# Patient Record
Sex: Female | Born: 1935 | Race: White | Hispanic: No | Smoking: Former smoker
Health system: Southern US, Community
[De-identification: ages and names within clinical notes are randomized; demographics above are authoritative.]

## PROBLEM LIST (undated history)

## (undated) DIAGNOSIS — E785 Hyperlipidemia, unspecified: Secondary | ICD-10-CM

## (undated) DIAGNOSIS — H409 Unspecified glaucoma: Secondary | ICD-10-CM

## (undated) DIAGNOSIS — H3530 Unspecified macular degeneration: Secondary | ICD-10-CM

## (undated) DIAGNOSIS — M1990 Unspecified osteoarthritis, unspecified site: Secondary | ICD-10-CM

## (undated) DIAGNOSIS — I5032 Chronic diastolic (congestive) heart failure: Secondary | ICD-10-CM

## (undated) DIAGNOSIS — M545 Low back pain, unspecified: Secondary | ICD-10-CM

## (undated) DIAGNOSIS — I2510 Atherosclerotic heart disease of native coronary artery without angina pectoris: Secondary | ICD-10-CM

## (undated) DIAGNOSIS — I10 Essential (primary) hypertension: Secondary | ICD-10-CM

## (undated) DIAGNOSIS — R0602 Shortness of breath: Secondary | ICD-10-CM

## (undated) DIAGNOSIS — K5792 Diverticulitis of intestine, part unspecified, without perforation or abscess without bleeding: Secondary | ICD-10-CM

## (undated) DIAGNOSIS — Z9221 Personal history of antineoplastic chemotherapy: Secondary | ICD-10-CM

## (undated) DIAGNOSIS — J449 Chronic obstructive pulmonary disease, unspecified: Secondary | ICD-10-CM

## (undated) DIAGNOSIS — K5790 Diverticulosis of intestine, part unspecified, without perforation or abscess without bleeding: Secondary | ICD-10-CM

## (undated) DIAGNOSIS — H269 Unspecified cataract: Secondary | ICD-10-CM

## (undated) DIAGNOSIS — E039 Hypothyroidism, unspecified: Secondary | ICD-10-CM

## (undated) DIAGNOSIS — I509 Heart failure, unspecified: Secondary | ICD-10-CM

## (undated) DIAGNOSIS — Z923 Personal history of irradiation: Secondary | ICD-10-CM

## (undated) DIAGNOSIS — F419 Anxiety disorder, unspecified: Secondary | ICD-10-CM

## (undated) DIAGNOSIS — R079 Chest pain, unspecified: Secondary | ICD-10-CM

## (undated) DIAGNOSIS — K219 Gastro-esophageal reflux disease without esophagitis: Secondary | ICD-10-CM

## (undated) DIAGNOSIS — G8929 Other chronic pain: Secondary | ICD-10-CM

## (undated) DIAGNOSIS — J189 Pneumonia, unspecified organism: Secondary | ICD-10-CM

## (undated) DIAGNOSIS — C50919 Malignant neoplasm of unspecified site of unspecified female breast: Secondary | ICD-10-CM

## (undated) DIAGNOSIS — Z8719 Personal history of other diseases of the digestive system: Secondary | ICD-10-CM

## (undated) DIAGNOSIS — Z9889 Other specified postprocedural states: Secondary | ICD-10-CM

## (undated) HISTORY — DX: Personal history of other diseases of the digestive system: Z87.19

## (undated) HISTORY — DX: Diverticulosis of intestine, part unspecified, without perforation or abscess without bleeding: K57.90

## (undated) HISTORY — DX: Personal history of irradiation: Z92.3

## (undated) HISTORY — PX: COLOSTOMY: SHX63

## (undated) HISTORY — PX: BILATERAL OOPHORECTOMY: SHX1221

## (undated) HISTORY — PX: CATARACT EXTRACTION W/ INTRAOCULAR LENS  IMPLANT, BILATERAL: SHX1307

## (undated) HISTORY — PX: TONSILLECTOMY: SUR1361

## (undated) HISTORY — PX: SHOULDER OPEN ROTATOR CUFF REPAIR: SHX2407

## (undated) HISTORY — DX: Unspecified osteoarthritis, unspecified site: M19.90

## (undated) HISTORY — DX: Diverticulitis of intestine, part unspecified, without perforation or abscess without bleeding: K57.92

## (undated) HISTORY — DX: Other specified postprocedural states: Z98.890

## (undated) HISTORY — DX: Essential (primary) hypertension: I10

## (undated) HISTORY — DX: Heart failure, unspecified: I50.9

## (undated) HISTORY — DX: Shortness of breath: R06.02

## (undated) HISTORY — DX: Unspecified glaucoma: H40.9

## (undated) HISTORY — PX: UPPER GASTROINTESTINAL ENDOSCOPY: SHX188

## (undated) HISTORY — DX: Unspecified cataract: H26.9

## (undated) HISTORY — DX: Hyperlipidemia, unspecified: E78.5

## (undated) HISTORY — PX: THYROIDECTOMY, PARTIAL: SHX18

## (undated) HISTORY — DX: Chronic diastolic (congestive) heart failure: I50.32

## (undated) HISTORY — PX: NERVE SURGERY: SHX1016

## (undated) HISTORY — PX: CATARACT EXTRACTION: SUR2

## (undated) HISTORY — DX: Atherosclerotic heart disease of native coronary artery without angina pectoris: I25.10

---

## 2003-10-01 ENCOUNTER — Observation Stay (HOSPITAL_COMMUNITY): Admission: RE | Admit: 2003-10-01 | Discharge: 2003-10-02 | Payer: Self-pay | Admitting: Orthopedic Surgery

## 2004-01-21 ENCOUNTER — Other Ambulatory Visit: Admission: RE | Admit: 2004-01-21 | Discharge: 2004-01-21 | Payer: Self-pay | Admitting: Obstetrics and Gynecology

## 2004-02-03 ENCOUNTER — Encounter: Admission: RE | Admit: 2004-02-03 | Discharge: 2004-02-03 | Payer: Self-pay | Admitting: Obstetrics and Gynecology

## 2004-03-15 ENCOUNTER — Observation Stay (HOSPITAL_COMMUNITY): Admission: RE | Admit: 2004-03-15 | Discharge: 2004-03-16 | Payer: Self-pay | Admitting: Obstetrics and Gynecology

## 2004-03-15 ENCOUNTER — Encounter (INDEPENDENT_AMBULATORY_CARE_PROVIDER_SITE_OTHER): Payer: Self-pay | Admitting: *Deleted

## 2005-01-20 ENCOUNTER — Encounter: Admission: RE | Admit: 2005-01-20 | Discharge: 2005-01-20 | Payer: Self-pay | Admitting: Gastroenterology

## 2005-01-26 ENCOUNTER — Encounter: Admission: RE | Admit: 2005-01-26 | Discharge: 2005-01-26 | Payer: Self-pay | Admitting: Gastroenterology

## 2005-04-10 ENCOUNTER — Other Ambulatory Visit: Admission: RE | Admit: 2005-04-10 | Discharge: 2005-04-10 | Payer: Self-pay | Admitting: Family Medicine

## 2005-08-30 ENCOUNTER — Ambulatory Visit (HOSPITAL_COMMUNITY): Admission: RE | Admit: 2005-08-30 | Discharge: 2005-08-30 | Payer: Self-pay | Admitting: Family Medicine

## 2005-11-11 ENCOUNTER — Emergency Department (HOSPITAL_COMMUNITY): Admission: EM | Admit: 2005-11-11 | Discharge: 2005-11-11 | Payer: Self-pay | Admitting: Emergency Medicine

## 2007-01-16 ENCOUNTER — Emergency Department (HOSPITAL_COMMUNITY): Admission: EM | Admit: 2007-01-16 | Discharge: 2007-01-16 | Payer: Self-pay | Admitting: Emergency Medicine

## 2007-02-28 ENCOUNTER — Ambulatory Visit: Payer: Self-pay | Admitting: Vascular Surgery

## 2007-02-28 ENCOUNTER — Ambulatory Visit (HOSPITAL_COMMUNITY): Admission: RE | Admit: 2007-02-28 | Discharge: 2007-02-28 | Payer: Self-pay | Admitting: Orthopedic Surgery

## 2008-01-23 ENCOUNTER — Encounter: Admission: RE | Admit: 2008-01-23 | Discharge: 2008-01-23 | Payer: Self-pay | Admitting: Gastroenterology

## 2008-09-10 ENCOUNTER — Encounter: Admission: RE | Admit: 2008-09-10 | Discharge: 2008-09-10 | Payer: Self-pay | Admitting: Neurology

## 2009-09-29 ENCOUNTER — Encounter: Admission: RE | Admit: 2009-09-29 | Discharge: 2009-09-29 | Payer: Self-pay | Admitting: Family Medicine

## 2010-01-07 ENCOUNTER — Encounter: Admission: RE | Admit: 2010-01-07 | Discharge: 2010-01-07 | Payer: Self-pay | Admitting: Family Medicine

## 2010-02-24 ENCOUNTER — Encounter: Admission: RE | Admit: 2010-02-24 | Discharge: 2010-02-24 | Payer: Self-pay | Admitting: Family Medicine

## 2010-04-20 ENCOUNTER — Encounter
Admission: RE | Admit: 2010-04-20 | Discharge: 2010-07-19 | Payer: Self-pay | Admitting: Physical Medicine & Rehabilitation

## 2010-04-21 ENCOUNTER — Ambulatory Visit: Payer: Self-pay | Admitting: Physical Medicine & Rehabilitation

## 2010-04-29 ENCOUNTER — Encounter
Admission: RE | Admit: 2010-04-29 | Discharge: 2010-05-03 | Payer: Self-pay | Source: Home / Self Care | Attending: Anesthesiology | Admitting: Anesthesiology

## 2010-05-03 ENCOUNTER — Ambulatory Visit: Payer: Self-pay | Admitting: Anesthesiology

## 2010-09-10 ENCOUNTER — Encounter: Payer: Self-pay | Admitting: Family Medicine

## 2010-11-29 ENCOUNTER — Other Ambulatory Visit: Payer: Self-pay | Admitting: Family Medicine

## 2010-11-29 DIAGNOSIS — M545 Low back pain, unspecified: Secondary | ICD-10-CM

## 2010-12-07 ENCOUNTER — Ambulatory Visit
Admission: RE | Admit: 2010-12-07 | Discharge: 2010-12-07 | Disposition: A | Discharge status: Home / Self Care | Payer: Medicare Other | Source: Ambulatory Visit | Attending: Family Medicine | Admitting: Family Medicine

## 2010-12-07 DIAGNOSIS — M545 Low back pain, unspecified: Secondary | ICD-10-CM

## 2011-01-06 NOTE — Op Note (Signed)
NAME:  Marissa Caldwell, Marissa Caldwell                        ACCOUNT NO.:  [id number]   MEDICAL RECORD NO.:  [id number]                   PATIENT TYPE:  INP   LOCATION:  9399                                 FACILITY:  [facility]   PHYSICIAN:  Huel Cote, M.D.              DATE OF BIRTH:  1932/07/19   DATE OF PROCEDURE:  DATE OF DISCHARGE:                                 OPERATIVE REPORT   PREOPERATIVE DIAGNOSES:  Complex right adnexal mass, which is enlarging.   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   PROCEDURE:  Diagnostic laparoscopy and mini-laparotomy with bilateral  salpingo-oophorectomy, and resection of mass.   SURGEON:  Huel Cote, M.D. with Dr. Zenaida Niece, M.D.   ANESTHESIA:  General.   FINDINGS:  The left ovary and tube were normal.  The right ovary was 4.0 to  5.0 cm, with a cystic structure which is adjacent to more solid ovarian  tissue.  This entire structure was removed intact and sent for frozen  pathology as stated.   ESTIMATED BLOOD LOSS:  Was 100 mL.   URINE OUTPUT:  Was 150 mL of clear urine.   IV FLUIDS:  Was 1500 mL in the operating room.   TO PATHOLOGY:  Frozen right adnexa sent, and benign.  Left adnexa appeared  normal and was sent to permanent pathology.   DESCRIPTION OF PROCEDURE:  The patient was taken to the operating room where  general anesthesia was obtained without difficulty.  She was then prepped  and draped in the normal sterile fashion in the dorsal lithotomy position.  A speculum was placed in the vagina and the cervix was grasped with a Hulka  tenaculum to provide for uterine manipulation.  The speculum was then  removed and attention turned to the patient's abdomen, where a small  infraumbilical incision was made with the scalpel and the Veress needle  introduced into the peritoneal cavity.  Intraperitoneal  placement was  confirmed by both aspiration and injection of normal saline.  A  pneumoperitoneum was then obtained by applying CO2 gas, with a normal  opening pressure of 3 to 6.  When the pneumoperitoneum was obtained, the  Veress needle was removed, and the 10/11 trocar placed within the umbilicus  site.  The camera was then introduced with the pelvis not able to be fully  visualized, given the very deep nature of the patient's cul-de-sac.  Therefore an additional 5 mm trocar site was placed just suprapubically in  the midline.  This was placed under direct visualization, and at the 10/11  port 0.25% Marcaine used to numb the skin locally prior to introduction.  With a blunt probe then placed through the 5 mm port, the left ovary and  tube were found to be normal.  The uterus had several small fibroids, but  itself was  very normal in size.  The right adnexa had a normal tube.  There  was a normal rim of more solid-appearing ovarian tissue which was then  adjacent to a large cystic structure measuring 4.0 to 5.0 cm, and hanging  down into the cul-de-sac itself.  At this point, given that the tissue was  obviously ovarian in nature and the complex nature of the cyst  characteristics on ultrasound, necessitating an intact removal, the decision  was made to proceed with a mini-laparotomy.  The 5 mm trocar site was  removed, and a small Pfannenstiel incision was then made through that port  site.  This was then carried through to the underlying layer of fascia by  sharp dissection and Bovie cautery.  The fascia was then nicked in the  midline and the incision was extended laterally with Mayo scissors.  The  inferior aspect was grasped and dissected off the underlying rectus muscles,  and in a similar fashion in the superior aspect was grasped and dissected  off the rectus muscles.  The rectus muscles were separated in the midline  and the peritoneal cavity entered easily, given the degree of   pneumoperitoneum still present.  This incision was then extended superiorly  and inferiorly, with careful attention to avoid the bowel and bladder.  A  small Balfour retractor was then placed through the incision, and the uterus  was grasped at its right cornu with a long curved Kelly clamp.  The right  adnexa was then elevated, and the infundibular pelvic ligament was isolated  by opening the round ligament with Bovie cautery.  The infundibular ligament  was then clamped with a Heaney clamp, and the utero-ovarian ligament also  clamped with a Heaney clamp.  These were transected with Mayo scissors.  The  adnexa was thus freed and passed off to pathology.  The free pedicles were  then secured with two sutures of #0 Vicryl and suture ligature, with  excellent hemostasis noted.  Attention was then turned to the patient's left, where in a similar fashion  the infundibular pelvic ligament was isolated and crossclamped with a Heaney  clamp.  This was then transected and secured with #0 Vicryl suture ligature.  The utero-ovarian ligament was also cross-clamped with a Heaney clamp in two  bites, and transected and suture ligated with suture ligatures with this.  The adnexa then handed off for final pathology.  All pedicles were inspected  and the pelvis was irrigated, with no active bleeding noted.  All  instruments and sponges were removed from the patient's abdomen.  The sponge  count was felt to be correct.  The subfascial planes were inspected and  found to be hemostatic with small areas of oozing, controlled with Bovie  cautery.  The fascia was then closed with #0 Vicryl in a running fashion.  The skin was closed with staples.  The umbilical port site was closed with  one interrupted suture of #0 Vicryl in a deep suture, and the skin was  closed with #3-0 Vicryl subcuticular stitch.  The sponge, lap and needle  counts were correct x2. The patient was taken to the recovery room, awake, and in  stable condition.                                               Huel Cote, M.D.  KR/MEDQ  D:  03/15/2004  T:  03/15/2004  Job:  [id number]

## 2011-01-06 NOTE — Op Note (Signed)
NAME:  Marissa Caldwell, Marissa Caldwell                        ACCOUNT NO.:  [id number]   MEDICAL RECORD NO.:  [id number]                   PATIENT TYPE:  AMB   LOCATION:  DAY                                  FACILITY:  [facility]   PHYSICIAN:  Ronald Caldwell. Darrelyn Hillock, M.D.             DATE OF BIRTH:  September 09, 1931   DATE OF PROCEDURE:  10/01/2003  DATE OF DISCHARGE:                                 OPERATIVE REPORT   SURGEON:  Georges Lynch. Darrelyn Hillock, M.D.   ASSISTANT:  Ebbie Ridge. Paitsel, P.Caldwell.   PREOPERATIVE DIAGNOSES:  Complete tear of the rotator cuff tendon on the  right with severe impingement syndrome on the right.   POSTOPERATIVE DIAGNOSES:  Complete tear of the rotator cuff tendon on the  right with severe impingement syndrome on the right.   OPERATION:  1. Partial acromionectomy and acromioplasty on the right shoulder.  2. Repair of the rotator cuff tendon utilizing the tissue mend graft for     repair of the tendon. Also utilized tissue mend graft for repair of the     proximal deltoid tendon.   The patient was taken back under general anesthesia as far as the procedure.  She first had an interscalene neck block. After the interscalene block she  was given Caldwell general anesthetic and routine orthopedic prep and drape of the  right shoulder was carried out. She was given 1 g of IV Ancef. An incision  was made over the acromion, carried down into the proximal deltoid. The  deltoid tendon was stripped from the acromion in the usual fashion. I then  protected the cuff and went down and did Caldwell partial acromionectomy and  acromioplasty in the usual fashion. I then excised the subdeltoid bursa. I  went down and noted she had Caldwell large central type tear of the rotator cuff. I  then utilized the bur to bur the undersurface of the cuff, that is not the  cuff itself but the bone. The lateral humeral head was burred.  Following  this, I then sutured my tissue mend graft in place to the proximal portion  of the rotator cuff  and laterally and medially. I then utilized two anchors  which were multitack anchors into the bone and sutured the distal part into  the bone under tension.  I then irrigated the wound out and then utilized  the tissue mend graft to repair the defect over the acromion in the deltoid  tendon. I then closed the remaining part of the deltoid muscle in the usual  fashion, subcu was closed with #0 Vicryl, skin with metal staples and  sterile Neosporin dressings applied and she was placed in the shoulder  immobilizer. The patient was taken to the operating room in satisfactory  condition.  Ronald Caldwell. Darrelyn Hillock, M.D.    RAG/MEDQ  D:  10/01/2003  T:  10/01/2003  Job:  [id number]

## 2011-01-06 NOTE — H&P (Signed)
NAME:  Marissa Caldwell, Marissa Caldwell                        ACCOUNT NO.:  [id number]   MEDICAL RECORD NO.:  [id number]                   PATIENT TYPE:  AMB   LOCATION:  SDC                                  FACILITY:  [facility]   PHYSICIAN:  Huel Cote, M.D.              DATE OF BIRTH:  May 28, 1932   DATE OF ADMISSION:  03/15/2004  DATE OF DISCHARGE:                                HISTORY & PHYSICAL   The patient is Caldwell 77 year old, G1, P1 who is scheduled to undergo Caldwell  diagnostic laparoscopy and possible laparotomy for Caldwell complex adnexal mass  which has grown in size over Caldwell period of 4-6 months from her last ultrasound  evaluation.  The patient recently moved from out of town and her physicians  in that location had been following Caldwell complex mass on the right adnexa with  serial ultrasounds which has remained stable over several years and on  followup ultrasound performed in June the mass compared to old records had  increased significantly in size to approximately 6 cm and did have some  septations and debris within it.  For this reason, the patient has been  counseled to proceed with surgical removal.   PAST MEDICAL HISTORY:  Hypothyroidism and hypercholesterolemia.   PAST SURGICAL HISTORY:  1. In 2003, she had Caldwell lipoma removed from her right buttock.  2. In 2005, she had rotator cuff surgery.   PAST GYNECOLOGICAL HISTORY:  There are no abnormal Pap smears.   PAST OBSTETRICAL HISTORY:  She had one vaginal delivery of Caldwell term infant.   MEDICATIONS:  Synthroid, Miacalcin and Pravachol.   PHYSICAL EXAMINATION:  GENERAL:  The patient is tall at 6 feet even, weight  is 159 pounds.  VITAL SIGNS:  Blood pressure 120/80.  CARDIAC:  Regular rate and rhythm.  LUNGS:  Clear.  ABDOMEN:  Soft and nontender.  PELVIC:  She has normal external genitalia with atrophic change noted. The  cervix has no lesions. Uterus is normal in size and the adnexa do not have  clear palpable masses.   The patient was  counseled as to the risks and benefits of proceeding with  surgery including bleeding and infection and possible damage to bowel and  bladder.  Given the complex nature and the size of the mass, the patient was  also counseled that we would perform Caldwell diagnostic laparoscopy and should the  mass appear ovarian in nature likely proceed with Caldwell mini laparotomy to  remove the mass intact given its complex nature on ultrasound.  If the mass  should be hydrosalpinx in origin then we would proceed laparoscopically.  The patient understands that given the  increase in size and complex nature of the mass, it is important to rule out  malignant disease and thus we will be sending Caldwell frozen specimen  intraoperatively. She does not equivocally state that she wishes [place] to  proceed with staging or further surgery  but states that she will leave that  decision up to the physician, myself.                                               Huel Cote, M.D.    KR/MEDQ  D:  03/10/2004  T:  03/10/2004  Job:  [id number]

## 2011-01-06 NOTE — Op Note (Signed)
NAME:  Marissa Caldwell, Marissa Caldwell                        ACCOUNT NO.:  [id number]   MEDICAL RECORD NO.:  [id number]                   PATIENT TYPE:  AMB   LOCATION:  DAY                                  [facility]ACILITY[facility]   PHYSICIAN:  Ronald Caldwell. Gioffre, M.D.             DATE OF BIRTH:  1932/07/08   DATE OF PROCEDURE:  DATE OF DISCHARGE:                                 OPERATIVE REPORT   Audio too short to transcribe (less than 5 seconds)                                               Ronald Caldwell. Darrelyn Hillock, M.D.    RAG/MEDQ  D:  10/01/2003  T:  10/01/2003  Job:  [id number]

## 2011-01-06 NOTE — Discharge Summary (Signed)
NAME:  Marissa Caldwell, Marissa Caldwell                        ACCOUNT NO.:  [id number]   MEDICAL RECORD NO.:  [id number]                   PATIENT TYPE:  OBV   LOCATION:  9307                                 FACILITY:  [facility]   PHYSICIAN:  Huel Cote, M.D.              DATE OF BIRTH:  10/20/1931   DATE OF ADMISSION:  03/15/2004  DATE OF DISCHARGE:  03/16/2004                                 DISCHARGE SUMMARY   DISCHARGE DIAGNOSES:  1. Complex right adnexal mass status post surgery.  2. Status post diagnostic laparoscopy and then mini laparotomy with     bilateral salpingo-oophorectomy.  3. Final pathology reveals a benign serous cystadenofibroma approximately 8     cm in size on the right ovary; left ovary was normal.   DISCHARGE MEDICATIONS:  1. Motrin 600 mg p.o. q.6h. p.r.n.  2. Percocet one to two tablets p.o. q.4h. p.r.n.  3. Synthroid.  4. Miacalcin.  5. Pravachol.   DISCHARGE FOLLOW-UP:  The patient is to follow up in the office on Friday,  March 18, 2004 for staple removal.   HOSPITAL COURSE:  Briefly, the patient was a 77 year old female who had a  complex right adnexal mass which was noted to be growing in size and  underwent a diagnostic laparoscopy which confirmed that this was ovarian in  nature.  She then was converted to a mini laparotomy to remove this mass  which was approximately 8 cm intact and this was sent for pathology which  returned benign.  The patient also had a concurrent left salpingo-  oophorectomy performed through the mini laparotomy incision.  She was then  admitted for routine postoperative care and did very well.  On postoperative  day #1 she was able to tolerate p.o. pain medications and p.o. intake and  was voiding normally and therefore by the evening was felt stable for  discharge home and was discharged home to follow up in the office in 2-3  days for staple removal.                                               Huel Cote, M.D.    KR/MEDQ   D:  04/07/2004  T:  04/08/2004  Job:  [id number]

## 2011-05-04 ENCOUNTER — Other Ambulatory Visit: Payer: Self-pay | Admitting: Family Medicine

## 2011-05-04 DIAGNOSIS — R160 Hepatomegaly, not elsewhere classified: Secondary | ICD-10-CM

## 2011-05-06 ENCOUNTER — Ambulatory Visit
Admission: RE | Admit: 2011-05-06 | Discharge: 2011-05-06 | Disposition: A | Discharge status: Home / Self Care | Payer: Medicare Other | Source: Ambulatory Visit | Attending: Family Medicine | Admitting: Family Medicine

## 2011-05-06 DIAGNOSIS — R160 Hepatomegaly, not elsewhere classified: Secondary | ICD-10-CM

## 2011-05-06 MED ORDER — GADOBENATE DIMEGLUMINE 529 MG/ML IV SOLN
15.0000 mL | Freq: Once | INTRAVENOUS | Status: AC | PRN
  Administered 2011-05-06: 15 mL via INTRAVENOUS

## 2014-09-29 ENCOUNTER — Other Ambulatory Visit: Payer: Self-pay | Admitting: Family Medicine

## 2014-09-29 DIAGNOSIS — M5136 Other intervertebral disc degeneration, lumbar region: Secondary | ICD-10-CM

## 2014-10-12 ENCOUNTER — Ambulatory Visit
Admission: RE | Admit: 2014-10-12 | Discharge: 2014-10-12 | Disposition: A | Discharge status: Home / Self Care | Payer: Medicare Other | Source: Ambulatory Visit | Attending: Family Medicine | Admitting: Family Medicine

## 2014-10-12 DIAGNOSIS — M5136 Other intervertebral disc degeneration, lumbar region: Secondary | ICD-10-CM

## 2015-05-07 ENCOUNTER — Other Ambulatory Visit: Payer: Self-pay | Admitting: Gastroenterology

## 2015-05-07 ENCOUNTER — Ambulatory Visit
Admission: RE | Admit: 2015-05-07 | Discharge: 2015-05-07 | Disposition: A | Discharge status: Home / Self Care | Payer: Medicare Other | Source: Ambulatory Visit | Attending: Gastroenterology | Admitting: Gastroenterology

## 2015-05-07 DIAGNOSIS — R1084 Generalized abdominal pain: Secondary | ICD-10-CM

## 2015-11-02 DIAGNOSIS — L989 Disorder of the skin and subcutaneous tissue, unspecified: Secondary | ICD-10-CM | POA: Diagnosis not present

## 2015-11-04 DIAGNOSIS — L570 Actinic keratosis: Secondary | ICD-10-CM | POA: Diagnosis not present

## 2015-12-03 DIAGNOSIS — M810 Age-related osteoporosis without current pathological fracture: Secondary | ICD-10-CM | POA: Diagnosis not present

## 2015-12-16 DIAGNOSIS — H61002 Unspecified perichondritis of left external ear: Secondary | ICD-10-CM | POA: Diagnosis not present

## 2015-12-16 DIAGNOSIS — D485 Neoplasm of uncertain behavior of skin: Secondary | ICD-10-CM | POA: Diagnosis not present

## 2015-12-24 DIAGNOSIS — H5310 Unspecified subjective visual disturbances: Secondary | ICD-10-CM | POA: Diagnosis not present

## 2015-12-24 DIAGNOSIS — H401331 Pigmentary glaucoma, bilateral, mild stage: Secondary | ICD-10-CM | POA: Diagnosis not present

## 2015-12-24 DIAGNOSIS — H18053 Posterior corneal pigmentations, bilateral: Secondary | ICD-10-CM | POA: Diagnosis not present

## 2015-12-24 DIAGNOSIS — H04123 Dry eye syndrome of bilateral lacrimal glands: Secondary | ICD-10-CM | POA: Diagnosis not present

## 2016-01-19 DIAGNOSIS — H61002 Unspecified perichondritis of left external ear: Secondary | ICD-10-CM | POA: Diagnosis not present

## 2016-02-07 DIAGNOSIS — H18053 Posterior corneal pigmentations, bilateral: Secondary | ICD-10-CM | POA: Diagnosis not present

## 2016-02-07 DIAGNOSIS — H401311 Pigmentary glaucoma, right eye, mild stage: Secondary | ICD-10-CM | POA: Diagnosis not present

## 2016-02-07 DIAGNOSIS — H04123 Dry eye syndrome of bilateral lacrimal glands: Secondary | ICD-10-CM | POA: Diagnosis not present

## 2016-02-07 DIAGNOSIS — H401321 Pigmentary glaucoma, left eye, mild stage: Secondary | ICD-10-CM | POA: Diagnosis not present

## 2016-04-26 DIAGNOSIS — H61002 Unspecified perichondritis of left external ear: Secondary | ICD-10-CM | POA: Diagnosis not present

## 2016-04-26 DIAGNOSIS — D485 Neoplasm of uncertain behavior of skin: Secondary | ICD-10-CM | POA: Diagnosis not present

## 2016-05-18 DIAGNOSIS — K219 Gastro-esophageal reflux disease without esophagitis: Secondary | ICD-10-CM | POA: Diagnosis not present

## 2016-05-18 DIAGNOSIS — E039 Hypothyroidism, unspecified: Secondary | ICD-10-CM | POA: Diagnosis not present

## 2016-05-18 DIAGNOSIS — Z23 Encounter for immunization: Secondary | ICD-10-CM | POA: Diagnosis not present

## 2016-05-18 DIAGNOSIS — F419 Anxiety disorder, unspecified: Secondary | ICD-10-CM | POA: Diagnosis not present

## 2016-05-18 DIAGNOSIS — J449 Chronic obstructive pulmonary disease, unspecified: Secondary | ICD-10-CM | POA: Diagnosis not present

## 2016-05-18 DIAGNOSIS — E559 Vitamin D deficiency, unspecified: Secondary | ICD-10-CM | POA: Diagnosis not present

## 2016-05-18 DIAGNOSIS — E785 Hyperlipidemia, unspecified: Secondary | ICD-10-CM | POA: Diagnosis not present

## 2016-05-18 DIAGNOSIS — M810 Age-related osteoporosis without current pathological fracture: Secondary | ICD-10-CM | POA: Diagnosis not present

## 2016-05-18 DIAGNOSIS — G4700 Insomnia, unspecified: Secondary | ICD-10-CM | POA: Diagnosis not present

## 2016-05-18 DIAGNOSIS — H9193 Unspecified hearing loss, bilateral: Secondary | ICD-10-CM | POA: Diagnosis not present

## 2016-07-10 DIAGNOSIS — H18053 Posterior corneal pigmentations, bilateral: Secondary | ICD-10-CM | POA: Diagnosis not present

## 2016-07-10 DIAGNOSIS — H401331 Pigmentary glaucoma, bilateral, mild stage: Secondary | ICD-10-CM | POA: Diagnosis not present

## 2016-07-10 DIAGNOSIS — H04123 Dry eye syndrome of bilateral lacrimal glands: Secondary | ICD-10-CM | POA: Diagnosis not present

## 2017-01-08 DIAGNOSIS — H401331 Pigmentary glaucoma, bilateral, mild stage: Secondary | ICD-10-CM | POA: Diagnosis not present

## 2017-01-08 DIAGNOSIS — J449 Chronic obstructive pulmonary disease, unspecified: Secondary | ICD-10-CM | POA: Diagnosis not present

## 2017-01-08 DIAGNOSIS — E441 Mild protein-calorie malnutrition: Secondary | ICD-10-CM | POA: Diagnosis not present

## 2017-01-08 DIAGNOSIS — E785 Hyperlipidemia, unspecified: Secondary | ICD-10-CM | POA: Diagnosis not present

## 2017-01-08 DIAGNOSIS — H04123 Dry eye syndrome of bilateral lacrimal glands: Secondary | ICD-10-CM | POA: Diagnosis not present

## 2017-01-08 DIAGNOSIS — F419 Anxiety disorder, unspecified: Secondary | ICD-10-CM | POA: Diagnosis not present

## 2017-02-11 DIAGNOSIS — J441 Chronic obstructive pulmonary disease with (acute) exacerbation: Secondary | ICD-10-CM | POA: Diagnosis not present

## 2017-02-11 DIAGNOSIS — R0602 Shortness of breath: Secondary | ICD-10-CM | POA: Diagnosis not present

## 2017-02-12 DIAGNOSIS — R938 Abnormal findings on diagnostic imaging of other specified body structures: Secondary | ICD-10-CM | POA: Diagnosis not present

## 2017-02-12 DIAGNOSIS — J449 Chronic obstructive pulmonary disease, unspecified: Secondary | ICD-10-CM | POA: Diagnosis not present

## 2017-02-12 DIAGNOSIS — R05 Cough: Secondary | ICD-10-CM | POA: Diagnosis not present

## 2017-02-12 DIAGNOSIS — I5020 Unspecified systolic (congestive) heart failure: Secondary | ICD-10-CM | POA: Diagnosis not present

## 2017-02-13 ENCOUNTER — Telehealth: Payer: Self-pay

## 2017-02-13 NOTE — Telephone Encounter (Signed)
SENT NOTES TO SCHEDULING 

## 2017-02-20 ENCOUNTER — Telehealth: Payer: Self-pay | Admitting: Cardiovascular Disease

## 2017-02-20 NOTE — Telephone Encounter (Addendum)
Received records from [place] for appointment on 03/07/17 with Dr Oval Linsey.  Records put with Dr Blenda Mounts schedule for  03/07/17. lp

## 2017-03-07 ENCOUNTER — Ambulatory Visit (INDEPENDENT_AMBULATORY_CARE_PROVIDER_SITE_OTHER): Payer: Medicare Other | Admitting: Cardiovascular Disease

## 2017-03-07 ENCOUNTER — Encounter: Payer: Self-pay | Admitting: Cardiovascular Disease

## 2017-03-07 VITALS — BP 132/84 | HR 56 | Ht 70.0 in | Wt 156.0 lb

## 2017-03-07 DIAGNOSIS — E038 Other specified hypothyroidism: Secondary | ICD-10-CM

## 2017-03-07 DIAGNOSIS — E7800 Pure hypercholesterolemia, unspecified: Secondary | ICD-10-CM

## 2017-03-07 DIAGNOSIS — R079 Chest pain, unspecified: Secondary | ICD-10-CM | POA: Diagnosis not present

## 2017-03-07 DIAGNOSIS — R0602 Shortness of breath: Secondary | ICD-10-CM | POA: Diagnosis not present

## 2017-03-07 DIAGNOSIS — I509 Heart failure, unspecified: Secondary | ICD-10-CM

## 2017-03-07 NOTE — Patient Instructions (Addendum)
Medication Instructions:  Your physician recommends that you continue on your current medications as directed. Please refer to the Current Medication list given to you today.  Labwork: NONE  Testing/Procedures: Your physician has requested that you have an echocardiogram. Echocardiography is a painless test that uses sound waves to create images of your heart. It provides your doctor with information about the size and shape of your heart and how well your heart's chambers and valves are working. This procedure takes approximately one hour. There are no restrictions for this procedure. [facility] has requested that you have a lexiscan myoview. For further information please visit [email or web address]. Please follow instruction sheet, as given.  Follow-Up: Your physician recommends that you schedule a follow-up appointment in: 2 MONTH OV  If you need a refill on your cardiac medications before your next appointment, please call your pharmacy.

## 2017-03-07 NOTE — Progress Notes (Signed)
Cardiology Office Note   Date:  03/11/2017   ID:  Marissa Caldwell, DOB 21-Oct-1931, MRN [id number]  PCP:  [name], MD  Cardiologist:   Skeet Latch, MD   Chief Complaint  Patient presents with  . New Patient (Initial Visit)      History of Present Illness: Marissa Caldwell is a 83 y.o. female hyperlipidemia, hypothyroidism, COPD, and deafness who is being seen today for the evaluation of heart failure at the request of [name], MD.  Marissa Caldwell saw Dr. Carol Ada on 02/12/17 and was noted to have lower extremity edema. She endorses orthopnea and shortness of breath.  BNP was reportedly elevated. Chest x-ray revealed hyperinflation with mild superimposed edema.  She was asked to start taking furosemide every day, which has helped.  She also reports episodes of non-productive cough and tightness in her chest.  These episodes last 3-4 hours and often occur when laying in bed.   She also endorses generalized fatigue.  She doesn't get much formal exercise but does walk her dog daily for a short distance.  She gets short of breath with exertion, especially when it is hot outside.  Marissa Caldwell' father had a heart attack at age 15 and her mother did at age 64.  She smokes cigarettes rarely, but hasn't smoked any in the last two months.     Past Medical History:  Diagnosis Date  . Heart failure, type unknown ([place]) 03/11/2017  . Hyperlipidemia 03/11/2017  . Shortness of breath 03/11/2017    No past surgical history on file.   Current Outpatient Prescriptions  Medication Sig Dispense Refill  . albuterol (PROVENTIL HFA;VENTOLIN HFA) 108 (90 Base) MCG/ACT inhaler Inhale 2 puffs into the lungs as directed.    Marland Kitchen alendronate (FOSAMAX) 70 MG tablet Take 70 mg by mouth once a week. Take with a full glass of water on an empty stomach.    Marland Kitchen atorvastatin (LIPITOR) 40 MG tablet Take 40 mg by mouth daily.    . benzonatate (TESSALON) 100 MG capsule Take 100 mg by mouth as directed.    .  budesonide-formoterol (SYMBICORT) 160-4.5 MCG/ACT inhaler Inhale 2 puffs into the lungs 2 (two) times daily.    . Calcium Carb-Cholecalciferol (CALCIUM + D3) 600-200 MG-UNIT TABS Take 1 tablet by mouth daily.    . Cholecalciferol 4000 units CAPS Take 1 capsule by mouth daily.    . Coenzyme Q10 (COQ10 PO) Take 1 capsule by mouth daily.    Marland Kitchen doxycycline (VIBRAMYCIN) 100 MG capsule Take 100 mg by mouth 2 (two) times daily.    Marland Kitchen levothyroxine (SYNTHROID, LEVOTHROID) 100 MCG tablet Take 100 mcg by mouth daily before breakfast.    . LORazepam (ATIVAN) 0.5 MG tablet Take by mouth. 1/2 to 1 tablet by mouth three times per day as needed for anxiety    . Multiple Vitamin (MULTIVITAMIN) tablet Take 1 tablet by mouth daily.    . multivitamin-lutein (OCUVITE-LUTEIN) CAPS capsule Take 1 capsule by mouth daily.    . Omega-3 Fatty Acids (FISH OIL) 1000 MG CAPS Take 1 capsule by mouth daily.    Marland Kitchen omeprazole (PRILOSEC) 20 MG capsule Take 20 mg by mouth daily.    . sertraline (ZOLOFT) 50 MG tablet Take 50 mg by mouth daily.     No current facility-administered medications for this visit.     Allergies:   Patient has no allergy information on record.    Social History:  The patient  reports that she  has quit smoking. She has never used smokeless tobacco.   Family History:  The patient's family history includes CAD in her mother; Coronary artery disease in her father; Leukemia in her sister.    ROS:  Please see the history of present illness.   Otherwise, review of systems are positive for sore legs.   All other systems are reviewed and negative.    PHYSICAL EXAM: VS:  BP 132/84   Pulse (!) 56   Ht 5\' 10"  (1.778 m)   Wt 70.8 kg (156 lb)   BMI 22.38 kg/m  , BMI Body mass index is 22.38 kg/m. GENERAL:  Well appearing HEENT:  Pupils equal round and reactive, fundi not visualized, oral mucosa unremarkable NECK:  No jugular venous distention, waveform within normal limits, carotid upstroke brisk and  symmetric, no bruits, no thyromegaly LYMPHATICS:  No cervical adenopathy LUNGS:  Clear to auscultation bilaterally HEART:  RRR.  PMI not displaced or sustained,S1 and S2 within normal limits, no S3, no S4, no clicks, no rubs, no murmurs ABD:  Flat, positive bowel sounds normal in frequency in pitch, no bruits, no rebound, no guarding, no midline pulsatile mass, no hepatomegaly, no splenomegaly EXT:  2 plus pulses throughout, no edema, no cyanosis no clubbing SKIN:  No rashes no nodules NEURO:  Cranial nerves II through XII grossly intact, motor grossly intact throughout PSYCH:  Cognitively intact, oriented to person place and time    EKG:  EKG is ordered today. The ekg ordered today demonstrates sinus bradycardia.  Rate 56 bpm.   06/10/12: Sinus rhythm. Rate 67 bpm.   Recent Labs: No results found for requested labs within last 8760 hours.   02/12/17: Sodium 135, potassium 3.5, UA and 20, creatinine 0.63 AST 25, ALT 21 Total cholesterol 196, triglycerides 79, HDL 78, LDL 102 TSH 3.38  Lipid Panel No results found for: CHOL, TRIG, HDL, CHOLHDL, VLDL, LDLCALC, LDLDIRECT    Wt Readings from Last 3 Encounters:  03/07/17 70.8 kg (156 lb)      ASSESSMENT AND PLAN:  # Chronic heart failure, type unknown:  Marissa Caldwell is euvolemic today.  Furosemide helped and she has not needed it daily.  Check echo.  # Atypical chest pain: Lexiscan Myoview   # Hyperlipidemia: Continue atorvastatin.  LDL 102 01/2017.  Current medicines are reviewed at length with the patient today.  The patient does not have concerns regarding medicines.  The following changes have been made:  no change  Labs/ tests ordered today include:   Orders Placed This Encounter  Procedures  . Myocardial Perfusion Imaging  . EKG 12-Lead  . ECHOCARDIOGRAM COMPLETE     Disposition:   FU with Shanetta Nicolls C. Oval Linsey, MD, [facility] in 2 months.    This note was written with the assistance of speech recognition software.   Please excuse any transcriptional errors.  Signed, Ahriana Gunkel C. Oval Linsey, MD, [facility]  03/11/2017 9:31 AM    [place]

## 2017-03-11 ENCOUNTER — Encounter: Payer: Self-pay | Admitting: Cardiovascular Disease

## 2017-03-11 DIAGNOSIS — E039 Hypothyroidism, unspecified: Secondary | ICD-10-CM | POA: Insufficient documentation

## 2017-03-11 DIAGNOSIS — R0602 Shortness of breath: Secondary | ICD-10-CM

## 2017-03-11 DIAGNOSIS — E785 Hyperlipidemia, unspecified: Secondary | ICD-10-CM

## 2017-03-11 DIAGNOSIS — I509 Heart failure, unspecified: Secondary | ICD-10-CM

## 2017-03-11 DIAGNOSIS — R0600 Dyspnea, unspecified: Secondary | ICD-10-CM | POA: Insufficient documentation

## 2017-03-11 HISTORY — DX: Shortness of breath: R06.02

## 2017-03-11 HISTORY — DX: Hyperlipidemia, unspecified: E78.5

## 2017-03-11 HISTORY — DX: Heart failure, unspecified: I50.9

## 2017-03-14 ENCOUNTER — Telehealth (HOSPITAL_COMMUNITY): Payer: Self-pay

## 2017-03-14 NOTE — Telephone Encounter (Signed)
Encounter complete. 

## 2017-03-15 ENCOUNTER — Other Ambulatory Visit: Payer: Self-pay

## 2017-03-15 ENCOUNTER — Telehealth (HOSPITAL_COMMUNITY): Payer: Self-pay

## 2017-03-15 ENCOUNTER — Ambulatory Visit (HOSPITAL_COMMUNITY): Payer: Medicare Other | Attending: Cardiology

## 2017-03-15 DIAGNOSIS — I340 Nonrheumatic mitral (valve) insufficiency: Secondary | ICD-10-CM | POA: Insufficient documentation

## 2017-03-15 DIAGNOSIS — I517 Cardiomegaly: Secondary | ICD-10-CM | POA: Insufficient documentation

## 2017-03-15 DIAGNOSIS — I509 Heart failure, unspecified: Secondary | ICD-10-CM | POA: Diagnosis present

## 2017-03-15 DIAGNOSIS — R0602 Shortness of breath: Secondary | ICD-10-CM

## 2017-03-15 DIAGNOSIS — R079 Chest pain, unspecified: Secondary | ICD-10-CM | POA: Diagnosis not present

## 2017-03-15 NOTE — Telephone Encounter (Signed)
Encounter complete. 

## 2017-03-16 ENCOUNTER — Ambulatory Visit (HOSPITAL_COMMUNITY)
Admission: RE | Admit: 2017-03-16 | Discharge: 2017-03-16 | Disposition: A | Discharge status: Home / Self Care | Payer: Medicare Other | Source: Ambulatory Visit | Attending: Cardiovascular Disease | Admitting: Cardiovascular Disease

## 2017-03-16 DIAGNOSIS — R0602 Shortness of breath: Secondary | ICD-10-CM | POA: Insufficient documentation

## 2017-03-16 DIAGNOSIS — R079 Chest pain, unspecified: Secondary | ICD-10-CM | POA: Diagnosis not present

## 2017-03-16 DIAGNOSIS — I509 Heart failure, unspecified: Secondary | ICD-10-CM | POA: Diagnosis not present

## 2017-03-16 LAB — MYOCARDIAL PERFUSION IMAGING
LV dias vol: 92 mL (ref 46–106)
LV sys vol: 25 mL
Peak HR: 97 {beats}/min
Rest HR: 62 {beats}/min
SDS: 4
SRS: 2
SSS: 6
TID: 0.92

## 2017-03-16 MED ORDER — REGADENOSON 0.4 MG/5ML IV SOLN
0.4000 mg | Freq: Once | INTRAVENOUS | Status: AC
  Administered 2017-03-16: 0.4 mg via INTRAVENOUS

## 2017-03-16 MED ORDER — TECHNETIUM TC 99M TETROFOSMIN IV KIT
10.8000 | PACK | Freq: Once | INTRAVENOUS | Status: AC | PRN
  Administered 2017-03-16: 10.8 via INTRAVENOUS
  Filled 2017-03-16: qty 11

## 2017-03-16 MED ORDER — TECHNETIUM TC 99M TETROFOSMIN IV KIT
30.9000 | PACK | Freq: Once | INTRAVENOUS | Status: AC | PRN
  Administered 2017-03-16: 30.9 via INTRAVENOUS
  Filled 2017-03-16: qty 31

## 2017-03-19 ENCOUNTER — Telehealth: Payer: Self-pay | Admitting: *Deleted

## 2017-03-19 ENCOUNTER — Other Ambulatory Visit: Payer: Self-pay | Admitting: Cardiovascular Disease

## 2017-03-19 DIAGNOSIS — Z01812 Encounter for preprocedural laboratory examination: Secondary | ICD-10-CM

## 2017-03-19 DIAGNOSIS — R0789 Other chest pain: Secondary | ICD-10-CM

## 2017-03-19 NOTE — Telephone Encounter (Signed)
Dr Oval Linsey spoke with patient regarding need for cath, patient agreeable She is scheduled 03/26/17 at 7:30 with Dr Tamala Julian, will have labs this week

## 2017-03-19 NOTE — Telephone Encounter (Signed)
-----   Message from Skeet Latch, MD sent at 03/19/2017  1:00 PM EDT ----- Stress test is abnormal.  Recommend left heart catheterization.  Attempted to call patient and left a voicemail.

## 2017-03-20 ENCOUNTER — Encounter: Payer: Self-pay | Admitting: *Deleted

## 2017-03-20 NOTE — Telephone Encounter (Signed)
Patient also needs to start Aspirin 81 mg daily  Did reschedule cath for Monday 8/6 arrive at 8:00 per patient request  Left message to call back

## 2017-03-20 NOTE — Telephone Encounter (Signed)
F/u message ° °Pt returning Rn call. Please call back to discuss  °

## 2017-03-21 DIAGNOSIS — R0789 Other chest pain: Secondary | ICD-10-CM | POA: Diagnosis not present

## 2017-03-21 DIAGNOSIS — Z01812 Encounter for preprocedural laboratory examination: Secondary | ICD-10-CM | POA: Diagnosis not present

## 2017-03-21 LAB — CBC WITH DIFFERENTIAL/PLATELET
Basophils Absolute: 0 10*3/uL (ref 0.0–0.2)
Basos: 1 %
EOS (ABSOLUTE): 0.1 10*3/uL (ref 0.0–0.4)
Eos: 3 %
Hematocrit: 38.1 % (ref 34.0–46.6)
Hemoglobin: 13.1 g/dL (ref 11.1–15.9)
Immature Grans (Abs): 0 10*3/uL (ref 0.0–0.1)
Immature Granulocytes: 0 %
Lymphocytes Absolute: 1.5 10*3/uL (ref 0.7–3.1)
Lymphs: 27 %
MCH: 31.3 pg (ref 26.6–33.0)
MCHC: 34.4 g/dL (ref 31.5–35.7)
MCV: 91 fL (ref 79–97)
Monocytes Absolute: 0.5 10*3/uL (ref 0.1–0.9)
Monocytes: 9 %
Neutrophils Absolute: 3.5 10*3/uL (ref 1.4–7.0)
Neutrophils: 60 %
Platelets: 282 10*3/uL (ref 150–379)
RBC: 4.18 x10E6/uL (ref 3.77–5.28)
RDW: 14.5 % (ref 12.3–15.4)
WBC: 5.6 10*3/uL (ref 3.4–10.8)

## 2017-03-21 LAB — BASIC METABOLIC PANEL
BUN/Creatinine Ratio: 25 (ref 12–28)
BUN: 18 mg/dL (ref 8–27)
CO2: 22 mmol/L (ref 20–29)
Calcium: 10.1 mg/dL (ref 8.7–10.3)
Chloride: 97 mmol/L (ref 96–106)
Creatinine, Ser: 0.72 mg/dL (ref 0.57–1.00)
GFR calc Af Amer: 90 mL/min/{1.73_m2} (ref >59)
GFR calc non Af Amer: 78 mL/min/{1.73_m2} (ref >59)
Glucose: 133 mg/dL — ABNORMAL HIGH (ref 65–99)
Potassium: 4.5 mmol/L (ref 3.5–5.2)
Sodium: 138 mmol/L (ref 134–144)

## 2017-03-21 LAB — PROTIME-INR
INR: 1.1 (ref 0.8–1.2)
Prothrombin Time: 11 s (ref 9.1–12.0)

## 2017-03-21 LAB — APTT: aPTT: 28 s (ref 24–33)

## 2017-03-21 NOTE — Telephone Encounter (Signed)
Spoke with patient and reviewed instructions.  Letter at front desk for pick up when comes for labs today

## 2017-03-23 ENCOUNTER — Telehealth: Payer: Self-pay

## 2017-03-23 NOTE — Telephone Encounter (Signed)
Patient contacted pre-catheterization at [facility] scheduled for:  03/26/2017 @ 1030 Verified arrival time and place:  [facility] @ 0800 Confirmed AM meds to be taken pre-cath with sip of water: Take ASA prior to arrival Pt states she is not on a water pill Confirmed patient has responsible person to drive home post procedure and observe patient for 24 hours:  yes

## 2017-03-25 DIAGNOSIS — R9439 Abnormal result of other cardiovascular function study: Secondary | ICD-10-CM | POA: Diagnosis present

## 2017-03-26 ENCOUNTER — Encounter (HOSPITAL_COMMUNITY)
Admission: RE | Disposition: A | Discharge status: Home / Self Care | Payer: Self-pay | Source: Ambulatory Visit | Attending: Interventional Cardiology

## 2017-03-26 ENCOUNTER — Ambulatory Visit (HOSPITAL_COMMUNITY)
Admission: RE | Admit: 2017-03-26 | Discharge: 2017-03-27 | Disposition: A | Discharge status: Home / Self Care | Payer: Medicare Other | Source: Ambulatory Visit | Attending: Interventional Cardiology | Admitting: Interventional Cardiology

## 2017-03-26 ENCOUNTER — Encounter (HOSPITAL_COMMUNITY): Payer: Self-pay | Admitting: General Practice

## 2017-03-26 DIAGNOSIS — I25118 Atherosclerotic heart disease of native coronary artery with other forms of angina pectoris: Secondary | ICD-10-CM | POA: Insufficient documentation

## 2017-03-26 DIAGNOSIS — Z95820 Peripheral vascular angioplasty status with implants and grafts: Secondary | ICD-10-CM

## 2017-03-26 DIAGNOSIS — R9439 Abnormal result of other cardiovascular function study: Secondary | ICD-10-CM | POA: Diagnosis present

## 2017-03-26 DIAGNOSIS — I2510 Atherosclerotic heart disease of native coronary artery without angina pectoris: Secondary | ICD-10-CM | POA: Diagnosis present

## 2017-03-26 DIAGNOSIS — R0600 Dyspnea, unspecified: Secondary | ICD-10-CM | POA: Diagnosis present

## 2017-03-26 DIAGNOSIS — E039 Hypothyroidism, unspecified: Secondary | ICD-10-CM | POA: Diagnosis not present

## 2017-03-26 DIAGNOSIS — H9190 Unspecified hearing loss, unspecified ear: Secondary | ICD-10-CM | POA: Diagnosis not present

## 2017-03-26 DIAGNOSIS — Z79899 Other long term (current) drug therapy: Secondary | ICD-10-CM | POA: Insufficient documentation

## 2017-03-26 DIAGNOSIS — J449 Chronic obstructive pulmonary disease, unspecified: Secondary | ICD-10-CM | POA: Diagnosis not present

## 2017-03-26 DIAGNOSIS — E785 Hyperlipidemia, unspecified: Secondary | ICD-10-CM | POA: Diagnosis present

## 2017-03-26 DIAGNOSIS — Z7951 Long term (current) use of inhaled steroids: Secondary | ICD-10-CM | POA: Diagnosis not present

## 2017-03-26 DIAGNOSIS — I509 Heart failure, unspecified: Secondary | ICD-10-CM

## 2017-03-26 DIAGNOSIS — Z87891 Personal history of nicotine dependence: Secondary | ICD-10-CM | POA: Diagnosis not present

## 2017-03-26 DIAGNOSIS — Z955 Presence of coronary angioplasty implant and graft: Secondary | ICD-10-CM

## 2017-03-26 DIAGNOSIS — Z9861 Coronary angioplasty status: Secondary | ICD-10-CM | POA: Diagnosis present

## 2017-03-26 DIAGNOSIS — R079 Chest pain, unspecified: Secondary | ICD-10-CM | POA: Diagnosis present

## 2017-03-26 DIAGNOSIS — I5032 Chronic diastolic (congestive) heart failure: Secondary | ICD-10-CM | POA: Diagnosis not present

## 2017-03-26 DIAGNOSIS — R0602 Shortness of breath: Secondary | ICD-10-CM | POA: Diagnosis present

## 2017-03-26 HISTORY — DX: Low back pain: M54.5

## 2017-03-26 HISTORY — PX: LEFT HEART CATH AND CORONARY ANGIOGRAPHY: CATH118249

## 2017-03-26 HISTORY — DX: Pneumonia, unspecified organism: J18.9

## 2017-03-26 HISTORY — DX: Low back pain, unspecified: M54.50

## 2017-03-26 HISTORY — DX: Chronic obstructive pulmonary disease, unspecified: J44.9

## 2017-03-26 HISTORY — DX: Anxiety disorder, unspecified: F41.9

## 2017-03-26 HISTORY — DX: Other chronic pain: G89.29

## 2017-03-26 HISTORY — DX: Chest pain, unspecified: R07.9

## 2017-03-26 HISTORY — DX: Hypothyroidism, unspecified: E03.9

## 2017-03-26 HISTORY — PX: CORONARY ANGIOPLASTY WITH STENT PLACEMENT: SHX49

## 2017-03-26 HISTORY — DX: Gastro-esophageal reflux disease without esophagitis: K21.9

## 2017-03-26 HISTORY — DX: Atherosclerotic heart disease of native coronary artery without angina pectoris: I25.10

## 2017-03-26 LAB — TROPONIN I
Troponin I: <0.03 ng/mL (ref <0.03)
Troponin I: <0.03 ng/mL (ref <0.03)

## 2017-03-26 LAB — CBC
HCT: 36.3 % (ref 36.0–46.0)
Hemoglobin: 12 g/dL (ref 12.0–15.0)
MCH: 30.8 pg (ref 26.0–34.0)
MCHC: 33.1 g/dL (ref 30.0–36.0)
MCV: 93.1 fL (ref 78.0–100.0)
Platelets: 270 10*3/uL (ref 150–400)
RBC: 3.9 MIL/uL (ref 3.87–5.11)
RDW: 15.7 % — ABNORMAL HIGH (ref 11.5–15.5)
WBC: 5.6 10*3/uL (ref 4.0–10.5)

## 2017-03-26 LAB — CREATININE, SERUM
Creatinine, Ser: 0.61 mg/dL (ref 0.44–1.00)
GFR calc Af Amer: >60 mL/min (ref >60)
GFR calc non Af Amer: >60 mL/min (ref >60)

## 2017-03-26 LAB — POCT ACTIVATED CLOTTING TIME
Activated Clotting Time: 252 seconds
Activated Clotting Time: 307 seconds

## 2017-03-26 SURGERY — LEFT HEART CATH AND CORONARY ANGIOGRAPHY
Anesthesia: LOCAL

## 2017-03-26 MED ORDER — HEPARIN SODIUM (PORCINE) 5000 UNIT/ML IJ SOLN
5000.0000 [IU] | Freq: Three times a day (TID) | INTRAMUSCULAR | Status: DC
Start: 2017-03-26 — End: 2017-03-27
  Administered 2017-03-27: 5000 [IU] via SUBCUTANEOUS
  Filled 2017-03-26: qty 1

## 2017-03-26 MED ORDER — SODIUM CHLORIDE 0.9 % WEIGHT BASED INFUSION: 3.0000 mL/kg/h | INTRAVENOUS | Status: DC

## 2017-03-26 MED ORDER — LIDOCAINE HCL (PF) 1 % IJ SOLN
INTRAMUSCULAR | Status: DC | PRN
  Administered 2017-03-26: 2 mL

## 2017-03-26 MED ORDER — CLOPIDOGREL BISULFATE 300 MG PO TABS
ORAL_TABLET | ORAL | Status: AC
  Filled 2017-03-26: qty 2

## 2017-03-26 MED ORDER — HEPARIN (PORCINE) IN NACL 2-0.9 UNIT/ML-% IJ SOLN
INTRAMUSCULAR | Status: AC
  Filled 2017-03-26: qty 1000

## 2017-03-26 MED ORDER — MIDAZOLAM HCL 2 MG/2ML IJ SOLN
INTRAMUSCULAR | Status: DC | PRN
  Administered 2017-03-26: 1 mg via INTRAVENOUS

## 2017-03-26 MED ORDER — ENSURE ENLIVE PO LIQD
237.0000 mL | Freq: Two times a day (BID) | ORAL | Status: DC
  Administered 2017-03-27: 237 mL via ORAL
  Filled 2017-03-26 (×4): qty 237

## 2017-03-26 MED ORDER — CLOPIDOGREL BISULFATE 300 MG PO TABS
ORAL_TABLET | ORAL | Status: DC | PRN
  Administered 2017-03-26: 600 mg via ORAL

## 2017-03-26 MED ORDER — OXYCODONE-ACETAMINOPHEN 5-325 MG PO TABS
1.0000 | ORAL_TABLET | ORAL | Status: DC | PRN
  Administered 2017-03-26: 18:00:00 1 via ORAL
  Filled 2017-03-26: qty 1

## 2017-03-26 MED ORDER — HYDRALAZINE HCL 20 MG/ML IJ SOLN
10.0000 mg | Freq: Four times a day (QID) | INTRAMUSCULAR | Status: DC | PRN
  Administered 2017-03-26: 18:00:00 10 mg via INTRAVENOUS
  Filled 2017-03-26: qty 1

## 2017-03-26 MED ORDER — SODIUM CHLORIDE 0.9 % WEIGHT BASED INFUSION: 1.0000 mL/kg/h | INTRAVENOUS | Status: DC

## 2017-03-26 MED ORDER — ASPIRIN EC 81 MG PO TBEC: 81.0000 mg | DELAYED_RELEASE_TABLET | Freq: Every day | ORAL | Status: DC

## 2017-03-26 MED ORDER — FENTANYL CITRATE (PF) 100 MCG/2ML IJ SOLN
INTRAMUSCULAR | Status: AC
  Filled 2017-03-26: qty 2

## 2017-03-26 MED ORDER — IOPAMIDOL (ISOVUE-370) INJECTION 76%
INTRAVENOUS | Status: AC
  Filled 2017-03-26: qty 50

## 2017-03-26 MED ORDER — OMEGA-3-ACID ETHYL ESTERS 1 G PO CAPS
1.0000 g | ORAL_CAPSULE | Freq: Two times a day (BID) | ORAL | Status: DC
  Filled 2017-03-26: qty 1

## 2017-03-26 MED ORDER — LATANOPROST 0.005 % OP SOLN
1.0000 [drp] | Freq: Every day | OPHTHALMIC | Status: DC
  Filled 2017-03-26: qty 2.5

## 2017-03-26 MED ORDER — SODIUM CHLORIDE 0.9% FLUSH: 3.0000 mL | Freq: Two times a day (BID) | INTRAVENOUS | Status: DC

## 2017-03-26 MED ORDER — VERAPAMIL HCL 2.5 MG/ML IV SOLN
INTRAVENOUS | Status: AC
  Filled 2017-03-26: qty 2

## 2017-03-26 MED ORDER — ATORVASTATIN CALCIUM 40 MG PO TABS
40.0000 mg | ORAL_TABLET | Freq: Every day | ORAL | Status: DC
  Administered 2017-03-26: 17:00:00 40 mg via ORAL
  Filled 2017-03-26: qty 1

## 2017-03-26 MED ORDER — LUTEIN 6 MG PO CAPS: ORAL_CAPSULE | Freq: Every day | ORAL | Status: DC

## 2017-03-26 MED ORDER — DORZOLAMIDE HCL 2 % OP SOLN
1.0000 [drp] | Freq: Two times a day (BID) | OPHTHALMIC | Status: DC
  Filled 2017-03-26: qty 10

## 2017-03-26 MED ORDER — MIDAZOLAM HCL 2 MG/2ML IJ SOLN
INTRAMUSCULAR | Status: AC
  Filled 2017-03-26: qty 2

## 2017-03-26 MED ORDER — ASPIRIN 81 MG PO CHEW: 81.0000 mg | CHEWABLE_TABLET | Freq: Every day | ORAL | Status: DC

## 2017-03-26 MED ORDER — FENTANYL CITRATE (PF) 100 MCG/2ML IJ SOLN
INTRAMUSCULAR | Status: DC | PRN
  Administered 2017-03-26: 50 ug via INTRAVENOUS

## 2017-03-26 MED ORDER — CALCIUM CARBONATE-VITAMIN D 500-200 MG-UNIT PO TABS
1.0000 | ORAL_TABLET | Freq: Every day | ORAL | Status: DC
  Filled 2017-03-26 (×3): qty 1

## 2017-03-26 MED ORDER — HEPARIN SODIUM (PORCINE) 1000 UNIT/ML IJ SOLN
INTRAMUSCULAR | Status: AC
  Filled 2017-03-26: qty 1

## 2017-03-26 MED ORDER — SODIUM CHLORIDE 0.9% FLUSH: 3.0000 mL | INTRAVENOUS | Status: DC | PRN

## 2017-03-26 MED ORDER — IOPAMIDOL (ISOVUE-370) INJECTION 76%
INTRAVENOUS | Status: AC
  Filled 2017-03-26: qty 100

## 2017-03-26 MED ORDER — ONDANSETRON HCL 4 MG/2ML IJ SOLN: 4.0000 mg | Freq: Four times a day (QID) | INTRAMUSCULAR | Status: DC | PRN

## 2017-03-26 MED ORDER — SODIUM CHLORIDE 0.9 % IV SOLN: 250.0000 mL | INTRAVENOUS | Status: DC | PRN

## 2017-03-26 MED ORDER — ASPIRIN 81 MG PO CHEW: 81.0000 mg | CHEWABLE_TABLET | ORAL | Status: DC

## 2017-03-26 MED ORDER — SERTRALINE HCL 50 MG PO TABS: 50.0000 mg | ORAL_TABLET | Freq: Every day | ORAL | Status: DC | PRN

## 2017-03-26 MED ORDER — VERAPAMIL HCL 2.5 MG/ML IV SOLN
INTRAVENOUS | Status: DC | PRN
  Administered 2017-03-26: 14:00:00 via INTRA_ARTERIAL

## 2017-03-26 MED ORDER — LIDOCAINE HCL (PF) 1 % IJ SOLN
INTRAMUSCULAR | Status: AC
  Filled 2017-03-26: qty 30

## 2017-03-26 MED ORDER — OMEGA-3-ACID ETHYL ESTERS 1 G PO CAPS: 1.0000 g | ORAL_CAPSULE | Freq: Two times a day (BID) | ORAL | Status: DC

## 2017-03-26 MED ORDER — MOMETASONE FURO-FORMOTEROL FUM 200-5 MCG/ACT IN AERO
2.0000 | INHALATION_SPRAY | Freq: Two times a day (BID) | RESPIRATORY_TRACT | Status: DC
  Filled 2017-03-26 (×2): qty 8.8

## 2017-03-26 MED ORDER — NITROGLYCERIN 1 MG/10 ML FOR IR/CATH LAB
INTRA_ARTERIAL | Status: DC | PRN
  Administered 2017-03-26: 200 ug via INTRACORONARY

## 2017-03-26 MED ORDER — VITAMIN D 1000 UNITS PO TABS
4000.0000 [IU] | ORAL_TABLET | Freq: Every day | ORAL | Status: DC
  Administered 2017-03-26: 4000 [IU] via ORAL
  Filled 2017-03-26 (×3): qty 4

## 2017-03-26 MED ORDER — ANGIOPLASTY BOOK
Freq: Once | Status: AC
Start: 2017-03-26 — End: 2017-03-26
  Administered 2017-03-26: 21:00:00
  Filled 2017-03-26: qty 1

## 2017-03-26 MED ORDER — HEPARIN (PORCINE) IN NACL 2-0.9 UNIT/ML-% IJ SOLN
INTRAMUSCULAR | Status: DC | PRN
  Administered 2017-03-26: 14:00:00

## 2017-03-26 MED ORDER — ACTIVE PARTNERSHIP FOR HEALTH OF YOUR HEART BOOK
Freq: Once | Status: AC
  Administered 2017-03-26: 21:00:00
  Filled 2017-03-26: qty 1

## 2017-03-26 MED ORDER — HEPARIN SODIUM (PORCINE) 1000 UNIT/ML IJ SOLN
INTRAMUSCULAR | Status: DC | PRN
  Administered 2017-03-26: 1500 [IU] via INTRAVENOUS
  Administered 2017-03-26: 2500 [IU] via INTRAVENOUS
  Administered 2017-03-26: 3500 [IU] via INTRAVENOUS
  Administered 2017-03-26: 5000 [IU] via INTRAVENOUS

## 2017-03-26 MED ORDER — COQ10 100 MG PO CAPS: 100.0000 mg | ORAL_CAPSULE | Freq: Every day | ORAL | Status: DC

## 2017-03-26 MED ORDER — IOPAMIDOL (ISOVUE-370) INJECTION 76%
INTRAVENOUS | Status: DC | PRN
  Administered 2017-03-26: 225 mL via INTRAVENOUS

## 2017-03-26 MED ORDER — LEVOTHYROXINE SODIUM 100 MCG PO TABS: 100.0000 ug | ORAL_TABLET | Freq: Every day | ORAL | Status: DC

## 2017-03-26 MED ORDER — LORAZEPAM 0.5 MG PO TABS: 0.5000 mg | ORAL_TABLET | Freq: Every day | ORAL | Status: DC

## 2017-03-26 MED ORDER — SODIUM CHLORIDE 0.9 % IV SOLN
INTRAVENOUS | Status: AC
  Administered 2017-03-26: 16:00:00 via INTRAVENOUS

## 2017-03-26 MED ORDER — ACETAMINOPHEN 325 MG PO TABS
650.0000 mg | ORAL_TABLET | ORAL | Status: DC | PRN
  Administered 2017-03-26: 650 mg via ORAL
  Filled 2017-03-26: qty 2

## 2017-03-26 MED ORDER — PANTOPRAZOLE SODIUM 40 MG PO TBEC
40.0000 mg | DELAYED_RELEASE_TABLET | Freq: Every day | ORAL | Status: DC
  Administered 2017-03-26: 40 mg via ORAL
  Filled 2017-03-26: qty 1

## 2017-03-26 MED ORDER — ADULT MULTIVITAMIN W/MINERALS CH
1.0000 | ORAL_TABLET | Freq: Every day | ORAL | Status: DC
  Administered 2017-03-26: 17:00:00 1 via ORAL
  Filled 2017-03-26: qty 1

## 2017-03-26 MED ORDER — CLOPIDOGREL BISULFATE 75 MG PO TABS
75.0000 mg | ORAL_TABLET | Freq: Every day | ORAL | Status: DC
  Administered 2017-03-27: 08:00:00 75 mg via ORAL
  Filled 2017-03-26: qty 1

## 2017-03-26 MED ORDER — NITROGLYCERIN 1 MG/10 ML FOR IR/CATH LAB
INTRA_ARTERIAL | Status: AC
  Filled 2017-03-26: qty 10

## 2017-03-26 SURGICAL SUPPLY — 16 items
BALLN SAPPHIRE 2.5X12 (BALLOONS) ×2
BALLOON SAPPHIRE 2.5X12 (BALLOONS) ×1 IMPLANT
CATH 5FR JL3.5 JR4 ANG PIG MP (CATHETERS) ×2 IMPLANT
CATH VISTA GUIDE 6FR XB3 (CATHETERS) ×2 IMPLANT
DEVICE RAD COMP TR BAND LRG (VASCULAR PRODUCTS) ×2 IMPLANT
GLIDESHEATH SLEND A-KIT 6F 22G (SHEATH) ×2 IMPLANT
GUIDEWIRE INQWIRE 1.5J.035X260 (WIRE) ×1 IMPLANT
INQWIRE 1.5J .035X260CM (WIRE) ×2
KIT ENCORE 26 ADVANTAGE (KITS) ×2 IMPLANT
KIT HEART LEFT (KITS) ×2 IMPLANT
PACK CARDIAC CATHETERIZATION (CUSTOM PROCEDURE TRAY) ×2 IMPLANT
STENT PROMUS PREM MR 3.0X16 (Permanent Stent) ×2 IMPLANT
TRANSDUCER W/STOPCOCK (MISCELLANEOUS) ×2 IMPLANT
TUBING CIL FLEX 10 FLL-RA (TUBING) ×2 IMPLANT
WIRE ASAHI PROWATER 180CM (WIRE) ×2 IMPLANT
WIRE HI TORQ VERSACORE-J 145CM (WIRE) ×2 IMPLANT

## 2017-03-26 NOTE — Interval H&P Note (Signed)
Cath Lab Visit (complete for each Cath Lab visit)  Clinical Evaluation Leading to the Procedure:   ACS: No.  Non-ACS:    Anginal Classification: CCS Caldwell  Anti-ischemic medical therapy: Minimal Therapy (1 class of medications)  Non-Invasive Test Results: High-risk stress test findings: cardiac mortality >3%/year  Prior CABG: No previous CABG      History and Physical Interval Note:  03/26/2017 1:38 PM  Marissa Caldwell  has presented today for surgery, with the diagnosis of abnormal myoview  The various methods of treatment have been discussed with the patient and family. After consideration of risks, benefits and other options for treatment, the patient has consented to  Procedure(s): Left Heart Cath and Coronary Angiography (N/A) as a surgical intervention .  The patient's history has been reviewed, patient examined, no change in status, stable for surgery.  I have reviewed the patient's chart and labs.  Questions were answered to the patient's satisfaction.     Marissa Caldwell

## 2017-03-26 NOTE — Care Management Note (Signed)
Case Management Note  Patient Details  Name: Marissa Caldwell MRN: [id number] Date of Birth: 03/22/1932  Subjective/Objective:  From home, s/p DES intervention, will be on plavix.                   Action/Plan: NCM will follow for dc needs.  Expected Discharge Date:  03/27/17               Expected Discharge Plan:  Home/Self Care  In-House Referral:     Discharge planning Services  CM Consult  Post Acute Care Choice:    Choice offered to:     DME Arranged:    DME Agency:     HH Arranged:    HH Agency:     Status of Service:  Completed, signed off  If discussed at [employer] of Stay Meetings, dates discussed:    Additional Comments:  Zenon Mayo, RN 03/26/2017, 3:48 PM

## 2017-03-26 NOTE — Research (Signed)
OPTIMIZE Informed Consent   Subject Name: Marissa Caldwell  Subject met inclusion and exclusion criteria.  The informed consent form, study requirements and expectations were reviewed with the subject and questions and concerns were addressed prior to the signing of the consent form.  The subject verbalized understanding of the trail requirements.  The subject agreed to participate in the OPTIMIZE trial and signed the informed consent.  The informed consent was obtained prior to performance of any protocol-specific procedures for the subject.  A copy of the signed informed consent was given to the subject and a copy was placed in the subject's medical record.  Hedrick,Fabion Gatson W 03/26/2017, [id number]

## 2017-03-26 NOTE — H&P (View-Only) (Signed)
Cardiology Office Note   Date:  03/11/2017   ID:  Marissa Caldwell, DOB Sep 18, 1931, MRN [id number]  PCP:  [name], MD  Cardiologist:   Skeet Latch, MD   Chief Complaint  Patient presents with  . New Patient (Initial Visit)      History of Present Illness: Marissa Caldwell is a 83 y.o. female hyperlipidemia, hypothyroidism, COPD, and deafness who is being seen today for the evaluation of heart failure at the request of [name], MD.  Marissa Caldwell saw Dr. Carol Ada on 02/12/17 and was noted to have lower extremity edema. She endorses orthopnea and shortness of breath.  BNP was reportedly elevated. Chest x-ray revealed hyperinflation with mild superimposed edema.  She was asked to start taking furosemide every day, which has helped.  She also reports episodes of non-productive cough and tightness in her chest.  These episodes last 3-4 hours and often occur when laying in bed.   She also endorses generalized fatigue.  She doesn't get much formal exercise but does walk her dog daily for a short distance.  She gets short of breath with exertion, especially when it is hot outside.  Marissa Caldwell had a heart attack at age 68 and her mother did at age 45.  She smokes cigarettes rarely, but hasn't smoked any in the last two months.     Past Medical History:  Diagnosis Date  . Heart failure, type unknown ([place]) 03/11/2017  . Hyperlipidemia 03/11/2017  . Shortness of breath 03/11/2017    No past surgical history on file.   Current Outpatient Prescriptions  Medication Sig Dispense Refill  . albuterol (PROVENTIL HFA;VENTOLIN HFA) 108 (90 Base) MCG/ACT inhaler Inhale 2 puffs into the lungs as directed.    Marland Kitchen alendronate (FOSAMAX) 70 MG tablet Take 70 mg by mouth once a week. Take with a full glass of water on an empty stomach.    Marland Kitchen atorvastatin (LIPITOR) 40 MG tablet Take 40 mg by mouth daily.    . benzonatate (TESSALON) 100 MG capsule Take 100 mg by mouth as directed.    .  budesonide-formoterol (SYMBICORT) 160-4.5 MCG/ACT inhaler Inhale 2 puffs into the lungs 2 (two) times daily.    . Calcium Carb-Cholecalciferol (CALCIUM + D3) 600-200 MG-UNIT TABS Take 1 tablet by mouth daily.    . Cholecalciferol 4000 units CAPS Take 1 capsule by mouth daily.    . Coenzyme Q10 (COQ10 PO) Take 1 capsule by mouth daily.    Marland Kitchen doxycycline (VIBRAMYCIN) 100 MG capsule Take 100 mg by mouth 2 (two) times daily.    Marland Kitchen levothyroxine (SYNTHROID, LEVOTHROID) 100 MCG tablet Take 100 mcg by mouth daily before breakfast.    . LORazepam (ATIVAN) 0.5 MG tablet Take by mouth. 1/2 to 1 tablet by mouth three times per day as needed for anxiety    . Multiple Vitamin (MULTIVITAMIN) tablet Take 1 tablet by mouth daily.    . multivitamin-lutein (OCUVITE-LUTEIN) CAPS capsule Take 1 capsule by mouth daily.    . Omega-3 Fatty Acids (FISH OIL) 1000 MG CAPS Take 1 capsule by mouth daily.    Marland Kitchen omeprazole (PRILOSEC) 20 MG capsule Take 20 mg by mouth daily.    . sertraline (ZOLOFT) 50 MG tablet Take 50 mg by mouth daily.     No current facility-administered medications for this visit.     Allergies:   Patient has no allergy information on record.    Social History:  The patient  reports that she  has quit smoking. She has never used smokeless tobacco.   Family History:  The patient's family history includes CAD in her mother; Coronary artery disease in her Caldwell; Leukemia in her sister.    ROS:  Please see the history of present illness.   Otherwise, review of systems are positive for sore legs.   All other systems are reviewed and negative.    PHYSICAL EXAM: VS:  BP 132/84   Pulse (!) 56   Ht 5\' 10"  (1.778 m)   Wt 70.8 kg (156 lb)   BMI 22.38 kg/m  , BMI Body mass index is 22.38 kg/m. GENERAL:  Well appearing HEENT:  Pupils equal round and reactive, fundi not visualized, oral mucosa unremarkable NECK:  No jugular venous distention, waveform within normal limits, carotid upstroke brisk and  symmetric, no bruits, no thyromegaly LYMPHATICS:  No cervical adenopathy LUNGS:  Clear to auscultation bilaterally HEART:  RRR.  PMI not displaced or sustained,S1 and S2 within normal limits, no S3, no S4, no clicks, no rubs, no murmurs ABD:  Flat, positive bowel sounds normal in frequency in pitch, no bruits, no rebound, no guarding, no midline pulsatile mass, no hepatomegaly, no splenomegaly EXT:  2 plus pulses throughout, no edema, no cyanosis no clubbing SKIN:  No rashes no nodules NEURO:  Cranial nerves II through XII grossly intact, motor grossly intact throughout PSYCH:  Cognitively intact, oriented to person place and time    EKG:  EKG is ordered today. The ekg ordered today demonstrates sinus bradycardia.  Rate 56 bpm.   06/10/12: Sinus rhythm. Rate 67 bpm.   Recent Labs: No results found for requested labs within last 8760 hours.   02/12/17: Sodium 135, potassium 3.5, UA and 20, creatinine 0.63 AST 25, ALT 21 Total cholesterol 196, triglycerides 79, HDL 78, LDL 102 TSH 3.38  Lipid Panel No results found for: CHOL, TRIG, HDL, CHOLHDL, VLDL, LDLCALC, LDLDIRECT    Wt Readings from Last 3 Encounters:  03/07/17 70.8 kg (156 lb)      ASSESSMENT AND PLAN:  # Chronic heart failure, type unknown:  Marissa Caldwell is euvolemic today.  Furosemide helped and she has not needed it daily.  Check echo.  # Atypical chest pain: Lexiscan Myoview   # Hyperlipidemia: Continue atorvastatin.  LDL 102 01/2017.  Current medicines are reviewed at length with the patient today.  The patient does not have concerns regarding medicines.  The following changes have been made:  no change  Labs/ tests ordered today include:   Orders Placed This Encounter  Procedures  . Myocardial Perfusion Imaging  . EKG 12-Lead  . ECHOCARDIOGRAM COMPLETE     Disposition:   FU with Toshiko Kemler C. Oval Linsey, MD, [facility] in 2 months.    This note was written with the assistance of speech recognition software.   Please excuse any transcriptional errors.  Signed, Pernell Dikes C. Oval Linsey, MD, [facility]  03/11/2017 9:31 AM    [place]

## 2017-03-27 ENCOUNTER — Encounter (HOSPITAL_COMMUNITY): Payer: Self-pay | Admitting: Interventional Cardiology

## 2017-03-27 DIAGNOSIS — E039 Hypothyroidism, unspecified: Secondary | ICD-10-CM | POA: Diagnosis not present

## 2017-03-27 DIAGNOSIS — H9190 Unspecified hearing loss, unspecified ear: Secondary | ICD-10-CM | POA: Diagnosis not present

## 2017-03-27 DIAGNOSIS — J449 Chronic obstructive pulmonary disease, unspecified: Secondary | ICD-10-CM | POA: Diagnosis not present

## 2017-03-27 DIAGNOSIS — Z7951 Long term (current) use of inhaled steroids: Secondary | ICD-10-CM | POA: Diagnosis not present

## 2017-03-27 DIAGNOSIS — I5032 Chronic diastolic (congestive) heart failure: Secondary | ICD-10-CM | POA: Diagnosis not present

## 2017-03-27 DIAGNOSIS — E7800 Pure hypercholesterolemia, unspecified: Secondary | ICD-10-CM

## 2017-03-27 DIAGNOSIS — R9439 Abnormal result of other cardiovascular function study: Secondary | ICD-10-CM | POA: Diagnosis not present

## 2017-03-27 DIAGNOSIS — Z87891 Personal history of nicotine dependence: Secondary | ICD-10-CM | POA: Diagnosis not present

## 2017-03-27 DIAGNOSIS — I25118 Atherosclerotic heart disease of native coronary artery with other forms of angina pectoris: Secondary | ICD-10-CM | POA: Diagnosis not present

## 2017-03-27 DIAGNOSIS — R079 Chest pain, unspecified: Secondary | ICD-10-CM | POA: Diagnosis present

## 2017-03-27 DIAGNOSIS — Z95820 Peripheral vascular angioplasty status with implants and grafts: Secondary | ICD-10-CM

## 2017-03-27 DIAGNOSIS — E785 Hyperlipidemia, unspecified: Secondary | ICD-10-CM | POA: Diagnosis not present

## 2017-03-27 DIAGNOSIS — Z79899 Other long term (current) drug therapy: Secondary | ICD-10-CM | POA: Diagnosis not present

## 2017-03-27 DIAGNOSIS — I2510 Atherosclerotic heart disease of native coronary artery without angina pectoris: Secondary | ICD-10-CM

## 2017-03-27 HISTORY — DX: Chest pain, unspecified: R07.9

## 2017-03-27 LAB — BASIC METABOLIC PANEL
Anion gap: 7 (ref 5–15)
BUN: 10 mg/dL (ref 6–20)
CO2: 23 mmol/L (ref 22–32)
Calcium: 8.7 mg/dL — ABNORMAL LOW (ref 8.9–10.3)
Chloride: 107 mmol/L (ref 101–111)
Creatinine, Ser: 0.65 mg/dL (ref 0.44–1.00)
GFR calc Af Amer: >60 mL/min (ref >60)
GFR calc non Af Amer: >60 mL/min (ref >60)
Glucose, Bld: 94 mg/dL (ref 65–99)
Potassium: 3.5 mmol/L (ref 3.5–5.1)
Sodium: 137 mmol/L (ref 135–145)

## 2017-03-27 LAB — TROPONIN I: Troponin I: <0.03 ng/mL (ref <0.03)

## 2017-03-27 MED ORDER — ATORVASTATIN CALCIUM 80 MG PO TABS: 80.0000 mg | ORAL_TABLET | Freq: Every day | ORAL | Status: DC

## 2017-03-27 MED ORDER — PANTOPRAZOLE SODIUM 40 MG PO TBEC: 40.0000 mg | DELAYED_RELEASE_TABLET | Freq: Every day | ORAL | 6 refills | Status: DC

## 2017-03-27 MED ORDER — ASPIRIN 81 MG PO CHEW: 81.0000 mg | CHEWABLE_TABLET | Freq: Every day | ORAL | Status: DC

## 2017-03-27 MED ORDER — NITROGLYCERIN 0.4 MG SL SUBL: 0.4000 mg | SUBLINGUAL_TABLET | SUBLINGUAL | 4 refills | Status: DC | PRN

## 2017-03-27 MED ORDER — CLOPIDOGREL BISULFATE 75 MG PO TABS: 75.0000 mg | ORAL_TABLET | Freq: Every day | ORAL | 11 refills | Status: DC

## 2017-03-27 MED ORDER — NITROGLYCERIN 0.4 MG SL SUBL: 0.4000 mg | SUBLINGUAL_TABLET | SUBLINGUAL | Status: DC | PRN

## 2017-03-27 MED ORDER — ATORVASTATIN CALCIUM 80 MG PO TABS: 80.0000 mg | ORAL_TABLET | Freq: Every day | ORAL | 6 refills | Status: DC

## 2017-03-27 NOTE — Progress Notes (Signed)
Progress Note  Patient Name: Marissa Caldwell Date of Encounter: 03/27/2017  Primary Cardiologist: Dr. Oval Linsey  Subjective   Feeling well. Anxious about needing to take many medications and wondering whether it will interact with her current medications. Denies chest pain or shortness of breath. Sinus rhythm. Occasional PVCs  Inpatient Medications    Scheduled Meds: . aspirin  81 mg Oral Daily  . atorvastatin  40 mg Oral Daily  . calcium-vitamin D  1 tablet Oral Daily  . cholecalciferol  4,000 Units Oral Daily  . clopidogrel  75 mg Oral Q breakfast  . dorzolamide  1 drop Both Eyes BID  . feeding supplement (ENSURE ENLIVE)  237 mL Oral BID BM  . heparin  5,000 Units Subcutaneous Q8H  . latanoprost  1 drop Both Eyes QHS  . levothyroxine  100 mcg Oral QAC breakfast  . LORazepam  0.5 mg Oral QHS  . mometasone-formoterol  2 puff Inhalation BID  . multivitamin with minerals  1 tablet Oral Daily  . omega-3 acid ethyl esters  1 g Oral BID  . pantoprazole  40 mg Oral Daily  . sodium chloride flush  3 mL Intravenous Q12H   Continuous Infusions: . sodium chloride     PRN Meds: sodium chloride, acetaminophen, hydrALAZINE, ondansetron (ZOFRAN) IV, oxyCODONE-acetaminophen, sertraline, sodium chloride flush   Vital Signs    Vitals:   03/26/17 2000 03/27/17 0202 03/27/17 0726 03/27/17 0845  BP:  (!) 157/70 (!) 153/76 139/64  Pulse: 65 69 (!) 58   Resp: 19 18 17  (!) 23  Temp:  (!) 97.5 F (36.4 C) 97.8 F (36.6 C)   TempSrc:  Oral Oral   SpO2: 98% 97% 95%   Weight:  72 kg (158 lb 11.7 oz)    Height:        Intake/Output Summary (Last 24 hours) at 03/27/17 0915 Last data filed at 03/27/17 0206  Gross per 24 hour  Intake              740 ml  Output             1100 ml  Net             -360 ml   Filed Weights   03/26/17 0744 03/27/17 0202  Weight: 71.7 kg (158 lb) 72 kg (158 lb 11.7 oz)    Telemetry     Sinus rhythm. PVCs.- Personally Reviewed  ECG    Sinus  rhythm. Rate 67 bpm. - Personally Reviewed  Physical Exam   VS:  BP 139/64   Pulse (!) 58   Temp 97.8 F (36.6 C) (Oral)   Resp (!) 23   Ht 5\' 10"  (1.778 m)   Wt 72 kg (158 lb 11.7 oz)   SpO2 95%   BMI 22.78 kg/m  , BMI Body mass index is 22.78 kg/m. GENERAL:  Well appearing. No acute distress HEENT: Pupils equal round and reactive, fundi not visualized, oral mucosa unremarkable NECK:  No jugular venous distention, waveform within normal limits, carotid upstroke brisk and symmetric, no bruits LUNGS:  Clear to auscultation bilaterally HEART:  RRR.  PMI not displaced or sustained,S1 and S2 within normal limits, no S3, no S4, no clicks, no rubs, no murmurs ABD:  Flat, positive bowel sounds normal in frequency in pitch, no bruits, no rebound, no guarding, no midline pulsatile mass, no hepatomegaly, no splenomegaly EXT:  2 plus pulses throughout, no edema, no cyanosis no clubbing.  Right radial site clean dry  and intact. SKIN:  No rashes no nodules NEURO:  Cranial nerves II through XII grossly intact, motor grossly intact throughout [facility]:  Cognitively intact, oriented to person place and time   Labs    Chemistry Recent Labs Lab 03/21/17 1038 03/26/17 1536 03/27/17 0453  NA 138  --  137  K 4.5  --  3.5  CL 97  --  107  CO2 22  --  23  GLUCOSE 133*  --  94  BUN 18  --  10  CREATININE 0.72 0.61 0.65  CALCIUM 10.1  --  8.7*  GFRNONAA 78 >60 >60  GFRAA 90 >60 >60  ANIONGAP  --   --  7     Hematology Recent Labs Lab 03/21/17 1038 03/26/17 1536  WBC 5.6 5.6  RBC 4.18 3.90  HGB 13.1 12.0  HCT 38.1 36.3  MCV 91 93.1  MCH 31.3 30.8  MCHC 34.4 33.1  RDW 14.5 15.7*  PLT 282 270    Cardiac Enzymes Recent Labs Lab 03/26/17 1410 03/26/17 1915 03/27/17 0453  TROPONINI <0.03 <0.03 <0.03   No results for input(s): TROPIPOC in the last 168 hours.   BNPNo results for input(s): BNP, PROBNP in the last 168 hours.   DDimer No results for input(s): DDIMER in the last  168 hours.   Radiology    No results found.  Cardiac Studies   Left heart catheterization 03/26/17:  Dominant circumflex with distal 95% stenosis beyond which an inferolateral wall obtuse marginal and the PDA arise.  Otherwise widely patent coronary arteries with marked tortuosity and angulation. Right coronary is nondominant.  Successful DES implantation reducing the 95% stenosis to 0% with TIMI grade 3 flow using a 3.0 x 15 mm study stent.  Chronic diastolic heart failure with LVEDP of 20 mmHg and normal overall systolic function with EF 60%.  RECOMMENDATIONS:   Plan discharge in a.m. on aspirin and Plavix.  Plavix should be continued for at least 6 months.  Risk factor modification.  Patient Profile     83 y.o. female with hyperlipidemia, hypothyroidism, COPD, and deafness here with abnormal stress test and found to have a 95% left circumflex lesion. She underwent successful PCI with a drug-eluting stent on 03/26/17.   Assessment & Plan    # CAD s/p PCI: Marissa Caldwell is doing well. She ambulated this morning without chest pain or shortness of breath. She will be on dual antiplatelet therapy for at least 6 months.  # Hyperlipidemia:  LDL was 102 on 01/2017. Her LDL should be less than 70.  We will increase atorvastatin to 80mg  daily.   Repeat lipids and CMP in 6 weeks.  Signed, Marissa Latch, MD  03/27/2017, 9:15 AM

## 2017-03-27 NOTE — Discharge Instructions (Signed)
Call [place] at [phone] if any bleeding, swelling or drainage at cath site.  May shower, no tub baths for 48 hours for groin sticks. No lifting over 5 pounds for 3 days.  No Driving for 3 days.  If you drive.  Heart Healthy low salt diet.  Do not stop Plavix or Asprin, stopping could cause a heart attack, these meds keep the stent in your coronary artery open.   Take 1 NTG, under your tongue, while sitting.  If no relief of pain may repeat NTG, one tab every 5 minutes up to 3 tablets total over 15 minutes.  If no relief CALL 911.  If you have dizziness/lightheadness  while taking NTG, stop taking and call 911.

## 2017-03-27 NOTE — Progress Notes (Signed)
CARDIAC REHAB PHASE I   PRE:  Rate/Rhythm: 71 SR  BP:  Supine: 139/64  Sitting:   Standing:    SaO2: 100%RA  MODE:  Ambulation: 250 ft   POST:  Rate/Rhythm: 88 SR  BP:  Supine:   Sitting: 149/72  Standing:    SaO2: 99%RA 0830-0915 Pt walked 250 ft on RA with hand held asst. No CP. To bed with alarm after walk. Discussed with pt how important it is to take plavix to keep stent open. Discussed NTG use, walking as tolerated, and watching sodium. Gave heart healthy and low sodium diets. Discussed CRP 2 and will refer to [place].    Graylon Good, RN BSN  03/27/2017 9:10 AM

## 2017-03-27 NOTE — Discharge Summary (Signed)
Discharge Summary    Patient ID: Marissa Caldwell,  MRN: [id number], DOB/AGE: Sep 11, 1931 83 y.o.  Admit date: 03/26/2017 Discharge date: 03/27/2017  Primary Care Provider: Harlan Caldwell Primary Cardiologist: Marissa Caldwell   Discharge Diagnoses    Principal Problem:   Abnormal nuclear stress test Active Problems:   Chest pain   S/P angioplasty with stent 03/26/17 DES to LCX, EF 60%   CAD (coronary artery disease), native coronary artery   Hyperlipidemia   Heart failure, type unknown ([facility])   Shortness of breath   Allergies No Known Allergies  Diagnostic Studies/Procedures    03/26/17  Procedures   Left Heart Cath and Coronary Angiography  Conclusion    Dominant circumflex with distal 95% stenosis beyond which an inferolateral wall obtuse marginal and the PDA arise.  Otherwise widely patent coronary arteries with marked tortuosity and angulation. Right coronary is nondominant.  Successful DES implantation reducing the 95% stenosis to 0% with TIMI grade 3 flow using a 3.0 x 15 mm study stent.  Chronic diastolic heart failure with LVEDP of 20 mmHg and normal overall systolic function with EF 60%.  RECOMMENDATIONS:   Plan discharge in a.m. on aspirin and Plavix.  Plavix should be continued for at least 6 months.  Risk factor modification.      _____________   History of Present Illness     83 y.o. female hyperlipidemia, hypothyroidism, COPD, and deafness who is being seen today for the evaluation of heart failure at the request of Marissa Caldwell.  [name] saw Marissa Caldwell on 02/12/17 and was noted to have lower extremity edema. She endorses orthopnea and shortness of breath.  BNP was reportedly elevated. Chest x-ray revealed hyperinflation with mild superimposed edema.  She was asked to start taking furosemide every day, which has helped.  She also reports episodes of non-productive cough and tightness in her chest.  These episodes last 3-4 hours and  often occur when laying in bed.   She also endorses generalized fatigue.  With these issues Marissa Caldwell did Echo with normal EF but G1DD.  Nuc study for her chest tightness with ischemia  Medium size, moderate intensity reversible (SDS 4) anterior perfusion defect.  Marissa Caldwell discussed cardiac cath for further eval and pt presented 03/26/17 for cardiac cath.     Hospital Course     Consultants: none   Pt underwent cardiac cath as above with stent to LCX.  She tolerated procedure well.  Today she has been seen and evaluated by Marissa Caldwell and found stable for discharge.  Her statin was increased for LDL 102 with goal of <70.   Pt ambulated with cardiac rehab and plans to attend phase II rehab.   Discharged with NTG.  NO BB at discharge on 52 YOF with HR to upper 50s on tele.  And COPD.  EF is normal.  Will decide if BP med is need as outpt.  Was controlled BP prior to procedure. _____________  Discharge Vitals Blood pressure (!) 141/66, pulse 73, temperature 97.8 F (36.6 C), temperature source Oral, resp. rate 19, height 5\' 10"  (1.778 m), weight 158 lb 11.7 oz (72 kg), SpO2 98 %.  Filed Weights   03/26/17 0744 03/27/17 0202  Weight: 158 lb (71.7 kg) 158 lb 11.7 oz (72 kg)    Labs & Radiologic Studies    CBC  Recent Labs  03/26/17 1536  WBC 5.6  HGB 12.0  HCT 36.3  MCV 93.1  PLT 270  Basic Metabolic Panel  Recent Labs  03/26/17 1536 03/27/17 0453  NA  --  137  K  --  3.5  CL  --  107  CO2  --  23  GLUCOSE  --  94  BUN  --  10  CREATININE 0.61 0.65  CALCIUM  --  8.7*   Liver Function Tests No results for input(s): AST, ALT, ALKPHOS, BILITOT, PROT, ALBUMIN in the last 72 hours. No results for input(s): LIPASE, AMYLASE in the last 72 hours. Cardiac Enzymes  Recent Labs  03/26/17 1410 03/26/17 1915 03/27/17 0453  TROPONINI <0.03 <0.03 <0.03   BNP Invalid input(s): POCBNP D-Dimer No results for input(s): DDIMER in the last 72 hours. Hemoglobin A1C No  results for input(s): HGBA1C in the last 72 hours. Fasting Lipid Panel No results for input(s): CHOL, HDL, LDLCALC, TRIG, CHOLHDL, LDLDIRECT in the last 72 hours. Thyroid Function Tests No results for input(s): TSH, T4TOTAL, T3FREE, THYROIDAB in the last 72 hours.  Invalid input(s): FREET3 _____________  No results found. Disposition   Pt is being discharged home today in good condition.  Follow-up Plans & Appointments   Call [facility] at [phone] if any bleeding, swelling or drainage at cath site.  May shower, no tub baths for 48 hours for groin sticks. No lifting over 5 pounds for 3 days.  No Driving for 3 days.  If you drive.  Heart Healthy low salt diet.  Do not stop Plavix or Asprin, stopping could cause a heart attack, these meds keep the stent in your coronary artery open.   Take 1 NTG, under your tongue, while sitting.  If no relief of pain may repeat NTG, one tab every 5 minutes up to 3 tablets total over 15 minutes.  If no relief CALL 911.  If you have dizziness/lightheadness  while taking NTG, stop taking and call 911.         Follow-up Information    Marissa Heritage, PA-C Follow up on 04/13/2017.   Specialties:  Physician Assistant, Cardiology Why:  at 10:30 AM with Dr. Blenda Mounts PA Contact information: [place] [address] [phone]        Marissa Latch, [place] Follow up on 05/08/2017.   Specialty:  Cardiology Why:  at 10:20 AM  Contact information: [address] [place] [address] [phone]          Discharge Instructions    Amb Referral to Cardiac Rehabilitation    Complete by:  As directed    Diagnosis:  Coronary Stents      Discharge Medications   Current Discharge Medication List    START taking these medications   Details  aspirin 81 MG chewable tablet Chew 1 tablet (81 mg total) by mouth daily.    clopidogrel (PLAVIX) 75 MG tablet Take 1 tablet (75 mg total)  by mouth daily with breakfast. Qty: 30 tablet, Refills: 11    nitroGLYCERIN (NITROSTAT) 0.4 MG SL tablet Place 1 tablet (0.4 mg total) under the tongue every 5 (five) minutes as needed for chest pain. Qty: 25 tablet, Refills: 4    pantoprazole (PROTONIX) 40 MG tablet Take 1 tablet (40 mg total) by mouth daily. Qty: 30 tablet, Refills: 6      CONTINUE these medications which have CHANGED   Details  atorvastatin (LIPITOR) 80 MG tablet Take 1 tablet (80 mg total) by mouth daily. Qty: 30 tablet, Refills: 6      CONTINUE these  medications which have NOT CHANGED   Details  albuterol (PROVENTIL HFA;VENTOLIN HFA) 108 (90 Base) MCG/ACT inhaler Inhale 2 puffs into the lungs every 6 (six) hours as needed (for shortness of breath/wheezing.).     alendronate (FOSAMAX) 70 MG tablet Take 70 mg by mouth once a week. Take with a full glass of water on an empty stomach.    budesonide-formoterol (SYMBICORT) 160-4.5 MCG/ACT inhaler Inhale 2 puffs into the lungs 2 (two) times daily.    Calcium Carb-Cholecalciferol (CALCIUM + D3) 600-200 MG-UNIT TABS Take 1 tablet by mouth daily.    Cholecalciferol (VITAMIN D3) 2000 units capsule Take 4,000 Units by mouth daily.    Coenzyme Q10 (COQ10) 100 MG CAPS Take 100 mg by mouth daily.    dorzolamide (TRUSOPT) 2 % ophthalmic solution Place 1 drop into both eyes 2 (two) times daily.    latanoprost (XALATAN) 0.005 % ophthalmic solution Place 1 drop into both eyes at bedtime.    levothyroxine (SYNTHROID, LEVOTHROID) 100 MCG tablet Take 100 mcg by mouth daily before breakfast.    LORazepam (ATIVAN) 0.5 MG tablet Take 0.5 mg by mouth at bedtime.     LUTEIN PO Take 1 tablet by mouth daily.    Multiple Vitamin (MULTIVITAMIN) tablet Take 1 tablet by mouth daily.    Omega-3 Fatty Acids (FISH OIL PO) Take 5 mLs by mouth daily.    sertraline (ZOLOFT) 50 MG tablet Take 50 mg by mouth daily as needed (for anxiety.).       STOP taking these medications     aspirin  EC 81 MG tablet      omeprazole (PRILOSEC) 20 MG capsule            Outstanding Labs/Studies   Lipids and CMP in 6 weeks on higher dose of statin.  Duration of Discharge Encounter   Greater than 30 minutes including physician time.  Signed, Cecilie Kicks NP 03/27/2017, 11:53 AM

## 2017-03-27 NOTE — Research (Signed)
OPTIMIZE Stent Research study: Generic STENT card and copy of ICF for the OPTIMIZE research study given to patient. Patient is BLINDED to STENT.Questions encouraged and answered. Review of follow up schedule given to patient.

## 2017-04-04 ENCOUNTER — Telehealth (HOSPITAL_COMMUNITY): Payer: Self-pay

## 2017-04-04 NOTE — Telephone Encounter (Signed)
Patient insurance is active and benefits verified. Patient insurance is [employer] - no co-payment, no deductible, out of pocket $5500/$662.65 has been met, no co-insurance and no pre-authorization. Passport/reference [phone].

## 2017-04-12 NOTE — Progress Notes (Signed)
Cardiology Office Note    Date:  04/13/2017   ID:  Marissa Caldwell, DOB 1932-04-10, MRN [id number]  PCP:  [name], MD  Cardiologist: Dr. Oval Linsey  Chief Complaint  Patient presents with  . Follow-up    s/p recent catheterization    History of Present Illness:    Marissa Caldwell is a 83 y.o. female with past medical history of HLD, GERD, COPD and hard of hearing who presents to the office today for follow-up of her recent cardiac catheterization.   She was evaluated by Dr. Oval Linsey in 02/2017 and reported having worsening fatigue and dyspnea on exertion. A stress test was obtained for initial evaluation and showed an anterior perfusion defect suggestive of ischemia, therefore a cardiac catheterization was recommended for definitive evaluation. This was performed on 03/26/2017 and showed 95% stenosis along the LCx. PCI was performed with placement of a DES. She was started on ASA and Plavix along with her Atorvastatin dosing being increased to 80mg  daily. Was not placed on BB therapy as she was noted to have a HR in the 50's while on telemetry.   In talking with the patient today, she denies any repeat episodes of chest discomfort and reports significant improvement in her fatigue following stent placement. She still has dyspnea on exertion which she equates to her COPD and denies any acute worsening of the symptoms. She denies any recent orthopnea, PND, or lower extremity edema. She reports good compliance with her medications including Aspirin and Plavix.  Of note, she does make mention that her dog scratched her left leg approximately one week ago, leaving a large wound. She has this covered in 3 large bandages and has been applying Neosporin daily. Reports continued drainage and erythema around the site. She did not contact her PCP about this.   Past Medical History:  Diagnosis Date  . Anxiety   . CAD (coronary artery disease)    a. 03/2017: 95% LCx stenosis --> PCI/DES placed   . Chest pain 03/27/2017  . Chronic lower back pain   . COPD (chronic obstructive pulmonary disease) ([place])   . Coronary artery disease   . GERD (gastroesophageal reflux disease)   . Heart failure, type unknown ([place]) 03/11/2017  . Hyperlipidemia 03/11/2017  . Hypothyroidism   . Pneumonia X 1   "years and years ago" (03/26/2017)  . Shortness of breath 03/11/2017    Past Surgical History:  Procedure Laterality Date  . BILATERAL OOPHORECTOMY Bilateral   . CATARACT EXTRACTION W/ INTRAOCULAR LENS  IMPLANT, BILATERAL Bilateral   . CORONARY ANGIOPLASTY WITH STENT PLACEMENT  03/26/2017  . LEFT HEART CATH AND CORONARY ANGIOGRAPHY N/A 03/26/2017   Procedure: Left Heart Cath and Coronary Angiography;  Surgeon: Belva Crome, MD;  Location: [facility];  Service: Cardiovascular;  Laterality: N/A;  . NERVE SURGERY     "lump on my sciatic nerve was removed years ago"  . SHOULDER OPEN ROTATOR CUFF REPAIR Right   . THYROIDECTOMY, PARTIAL      Current Medications: Outpatient Medications Prior to Visit  Medication Sig Dispense Refill  . albuterol (PROVENTIL HFA;VENTOLIN HFA) 108 (90 Base) MCG/ACT inhaler Inhale 2 puffs into the lungs every 6 (six) hours as needed (for shortness of breath/wheezing.).     Marland Kitchen alendronate (FOSAMAX) 70 MG tablet Take 70 mg by mouth once a week. Take with a full glass of water on an empty stomach.    Marland Kitchen aspirin 81 MG chewable tablet Chew 1 tablet (81 mg  total) by mouth daily.    Marland Kitchen atorvastatin (LIPITOR) 80 MG tablet Take 1 tablet (80 mg total) by mouth daily. 30 tablet 6  . budesonide-formoterol (SYMBICORT) 160-4.5 MCG/ACT inhaler Inhale 2 puffs into the lungs 2 (two) times daily.    . Calcium Carb-Cholecalciferol (CALCIUM + D3) 600-200 MG-UNIT TABS Take 1 tablet by mouth daily.    . Cholecalciferol (VITAMIN D3) 2000 units capsule Take 4,000 Units by mouth daily.    . clopidogrel (PLAVIX) 75 MG tablet Take 1 tablet (75 mg total) by mouth daily with breakfast. 30 tablet 11    . Coenzyme Q10 (COQ10) 100 MG CAPS Take 100 mg by mouth daily.    . dorzolamide (TRUSOPT) 2 % ophthalmic solution Place 1 drop into both eyes 2 (two) times daily.    Marland Kitchen latanoprost (XALATAN) 0.005 % ophthalmic solution Place 1 drop into both eyes at bedtime.    Marland Kitchen levothyroxine (SYNTHROID, LEVOTHROID) 100 MCG tablet Take 100 mcg by mouth daily before breakfast.    . LORazepam (ATIVAN) 0.5 MG tablet Take 0.5 mg by mouth at bedtime.     . LUTEIN PO Take 1 tablet by mouth daily.    . Multiple Vitamin (MULTIVITAMIN) tablet Take 1 tablet by mouth daily.    . nitroGLYCERIN (NITROSTAT) 0.4 MG SL tablet Place 1 tablet (0.4 mg total) under the tongue every 5 (five) minutes as needed for chest pain. 25 tablet 4  . Omega-3 Fatty Acids (FISH OIL PO) Take 5 mLs by mouth daily.    . pantoprazole (PROTONIX) 40 MG tablet Take 1 tablet (40 mg total) by mouth daily. 30 tablet 6  . sertraline (ZOLOFT) 50 MG tablet Take 50 mg by mouth daily as needed (for anxiety.).      No facility-administered medications prior to visit.      Allergies:   Patient has no known allergies.   Social History   Social History  . Marital status: Widowed    Spouse name: N/A  . Number of children: N/A  . Years of education: N/A   Social History Main Topics  . Smoking status: Former Smoker    Packs/day: 0.50    Years: 60.00    Types: Cigarettes    Quit date: 2016  . Smokeless tobacco: Never Used  . Alcohol use No  . Drug use: No  . Sexual activity: No   Other Topics Concern  . None   Social History Narrative  . None     Family History:  The patient's family history includes CAD in her mother; Coronary artery disease in her father; Leukemia in her sister.   Review of Systems:   Please see the history of present illness.     General:  No chills, fever, night sweats or weight changes. Positive for left leg wound.  Cardiovascular:  No chest pain, edema, orthopnea, palpitations, paroxysmal nocturnal dyspnea. Positive  for dyspnea on exertion.  Dermatological: No rash, lesions/masses Respiratory: No cough, dyspnea Urologic: No hematuria, dysuria Abdominal:   No nausea, vomiting, diarrhea, bright red blood per rectum, melena, or hematemesis Neurologic:  No visual changes, wkns, changes in mental status. All other systems reviewed and are otherwise negative except as noted above.   Physical Exam:    VS:  BP 108/62   Pulse 65   Ht 6' (1.829 m)   Wt 159 lb 3.2 oz (72.2 kg)   BMI 21.59 kg/m    General: Well developed, well nourished, elderly Caucasian female appearing in no acute distress.  Head: Normocephalic, atraumatic, sclera non-icteric, no xanthomas, nares are without discharge.  Neck: No carotid bruits. JVD not elevated.  Lungs: Respirations regular and unlabored, without wheezes or rales.  Heart: Regular rate and rhythm. No S3 or S4.  No murmur, no rubs, or gallops appreciated. Abdomen: Soft, non-tender, non-distended with normoactive bowel sounds. No hepatomegaly. No rebound/guarding. No obvious abdominal masses. Msk:  Strength and tone appear normal for age. No joint deformities or effusions. Extremities: No clubbing or cyanosis. No lower extremity edema.  Distal pedal pulses are 2+ bilaterally. Radial cath site without ecchymosis or evidence of a hematoma.  Neuro: Alert and oriented X 3. Moves all extremities spontaneously. No focal deficits noted. Psych:  Responds to questions appropriately with a normal affect. Skin:  Incision along tibial region of left leg, approximately 6 inches in length with active clear drainage. Erythema along border of the wound and warm to touch.   Wt Readings from Last 3 Encounters:  04/13/17 159 lb 3.2 oz (72.2 kg)  03/27/17 158 lb 11.7 oz (72 kg)  03/16/17 156 lb (70.8 kg)     Studies/Labs Reviewed:   EKG:  EKG is ordered today.  The ekg ordered today demonstrates NSR, HR 65, with no acute ST or T-wave changes.   Recent Labs: 03/26/2017: Hemoglobin 12.0;  Platelets 270 03/27/2017: BUN 10; Creatinine, Ser 0.65; Potassium 3.5; Sodium 137   Lipid Panel No results found for: CHOL, TRIG, HDL, CHOLHDL, VLDL, LDLCALC, LDLDIRECT  Additional studies/ records that were reviewed today include:   Cardiac Catheterization: 03/26/2017  Dominant circumflex with distal 95% stenosis beyond which an inferolateral wall obtuse marginal and the PDA arise.  Otherwise widely patent coronary arteries with marked tortuosity and angulation. Right coronary is nondominant.  Successful DES implantation reducing the 95% stenosis to 0% with TIMI grade 3 flow using a 3.0 x 15 mm study stent.  Chronic diastolic heart failure with LVEDP of 20 mmHg and normal overall systolic function with EF 60%.  RECOMMENDATIONS:   Plan discharge in a.m. on aspirin and Plavix.  Plavix should be continued for at least 6 months.  Risk factor modification.   Assessment:    1. Coronary artery disease involving native coronary artery of native heart without angina pectoris   2. Hyperlipidemia LDL goal <70   3. Medication management   4. Chronic obstructive pulmonary disease, unspecified COPD type ([place])   5. Leg wound, left, initial encounter      Plan:   In order of problems listed above:  1. CAD - recent cardiac catheterization on 03/26/2017 showed 95% stenosis along the LCx. PCI was performed with placement of a DES.  - she denies any recurrent chest pain or dyspnea on exertion. Has been able to perform ADL's independently without any exertional symptoms.  - continue ASA, Plavix, and Lipitor. Not on BB therapy secondary to history of baseline bradycardia.   2. HLD - Lipid Panel in 04/2016 showed an LDL of 102. Has been started on Lipitor 80mg  daily with goal LDL < 70 with known CAD. - she is not fasting today. Will return for fasting labs at the time of her appointment with Dr. Oval Linsey next month.   3. COPD - remains on Symbicort and Albuterol.   4. Left Leg Wound -  she reports her dog scratched her left leg approximately one week ago, leaving a large wound. Reports continued drainage and erythema around the site.  - on examination, the incision is approximately 6 inches long and is expressing  a clear drainage. The area around the wound is warm to touch and the borders are erythematous which is concerning for cellulitis.  - will start Keflex 500mg  QID for 7 days. We have called to arrange for close follow-up with her PCP on Monday morning.    Medication Adjustments/Labs and Tests Ordered: Current medicines are reviewed at length with the patient today.  Concerns regarding medicines are outlined above.  Medication changes, Labs and Tests ordered today are listed in the Patient Instructions below. Patient Instructions  Medication Instructions: START Keflex 500 mg four times daily for 7 days.  If you need a refill on your cardiac medications before your next appointment, please call your pharmacy.   Labwork: Your physician recommends that you return for lab work in: 2-4 weeks for fasting labs  Follow-Up: Please keep your appointemnt with Dr. Oval Linsey on 9/18 at 10:20.  Special Instructions:  You have a follow up appointment scheduled with Dr. Dema Severin at 10:15 on Monday 8/27.  Thank you for choosing Heartcare at [facility]!!      Signed, Erma Heritage, PA-C  04/13/2017 2:06 PM    [address], [place], [place]  [address] Phone: [phone]; Fax: [phone]  [address], [place], [place] [address] Phone: [phone]

## 2017-04-13 ENCOUNTER — Encounter: Payer: Self-pay | Admitting: Student

## 2017-04-13 ENCOUNTER — Ambulatory Visit (INDEPENDENT_AMBULATORY_CARE_PROVIDER_SITE_OTHER): Payer: Medicare Other | Admitting: Student

## 2017-04-13 VITALS — BP 108/62 | HR 65 | Ht 72.0 in | Wt 159.2 lb

## 2017-04-13 DIAGNOSIS — Z79899 Other long term (current) drug therapy: Secondary | ICD-10-CM

## 2017-04-13 DIAGNOSIS — J449 Chronic obstructive pulmonary disease, unspecified: Secondary | ICD-10-CM | POA: Diagnosis not present

## 2017-04-13 DIAGNOSIS — I2510 Atherosclerotic heart disease of native coronary artery without angina pectoris: Secondary | ICD-10-CM

## 2017-04-13 DIAGNOSIS — S81802A Unspecified open wound, left lower leg, initial encounter: Secondary | ICD-10-CM | POA: Diagnosis not present

## 2017-04-13 DIAGNOSIS — E785 Hyperlipidemia, unspecified: Secondary | ICD-10-CM

## 2017-04-13 MED ORDER — CEPHALEXIN 500 MG PO CAPS: 500.0000 mg | ORAL_CAPSULE | Freq: Four times a day (QID) | ORAL | 0 refills | Status: AC

## 2017-04-13 NOTE — Patient Instructions (Addendum)
Medication Instructions: START Keflex 500 mg four times daily for 7 days.  If you need a refill on your cardiac medications before your next appointment, please call your pharmacy.   Labwork: Your physician recommends that you return for lab work in: 2-4 weeks for fasting labs   Follow-Up: Please keep your appointemnt with Dr. Oval Linsey on 9/18 at 10:20.   Special Instructions:  You have a follow up appointment scheduled with Dr. Dema Severin at 10:15 on Monday 8/27.  Thank you for choosing Heartcare at [facility]!!

## 2017-04-16 DIAGNOSIS — L03116 Cellulitis of left lower limb: Secondary | ICD-10-CM | POA: Diagnosis not present

## 2017-04-16 DIAGNOSIS — S81812D Laceration without foreign body, left lower leg, subsequent encounter: Secondary | ICD-10-CM | POA: Diagnosis not present

## 2017-04-16 DIAGNOSIS — I2510 Atherosclerotic heart disease of native coronary artery without angina pectoris: Secondary | ICD-10-CM | POA: Diagnosis not present

## 2017-04-20 DIAGNOSIS — S81812D Laceration without foreign body, left lower leg, subsequent encounter: Secondary | ICD-10-CM | POA: Diagnosis not present

## 2017-04-20 DIAGNOSIS — L03116 Cellulitis of left lower limb: Secondary | ICD-10-CM | POA: Diagnosis not present

## 2017-04-20 DIAGNOSIS — R609 Edema, unspecified: Secondary | ICD-10-CM | POA: Diagnosis not present

## 2017-04-25 ENCOUNTER — Telehealth (HOSPITAL_COMMUNITY): Payer: Self-pay

## 2017-04-25 NOTE — Telephone Encounter (Signed)
I called patient to discuss scheduling for cardiac rehab. Patient declined due to walking everyday. Referral closed.

## 2017-05-08 ENCOUNTER — Ambulatory Visit: Payer: Medicare Other | Admitting: Cardiovascular Disease

## 2017-05-15 DIAGNOSIS — Z23 Encounter for immunization: Secondary | ICD-10-CM | POA: Diagnosis not present

## 2017-05-15 DIAGNOSIS — E039 Hypothyroidism, unspecified: Secondary | ICD-10-CM | POA: Diagnosis not present

## 2017-05-15 DIAGNOSIS — L03116 Cellulitis of left lower limb: Secondary | ICD-10-CM | POA: Diagnosis not present

## 2017-05-15 DIAGNOSIS — F419 Anxiety disorder, unspecified: Secondary | ICD-10-CM | POA: Diagnosis not present

## 2017-05-23 DIAGNOSIS — H01009 Unspecified blepharitis unspecified eye, unspecified eyelid: Secondary | ICD-10-CM | POA: Diagnosis not present

## 2017-05-23 DIAGNOSIS — H04123 Dry eye syndrome of bilateral lacrimal glands: Secondary | ICD-10-CM | POA: Diagnosis not present

## 2017-06-06 DIAGNOSIS — E7800 Pure hypercholesterolemia, unspecified: Secondary | ICD-10-CM | POA: Diagnosis not present

## 2017-06-06 DIAGNOSIS — Z79899 Other long term (current) drug therapy: Secondary | ICD-10-CM | POA: Diagnosis not present

## 2017-06-06 LAB — HEPATIC FUNCTION PANEL
ALT: 65 IU/L — ABNORMAL HIGH (ref 0–32)
AST: 58 IU/L — ABNORMAL HIGH (ref 0–40)
Albumin: 4 g/dL (ref 3.5–4.7)
Alkaline Phosphatase: 99 IU/L (ref 39–117)
Bilirubin Total: 0.4 mg/dL (ref 0.0–1.2)
Bilirubin, Direct: 0.12 mg/dL (ref 0.00–0.40)
Total Protein: 6.9 g/dL (ref 6.0–8.5)

## 2017-06-06 LAB — LIPID PANEL
Chol/HDL Ratio: 2.5 ratio (ref 0.0–4.4)
Cholesterol, Total: 147 mg/dL (ref 100–199)
HDL: 59 mg/dL (ref >39)
LDL Calculated: 71 mg/dL (ref 0–99)
Triglycerides: 85 mg/dL (ref 0–149)
VLDL Cholesterol Cal: 17 mg/dL (ref 5–40)

## 2017-06-08 ENCOUNTER — Telehealth: Payer: Self-pay | Admitting: *Deleted

## 2017-06-08 DIAGNOSIS — R945 Abnormal results of liver function studies: Principal | ICD-10-CM

## 2017-06-08 DIAGNOSIS — R7989 Other specified abnormal findings of blood chemistry: Secondary | ICD-10-CM

## 2017-06-08 NOTE — Telephone Encounter (Addendum)
Left detailed message for patient to have repeat lab work in 8 weeks. Lab orders mailed to the pt   ---- Message from Erma Heritage, PA-C sent at 06/08/2017  8:33 AM EDT -----  Thank you. Will just need repeat LFT's in 8 weeks.   ----- Message ----- From: Earvin Hansen Sent: 06/07/2017   6:36 PM To: Erma Heritage, PA-C  Advised patient of lab results. Confirmed that patient does not drink alcohol. Will forward to [place] to make sure no changes

## 2017-06-21 DIAGNOSIS — E039 Hypothyroidism, unspecified: Secondary | ICD-10-CM | POA: Diagnosis not present

## 2017-06-25 ENCOUNTER — Ambulatory Visit: Payer: Medicare Other | Admitting: Cardiovascular Disease

## 2017-06-25 ENCOUNTER — Encounter: Payer: Self-pay | Admitting: Cardiovascular Disease

## 2017-06-25 ENCOUNTER — Encounter (INDEPENDENT_AMBULATORY_CARE_PROVIDER_SITE_OTHER): Payer: Self-pay

## 2017-06-25 VITALS — BP 120/70 | HR 62 | Ht 72.0 in | Wt 160.0 lb

## 2017-06-25 DIAGNOSIS — E7800 Pure hypercholesterolemia, unspecified: Secondary | ICD-10-CM

## 2017-06-25 DIAGNOSIS — I2510 Atherosclerotic heart disease of native coronary artery without angina pectoris: Secondary | ICD-10-CM

## 2017-06-25 DIAGNOSIS — I5032 Chronic diastolic (congestive) heart failure: Secondary | ICD-10-CM | POA: Diagnosis not present

## 2017-06-25 DIAGNOSIS — Z955 Presence of coronary angioplasty implant and graft: Secondary | ICD-10-CM

## 2017-06-25 DIAGNOSIS — R7989 Other specified abnormal findings of blood chemistry: Secondary | ICD-10-CM

## 2017-06-25 DIAGNOSIS — R945 Abnormal results of liver function studies: Secondary | ICD-10-CM | POA: Diagnosis not present

## 2017-06-25 HISTORY — DX: Chronic diastolic (congestive) heart failure: I50.32

## 2017-06-25 NOTE — Progress Notes (Signed)
Cardiology Office Note   Date:  06/25/2017   ID:  [name], DOB Feb 09, 1932, MRN [id number]  PCP:  [name], MD  Cardiologist:   Skeet Latch, MD   No chief complaint on file.    History of Present Illness: Marissa Caldwell is a 83 y.o. female with CAD status post left circumflex PCI, chronic diastolic heart failure, hyperlipidemia, hypothyroidism, COPD, prior tobacco abuse, and deafness here for follow-up.  She was first seen 02/2017 for heart failure.  Her PCP noted edema, orthopnea, and PND.  BNP was elevated.  She  had an echo 03/15/17 that revealed LVEF 60-65% with grade 1 diastolic dysfunction and mild mitral regurgitation.  At that appointment she also reported atypical chest pain.  She was referred for a Lexiscan Myoview 03/16/17 that revealed an anterior perfusion defect.  LVEF was reportedly 73%.  She underwent left heart catheterization 03/26/17 and was found to have 95% stenosis of the distal left circumflex artery.  She underwent PCI with a drug-eluting stent and there were no complications.  She was started on atorvastatin  Marissa Caldwell followed up with Bernerd Pho, [place] on 8/24 and was doing well from a cardiac standpoint. However she had a leg wound from a dog scratch that was concerning for cellulitis.  She was started on Keflex.  She had LFTs 06/06/17 that were mildly elevated. She reports that she has been feeling well.  She has no chest pain but gets tired with exertion.  She doesn't get any formal exercise but tries to walk in her parking lot and at the grocery store.  She is otherwise without complaint today.    Past Medical History:  Diagnosis Date  . Anxiety   . CAD (coronary artery disease)    a. 03/2017: 95% LCx stenosis --> PCI/DES placed  . Chest pain 03/27/2017  . Chronic lower back pain   . COPD (chronic obstructive pulmonary disease) ([place])   . Coronary artery disease   . GERD (gastroesophageal reflux disease)   . Heart failure, type unknown  ([place]) 03/11/2017  . Hyperlipidemia 03/11/2017  . Hypothyroidism   . Pneumonia X 1   "years and years ago" (03/26/2017)  . Shortness of breath 03/11/2017    Past Surgical History:  Procedure Laterality Date  . BILATERAL OOPHORECTOMY Bilateral   . CATARACT EXTRACTION W/ INTRAOCULAR LENS  IMPLANT, BILATERAL Bilateral   . CORONARY ANGIOPLASTY WITH STENT PLACEMENT  03/26/2017  . NERVE SURGERY     "lump on my sciatic nerve was removed years ago"  . SHOULDER OPEN ROTATOR CUFF REPAIR Right   . THYROIDECTOMY, PARTIAL       Current Outpatient Medications  Medication Sig Dispense Refill  . albuterol (PROVENTIL HFA;VENTOLIN HFA) 108 (90 Base) MCG/ACT inhaler Inhale 2 puffs into the lungs every 6 (six) hours as needed (for shortness of breath/wheezing.).     Marland Kitchen alendronate (FOSAMAX) 70 MG tablet Take 70 mg by mouth once a week. Take with a full glass of water on an empty stomach.    Marland Kitchen aspirin 81 MG chewable tablet Chew 1 tablet (81 mg total) by mouth daily.    Marland Kitchen atorvastatin (LIPITOR) 80 MG tablet Take 1 tablet (80 mg total) by mouth daily. 30 tablet 6  . budesonide-formoterol (SYMBICORT) 160-4.5 MCG/ACT inhaler Inhale 2 puffs into the lungs 2 (two) times daily.    . Calcium Carb-Cholecalciferol (CALCIUM + D3) 600-200 MG-UNIT TABS Take 1 tablet by mouth daily.    . Cholecalciferol (VITAMIN D3)  2000 units capsule Take 4,000 Units by mouth daily.    . clopidogrel (PLAVIX) 75 MG tablet Take 1 tablet (75 mg total) by mouth daily with breakfast. 30 tablet 11  . Coenzyme Q10 (COQ10) 100 MG CAPS Take 100 mg by mouth daily.    . dorzolamide (TRUSOPT) 2 % ophthalmic solution Place 1 drop into both eyes 2 (two) times daily.    Marland Kitchen latanoprost (XALATAN) 0.005 % ophthalmic solution Place 1 drop into both eyes at bedtime.    Marland Kitchen levothyroxine (SYNTHROID, LEVOTHROID) 112 MCG tablet Take 112 mcg daily before breakfast by mouth.    Marland Kitchen LORazepam (ATIVAN) 0.5 MG tablet Take 0.5 mg by mouth at bedtime.     . LUTEIN PO Take  1 tablet by mouth daily.    . Multiple Vitamin (MULTIVITAMIN) tablet Take 1 tablet by mouth daily.    . nitroGLYCERIN (NITROSTAT) 0.4 MG SL tablet Place 1 tablet (0.4 mg total) under the tongue every 5 (five) minutes as needed for chest pain. 25 tablet 4  . Omega-3 Fatty Acids (FISH OIL PO) Take 5 mLs by mouth daily.    . pantoprazole (PROTONIX) 40 MG tablet Take 1 tablet (40 mg total) by mouth daily. 30 tablet 6  . sertraline (ZOLOFT) 50 MG tablet Take 50 mg by mouth daily as needed (for anxiety.).      No current facility-administered medications for this visit.     Allergies:   Patient has no known allergies.    Social History:  The patient  reports that she quit smoking about 2 years ago. Her smoking use included cigarettes. She has a 30.00 pack-year smoking history. she has never used smokeless tobacco. She reports that she does not drink alcohol or use drugs.   Family History:  The patient's family history includes CAD in her mother; Coronary artery disease in her father; Leukemia in her sister.    ROS:  Please see the history of present illness.   Otherwise, review of systems are positive for sore legs.   All other systems are reviewed and negative.    PHYSICAL EXAM: VS:  BP 120/70   Pulse 62   Ht 6' (1.829 m)   Wt 72.6 kg (160 lb)   BMI 21.70 kg/m  , BMI Body mass index is 21.7 kg/m. GENERAL:  Well appearing.  No acute distress. HEENT:  Pupils equal round and reactive, fundi not visualized, oral mucosa unremarkable NECK:  No jugular venous distention, waveform within normal limits, carotid upstroke brisk and symmetric, no bruits, no thyromegaly LUNGS:  Clear to auscultation bilaterally HEART:  RRR.  PMI not displaced or sustained,S1 and S2 within normal limits, no S3, no S4, no clicks, no rubs, no murmurs ABD:  Flat, positive bowel sounds normal in frequency in pitch, no bruits, no rebound, no guarding, no midline pulsatile mass, no hepatomegaly, no splenomegaly EXT:  2  plus pulses throughout, no edema, no cyanosis no clubbing SKIN:  No rashes no nodules NEURO:  Cranial nerves II through XII grossly intact, motor grossly intact throughout PSYCH:  Cognitively intact, oriented to person place and time   EKG:  EKG is ordered today. The ekg ordered today demonstrates sinus bradycardia.  Rate 56 bpm.   06/10/12: Sinus rhythm. Rate 67 bpm.  Lexiscan Myoview 03/16/17:  The left ventricular ejection fraction is hyperdynamic (>65%).  Nuclear stress EF: 73%.  No T wave inversion was noted during stress.  There was no ST segment deviation noted during stress.  Defect 1:  There is a medium defect of moderate severity.  Findings consistent with ischemia.  This is an intermediate risk study.   Medium size, moderate intensity reversible (SDS 4) anterior perfusion defect suggestive of ischemia. LVEF 73% with normal wall motion. This is an intermediate risk study.  Echo 03/15/17: Study Conclusions  - Left ventricle: The cavity size was normal. There was severe   focal basal and mild concentric hypertrophy. Systolic function   was normal. The estimated ejection fraction was in the range of   60% to 65%. Wall motion was normal; there were no regional wall   motion abnormalities. There was an increased relative   contribution of atrial contraction to ventricular filling.   Doppler parameters are consistent with abnormal left ventricular   relaxation (grade 1 diastolic dysfunction). Doppler parameters   are consistent with high ventricular filling pressure. - Aortic valve: There was trivial regurgitation. - Mitral valve: There was mild regurgitation. Valve area by   pressure half-time: 2.44 cm^2. - Pulmonic valve: There was mild regurgitation.  Left heart catheterization 03/26/17:  Dominant circumflex with distal 95% stenosis beyond which an inferolateral wall obtuse marginal and the PDA arise.  Otherwise widely patent coronary arteries with marked  tortuosity and angulation. Right coronary is nondominant.  Successful DES implantation reducing the 95% stenosis to 0% with TIMI grade 3 flow using a 3.0 x 15 mm study stent.  Chronic diastolic heart failure with LVEDP of 20 mmHg and normal overall systolic function with EF 60%.   Recent Labs: 03/26/2017: Hemoglobin 12.0; Platelets 270 03/27/2017: BUN 10; Creatinine, Ser 0.65; Potassium 3.5; Sodium 137 06/06/2017: ALT 65   02/12/17: Sodium 135, potassium 3.5, UA and 20, creatinine 0.63 AST 25, ALT 21 Total cholesterol 196, triglycerides 79, HDL 78, LDL 102 TSH 3.38  Lipid Panel    Component Value Date/Time   CHOL 147 06/06/2017 0859   TRIG 85 06/06/2017 0859   HDL 59 06/06/2017 0859   CHOLHDL 2.5 06/06/2017 0859   LDLCALC 71 06/06/2017 0859      Wt Readings from Last 3 Encounters:  06/25/17 72.6 kg (160 lb)  04/13/17 72.2 kg (159 lb 3.2 oz)  03/27/17 72 kg (158 lb 11.7 oz)      ASSESSMENT AND PLAN:  # CAD s/p LCx PCI:  Doing well without angina.  Continue aspirin and clopidogrel.Stop clopidogrel 03/2018.  She is not on a beta blocker 2/2 bradycardia.    # Chronic diastolic heart failure, type unknown:  Marissa Caldwell appears euvolemic.  She had elevated LVEDP on cath and grade 1 diastolic function on echo.  Consider adding furosemide.  # Hyperlipidemia: LFTs were mildly elevated.  Continue atorvastatin and repeat LFT in 2 months.  LDL 71 05/2017.  Current medicines are reviewed at length with the patient today.  The patient does not have concerns regarding medicines.  The following changes have been made:  no change  Labs/ tests ordered today include:   No orders of the defined types were placed in this encounter.    Disposition:   FU with Claudy Abdallah C. Oval Linsey, MD, [facility] in 03/2017.    This note was written with the assistance of speech recognition software.  Please excuse any transcriptional errors.  Signed, Azazel Franze C. Oval Linsey, MD, [facility]  06/25/2017 11:39 AM    [place]

## 2017-06-25 NOTE — Patient Instructions (Signed)
Medication Instructions:  Your physician recommends that you continue on your current medications as directed. Please refer to the Current Medication list given to you today.  Labwork: LIVER FUNCTIONS IN 2 MONTHS  Testing/Procedures: NONE  Follow-Up: Your physician wants you to follow-up in: AUGUST 2019 You will receive a reminder letter in the mail two months in advance. If you don't receive a letter, please call our office to schedule the follow-up appointment.  If you need a refill on your cardiac medications before your next appointment, please call your pharmacy.

## 2017-08-09 DIAGNOSIS — H401331 Pigmentary glaucoma, bilateral, mild stage: Secondary | ICD-10-CM | POA: Diagnosis not present

## 2017-08-09 DIAGNOSIS — Z961 Presence of intraocular lens: Secondary | ICD-10-CM | POA: Diagnosis not present

## 2017-08-09 DIAGNOSIS — H353132 Nonexudative age-related macular degeneration, bilateral, intermediate dry stage: Secondary | ICD-10-CM | POA: Diagnosis not present

## 2017-08-09 DIAGNOSIS — H01009 Unspecified blepharitis unspecified eye, unspecified eyelid: Secondary | ICD-10-CM | POA: Diagnosis not present

## 2017-08-28 ENCOUNTER — Other Ambulatory Visit: Payer: Self-pay | Admitting: *Deleted

## 2017-08-28 ENCOUNTER — Encounter: Payer: Self-pay | Admitting: *Deleted

## 2017-08-28 DIAGNOSIS — Z006 Encounter for examination for normal comparison and control in clinical research program: Secondary | ICD-10-CM

## 2017-08-28 DIAGNOSIS — R945 Abnormal results of liver function studies: Secondary | ICD-10-CM | POA: Diagnosis not present

## 2017-08-28 LAB — HEPATIC FUNCTION PANEL
ALT: 40 IU/L — ABNORMAL HIGH (ref 0–32)
AST: 41 IU/L — ABNORMAL HIGH (ref 0–40)
Albumin: 4.1 g/dL (ref 3.5–4.7)
Alkaline Phosphatase: 99 IU/L (ref 39–117)
Bilirubin Total: 0.4 mg/dL (ref 0.0–1.2)
Bilirubin, Direct: 0.14 mg/dL (ref 0.00–0.40)
Total Protein: 7.2 g/dL (ref 6.0–8.5)

## 2017-08-28 NOTE — Progress Notes (Signed)
[place] study month 6 telephone follow up completed. Patient denies any changes in her medication. She denies any angina however, states her arm tingles at times. She also states she feels the best she has ever felt. Next research required visit is due between 07/jul/19-5/sep/2019. She will need an EKG at that point.  I thanked her for her participation.

## 2017-09-05 ENCOUNTER — Telehealth: Payer: Self-pay | Admitting: *Deleted

## 2017-09-05 DIAGNOSIS — Z5181 Encounter for therapeutic drug level monitoring: Secondary | ICD-10-CM

## 2017-09-05 DIAGNOSIS — E785 Hyperlipidemia, unspecified: Secondary | ICD-10-CM

## 2017-09-05 MED ORDER — ROSUVASTATIN CALCIUM 20 MG PO TABS: 20.0000 mg | ORAL_TABLET | Freq: Every day | ORAL | 5 refills | Status: DC

## 2017-09-05 NOTE — Telephone Encounter (Addendum)
Advised patient of lab results, rx sent to [facility] and mailed lab order forms

## 2017-09-05 NOTE — Telephone Encounter (Signed)
-----   Message from Skeet Latch, MD sent at 09/03/2017  9:48 AM EST ----- Liver enzymes are mildly elevated but better than 2 months ago. Lets switch atorvastatin to rosuvastatin 20mg  and repeat lipids and LFTs in 6 weeks.

## 2017-09-06 DIAGNOSIS — H01009 Unspecified blepharitis unspecified eye, unspecified eyelid: Secondary | ICD-10-CM | POA: Diagnosis not present

## 2017-09-06 DIAGNOSIS — H401331 Pigmentary glaucoma, bilateral, mild stage: Secondary | ICD-10-CM | POA: Diagnosis not present

## 2017-09-28 ENCOUNTER — Encounter (HOSPITAL_COMMUNITY): Payer: Self-pay | Admitting: Family Medicine

## 2017-09-28 ENCOUNTER — Ambulatory Visit (HOSPITAL_COMMUNITY)
Admission: EM | Admit: 2017-09-28 | Discharge: 2017-09-28 | Disposition: A | Discharge status: Home / Self Care | Payer: Medicare Other | Attending: Internal Medicine | Admitting: Internal Medicine

## 2017-09-28 DIAGNOSIS — I2510 Atherosclerotic heart disease of native coronary artery without angina pectoris: Secondary | ICD-10-CM | POA: Insufficient documentation

## 2017-09-28 DIAGNOSIS — F419 Anxiety disorder, unspecified: Secondary | ICD-10-CM | POA: Diagnosis not present

## 2017-09-28 DIAGNOSIS — Z7982 Long term (current) use of aspirin: Secondary | ICD-10-CM | POA: Insufficient documentation

## 2017-09-28 DIAGNOSIS — N390 Urinary tract infection, site not specified: Secondary | ICD-10-CM | POA: Insufficient documentation

## 2017-09-28 DIAGNOSIS — Z79899 Other long term (current) drug therapy: Secondary | ICD-10-CM | POA: Diagnosis not present

## 2017-09-28 DIAGNOSIS — Z87891 Personal history of nicotine dependence: Secondary | ICD-10-CM | POA: Insufficient documentation

## 2017-09-28 DIAGNOSIS — R339 Retention of urine, unspecified: Secondary | ICD-10-CM

## 2017-09-28 DIAGNOSIS — E039 Hypothyroidism, unspecified: Secondary | ICD-10-CM | POA: Insufficient documentation

## 2017-09-28 DIAGNOSIS — I5032 Chronic diastolic (congestive) heart failure: Secondary | ICD-10-CM | POA: Insufficient documentation

## 2017-09-28 DIAGNOSIS — J449 Chronic obstructive pulmonary disease, unspecified: Secondary | ICD-10-CM | POA: Insufficient documentation

## 2017-09-28 DIAGNOSIS — E785 Hyperlipidemia, unspecified: Secondary | ICD-10-CM | POA: Insufficient documentation

## 2017-09-28 DIAGNOSIS — Z79890 Hormone replacement therapy (postmenopausal): Secondary | ICD-10-CM | POA: Diagnosis not present

## 2017-09-28 DIAGNOSIS — K219 Gastro-esophageal reflux disease without esophagitis: Secondary | ICD-10-CM | POA: Insufficient documentation

## 2017-09-28 LAB — POCT URINALYSIS DIP (DEVICE)
Bilirubin Urine: NEGATIVE
Glucose, UA: NEGATIVE mg/dL
Ketones, ur: NEGATIVE mg/dL
Nitrite: NEGATIVE
Protein, ur: NEGATIVE mg/dL
Specific Gravity, Urine: 1.01 (ref 1.005–1.030)
Urobilinogen, UA: 0.2 mg/dL (ref 0.0–1.0)
pH: 5.5 (ref 5.0–8.0)

## 2017-09-28 MED ORDER — CEPHALEXIN 500 MG PO CAPS: 500.0000 mg | ORAL_CAPSULE | Freq: Three times a day (TID) | ORAL | 0 refills | Status: AC

## 2017-09-28 NOTE — ED Triage Notes (Signed)
Pt here for dysuria and urinary retention since this am.

## 2017-09-28 NOTE — ED Provider Notes (Signed)
[place]    CSN: [id number] Arrival date & time: 09/28/17  1151     History   Chief Complaint Chief Complaint  Patient presents with  . Urinary Retention    HPI Marissa Caldwell is a 84 y.o. female.   84 year old female, with history of hyperlipidemia, CAD, COPD, GERD, anxiety, CHF, presenting today due to dysuria.  Symptoms started this morning.  States that she has had dysuria, urinary frequency and urgency.  She denies any fever, chills, nausea, vomiting, abdominal pain or flank pain.   The history is provided by the patient.  Dysuria  Pain quality:  Burning Pain severity:  Mild Onset quality:  Gradual Duration:  1 day Timing:  Constant Progression:  Unchanged Chronicity:  New Recent urinary tract infections: no   Relieved by:  Nothing Worsened by:  Nothing Ineffective treatments:  None tried Urinary symptoms: hesitancy   Urinary symptoms: no discolored urine, no foul-smelling urine, no frequent urination and no hematuria   Associated symptoms: no abdominal pain, no fever, no flank pain, no genital lesions, no nausea and no vomiting   Risk factors: no hx of pyelonephritis, no hx of urolithiasis, no kidney transplant, not pregnant, no recurrent urinary tract infections and no renal cysts     Past Medical History:  Diagnosis Date  . Anxiety   . CAD (coronary artery disease)    a. 03/2017: 95% LCx stenosis --> PCI/DES placed  . Chest pain 03/27/2017  . Chronic diastolic heart failure ([place]) 06/25/2017  . Chronic lower back pain   . COPD (chronic obstructive pulmonary disease) ([place])   . Coronary artery disease   . GERD (gastroesophageal reflux disease)   . Heart failure, type unknown ([place]) 03/11/2017  . Hyperlipidemia 03/11/2017  . Hypothyroidism   . Pneumonia X 1   "years and years ago" (03/26/2017)  . Shortness of breath 03/11/2017    Patient Active Problem List   Diagnosis Date Noted  . Chronic diastolic heart failure ([place]) 06/25/2017  . COPD  (chronic obstructive pulmonary disease) ([place]) 04/13/2017  . Chest pain 03/27/2017  . S/P angioplasty with stent 03/26/17 DES to LCX, EF 60% 03/27/2017  . CAD (coronary artery disease), native coronary artery 03/26/2017  . Abnormal nuclear stress test 03/25/2017  . Hyperlipidemia 03/11/2017  . Hypothyroidism 03/11/2017  . Shortness of breath 03/11/2017    Past Surgical History:  Procedure Laterality Date  . BILATERAL OOPHORECTOMY Bilateral   . CATARACT EXTRACTION W/ INTRAOCULAR LENS  IMPLANT, BILATERAL Bilateral   . CORONARY ANGIOPLASTY WITH STENT PLACEMENT  03/26/2017  . LEFT HEART CATH AND CORONARY ANGIOGRAPHY N/A 03/26/2017   Procedure: Left Heart Cath and Coronary Angiography;  Surgeon: Belva Crome, MD;  Location: [facility];  Service: Cardiovascular;  Laterality: N/A;  . NERVE SURGERY     "lump on my sciatic nerve was removed years ago"  . SHOULDER OPEN ROTATOR CUFF REPAIR Right   . THYROIDECTOMY, PARTIAL      OB History    No data available       Home Medications    Prior to Admission medications   Medication Sig Start Date End Date Taking? Authorizing Provider  albuterol (PROVENTIL HFA;VENTOLIN HFA) 108 (90 Base) MCG/ACT inhaler Inhale 2 puffs into the lungs every 6 (six) hours as needed (for shortness of breath/wheezing.).     [provider]  alendronate (FOSAMAX) 70 MG tablet Take 70 mg by mouth once a week. Take with a full glass of water on an  empty stomach.    [provider]  aspirin 81 MG chewable tablet Chew 1 tablet (81 mg total) by mouth daily. 03/28/17   Isaiah Serge, NP  budesonide-formoterol [facility]) 160-4.5 MCG/ACT inhaler Inhale 2 puffs into the lungs 2 (two) times daily.    [provider]  Calcium Carb-Cholecalciferol (CALCIUM + D3) 600-200 MG-UNIT TABS Take 1 tablet by mouth daily.    [provider]  cephALEXin (KEFLEX) 500 MG capsule Take 1 capsule (500 mg total) by mouth 3 (three) times daily for 7 days.  09/28/17 10/05/17  Ophelia Sipe, Belenda Cruise, PA-C  Cholecalciferol (VITAMIN D3) 2000 units capsule Take 4,000 Units by mouth daily.    [provider]  clopidogrel (PLAVIX) 75 MG tablet Take 1 tablet (75 mg total) by mouth daily with breakfast. 03/28/17   Isaiah Serge, NP  Coenzyme Q10 (COQ10) 100 MG CAPS Take 100 mg by mouth daily.    [provider]  dorzolamide (TRUSOPT) 2 % ophthalmic solution Place 1 drop into both eyes 2 (two) times daily.    [provider]  latanoprost (XALATAN) 0.005 % ophthalmic solution Place 1 drop into both eyes at bedtime.    [provider]  levothyroxine (SYNTHROID, LEVOTHROID) 112 MCG tablet Take 112 mcg daily before breakfast by mouth.    [provider]  LORazepam (ATIVAN) 0.5 MG tablet Take 0.5 mg by mouth at bedtime.     [provider]  LUTEIN PO Take 1 tablet by mouth daily.    [provider]  Multiple Vitamin (MULTIVITAMIN) tablet Take 1 tablet by mouth daily.    [provider]  nitroGLYCERIN (NITROSTAT) 0.4 MG SL tablet Place 1 tablet (0.4 mg total) under the tongue every 5 (five) minutes as needed for chest pain. 03/27/17   Isaiah Serge, NP  Omega-3 Fatty Acids (FISH OIL PO) Take 5 mLs by mouth daily.    [provider]  pantoprazole (PROTONIX) 40 MG tablet Take 1 tablet (40 mg total) by mouth daily. 03/28/17   Isaiah Serge, NP  rosuvastatin (CRESTOR) 20 MG tablet Take 1 tablet (20 mg total) by mouth daily. 09/05/17 12/04/17  Skeet Latch, MD  sertraline (ZOLOFT) 50 MG tablet Take 50 mg by mouth daily as needed (for anxiety.).     [provider]    Family History Family History  Problem Relation Age of Onset  . CAD Mother   . Coronary artery disease Father   . Leukemia Sister     Social History Social History   Tobacco Use  . Smoking status: Former Smoker    Packs/day: 0.50    Years: 60.00    Pack years: 30.00    Types: Cigarettes    Last attempt to quit:  2016    Years since quitting: 3.1  . Smokeless tobacco: Never Used  Substance Use Topics  . Alcohol use: No  . Drug use: No     Allergies   Atorvastatin   Review of Systems Review of Systems  Constitutional: Negative for chills and fever.  HENT: Negative for ear pain and sore throat.   Eyes: Negative for pain and visual disturbance.  Respiratory: Negative for cough and shortness of breath.   Cardiovascular: Negative for chest pain and palpitations.  Gastrointestinal: Negative for abdominal pain, nausea and vomiting.  Genitourinary: Positive for dysuria, frequency and urgency. Negative for flank pain and hematuria.  Musculoskeletal: Negative for arthralgias and back pain.  Skin: Negative for color change and rash.  Neurological: Negative for seizures and syncope.  All other systems reviewed and are negative.    Physical Exam Triage Vital Signs ED Triage Vitals [09/28/17 1300]  Enc Vitals Group     BP (!) 149/83     Pulse Rate 68     Resp 18     Temp 98.1 F (36.7 C)     Temp src      SpO2 97 %     Weight      Height      Head Circumference      Peak Flow      Pain Score      Pain Loc      Pain Edu?      Excl. in [place]?    No data found.  Updated Vital Signs BP (!) 149/83   Pulse 68   Temp 98.1 F (36.7 C)   Resp 18   SpO2 97%   Visual Acuity Right Eye Distance:   Left Eye Distance:   Bilateral Distance:    Right Eye Near:   Left Eye Near:    Bilateral Near:     Physical Exam  Constitutional: She appears well-developed and well-nourished. No distress.  HENT:  Head: Normocephalic and atraumatic.  Eyes: Conjunctivae are normal.  Neck: Neck supple.  Cardiovascular: Normal rate and regular rhythm.  No murmur heard. Pulmonary/Chest: Effort normal and breath sounds normal. No respiratory distress.  Abdominal: Soft. There is no tenderness.  Musculoskeletal: She exhibits no edema.  Neurological: She is alert.  Skin: Skin is warm and dry.    Psychiatric: She has a normal mood and affect.  Nursing note and vitals reviewed.    UC Treatments / Results  Labs (all labs ordered are listed, but only abnormal results are displayed) Labs Reviewed  POCT URINALYSIS DIP (DEVICE) - Abnormal; Notable for the following components:      Result Value   Hgb urine dipstick MODERATE (*)    Leukocytes, UA SMALL (*)    All other components within normal limits  URINE CULTURE    EKG  EKG Interpretation None       Radiology No results found.  Procedures Procedures (including critical care time)  Medications Ordered in UC Medications - No data to display   Initial Impression / Assessment and Plan / UC Course  I have reviewed the triage vital signs and the nursing notes.  Pertinent labs & imaging results that were available during my care of the patient were reviewed by me and considered in my medical decision making (see chart for details).     Dysuria, urgency and frequency.  No abdominal pain or flank pain.  No nausea, vomiting.  Do not suspect pyelonephritis.  Will treat with Keflex for urinary tract infection.  Final Clinical Impressions(s) / UC Diagnoses   Final diagnoses:  Acute UTI    ED Discharge Orders        Ordered    cephALEXin (KEFLEX) 500 MG capsule  3 times daily     09/28/17 1319       Controlled Substance Prescriptions [place] Controlled Substance Registry consulted? Not Applicable   Phebe Colla, [place] 09/28/17 1328

## 2017-09-30 LAB — URINE CULTURE: Culture: 50000 — AB

## 2017-10-09 ENCOUNTER — Other Ambulatory Visit: Payer: Self-pay | Admitting: Cardiology

## 2017-10-09 NOTE — Telephone Encounter (Signed)
Rx(s) sent to pharmacy electronically.  

## 2017-10-16 DIAGNOSIS — E785 Hyperlipidemia, unspecified: Secondary | ICD-10-CM | POA: Diagnosis not present

## 2017-10-16 DIAGNOSIS — Z5181 Encounter for therapeutic drug level monitoring: Secondary | ICD-10-CM | POA: Diagnosis not present

## 2017-10-16 LAB — COMPREHENSIVE METABOLIC PANEL
ALT: 147 IU/L — ABNORMAL HIGH (ref 0–32)
AST: 135 IU/L — ABNORMAL HIGH (ref 0–40)
Albumin/Globulin Ratio: 1.4 (ref 1.2–2.2)
Albumin: 4.2 g/dL (ref 3.5–4.7)
Alkaline Phosphatase: 87 IU/L (ref 39–117)
BUN/Creatinine Ratio: 19 (ref 12–28)
BUN: 14 mg/dL (ref 8–27)
Bilirubin Total: 0.2 mg/dL (ref 0.0–1.2)
CO2: 25 mmol/L (ref 20–29)
Calcium: 9.9 mg/dL (ref 8.7–10.3)
Chloride: 98 mmol/L (ref 96–106)
Creatinine, Ser: 0.73 mg/dL (ref 0.57–1.00)
GFR calc Af Amer: 87 mL/min/{1.73_m2} (ref >59)
GFR calc non Af Amer: 76 mL/min/{1.73_m2} (ref >59)
Globulin, Total: 2.9 g/dL (ref 1.5–4.5)
Glucose: 108 mg/dL — ABNORMAL HIGH (ref 65–99)
Potassium: 4.2 mmol/L (ref 3.5–5.2)
Sodium: 137 mmol/L (ref 134–144)
Total Protein: 7.1 g/dL (ref 6.0–8.5)

## 2017-10-16 LAB — LIPID PANEL
Chol/HDL Ratio: 1.9 ratio (ref 0.0–4.4)
Cholesterol, Total: 152 mg/dL (ref 100–199)
HDL: 79 mg/dL (ref >39)
LDL Calculated: 62 mg/dL (ref 0–99)
Triglycerides: 55 mg/dL (ref 0–149)
VLDL Cholesterol Cal: 11 mg/dL (ref 5–40)

## 2017-10-17 ENCOUNTER — Telehealth: Payer: Self-pay | Admitting: *Deleted

## 2017-10-17 DIAGNOSIS — R7989 Other specified abnormal findings of blood chemistry: Secondary | ICD-10-CM

## 2017-10-17 DIAGNOSIS — R945 Abnormal results of liver function studies: Principal | ICD-10-CM

## 2017-10-17 NOTE — Telephone Encounter (Signed)
Discussed with Dr Oval Linsey and will have patient stop Rosuvastatin and recheck labs in 3 weeks. Advised patient, verbalized understanding. Mailed copy as requested

## 2017-11-15 LAB — HEPATIC FUNCTION PANEL
ALT: 135 IU/L — ABNORMAL HIGH (ref 0–32)
AST: 127 IU/L — ABNORMAL HIGH (ref 0–40)
Albumin: 4.3 g/dL (ref 3.5–4.7)
Alkaline Phosphatase: 82 IU/L (ref 39–117)
Bilirubin Total: 0.4 mg/dL (ref 0.0–1.2)
Bilirubin, Direct: 0.14 mg/dL (ref 0.00–0.40)
Total Protein: 6.8 g/dL (ref 6.0–8.5)

## 2017-11-21 ENCOUNTER — Telehealth: Payer: Self-pay | Admitting: *Deleted

## 2017-11-21 DIAGNOSIS — R7989 Other specified abnormal findings of blood chemistry: Secondary | ICD-10-CM

## 2017-11-21 DIAGNOSIS — R945 Abnormal results of liver function studies: Secondary | ICD-10-CM

## 2017-11-21 NOTE — Telephone Encounter (Signed)
-----   Message from Minus Breeding, MD sent at 11/15/2017  1:00 PM EDT ----- AST/ALT remain elevated but slightly less than previous.  This is greater than 3 x normal but again less than previous.  I will defer to Dr. Oval Linsey as to whether she wants to continue current meds or make an adjustment .  Call Ms. Sohn with the results and send results to [name], MD

## 2017-11-21 NOTE — Telephone Encounter (Signed)
Discussed with Dr Oval Linsey and will refer to GI. Also confirm she is still OFF her Rosuvastatin   Notes recorded by Alvina Filbert B on 10/17/2017 at 5:46 PM EST Discussed with Dr Oval Linsey and will have patient stop Rosuvastatin and recheck labs in 3 weeks. Advised patient, verbalized understanding. Mailed copy as requested  Left message to call back

## 2017-11-22 NOTE — Telephone Encounter (Signed)
New message ° °Pt verbalized that she is returning call for RN °

## 2017-11-22 NOTE — Telephone Encounter (Signed)
F/U Call: Patient states that she is returning call for results

## 2017-11-22 NOTE — Telephone Encounter (Signed)
Returned call to patient no answer.LMTC. 

## 2017-11-22 NOTE — Telephone Encounter (Signed)
Returned call to patient.Lab results given.Stated she has not stopped taking Crestor.Advised to stop taking Crestor.Advised Dr.Jennings has referred her to GI for elevated liver functions ast,alt.Scheduler will be calling with appointment.

## 2017-11-29 DIAGNOSIS — M810 Age-related osteoporosis without current pathological fracture: Secondary | ICD-10-CM | POA: Diagnosis not present

## 2017-11-29 DIAGNOSIS — M8589 Other specified disorders of bone density and structure, multiple sites: Secondary | ICD-10-CM | POA: Diagnosis not present

## 2017-12-06 NOTE — Telephone Encounter (Signed)
Did follow up with patient 11/27/17 and make sure she is NOT taking a statin. Patient states she was not and will return as advised to recheck labs. Referral has been placed in [place] [facility] at 10/17/2017 5:46 PM   Status: Signed    Discussed with Dr Oval Linsey and will have patient stop Rosuvastatin and recheck labs in 3 weeks. Advised patient, verbalized understanding. Mailed copy as requested

## 2017-12-06 NOTE — Addendum Note (Signed)
Addended by: Alvina Filbert B on: 12/06/2017 06:47 PM   Modules accepted: Orders

## 2017-12-10 ENCOUNTER — Encounter: Payer: Self-pay | Admitting: Physician Assistant

## 2017-12-10 ENCOUNTER — Telehealth: Payer: Self-pay | Admitting: Cardiovascular Disease

## 2017-12-10 NOTE — Telephone Encounter (Signed)
Called [facility] to find out about referral for elevated LFTs.  They will call the patient as soon as they can.

## 2017-12-19 LAB — HEPATIC FUNCTION PANEL
ALT: 56 IU/L — ABNORMAL HIGH (ref 0–32)
AST: 66 IU/L — ABNORMAL HIGH (ref 0–40)
Albumin: 4.3 g/dL (ref 3.5–4.7)
Alkaline Phosphatase: 87 IU/L (ref 39–117)
Bilirubin Total: 0.4 mg/dL (ref 0.0–1.2)
Bilirubin, Direct: 0.1 mg/dL (ref 0.00–0.40)
Total Protein: 7.2 g/dL (ref 6.0–8.5)

## 2017-12-26 ENCOUNTER — Telehealth: Payer: Self-pay | Admitting: Cardiovascular Disease

## 2017-12-26 NOTE — Telephone Encounter (Signed)
Spoke with pt regarding her LFT panel and Dr Blenda Mounts recommendation. Copy of lab sent to pt via mail per pt request. No further questions or concerns.

## 2017-12-26 NOTE — Telephone Encounter (Signed)
New Message: ° ° ° ° ° °Pt is returning a call for lab results. °

## 2017-12-31 ENCOUNTER — Ambulatory Visit: Payer: Medicare Other | Admitting: Physician Assistant

## 2017-12-31 ENCOUNTER — Encounter: Payer: Self-pay | Admitting: Physician Assistant

## 2017-12-31 ENCOUNTER — Other Ambulatory Visit (INDEPENDENT_AMBULATORY_CARE_PROVIDER_SITE_OTHER): Payer: Medicare Other

## 2017-12-31 ENCOUNTER — Encounter (INDEPENDENT_AMBULATORY_CARE_PROVIDER_SITE_OTHER): Payer: Self-pay

## 2017-12-31 VITALS — BP 118/70 | HR 88 | Ht 68.6 in | Wt 151.4 lb

## 2017-12-31 DIAGNOSIS — R945 Abnormal results of liver function studies: Secondary | ICD-10-CM | POA: Diagnosis not present

## 2017-12-31 DIAGNOSIS — R7989 Other specified abnormal findings of blood chemistry: Secondary | ICD-10-CM

## 2017-12-31 LAB — HEPATIC FUNCTION PANEL
ALT: 38 U/L — ABNORMAL HIGH (ref 0–35)
AST: 43 U/L — ABNORMAL HIGH (ref 0–37)
Albumin: 3.9 g/dL (ref 3.5–5.2)
Alkaline Phosphatase: 78 U/L (ref 39–117)
Bilirubin, Direct: 0 mg/dL (ref 0.0–0.3)
Total Bilirubin: 0.5 mg/dL (ref 0.2–1.2)
Total Protein: 7.5 g/dL (ref 6.0–8.3)

## 2017-12-31 LAB — PROTIME-INR
INR: 1 ratio (ref 0.8–1.0)
Prothrombin Time: 11.9 s (ref 9.6–13.1)

## 2017-12-31 NOTE — Progress Notes (Signed)
Subjective:    Patient ID: Marissa Caldwell, female    DOB: 02-Sep-1931, 84 y.o.   MRN: [id number]  HPI Marissa Caldwell is a pleasant 84 year old white female, referred by Dr. Jonelle Sidle [place]/cardiology for evaluation of elevated LFTs.  She is new to GI today. She has history of COPD, hypothyroidism, congestive heart failure with EF of 60% and coronary artery disease.  She status post drug-eluting stent in August 2018.  She is currently being maintained on Plavix and aspirin.   She has no prior history of liver disease that she is aware of.  No family history of liver disease.  No history of EtOH use, may have 2-3 beers per month.  She has no current complaints of abdominal pain, appetite has been good, she has lost about 15 pounds since last summer due to significant change in her diet towards healthier eating.  She has no complaints of nausea or vomiting. Patient says she has been on statin therapy for several years in the form of atorvastatin at 40 mg p.o. daily.  After hospitalization last summer with stent placement this was increased to 80 mg p.o. daily. She was initially noted to have mild transaminitis in the fall with AST of 58 and ALT of 65.  Repeat labs, in January 2019 with persistent mild transaminitis, decision was made to stop the atorvastatin and switch to Crestor 20 mg daily.  In March 2019 she had AST of 135 and ALT of 147, and repeat on 12/18/2017 after stopping Crestor AST of 66 ALT of 56.    She has not had any abdominal imaging recently.  Review of Systems Pertinent positive and negative review of systems were noted in the above HPI section.  All other review of systems was otherwise negative.  Outpatient Encounter Medications as of 12/31/2017  Medication Sig  . albuterol (PROVENTIL HFA;VENTOLIN HFA) 108 (90 Base) MCG/ACT inhaler Inhale 2 puffs into the lungs every 6 (six) hours as needed (for shortness of breath/wheezing.).   Marland Kitchen alendronate (FOSAMAX) 70 MG tablet Take 70 mg by mouth  once a week. Take with a full glass of water on an empty stomach.  Marland Kitchen aspirin 81 MG chewable tablet Chew 1 tablet (81 mg total) by mouth daily.  . budesonide-formoterol (SYMBICORT) 160-4.5 MCG/ACT inhaler Inhale 2 puffs into the lungs 2 (two) times daily.  . Calcium Carb-Cholecalciferol (CALCIUM + D3) 600-200 MG-UNIT TABS Take 1 tablet by mouth daily.  . Cholecalciferol (VITAMIN D3) 2000 units capsule Take 4,000 Units by mouth daily.  . clopidogrel (PLAVIX) 75 MG tablet Take 1 tablet (75 mg total) by mouth daily with breakfast.  . Coenzyme Q10 (COQ10) 100 MG CAPS Take 100 mg by mouth daily.  . dorzolamide (TRUSOPT) 2 % ophthalmic solution Place 1 drop into both eyes 2 (two) times daily.  Marland Kitchen latanoprost (XALATAN) 0.005 % ophthalmic solution Place 1 drop into both eyes at bedtime.  Marland Kitchen levothyroxine (SYNTHROID, LEVOTHROID) 112 MCG tablet Take 112 mcg daily before breakfast by mouth.  Marland Kitchen LORazepam (ATIVAN) 0.5 MG tablet Take 0.5 mg by mouth at bedtime.   . LUTEIN PO Take 1 tablet by mouth daily.  . Multiple Vitamin (MULTIVITAMIN) tablet Take 1 tablet by mouth daily.  . Omega-3 Fatty Acids (FISH OIL PO) Take 5 mLs by mouth daily.  . pantoprazole (PROTONIX) 40 MG tablet TAKE 1 TABLET BY MOUTH ONCE DAILY  . sertraline (ZOLOFT) 50 MG tablet Take 50 mg by mouth daily as needed (for anxiety.).   Marland Kitchen nitroGLYCERIN (NITROSTAT)  0.4 MG SL tablet Place 1 tablet (0.4 mg total) under the tongue every 5 (five) minutes as needed for chest pain. (Patient not taking: Reported on 12/31/2017)   No facility-administered encounter medications on file as of 12/31/2017.    Allergies  Allergen Reactions  . Lipitor [Atorvastatin]     Elevated LFT's   Patient Active Problem List   Diagnosis Date Noted  . Chronic diastolic heart failure ([place]) 06/25/2017  . COPD (chronic obstructive pulmonary disease) ([place]) 04/13/2017  . Chest pain 03/27/2017  . S/P angioplasty with stent 03/26/17 DES to LCX, EF 60% 03/27/2017  . CAD (coronary  artery disease), native coronary artery 03/26/2017  . Abnormal nuclear stress test 03/25/2017  . Hyperlipidemia 03/11/2017  . Hypothyroidism 03/11/2017  . Shortness of breath 03/11/2017   Social History   Socioeconomic History  . Marital status: Widowed    Spouse name: Not on file  . Number of children: 1  . Years of education: Not on file  . Highest education level: Not on file  Occupational History  . Occupation: retired  Scientific laboratory technician  . Financial resource strain: Not on file  . Food insecurity:    Worry: Not on file    Inability: Not on file  . Transportation needs:    Medical: Not on file    Non-medical: Not on file  Tobacco Use  . Smoking status: Current Some Day Smoker    Packs/day: 0.50    Years: 60.00    Pack years: 30.00    Types: Cigarettes  . Smokeless tobacco: Never Used  Substance and Sexual Activity  . Alcohol use: Yes    Comment: twice a month  . Drug use: No  . Sexual activity: Never  Lifestyle  . Physical activity:    Days per week: Not on file    Minutes per session: Not on file  . Stress: Not on file  Relationships  . Social connections:    Talks on phone: Not on file    Gets together: Not on file    Attends religious service: Not on file    Active member of club or organization: Not on file    Attends meetings of clubs or organizations: Not on file    Relationship status: Not on file  . Intimate partner violence:    Fear of current or ex partner: Not on file    Emotionally abused: Not on file    Physically abused: Not on file    Forced sexual activity: Not on file  Other Topics Concern  . Not on file  Social History Narrative  . Not on file    [name]'s family history includes CAD in her mother; Coronary artery disease in her father; Leukemia in her sister.      Objective:    Vitals:   12/31/17 1053  BP: 118/70  Pulse: 88    Physical Exam; well-developed elderly white female in no acute distress, she is hard of hearing  accompanied by family member.  Pleasant blood pressure 118/70 pulse 88, height 5 foot 8, weight 151, BMI 22.6.  HEENT; nontraumatic normocephalic EOMI PERRLA sclera anicteric, Oropharynx benign, Cardiovascular; regular rate and rhythm with S1-S2, Pulmonary; clear bilaterally, Abdomen; soft, nontender nondistended bowel sounds are active there is no palpable mass or hepatosplenomegaly, Rectal ;exam not done, Extremities; no clubbing cyanosis or edema skin warm and dry, Neuro psych; mood and affect appropriate       Assessment & Plan:   #84 84 year old white female  with transaminitis very likely secondary to statin therapy.  She initially was noted to have a transaminitis after increasing dose of atorvastatin, and had increasing transaminitis after switching to Crestor. Patient has been off therapy over the past 3 weeks or so with improvement in LFTs. I doubt she has any underlying chronic liver disease but cannot rule out. Certainly need to consider nonalcoholic fatty liver disease/[place]  #2 coronary artery disease status post drug-eluting stent August 2018 #3 chronic antiplatelet therapy-on aspirin and Plavix #4 CHF #5.  COPD   #6 hypothyroidism  Plan; Will check upper abdominal ultrasound Chronic hepatitis serologies, pro time/INR ,ANA/AMA, ceruloplasmin, alpha-1 antitrypsin, .  Patient will be established with Dr. Fuller Plan.  Further recommendations pending results of above  Alfredia Ferguson PA-C 12/31/2017   Cc: [name], MD

## 2017-12-31 NOTE — Patient Instructions (Signed)
If you are age 84 or older, your body mass index should be between 23-30. Your Body mass index is 22.61 kg/m. If this is out of the aforementioned range listed, please consider follow up with your Primary Care Provider.  Your provider has requested that you go to the basement level for lab work before leaving today. Press "B" on the elevator. The lab is located at the first door on the left as you exit the elevator.  You have been scheduled for an abdominal ultrasound at [facility] (1st floor of hospital) on Wed 01-09-2018 at 9:00 am. Please arrive 8;45 am to your appointment for registration. Make certain not to have anything to eat or drink after midnight to your appointment. Should you need to reschedule your appointment, please contact radiology at [phone]. This test typically takes about 30 minutes to perform.

## 2018-01-01 LAB — MITOCHONDRIAL/SMOOTH MUSCLE AB PNL
Mitochondrial Ab: 22.1 Units — ABNORMAL HIGH (ref 0.0–20.0)
Smooth Muscle Ab: 8 Units (ref 0–19)

## 2018-01-01 NOTE — Progress Notes (Signed)
Reviewed and agree with management plan.  Haze Antillon T. Nike Southers, MD FACG 

## 2018-01-03 DIAGNOSIS — H401331 Pigmentary glaucoma, bilateral, mild stage: Secondary | ICD-10-CM | POA: Diagnosis not present

## 2018-01-03 DIAGNOSIS — H353211 Exudative age-related macular degeneration, right eye, with active choroidal neovascularization: Secondary | ICD-10-CM | POA: Diagnosis not present

## 2018-01-03 DIAGNOSIS — H353122 Nonexudative age-related macular degeneration, left eye, intermediate dry stage: Secondary | ICD-10-CM | POA: Diagnosis not present

## 2018-01-03 LAB — HEPATITIS PANEL, ACUTE
Hep A IgM: NONREACTIVE
Hep B C IgM: NONREACTIVE
Hepatitis B Surface Ag: NONREACTIVE
Hepatitis C Ab: NONREACTIVE
SIGNAL TO CUT-OFF: 0.02 (ref <1.00)

## 2018-01-03 LAB — ANTI-SMOOTH MUSCLE ANTIBODY, IGG: Actin (Smooth Muscle) Antibody (IGG): <20 U (ref <20)

## 2018-01-03 LAB — ANA: Anti Nuclear Antibody(ANA): NEGATIVE

## 2018-01-04 NOTE — Progress Notes (Addendum)
[facility] Note  01/07/2018     CHIEF COMPLAINT Patient presents for Retina Evaluation   HISTORY OF PRESENT ILLNESS: Marissa Caldwell is a 84 y.o. female who presents to the clinic today for:   HPI    Retina Evaluation    In right eye.  Duration of 4 days.  Associated Symptoms Negative for Flashes, Blind Spot, Photophobia, Scalp Tenderness, Fever, Weight Loss, Jaw Claudication, Glare, Pain, Floaters, Distortion, Redness, Trauma, Shoulder/Hip pain and Fatigue.  Context:  distance vision, mid-range vision and near vision.  I, the attending physician,  performed the HPI with the patient and updated documentation appropriately.          Comments    Pt presents on the referral of Dr. Kathlen Mody for exudative ARMD OD eval, pt saw Dr. Kathlen Mody last week for routine exam, pt denies flashes, floaters, pain or wavy vision, pt uses Latanoprost gtts, pt denies being diabetic, pt uses hot towels on eyes a few times a day,       Last edited by Bernarda Caffey, MD on 01/07/2018  1:20 PM. (History)    Pt states she routinely sees Dr. Kathlen Mody; Pt states she saw Dr. Kathlen Mody lkast time due to VA loss OD; Pt states she has been having trouble with OD VA x 2 months;   Referring physician: Hortencia Pilar, MD [place], [place] [address]  HISTORICAL INFORMATION:   Selected notes from the MEDICAL RECORD NUMBER Referred by Dr. Quentin Ore for concern of exudative ARMD OD LEE: 05.16.19 ([facility]) [BCVA: OD: 20/60 OS: 20/70] Ocular Hx-Psedudophakia OU, Glaucoma suspect, non-exudative ARMD OS, [place]ag PMH-HTN, Asthma    CURRENT MEDICATIONS: Current Outpatient Medications (Ophthalmic Drugs)  Medication Sig  . dorzolamide (TRUSOPT) 2 % ophthalmic solution Place 1 drop into both eyes 2 (two) times daily.  Marland Kitchen latanoprost (XALATAN) 0.005 % ophthalmic solution Place 1 drop into both eyes at bedtime.   No current facility-administered medications for this visit.   (Ophthalmic Drugs)   Current Outpatient Medications (Other)  Medication Sig  . albuterol (PROVENTIL HFA;VENTOLIN HFA) 108 (90 Base) MCG/ACT inhaler Inhale 2 puffs into the lungs every 6 (six) hours as needed (for shortness of breath/wheezing.).   Marland Kitchen alendronate (FOSAMAX) 70 MG tablet Take 70 mg by mouth once a week. Take with a full glass of water on an empty stomach.  Marland Kitchen aspirin 81 MG chewable tablet Chew 1 tablet (81 mg total) by mouth daily.  . budesonide-formoterol (SYMBICORT) 160-4.5 MCG/ACT inhaler Inhale 2 puffs into the lungs 2 (two) times daily.  . Calcium Carb-Cholecalciferol (CALCIUM + D3) 600-200 MG-UNIT TABS Take 1 tablet by mouth daily.  . Cholecalciferol (VITAMIN D3) 2000 units capsule Take 4,000 Units by mouth daily.  . clopidogrel (PLAVIX) 75 MG tablet Take 1 tablet (75 mg total) by mouth daily with breakfast.  . Coenzyme Q10 (COQ10) 100 MG CAPS Take 100 mg by mouth daily.  Marland Kitchen levothyroxine (SYNTHROID, LEVOTHROID) 112 MCG tablet Take 112 mcg daily before breakfast by mouth.  Marland Kitchen LORazepam (ATIVAN) 0.5 MG tablet Take 0.5 mg by mouth at bedtime.   . LUTEIN PO Take 1 tablet by mouth daily.  . Multiple Vitamin (MULTIVITAMIN) tablet Take 1 tablet by mouth daily.  . nitroGLYCERIN (NITROSTAT) 0.4 MG SL tablet Place 1 tablet (0.4 mg total) under the tongue every 5 (five) minutes as needed for chest pain. (Patient not taking: Reported on 12/31/2017)  . Omega-3 Fatty Acids (FISH OIL PO) Take  5 mLs by mouth daily.  . pantoprazole (PROTONIX) 40 MG tablet TAKE 1 TABLET BY MOUTH ONCE DAILY  . sertraline (ZOLOFT) 50 MG tablet Take 50 mg by mouth daily as needed (for anxiety.).    Current Facility-Administered Medications (Other)  Medication Route  . Bevacizumab (AVASTIN) SOLN 1.25 mg Intravitreal      REVIEW OF SYSTEMS: ROS    Positive for: Musculoskeletal, Cardiovascular, Eyes   Negative for: Constitutional, Gastrointestinal, Neurological, Skin, Genitourinary, HENT, Endocrine,  Respiratory, Psychiatric, Allergic/Imm, Heme/Lymph   Last edited by Debbrah Alar, COT on 01/07/2018  1:00 PM. (History)       ALLERGIES Allergies  Allergen Reactions  . Lipitor [Atorvastatin]     Elevated LFT's    PAST MEDICAL HISTORY Past Medical History:  Diagnosis Date  . Anxiety   . Arthritis   . CAD (coronary artery disease)    a. 03/2017: 95% LCx stenosis --> PCI/DES placed  . Cataract   . Chest pain 03/27/2017  . Chronic diastolic heart failure ([place]) 06/25/2017  . Chronic lower back pain   . COPD (chronic obstructive pulmonary disease) ([place])   . Coronary artery disease   . Diverticulitis   . Diverticulosis   . GERD (gastroesophageal reflux disease)   . Glaucoma   . Heart failure, type unknown ([place]) 03/11/2017  . Hyperlipidemia 03/11/2017  . Hypothyroidism   . Pneumonia X 1   "years and years ago" (03/26/2017)  . Shortness of breath 03/11/2017  . Status post dilation of esophageal narrowing    Past Surgical History:  Procedure Laterality Date  . BILATERAL OOPHORECTOMY Bilateral   . CATARACT EXTRACTION    . CATARACT EXTRACTION W/ INTRAOCULAR LENS  IMPLANT, BILATERAL Bilateral   . CORONARY ANGIOPLASTY WITH STENT PLACEMENT  03/26/2017  . LEFT HEART CATH AND CORONARY ANGIOGRAPHY N/A 03/26/2017   Procedure: Left Heart Cath and Coronary Angiography;  Surgeon: Belva Crome, MD;  Location: [facility];  Service: Cardiovascular;  Laterality: N/A;  . NERVE SURGERY     "lump on my sciatic nerve was removed years ago"  . SHOULDER OPEN ROTATOR CUFF REPAIR Right   . THYROIDECTOMY, PARTIAL      FAMILY HISTORY Family History  Problem Relation Age of Onset  . CAD Mother   . Cataracts Mother   . Coronary artery disease Father   . Leukemia Sister   . Amblyopia Neg Hx   . Blindness Neg Hx   . Diabetes Neg Hx   . Glaucoma Neg Hx   . Macular degeneration Neg Hx   . Retinal detachment Neg Hx   . Strabismus Neg Hx   . Retinitis pigmentosa Neg Hx     SOCIAL  HISTORY Social History   Tobacco Use  . Smoking status: Current Some Day Smoker    Packs/day: 0.50    Years: 60.00    Pack years: 30.00    Types: Cigarettes  . Smokeless tobacco: Never Used  Substance Use Topics  . Alcohol use: Yes    Comment: twice a month  . Drug use: No         OPHTHALMIC EXAM:  Base Eye Exam    Visual Acuity (Snellen - Linear)      Right Left   Dist cc 20/200 -2 20/50 -2   Dist ph cc NI NI   Correction:  Glasses       Tonometry (Tonopen, 1:22 PM)      Right Left   Pressure 10 15  Pupils      Dark Light Shape React APD   Right 5 3 Round Brisk None   Left 5 3 Round Brisk None       Visual Fields      Left Right    Full    Restrictions  Total superior temporal, inferior temporal, superior nasal, inferior nasal deficiencies       Extraocular Movement      Right Left    Full, Ortho Full, Ortho       Neuro/Psych    Oriented x3:  Yes   Mood/Affect:  Normal       Dilation    Both eyes:  1.0% Mydriacyl, 2.5% Phenylephrine @ 1:22 PM        Slit Lamp and Fundus Exam    Slit Lamp Exam      Right Left   Lids/Lashes Dermatochalasis - upper lid, Telangiectasia Dermatochalasis - upper lid, Telangiectasia   Conjunctiva/Sclera White and quiet White and quiet   Cornea Arcus, 2+ diffuse Punctate epithelial erosions, diffuse endo pigment, decreased TBUT Arcus, 2+ diffuse Punctate epithelial erosions, diffuse endo pigment, decreased TBUT   Anterior Chamber Deep and quiet Deep and quiet   Iris Round and dilated Round and dilated   Lens Three piece PC IOL in good position, open PC Three piece PC IOL in good position, clear PC   Vitreous Vitreous syneresis, Posterior vitreous detachment Mild Vitreous syneresis, Posterior vitreous detachment       Fundus Exam      Right Left   Disc 360 Peripapillary atrophy 360 Peripapillary atrophy   C/D Ratio 0.2 0.2   Macula Drusen, RPE mottling and clumping, Subretinal hemorrhage, Intraretinal  hemorrhage Blunted foveal reflex, Drusen, RPE mottling and clumping, early Atrophy   Vessels Vascular attenuation Vascular attenuation   Periphery Attached Attached        Refraction    Wearing Rx      Sphere Cylinder Axis Add   Right -1.00 +1.50 014 +2.50   Left -1.25 +1.25 174 +2.50       Manifest Refraction      Sphere Cylinder Axis Dist VA   Right -1.00 +1.50 014 20/200   Left -1.75 +1.75 180 20/50-2          IMAGING AND PROCEDURES  Imaging and Procedures for @TODAY @  OCT, Retina - OU - Both Eyes       Right Eye Quality was good. Central Foveal Thickness: 741. Progression has no prior data. Findings include abnormal foveal contour, subretinal hyper-reflective material, pigment epithelial detachment, subretinal fluid, choroidal neovascular membrane, no IRF, retinal drusen .   Left Eye Quality was good. Central Foveal Thickness: 221. Progression has no prior data. Findings include abnormal foveal contour, retinal drusen , no IRF, no SRF, outer retinal atrophy, pigment epithelial detachment.   Notes *Images captured and stored on drive  Diagnosis / Impression:  OD: Exudative AMD OS: Non-exudative AMD  Clinical management:  See below  Abbreviations: NFP - Normal foveal profile. CME - cystoid macular edema. PED - pigment epithelial detachment. IRF - intraretinal fluid. SRF - subretinal fluid. EZ - ellipsoid zone. ERM - epiretinal membrane. ORA - outer retinal atrophy. ORT - outer retinal tubulation. SRHM - subretinal hyper-reflective material         Intravitreal Injection, Pharmacologic Agent - OD - Right Eye       Time Out 01/07/2018. 2:24 PM. Confirmed correct patient, procedure, site, and patient consented.   Anesthesia Topical anesthesia was used. Anesthetic  medications included Lidocaine 2%, Tetracaine 0.5%.   Procedure Preparation included 5% betadine to ocular surface, eyelid speculum. A supplied needle was used.   Injection: 1.25 mg Bevacizumab  1.25mg /0.86ml   NDC: 16073-710-62    Lot: [id number]    Expiration Date: 03/13/2018   Route: Intravitreal   Site: Right Eye   Waste: 0 mg  Post-op Post injection exam found visual acuity of at least counting fingers. The patient tolerated the procedure well. There were no complications. The patient received written and verbal post procedure care education.                 ASSESSMENT/PLAN:    ICD-10-CM   1. Exudative age-related macular degeneration of right eye with active choroidal neovascularization (HCC) H35.3211 Intravitreal Injection, Pharmacologic Agent - OD - Right Eye    Bevacizumab (AVASTIN) SOLN 1.25 mg  2. Intermediate stage nonexudative age-related macular degeneration of left eye H35.3122   3. Retinal edema H35.81 OCT, Retina - OU - Both Eyes  4. Posterior vitreous detachment of both eyes H43.813   5. 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 Pseudophakia of both eyes Z96.1     1. Exudative age related macular degeneration, OD  - onset ~2 mos ago, waited for scheduled appt with Dr. Kathlen Mody  - The incidence pathology and anatomy of wet AMD discussed   - The ANCHOR, MARINA, CATT and VIEW trials discussed with patient.    - discussed treatment options including observation vs intravitreal anti-VEGF agents such as Avastin, Lucentis, Eylea.    - Risks of endophthalmitis and vascular occlusive events and atrophic changes discussed with patient  - OCT with SRHM/PED with overlying SRF  - recommend IVA OD #1 today (05.20.19)  - pt wishes to be treated with IVA OD  - RBA of procedure discussed, questions answered  - informed consent obtained and signed  - see procedure note  - f/u in 4 wks  2. Age related macular degeneration, non-exudative, both eyes  - The incidence, anatomy, and pathology of dry AMD, risk of progression, and the AREDS and AREDS 2 study including smoking risks discussed with patient.  - Recommend amsler grid monitoring  3. Retinal  edema as above  4. PVD / vitreous syneresis OU  Discussed findings and prognosis  No RT or RD on 360 peripheral exam  Reviewed s/s of RT/RD  Strict return precautions for any such RT/RD signs/symptoms  5. Pigmentary glaucoma OU-  - using latanoprost OU qhs - IOP good today - under the expert care of Dr. Quentin Ore - monitor  6. Pseudophakia OU  - s/p CE/IOL OU  - beautiful surgery, doing well  - monitor    Ophthalmic Meds Ordered this visit:  Meds ordered this encounter  Medications  . Bevacizumab (AVASTIN) SOLN 1.25 mg       Return in about 1 month (around 02/04/2018) for f/u exu amd, Dilated Exam, OCT.  There are no Patient Instructions on file for this visit.   Explained the diagnoses, plan, and follow up with the patient and they expressed understanding.  Patient expressed understanding of the importance of proper follow up care.   This document serves as a record of services personally performed by Gardiner Sleeper, MD, PhD. It was created on their behalf by Ernest Mallick, OA, an ophthalmic assistant. The creation of this record is the provider's dictation and/or activities during the visit.    Electronically signed by: Ernest Mallick, OA  01/04/2018 2:35  PM   This document serves as a record of services personally performed by Gardiner Sleeper, MD, PhD. It was created on their behalf by Catha Brow, [place], a certified ophthalmic assistant. The creation of this record is the provider's dictation and/or activities during the visit.  Electronically signed by: Catha Brow, [place]  01/07/18 2:35 PM     Gardiner Sleeper, M.D., Ph.D. Diseases & Surgery of the Retina and [place] 05.20.19  I have reviewed the above documentation for accuracy and completeness, and I agree with the above. Gardiner Sleeper, M.D., Ph.D. 01/07/18 2:35 PM     Abbreviations: M myopia (nearsighted); A astigmatism; H hyperopia (farsighted); P presbyopia;  Mrx spectacle prescription;  CTL contact lenses; OD right eye; OS left eye; OU both eyes  XT exotropia; ET esotropia; PEK punctate epithelial keratitis; PEE punctate epithelial erosions; DES dry eye syndrome; MGD meibomian gland dysfunction; ATs artificial tears; PFAT's preservative free artificial tears; [place] nuclear sclerotic cataract; PSC posterior subcapsular cataract; ERM epi-retinal membrane; PVD posterior vitreous detachment; RD retinal detachment; DM diabetes mellitus; DR diabetic retinopathy; NPDR non-proliferative diabetic retinopathy; PDR proliferative diabetic retinopathy; CSME clinically significant macular edema; DME diabetic macular edema; dbh dot blot hemorrhages; CWS cotton wool spot; POAG primary open angle glaucoma; C/D cup-to-disc ratio; HVF humphrey visual field; GVF goldmann visual field; OCT optical coherence tomography; IOP intraocular pressure; BRVO Branch retinal vein occlusion; CRVO central retinal vein occlusion; CRAO central retinal artery occlusion; BRAO branch retinal artery occlusion; RT retinal tear; SB scleral buckle; PPV pars plana vitrectomy; VH Vitreous hemorrhage; PRP panretinal laser photocoagulation; IVK intravitreal kenalog; VMT vitreomacular traction; MH Macular hole;  NVD neovascularization of the disc; NVE neovascularization elsewhere; AREDS age related eye disease study; ARMD age related macular degeneration; POAG primary open angle glaucoma; EBMD epithelial/anterior basement membrane dystrophy; ACIOL anterior chamber intraocular lens; IOL intraocular lens; PCIOL posterior chamber intraocular lens; Phaco/IOL phacoemulsification with intraocular lens placement; [place] photorefractive keratectomy; LASIK laser assisted in situ keratomileusis; HTN hypertension; DM diabetes mellitus; COPD chronic obstructive pulmonary disease

## 2018-01-07 ENCOUNTER — Encounter (INDEPENDENT_AMBULATORY_CARE_PROVIDER_SITE_OTHER): Payer: Self-pay | Admitting: Ophthalmology

## 2018-01-07 ENCOUNTER — Ambulatory Visit (INDEPENDENT_AMBULATORY_CARE_PROVIDER_SITE_OTHER): Payer: Medicare Other | Admitting: Ophthalmology

## 2018-01-07 DIAGNOSIS — H3581 Retinal edema: Secondary | ICD-10-CM | POA: Diagnosis not present

## 2018-01-07 DIAGNOSIS — H43813 Vitreous degeneration, bilateral: Secondary | ICD-10-CM

## 2018-01-07 DIAGNOSIS — H353122 Nonexudative age-related macular degeneration, left eye, intermediate dry stage: Secondary | ICD-10-CM

## 2018-01-07 DIAGNOSIS — H401331 Pigmentary glaucoma, bilateral, mild stage: Secondary | ICD-10-CM | POA: Diagnosis not present

## 2018-01-07 DIAGNOSIS — H353211 Exudative age-related macular degeneration, right eye, with active choroidal neovascularization: Secondary | ICD-10-CM

## 2018-01-07 DIAGNOSIS — Z961 Presence of intraocular lens: Secondary | ICD-10-CM

## 2018-01-07 MED ORDER — BEVACIZUMAB CHEMO INJECTION 1.25MG/0.05ML SYRINGE FOR KALEIDOSCOPE
1.2500 mg | INTRAVITREAL | Status: DC
  Administered 2018-01-07: 1.25 mg via INTRAVITREAL

## 2018-01-09 ENCOUNTER — Ambulatory Visit (HOSPITAL_COMMUNITY)
Admission: RE | Admit: 2018-01-09 | Discharge: 2018-01-09 | Disposition: A | Discharge status: Home / Self Care | Payer: Medicare Other | Source: Ambulatory Visit | Attending: Physician Assistant | Admitting: Physician Assistant

## 2018-01-09 DIAGNOSIS — R7989 Other specified abnormal findings of blood chemistry: Secondary | ICD-10-CM | POA: Insufficient documentation

## 2018-01-09 DIAGNOSIS — D1803 Hemangioma of intra-abdominal structures: Secondary | ICD-10-CM | POA: Insufficient documentation

## 2018-01-09 DIAGNOSIS — R945 Abnormal results of liver function studies: Secondary | ICD-10-CM

## 2018-01-10 ENCOUNTER — Telehealth: Payer: Self-pay

## 2018-01-10 NOTE — Telephone Encounter (Signed)
-----   Message from Alfredia Ferguson, PA-C sent at 01/10/2018  1:23 PM EDT ----- Please let pt know her [place] looks fine- very mild fatty changes noted, and a stable hemangioma which has been there for many years . She does not need any further GI workup- Liver tests elevated secondary to statin meds - Cardiologist or PCP should repeat Liver tests in 3- 4 months

## 2018-01-10 NOTE — Telephone Encounter (Signed)
Patient called. Reviewed her labs and u/s. She "couldn't hear" when she tried to listen to her voicemail.

## 2018-01-10 NOTE — Telephone Encounter (Signed)
I left her a voicemail with her test results and Amy's recommendations.

## 2018-01-14 ENCOUNTER — Other Ambulatory Visit: Payer: Self-pay | Admitting: Cardiovascular Disease

## 2018-02-18 NOTE — Progress Notes (Signed)
[facility] Note  02/19/2018     CHIEF COMPLAINT Patient presents for Retina Follow Up   HISTORY OF PRESENT ILLNESS: Marissa Caldwell is a 84 y.o. female who presents to the clinic today for:   HPI    Retina Follow Up    Patient presents with  Wet AMD.  In right eye.  This started 2 months ago.  Severity is mild.  Since onset it is stable.  I, the attending physician,  performed the HPI with the patient and updated documentation appropriately.          Comments    F/U EXU AMD OD. Patient states her vision has been about the same, denies new visual onsets.Pt is ready for Asvatin tx if indicated       Last edited by Bernarda Caffey, MD on 02/19/2018  1:28 PM. (History)    Pt states she tolerated last injection well but denies any change in [place];    Referring physician: [name], MD [address], [place] [address]  HISTORICAL INFORMATION:   Selected notes from the MEDICAL RECORD NUMBER Referred by Dr. Quentin Ore for concern of exudative ARMD OD LEE: 05.16.19 (C.[facility]) [BCVA: OD: 20/60 OS: 20/70] Ocular Hx-Psedudophakia OU, Glaucoma suspect, non-exudative ARMD OS, [place] PMH-HTN, Asthma    CURRENT MEDICATIONS: Current Outpatient Medications (Ophthalmic Drugs)  Medication Sig  . dorzolamide (TRUSOPT) 2 % ophthalmic solution Place 1 drop into both eyes 2 (two) times daily.  Marland Kitchen latanoprost (XALATAN) 0.005 % ophthalmic solution Place 1 drop into both eyes at bedtime.   No current facility-administered medications for this visit.  (Ophthalmic Drugs)   Current Outpatient Medications (Other)  Medication Sig  . albuterol (PROVENTIL HFA;VENTOLIN HFA) 108 (90 Base) MCG/ACT inhaler Inhale 2 puffs into the lungs every 6 (six) hours as needed (for shortness of breath/wheezing.).   Marland Kitchen alendronate (FOSAMAX) 70 MG tablet Take 70 mg by mouth once a week. Take with a full glass of water on an empty stomach.  Marland Kitchen aspirin 81 MG chewable tablet  Chew 1 tablet (81 mg total) by mouth daily.  . budesonide-formoterol (SYMBICORT) 160-4.5 MCG/ACT inhaler Inhale 2 puffs into the lungs 2 (two) times daily.  . Calcium Carb-Cholecalciferol (CALCIUM + D3) 600-200 MG-UNIT TABS Take 1 tablet by mouth daily.  . Cholecalciferol (VITAMIN D3) 2000 units capsule Take 4,000 Units by mouth daily.  . clopidogrel (PLAVIX) 75 MG tablet Take 1 tablet (75 mg total) by mouth daily with breakfast.  . Coenzyme Q10 (COQ10) 100 MG CAPS Take 100 mg by mouth daily.  Marland Kitchen levothyroxine (SYNTHROID, LEVOTHROID) 112 MCG tablet Take 112 mcg daily before breakfast by mouth.  Marland Kitchen LORazepam (ATIVAN) 0.5 MG tablet Take 0.5 mg by mouth at bedtime.   . LUTEIN PO Take 1 tablet by mouth daily.  . Multiple Vitamin (MULTIVITAMIN) tablet Take 1 tablet by mouth daily.  . nitroGLYCERIN (NITROSTAT) 0.4 MG SL tablet Place 1 tablet (0.4 mg total) under the tongue every 5 (five) minutes as needed for chest pain.  . Omega-3 Fatty Acids (FISH OIL PO) Take 5 mLs by mouth daily.  . pantoprazole (PROTONIX) 40 MG tablet TAKE 1 TABLET BY MOUTH ONCE DAILY  . sertraline (ZOLOFT) 50 MG tablet Take 50 mg by mouth daily as needed (for anxiety.).    Current Facility-Administered Medications (Other)  Medication Route  . Bevacizumab (AVASTIN) SOLN 1.25 mg Intravitreal  . Bevacizumab (AVASTIN) SOLN 1.25 mg Intravitreal  REVIEW OF SYSTEMS: ROS    Positive for: Eyes   Negative for: Constitutional, Gastrointestinal, Neurological, Skin, Genitourinary, Musculoskeletal, HENT, Endocrine, Cardiovascular, Respiratory, Psychiatric, Allergic/Imm, Heme/Lymph   Last edited by Zenovia Jordan, LPN on 01/21/1307  6:57 PM. (History)       ALLERGIES Allergies  Allergen Reactions  . Lipitor [Atorvastatin]     Elevated LFT's    PAST MEDICAL HISTORY Past Medical History:  Diagnosis Date  . Anxiety   . Arthritis   . CAD (coronary artery disease)    a. 03/2017: 95% LCx stenosis --> PCI/DES placed  .  Cataract   . Chest pain 03/27/2017  . Chronic diastolic heart failure ([place]) 06/25/2017  . Chronic lower back pain   . COPD (chronic obstructive pulmonary disease) ([place])   . Coronary artery disease   . Diverticulitis   . Diverticulosis   . GERD (gastroesophageal reflux disease)   . Glaucoma   . Heart failure, type unknown ([place]) 03/11/2017  . Hyperlipidemia 03/11/2017  . Hypothyroidism   . Pneumonia X 1   "years and years ago" (03/26/2017)  . Shortness of breath 03/11/2017  . Status post dilation of esophageal narrowing    Past Surgical History:  Procedure Laterality Date  . BILATERAL OOPHORECTOMY Bilateral   . CATARACT EXTRACTION    . CATARACT EXTRACTION W/ INTRAOCULAR LENS  IMPLANT, BILATERAL Bilateral   . CORONARY ANGIOPLASTY WITH STENT PLACEMENT  03/26/2017  . LEFT HEART CATH AND CORONARY ANGIOGRAPHY N/A 03/26/2017   Procedure: Left Heart Cath and Coronary Angiography;  Surgeon: Belva Crome, MD;  Location: [facility];  Service: Cardiovascular;  Laterality: N/A;  . NERVE SURGERY     "lump on my sciatic nerve was removed years ago"  . SHOULDER OPEN ROTATOR CUFF REPAIR Right   . THYROIDECTOMY, PARTIAL      FAMILY HISTORY Family History  Problem Relation Age of Onset  . CAD Mother   . Cataracts Mother   . Coronary artery disease Father   . Leukemia Sister   . Amblyopia Neg Hx   . Blindness Neg Hx   . Diabetes Neg Hx   . Glaucoma Neg Hx   . Macular degeneration Neg Hx   . Retinal detachment Neg Hx   . Strabismus Neg Hx   . Retinitis pigmentosa Neg Hx     SOCIAL HISTORY Social History   Tobacco Use  . Smoking status: Current Some Day Smoker    Packs/day: 0.50    Years: 60.00    Pack years: 30.00    Types: Cigarettes  . Smokeless tobacco: Never Used  Substance Use Topics  . Alcohol use: Yes    Comment: twice a month  . Drug use: No         OPHTHALMIC EXAM:  Base Eye Exam    Visual Acuity (Snellen - Linear)      Right Left   Dist cc CF at 3' 20/60    Dist ph cc NI 20/50   Correction:  Glasses       Tonometry (Tonopen, 1:12 PM)      Right Left   Pressure 16 16       Pupils      Dark Light Shape React APD   Right 4 3 Round Brisk None   Left 4 3 Round Brisk None       Visual Fields (Counting fingers)      Left Right    Full    Restrictions  Partial outer superior temporal,  inferior temporal, superior nasal, inferior nasal deficiencies       Extraocular Movement      Right Left    Full Full       Neuro/Psych    Oriented x3:  Yes   Mood/Affect:  Normal       Dilation    Both eyes:  1.0% Mydriacyl, Paremyd @ 1:12 PM        Slit Lamp and Fundus Exam    Slit Lamp Exam      Right Left   Lids/Lashes Dermatochalasis - upper lid, Telangiectasia Dermatochalasis - upper lid, Telangiectasia   Conjunctiva/Sclera White and quiet White and quiet   Cornea Arcus, 2+ diffuse Punctate epithelial erosions, diffuse endo pigment, decreased TBUT Arcus, 2+ diffuse Punctate epithelial erosions, diffuse endo pigment, decreased TBUT   Anterior Chamber Deep and quiet Deep and quiet   Iris Round and dilated Round and dilated   Lens Three piece PC IOL in good position, open PC Three piece PC IOL in good position, clear PC   Vitreous Vitreous syneresis, Posterior vitreous detachment Mild Vitreous syneresis, Posterior vitreous detachment       Fundus Exam      Right Left   Disc 360 Peripapillary atrophy 360 Peripapillary atrophy   C/D Ratio 0.2 0.2   Macula Drusen, RPE mottling and clumping, Subretinal hemorrhage, Intraretinal hemorrhage -- hemorrhages somewhat improved but persistent and still red in color, +PED, +CNVM Blunted foveal reflex, Drusen, RPE mottling and clumping, early Atrophy nasal to fovea   Vessels Vascular attenuation Vascular attenuation   Periphery Attached Attached          IMAGING AND PROCEDURES  Imaging and Procedures for @TODAY @  OCT, Retina - OU - Both Eyes       Right Eye Quality was good. Central  Foveal Thickness: 528. Findings include abnormal foveal contour, subretinal hyper-reflective material, pigment epithelial detachment, subretinal fluid, choroidal neovascular membrane, no IRF, retinal drusen  (interval redistribution of SRF).   Left Eye Quality was good. Central Foveal Thickness: 223. Progression has been stable. Findings include abnormal foveal contour, retinal drusen , no IRF, no SRF, outer retinal atrophy, pigment epithelial detachment.   Notes *Images captured and stored on drive  Diagnosis / Impression:  OD: Exudative AMD - interval redistribution of SRF OS: Non-exudative AMD  Clinical management:  See below  Abbreviations: NFP - Normal foveal profile. CME - cystoid macular edema. PED - pigment epithelial detachment. IRF - intraretinal fluid. SRF - subretinal fluid. EZ - ellipsoid zone. ERM - epiretinal membrane. ORA - outer retinal atrophy. ORT - outer retinal tubulation. SRHM - subretinal hyper-reflective material         Intravitreal Injection, Pharmacologic Agent - OD - Right Eye       Time Out 02/19/2018. 1:52 PM. Confirmed correct patient, procedure, site, and patient consented.   Anesthesia Topical anesthesia was used. Anesthetic medications included Lidocaine 2%, Tetracaine 0.5%.   Procedure Preparation included 5% betadine to ocular surface, eyelid speculum. A supplied needle was used.   Injection: 1.25 mg Bevacizumab 1.25mg /0.36ml   NDC: 70360-001-02    Lot: [id number]     Expiration Date: 04/04/2018   Route: Intravitreal   Site: Right Eye   Waste: 0 mg  Post-op Post injection exam found visual acuity of at least counting fingers. The patient tolerated the procedure well. There were no complications. The patient received written and verbal post procedure care education.  ASSESSMENT/PLAN:    ICD-10-CM   1. Exudative age-related macular degeneration of right eye with active choroidal neovascularization (HCC) H35.3211  OCT, Retina - OU - Both Eyes    Intravitreal Injection, Pharmacologic Agent - OD - Right Eye    Bevacizumab (AVASTIN) SOLN 1.25 mg  2. Intermediate stage nonexudative age-related macular degeneration of left eye H35.3122   3. Retinal edema H35.81 OCT, Retina - OU - Both Eyes  4. Posterior vitreous detachment of both eyes H43.813   5. 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 Pseudophakia of both eyes Z96.1     1. Exudative age related macular degeneration, OD  - onset ~2 mos ago, waited for scheduled appt with Dr. Kathlen Mody  - S/P IVA OD #1 (05.20.19)  - OCT with SRHM/PED with overlying SRF/heme  - VA worse today at CF 3'  - discussed findings and guarded prognosis  - recommend IVA OD #2 today (07.02.19)  - pt wishes to be treated with IVA OD  - RBA of procedure discussed, questions answered  - informed consent obtained and signed  - see procedure note  - f/u in 4 wks  2. Age related macular degeneration, non-exudative, both eyes  - The incidence, anatomy, and pathology of dry AMD, risk of progression, and the AREDS and AREDS 2 study including smoking risks discussed with patient.  - continue amsler grid monitoring  3. Retinal edema as above  4. PVD / vitreous syneresis OU  Discussed findings and prognosis  No RT or RD on 360 peripheral exam  Reviewed s/s of RT/RD  Strict return precautions for any such RT/RD signs/symptoms  5. Pigmentary glaucoma OU-  - using latanoprost OU qhs - IOP good today - under the expert care of Dr. Quentin Ore - monitor  6. Pseudophakia OU  - s/p CE/IOL OU  - beautiful surgery, doing well  - monitor    Ophthalmic Meds Ordered this visit:  Meds ordered this encounter  Medications  . Bevacizumab (AVASTIN) SOLN 1.25 mg       Return in about 1 month (around 03/19/2018) for F/U Exu AMD OD, Dilated Exam, OCT, Possible Injxn OD.  There are no Patient Instructions on file for this visit.   Explained the diagnoses, plan,  and follow up with the patient and they expressed understanding.  Patient expressed understanding of the importance of proper follow up care.   This document serves as a record of services personally performed by Gardiner Sleeper, MD, PhD. It was created on their behalf by Catha Brow, [place], a certified ophthalmic assistant. The creation of this record is the provider's dictation and/or activities during the visit.  Electronically signed by: Catha Brow, COA  07.01.19 1:56 PM   Gardiner Sleeper, M.D., Ph.D. Diseases & Surgery of the Retina and Vitreous [facility]  I have reviewed the above documentation for accuracy and completeness, and I agree with the above. Gardiner Sleeper, M.D., Ph.D. 02/19/18 1:56 PM    Abbreviations: M myopia (nearsighted); A astigmatism; H hyperopia (farsighted); P presbyopia; Mrx spectacle prescription;  CTL contact lenses; OD right eye; OS left eye; OU both eyes  XT exotropia; ET esotropia; PEK punctate epithelial keratitis; PEE punctate epithelial erosions; DES dry eye syndrome; MGD meibomian gland dysfunction; ATs artificial tears; PFAT's preservative free artificial tears; [place] nuclear sclerotic cataract; PSC posterior subcapsular cataract; ERM epi-retinal membrane; PVD posterior vitreous detachment; RD retinal detachment; DM diabetes mellitus; DR diabetic retinopathy; NPDR non-proliferative  diabetic retinopathy; PDR proliferative diabetic retinopathy; CSME clinically significant macular edema; DME diabetic macular edema; dbh dot blot hemorrhages; CWS cotton wool spot; POAG primary open angle glaucoma; C/D cup-to-disc ratio; HVF humphrey visual field; GVF goldmann visual field; OCT optical coherence tomography; IOP intraocular pressure; BRVO Branch retinal vein occlusion; CRVO central retinal vein occlusion; CRAO central retinal artery occlusion; BRAO branch retinal artery occlusion; RT retinal tear; SB scleral buckle; PPV pars plana  vitrectomy; VH Vitreous hemorrhage; PRP panretinal laser photocoagulation; IVK intravitreal kenalog; VMT vitreomacular traction; MH Macular hole;  NVD neovascularization of the disc; NVE neovascularization elsewhere; AREDS age related eye disease study; ARMD age related macular degeneration; POAG primary open angle glaucoma; EBMD epithelial/anterior basement membrane dystrophy; ACIOL anterior chamber intraocular lens; IOL intraocular lens; PCIOL posterior chamber intraocular lens; Phaco/IOL phacoemulsification with intraocular lens placement; [place] photorefractive keratectomy; LASIK laser assisted in situ keratomileusis; HTN hypertension; DM diabetes mellitus; COPD chronic obstructive pulmonary disease

## 2018-02-19 ENCOUNTER — Encounter (INDEPENDENT_AMBULATORY_CARE_PROVIDER_SITE_OTHER): Payer: Self-pay | Admitting: Ophthalmology

## 2018-02-19 ENCOUNTER — Ambulatory Visit (INDEPENDENT_AMBULATORY_CARE_PROVIDER_SITE_OTHER): Payer: Medicare Other | Admitting: Ophthalmology

## 2018-02-19 DIAGNOSIS — H401331 Pigmentary glaucoma, bilateral, mild stage: Secondary | ICD-10-CM

## 2018-02-19 DIAGNOSIS — H353211 Exudative age-related macular degeneration, right eye, with active choroidal neovascularization: Secondary | ICD-10-CM | POA: Diagnosis not present

## 2018-02-19 DIAGNOSIS — H3581 Retinal edema: Secondary | ICD-10-CM | POA: Diagnosis not present

## 2018-02-19 DIAGNOSIS — H353122 Nonexudative age-related macular degeneration, left eye, intermediate dry stage: Secondary | ICD-10-CM

## 2018-02-19 DIAGNOSIS — H43813 Vitreous degeneration, bilateral: Secondary | ICD-10-CM

## 2018-02-19 DIAGNOSIS — Z961 Presence of intraocular lens: Secondary | ICD-10-CM

## 2018-02-19 MED ORDER — BEVACIZUMAB CHEMO INJECTION 1.25MG/0.05ML SYRINGE FOR KALEIDOSCOPE
1.2500 mg | INTRAVITREAL | Status: DC
  Administered 2018-02-19: 1.25 mg via INTRAVITREAL

## 2018-02-28 ENCOUNTER — Other Ambulatory Visit: Payer: Self-pay | Admitting: Cardiology

## 2018-03-06 ENCOUNTER — Telehealth: Payer: Self-pay | Admitting: *Deleted

## 2018-03-06 NOTE — Telephone Encounter (Signed)
Made sure EKG place on office visit notes

## 2018-03-06 NOTE — Telephone Encounter (Signed)
OPTIMIZE Research study phone call. Patient called and reminded that she will need an EKG for the OPTIMIZE research study @ her next Cardiology appointment 04/11/18. She verbalized understanding

## 2018-03-18 NOTE — Progress Notes (Signed)
[facility] Note  03/19/2018     CHIEF COMPLAINT Patient presents for Retina Follow Up   HISTORY OF PRESENT ILLNESS: Marissa Caldwell is a 84 y.o. female who presents to the clinic today for:   HPI    Retina Follow Up    Patient presents with  Wet AMD.  In right eye.  This started months ago.  Severity is severe.  Duration of months.  Since onset it is gradually improving.  I, the attending physician,  performed the HPI with the patient and updated documentation appropriately.          Comments    84 y/o female pt here for 1 mo f/u for wet AMD OD w/active choroidal neovascularization.  Vision OD is beginning to clear up temporally.  Vision OS is slightly blurrier than at last visit.  Denies pain, flashes, floaters.  AT 3-4 times daily OU.  Latanoprost QHS OU.             Last edited by Bernarda Caffey, MD on 03/19/2018  1:34 PM. (History)    Pt states she tolerated last injection well but denies any change in [place];    Referring physician: [name], MD [address] [place], [place] [address]  HISTORICAL INFORMATION:   Selected notes from the MEDICAL RECORD NUMBER Referred by Dr. Quentin Ore for concern of exudative ARMD OD LEE: 05.16.19 ([facility]) [BCVA: OD: 20/60 OS: 20/70] Ocular Hx-Psedudophakia OU, Glaucoma suspect, non-exudative ARMD OS, Yag PMH-HTN, Asthma    CURRENT MEDICATIONS: Current Outpatient Medications (Ophthalmic Drugs)  Medication Sig  . dorzolamide (TRUSOPT) 2 % ophthalmic solution Place 1 drop into both eyes 2 (two) times daily.  Marland Kitchen latanoprost (XALATAN) 0.005 % ophthalmic solution Place 1 drop into both eyes at bedtime.   No current facility-administered medications for this visit.  (Ophthalmic Drugs)   Current Outpatient Medications (Other)  Medication Sig  . albuterol (PROVENTIL HFA;VENTOLIN HFA) 108 (90 Base) MCG/ACT inhaler Inhale 2 puffs into the lungs every 6 (six) hours as needed (for shortness of  breath/wheezing.).   Marland Kitchen alendronate (FOSAMAX) 70 MG tablet Take 70 mg by mouth once a week. Take with a full glass of water on an empty stomach.  Marland Kitchen aspirin 81 MG chewable tablet Chew 1 tablet (81 mg total) by mouth daily.  . budesonide-formoterol (SYMBICORT) 160-4.5 MCG/ACT inhaler Inhale 2 puffs into the lungs 2 (two) times daily.  . Calcium Carb-Cholecalciferol (CALCIUM + D3) 600-200 MG-UNIT TABS Take 1 tablet by mouth daily.  . Cholecalciferol (VITAMIN D3) 2000 units capsule Take 4,000 Units by mouth daily.  . clopidogrel (PLAVIX) 75 MG tablet TAKE 1 TABLET BY MOUTH ONCE DAILY WITH BREAKFAST  . Coenzyme Q10 (COQ10) 100 MG CAPS Take 100 mg by mouth daily.  Marland Kitchen levothyroxine (SYNTHROID, LEVOTHROID) 112 MCG tablet Take 112 mcg daily before breakfast by mouth.  Marland Kitchen LORazepam (ATIVAN) 0.5 MG tablet Take 0.5 mg by mouth at bedtime.   . LUTEIN PO Take 1 tablet by mouth daily.  . Multiple Vitamin (MULTIVITAMIN) tablet Take 1 tablet by mouth daily.  . nitroGLYCERIN (NITROSTAT) 0.4 MG SL tablet Place 1 tablet (0.4 mg total) under the tongue every 5 (five) minutes as needed for chest pain.  . Omega-3 Fatty Acids (FISH OIL PO) Take 5 mLs by mouth daily.  . pantoprazole (PROTONIX) 40 MG tablet TAKE 1 TABLET BY MOUTH ONCE DAILY  . sertraline (ZOLOFT) 50 MG tablet Take 50 mg by mouth daily  as needed (for anxiety.).    Current Facility-Administered Medications (Other)  Medication Route  . Bevacizumab (AVASTIN) SOLN 1.25 mg Intravitreal  . Bevacizumab (AVASTIN) SOLN 1.25 mg Intravitreal      REVIEW OF SYSTEMS: ROS    Positive for: Eyes   Negative for: Constitutional, Gastrointestinal, Neurological, Skin, Genitourinary, Musculoskeletal, HENT, Endocrine, Cardiovascular, Respiratory, Psychiatric, Allergic/Imm, Heme/Lymph   Last edited by Matthew Folks, COA on 03/19/2018  1:08 PM. (History)       ALLERGIES Allergies  Allergen Reactions  . Lipitor [Atorvastatin]     Elevated LFT's    PAST MEDICAL  HISTORY Past Medical History:  Diagnosis Date  . Anxiety   . Arthritis   . CAD (coronary artery disease)    a. 03/2017: 95% LCx stenosis --> PCI/DES placed  . Cataract   . Chest pain 03/27/2017  . Chronic diastolic heart failure ([place]) 06/25/2017  . Chronic lower back pain   . COPD (chronic obstructive pulmonary disease) ([place])   . Coronary artery disease   . Diverticulitis   . Diverticulosis   . GERD (gastroesophageal reflux disease)   . Glaucoma   . Heart failure, type unknown ([place]) 03/11/2017  . Hyperlipidemia 03/11/2017  . Hypothyroidism   . Pneumonia X 1   "years and years ago" (03/26/2017)  . Shortness of breath 03/11/2017  . Status post dilation of esophageal narrowing    Past Surgical History:  Procedure Laterality Date  . BILATERAL OOPHORECTOMY Bilateral   . CATARACT EXTRACTION    . CATARACT EXTRACTION W/ INTRAOCULAR LENS  IMPLANT, BILATERAL Bilateral   . CORONARY ANGIOPLASTY WITH STENT PLACEMENT  03/26/2017  . LEFT HEART CATH AND CORONARY ANGIOGRAPHY N/A 03/26/2017   Procedure: Left Heart Cath and Coronary Angiography;  Surgeon: Belva Crome, MD;  Location: [facility];  Service: Cardiovascular;  Laterality: N/A;  . NERVE SURGERY     "lump on my sciatic nerve was removed years ago"  . SHOULDER OPEN ROTATOR CUFF REPAIR Right   . THYROIDECTOMY, PARTIAL      FAMILY HISTORY Family History  Problem Relation Age of Onset  . CAD Mother   . Cataracts Mother   . Coronary artery disease Father   . Leukemia Sister   . Amblyopia Neg Hx   . Blindness Neg Hx   . Diabetes Neg Hx   . Glaucoma Neg Hx   . Macular degeneration Neg Hx   . Retinal detachment Neg Hx   . Strabismus Neg Hx   . Retinitis pigmentosa Neg Hx     SOCIAL HISTORY Social History   Tobacco Use  . Smoking status: Current Some Day Smoker    Packs/day: 0.50    Years: 60.00    Pack years: 30.00    Types: Cigarettes  . Smokeless tobacco: Never Used  Substance Use Topics  . Alcohol use: Yes     Comment: twice a month  . Drug use: No         OPHTHALMIC EXAM:  Base Eye Exam    Visual Acuity (Snellen - Linear)      Right Left   Dist cc CF 20/50   Dist ph cc NI NI   Correction:  Glasses       Tonometry (Tonopen, 1:13 PM)      Right Left   Pressure 13 15       Pupils      Dark Light Shape React APD   Right 4 3 Round Brisk None   Left 4  3 Round Brisk None       Visual Fields (Counting fingers)      Left Right    Full    Restrictions  Partial outer superior temporal, inferior temporal, superior nasal, inferior nasal deficiencies       Extraocular Movement      Right Left    Full, Ortho Full, Ortho       Neuro/Psych    Oriented x3:  Yes   Mood/Affect:  Normal       Dilation    Both eyes:  1.0% Mydriacyl, 2.5% Phenylephrine @ 1:14 PM        Slit Lamp and Fundus Exam    Slit Lamp Exam      Right Left   Lids/Lashes Dermatochalasis - upper lid, Telangiectasia Dermatochalasis - upper lid, Telangiectasia   Conjunctiva/Sclera White and quiet White and quiet   Cornea Arcus, 2+ diffuse Punctate epithelial erosions, diffuse endo pigment, decreased TBUT Arcus, 2+ diffuse Punctate epithelial erosions, diffuse endo pigment, decreased TBUT   Anterior Chamber Deep and quiet Deep and quiet   Iris Round and dilated Round and dilated   Lens Three piece PC IOL in good position, open PC Three piece PC IOL in good position, clear PC   Vitreous Vitreous syneresis, Posterior vitreous detachment Mild Vitreous syneresis, Posterior vitreous detachment       Fundus Exam      Right Left   Disc 360 Peripapillary atrophy 360 Peripapillary atrophy   C/D Ratio 0.2 0.2   Macula Drusen, RPE mottling and clumping, Subretinal hemorrhage, Intraretinal hemorrhage -- hemorrhages somewhat improved but persistent and still red in color, sub-retinal fibrosis forming with in area of hemorrhage, +PED, +CNVM Blunted foveal reflex, Drusen, RPE mottling and clumping, early Atrophy nasal to fovea    Vessels Vascular attenuation Vascular attenuation   Periphery Attached Attached          IMAGING AND PROCEDURES  Imaging and Procedures for @TODAY @  OCT, Retina - OU - Both Eyes       Right Eye Quality was good. Central Foveal Thickness: 480. Progression has improved. Findings include abnormal foveal contour, subretinal hyper-reflective material, pigment epithelial detachment, subretinal fluid, choroidal neovascular membrane, no IRF, retinal drusen , epiretinal membrane (Mild interval improvement in SRF).   Left Eye Quality was borderline. Central Foveal Thickness: 503. Progression has been stable. Findings include abnormal foveal contour, retinal drusen , no IRF, no SRF, outer retinal atrophy, pigment epithelial detachment.   Notes *Images captured and stored on drive  Diagnosis / Impression:  OD: Exudative AMD - mild interval improvement in SRF OS: Non-exudative AMD  Clinical management:  See below  Abbreviations: NFP - Normal foveal profile. CME - cystoid macular edema. PED - pigment epithelial detachment. IRF - intraretinal fluid. SRF - subretinal fluid. EZ - ellipsoid zone. ERM - epiretinal membrane. ORA - outer retinal atrophy. ORT - outer retinal tubulation. SRHM - subretinal hyper-reflective material         Intravitreal Injection, Pharmacologic Agent - OD - Right Eye       Time Out 03/19/2018. Confirmed correct patient, procedure, site, and patient consented.   Anesthesia Topical anesthesia was used. Anesthetic medications included Lidocaine 2%, Tetracaine 0.5%.   Procedure Preparation included 5% betadine to ocular surface. A 30 gauge needle was used.   Injection: 1.25 mg Bevacizumab 1.25mg /0.38ml   NDC: 00867-619-50    Lot: [id number]     Expiration Date: 05/23/2018   Route: Intravitreal   Site: Right Eye   Waste:  0 mg  Post-op Post injection exam found visual acuity of at least counting fingers. The patient tolerated the procedure well. There  were no complications. The patient received written and verbal post procedure care education.                 ASSESSMENT/PLAN:    ICD-10-CM   1. Exudative age-related macular degeneration of right eye with active choroidal neovascularization (HCC) H35.3211 Intravitreal Injection, Pharmacologic Agent - OD - Right Eye  2. Intermediate stage nonexudative age-related macular degeneration of left eye H35.3122   3. Retinal edema H35.81 OCT, Retina - OU - Both Eyes  4. Posterior vitreous detachment of both eyes H43.813   5. 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 Pseudophakia of both eyes Z96.1     1. 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 Kathlen Mody  - S/P IVA OD #1 (05.20.19), #2 (07.02.19)  - OCT with SRHM/PED with overlying SRF/heme  - [facility] stable today at [facility]  - discussed findings and guarded prognosis  - recommend IVA OD #3 today (7.30.19)  - pt wishes to be treated with IVA OD  - RBA of procedure discussed, questions answered  - informed consent obtained and signed  - see procedure note  - discussed possible switch in therapy  - [place] paperwork and benefits investigation started on 07.30.19  - f/u in 4 weeks  2. Age related macular degeneration, non-exudative, both eyes  - The incidence, anatomy, and pathology of dry AMD, risk of progression, and the AREDS and AREDS 2 study including smoking risks discussed with patient.  - continue amsler grid monitoring  3. Retinal edema as above  4. PVD / vitreous syneresis OU  Discussed findings and prognosis  No RT or RD on 360 peripheral exam  Reviewed s/s of RT/RD  Strict return precaution for any such RT/RD signs/symptoms  5. Pigmentary glaucoma OU-  - using latanoprost OU qhs - IOP good today - under the expert care of Dr. Quentin Ore - monitor  6. Pseudophakia OU  - s/p CE/IOL OU  - beautiful surgery, doing well  -  monitor    Ophthalmic Meds Ordered this visit:  No orders of the defined types were placed in this encounter.      Return in about 1 month (around 04/16/2018) for F/U Exu AMD OD, DFE, OCT.  There are no Patient Instructions on file for this visit.   Explained the diagnoses, plan, and follow up with the patient and they expressed understanding.  Patient expressed understanding of the importance of proper follow up care.   This document serves as a record of services personally performed by Gardiner Sleeper, MD, PhD. It was created on their behalf by Ernest Mallick, OA, an ophthalmic assistant. The creation of this record is the provider's dictation and/or activities during the visit.    Electronically signed by: Ernest Mallick, OA  07.29.2019 1:47 PM    Gardiner Sleeper, M.D., Ph.D. Diseases & Surgery of the Retina and Vitreous [facility]  I have reviewed the above documentation for accuracy and completeness, and I agree with the above. Gardiner Sleeper, M.D., Ph.D. 03/19/18 2:08 PM   Abbreviations: M myopia (nearsighted); A astigmatism; H hyperopia (farsighted); P presbyopia; Mrx spectacle prescription;  CTL contact lenses; OD right eye; OS left eye; OU both eyes  XT exotropia; ET esotropia; PEK punctate epithelial  keratitis; PEE punctate epithelial erosions; DES dry eye syndrome; MGD meibomian gland dysfunction; ATs artificial tears; PFAT's preservative free artificial tears; Corn nuclear sclerotic cataract; PSC posterior subcapsular cataract; ERM epi-retinal membrane; PVD posterior vitreous detachment; RD retinal detachment; DM diabetes mellitus; DR diabetic retinopathy; NPDR non-proliferative diabetic retinopathy; PDR proliferative diabetic retinopathy; CSME clinically significant macular edema; DME diabetic macular edema; dbh dot blot hemorrhages; CWS cotton wool spot; POAG primary open angle glaucoma; C/D cup-to-disc ratio; HVF humphrey visual field; GVF goldmann  visual field; OCT optical coherence tomography; IOP intraocular pressure; BRVO Branch retinal vein occlusion; CRVO central retinal vein occlusion; CRAO central retinal artery occlusion; BRAO branch retinal artery occlusion; RT retinal tear; SB scleral buckle; PPV pars plana vitrectomy; VH Vitreous hemorrhage; PRP panretinal laser photocoagulation; IVK intravitreal kenalog; VMT vitreomacular traction; MH Macular hole;  NVD neovascularization of the disc; NVE neovascularization elsewhere; AREDS age related eye disease study; ARMD age related macular degeneration; POAG primary open angle glaucoma; EBMD epithelial/anterior basement membrane dystrophy; ACIOL anterior chamber intraocular lens; IOL intraocular lens; PCIOL posterior chamber intraocular lens; Phaco/IOL phacoemulsification with intraocular lens placement; Diamond photorefractive keratectomy; LASIK laser assisted in situ keratomileusis; HTN hypertension; DM diabetes mellitus; COPD chronic obstructive pulmonary disease

## 2018-03-19 ENCOUNTER — Ambulatory Visit (INDEPENDENT_AMBULATORY_CARE_PROVIDER_SITE_OTHER): Payer: Medicare Other | Admitting: Ophthalmology

## 2018-03-19 ENCOUNTER — Encounter (INDEPENDENT_AMBULATORY_CARE_PROVIDER_SITE_OTHER): Payer: Self-pay | Admitting: Ophthalmology

## 2018-03-19 DIAGNOSIS — H3581 Retinal edema: Secondary | ICD-10-CM

## 2018-03-19 DIAGNOSIS — H353211 Exudative age-related macular degeneration, right eye, with active choroidal neovascularization: Secondary | ICD-10-CM

## 2018-03-19 DIAGNOSIS — H353122 Nonexudative age-related macular degeneration, left eye, intermediate dry stage: Secondary | ICD-10-CM

## 2018-03-19 DIAGNOSIS — H401331 Pigmentary glaucoma, bilateral, mild stage: Secondary | ICD-10-CM

## 2018-03-19 DIAGNOSIS — H43813 Vitreous degeneration, bilateral: Secondary | ICD-10-CM

## 2018-03-19 DIAGNOSIS — Z961 Presence of intraocular lens: Secondary | ICD-10-CM

## 2018-03-19 MED ORDER — BEVACIZUMAB CHEMO INJECTION 1.25MG/0.05ML SYRINGE FOR KALEIDOSCOPE
1.2500 mg | INTRAVITREAL | Status: DC
  Administered 2018-03-19: 1.25 mg via INTRAVITREAL

## 2018-04-11 ENCOUNTER — Encounter: Payer: Self-pay | Admitting: Cardiovascular Disease

## 2018-04-11 ENCOUNTER — Ambulatory Visit: Payer: Medicare Other | Admitting: Cardiovascular Disease

## 2018-04-11 VITALS — BP 118/70 | HR 76 | Ht 68.5 in | Wt 144.8 lb

## 2018-04-11 DIAGNOSIS — E7800 Pure hypercholesterolemia, unspecified: Secondary | ICD-10-CM

## 2018-04-11 DIAGNOSIS — Z955 Presence of coronary angioplasty implant and graft: Secondary | ICD-10-CM

## 2018-04-11 DIAGNOSIS — Z5181 Encounter for therapeutic drug level monitoring: Secondary | ICD-10-CM

## 2018-04-11 DIAGNOSIS — E785 Hyperlipidemia, unspecified: Secondary | ICD-10-CM

## 2018-04-11 DIAGNOSIS — I2510 Atherosclerotic heart disease of native coronary artery without angina pectoris: Secondary | ICD-10-CM | POA: Diagnosis not present

## 2018-04-11 MED ORDER — CARVEDILOL 3.125 MG PO TABS: 3.1250 mg | ORAL_TABLET | Freq: Two times a day (BID) | ORAL | 3 refills | Status: DC

## 2018-04-11 NOTE — Progress Notes (Signed)
Cardiology Office Note   Date:  06/25/2017   ID:  Marissa Caldwell, DOB 03-04-1932, MRN [id number]  PCP:  [name], MD  Cardiologist:   Skeet Latch, MD   No chief complaint on file.    History of Present Illness: Marissa Caldwell is a 84 y.o. female with CAD status post left circumflex PCI, chronic diastolic heart failure, hyperlipidemia, hypothyroidism, COPD, prior tobacco abuse, and deafness here for follow-up.  She was first seen 02/2017 for heart failure.  Her PCP noted edema, orthopnea, and PND.  BNP was elevated.  She  had an echo 03/15/17 that revealed LVEF 60-65% with grade 1 diastolic dysfunction and mild mitral regurgitation.  At that appointment she also reported atypical chest pain.  She was referred for a Lexiscan Myoview 03/16/17 that revealed an anterior perfusion defect.  LVEF was reportedly 73%.  She underwent left heart catheterization 03/26/17 and was found to have 95% stenosis of the distal left circumflex artery.  She underwent PCI with a drug-eluting stent and there were no complications.  She was started on atorvastatin  Marissa Caldwell has been doing well.  She stays active by walking regularly.  She has no exertional chest pain or shortness of breath.  She also denies lower extremity edema, orthopnea or PND.  She has no complaints at this time. Since her last appointment she developed elevated LFTs on atorvastatin.  This has since resolved.  She does not drink alcohol and rarely uses Tylenol.     Past Medical History:  Diagnosis Date  . Anxiety   . CAD (coronary artery disease)    a. 03/2017: 95% LCx stenosis --> PCI/DES placed  . Chest pain 03/27/2017  . Chronic lower back pain   . COPD (chronic obstructive pulmonary disease) ([place])   . Coronary artery disease   . GERD (gastroesophageal reflux disease)   . Heart failure, type unknown ([place]) 03/11/2017  . Hyperlipidemia 03/11/2017  . Hypothyroidism   . Pneumonia X 1   "years and years ago" (03/26/2017)  .  Shortness of breath 03/11/2017    Past Surgical History:  Procedure Laterality Date  . BILATERAL OOPHORECTOMY Bilateral   . CATARACT EXTRACTION W/ INTRAOCULAR LENS  IMPLANT, BILATERAL Bilateral   . CORONARY ANGIOPLASTY WITH STENT PLACEMENT  03/26/2017  . NERVE SURGERY     "lump on my sciatic nerve was removed years ago"  . SHOULDER OPEN ROTATOR CUFF REPAIR Right   . THYROIDECTOMY, PARTIAL       Current Outpatient Medications  Medication Sig Dispense Refill  . albuterol (PROVENTIL HFA;VENTOLIN HFA) 108 (90 Base) MCG/ACT inhaler Inhale 2 puffs into the lungs every 6 (six) hours as needed (for shortness of breath/wheezing.).     Marland Kitchen alendronate (FOSAMAX) 70 MG tablet Take 70 mg by mouth once a week. Take with a full glass of water on an empty stomach.    Marland Kitchen aspirin 81 MG chewable tablet Chew 1 tablet (81 mg total) by mouth daily.    Marland Kitchen atorvastatin (LIPITOR) 80 MG tablet Take 1 tablet (80 mg total) by mouth daily. 30 tablet 6  . budesonide-formoterol (SYMBICORT) 160-4.5 MCG/ACT inhaler Inhale 2 puffs into the lungs 2 (two) times daily.    . Calcium Carb-Cholecalciferol (CALCIUM + D3) 600-200 MG-UNIT TABS Take 1 tablet by mouth daily.    . Cholecalciferol (VITAMIN D3) 2000 units capsule Take 4,000 Units by mouth daily.    . clopidogrel (PLAVIX) 75 MG tablet Take 1 tablet (75 mg total) by mouth  daily with breakfast. 30 tablet 11  . Coenzyme Q10 (COQ10) 100 MG CAPS Take 100 mg by mouth daily.    . dorzolamide (TRUSOPT) 2 % ophthalmic solution Place 1 drop into both eyes 2 (two) times daily.    Marland Kitchen latanoprost (XALATAN) 0.005 % ophthalmic solution Place 1 drop into both eyes at bedtime.    Marland Kitchen levothyroxine (SYNTHROID, LEVOTHROID) 112 MCG tablet Take 112 mcg daily before breakfast by mouth.    Marland Kitchen LORazepam (ATIVAN) 0.5 MG tablet Take 0.5 mg by mouth at bedtime.     . LUTEIN PO Take 1 tablet by mouth daily.    . Multiple Vitamin (MULTIVITAMIN) tablet Take 1 tablet by mouth daily.    . nitroGLYCERIN  (NITROSTAT) 0.4 MG SL tablet Place 1 tablet (0.4 mg total) under the tongue every 5 (five) minutes as needed for chest pain. 25 tablet 4  . Omega-3 Fatty Acids (FISH OIL PO) Take 5 mLs by mouth daily.    . pantoprazole (PROTONIX) 40 MG tablet Take 1 tablet (40 mg total) by mouth daily. 30 tablet 6  . sertraline (ZOLOFT) 50 MG tablet Take 50 mg by mouth daily as needed (for anxiety.).      No current facility-administered medications for this visit.     Allergies:   Patient has no known allergies.    Social History:  The patient  reports that she quit smoking about 2 years ago. Her smoking use included cigarettes. She has a 30.00 pack-year smoking history. she has never used smokeless tobacco. She reports that she does not drink alcohol or use drugs.   Family History:  The patient's family history includes CAD in her mother; Coronary artery disease in her father; Leukemia in her sister.    ROS:  Please see the history of present illness.   Otherwise, review of systems are positive for sore legs.   All other systems are reviewed and negative.    PHYSICAL EXAM: VS:  BP 118/70   Pulse 76   Ht 5' 8.5" (1.74 m)   Wt 144 lb 12.8 oz (65.7 kg)   BMI 21.70 kg/m  , BMI Body mass index is 21.7 kg/m. GENERAL:  Well appearing HEENT: Pupils equal round and reactive, fundi not visualized, oral mucosa unremarkable NECK:  No jugular venous distention, waveform within normal limits, carotid upstroke brisk and symmetric, no bruits LUNGS:  Clear to auscultation bilaterally HEART:  RRR.  PMI not displaced or sustained,S1 and S2 within normal limits, no S3, no S4, no clicks, no rubs, no murmurs ABD:  Flat, positive bowel sounds normal in frequency in pitch, no bruits, no rebound, no guarding, no midline pulsatile mass, no hepatomegaly, no splenomegaly EXT:  2 plus pulses throughout, no edema, no cyanosis no clubbing SKIN:  No rashes no nodules NEURO:  Cranial nerves II through XII grossly intact, motor  grossly intact throughout PSYCH:  Cognitively intact, oriented to person place and time   EKG:  EKG is ordered today. The ekg ordered today demonstrates sinus bradycardia.  Rate 56 bpm.   06/10/12: Sinus rhythm. Rate 67 bpm. 04/11/18: Sinus rhythm.  Rate 76 bpm.   Lexiscan Myoview 03/16/17:  The left ventricular ejection fraction is hyperdynamic (>65%).  Nuclear stress EF: 73%.  No T wave inversion was noted during stress.  There was no ST segment deviation noted during stress.  Defect 1: There is a medium defect of moderate severity.  Findings consistent with ischemia.  This is an intermediate risk study.  Medium size, moderate intensity reversible (SDS 4) anterior perfusion defect suggestive of ischemia. LVEF 73% with normal wall motion. This is an intermediate risk study.  Echo 03/15/17: Study Conclusions  - Left ventricle: The cavity size was normal. There was severe   focal basal and mild concentric hypertrophy. Systolic function   was normal. The estimated ejection fraction was in the range of   60% to 65%. Wall motion was normal; there were no regional wall   motion abnormalities. There was an increased relative   contribution of atrial contraction to ventricular filling.   Doppler parameters are consistent with abnormal left ventricular   relaxation (grade 1 diastolic dysfunction). Doppler parameters   are consistent with high ventricular filling pressure. - Aortic valve: There was trivial regurgitation. - Mitral valve: There was mild regurgitation. Valve area by   pressure half-time: 2.44 cm^2. - Pulmonic valve: There was mild regurgitation.  Left heart catheterization 03/26/17:  Dominant circumflex with distal 95% stenosis beyond which an inferolateral wall obtuse marginal and the PDA arise.  Otherwise widely patent coronary arteries with marked tortuosity and angulation. Right coronary is nondominant.  Successful DES implantation reducing the 95% stenosis to  0% with TIMI grade 3 flow using a 3.0 x 15 mm study stent.  Chronic diastolic heart failure with LVEDP of 20 mmHg and normal overall systolic function with EF 60%.   Recent Labs: 03/26/2017: Hemoglobin 12.0; Platelets 270 03/27/2017: BUN 10; Creatinine, Ser 0.65; Potassium 3.5; Sodium 137 06/06/2017: ALT 65   02/12/17: Sodium 135, potassium 3.5, UA and 20, creatinine 0.63 AST 25, ALT 21 Total cholesterol 196, triglycerides 79, HDL 78, LDL 102 TSH 3.38  Lipid Panel    Component Value Date/Time   CHOL 147 06/06/2017 0859   TRIG 85 06/06/2017 0859   HDL 59 06/06/2017 0859   CHOLHDL 2.5 06/06/2017 0859   LDLCALC 71 06/06/2017 0859   10/16/2017: Total cholesterol 152, triglycerides 55, HDL 79, LDL 62 Potassium 4.2, creatinine 0.73 12/31/2017: ALT 38    Wt Readings from Last 3 Encounters:  06/25/17 72.6 kg (160 lb)  04/13/17 72.2 kg (159 lb 3.2 oz)  03/27/17 72 kg (158 lb 11.7 oz)      ASSESSMENT AND PLAN:  # CAD s/p LCx PCI:  Doing well without angina.  Continue aspirin and clopidogrel.  She had some bradycardia in the past but we will try carvedilol 3.125mg  bid.  LDL 62 09/2017.  Continue pravastatin.   # Chronic diastolic heart failure, type unknown:  Marissa Caldwell appears euvolemic.  She has no heart failure symptoms or volume overload on exam.  She had elevated LVEDP on cath and grade 1 diastolic function on echo.    # Hyperlipidemia: LFTs were elevated on atorvastatin.  This has resolved.  We will try low dose pravastatin 20mg  daily.  Repeat lipids/LFTs in 6 weeks.   Current medicines are reviewed at length with the patient today.  The patient does not have concerns regarding medicines.  The following changes have been made:  Stop clopidogrel and protonix.  Start pravastatin and carvedilol.  Labs/ tests ordered today include:   No orders of the defined types were placed in this encounter.    Disposition:   FU with Prentis Langdon C. Oval Linsey, MD, [facility] in 2 months.      Signed, Alisha Bacus C. Oval Linsey, MD, [facility]  06/25/2017 11:39 AM    [place]

## 2018-04-11 NOTE — Patient Instructions (Signed)
Medication Instructions:  STOP CLOPIDOGREL (PLAVIX)  STOP PANTOPRAZOLE (PROTONIX)  START CARVEDILOL 3.125 MG TWICE A DAY   Labwork: FASTING LP/CMET SOON   Testing/Procedures: NONE  Follow-Up: Your physician recommends that you schedule a follow-up appointment in: 2 MONTHS   If you need a refill on your cardiac medications before your next appointment, please call your pharmacy.

## 2018-04-12 ENCOUNTER — Telehealth: Payer: Self-pay | Admitting: *Deleted

## 2018-04-12 NOTE — Telephone Encounter (Signed)
[employer] Research study call: Patient was seen yesterday by Dr. Oval Linsey. Patient denies any chest pain or other adverse events. Patient was instructed to stop Plavix and Protonix continue ASA. No other changes. EKG today was sinus brady. I have ask patient if she would mind dropping by the research office for 12 month visit. She states she has transportation issues and thought that Dr. Blenda Mounts visit was for research study. I thanked her for her participation and will call her in 1 year for the next research required visit.

## 2018-04-15 NOTE — Progress Notes (Signed)
[facility] Note  04/16/2018     CHIEF COMPLAINT Patient presents for Retina Follow Up   HISTORY OF PRESENT ILLNESS: Marissa Caldwell is a 84 y.o. female who presents to the clinic today for:   HPI    Retina Follow Up    Patient presents with  Wet AMD.  In right eye.  This started 3 months ago.  Severity is mild.  Since onset it is stable.  I, the attending physician,  performed the HPI with the patient and updated documentation appropriately.          Comments    F/U [facility] OD. Patient states she was having floaters OD until today, blurriness OS contiunes. Patient is using Latanoprost QID OU and appling warm compresses  OU PRN. Pt is ready for tx today if indicted.       Last edited by Bernarda Caffey, MD on 04/16/2018  1:22 PM. (History)    Pt states she tolerated last injection well but denies any change in [place];    Referring physician: [name], MD [address] [place], [place] [address]  HISTORICAL INFORMATION:   Selected notes from the MEDICAL RECORD NUMBER Referred by Dr. Quentin Ore for concern of exudative ARMD OD LEE: 05.16.19 (C.We[place]) [BCVA: OD: 20/60 OS: 20/70] Ocular Hx-Psedudophakia OU, Glaucoma suspect, non-exudative ARMD OS, [place] PMH-HTN, Asthma    CURRENT MEDICATIONS: Current Outpatient Medications (Ophthalmic Drugs)  Medication Sig  . dorzolamide (TRUSOPT) 2 % ophthalmic solution Place 1 drop into both eyes 2 (two) times daily.  Marland Kitchen latanoprost (XALATAN) 0.005 % ophthalmic solution Place 1 drop into both eyes at bedtime.   Current Facility-Administered Medications (Ophthalmic Drugs)  Medication Route  . aflibercept (EYLEA) SOLN 2 mg Intravitreal   Current Outpatient Medications (Other)  Medication Sig  . albuterol (PROVENTIL HFA;VENTOLIN HFA) 108 (90 Base) MCG/ACT inhaler Inhale 2 puffs into the lungs every 6 (six) hours as needed (for shortness of breath/wheezing.).   Marland Kitchen alendronate (FOSAMAX) 70 MG  tablet Take 70 mg by mouth once a week. Take with a full glass of water on an empty stomach.  Marland Kitchen aspirin 81 MG chewable tablet Chew 1 tablet (81 mg total) by mouth daily.  . budesonide-formoterol (SYMBICORT) 160-4.5 MCG/ACT inhaler Inhale 2 puffs into the lungs 2 (two) times daily.  . Calcium Carb-Cholecalciferol (CALCIUM + D3) 600-200 MG-UNIT TABS Take 1 tablet by mouth daily.  . carvedilol (COREG) 3.125 MG tablet Take 1 tablet (3.125 mg total) by mouth 2 (two) times daily.  . Cholecalciferol (VITAMIN D3) 2000 units capsule Take 4,000 Units by mouth daily.  . Coenzyme Q10 (COQ10) 100 MG CAPS Take 100 mg by mouth daily.  Marland Kitchen levothyroxine (SYNTHROID, LEVOTHROID) 112 MCG tablet Take 112 mcg daily before breakfast by mouth.  Marland Kitchen LORazepam (ATIVAN) 0.5 MG tablet Take 0.5 mg by mouth at bedtime.   . LUTEIN PO Take 1 tablet by mouth daily.  . Multiple Vitamin (MULTIVITAMIN) tablet Take 1 tablet by mouth daily.  . nitroGLYCERIN (NITROSTAT) 0.4 MG SL tablet Place 1 tablet (0.4 mg total) under the tongue every 5 (five) minutes as needed for chest pain.  . Omega-3 Fatty Acids (FISH OIL PO) Take 5 mLs by mouth daily.  . sertraline (ZOLOFT) 50 MG tablet Take 50 mg by mouth daily as needed (for anxiety.).    Current Facility-Administered Medications (Other)  Medication Route  . Bevacizumab (AVASTIN) SOLN 1.25 mg Intravitreal  . Bevacizumab (AVASTIN) SOLN 1.25  mg Intravitreal  . Bevacizumab (AVASTIN) SOLN 1.25 mg Intravitreal      REVIEW OF SYSTEMS: ROS    Positive for: Eyes   Negative for: Constitutional, Gastrointestinal, Neurological, Skin, Genitourinary, Musculoskeletal, HENT, Endocrine, Cardiovascular, Respiratory, Psychiatric, Heme/Lymph   Last edited by Zenovia Jordan, LPN on 08/21/7508  2:58 PM. (History)       ALLERGIES Allergies  Allergen Reactions  . Lipitor [Atorvastatin]     Elevated LFT's    PAST MEDICAL HISTORY Past Medical History:  Diagnosis Date  . Anxiety   . Arthritis    . CAD (coronary artery disease)    a. 03/2017: 95% LCx stenosis --> PCI/DES placed  . Cataract   . Chest pain 03/27/2017  . Chronic diastolic heart failure ([place]) 06/25/2017  . Chronic lower back pain   . COPD (chronic obstructive pulmonary disease) ([place])   . Coronary artery disease   . Diverticulitis   . Diverticulosis   . GERD (gastroesophageal reflux disease)   . Glaucoma   . Heart failure, type unknown ([place]) 03/11/2017  . Hyperlipidemia 03/11/2017  . Hypothyroidism   . Pneumonia X 1   "years and years ago" (03/26/2017)  . Shortness of breath 03/11/2017  . Status post dilation of esophageal narrowing    Past Surgical History:  Procedure Laterality Date  . BILATERAL OOPHORECTOMY Bilateral   . CATARACT EXTRACTION    . CATARACT EXTRACTION W/ INTRAOCULAR LENS  IMPLANT, BILATERAL Bilateral   . CORONARY ANGIOPLASTY WITH STENT PLACEMENT  03/26/2017  . LEFT HEART CATH AND CORONARY ANGIOGRAPHY N/A 03/26/2017   Procedure: Left Heart Cath and Coronary Angiography;  Surgeon: Belva Crome, MD;  Location: [facility];  Service: Cardiovascular;  Laterality: N/A;  . NERVE SURGERY     "lump on my sciatic nerve was removed years ago"  . SHOULDER OPEN ROTATOR CUFF REPAIR Right   . THYROIDECTOMY, PARTIAL      FAMILY HISTORY Family History  Problem Relation Age of Onset  . CAD Mother   . Cataracts Mother   . Coronary artery disease Father   . Leukemia Sister   . Amblyopia Neg Hx   . Blindness Neg Hx   . Diabetes Neg Hx   . Glaucoma Neg Hx   . Macular degeneration Neg Hx   . Retinal detachment Neg Hx   . Strabismus Neg Hx   . Retinitis pigmentosa Neg Hx     SOCIAL HISTORY Social History   Tobacco Use  . Smoking status: Current Some Day Smoker    Packs/day: 0.50    Years: 60.00    Pack years: 30.00    Types: Cigarettes  . Smokeless tobacco: Never Used  Substance Use Topics  . Alcohol use: Yes    Comment: twice a month  . Drug use: No         OPHTHALMIC EXAM:  Base  Eye Exam    Visual Acuity (Snellen - Linear)      Right Left   Dist cc CF at 3' 20/50 +1   Dist ph cc  20/50 +2   Correction:  Glasses       Tonometry (Tonopen, 1:07 PM)      Right Left   Pressure 15 18       Pupils      Dark Light Shape React APD   Right 4 3 Round Brisk None   Left 4 3 Round Brisk None       Visual Fields      Left  Right   Restrictions  Partial outer superior temporal, inferior temporal, superior nasal, inferior nasal deficiencies       Extraocular Movement      Right Left    Full, Ortho Full, Ortho       Neuro/Psych    Oriented x3:  Yes   Mood/Affect:  Normal       Dilation    Both eyes:  1.0% Mydriacyl, 2.5% Phenylephrine @ 1:07 PM        Slit Lamp and Fundus Exam    Slit Lamp Exam      Right Left   Lids/Lashes Dermatochalasis - upper lid, Telangiectasia Dermatochalasis - upper lid, Telangiectasia   Conjunctiva/Sclera White and quiet White and quiet   Cornea Arcus, 2+ diffuse Punctate epithelial erosions, diffuse endo pigment, decreased TBUT Arcus, 2+ diffuse Punctate epithelial erosions, diffuse endo pigment, decreased TBUT   Anterior Chamber Deep and quiet Deep and quiet   Iris Round and dilated Round and dilated   Lens Three piece PC IOL in good position, open PC Three piece PC IOL in good position, clear PC   Vitreous Vitreous syneresis, Posterior vitreous detachment Mild Vitreous syneresis, Posterior vitreous detachment       Fundus Exam      Right Left   Disc 360 Peripapillary atrophy 360 Peripapillary atrophy   C/D Ratio 0.2 0.2   Macula Drusen, RPE mottling and clumping, Subretinal hemorrhage - turning white/fibrotic, +PED, +CNVM Blunted foveal reflex, Drusen, RPE mottling and clumping, early Atrophy   Vessels Vascular attenuation Vascular attenuation   Periphery Attached Attached          IMAGING AND PROCEDURES  Imaging and Procedures for @TODAY @  OCT, Retina - OU - Both Eyes       Right Eye Quality was good. Central  Foveal Thickness: 349. Progression has improved. Findings include abnormal foveal contour, subretinal hyper-reflective material, pigment epithelial detachment, subretinal fluid, choroidal neovascular membrane, retinal drusen , epiretinal membrane, intraretinal fluid (Interval improvement in IRF/SRF).   Left Eye Quality was good. Central Foveal Thickness: 224. Progression has been stable. Findings include abnormal foveal contour, retinal drusen , no IRF, no SRF, outer retinal atrophy, pigment epithelial detachment.   Notes *Images captured and stored on drive  Diagnosis / Impression:  OD: Exudative AMD - interval improvement in IRF/SRF, but large residual SRHM/PED component OS: Non-exudative AMD  Clinical management:  See below  Abbreviations: NFP - Normal foveal profile. CME - cystoid macular edema. PED - pigment epithelial detachment. IRF - intraretinal fluid. SRF - subretinal fluid. EZ - ellipsoid zone. ERM - epiretinal membrane. ORA - outer retinal atrophy. ORT - outer retinal tubulation. SRHM - subretinal hyper-reflective material         Intravitreal Injection, Pharmacologic Agent - OD - Right Eye       Time Out 04/16/2018. 1:50 PM. Confirmed correct patient, procedure, site, and patient consented.   Anesthesia Topical anesthesia was used. Anesthetic medications included Lidocaine 2%, Tetracaine 0.5%.   Procedure Preparation included 5% betadine to ocular surface, eyelid speculum. A 30 gauge needle was used.   Injection:  2 mg aflibercept 2 MG/0.05ML   NDC: 61755-005-02, Lot: [id number], Expiration date: 01/18/2019   Route: Intravitreal, Site: Right Eye, Waste: 0.05 mL  Post-op Post injection exam found visual acuity of at least counting fingers. The patient tolerated the procedure well. There were no complications. The patient received written and verbal post procedure care education.  ASSESSMENT/PLAN:    ICD-10-CM   1. Exudative age-related  macular degeneration of right eye with active choroidal neovascularization (HCC) H35.3211 OCT, Retina - OU - Both Eyes    Intravitreal Injection, Pharmacologic Agent - OD - Right Eye    aflibercept (EYLEA) SOLN 2 mg  2. Intermediate stage nonexudative age-related macular degeneration of left eye H35.3122   3. Retinal edema H35.81 OCT, Retina - OU - Both Eyes  4. Posterior vitreous detachment of both eyes H43.813   5. 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 Pseudophakia of both eyes Z96.1     1. 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 Kathlen Mody  - S/P IVA OD #1 (05.20.19), #2 (07.02.19), #3 (07.30.19)  - OCT with SRHM/PED with overlying SRF/heme -- mild interval improvement in IRF/SRF  - VA stable at [facility]  - discussed findings and guarded prognosis  - discussed switch in therapy to [facility] -- approved through [employer]  - recommend IVE OD #1 today (08.27.19)  - pt wishes to be treated with IVE OD  - RBA of procedure discussed, questions answered  - informed consent obtained and signed  - see procedure note  - Eylea paperwork and benefits investigation started on 07.30.19  - Approved through [employer] for Eylea  - f/u in 4 weeks  2. Age related macular degeneration, non-exudative, both eyes  - The incidence, anatomy, and pathology of dry AMD, risk of progression, and the AREDS and AREDS 2 study including smoking risks discussed with patient.  - continue amsler grid monitoring  3. Retinal edema as above  4. PVD / vitreous syneresis OU  Discussed findings and prognosis  No RT or RD on 360 peripheral exam  Reviewed s/s of RT/RD  Strict return precaution for any such RT/RD signs/symptoms   5. Pigmentary glaucoma OU-  - using latanoprost OU qhs - IOP good today - under the expert care of Dr. Quentin Ore - monitor  6. Pseudophakia OU  - s/p CE/IOL OU  - beautiful surgery, doing well  -  monitor   Ophthalmic Meds Ordered this visit:  Meds ordered this encounter  Medications  . aflibercept (EYLEA) SOLN 2 mg       Return in about 4 weeks (around 05/14/2018) for F/U Exu AMD OD, DFE, OCT.  There are no Patient Instructions on file for this visit.   Explained the diagnoses, plan, and follow up with the patient and they expressed understanding.  Patient expressed understanding of the importance of proper follow up care.   This document serves as a record of services personally performed by Gardiner Sleeper, MD, PhD. It was created on their behalf by Ernest Mallick, OA, an ophthalmic assistant. The creation of this record is the provider's dictation and/or activities during the visit.    Electronically signed by: Ernest Mallick, OA  08.26.2019 2:25 PM    Gardiner Sleeper, M.D., Ph.D. Diseases & Surgery of the Retina and Vitreous [facility]   I have reviewed the above documentation for accuracy and completeness, and I agree with the above. Gardiner Sleeper, M.D., Ph.D. 04/16/18 2:28 PM    Abbreviations: M myopia (nearsighted); A astigmatism; H hyperopia (farsighted); P presbyopia; Mrx spectacle prescription;  CTL contact lenses; OD right eye; OS left eye; OU both eyes  XT exotropia; ET esotropia; PEK punctate epithelial keratitis; PEE punctate epithelial erosions; DES dry  eye syndrome; MGD meibomian gland dysfunction; ATs artificial tears; PFAT's preservative free artificial tears; [place] nuclear sclerotic cataract; PSC posterior subcapsular cataract; ERM epi-retinal membrane; PVD posterior vitreous detachment; RD retinal detachment; DM diabetes mellitus; DR diabetic retinopathy; NPDR non-proliferative diabetic retinopathy; PDR proliferative diabetic retinopathy; CSME clinically significant macular edema; DME diabetic macular edema; dbh dot blot hemorrhages; CWS cotton wool spot; POAG primary open angle glaucoma; C/D cup-to-disc ratio; HVF humphrey visual field;  GVF goldmann visual field; OCT optical coherence tomography; IOP intraocular pressure; BRVO Branch retinal vein occlusion; CRVO central retinal vein occlusion; CRAO central retinal artery occlusion; BRAO branch retinal artery occlusion; RT retinal tear; SB scleral buckle; PPV pars plana vitrectomy; VH Vitreous hemorrhage; PRP panretinal laser photocoagulation; IVK intravitreal kenalog; VMT vitreomacular traction; MH Macular hole;  NVD neovascularization of the disc; NVE neovascularization elsewhere; AREDS age related eye disease study; ARMD age related macular degeneration; POAG primary open angle glaucoma; EBMD epithelial/anterior basement membrane dystrophy; ACIOL anterior chamber intraocular lens; IOL intraocular lens; PCIOL posterior chamber intraocular lens; Phaco/IOL phacoemulsification with intraocular lens placement; [place] photorefractive keratectomy; LASIK laser assisted in situ keratomileusis; HTN hypertension; DM diabetes mellitus; COPD chronic obstructive pulmonary disease

## 2018-04-16 ENCOUNTER — Encounter (INDEPENDENT_AMBULATORY_CARE_PROVIDER_SITE_OTHER): Payer: Self-pay | Admitting: Ophthalmology

## 2018-04-16 ENCOUNTER — Ambulatory Visit (INDEPENDENT_AMBULATORY_CARE_PROVIDER_SITE_OTHER): Payer: Medicare Other | Admitting: Ophthalmology

## 2018-04-16 DIAGNOSIS — H401331 Pigmentary glaucoma, bilateral, mild stage: Secondary | ICD-10-CM

## 2018-04-16 DIAGNOSIS — H353211 Exudative age-related macular degeneration, right eye, with active choroidal neovascularization: Secondary | ICD-10-CM | POA: Diagnosis not present

## 2018-04-16 DIAGNOSIS — Z5181 Encounter for therapeutic drug level monitoring: Secondary | ICD-10-CM | POA: Diagnosis not present

## 2018-04-16 DIAGNOSIS — H43813 Vitreous degeneration, bilateral: Secondary | ICD-10-CM

## 2018-04-16 DIAGNOSIS — Z961 Presence of intraocular lens: Secondary | ICD-10-CM

## 2018-04-16 DIAGNOSIS — H353122 Nonexudative age-related macular degeneration, left eye, intermediate dry stage: Secondary | ICD-10-CM

## 2018-04-16 DIAGNOSIS — H3581 Retinal edema: Secondary | ICD-10-CM

## 2018-04-16 DIAGNOSIS — E7800 Pure hypercholesterolemia, unspecified: Secondary | ICD-10-CM | POA: Diagnosis not present

## 2018-04-16 LAB — COMPREHENSIVE METABOLIC PANEL
ALT: 19 IU/L (ref 0–32)
AST: 29 IU/L (ref 0–40)
Albumin/Globulin Ratio: 1.4 (ref 1.2–2.2)
Albumin: 4 g/dL (ref 3.5–4.7)
Alkaline Phosphatase: 80 IU/L (ref 39–117)
BUN/Creatinine Ratio: 24 (ref 12–28)
BUN: 18 mg/dL (ref 8–27)
Bilirubin Total: 0.3 mg/dL (ref 0.0–1.2)
CO2: 23 mmol/L (ref 20–29)
Calcium: 9.5 mg/dL (ref 8.7–10.3)
Chloride: 97 mmol/L (ref 96–106)
Creatinine, Ser: 0.76 mg/dL (ref 0.57–1.00)
GFR calc Af Amer: 83 mL/min/{1.73_m2} (ref >59)
GFR calc non Af Amer: 72 mL/min/{1.73_m2} (ref >59)
Globulin, Total: 2.8 g/dL (ref 1.5–4.5)
Glucose: 93 mg/dL (ref 65–99)
Potassium: 4.5 mmol/L (ref 3.5–5.2)
Sodium: 136 mmol/L (ref 134–144)
Total Protein: 6.8 g/dL (ref 6.0–8.5)

## 2018-04-16 LAB — LIPID PANEL
Chol/HDL Ratio: 3.6 ratio (ref 0.0–4.4)
Cholesterol, Total: 264 mg/dL — ABNORMAL HIGH (ref 100–199)
HDL: 74 mg/dL (ref >39)
LDL Calculated: 174 mg/dL — ABNORMAL HIGH (ref 0–99)
Triglycerides: 81 mg/dL (ref 0–149)
VLDL Cholesterol Cal: 16 mg/dL (ref 5–40)

## 2018-04-16 MED ORDER — AFLIBERCEPT 2MG/0.05ML IZ SOLN FOR KALEIDOSCOPE
2.0000 mg | INTRAVITREAL | Status: DC
  Administered 2018-04-16: 2 mg via INTRAVITREAL

## 2018-04-18 ENCOUNTER — Telehealth: Payer: Self-pay | Admitting: *Deleted

## 2018-04-18 MED ORDER — PRAVASTATIN SODIUM 20 MG PO TABS: 20.0000 mg | ORAL_TABLET | Freq: Every day | ORAL | 5 refills | Status: DC

## 2018-04-18 NOTE — Telephone Encounter (Signed)
-----   Message from Skeet Latch, MD sent at 04/18/2018  5:36 AM EDT ----- Cholesterol levels are poorly controlled as expected since you had to stop her statin.  Normal kidney function, liver function, and electrolytes.  Recommend trying low-dose of a different statin that is less likely to affect her liver.  We will try pravastatin 20 mg.  Repeat LFTs in 1 month.

## 2018-04-18 NOTE — Telephone Encounter (Signed)
Left message to call back  

## 2018-04-18 NOTE — Telephone Encounter (Signed)
Advised patient of lab results and agreeable with starting new medication. She will have labs at follow up in 1 month.

## 2018-04-23 ENCOUNTER — Encounter: Payer: Self-pay | Admitting: Cardiovascular Disease

## 2018-05-13 NOTE — Progress Notes (Signed)
[facility] Note  05/14/2018     CHIEF COMPLAINT Patient presents for Retina Follow Up   HISTORY OF PRESENT ILLNESS: Marissa Caldwell is a 84 y.o. female who presents to the clinic today for:   HPI    Retina Follow Up    Patient presents with  Wet AMD.  In right eye.  This started 2 months ago.  Severity is mild.  Since onset it is stable.  I, the attending physician,  performed the HPI with the patient and updated documentation appropriately.          Comments    F/U EXU AMD OD. Patient states her eye has been watering a lot and sometimes achy OD. Pt states "it may be allergy's". Pt is use dry eye gtt's PRN. Pt ready for tx today if indicted.       Last edited by Bernarda Caffey, MD on 05/14/2018  1:41 PM. (History)    Pt states she has not noted any change in OU VA since being seen last;   Referring physician: [name], MD [place], [place] [address]  HISTORICAL INFORMATION:   Selected notes from the MEDICAL RECORD NUMBER Referred by Dr. Quentin Ore for concern of exudative ARMD OD LEE: 05.16.19 (C.Weaver) [BCVA: OD: 20/60 OS: 20/70] Ocular Hx-Psedudophakia OU, Glaucoma suspect, non-exudative ARMD OS, [place]ag PMH-HTN, Asthma    CURRENT MEDICATIONS: Current Outpatient Medications (Ophthalmic Drugs)  Medication Sig  . dorzolamide (TRUSOPT) 2 % ophthalmic solution Place 1 drop into both eyes 2 (two) times daily.  Marland Kitchen latanoprost (XALATAN) 0.005 % ophthalmic solution Place 1 drop into both eyes at bedtime.   Current Facility-Administered Medications (Ophthalmic Drugs)  Medication Route  . aflibercept (EYLEA) SOLN 2 mg Intravitreal  . aflibercept (EYLEA) SOLN 2 mg Intravitreal   Current Outpatient Medications (Other)  Medication Sig  . albuterol (PROVENTIL HFA;VENTOLIN HFA) 108 (90 Base) MCG/ACT inhaler Inhale 2 puffs into the lungs every 6 (six) hours as needed (for shortness of breath/wheezing.).   Marland Kitchen alendronate  (FOSAMAX) 70 MG tablet Take 70 mg by mouth once a week. Take with a full glass of water on an empty stomach.  Marland Kitchen aspirin 81 MG chewable tablet Chew 1 tablet (81 mg total) by mouth daily.  . budesonide-formoterol (SYMBICORT) 160-4.5 MCG/ACT inhaler Inhale 2 puffs into the lungs 2 (two) times daily.  . Calcium Carb-Cholecalciferol (CALCIUM + D3) 600-200 MG-UNIT TABS Take 1 tablet by mouth daily.  . carvedilol (COREG) 3.125 MG tablet Take 1 tablet (3.125 mg total) by mouth 2 (two) times daily.  . Cholecalciferol (VITAMIN D3) 2000 units capsule Take 4,000 Units by mouth daily.  . Coenzyme Q10 (COQ10) 100 MG CAPS Take 100 mg by mouth daily.  Marland Kitchen levothyroxine (SYNTHROID, LEVOTHROID) 112 MCG tablet Take 112 mcg daily before breakfast by mouth.  Marland Kitchen LORazepam (ATIVAN) 0.5 MG tablet Take 0.5 mg by mouth at bedtime.   . LUTEIN PO Take 1 tablet by mouth daily.  . Multiple Vitamin (MULTIVITAMIN) tablet Take 1 tablet by mouth daily.  . nitroGLYCERIN (NITROSTAT) 0.4 MG SL tablet Place 1 tablet (0.4 mg total) under the tongue every 5 (five) minutes as needed for chest pain.  . Omega-3 Fatty Acids (FISH OIL PO) Take 5 mLs by mouth daily.  . pravastatin (PRAVACHOL) 20 MG tablet Take 1 tablet (20 mg total) by mouth daily.  . sertraline (ZOLOFT) 50 MG tablet Take 50 mg by mouth daily as needed (for  anxiety.).    Current Facility-Administered Medications (Other)  Medication Route  . Bevacizumab (AVASTIN) SOLN 1.25 mg Intravitreal  . Bevacizumab (AVASTIN) SOLN 1.25 mg Intravitreal  . Bevacizumab (AVASTIN) SOLN 1.25 mg Intravitreal      REVIEW OF SYSTEMS: ROS    Positive for: Eyes   Negative for: Constitutional, Gastrointestinal, Neurological, Skin, Genitourinary, Musculoskeletal, HENT, Endocrine, Cardiovascular, Respiratory, Psychiatric, Allergic/Imm, Heme/Lymph   Last edited by Zenovia Jordan, LPN on 1/54/0086  7:61 PM. (History)       ALLERGIES Allergies  Allergen Reactions  . Lipitor [Atorvastatin]      Elevated LFT's    PAST MEDICAL HISTORY Past Medical History:  Diagnosis Date  . Anxiety   . Arthritis   . CAD (coronary artery disease)    a. 03/2017: 95% LCx stenosis --> PCI/DES placed  . Cataract   . Chest pain 03/27/2017  . Chronic diastolic heart failure ([place]) 06/25/2017  . Chronic lower back pain   . COPD (chronic obstructive pulmonary disease) ([place])   . Coronary artery disease   . Diverticulitis   . Diverticulosis   . GERD (gastroesophageal reflux disease)   . Glaucoma   . Heart failure, type unknown ([place]) 03/11/2017  . Hyperlipidemia 03/11/2017  . Hypothyroidism   . Pneumonia X 1   "years and years ago" (03/26/2017)  . Shortness of breath 03/11/2017  . Status post dilation of esophageal narrowing    Past Surgical History:  Procedure Laterality Date  . BILATERAL OOPHORECTOMY Bilateral   . CATARACT EXTRACTION    . CATARACT EXTRACTION W/ INTRAOCULAR LENS  IMPLANT, BILATERAL Bilateral   . CORONARY ANGIOPLASTY WITH STENT PLACEMENT  03/26/2017  . LEFT HEART CATH AND CORONARY ANGIOGRAPHY N/A 03/26/2017   Procedure: Left Heart Cath and Coronary Angiography;  Surgeon: Belva Crome, MD;  Location: [facility];  Service: Cardiovascular;  Laterality: N/A;  . NERVE SURGERY     "lump on my sciatic nerve was removed years ago"  . SHOULDER OPEN ROTATOR CUFF REPAIR Right   . THYROIDECTOMY, PARTIAL      FAMILY HISTORY Family History  Problem Relation Age of Onset  . CAD Mother   . Cataracts Mother   . Coronary artery disease Father   . Leukemia Sister   . Amblyopia Neg Hx   . Blindness Neg Hx   . Diabetes Neg Hx   . Glaucoma Neg Hx   . Macular degeneration Neg Hx   . Retinal detachment Neg Hx   . Strabismus Neg Hx   . Retinitis pigmentosa Neg Hx     SOCIAL HISTORY Social History   Tobacco Use  . Smoking status: Current Some Day Smoker    Packs/day: 0.50    Years: 60.00    Pack years: 30.00    Types: Cigarettes  . Smokeless tobacco: Never Used  Substance  Use Topics  . Alcohol use: Yes    Comment: twice a month  . Drug use: No         OPHTHALMIC EXAM:  Base Eye Exam    Visual Acuity (Snellen - Linear)      Right Left   Dist cc CF at 3' 20/60 -2   Dist ph cc  20/50   Correction:  Glasses       Tonometry (Tonopen, 1:22 PM)      Right Left   Pressure 18 16       Pupils      Dark Light Shape React APD   Right 4 3  Round Brisk None   Left 4 3 Round Brisk None       Visual Fields (Counting fingers)      Left Right    Full    Restrictions  Partial outer superior temporal, inferior temporal, superior nasal, inferior nasal deficiencies       Extraocular Movement      Right Left    Full Full       Neuro/Psych    Oriented x3:  Yes   Mood/Affect:  Normal       Dilation    Both eyes:  1.0% Mydriacyl, 2.5% Phenylephrine @ 1:22 PM        Slit Lamp and Fundus Exam    Slit Lamp Exam      Right Left   Lids/Lashes Dermatochalasis - upper lid, Telangiectasia Dermatochalasis - upper lid, Telangiectasia   Conjunctiva/Sclera White and quiet White and quiet   Cornea Arcus, 2+ diffuse Punctate epithelial erosions, diffuse endo pigment, decreased TBUT, Krukenberg's spindle Arcus, 2+ diffuse Punctate epithelial erosions, diffuse endo pigment, decreased TBUT, Krukenberg's spindle   Anterior Chamber Deep and quiet Deep and quiet   Iris Round and dilated Round and dilated   Lens Three piece PC IOL in good position, open PC Three piece PC IOL in good position, clear PC   Vitreous Vitreous syneresis, Posterior vitreous detachment Mild Vitreous syneresis, Posterior vitreous detachment       Fundus Exam      Right Left   Disc 360 Peripapillary atrophy 360 Peripapillary atrophy   C/D Ratio 0.2 0.1   Macula Drusen, RPE mottling and clumping, RPE atrophy nasal macula, Subretinal hemorrhage - turning white/fibrotic -- center still with some red heme, +PED, +CNVM Blunted foveal reflex, Drusen, RPE mottling and clumping, RPE Atrophy    Vessels Vascular attenuation Vascular attenuation   Periphery Attached Attached          IMAGING AND PROCEDURES  Imaging and Procedures for @TODAY @  OCT, Retina - OU - Both Eyes       Right Eye Quality was good. Central Foveal Thickness: 476. Progression has been stable. Findings include abnormal foveal contour, subretinal hyper-reflective material, pigment epithelial detachment, subretinal fluid, choroidal neovascular membrane, retinal drusen , epiretinal membrane, intraretinal fluid (Interval improvement in IRF/SRF).   Left Eye Quality was good. Central Foveal Thickness: 219. Progression has been stable. Findings include abnormal foveal contour, retinal drusen , no IRF, no SRF, outer retinal atrophy, pigment epithelial detachment.   Notes *Images captured and stored on drive  Diagnosis / Impression:  OD: Exudative AMD - interval improvement in IRF/SRF, but large residual SRHM/PED component OS: Non-exudative AMD, ? Progression of atrophy  Clinical management:  See below  Abbreviations: NFP - Normal foveal profile. CME - cystoid macular edema. PED - pigment epithelial detachment. IRF - intraretinal fluid. SRF - subretinal fluid. EZ - ellipsoid zone. ERM - epiretinal membrane. ORA - outer retinal atrophy. ORT - outer retinal tubulation. SRHM - subretinal hyper-reflective material         Intravitreal Injection, Pharmacologic Agent - OD - Right Eye       Time Out 05/14/2018. 1:53 PM. Confirmed correct patient, procedure, site, and patient consented.   Anesthesia Topical anesthesia was used. Anesthetic medications included Lidocaine 2%, Tetracaine 0.5%.   Procedure Preparation included 5% betadine to ocular surface, eyelid speculum. A 30 gauge needle was used.   Injection:  2 mg aflibercept 2 MG/0.05ML   NDC: 74259-563-87, Lot: [id number], Expiration date: 03/20/2019   Route: Intravitreal, Site:  Right Eye, Waste: 0.05 mL  Post-op Post injection exam found visual  acuity of at least counting fingers. The patient tolerated the procedure well. There were no complications. The patient received written and verbal post procedure care education.                 ASSESSMENT/PLAN:    ICD-10-CM   1. Exudative age-related macular degeneration of right eye with active choroidal neovascularization (HCC) H35.3211 OCT, Retina - OU - Both Eyes    Intravitreal Injection, Pharmacologic Agent - OD - Right Eye    aflibercept (EYLEA) SOLN 2 mg  2. Intermediate stage nonexudative age-related macular degeneration of left eye H35.3122   3. Retinal edema H35.81 OCT, Retina - OU - Both Eyes  4. Posterior vitreous detachment of both eyes H43.813   5. 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 Pseudophakia of both eyes Z96.1     1. 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 Kathlen Mody  - S/P IVA OD #1 (05.20.19), #2 (07.02.19), #3 (07.30.19)  - S/P IVE OD #1 (08.27.19)  - OCT with SRHM/PED with overlying SRF/heme -- interval improvement/resolution in IRF/SRF  - VA remains CF  - discussed findings and guarded prognosis  - switch in therapy to [facility] -- approved through [employer]  - recommend IVE OD #2 today (9.24.19)  - pt wishes to be treated with IVE OD  - RBA of procedure discussed, questions answered  - informed consent obtained and signed  - see procedure note  - Eylea paperwork and benefits investigation started on 07.30.19  - Approved through [employer] for Eylea  - f/u in 6 weeks  2. Age related macular degeneration, non-exudative, left eye  - possible progression of atrophy on OCT today  - The incidence, anatomy, and pathology of dry AMD, risk of progression, and the AREDS and AREDS 2 study including smoking risks discussed with patient.  - continue amsler grid monitoring  3. Retinal edema as above  4. PVD / vitreous syneresis OU  Discussed findings and  prognosis  No RT or RD on 360 peripheral exam  Reviewed s/s of RT/RD  Strict return precaution for any such RT/RD signs/symptoms   5. Pigmentary glaucoma OU-  - using latanoprost OU qhs - IOP good today - under the expert care of Dr. Quentin Ore - monitor  6. Pseudophakia OU  - s/p CE/IOL OU  - beautiful surgery, doing well  - monitor   Ophthalmic Meds Ordered this visit:  Meds ordered this encounter  Medications  . aflibercept (EYLEA) SOLN 2 mg       Return in about 6 weeks (around 06/25/2018) for F/U Exu AMD OD, DFE, OCT.  There are no Patient Instructions on file for this visit.   Explained the diagnoses, plan, and follow up with the patient and they expressed understanding.  Patient expressed understanding of the importance of proper follow up care.   This document serves as a record of services personally performed by Gardiner Sleeper, MD, PhD. It was created on their behalf by Ernest Mallick, OA, an ophthalmic assistant. The creation of this record is the provider's dictation and/or activities during the visit.    Electronically signed by: Ernest Mallick, OA  09.23.19 2:03 PM     Gardiner Sleeper, M.D., Ph.D. Diseases & Surgery of the Retina and [place]   I  have reviewed the above documentation for accuracy and completeness, and I agree with the above. Gardiner Sleeper, M.D., Ph.D. 05/14/18 2:03 PM     Abbreviations: M myopia (nearsighted); A astigmatism; H hyperopia (farsighted); P presbyopia; Mrx spectacle prescription;  CTL contact lenses; OD right eye; OS left eye; OU both eyes  XT exotropia; ET esotropia; PEK punctate epithelial keratitis; PEE punctate epithelial erosions; DES dry eye syndrome; MGD meibomian gland dysfunction; ATs artificial tears; PFAT's preservative free artificial tears; [place] nuclear sclerotic cataract; PSC posterior subcapsular cataract; ERM epi-retinal membrane; PVD posterior vitreous detachment; RD retinal  detachment; DM diabetes mellitus; DR diabetic retinopathy; NPDR non-proliferative diabetic retinopathy; PDR proliferative diabetic retinopathy; CSME clinically significant macular edema; DME diabetic macular edema; dbh dot blot hemorrhages; CWS cotton wool spot; POAG primary open angle glaucoma; C/D cup-to-disc ratio; HVF humphrey visual field; GVF goldmann visual field; OCT optical coherence tomography; IOP intraocular pressure; BRVO Branch retinal vein occlusion; CRVO central retinal vein occlusion; CRAO central retinal artery occlusion; BRAO branch retinal artery occlusion; RT retinal tear; SB scleral buckle; PPV pars plana vitrectomy; VH Vitreous hemorrhage; PRP panretinal laser photocoagulation; IVK intravitreal kenalog; VMT vitreomacular traction; MH Macular hole;  NVD neovascularization of the disc; NVE neovascularization elsewhere; AREDS age related eye disease study; ARMD age related macular degeneration; POAG primary open angle glaucoma; EBMD epithelial/anterior basement membrane dystrophy; ACIOL anterior chamber intraocular lens; IOL intraocular lens; PCIOL posterior chamber intraocular lens; Phaco/IOL phacoemulsification with intraocular lens placement; [place] photorefractive keratectomy; LASIK laser assisted in situ keratomileusis; HTN hypertension; DM diabetes mellitus; COPD chronic obstructive pulmonary disease

## 2018-05-14 ENCOUNTER — Ambulatory Visit (INDEPENDENT_AMBULATORY_CARE_PROVIDER_SITE_OTHER): Payer: Medicare Other | Admitting: Ophthalmology

## 2018-05-14 ENCOUNTER — Encounter (INDEPENDENT_AMBULATORY_CARE_PROVIDER_SITE_OTHER): Payer: Self-pay | Admitting: Ophthalmology

## 2018-05-14 DIAGNOSIS — H3581 Retinal edema: Secondary | ICD-10-CM

## 2018-05-14 DIAGNOSIS — H353122 Nonexudative age-related macular degeneration, left eye, intermediate dry stage: Secondary | ICD-10-CM

## 2018-05-14 DIAGNOSIS — H353211 Exudative age-related macular degeneration, right eye, with active choroidal neovascularization: Secondary | ICD-10-CM | POA: Diagnosis not present

## 2018-05-14 DIAGNOSIS — H401331 Pigmentary glaucoma, bilateral, mild stage: Secondary | ICD-10-CM

## 2018-05-14 DIAGNOSIS — H43813 Vitreous degeneration, bilateral: Secondary | ICD-10-CM

## 2018-05-14 DIAGNOSIS — Z961 Presence of intraocular lens: Secondary | ICD-10-CM

## 2018-05-14 MED ORDER — AFLIBERCEPT 2MG/0.05ML IZ SOLN FOR KALEIDOSCOPE
2.0000 mg | INTRAVITREAL | Status: DC
  Administered 2018-05-14: 2 mg via INTRAVITREAL

## 2018-05-17 DIAGNOSIS — E559 Vitamin D deficiency, unspecified: Secondary | ICD-10-CM | POA: Diagnosis not present

## 2018-05-17 DIAGNOSIS — F419 Anxiety disorder, unspecified: Secondary | ICD-10-CM | POA: Diagnosis not present

## 2018-05-17 DIAGNOSIS — R5382 Chronic fatigue, unspecified: Secondary | ICD-10-CM | POA: Diagnosis not present

## 2018-05-17 DIAGNOSIS — Z23 Encounter for immunization: Secondary | ICD-10-CM | POA: Diagnosis not present

## 2018-05-28 ENCOUNTER — Encounter (HOSPITAL_COMMUNITY): Payer: Self-pay

## 2018-05-28 ENCOUNTER — Emergency Department (HOSPITAL_COMMUNITY): Payer: Medicare Other

## 2018-05-28 ENCOUNTER — Emergency Department (HOSPITAL_COMMUNITY)
Admission: EM | Admit: 2018-05-28 | Discharge: 2018-05-28 | Disposition: A | Discharge status: Home / Self Care | Payer: Medicare Other | Attending: Emergency Medicine | Admitting: Emergency Medicine

## 2018-05-28 ENCOUNTER — Other Ambulatory Visit: Payer: Self-pay

## 2018-05-28 DIAGNOSIS — S6991XA Unspecified injury of right wrist, hand and finger(s), initial encounter: Secondary | ICD-10-CM | POA: Diagnosis not present

## 2018-05-28 DIAGNOSIS — Z7902 Long term (current) use of antithrombotics/antiplatelets: Secondary | ICD-10-CM | POA: Diagnosis not present

## 2018-05-28 DIAGNOSIS — I110 Hypertensive heart disease with heart failure: Secondary | ICD-10-CM | POA: Diagnosis not present

## 2018-05-28 DIAGNOSIS — Y929 Unspecified place or not applicable: Secondary | ICD-10-CM | POA: Insufficient documentation

## 2018-05-28 DIAGNOSIS — E039 Hypothyroidism, unspecified: Secondary | ICD-10-CM | POA: Diagnosis not present

## 2018-05-28 DIAGNOSIS — Y999 Unspecified external cause status: Secondary | ICD-10-CM | POA: Insufficient documentation

## 2018-05-28 DIAGNOSIS — S62111A Displaced fracture of triquetrum [cuneiform] bone, right wrist, initial encounter for closed fracture: Secondary | ICD-10-CM | POA: Diagnosis not present

## 2018-05-28 DIAGNOSIS — I5032 Chronic diastolic (congestive) heart failure: Secondary | ICD-10-CM | POA: Diagnosis not present

## 2018-05-28 DIAGNOSIS — Y939 Activity, unspecified: Secondary | ICD-10-CM | POA: Diagnosis not present

## 2018-05-28 DIAGNOSIS — I2510 Atherosclerotic heart disease of native coronary artery without angina pectoris: Secondary | ICD-10-CM | POA: Diagnosis not present

## 2018-05-28 DIAGNOSIS — S52122A Displaced fracture of head of left radius, initial encounter for closed fracture: Secondary | ICD-10-CM | POA: Diagnosis not present

## 2018-05-28 DIAGNOSIS — J449 Chronic obstructive pulmonary disease, unspecified: Secondary | ICD-10-CM | POA: Insufficient documentation

## 2018-05-28 DIAGNOSIS — S52502A Unspecified fracture of the lower end of left radius, initial encounter for closed fracture: Secondary | ICD-10-CM | POA: Diagnosis not present

## 2018-05-28 DIAGNOSIS — F17210 Nicotine dependence, cigarettes, uncomplicated: Secondary | ICD-10-CM | POA: Diagnosis not present

## 2018-05-28 DIAGNOSIS — Z7982 Long term (current) use of aspirin: Secondary | ICD-10-CM | POA: Diagnosis not present

## 2018-05-28 DIAGNOSIS — M25531 Pain in right wrist: Secondary | ICD-10-CM | POA: Diagnosis not present

## 2018-05-28 DIAGNOSIS — W1789XA Other fall from one level to another, initial encounter: Secondary | ICD-10-CM | POA: Insufficient documentation

## 2018-05-28 MED ORDER — ACETAMINOPHEN 325 MG PO TABS
650.0000 mg | ORAL_TABLET | Freq: Once | ORAL | Status: AC
  Administered 2018-05-28: 650 mg via ORAL
  Filled 2018-05-28: qty 2

## 2018-05-28 MED ORDER — FENTANYL CITRATE (PF) 100 MCG/2ML IJ SOLN
100.0000 ug | Freq: Once | INTRAMUSCULAR | Status: AC
  Administered 2018-05-28: 100 ug via INTRAMUSCULAR
  Filled 2018-05-28: qty 2

## 2018-05-28 MED ORDER — OXYCODONE-ACETAMINOPHEN 5-325 MG PO TABS: 1.0000 | ORAL_TABLET | Freq: Four times a day (QID) | ORAL | 0 refills | Status: DC | PRN

## 2018-05-28 NOTE — ED Provider Notes (Signed)
Medical screening examination/treatment/procedure(s) were conducted as a shared visit with non-physician practitioner(s) and myself.  I personally evaluated the patient during the encounter.  None 84 year old female who suffered a mechanical fall just prior to arrival onto both of her hands.  Has evidence of a left distal radius fracture as well as a right wrist fracture as well 2.  Will place in splint and refer to hand surgeon on-call   Lacretia Leigh, MD 05/28/18 2224

## 2018-05-28 NOTE — ED Provider Notes (Signed)
[facility] DEPT Provider Note   CSN: [id number] Arrival date & time: 05/28/18  2037     History   Chief Complaint Chief Complaint  Patient presents with  . Wrist Injury    HPI Marissa Caldwell is a 84 y.o. female possible history of anxiety, arthritis, COPD, GERD who presents for evaluation of bilateral wrist pain.  Patient states she was sitting outside on a chair and states that she went to go push herself up to stand up when her hand slipped off the handrails.  She states that she fell and hit both of her wrist with palms facing downward.  Patient reports that since then she has had bilateral pain, left greater than right.  She states she has difficulty moving the left secondary to pain.  Patient has not taken any medications prior to ED arrival.  Patient states she did not have any preceding chest pain, dizziness.  She did not hit her head.  Patient denies any numbness/weakness.  The history is provided by the patient.    Past Medical History:  Diagnosis Date  . Anxiety   . Arthritis   . CAD (coronary artery disease)    a. 03/2017: 95% LCx stenosis --> PCI/DES placed  . Cataract   . Chest pain 03/27/2017  . Chronic diastolic heart failure ([place]) 06/25/2017  . Chronic lower back pain   . COPD (chronic obstructive pulmonary disease) ([place])   . Coronary artery disease   . Diverticulitis   . Diverticulosis   . GERD (gastroesophageal reflux disease)   . Glaucoma   . Heart failure, type unknown ([place]) 03/11/2017  . Hyperlipidemia 03/11/2017  . Hypothyroidism   . Pneumonia X 1   "years and years ago" (03/26/2017)  . Shortness of breath 03/11/2017  . Status post dilation of esophageal narrowing     Patient Active Problem List   Diagnosis Date Noted  . Chronic diastolic heart failure ([place]) 06/25/2017  . COPD (chronic obstructive pulmonary disease) ([place]) 04/13/2017  . Chest pain 03/27/2017  . S/P angioplasty with stent 03/26/17 DES to LCX, EF 60%  03/27/2017  . CAD (coronary artery disease), native coronary artery 03/26/2017  . Abnormal nuclear stress test 03/25/2017  . Hyperlipidemia 03/11/2017  . Hypothyroidism 03/11/2017  . Shortness of breath 03/11/2017    Past Surgical History:  Procedure Laterality Date  . BILATERAL OOPHORECTOMY Bilateral   . CATARACT EXTRACTION    . CATARACT EXTRACTION W/ INTRAOCULAR LENS  IMPLANT, BILATERAL Bilateral   . CORONARY ANGIOPLASTY WITH STENT PLACEMENT  03/26/2017  . LEFT HEART CATH AND CORONARY ANGIOGRAPHY N/A 03/26/2017   Procedure: Left Heart Cath and Coronary Angiography;  Surgeon: Belva Crome, MD;  Location: [facility];  Service: Cardiovascular;  Laterality: N/A;  . NERVE SURGERY     "lump on my sciatic nerve was removed years ago"  . SHOULDER OPEN ROTATOR CUFF REPAIR Right   . THYROIDECTOMY, PARTIAL       OB History   None      Home Medications    Prior to Admission medications   Medication Sig Start Date End Date Taking? Authorizing Provider  albuterol (PROVENTIL HFA;VENTOLIN HFA) 108 (90 Base) MCG/ACT inhaler Inhale 2 puffs into the lungs every 6 (six) hours as needed (for shortness of breath/wheezing.).     [provider]  alendronate (FOSAMAX) 70 MG tablet Take 70 mg by mouth once a week. Take with a full glass of water on an empty stomach.  [provider]  aspirin 81 MG chewable tablet Chew 1 tablet (81 mg total) by mouth daily. 03/28/17   Isaiah Serge, NP  budesonide-formoterol [facility]) 160-4.5 MCG/ACT inhaler Inhale 2 puffs into the lungs 2 (two) times daily.    [provider]  Calcium Carb-Cholecalciferol (CALCIUM + D3) 600-200 MG-UNIT TABS Take 1 tablet by mouth daily.    [provider]  carvedilol (COREG) 3.125 MG tablet Take 1 tablet (3.125 mg total) by mouth 2 (two) times daily. 04/11/18 07/10/18  Skeet Latch, MD  Cholecalciferol (VITAMIN D3) 2000 units capsule Take 4,000 Units by mouth daily.    [provider]  Coenzyme Q10 (COQ10) 100 MG CAPS Take 100 mg by mouth daily.    [provider]  dorzolamide (TRUSOPT) 2 % ophthalmic solution Place 1 drop into both eyes 2 (two) times daily.    [provider]  latanoprost (XALATAN) 0.005 % ophthalmic solution Place 1 drop into both eyes at bedtime.    [provider]  levothyroxine (SYNTHROID, LEVOTHROID) 112 MCG tablet Take 112 mcg daily before breakfast by mouth.    [provider]  LORazepam (ATIVAN) 0.5 MG tablet Take 0.5 mg by mouth at bedtime.     [provider]  LUTEIN PO Take 1 tablet by mouth daily.    [provider]  Multiple Vitamin (MULTIVITAMIN) tablet Take 1 tablet by mouth daily.    [provider]  nitroGLYCERIN (NITROSTAT) 0.4 MG SL tablet Place 1 tablet (0.4 mg total) under the tongue every 5 (five) minutes as needed for chest pain. 03/27/17   Isaiah Serge, NP  Omega-3 Fatty Acids (FISH OIL PO) Take 5 mLs by mouth daily.    [provider]  oxyCODONE-acetaminophen (PERCOCET/ROXICET) 5-325 MG tablet Take 1-2 tablets by mouth every 6 (six) hours as needed for severe pain. 05/28/18   Volanda Napoleon, PA-C  pravastatin (PRAVACHOL) 20 MG tablet Take 1 tablet (20 mg total) by mouth daily. 04/18/18   Skeet Latch, MD  sertraline (ZOLOFT) 50 MG tablet Take 50 mg by mouth daily as needed (for anxiety.).     [provider]    Family History Family History  Problem Relation Age of Onset  . CAD Mother   . Cataracts Mother   . Coronary artery disease Father   . Leukemia Sister   . Amblyopia Neg Hx   . Blindness Neg Hx   . Diabetes Neg Hx   . Glaucoma Neg Hx   . Macular degeneration Neg Hx   . Retinal detachment Neg Hx   . Strabismus Neg Hx   . Retinitis pigmentosa Neg Hx     Social History Social History   Tobacco Use  . Smoking status: Current Some Day Smoker    Packs/day: 0.50    Years: 60.00    Pack years: 30.00    Types:  Cigarettes  . Smokeless tobacco: Never Used  Substance Use Topics  . Alcohol use: Yes    Comment: twice a month  . Drug use: No     Allergies   Lipitor [atorvastatin]   Review of Systems Review of Systems  Cardiovascular: Negative for chest pain.  Musculoskeletal:       Wrist pain  Neurological: Negative for dizziness, weakness and numbness.  All other systems reviewed and are negative.    Physical Exam Updated Vital Signs BP (!) 171/93 (BP Location: Right Arm)   Pulse 80   Temp 98.8 F (37.1 C) (Oral)  Resp 16   SpO2 96%   Physical Exam  Constitutional: She is oriented to person, place, and time. She appears well-developed and well-nourished.  HENT:  Head: Normocephalic and atraumatic.  Eyes: Conjunctivae and EOM are normal. Right eye exhibits no discharge. Left eye exhibits no discharge. No scleral icterus.  Cardiovascular:  Pulses:      Radial pulses are 2+ on the right side, and 2+ on the left side.  Pulmonary/Chest: Effort normal.  Musculoskeletal:  Tenderness palpation noted to dorsal aspect of left wrist with soft tissue swelling and deformity.  Patient can move all 5 fingers of left hand.  Limited range of motion of wrist secondary to pain.  No tenderness palpation noted of proximal forearm, elbow, humerus.  Tenderness palpation noted to the ulnar aspect of the right wrist.  Flexion/extension intact but with some subjective reports of pain.  Patient can move all 5 digits of right hand without any difficulty.  Neurological: She is alert and oriented to person, place, and time.  Sensation intact along major nerve distributions of BUE  Skin: Skin is warm and dry. Capillary refill takes less than 2 seconds.  Good distal cap refill. LUE is not dusky in appearance or cool to touch.  Psychiatric: She has a normal mood and affect. Her speech is normal and behavior is normal.  Nursing note and vitals reviewed.    ED Treatments / Results  Labs (all labs ordered  are listed, but only abnormal results are displayed) Labs Reviewed - No data to display  EKG None  Radiology Dg Wrist Complete Left  Result Date: 05/28/2018 CLINICAL DATA:  Left wrist pain after fall today. EXAM: LEFT WRIST - COMPLETE 3+ VIEW COMPARISON:  None. FINDINGS: Moderately displaced fracture is seen involving the distal left radius. No other fracture is noted. Intercarpal degenerative joint disease is noted. Degenerative change involving first carpometacarpal joint is noted. IMPRESSION: Moderately displaced distal left radial fracture. Electronically Signed   By: Marijo Conception, M.D.   On: 05/28/2018 21:29   Dg Wrist Complete Right  Result Date: 05/28/2018 CLINICAL DATA:  Right wrist pain after fall today. EXAM: RIGHT WRIST - COMPLETE 3+ VIEW COMPARISON:  None. FINDINGS: Mildly displaced triquetrum fracture is noted on lateral projection. Degenerative changes seen involving the intercarpal space between scaphoid bone, trapezium and trapezoid. Moderate degenerative changes seen involving the first carpometacarpal joint. Soft tissues are unremarkable. IMPRESSION: Degenerative joint disease as described above. Probable mildly displaced triquetrum fracture. Electronically Signed   By: Marijo Conception, M.D.   On: 05/28/2018 21:33    Procedures Procedures (including critical care time)  Medications Ordered in ED Medications  acetaminophen (TYLENOL) tablet 650 mg (650 mg Oral Given 05/28/18 2131)  fentaNYL (SUBLIMAZE) injection 100 mcg (100 mcg Intramuscular Given 05/28/18 2216)     Initial Impression / Assessment and Plan / ED Course  I have reviewed the triage vital signs and the nursing notes.  Pertinent labs & imaging results that were available during my care of the patient were reviewed by me and considered in my medical decision making (see chart for details).     84 y.o. F presents for evaluation of bilateral wrist pain, left greater than right after mechanical fall.  She  reports she was pushing herself up from bed and reports that her hand slipped causing pain. Patient is afebrile, non-toxic appearing, sitting comfortably on examination table. Vital signs reviewed and stable. Patient is neurovascularly intact.  On exam, patient with tenderness palpation noted  to the the dorsal aspect of the left wrist with overlying soft tissue swelling deformity.  Tenderness palpation of the ulnar aspect of right wrist.  Consider fracture versus dislocation versus sprain.  X-rays ordered at triage.  Right wrist x-ray shows probable mildly displaced triquetrum fracture.  Otherwise degenerative changes noted.  Left wrist shows a moderately displaced distal left radial fracture.   Discussed patient with Dr. Zenia Resides who independently evaluated patient.  We will plan to put in splint and follow-up with referred hand doctor.  Reevaluation after splint placement.  Patient with good distal sensation and cap refill.  Instructed patient on splint care.  We will plan to give her follow-up with outpatient hand surgeon in.  Instructed her to follow-up with her.  Vital signs stable.  She is still slightly hypertensive.  This is likely secondary to pain.  Patient also has not had any of her evening medications.  Patient will be accompanied home by her granddaughter.  Patient stable for discharge at this time. Patient had ample opportunity for questions and discussion. All patient's questions were answered with full understanding. Strict return precautions discussed. Patient expresses understanding and agreement to plan.   Final Clinical Impressions(s) / ED Diagnoses   Final diagnoses:  Closed fracture of distal end of left radius, unspecified fracture morphology, initial encounter  Closed displaced fracture of triquetrum of right wrist, initial encounter    ED Discharge Orders         Ordered    oxyCODONE-acetaminophen (PERCOCET/ROXICET) 5-325 MG tablet  Every 6 hours PRN     05/28/18 2310             Volanda Napoleon, PA-C 05/29/18 0032    Lacretia Leigh, MD 05/29/18 1348

## 2018-05-28 NOTE — ED Triage Notes (Signed)
Pt was pushing herself up from her chair and her hand slipped off the handrails, she complains of bilateral wrist pain but the left one is worse with some deformity

## 2018-05-28 NOTE — Discharge Instructions (Signed)
You can take 1000 mg of Tylenol.  Do not exceed 4000 mg of Tylenol a day.  Take pain medication for severe breakthrough pain.  This may pain medication can make you drowsy so please do not drive while taking it.  As we discussed, make sure you are elevating her arm to help with swelling.  Follow-up with referred hand doctor for further evaluation.  Return the emergency department for any worsening pain, numbness/weakness of the fingers, discoloration of the fingers or any other worsening or concerning symptoms.

## 2018-05-30 ENCOUNTER — Ambulatory Visit (INDEPENDENT_AMBULATORY_CARE_PROVIDER_SITE_OTHER): Payer: Medicare Other | Admitting: Family Medicine

## 2018-05-30 ENCOUNTER — Encounter (INDEPENDENT_AMBULATORY_CARE_PROVIDER_SITE_OTHER): Payer: Self-pay | Admitting: Family Medicine

## 2018-05-30 DIAGNOSIS — S62111A Displaced fracture of triquetrum [cuneiform] bone, right wrist, initial encounter for closed fracture: Secondary | ICD-10-CM | POA: Diagnosis not present

## 2018-05-30 DIAGNOSIS — S52552A Other extraarticular fracture of lower end of left radius, initial encounter for closed fracture: Secondary | ICD-10-CM | POA: Diagnosis not present

## 2018-05-30 NOTE — Progress Notes (Signed)
Office Visit Note   Patient: Marissa Caldwell           Date of Birth: 13-Oct-1931           MRN: [id number] Visit Date: 05/30/2018 Requested by: [name], MD [facility] [place], [place] [id number] PCP: [name], MD  Subjective: Chief Complaint  Patient presents with  . bilateral wrist pain, post fall 03/28/18    HPI: She is here with bilateral wrist pain.  84 year old who lost her balance 2 days ago and fell forward catching herself with both hands.  Immediate pain, especially on the left.  She went to the hospital where x-rays revealed a right wrist triquetrum fracture and a left distal radius fracture.  She was placed in a removable splint on the right and sugar tong splint on the left.  Right wrist is feeling much better.  She is right-hand dominant.  No previous wrist injuries but she has had several fractures in the past.              ROS: She has COPD, hypothyroidism, coronary artery disease.  She has osteoporosis as well.  Objective: Vital Signs: There were no vitals taken for this visit.  Physical Exam:  Right wrist: Bruising on the dorsum of her wrist and hand.  Slight tenderness dorsally near the triquetrum.  No tenderness at the distal radius or ulna. Left wrist: Mild to moderate swelling and bruising.  Neurovascularly intact.  Very tender at the distal radius.  Imaging: X-rays from the hospital reviewed on computer, no new x-rays obtained today.  Slight dorsal tilt of the distal radius on the left, but acceptable alignment.  Dr. Erlinda Hong reviewed the films as well.  Assessment & Plan: 1.  Right wrist triquetral avulsion fracture -Clinically improving.  Removable splint, recheck in 2 to 3 weeks.  If still having a lot of pain we might take another x-ray.  Otherwise we will go by clinical exam.  2.  Extra-articular left distal radius fracture with acceptable dorsal angulation -Short arm cast, return in 10 to 14 days for two-view left wrist x-rays.   Anticipate 6 to 12 weeks healing time.   Follow-Up Instructions: No follow-ups on file.       Procedures: None today.   PMFS History: Patient Active Problem List   Diagnosis Date Noted  . Chronic diastolic heart failure ([place]) 06/25/2017  . COPD (chronic obstructive pulmonary disease) ([place]) 04/13/2017  . Chest pain 03/27/2017  . S/P angioplasty with stent 03/26/17 DES to LCX, EF 60% 03/27/2017  . CAD (coronary artery disease), native coronary artery 03/26/2017  . Abnormal nuclear stress test 03/25/2017  . Hyperlipidemia 03/11/2017  . Hypothyroidism 03/11/2017  . Shortness of breath 03/11/2017   Past Medical History:  Diagnosis Date  . Anxiety   . Arthritis   . CAD (coronary artery disease)    a. 03/2017: 95% LCx stenosis --> PCI/DES placed  . Cataract   . Chest pain 03/27/2017  . Chronic diastolic heart failure ([place]) 06/25/2017  . Chronic lower back pain   . COPD (chronic obstructive pulmonary disease) ([place])   . Coronary artery disease   . Diverticulitis   . Diverticulosis   . GERD (gastroesophageal reflux disease)   . Glaucoma   . Heart failure, type unknown ([place]) 03/11/2017  . Hyperlipidemia 03/11/2017  . Hypothyroidism   . Pneumonia X 1   "years and years ago" (03/26/2017)  . Shortness of breath 03/11/2017  . Status post dilation  of esophageal narrowing     Family History  Problem Relation Age of Onset  . CAD Mother   . Cataracts Mother   . Coronary artery disease Father   . Leukemia Sister   . Amblyopia Neg Hx   . Blindness Neg Hx   . Diabetes Neg Hx   . Glaucoma Neg Hx   . Macular degeneration Neg Hx   . Retinal detachment Neg Hx   . Strabismus Neg Hx   . Retinitis pigmentosa Neg Hx     Past Surgical History:  Procedure Laterality Date  . BILATERAL OOPHORECTOMY Bilateral   . CATARACT EXTRACTION    . CATARACT EXTRACTION W/ INTRAOCULAR LENS  IMPLANT, BILATERAL Bilateral   . CORONARY ANGIOPLASTY WITH STENT PLACEMENT  03/26/2017  . LEFT HEART CATH AND  CORONARY ANGIOGRAPHY N/A 03/26/2017   Procedure: Left Heart Cath and Coronary Angiography;  Surgeon: Belva Crome, MD;  Location: [facility];  Service: Cardiovascular;  Laterality: N/A;  . NERVE SURGERY     "lump on my sciatic nerve was removed years ago"  . SHOULDER OPEN ROTATOR CUFF REPAIR Right   . THYROIDECTOMY, PARTIAL     Social History   Occupational History  . Occupation: retired  Tobacco Use  . Smoking status: Current Some Day Smoker    Packs/day: 0.50    Years: 60.00    Pack years: 30.00    Types: Cigarettes  . Smokeless tobacco: Never Used  Substance and Sexual Activity  . Alcohol use: Yes    Comment: twice a month  . Drug use: No  . Sexual activity: Never

## 2018-06-01 DIAGNOSIS — I509 Heart failure, unspecified: Secondary | ICD-10-CM | POA: Diagnosis not present

## 2018-06-01 DIAGNOSIS — M810 Age-related osteoporosis without current pathological fracture: Secondary | ICD-10-CM | POA: Diagnosis not present

## 2018-06-01 DIAGNOSIS — I110 Hypertensive heart disease with heart failure: Secondary | ICD-10-CM | POA: Diagnosis not present

## 2018-06-01 DIAGNOSIS — S52502D Unspecified fracture of the lower end of left radius, subsequent encounter for closed fracture with routine healing: Secondary | ICD-10-CM | POA: Diagnosis not present

## 2018-06-01 DIAGNOSIS — H9193 Unspecified hearing loss, bilateral: Secondary | ICD-10-CM | POA: Diagnosis not present

## 2018-06-01 DIAGNOSIS — H409 Unspecified glaucoma: Secondary | ICD-10-CM | POA: Diagnosis not present

## 2018-06-01 DIAGNOSIS — I2510 Atherosclerotic heart disease of native coronary artery without angina pectoris: Secondary | ICD-10-CM | POA: Diagnosis not present

## 2018-06-01 DIAGNOSIS — J449 Chronic obstructive pulmonary disease, unspecified: Secondary | ICD-10-CM | POA: Diagnosis not present

## 2018-06-01 DIAGNOSIS — Z9181 History of falling: Secondary | ICD-10-CM | POA: Diagnosis not present

## 2018-06-01 DIAGNOSIS — H3530 Unspecified macular degeneration: Secondary | ICD-10-CM | POA: Diagnosis not present

## 2018-06-01 DIAGNOSIS — N3281 Overactive bladder: Secondary | ICD-10-CM | POA: Diagnosis not present

## 2018-06-01 DIAGNOSIS — F419 Anxiety disorder, unspecified: Secondary | ICD-10-CM | POA: Diagnosis not present

## 2018-06-01 DIAGNOSIS — E440 Moderate protein-calorie malnutrition: Secondary | ICD-10-CM | POA: Diagnosis not present

## 2018-06-03 DIAGNOSIS — M810 Age-related osteoporosis without current pathological fracture: Secondary | ICD-10-CM | POA: Diagnosis not present

## 2018-06-03 DIAGNOSIS — S52502A Unspecified fracture of the lower end of left radius, initial encounter for closed fracture: Secondary | ICD-10-CM | POA: Diagnosis not present

## 2018-06-06 ENCOUNTER — Other Ambulatory Visit: Payer: Self-pay | Admitting: Cardiovascular Disease

## 2018-06-06 MED ORDER — CARVEDILOL 3.125 MG PO TABS: 3.1250 mg | ORAL_TABLET | Freq: Two times a day (BID) | ORAL | 1 refills | Status: DC

## 2018-06-06 MED ORDER — PRAVASTATIN SODIUM 20 MG PO TABS: 20.0000 mg | ORAL_TABLET | Freq: Every day | ORAL | 1 refills | Status: DC

## 2018-06-06 NOTE — Telephone Encounter (Signed)
°*  STAT* If patient is at the pharmacy, call can be transferred to refill team.   1. Which medications need to be refilled? (please list name of each medication and dose if known)  Need new prescriptions-changing pharmacy-Carvedilol and Pravastatin  2. Which pharmacy/location (including street and city if local pharmacy) is medication to be sent to? [facility]  3. Do they need a 30 day or 90 day supply? 90 and refills

## 2018-06-07 ENCOUNTER — Telehealth (INDEPENDENT_AMBULATORY_CARE_PROVIDER_SITE_OTHER): Payer: Self-pay | Admitting: Family Medicine

## 2018-06-07 NOTE — Telephone Encounter (Signed)
Ok, thanks.

## 2018-06-07 NOTE — Telephone Encounter (Signed)
[name]  [phone]    Left Hand [facility]    [name] called from [facility] wanted to inform Dr.Hilts about Marissa Caldwell. As of last night patient pain was 10 out of 10 and today patients pain level is between 0 and 6 with pain med. Patient is being treated for level 3 interaction between Lorazepam(Not taking) and oxycodone.

## 2018-06-11 ENCOUNTER — Ambulatory Visit: Payer: Medicare Other | Admitting: Cardiovascular Disease

## 2018-06-12 ENCOUNTER — Encounter (INDEPENDENT_AMBULATORY_CARE_PROVIDER_SITE_OTHER): Payer: Self-pay | Admitting: Family Medicine

## 2018-06-12 ENCOUNTER — Ambulatory Visit (INDEPENDENT_AMBULATORY_CARE_PROVIDER_SITE_OTHER): Payer: Medicare Other

## 2018-06-12 ENCOUNTER — Ambulatory Visit (INDEPENDENT_AMBULATORY_CARE_PROVIDER_SITE_OTHER): Payer: Medicare Other | Admitting: Family Medicine

## 2018-06-12 DIAGNOSIS — S52552D Other extraarticular fracture of lower end of left radius, subsequent encounter for closed fracture with routine healing: Secondary | ICD-10-CM | POA: Diagnosis not present

## 2018-06-12 DIAGNOSIS — S62111D Displaced fracture of triquetrum [cuneiform] bone, right wrist, subsequent encounter for fracture with routine healing: Secondary | ICD-10-CM

## 2018-06-12 NOTE — Progress Notes (Signed)
Office Visit Note   Patient: Marissa Caldwell           Date of Birth: [birth year]           MRN: [id number] Visit Date: 06/12/2018 Requested by: [name], MD [address] [place], [place] [address] PCP: [name], MD  Subjective: Chief Complaint  Patient presents with  . Right Wrist - Follow-up    In removable wrist splint  . Left Wrist - Pain    In [facility]    HPI: She is about 2 weeks status post fall resulting in left wrist distal radius extra-articular fracture with dorsal angulation, and right wrist triquetral avulsion fracture.  Pain is steadily improving in her cast for the left wrist and removable splint for the right.              ROS: Noncontributory  Objective: Vital Signs: There were no vitals taken for this visit.  Physical Exam:  Left wrist: Cast is intact, no skin breakdown.  No tenderness to palpation of her fingers. Right wrist: There is palpable crepitus near the TFC and some tenderness on the dorsal ulnar side of the wrist.  Good range of motion with no swelling.  Imaging: X-rays left wrist: These were taken through the cast and show stable fracture alignment with unchanged dorsal angulation.  May have some early callus formation present.  Assessment & Plan: 1.  Stable 2 weeks status post fall resulting in left wrist distal radius extra-articular fracture with acceptable dorsal angulation, and right wrist triquetral avulsion fracture. -Return in 2 weeks for left wrist cast removal and 2 view x-rays.  If right wrist is still tender/painful, we will x-ray it as well.   Follow-Up Instructions: Return in about 2 weeks (around 06/26/2018).       Procedures: None today.   PMFS History: Patient Active Problem List   Diagnosis Date Noted  . Chronic diastolic heart failure ([place]) 06/25/2017  . COPD (chronic obstructive pulmonary disease) ([place]) 04/13/2017  . Chest pain 03/27/2017  . S/P angioplasty with stent 03/26/17 DES to LCX, EF 60%  03/27/2017  . CAD (coronary artery disease), native coronary artery 03/26/2017  . Abnormal nuclear stress test 03/25/2017  . Hyperlipidemia 03/11/2017  . Hypothyroidism 03/11/2017  . Shortness of breath 03/11/2017   Past Medical History:  Diagnosis Date  . Anxiety   . Arthritis   . CAD (coronary artery disease)    a. 03/2017: 95% LCx stenosis --> PCI/DES placed  . Cataract   . Chest pain 03/27/2017  . Chronic diastolic heart failure ([place]) 06/25/2017  . Chronic lower back pain   . COPD (chronic obstructive pulmonary disease) ([place])   . Coronary artery disease   . Diverticulitis   . Diverticulosis   . GERD (gastroesophageal reflux disease)   . Glaucoma   . Heart failure, type unknown ([place]) 03/11/2017  . Hyperlipidemia 03/11/2017  . Hypothyroidism   . Pneumonia X 1   "years and years ago" (03/26/2017)  . Shortness of breath 03/11/2017  . Status post dilation of esophageal narrowing     Family History  Problem Relation Age of Onset  . CAD Mother   . Cataracts Mother   . Coronary artery disease Father   . Leukemia Sister   . Amblyopia Neg Hx   . Blindness Neg Hx   . Diabetes Neg Hx   . Glaucoma Neg Hx   . Macular degeneration Neg Hx   . Retinal detachment Neg Hx   .  Strabismus Neg Hx   . Retinitis pigmentosa Neg Hx     Past Surgical History:  Procedure Laterality Date  . BILATERAL OOPHORECTOMY Bilateral   . CATARACT EXTRACTION    . CATARACT EXTRACTION W/ INTRAOCULAR LENS  IMPLANT, BILATERAL Bilateral   . CORONARY ANGIOPLASTY WITH STENT PLACEMENT  03/26/2017  . LEFT HEART CATH AND CORONARY ANGIOGRAPHY N/A 03/26/2017   Procedure: Left Heart Cath and Coronary Angiography;  Surgeon: Belva Crome, MD;  Location: [facility];  Service: Cardiovascular;  Laterality: N/A;  . NERVE SURGERY     "lump on my sciatic nerve was removed years ago"  . SHOULDER OPEN ROTATOR CUFF REPAIR Right   . THYROIDECTOMY, PARTIAL     Social History   Occupational History  . Occupation:  retired  Tobacco Use  . Smoking status: Current Some Day Smoker    Packs/day: 0.50    Years: 60.00    Pack years: 30.00    Types: Cigarettes  . Smokeless tobacco: Never Used  Substance and Sexual Activity  . Alcohol use: Yes    Comment: twice a month  . Drug use: No  . Sexual activity: Never

## 2018-06-24 DIAGNOSIS — F419 Anxiety disorder, unspecified: Secondary | ICD-10-CM | POA: Diagnosis not present

## 2018-06-24 DIAGNOSIS — E441 Mild protein-calorie malnutrition: Secondary | ICD-10-CM | POA: Diagnosis not present

## 2018-06-24 DIAGNOSIS — M810 Age-related osteoporosis without current pathological fracture: Secondary | ICD-10-CM | POA: Diagnosis not present

## 2018-06-24 DIAGNOSIS — E039 Hypothyroidism, unspecified: Secondary | ICD-10-CM | POA: Diagnosis not present

## 2018-06-24 NOTE — Progress Notes (Signed)
[facility] Note  06/25/2018     CHIEF COMPLAINT Patient presents for Retina Follow Up   HISTORY OF PRESENT ILLNESS: Marissa Caldwell is a 84 y.o. female who presents to the clinic today for:   HPI    Retina Follow Up    Patient presents with  Wet AMD.  In both eyes.  Duration of 6 weeks.  Since onset it is stable.  I, the attending physician,  performed the HPI with the patient and updated documentation appropriately.          Comments    Ret eval follow up 6 weeks, Exu Armd ou. Patient states vision in the right eye is some better in the peripheral       Last edited by Bernarda Caffey, MD on 06/25/2018  2:01 PM. (History)    pt states she can see things farther away better than she can close up, but she has noticed her vision has gotten a little better, she states she still has a blotch in the middle,  Referring physician: [name], MD [place], [place] [address]  HISTORICAL INFORMATION:   Selected notes from the MEDICAL RECORD NUMBER Referred by Dr. Quentin Ore for concern of exudative ARMD OD LEE: 05.16.19 (C.We[facility]) [BCVA: OD: 20/60 OS: 20/70] Ocular Hx-Psedudophakia OU, Glaucoma suspect, non-exudative ARMD OS, Yag PMH-HTN, Asthma    CURRENT MEDICATIONS: Current Outpatient Medications (Ophthalmic Drugs)  Medication Sig  . dorzolamide (TRUSOPT) 2 % ophthalmic solution Place 1 drop into both eyes 2 (two) times daily.  Marland Kitchen latanoprost (XALATAN) 0.005 % ophthalmic solution Place 1 drop into both eyes at bedtime.   Current Facility-Administered Medications (Ophthalmic Drugs)  Medication Route  . aflibercept (EYLEA) SOLN 2 mg Intravitreal  . aflibercept (EYLEA) SOLN 2 mg Intravitreal  . aflibercept (EYLEA) SOLN 2 mg Intravitreal   Current Outpatient Medications (Other)  Medication Sig  . albuterol (PROVENTIL HFA;VENTOLIN HFA) 108 (90 Base) MCG/ACT inhaler Inhale 2 puffs into the lungs every 6 (six) hours as  needed (for shortness of breath/wheezing.).   Marland Kitchen alendronate (FOSAMAX) 70 MG tablet Take 70 mg by mouth once a week. Take with a full glass of water on an empty stomach.  Marland Kitchen aspirin 81 MG chewable tablet Chew 1 tablet (81 mg total) by mouth daily.  . budesonide-formoterol (SYMBICORT) 160-4.5 MCG/ACT inhaler Inhale 2 puffs into the lungs 2 (two) times daily.  . Calcium Carb-Cholecalciferol (CALCIUM + D3) 600-200 MG-UNIT TABS Take 1 tablet by mouth daily.  . carvedilol (COREG) 3.125 MG tablet Take 1 tablet (3.125 mg total) by mouth 2 (two) times daily.  . Cholecalciferol (VITAMIN D3) 2000 units capsule Take 4,000 Units by mouth daily.  . Coenzyme Q10 (COQ10) 100 MG CAPS Take 100 mg by mouth daily.  Marland Kitchen levothyroxine (SYNTHROID, LEVOTHROID) 100 MCG tablet TAKE 1 TABLET BY MOUTH ONCE DAILY ON AN EMPTY STOMACH IN THE MORNING FOR 30 DAYS  . levothyroxine (SYNTHROID, LEVOTHROID) 112 MCG tablet Take 112 mcg daily before breakfast by mouth.  Marland Kitchen LORazepam (ATIVAN) 0.5 MG tablet Take 0.5 mg by mouth at bedtime.   . LUTEIN PO Take 1 tablet by mouth daily.  . mirtazapine (REMERON) 15 MG tablet TAKE 1 TABLET BY MOUTH AT BEDTIME ONCE A DAY  . Multiple Vitamin (MULTIVITAMIN) tablet Take 1 tablet by mouth daily.  . Omega-3 Fatty Acids (FISH OIL PO) Take 5 mLs by mouth daily.  Marland Kitchen oxyCODONE-acetaminophen (PERCOCET/ROXICET) 5-325 MG tablet Take 1-2  tablets by mouth every 6 (six) hours as needed for severe pain.  . pravastatin (PRAVACHOL) 20 MG tablet Take 1 tablet (20 mg total) by mouth daily.  . sertraline (ZOLOFT) 50 MG tablet Take 50 mg by mouth daily as needed (for anxiety.).   Marland Kitchen TOVIAZ 4 MG TB24 tablet TK 1 T PO  ONCE A DAY  . nitroGLYCERIN (NITROSTAT) 0.4 MG SL tablet Place 1 tablet (0.4 mg total) under the tongue every 5 (five) minutes as needed for chest pain.   Current Facility-Administered Medications (Other)  Medication Route  . Bevacizumab (AVASTIN) SOLN 1.25 mg Intravitreal  . Bevacizumab (AVASTIN) SOLN  1.25 mg Intravitreal  . Bevacizumab (AVASTIN) SOLN 1.25 mg Intravitreal      REVIEW OF SYSTEMS: ROS    Positive for: Eyes   Negative for: Constitutional, Gastrointestinal, Neurological, Skin, Genitourinary, Musculoskeletal, HENT, Endocrine, Cardiovascular, Respiratory, Psychiatric, Allergic/Imm, Heme/Lymph   Last edited by Elmore Guise on 06/25/2018  1:30 PM. (History)       ALLERGIES Allergies  Allergen Reactions  . Lipitor [Atorvastatin]     Elevated LFT's    PAST MEDICAL HISTORY Past Medical History:  Diagnosis Date  . Anxiety   . Arthritis   . CAD (coronary artery disease)    a. 03/2017: 95% LCx stenosis --> PCI/DES placed  . Cataract   . Chest pain 03/27/2017  . Chronic diastolic heart failure ([place]) 06/25/2017  . Chronic lower back pain   . COPD (chronic obstructive pulmonary disease) ([place])   . Coronary artery disease   . Diverticulitis   . Diverticulosis   . GERD (gastroesophageal reflux disease)   . Glaucoma   . Heart failure, type unknown ([place]) 03/11/2017  . Hyperlipidemia 03/11/2017  . Hypothyroidism   . Pneumonia X 1   "years and years ago" (03/26/2017)  . Shortness of breath 03/11/2017  . Status post dilation of esophageal narrowing    Past Surgical History:  Procedure Laterality Date  . BILATERAL OOPHORECTOMY Bilateral   . CATARACT EXTRACTION    . CATARACT EXTRACTION W/ INTRAOCULAR LENS  IMPLANT, BILATERAL Bilateral   . CORONARY ANGIOPLASTY WITH STENT PLACEMENT  03/26/2017  . LEFT HEART CATH AND CORONARY ANGIOGRAPHY N/A 03/26/2017   Procedure: Left Heart Cath and Coronary Angiography;  Surgeon: Belva Crome, MD;  Location: [facility];  Service: Cardiovascular;  Laterality: N/A;  . NERVE SURGERY     "lump on my sciatic nerve was removed years ago"  . SHOULDER OPEN ROTATOR CUFF REPAIR Right   . THYROIDECTOMY, PARTIAL      FAMILY HISTORY Family History  Problem Relation Age of Onset  . CAD Mother   . Cataracts Mother   . Coronary artery  disease Father   . Leukemia Sister   . Amblyopia Neg Hx   . Blindness Neg Hx   . Diabetes Neg Hx   . Glaucoma Neg Hx   . Macular degeneration Neg Hx   . Retinal detachment Neg Hx   . Strabismus Neg Hx   . Retinitis pigmentosa Neg Hx     SOCIAL HISTORY Social History   Tobacco Use  . Smoking status: Current Some Day Smoker    Packs/day: 0.50    Years: 60.00    Pack years: 30.00    Types: Cigarettes  . Smokeless tobacco: Never Used  Substance Use Topics  . Alcohol use: Yes    Comment: twice a month  . Drug use: No         OPHTHALMIC  EXAM:  Base Eye Exam    Visual Acuity (Snellen - Linear)      Right Left   Dist cc 20/CF 20/50   Dist ph cc 20/NI 20/NI       Tonometry (Tonopen, 1:20 PM)      Right Left   Pressure 10 12       Pupils      Dark Light Shape React APD   Right 3 2 Round Minimal None   Left 3 2 Round Minimal None       Visual Fields (Counting fingers)      Left Right    Full Full       Extraocular Movement      Right Left    Full Full       Neuro/Psych    Oriented x3:  Yes   Mood/Affect:  Normal       Dilation    Both eyes:  1.0% Mydriacyl, 2.5% Phenylephrine @ 1:31 PM        Slit Lamp and Fundus Exam    Slit Lamp Exam      Right Left   Lids/Lashes Dermatochalasis - upper lid, Telangiectasia Dermatochalasis - upper lid, Telangiectasia   Conjunctiva/Sclera White and quiet White and quiet   Cornea Arcus, 2+ diffuse Punctate epithelial erosions, diffuse endo pigment, decreased TBUT, Krukenberg's spindle Arcus, 2+ diffuse Punctate epithelial erosions, diffuse endo pigment, decreased TBUT, Krukenberg's spindle   Anterior Chamber Deep and quiet Deep and quiet   Iris Round and dilated Round and dilated   Lens Three piece PC IOL in good position, open PC Three piece PC IOL in good position, clear PC   Vitreous Vitreous syneresis, Posterior vitreous detachment Mild Vitreous syneresis, Posterior vitreous detachment       Fundus Exam       Right Left   Disc 360 Peripapillary atrophy 360 Peripapillary atrophy   C/D Ratio 0.2 0.1   Macula Drusen, RPE mottling and clumping, RPE atrophy nasal macula, Subretinal hemorrhage - now white/fibrotic -- center with punctate red heme, +PED, +CNVM Blunted foveal reflex, Drusen, RPE mottling and clumping, RPE Atrophy   Vessels Vascular attenuation Vascular attenuation   Periphery Attached Attached          IMAGING AND PROCEDURES  Imaging and Procedures for @TODAY @  OCT, Retina - OU - Both Eyes       Right Eye Quality was good. Central Foveal Thickness: 476. Progression has been stable. Findings include abnormal foveal contour, subretinal hyper-reflective material, pigment epithelial detachment, subretinal fluid, choroidal neovascular membrane, retinal drusen , epiretinal membrane, normal foveal contour, no IRF (Interval improvement in IRF/SRF).   Left Eye Quality was good. Central Foveal Thickness: 237. Progression has been stable. Findings include abnormal foveal contour, retinal drusen , no IRF, no SRF, outer retinal atrophy, pigment epithelial detachment.   Notes *Images captured and stored on drive  Diagnosis / Impression:  OD: Exudative AMD - interval improvement in IRF/SRF, but large residual SRHM/PED component OS: Non-exudative AMD, ? Progression of atrophy  Clinical management:  See below  Abbreviations: NFP - Normal foveal profile. CME - cystoid macular edema. PED - pigment epithelial detachment. IRF - intraretinal fluid. SRF - subretinal fluid. EZ - ellipsoid zone. ERM - epiretinal membrane. ORA - outer retinal atrophy. ORT - outer retinal tubulation. SRHM - subretinal hyper-reflective material         Intravitreal Injection, Pharmacologic Agent - OD - Right Eye       Time Out 06/25/2018. 2:33  PM. Confirmed correct patient, procedure, site, and patient consented.   Anesthesia Topical anesthesia was used. Anesthetic medications included Lidocaine 2%,  Proparacaine 0.5%.   Procedure Preparation included 5% betadine to ocular surface, eyelid speculum. A 30 gauge needle was used.   Injection:  2 mg aflibercept 2 MG/0.05ML   NDC: 61755-005-02, Lot: [id number], Expiration date: 05/21/2019   Route: Intravitreal, Site: Right Eye, Waste: 0.05 mL  Post-op Post injection exam found visual acuity of at least counting fingers. The patient tolerated the procedure well. There were no complications. The patient received written and verbal post procedure care education.                 ASSESSMENT/PLAN:    ICD-10-CM   1. 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 Intermediate stage nonexudative age-related macular degeneration of left eye H35.3122   3. Retinal edema H35.81 OCT, Retina - OU - Both Eyes  4. Posterior vitreous detachment of both eyes H43.813   5. 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 Pseudophakia of both eyes Z96.1     1. 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 Kathlen Mody  - S/P IVA OD #1 (05.20.19), #2 (07.02.19), #3 (07.30.19)  - S/P IVE OD #1 (08.27.19), #2 (09.24.19)  - OCT with SRHM/PED with overlying SRF/heme -- interval improvement/resolution in IRF/SRF  - VA remains CF, but pt reports subjective improvement in peripheral vision  - discussed findings and guarded prognosis  - switch in therapy to [facility] approved through [facility] [employer]  - recommend IVE OD #3 today (11.05.19)  - pt wishes to be treated with IVE OD  - RBA of procedure discussed, questions answered  - informed consent obtained and signed  - see procedure note  - Eylea paperwork and benefits investigation started on 07.30.19-pt states she has to fill out new application for 1950   - Approved through  [employer] for Eylea  - f/u in 6 weeks  2. Age related macular degeneration, non-exudative, left eye  - possible progression of atrophy on OCT today  - The incidence, anatomy, and pathology of dry AMD, risk of progression, and the AREDS and AREDS 2 study including smoking risks discussed with patient.  - continue amsler grid monitoring  3. Retinal edema as above  4. PVD / vitreous syneresis OU  Discussed findings and prognosis  No RT or RD on 360 peripheral exam  Reviewed s/s of RT/RD  Strict return precaution for any such RT/RD signs/symptoms   5. Pigmentary glaucoma OU-  - using latanoprost OU qhs - IOP good today - under the expert care of Dr. Quentin Ore - monitor  6. Pseudophakia OU  - s/p CE/IOL OU  - beautiful surgery, doing well  - monitor   Ophthalmic Meds Ordered this visit:  Meds ordered this encounter  Medications  . aflibercept (EYLEA) SOLN 2 mg       Return in about 6 weeks (around 08/06/2018) for F/U Exu ARMD OD, DFE, OCT.  There are no Patient Instructions on file for this visit.   Explained the diagnoses, plan, and follow up with the patient and they expressed understanding.  Patient expressed understanding of the importance of proper follow up care.   This document serves as a record of services  personally performed by Gardiner Sleeper, MD, PhD. It was created on their behalf by Ernest Mallick, OA, an ophthalmic assistant. The creation of this record is the provider's dictation and/or activities during the visit.    Electronically signed by: Ernest Mallick, OA  11.04.19 8:20 AM     Gardiner Sleeper, M.D., Ph.D. Diseases & Surgery of the Retina and Vitreous [facility]   I have reviewed the above documentation for accuracy and completeness, and I agree with the above. Gardiner Sleeper, M.D., Ph.D. 06/26/18 8:21 AM    Abbreviations: M myopia (nearsighted); A astigmatism; H hyperopia (farsighted); P presbyopia; Mrx spectacle  prescription;  CTL contact lenses; OD right eye; OS left eye; OU both eyes  XT exotropia; ET esotropia; PEK punctate epithelial keratitis; PEE punctate epithelial erosions; DES dry eye syndrome; MGD meibomian gland dysfunction; ATs artificial tears; PFAT's preservative free artificial tears; [place] nuclear sclerotic cataract; PSC posterior subcapsular cataract; ERM epi-retinal membrane; PVD posterior vitreous detachment; RD retinal detachment; DM diabetes mellitus; DR diabetic retinopathy; NPDR non-proliferative diabetic retinopathy; PDR proliferative diabetic retinopathy; CSME clinically significant macular edema; DME diabetic macular edema; dbh dot blot hemorrhages; CWS cotton wool spot; POAG primary open angle glaucoma; C/D cup-to-disc ratio; HVF humphrey visual field; GVF goldmann visual field; OCT optical coherence tomography; IOP intraocular pressure; BRVO Branch retinal vein occlusion; CRVO central retinal vein occlusion; CRAO central retinal artery occlusion; BRAO branch retinal artery occlusion; RT retinal tear; SB scleral buckle; PPV pars plana vitrectomy; VH Vitreous hemorrhage; PRP panretinal laser photocoagulation; IVK intravitreal kenalog; VMT vitreomacular traction; MH Macular hole;  NVD neovascularization of the disc; NVE neovascularization elsewhere; AREDS age related eye disease study; ARMD age related macular degeneration; POAG primary open angle glaucoma; EBMD epithelial/anterior basement membrane dystrophy; ACIOL anterior chamber intraocular lens; IOL intraocular lens; PCIOL posterior chamber intraocular lens; Phaco/IOL phacoemulsification with intraocular lens placement; [place] photorefractive keratectomy; LASIK laser assisted in situ keratomileusis; HTN hypertension; DM diabetes mellitus; COPD chronic obstructive pulmonary disease

## 2018-06-25 ENCOUNTER — Ambulatory Visit (INDEPENDENT_AMBULATORY_CARE_PROVIDER_SITE_OTHER): Payer: Medicare Other | Admitting: Ophthalmology

## 2018-06-25 ENCOUNTER — Encounter (INDEPENDENT_AMBULATORY_CARE_PROVIDER_SITE_OTHER): Payer: Self-pay | Admitting: Ophthalmology

## 2018-06-25 DIAGNOSIS — H3581 Retinal edema: Secondary | ICD-10-CM | POA: Diagnosis not present

## 2018-06-25 DIAGNOSIS — H43813 Vitreous degeneration, bilateral: Secondary | ICD-10-CM

## 2018-06-25 DIAGNOSIS — H353122 Nonexudative age-related macular degeneration, left eye, intermediate dry stage: Secondary | ICD-10-CM

## 2018-06-25 DIAGNOSIS — H401331 Pigmentary glaucoma, bilateral, mild stage: Secondary | ICD-10-CM

## 2018-06-25 DIAGNOSIS — H353211 Exudative age-related macular degeneration, right eye, with active choroidal neovascularization: Secondary | ICD-10-CM | POA: Diagnosis not present

## 2018-06-25 DIAGNOSIS — Z961 Presence of intraocular lens: Secondary | ICD-10-CM

## 2018-06-25 MED ORDER — AFLIBERCEPT 2MG/0.05ML IZ SOLN FOR KALEIDOSCOPE
2.0000 mg | INTRAVITREAL | Status: DC
  Administered 2018-06-25: 2 mg via INTRAVITREAL

## 2018-06-26 ENCOUNTER — Encounter (INDEPENDENT_AMBULATORY_CARE_PROVIDER_SITE_OTHER): Payer: Self-pay | Admitting: Family Medicine

## 2018-06-26 ENCOUNTER — Ambulatory Visit (INDEPENDENT_AMBULATORY_CARE_PROVIDER_SITE_OTHER): Payer: Medicare Other | Admitting: Family Medicine

## 2018-06-26 ENCOUNTER — Ambulatory Visit (INDEPENDENT_AMBULATORY_CARE_PROVIDER_SITE_OTHER): Payer: Medicare Other

## 2018-06-26 ENCOUNTER — Ambulatory Visit (INDEPENDENT_AMBULATORY_CARE_PROVIDER_SITE_OTHER): Payer: Self-pay

## 2018-06-26 ENCOUNTER — Encounter (INDEPENDENT_AMBULATORY_CARE_PROVIDER_SITE_OTHER): Payer: Self-pay | Admitting: Ophthalmology

## 2018-06-26 DIAGNOSIS — M25532 Pain in left wrist: Secondary | ICD-10-CM

## 2018-06-26 DIAGNOSIS — S52552D Other extraarticular fracture of lower end of left radius, subsequent encounter for closed fracture with routine healing: Secondary | ICD-10-CM

## 2018-06-26 DIAGNOSIS — S62111D Displaced fracture of triquetrum [cuneiform] bone, right wrist, subsequent encounter for fracture with routine healing: Secondary | ICD-10-CM

## 2018-06-26 NOTE — Progress Notes (Signed)
Office Visit Note   Patient: Marissa Caldwell           Date of Birth: Jun 11, 1932           MRN: [id number] Visit Date: 06/26/2018 Requested by: [name], MD [place], [place] [address] PCP: [name], MD  Subjective: Chief Complaint  Patient presents with  . followup bilateral wrists - cast removal left wrist    HPI: She is about a month status post right wrist triquetrum avulsion fracture and left wrist extra-articular distal radius fracture.  She is here for left wrist cast removal.  She is having some soreness in her right wrist from using it a lot, but overall she feels like it is getting better.  The left wrist has not been hurting it while in the cast.              ROS: Noncontributory  Objective: Vital Signs: There were no vitals taken for this visit.  Physical Exam:  Right wrist: Slightly tender to palpation on the ulnar side of her wrist near the triquetrum. Left wrist: Distal radius fracture site does not seem to be tender today.  She is more tender near the distal ulna.  No significant swelling or bruising.  Imaging: 2 view x-rays right wrist: Avulsion fracture no longer visible.  2 view x-rays left wrist: Abundant callus formation at the distal radius fracture site.  Assessment & Plan: 1.  1 month status post left wrist distal radius extra-articular fracture -Switch to a removable brace, start working on range of motion and grip strength.  Occupational therapy if she fails to progress.  Follow-up as needed.  2.  1 month status post avulsion fracture right wrist triquetrum -Range of motion and grip strength as above.  New removable wrist brace given.   Follow-Up Instructions: No follow-ups on file.      Procedures: No procedures performed  No notes on file    PMFS History: Patient Active Problem List   Diagnosis Date Noted  . Chronic diastolic heart failure ([place]) 06/25/2017  . COPD (chronic obstructive pulmonary  disease) ([place]) 04/13/2017  . Chest pain 03/27/2017  . S/P angioplasty with stent 03/26/17 DES to LCX, EF 60% 03/27/2017  . CAD (coronary artery disease), native coronary artery 03/26/2017  . Abnormal nuclear stress test 03/25/2017  . Hyperlipidemia 03/11/2017  . Hypothyroidism 03/11/2017  . Shortness of breath 03/11/2017   Past Medical History:  Diagnosis Date  . Anxiety   . Arthritis   . CAD (coronary artery disease)    a. 03/2017: 95% LCx stenosis --> PCI/DES placed  . Cataract   . Chest pain 03/27/2017  . Chronic diastolic heart failure ([place]) 06/25/2017  . Chronic lower back pain   . COPD (chronic obstructive pulmonary disease) ([place])   . Coronary artery disease   . Diverticulitis   . Diverticulosis   . GERD (gastroesophageal reflux disease)   . Glaucoma   . Heart failure, type unknown ([place]) 03/11/2017  . Hyperlipidemia 03/11/2017  . Hypothyroidism   . Pneumonia X 1   "years and years ago" (03/26/2017)  . Shortness of breath 03/11/2017  . Status post dilation of esophageal narrowing     Family History  Problem Relation Age of Onset  . CAD Mother   . Cataracts Mother   . Coronary artery disease Father   . Leukemia Sister   . Amblyopia Neg Hx   . Blindness Neg Hx   . Diabetes Neg  Hx   . Glaucoma Neg Hx   . Macular degeneration Neg Hx   . Retinal detachment Neg Hx   . Strabismus Neg Hx   . Retinitis pigmentosa Neg Hx     Past Surgical History:  Procedure Laterality Date  . BILATERAL OOPHORECTOMY Bilateral   . CATARACT EXTRACTION    . CATARACT EXTRACTION W/ INTRAOCULAR LENS  IMPLANT, BILATERAL Bilateral   . CORONARY ANGIOPLASTY WITH STENT PLACEMENT  03/26/2017  . LEFT HEART CATH AND CORONARY ANGIOGRAPHY N/A 03/26/2017   Procedure: Left Heart Cath and Coronary Angiography;  Surgeon: Belva Crome, MD;  Location: [facility];  Service: Cardiovascular;  Laterality: N/A;  . NERVE SURGERY     "lump on my sciatic nerve was removed years ago"  . SHOULDER OPEN ROTATOR CUFF  REPAIR Right   . THYROIDECTOMY, PARTIAL     Social History   Occupational History  . Occupation: retired  Tobacco Use  . Smoking status: Current Some Day Smoker    Packs/day: 0.50    Years: 60.00    Pack years: 30.00    Types: Cigarettes  . Smokeless tobacco: Never Used  Substance and Sexual Activity  . Alcohol use: Yes    Comment: twice a month  . Drug use: No  . Sexual activity: Never

## 2018-07-08 DIAGNOSIS — H401331 Pigmentary glaucoma, bilateral, mild stage: Secondary | ICD-10-CM | POA: Diagnosis not present

## 2018-07-08 DIAGNOSIS — Z961 Presence of intraocular lens: Secondary | ICD-10-CM | POA: Diagnosis not present

## 2018-07-08 DIAGNOSIS — H353211 Exudative age-related macular degeneration, right eye, with active choroidal neovascularization: Secondary | ICD-10-CM | POA: Diagnosis not present

## 2018-07-08 DIAGNOSIS — H353122 Nonexudative age-related macular degeneration, left eye, intermediate dry stage: Secondary | ICD-10-CM | POA: Diagnosis not present

## 2018-08-01 DIAGNOSIS — F419 Anxiety disorder, unspecified: Secondary | ICD-10-CM | POA: Diagnosis not present

## 2018-08-01 DIAGNOSIS — G4700 Insomnia, unspecified: Secondary | ICD-10-CM | POA: Diagnosis not present

## 2018-08-01 DIAGNOSIS — E440 Moderate protein-calorie malnutrition: Secondary | ICD-10-CM | POA: Diagnosis not present

## 2018-08-01 DIAGNOSIS — E785 Hyperlipidemia, unspecified: Secondary | ICD-10-CM | POA: Diagnosis not present

## 2018-08-06 NOTE — Progress Notes (Signed)
[facility] Note  08/07/2018     CHIEF COMPLAINT Patient presents for Retina Follow Up   HISTORY OF PRESENT ILLNESS: Marissa Caldwell is a 84 y.o. female who presents to the clinic today for:   HPI    Retina Follow Up    Patient presents with  Wet AMD.  In right eye.  This started 3 months ago.  Severity is mild.  Since onset it is stable.  I, the attending physician,  performed the HPI with the patient and updated documentation appropriately.          Comments    F/U EXU AMD OD. Patient states she has not any floaters since last ov,her vision has been "pretty good", denies new onsets.       Last edited by Bernarda Caffey, MD on 08/07/2018  1:57 PM. (History)    pt states she can see pretty well out of her peripheral vision, but cannot see letters on the eye chart  Referring physician: Hortencia Pilar, MD [place], [place] [address]  HISTORICAL INFORMATION:   Selected notes from the MEDICAL RECORD NUMBER Referred by Dr. Quentin Ore for concern of exudative ARMD OD LEE: 05.16.19 (C.Weaver) [BCVA: OD: 20/60 OS: 20/70] Ocular Hx-Psedudophakia OU, Glaucoma suspect, non-exudative ARMD OS, [place]ag PMH-HTN, Asthma    CURRENT MEDICATIONS: Current Outpatient Medications (Ophthalmic Drugs)  Medication Sig  . dorzolamide (TRUSOPT) 2 % ophthalmic solution Place 1 drop into both eyes 2 (two) times daily.  Marland Kitchen latanoprost (XALATAN) 0.005 % ophthalmic solution Place 1 drop into both eyes at bedtime.   Current Facility-Administered Medications (Ophthalmic Drugs)  Medication Route  . aflibercept (EYLEA) SOLN 2 mg Intravitreal  . aflibercept (EYLEA) SOLN 2 mg Intravitreal  . aflibercept (EYLEA) SOLN 2 mg Intravitreal  . aflibercept (EYLEA) SOLN 2 mg Intravitreal   Current Outpatient Medications (Other)  Medication Sig  . albuterol (PROVENTIL HFA;VENTOLIN HFA) 108 (90 Base) MCG/ACT inhaler Inhale 2 puffs into the lungs every 6 (six)  hours as needed (for shortness of breath/wheezing.).   Marland Kitchen alendronate (FOSAMAX) 70 MG tablet Take 70 mg by mouth once a week. Take with a full glass of water on an empty stomach.  Marland Kitchen aspirin 81 MG chewable tablet Chew 1 tablet (81 mg total) by mouth daily.  . budesonide-formoterol (SYMBICORT) 160-4.5 MCG/ACT inhaler Inhale 2 puffs into the lungs 2 (two) times daily.  . Calcium Carb-Cholecalciferol (CALCIUM + D3) 600-200 MG-UNIT TABS Take 1 tablet by mouth daily.  . carvedilol (COREG) 3.125 MG tablet Take 1 tablet (3.125 mg total) by mouth 2 (two) times daily.  . Cholecalciferol (VITAMIN D3) 2000 units capsule Take 4,000 Units by mouth daily.  . Coenzyme Q10 (COQ10) 100 MG CAPS Take 100 mg by mouth daily.  Marland Kitchen levothyroxine (SYNTHROID, LEVOTHROID) 100 MCG tablet TAKE 1 TABLET BY MOUTH ONCE DAILY ON AN EMPTY STOMACH IN THE MORNING FOR 30 DAYS  . levothyroxine (SYNTHROID, LEVOTHROID) 112 MCG tablet Take 112 mcg daily before breakfast by mouth.  Marland Kitchen LORazepam (ATIVAN) 0.5 MG tablet Take 0.5 mg by mouth at bedtime.   . LUTEIN PO Take 1 tablet by mouth daily.  . mirtazapine (REMERON) 15 MG tablet TAKE 1 TABLET BY MOUTH AT BEDTIME ONCE A DAY  . Multiple Vitamin (MULTIVITAMIN) tablet Take 1 tablet by mouth daily.  . nitroGLYCERIN (NITROSTAT) 0.4 MG SL tablet Place 1 tablet (0.4 mg total) under the tongue every 5 (five) minutes as needed for chest  pain.  . Omega-3 Fatty Acids (FISH OIL PO) Take 5 mLs by mouth daily.  Marland Kitchen oxyCODONE-acetaminophen (PERCOCET/ROXICET) 5-325 MG tablet Take 1-2 tablets by mouth every 6 (six) hours as needed for severe pain.  . pravastatin (PRAVACHOL) 20 MG tablet Take 1 tablet (20 mg total) by mouth daily.  . sertraline (ZOLOFT) 50 MG tablet Take 50 mg by mouth daily as needed (for anxiety.).   Marland Kitchen TOVIAZ 4 MG TB24 tablet TK 1 T PO  ONCE A DAY   Current Facility-Administered Medications (Other)  Medication Route  . Bevacizumab (AVASTIN) SOLN 1.25 mg Intravitreal  . Bevacizumab  (AVASTIN) SOLN 1.25 mg Intravitreal  . Bevacizumab (AVASTIN) SOLN 1.25 mg Intravitreal      REVIEW OF SYSTEMS: ROS    Positive for: Eyes   Negative for: Constitutional, Gastrointestinal, Neurological, Skin, Genitourinary, Musculoskeletal, HENT, Endocrine, Cardiovascular, Respiratory, Psychiatric, Allergic/Imm, Heme/Lymph   Last edited by Zenovia Jordan, LPN on 97/67/3419  3:79 PM. (History)       ALLERGIES Allergies  Allergen Reactions  . Lipitor [Atorvastatin]     Elevated LFT's    PAST MEDICAL HISTORY Past Medical History:  Diagnosis Date  . Anxiety   . Arthritis   . CAD (coronary artery disease)    a. 03/2017: 95% LCx stenosis --> PCI/DES placed  . Cataract   . Chest pain 03/27/2017  . Chronic diastolic heart failure ([place]) 06/25/2017  . Chronic lower back pain   . COPD (chronic obstructive pulmonary disease) ([place])   . Coronary artery disease   . Diverticulitis   . Diverticulosis   . GERD (gastroesophageal reflux disease)   . Glaucoma   . Heart failure, type unknown ([place]) 03/11/2017  . Hyperlipidemia 03/11/2017  . Hypothyroidism   . Pneumonia X 1   "years and years ago" (03/26/2017)  . Shortness of breath 03/11/2017  . Status post dilation of esophageal narrowing    Past Surgical History:  Procedure Laterality Date  . BILATERAL OOPHORECTOMY Bilateral   . CATARACT EXTRACTION    . CATARACT EXTRACTION W/ INTRAOCULAR LENS  IMPLANT, BILATERAL Bilateral   . CORONARY ANGIOPLASTY WITH STENT PLACEMENT  03/26/2017  . LEFT HEART CATH AND CORONARY ANGIOGRAPHY N/A 03/26/2017   Procedure: Left Heart Cath and Coronary Angiography;  Surgeon: Belva Crome, MD;  Location: [facility];  Service: Cardiovascular;  Laterality: N/A;  . NERVE SURGERY     "lump on my sciatic nerve was removed years ago"  . SHOULDER OPEN ROTATOR CUFF REPAIR Right   . THYROIDECTOMY, PARTIAL      FAMILY HISTORY Family History  Problem Relation Age of Onset  . CAD Mother   . Cataracts Mother    . Coronary artery disease Father   . Leukemia Sister   . Amblyopia Neg Hx   . Blindness Neg Hx   . Diabetes Neg Hx   . Glaucoma Neg Hx   . Macular degeneration Neg Hx   . Retinal detachment Neg Hx   . Strabismus Neg Hx   . Retinitis pigmentosa Neg Hx     SOCIAL HISTORY Social History   Tobacco Use  . Smoking status: Current Some Day Smoker    Packs/day: 0.50    Years: 60.00    Pack years: 30.00    Types: Cigarettes  . Smokeless tobacco: Never Used  Substance Use Topics  . Alcohol use: Yes    Comment: twice a month  . Drug use: No         OPHTHALMIC EXAM:  Base Eye Exam    Visual Acuity (Snellen - Linear)      Right Left   Dist cc CF at 3' 20/50   Dist ph cc  NI   Correction:  Glasses       Tonometry (Tonopen, 1:46 PM)      Right Left   Pressure 16 18       Pupils      Dark Light Shape React APD   Right 3 2 Round Minimal None   Left 3 2 Round Minimal None       Visual Fields (Counting fingers)      Left Right   Restrictions  Partial outer superior temporal, inferior temporal, superior nasal, inferior nasal deficiencies       Extraocular Movement      Right Left    Full, Ortho Full, Ortho       Neuro/Psych    Oriented x3:  Yes   Mood/Affect:  Normal       Dilation    Both eyes:  1.0% Mydriacyl, 2.5% Phenylephrine @ 1:43 PM        Slit Lamp and Fundus Exam    Slit Lamp Exam      Right Left   Lids/Lashes Dermatochalasis - upper lid, Telangiectasia Dermatochalasis - upper lid, Telangiectasia   Conjunctiva/Sclera White and quiet White and quiet   Cornea Arcus, 2+ diffuse Punctate epithelial erosions, diffuse endo pigment, decreased TBUT, Krukenberg's spindle Arcus, 2+ diffuse Punctate epithelial erosions, diffuse endo pigment, decreased TBUT, Krukenberg's spindle   Anterior Chamber Deep and quiet Deep and quiet   Iris Round and dilated Round and dilated   Lens Three piece PC IOL in good position, open PC Three piece PC IOL in good position,  clear PC   Vitreous Vitreous syneresis, Posterior vitreous detachment Mild Vitreous syneresis, Posterior vitreous detachment       Fundus Exam      Right Left   Disc 360 Peripapillary atrophy 360 Peripapillary atrophy, trace temporal pallow, sharp rim   C/D Ratio 0.2 0.1   Macula Blunted foveal reflex, Drusen, RPE mottling and clumping, RPE atrophy nasal macula,+PED, +CNVM, sub-retinal Disciform scar Blunted foveal reflex, Drusen, RPE mottling and clumping, RPE Atrophy   Vessels Vascular attenuation Vascular attenuation   Periphery Attached Attached          IMAGING AND PROCEDURES  Imaging and Procedures for @TODAY @  OCT, Retina - OU - Both Eyes       Right Eye Quality was good. Central Foveal Thickness: 374. Progression has been stable. Findings include subretinal hyper-reflective material, pigment epithelial detachment, choroidal neovascular membrane, retinal drusen , epiretinal membrane, normal foveal contour, no IRF, no SRF (Stably, improvement in IRF/SRF).   Left Eye Quality was good. Central Foveal Thickness: 216. Progression has been stable. Findings include abnormal foveal contour, retinal drusen , no IRF, no SRF, outer retinal atrophy, pigment epithelial detachment.   Notes *Images captured and stored on drive  Diagnosis / Impression:  OD: Exudative AMD - stably improved IRF/SRF, but large residual SRHM/PED component (disciform scar) OS: Non-exudative AMD, ? Progression of atrophy  Clinical management:  See below  Abbreviations: NFP - Normal foveal profile. CME - cystoid macular edema. PED - pigment epithelial detachment. IRF - intraretinal fluid. SRF - subretinal fluid. EZ - ellipsoid zone. ERM - epiretinal membrane. ORA - outer retinal atrophy. ORT - outer retinal tubulation. SRHM - subretinal hyper-reflective material         Intravitreal Injection, Pharmacologic Agent -  OD - Right Eye       Time Out 08/07/2018. 2:20 PM. Confirmed correct patient,  procedure, site, and patient consented.   Anesthesia Topical anesthesia was used. Anesthetic medications included Lidocaine 2%, Proparacaine 0.5%.   Procedure Preparation included 5% betadine to ocular surface, eyelid speculum. A 30 gauge needle was used.   Injection:  2 mg aflibercept Alfonse Flavors) SOLN   NDC: [id number], Lot: [id number], Expiration date: 06/20/2019   Route: Intravitreal, Site: Right Eye, Waste: 0.05 mL  Post-op Post injection exam found visual acuity of at least counting fingers. The patient tolerated the procedure well. There were no complications. The patient received written and verbal post procedure care education.                 ASSESSMENT/PLAN:    ICD-10-CM   1. 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 Intermediate stage nonexudative age-related macular degeneration of left eye H35.3122   3. Retinal edema H35.81 OCT, Retina - OU - Both Eyes  4. Posterior vitreous detachment of both eyes H43.813   5. 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 Pseudophakia of both eyes Z96.1     1. 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 Kathlen Mody  - S/P IVA OD #1 (05.20.19), #2 (07.02.19), #3 (07.30.19)  - S/P IVE OD #1 (08.27.19), #2 (09.24.19), #3 (11.05.19)  - OCT with SRHM/PED with overlying SRF/heme -- interval improvement/resolution in IRF/SRF  - VA remains CF, but pt reports subjective improvement in peripheral vision  - discussed findings and guarded prognosis  - switch in therapy to [facility] approved through [employer]  - recommend IVE OD #4 today (12.17.19) for maintenance w/ extension to 8 wks  - pt wishes to be treated with IVE OD  - RBA of procedure discussed, questions answered  - informed consent  obtained and signed  - see procedure note  - Eylea paperwork and benefits investigation started on 07.30.19-pt states she has to fill out new application for 0102   - Approved through [employer] for Eylea  - f/u in 8 weeks  2. Age related macular degeneration, non-exudative, left eye  - possible progression of atrophy on OCT today  - The incidence, anatomy, and pathology of dry AMD, risk of progression, and the AREDS and AREDS 2 study including smoking risks discussed with patient.  - continue amsler grid monitoring  3. Retinal edema as above  4. PVD / vitreous syneresis OU  Discussed findings and prognosis  No RT or RD on 360 peripheral exam  Reviewed s/s of RT/RD  Strict return precaution for any such RT/RD signs/symptoms   5. Pigmentary glaucoma OU-  - using latanoprost OU qhs - IOP good today - under the expert care of Dr. Quentin Ore - monitor  6. Pseudophakia OU  - s/p CE/IOL OU  - beautiful surgery, doing well  - monitor   Ophthalmic Meds Ordered this visit:  Meds ordered this encounter  Medications  . aflibercept (EYLEA) SOLN 2 mg       Return in about 8 weeks (around 10/02/2018) for F/U exu ARMD , DFE, OCT.  There are no Patient Instructions on file for this visit.   Explained the diagnoses, plan, and follow up with the patient and they expressed  understanding.  Patient expressed understanding of the importance of proper follow up care.   This document serves as a record of services personally performed by Gardiner Sleeper, MD, PhD. It was created on their behalf by Ernest Mallick, OA, an ophthalmic assistant. The creation of this record is the provider's dictation and/or activities during the visit.    Electronically signed by: Ernest Mallick, OA  12.17.19 3:57 PM    Gardiner Sleeper, M.D., Ph.D. Diseases & Surgery of the Retina and Vitreous [facility]  I have reviewed the above documentation for accuracy and completeness, and I agree  with the above. Gardiner Sleeper, M.D., Ph.D. 08/07/18 4:02 PM    Abbreviations: M myopia (nearsighted); A astigmatism; H hyperopia (farsighted); P presbyopia; Mrx spectacle prescription;  CTL contact lenses; OD right eye; OS left eye; OU both eyes  XT exotropia; ET esotropia; PEK punctate epithelial keratitis; PEE punctate epithelial erosions; DES dry eye syndrome; MGD meibomian gland dysfunction; ATs artificial tears; PFAT's preservative free artificial tears; [place] nuclear sclerotic cataract; PSC posterior subcapsular cataract; ERM epi-retinal membrane; PVD posterior vitreous detachment; RD retinal detachment; DM diabetes mellitus; DR diabetic retinopathy; NPDR non-proliferative diabetic retinopathy; PDR proliferative diabetic retinopathy; CSME clinically significant macular edema; DME diabetic macular edema; dbh dot blot hemorrhages; CWS cotton wool spot; POAG primary open angle glaucoma; C/D cup-to-disc ratio; HVF humphrey visual field; GVF goldmann visual field; OCT optical coherence tomography; IOP intraocular pressure; BRVO Branch retinal vein occlusion; CRVO central retinal vein occlusion; CRAO central retinal artery occlusion; BRAO branch retinal artery occlusion; RT retinal tear; SB scleral buckle; PPV pars plana vitrectomy; VH Vitreous hemorrhage; PRP panretinal laser photocoagulation; IVK intravitreal kenalog; VMT vitreomacular traction; MH Macular hole;  NVD neovascularization of the disc; NVE neovascularization elsewhere; AREDS age related eye disease study; ARMD age related macular degeneration; POAG primary open angle glaucoma; EBMD epithelial/anterior basement membrane dystrophy; ACIOL anterior chamber intraocular lens; IOL intraocular lens; PCIOL posterior chamber intraocular lens; Phaco/IOL phacoemulsification with intraocular lens placement; [place] photorefractive keratectomy; LASIK laser assisted in situ keratomileusis; HTN hypertension; DM diabetes mellitus; COPD chronic obstructive pulmonary  disease

## 2018-08-07 ENCOUNTER — Ambulatory Visit (INDEPENDENT_AMBULATORY_CARE_PROVIDER_SITE_OTHER): Payer: Medicare Other | Admitting: Ophthalmology

## 2018-08-07 ENCOUNTER — Encounter (INDEPENDENT_AMBULATORY_CARE_PROVIDER_SITE_OTHER): Payer: Self-pay | Admitting: Ophthalmology

## 2018-08-07 DIAGNOSIS — Z961 Presence of intraocular lens: Secondary | ICD-10-CM

## 2018-08-07 DIAGNOSIS — H3581 Retinal edema: Secondary | ICD-10-CM

## 2018-08-07 DIAGNOSIS — H353122 Nonexudative age-related macular degeneration, left eye, intermediate dry stage: Secondary | ICD-10-CM

## 2018-08-07 DIAGNOSIS — H353211 Exudative age-related macular degeneration, right eye, with active choroidal neovascularization: Secondary | ICD-10-CM

## 2018-08-07 DIAGNOSIS — H43813 Vitreous degeneration, bilateral: Secondary | ICD-10-CM

## 2018-08-07 DIAGNOSIS — H401331 Pigmentary glaucoma, bilateral, mild stage: Secondary | ICD-10-CM

## 2018-08-07 MED ORDER — AFLIBERCEPT 2MG/0.05ML IZ SOLN FOR KALEIDOSCOPE
2.0000 mg | INTRAVITREAL | Status: DC
  Administered 2018-08-07: 2 mg via INTRAVITREAL

## 2018-08-12 ENCOUNTER — Telehealth: Payer: Self-pay | Admitting: *Deleted

## 2018-08-12 DIAGNOSIS — E785 Hyperlipidemia, unspecified: Secondary | ICD-10-CM

## 2018-08-12 NOTE — Telephone Encounter (Signed)
Follow up ° ° °Patient is returning your call for lab results. ° ° ° °

## 2018-08-12 NOTE — Telephone Encounter (Signed)
No answer and no machine, verified number

## 2018-08-12 NOTE — Telephone Encounter (Signed)
-----   Message from Skeet Latch, MD sent at 08/09/2018  3:00 PM EST ----- Cholesterol levels are still elevated.  They did not check LFTs.  She needs LFTs and if normal then increase pravastatin to 40mg .

## 2018-08-12 NOTE — Telephone Encounter (Signed)
Left message to call back  

## 2018-08-22 NOTE — Telephone Encounter (Signed)
Call and spoke with pt. Pt aware of Dr.Teague's recommendation. Notes recorded by Skeet Latch, MD on 08/09/2018 at 3:00 PM EST Cholesterol levels are still elevated. They did not check LFTs. She needs LFTs and if normal then increase pravastatin to 40mg .   Pt will come to our office for LFT's. Adv pt of lab hours, and that no appt is needed. Lab order in Epic. Pt verbalized understanding and voiced appreciation for the call.

## 2018-08-28 ENCOUNTER — Other Ambulatory Visit: Payer: Self-pay | Admitting: *Deleted

## 2018-08-28 DIAGNOSIS — E785 Hyperlipidemia, unspecified: Secondary | ICD-10-CM | POA: Diagnosis not present

## 2018-08-28 LAB — HEPATIC FUNCTION PANEL
ALT: 21 IU/L (ref 0–32)
AST: 30 IU/L (ref 0–40)
Albumin: 4.1 g/dL (ref 3.5–4.7)
Alkaline Phosphatase: 78 IU/L (ref 39–117)
Bilirubin Total: 0.4 mg/dL (ref 0.0–1.2)
Bilirubin, Direct: 0.13 mg/dL (ref 0.00–0.40)
Total Protein: 6.9 g/dL (ref 6.0–8.5)

## 2018-08-30 ENCOUNTER — Telehealth: Payer: Self-pay | Admitting: *Deleted

## 2018-08-30 DIAGNOSIS — Z5181 Encounter for therapeutic drug level monitoring: Secondary | ICD-10-CM

## 2018-08-30 DIAGNOSIS — E785 Hyperlipidemia, unspecified: Secondary | ICD-10-CM

## 2018-08-30 DIAGNOSIS — E7800 Pure hypercholesterolemia, unspecified: Secondary | ICD-10-CM

## 2018-08-30 MED ORDER — PRAVASTATIN SODIUM 40 MG PO TABS: 40.0000 mg | ORAL_TABLET | Freq: Every day | ORAL | 1 refills | Status: DC

## 2018-08-30 NOTE — Telephone Encounter (Signed)
-----   Message from Skeet Latch, MD sent at 08/29/2018 12:25 PM EST ----- Liver enzymes have normalized.  Let us increase the pravastatin to 40 mg and repeat her lipids and CMP in 6 weeks.

## 2018-08-30 NOTE — Telephone Encounter (Signed)
Advised patient of lab results and medication changes. New Rx sent to mail order and mailed lab order forms.

## 2018-10-02 ENCOUNTER — Encounter (INDEPENDENT_AMBULATORY_CARE_PROVIDER_SITE_OTHER): Payer: Medicare Other | Admitting: Ophthalmology

## 2018-10-11 ENCOUNTER — Encounter (INDEPENDENT_AMBULATORY_CARE_PROVIDER_SITE_OTHER): Payer: Self-pay | Admitting: Ophthalmology

## 2018-10-11 ENCOUNTER — Ambulatory Visit (INDEPENDENT_AMBULATORY_CARE_PROVIDER_SITE_OTHER): Payer: Medicare Other | Admitting: Ophthalmology

## 2018-10-11 DIAGNOSIS — H353122 Nonexudative age-related macular degeneration, left eye, intermediate dry stage: Secondary | ICD-10-CM | POA: Diagnosis not present

## 2018-10-11 DIAGNOSIS — Z961 Presence of intraocular lens: Secondary | ICD-10-CM

## 2018-10-11 DIAGNOSIS — H3581 Retinal edema: Secondary | ICD-10-CM

## 2018-10-11 DIAGNOSIS — H43813 Vitreous degeneration, bilateral: Secondary | ICD-10-CM

## 2018-10-11 DIAGNOSIS — H353211 Exudative age-related macular degeneration, right eye, with active choroidal neovascularization: Secondary | ICD-10-CM | POA: Diagnosis not present

## 2018-10-11 DIAGNOSIS — H401331 Pigmentary glaucoma, bilateral, mild stage: Secondary | ICD-10-CM

## 2018-10-11 MED ORDER — AFLIBERCEPT 2MG/0.05ML IZ SOLN FOR KALEIDOSCOPE
2.0000 mg | INTRAVITREAL | Status: DC
  Administered 2018-10-11: 2 mg via INTRAVITREAL

## 2018-10-11 NOTE — Progress Notes (Signed)
[facility] Note  10/11/2018     CHIEF COMPLAINT Patient presents for Retina Follow Up   HISTORY OF PRESENT ILLNESS: Marissa Caldwell is a 85 y.o. female who presents to the clinic today for:   HPI    Retina Follow Up    Patient presents with  Wet AMD.  In right eye.  Severity is severe.  Duration of 8 weeks.  I, the attending physician,  performed the HPI with the patient and updated documentation appropriately.          Comments    F/U EXU ARMD OD. Patient states her vision is "about the same" as last ov. Pt is using gtt's as instructed  per patient is ready for [place] today if indicted.       Last edited by Bernarda Caffey, MD on 10/11/2018  2:56 PM. (History)    pt had to reschedule her last appt due to Dr. Coralyn Pear having an emergency sx, pt states she can see good at a distance except there is one spot in her central vision  Referring physician: [name], MD [place], [place] [id number]  HISTORICAL INFORMATION:   Selected notes from the MEDICAL RECORD NUMBER Referred by Dr. Quentin Ore for concern of exudative ARMD OD LEE: 05.16.19 (C.We[place]) [BCVA: OD: 20/60 OS: 20/70] Ocular Hx-Psedudophakia OU, Glaucoma suspect, non-exudative ARMD OS, [place] PMH-HTN, Asthma    CURRENT MEDICATIONS: Current Outpatient Medications (Ophthalmic Drugs)  Medication Sig  . dorzolamide (TRUSOPT) 2 % ophthalmic solution Place 1 drop into both eyes 2 (two) times daily.  Marland Kitchen latanoprost (XALATAN) 0.005 % ophthalmic solution Place 1 drop into both eyes at bedtime.   Current Facility-Administered Medications (Ophthalmic Drugs)  Medication Route  . aflibercept (EYLEA) SOLN 2 mg Intravitreal  . aflibercept (EYLEA) SOLN 2 mg Intravitreal  . aflibercept (EYLEA) SOLN 2 mg Intravitreal  . aflibercept (EYLEA) SOLN 2 mg Intravitreal  . aflibercept (EYLEA) SOLN 2 mg Intravitreal   Current Outpatient Medications (Other)  Medication Sig  . albuterol  (PROVENTIL HFA;VENTOLIN HFA) 108 (90 Base) MCG/ACT inhaler Inhale 2 puffs into the lungs every 6 (six) hours as needed (for shortness of breath/wheezing.).   Marland Kitchen alendronate (FOSAMAX) 70 MG tablet Take 70 mg by mouth once a week. Take with a full glass of water on an empty stomach.  Marland Kitchen aspirin 81 MG chewable tablet Chew 1 tablet (81 mg total) by mouth daily.  . budesonide-formoterol (SYMBICORT) 160-4.5 MCG/ACT inhaler Inhale 2 puffs into the lungs 2 (two) times daily.  . Calcium Carb-Cholecalciferol (CALCIUM + D3) 600-200 MG-UNIT TABS Take 1 tablet by mouth daily.  . Cholecalciferol (VITAMIN D3) 2000 units capsule Take 4,000 Units by mouth daily.  . Coenzyme Q10 (COQ10) 100 MG CAPS Take 100 mg by mouth daily.  Marland Kitchen levothyroxine (SYNTHROID, LEVOTHROID) 100 MCG tablet TAKE 1 TABLET BY MOUTH ONCE DAILY ON AN EMPTY STOMACH IN THE MORNING FOR 30 DAYS  . levothyroxine (SYNTHROID, LEVOTHROID) 112 MCG tablet Take 112 mcg daily before breakfast by mouth.  Marland Kitchen LORazepam (ATIVAN) 0.5 MG tablet Take 0.5 mg by mouth at bedtime.   . LUTEIN PO Take 1 tablet by mouth daily.  . mirtazapine (REMERON) 15 MG tablet TAKE 1 TABLET BY MOUTH AT BEDTIME ONCE A DAY  . Multiple Vitamin (MULTIVITAMIN) tablet Take 1 tablet by mouth daily.  . nitroGLYCERIN (NITROSTAT) 0.4 MG SL tablet Place 1 tablet (0.4 mg total) under the tongue every 5 (five) minutes  as needed for chest pain.  . Omega-3 Fatty Acids (FISH OIL PO) Take 5 mLs by mouth daily.  Marland Kitchen oxyCODONE-acetaminophen (PERCOCET/ROXICET) 5-325 MG tablet Take 1-2 tablets by mouth every 6 (six) hours as needed for severe pain.  . pravastatin (PRAVACHOL) 40 MG tablet Take 1 tablet (40 mg total) by mouth daily.  . sertraline (ZOLOFT) 50 MG tablet Take 50 mg by mouth daily as needed (for anxiety.).   Marland Kitchen TOVIAZ 4 MG TB24 tablet TK 1 T PO  ONCE A DAY  . carvedilol (COREG) 3.125 MG tablet Take 1 tablet (3.125 mg total) by mouth 2 (two) times daily.   Current Facility-Administered Medications  (Other)  Medication Route  . Bevacizumab (AVASTIN) SOLN 1.25 mg Intravitreal  . Bevacizumab (AVASTIN) SOLN 1.25 mg Intravitreal  . Bevacizumab (AVASTIN) SOLN 1.25 mg Intravitreal      REVIEW OF SYSTEMS: ROS    Positive for: Eyes   Negative for: Constitutional, Gastrointestinal, Neurological, Skin, Genitourinary, Musculoskeletal, HENT, Endocrine, Cardiovascular, Respiratory, Psychiatric, Allergic/Imm, Heme/Lymph   Last edited by Roselee Nova D on 10/11/2018  2:26 PM. (History)       ALLERGIES Allergies  Allergen Reactions  . Lipitor [Atorvastatin]     Elevated LFT's    PAST MEDICAL HISTORY Past Medical History:  Diagnosis Date  . Anxiety   . Arthritis   . CAD (coronary artery disease)    a. 03/2017: 95% LCx stenosis --> PCI/DES placed  . Cataract   . Chest pain 03/27/2017  . Chronic diastolic heart failure ([place]) 06/25/2017  . Chronic lower back pain   . COPD (chronic obstructive pulmonary disease) ([place])   . Coronary artery disease   . Diverticulitis   . Diverticulosis   . GERD (gastroesophageal reflux disease)   . Glaucoma   . Heart failure, type unknown ([place]) 03/11/2017  . Hyperlipidemia 03/11/2017  . Hypothyroidism   . Pneumonia X 1   "years and years ago" (03/26/2017)  . Shortness of breath 03/11/2017  . Status post dilation of esophageal narrowing    Past Surgical History:  Procedure Laterality Date  . BILATERAL OOPHORECTOMY Bilateral   . CATARACT EXTRACTION    . CATARACT EXTRACTION W/ INTRAOCULAR LENS  IMPLANT, BILATERAL Bilateral   . CORONARY ANGIOPLASTY WITH STENT PLACEMENT  03/26/2017  . LEFT HEART CATH AND CORONARY ANGIOGRAPHY N/A 03/26/2017   Procedure: Left Heart Cath and Coronary Angiography;  Surgeon: Belva Crome, MD;  Location: [facility];  Service: Cardiovascular;  Laterality: N/A;  . NERVE SURGERY     "lump on my sciatic nerve was removed years ago"  . SHOULDER OPEN ROTATOR CUFF REPAIR Right   . THYROIDECTOMY, PARTIAL      FAMILY  HISTORY Family History  Problem Relation Age of Onset  . CAD Mother   . Cataracts Mother   . Coronary artery disease Father   . Leukemia Sister   . Amblyopia Neg Hx   . Blindness Neg Hx   . Diabetes Neg Hx   . Glaucoma Neg Hx   . Macular degeneration Neg Hx   . Retinal detachment Neg Hx   . Strabismus Neg Hx   . Retinitis pigmentosa Neg Hx     SOCIAL HISTORY Social History   Tobacco Use  . Smoking status: Current Some Day Smoker    Packs/day: 0.50    Years: 60.00    Pack years: 30.00    Types: Cigarettes  . Smokeless tobacco: Never Used  Substance Use Topics  . Alcohol use: Yes  Comment: twice a month  . Drug use: No         OPHTHALMIC EXAM:  Base Eye Exam    Visual Acuity (Snellen - Linear)      Right Left   Dist cc CF at 3' 20/40 -2   Dist ph cc  NI   Correction:  Glasses       Tonometry (Tonopen, 2:38 PM)      Right Left   Pressure 18 14       Pupils      Dark Light Shape React APD   Right 4 3 Round Sluggish None   Left 4 3 Round Brisk None       Visual Fields (Counting fingers)      Left Right    Full    Restrictions  Partial outer superior temporal, inferior temporal, superior nasal, inferior nasal deficiencies       Extraocular Movement      Right Left    Full, Ortho Full, Ortho       Neuro/Psych    Oriented x3:  Yes   Mood/Affect:  Normal       Dilation    Both eyes:  1.0% Mydriacyl, 2.5% Phenylephrine @ 2:37 PM        Slit Lamp and Fundus Exam    Slit Lamp Exam      Right Left   Lids/Lashes Dermatochalasis - upper lid, Telangiectasia Dermatochalasis - upper lid, Telangiectasia   Conjunctiva/Sclera White and quiet White and quiet   Cornea Arcus, 2+ diffuse Punctate epithelial erosions, diffuse endo pigment, decreased TBUT, Krukenberg's spindle Arcus, 2+ diffuse Punctate epithelial erosions, diffuse endo pigment, decreased TBUT, Krukenberg's spindle   Anterior Chamber Deep and quiet Deep and quiet   Iris Round and dilated  Round and dilated   Lens Three piece PC IOL in good position, open PC Three piece PC IOL in good position, clear PC   Vitreous Vitreous syneresis, Posterior vitreous detachment Mild Vitreous syneresis, Posterior vitreous detachment       Fundus Exam      Right Left   Disc 360 Peripapillary atrophy, disc heme at 0900 360 Peripapillary atrophy, trace temporal pallow, sharp rim   C/D Ratio 0.2 0.1   Macula Blunted foveal reflex, Drusen, RPE mottling and clumping, RPE atrophy nasal macula,+PED, +CNVM, sub-retinal Disciform scar, peripapillary heme at 0900 Blunted foveal reflex, Drusen, RPE mottling, clumping, and Atrophy   Vessels Vascular attenuation Vascular attenuation   Periphery Attached Attached          IMAGING AND PROCEDURES  Imaging and Procedures for @TODAY @  OCT, Retina - OU - Both Eyes       Right Eye Quality was good. Central Foveal Thickness: 297. Progression has been stable. Findings include subretinal hyper-reflective material, pigment epithelial detachment, choroidal neovascular membrane, retinal drusen , epiretinal membrane, normal foveal contour, no IRF, no SRF, disciform scar (Stable, improvement in IRF/SRF).   Left Eye Quality was good. Central Foveal Thickness: 216. Progression has been stable. Findings include abnormal foveal contour, retinal drusen , no IRF, no SRF, outer retinal atrophy, pigment epithelial detachment.   Notes *Images captured and stored on drive  Diagnosis / Impression:  OD: Exudative AMD - stably improved IRF/SRF, but large residual SRHM/PED component (disciform scar) OS: Non-exudative AMD, ? Progression of atrophy  Clinical management:  See below  Abbreviations: NFP - Normal foveal profile. CME - cystoid macular edema. PED - pigment epithelial detachment. IRF - intraretinal fluid. SRF - subretinal fluid. EZ -  ellipsoid zone. ERM - epiretinal membrane. ORA - outer retinal atrophy. ORT - outer retinal tubulation. SRHM - subretinal  hyper-reflective material         Intravitreal Injection, Pharmacologic Agent - OD - Right Eye       Time Out 10/11/2018. 2:55 PM. Confirmed correct patient, procedure, site, and patient consented.   Anesthesia Topical anesthesia was used. Anesthetic medications included Lidocaine 2%, Proparacaine 0.5%.   Procedure Preparation included 5% betadine to ocular surface, eyelid speculum. A 30 gauge needle was used.   Injection:  2 mg aflibercept Alfonse Flavors) SOLN   NDC: M7179715, Lot: [id number], Expiration date: 04/20/2019   Route: Intravitreal, Site: Right Eye, Waste: 0.05 mL  Post-op Post injection exam found visual acuity of at least counting fingers. The patient tolerated the procedure well. There were no complications. The patient received written and verbal post procedure care education.                 ASSESSMENT/PLAN:    ICD-10-CM   1. 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 Intermediate stage nonexudative age-related macular degeneration of left eye H35.3122   3. Retinal edema H35.81 OCT, Retina - OU - Both Eyes  4. Posterior vitreous detachment of both eyes H43.813   5. 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 Pseudophakia of both eyes Z96.1     1. 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 Kathlen Mody  - S/P IVA OD #1 (05.20.19), #2 (07.02.19), #3 (07.30.19)  - S/P IVE OD #1 (08.27.19), #2 (09.24.19), #3 (11.05.19), #4 (12.17.19)  - OCT with stable SRHM/PED with overlying SRF/heme -- stable IRF/SRF  - VA remains CF, but pt reports subjective improvement in peripheral vision  - exam shows new peripapillary hemorrhage OD -- 0900  - discussed findings and guarded prognosis  - switch in therapy to [facility]  approved through [facility]  - recommend IVE OD #5 today (02.21.20)  - pt wishes to be treated with IVE OD  - RBA of procedure discussed, questions answered  - informed consent obtained and signed  - see procedure note  - Eylea paperwork and benefits investigation started on 07.30.19- Approved for 2020 [employer]  - f/u in 6 weeks -- DFE/OCT/ possible injection  2. Age related macular degeneration, non-exudative, left eye  - possible progression of atrophy on OCT today  - The incidence, anatomy, and pathology of dry AMD, risk of progression, and the AREDS and AREDS 2 study including smoking risks discussed with patient.  - continue amsler grid monitoring  3. Retinal edema as above  4. PVD / vitreous syneresis OU  Discussed findings and prognosis  No RT or RD on 360 peripheral exam  Reviewed s/s of RT/RD  Strict return precautions for any such RT/RD signs/symptoms   5. Pigmentary glaucoma OU-  - using latanoprost OU qhs - IOP good today - under the expert care of Dr. Quentin Ore - monitor  6. Pseudophakia OU  - s/p CE/IOL OU  - beautiful surgery, doing well  - monitor   Ophthalmic Meds Ordered this visit:  Meds ordered this encounter  Medications  . aflibercept (EYLEA) SOLN 2 mg       Return in about 6 weeks (around 11/22/2018) for f/u exu ARMD OD,  DFE, OCT.  There are no Patient Instructions on file for this visit.   Explained the diagnoses, plan, and follow up with the patient and they expressed understanding.  Patient expressed understanding of the importance of proper follow up care.   This document serves as a record of services personally performed by Gardiner Sleeper, MD, PhD. It was created on their behalf by Ernest Mallick, OA, an ophthalmic assistant. The creation of this record is the provider's dictation and/or activities during the visit.    Electronically signed by: Ernest Mallick, OA  02.21.2020 3:26 PM    Gardiner Sleeper, M.D., Ph.D. Diseases & Surgery of  the Retina and Vitreous [facility]  I have reviewed the above documentation for accuracy and completeness, and I agree with the above. Gardiner Sleeper, M.D., Ph.D. 10/11/18 3:26 PM     Abbreviations: M myopia (nearsighted); A astigmatism; H hyperopia (farsighted); P presbyopia; Mrx spectacle prescription;  CTL contact lenses; OD right eye; OS left eye; OU both eyes  XT exotropia; ET esotropia; PEK punctate epithelial keratitis; PEE punctate epithelial erosions; DES dry eye syndrome; MGD meibomian gland dysfunction; ATs artificial tears; PFAT's preservative free artificial tears; [place] nuclear sclerotic cataract; PSC posterior subcapsular cataract; ERM epi-retinal membrane; PVD posterior vitreous detachment; RD retinal detachment; DM diabetes mellitus; DR diabetic retinopathy; NPDR non-proliferative diabetic retinopathy; PDR proliferative diabetic retinopathy; CSME clinically significant macular edema; DME diabetic macular edema; dbh dot blot hemorrhages; CWS cotton wool spot; POAG primary open angle glaucoma; C/D cup-to-disc ratio; HVF humphrey visual field; GVF goldmann visual field; OCT optical coherence tomography; IOP intraocular pressure; BRVO Branch retinal vein occlusion; CRVO central retinal vein occlusion; CRAO central retinal artery occlusion; BRAO branch retinal artery occlusion; RT retinal tear; SB scleral buckle; PPV pars plana vitrectomy; VH Vitreous hemorrhage; PRP panretinal laser photocoagulation; IVK intravitreal kenalog; VMT vitreomacular traction; MH Macular hole;  NVD neovascularization of the disc; NVE neovascularization elsewhere; AREDS age related eye disease study; ARMD age related macular degeneration; POAG primary open angle glaucoma; EBMD epithelial/anterior basement membrane dystrophy; ACIOL anterior chamber intraocular lens; IOL intraocular lens; PCIOL posterior chamber intraocular lens; Phaco/IOL phacoemulsification with intraocular lens placement; [place]  photorefractive keratectomy; LASIK laser assisted in situ keratomileusis; HTN hypertension; DM diabetes mellitus; COPD chronic obstructive pulmonary disease

## 2018-11-10 ENCOUNTER — Other Ambulatory Visit: Payer: Self-pay | Admitting: Cardiovascular Disease

## 2018-11-12 ENCOUNTER — Other Ambulatory Visit: Payer: Self-pay | Admitting: Cardiovascular Disease

## 2018-11-25 ENCOUNTER — Encounter (INDEPENDENT_AMBULATORY_CARE_PROVIDER_SITE_OTHER): Payer: Medicare Other | Admitting: Ophthalmology

## 2018-12-23 NOTE — Progress Notes (Signed)
[facility] Note  12/24/2018     CHIEF COMPLAINT Patient presents for Retina Follow Up   HISTORY OF PRESENT ILLNESS: Marissa Caldwell is a 85 y.o. female who presents to the clinic today for:   HPI    Retina Follow Up    Patient presents with  Wet AMD.  In right eye.  Severity is moderate.  Duration of 11 weeks.  Since onset it is stable.  I, the attending physician,  performed the HPI with the patient and updated documentation appropriately.          Comments    11 week retina follow up for Exu Armd OD. Patient states vision seems no worse.       Last edited by Bernarda Caffey, MD on 12/24/2018 12:58 PM. (History)    pt states she feels like the injections are helping her peripheral vision  Referring physician: [name], MD [place], [place] [id number]  HISTORICAL INFORMATION:   Selected notes from the MEDICAL RECORD NUMBER Referred by Dr. Quentin Ore for concern of exudative ARMD OD LEE: 05.16.19 (C.[place]) [BCVA: OD: 20/60 OS: 20/70] Ocular Hx-Psedudophakia OU, Glaucoma suspect, non-exudative ARMD OS, Yag PMH-HTN, Asthma    CURRENT MEDICATIONS: Current Outpatient Medications (Ophthalmic Drugs)  Medication Sig  . dorzolamide (TRUSOPT) 2 % ophthalmic solution Place 1 drop into both eyes 2 (two) times daily.  Marland Kitchen latanoprost (XALATAN) 0.005 % ophthalmic solution Place 1 drop into both eyes at bedtime.   Current Facility-Administered Medications (Ophthalmic Drugs)  Medication Route  . aflibercept (EYLEA) SOLN 2 mg Intravitreal  . aflibercept (EYLEA) SOLN 2 mg Intravitreal  . aflibercept (EYLEA) SOLN 2 mg Intravitreal  . aflibercept (EYLEA) SOLN 2 mg Intravitreal  . aflibercept (EYLEA) SOLN 2 mg Intravitreal   Current Outpatient Medications (Other)  Medication Sig  . albuterol (PROVENTIL HFA;VENTOLIN HFA) 108 (90 Base) MCG/ACT inhaler Inhale 2 puffs into the lungs every 6 (six) hours as needed (for shortness of  breath/wheezing.).   Marland Kitchen alendronate (FOSAMAX) 70 MG tablet Take 70 mg by mouth once a week. Take with a full glass of water on an empty stomach.  Marland Kitchen aspirin 81 MG chewable tablet Chew 1 tablet (81 mg total) by mouth daily.  . budesonide-formoterol (SYMBICORT) 160-4.5 MCG/ACT inhaler Inhale 2 puffs into the lungs 2 (two) times daily.  . Calcium Carb-Cholecalciferol (CALCIUM + D3) 600-200 MG-UNIT TABS Take 1 tablet by mouth daily.  . carvedilol (COREG) 3.125 MG tablet TAKE 1 TABLET BY MOUTH TWICE DAILY  . Cholecalciferol (VITAMIN D3) 2000 units capsule Take 4,000 Units by mouth daily.  . Coenzyme Q10 (COQ10) 100 MG CAPS Take 100 mg by mouth daily.  Marland Kitchen levothyroxine (SYNTHROID, LEVOTHROID) 100 MCG tablet TAKE 1 TABLET BY MOUTH ONCE DAILY ON AN EMPTY STOMACH IN THE MORNING FOR 30 DAYS  . levothyroxine (SYNTHROID, LEVOTHROID) 112 MCG tablet Take 112 mcg daily before breakfast by mouth.  Marland Kitchen LORazepam (ATIVAN) 0.5 MG tablet Take 0.5 mg by mouth at bedtime.   . LUTEIN PO Take 1 tablet by mouth daily.  . mirtazapine (REMERON) 15 MG tablet TAKE 1 TABLET BY MOUTH AT BEDTIME ONCE A DAY  . Multiple Vitamin (MULTIVITAMIN) tablet Take 1 tablet by mouth daily.  . nitroGLYCERIN (NITROSTAT) 0.4 MG SL tablet Place 1 tablet (0.4 mg total) under the tongue every 5 (five) minutes as needed for chest pain.  . Omega-3 Fatty Acids (FISH OIL PO) Take 5 mLs by mouth  daily.  . oxyCODONE-acetaminophen (PERCOCET/ROXICET) 5-325 MG tablet Take 1-2 tablets by mouth every 6 (six) hours as needed for severe pain.  . pravastatin (PRAVACHOL) 40 MG tablet Take 1 tablet (40 mg total) by mouth daily.  . sertraline (ZOLOFT) 50 MG tablet Take 50 mg by mouth daily as needed (for anxiety.).   Marland Kitchen TOVIAZ 4 MG TB24 tablet TK 1 T PO  ONCE A DAY   Current Facility-Administered Medications (Other)  Medication Route  . Bevacizumab (AVASTIN) SOLN 1.25 mg Intravitreal  . Bevacizumab (AVASTIN) SOLN 1.25 mg Intravitreal  . Bevacizumab (AVASTIN) SOLN  1.25 mg Intravitreal      REVIEW OF SYSTEMS: ROS    Positive for: Eyes   Negative for: Constitutional, Gastrointestinal, Neurological, Skin, Genitourinary, Musculoskeletal, HENT, Endocrine, Cardiovascular, Respiratory, Psychiatric, Allergic/Imm, Heme/Lymph   Last edited by Elmore Guise on 12/24/2018 12:36 PM. (History)       ALLERGIES Allergies  Allergen Reactions  . Lipitor [Atorvastatin]     Elevated LFT's    PAST MEDICAL HISTORY Past Medical History:  Diagnosis Date  . Anxiety   . Arthritis   . CAD (coronary artery disease)    a. 03/2017: 95% LCx stenosis --> PCI/DES placed  . Cataract   . Chest pain 03/27/2017  . Chronic diastolic heart failure ([place]) 06/25/2017  . Chronic lower back pain   . COPD (chronic obstructive pulmonary disease) ([place])   . Coronary artery disease   . Diverticulitis   . Diverticulosis   . GERD (gastroesophageal reflux disease)   . Glaucoma   . Heart failure, type unknown ([place]) 03/11/2017  . Hyperlipidemia 03/11/2017  . Hypothyroidism   . Pneumonia X 1   "years and years ago" (03/26/2017)  . Shortness of breath 03/11/2017  . Status post dilation of esophageal narrowing    Past Surgical History:  Procedure Laterality Date  . BILATERAL OOPHORECTOMY Bilateral   . CATARACT EXTRACTION    . CATARACT EXTRACTION W/ INTRAOCULAR LENS  IMPLANT, BILATERAL Bilateral   . CORONARY ANGIOPLASTY WITH STENT PLACEMENT  03/26/2017  . LEFT HEART CATH AND CORONARY ANGIOGRAPHY N/A 03/26/2017   Procedure: Left Heart Cath and Coronary Angiography;  Surgeon: Belva Crome, MD;  Location: [facility];  Service: Cardiovascular;  Laterality: N/A;  . NERVE SURGERY     "lump on my sciatic nerve was removed years ago"  . SHOULDER OPEN ROTATOR CUFF REPAIR Right   . THYROIDECTOMY, PARTIAL      FAMILY HISTORY Family History  Problem Relation Age of Onset  . CAD Mother   . Cataracts Mother   . Coronary artery disease Father   . Leukemia Sister   . Amblyopia Neg Hx    . Blindness Neg Hx   . Diabetes Neg Hx   . Glaucoma Neg Hx   . Macular degeneration Neg Hx   . Retinal detachment Neg Hx   . Strabismus Neg Hx   . Retinitis pigmentosa Neg Hx     SOCIAL HISTORY Social History   Tobacco Use  . Smoking status: Current Some Day Smoker    Packs/day: 0.50    Years: 60.00    Pack years: 30.00    Types: Cigarettes  . Smokeless tobacco: Never Used  Substance Use Topics  . Alcohol use: Yes    Comment: twice a month  . Drug use: No         OPHTHALMIC EXAM:  Base Eye Exam    Visual Acuity (Snellen - Linear)  Right Left   Dist cc 20/CF 20/40+1   Dist ph cc 20?NI 20/NI   Correction:  Glasses       Tonometry (Tonopen, 12:36 PM)      Right Left   Pressure 15 10       Pupils      Dark Light Shape React APD   Right 4 3 Round Sluggish None   Left 4 3 Round Brisk None       Visual Fields      Left Right    Full        Extraocular Movement      Right Left    Full, Ortho Full, Ortho       Neuro/Psych    Oriented x3:  Yes   Mood/Affect:  Normal       Dilation    Both eyes:  1.0% Mydriacyl, 2.5% Phenylephrine @ 12:36 PM        Slit Lamp and Fundus Exam    Slit Lamp Exam      Right Left   Lids/Lashes Dermatochalasis - upper lid, Telangiectasia Dermatochalasis - upper lid, Telangiectasia   Conjunctiva/Sclera White and quiet White and quiet   Cornea Arcus, 2+ diffuse Punctate epithelial erosions, diffuse endo pigment, decreased TBUT, Krukenberg's spindle Arcus, 2-3+ diffuse Punctate epithelial erosions, mild corneal haze centrally, diffuse endo pigment, decreased TBUT, Krukenberg's spindle   Anterior Chamber Deep and quiet Deep and quiet   Iris Round and dilated Round and dilated   Lens Three piece PC IOL in good position, open PC Three piece PC IOL in good position, clear PC   Vitreous Vitreous syneresis, Posterior vitreous detachment Mild Vitreous syneresis, Posterior vitreous detachment       Fundus Exam      Right  Left   Disc sharp rim, 360 Peripapillary atrophy, disc heme at 0900 360 Peripapillary atrophy, trace temporal pallor, sharp rim   C/D Ratio 0.2 0.1   Macula Blunted foveal reflex, Drusen, RPE mottling and clumping, RPE atrophy nasal macula,+PED, +CNVM, sub-retinal Disciform scar, peripapillary heme at 0900 Blunted foveal reflex, Drusen, RPE mottling, clumping, and Atrophy   Vessels Vascular attenuation Vascular attenuation   Periphery Attached Attached        Refraction    Wearing Rx      Sphere Cylinder Axis Add   Right -1.00 +1.50 014 +2.50   Left -1.25 +1.25 174 +2.50          IMAGING AND PROCEDURES  Imaging and Procedures for @TODAY @  OCT, Retina - OU - Both Eyes       Right Eye Quality was good. Central Foveal Thickness: 377. Progression has been stable. Findings include subretinal hyper-reflective material, pigment epithelial detachment, choroidal neovascular membrane, retinal drusen , epiretinal membrane, normal foveal contour, no IRF, no SRF, disciform scar (?interval compaction of SRHM/sub-retinal scar).   Left Eye Quality was good. Central Foveal Thickness: 216. Progression has been stable. Findings include abnormal foveal contour, retinal drusen , no IRF, no SRF, outer retinal atrophy, pigment epithelial detachment.   Notes *Images captured and stored on drive  Diagnosis / Impression:  OD: Exudative AMD -?interval compaction of SRHM/sub-retinal scar -- no IRF/SRF OS: Non-exudative AMD, ? Progression of atrophy  Clinical management:  See below  Abbreviations: NFP - Normal foveal profile. CME - cystoid macular edema. PED - pigment epithelial detachment. IRF - intraretinal fluid. SRF - subretinal fluid. EZ - ellipsoid zone. ERM - epiretinal membrane. ORA - outer retinal atrophy. ORT - outer retinal  tubulation. SRHM - subretinal hyper-reflective material         Intravitreal Injection, Pharmacologic Agent - OD - Right Eye       Time Out 12/24/2018. 1:06 PM.  Confirmed correct patient, procedure, site, and patient consented.   Anesthesia Topical anesthesia was used. Anesthetic medications included Lidocaine 2%, Proparacaine 0.5%.   Procedure Preparation included 5% betadine to ocular surface, eyelid speculum. A 30 gauge needle was used.   Injection:  2 mg aflibercept Alfonse Flavors) SOLN   NDC: A3590391, Lot: [id number], Expiration date: 04/20/2019   Route: Intravitreal, Site: Right Eye, Waste: 0.05 mL  Post-op Post injection exam found visual acuity of at least counting fingers. The patient tolerated the procedure well. There were no complications. The patient received written and verbal post procedure care education.                 ASSESSMENT/PLAN:    ICD-10-CM   1. 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 Intermediate stage nonexudative age-related macular degeneration of left eye H35.3122   3. Retinal edema H35.81 OCT, Retina - OU - Both Eyes  4. Posterior vitreous detachment of both eyes H43.813   5. 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 Pseudophakia of both eyes Z96.1     1. 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 Kathlen Mody  - S/P IVA OD #1 (05.20.19), #2 (07.02.19), #3 (07.30.19)  - S/P IVE OD #1 (08.27.19), #2 (09.24.19), #3 (11.05.19), #4 (12.17.19), #5 (02.21.20)  - OCT with stable SRHM/PED with stable resolution of overlying SRF/heme  - VA remains CF, but pt reports subjective improvement in peripheral vision  - exam shows peripapillary hemorrhage OD -- 0900  - discussed findings and guarded prognosis  - switch in therapy to [facility] approved through [facility] [employer]  - recommend IVE OD #6 today (05.05.20) for maintenance with extension to 3 mos  - pt wishes to be  treated with IVE OD  - RBA of procedure discussed, questions answered  - informed consent obtained and signed  - see procedure note  - Eylea paperwork and benefits investigation started on 07.30.19- Approved for 2020 [employer]  - f/u in 3 months -- DFE/OCT/ possible injection  2. Age related macular degeneration, non-exudative, left eye  - possible progression of atrophy on OCT today  - The incidence, anatomy, and pathology of dry AMD, risk of progression, and the AREDS and AREDS 2 study including smoking risks discussed with patient.  - continue amsler grid monitoring  3. Retinal edema as above  4. PVD / vitreous syneresis OU  Discussed findings and prognosis  No RT or RD on 360 peripheral exam  Reviewed s/s of RT/RD  Strict return precautions for any such RT/RD signs/symptoms   5. Pigmentary glaucoma OU-  - using latanoprost OU qhs - IOP good today - under the expert care of Dr. Quentin Ore - monitor  6. Pseudophakia OU  - s/p CE/IOL OU  - beautiful surgery, doing well  - monitor   Ophthalmic Meds Ordered this visit:  Meds ordered this encounter  Medications  . aflibercept (EYLEA) SOLN 2 mg       Return in about 3 months (around 03/26/2019) for f/u exu ARMD OD, DFE, OCT.  There are no Patient  Instructions on file for this visit.   Explained the diagnoses, plan, and follow up with the patient and they expressed understanding.  Patient expressed understanding of the importance of proper follow up care.   This document serves as a record of services personally performed by Gardiner Sleeper, MD, PhD. It was created on their behalf by Ernest Mallick, OA, an ophthalmic assistant. The creation of this record is the provider's dictation and/or activities during the visit.    Electronically signed by: Ernest Mallick, OA  05.04.2020 1:06 PM     Gardiner Sleeper, M.D., Ph.D. Diseases & Surgery of the Retina and Vitreous [facility]  I have reviewed the  above documentation for accuracy and completeness, and I agree with the above. Gardiner Sleeper, M.D., Ph.D. 12/24/18 1:08 PM     Abbreviations: M myopia (nearsighted); A astigmatism; H hyperopia (farsighted); P presbyopia; Mrx spectacle prescription;  CTL contact lenses; OD right eye; OS left eye; OU both eyes  XT exotropia; ET esotropia; PEK punctate epithelial keratitis; PEE punctate epithelial erosions; DES dry eye syndrome; MGD meibomian gland dysfunction; ATs artificial tears; PFAT's preservative free artificial tears; [place] nuclear sclerotic cataract; PSC posterior subcapsular cataract; ERM epi-retinal membrane; PVD posterior vitreous detachment; RD retinal detachment; DM diabetes mellitus; DR diabetic retinopathy; NPDR non-proliferative diabetic retinopathy; PDR proliferative diabetic retinopathy; CSME clinically significant macular edema; DME diabetic macular edema; dbh dot blot hemorrhages; CWS cotton wool spot; POAG primary open angle glaucoma; C/D cup-to-disc ratio; HVF humphrey visual field; GVF goldmann visual field; OCT optical coherence tomography; IOP intraocular pressure; BRVO Branch retinal vein occlusion; CRVO central retinal vein occlusion; CRAO central retinal artery occlusion; BRAO branch retinal artery occlusion; RT retinal tear; SB scleral buckle; PPV pars plana vitrectomy; VH Vitreous hemorrhage; PRP panretinal laser photocoagulation; IVK intravitreal kenalog; VMT vitreomacular traction; MH Macular hole;  NVD neovascularization of the disc; NVE neovascularization elsewhere; AREDS age related eye disease study; ARMD age related macular degeneration; POAG primary open angle glaucoma; EBMD epithelial/anterior basement membrane dystrophy; ACIOL anterior chamber intraocular lens; IOL intraocular lens; PCIOL posterior chamber intraocular lens; Phaco/IOL phacoemulsification with intraocular lens placement; [place] photorefractive keratectomy; LASIK laser assisted in situ keratomileusis; HTN  hypertension; DM diabetes mellitus; COPD chronic obstructive pulmonary disease

## 2018-12-24 ENCOUNTER — Ambulatory Visit (INDEPENDENT_AMBULATORY_CARE_PROVIDER_SITE_OTHER): Payer: Medicare Other | Admitting: Ophthalmology

## 2018-12-24 ENCOUNTER — Other Ambulatory Visit: Payer: Self-pay

## 2018-12-24 ENCOUNTER — Encounter (INDEPENDENT_AMBULATORY_CARE_PROVIDER_SITE_OTHER): Payer: Self-pay | Admitting: Ophthalmology

## 2018-12-24 DIAGNOSIS — H353122 Nonexudative age-related macular degeneration, left eye, intermediate dry stage: Secondary | ICD-10-CM

## 2018-12-24 DIAGNOSIS — H353211 Exudative age-related macular degeneration, right eye, with active choroidal neovascularization: Secondary | ICD-10-CM

## 2018-12-24 DIAGNOSIS — H3581 Retinal edema: Secondary | ICD-10-CM | POA: Diagnosis not present

## 2018-12-24 DIAGNOSIS — H401331 Pigmentary glaucoma, bilateral, mild stage: Secondary | ICD-10-CM

## 2018-12-24 DIAGNOSIS — H43813 Vitreous degeneration, bilateral: Secondary | ICD-10-CM | POA: Diagnosis not present

## 2018-12-24 DIAGNOSIS — Z961 Presence of intraocular lens: Secondary | ICD-10-CM

## 2018-12-24 MED ORDER — AFLIBERCEPT 2MG/0.05ML IZ SOLN FOR KALEIDOSCOPE
2.0000 mg | INTRAVITREAL | Status: AC | PRN
  Administered 2018-12-24: 2 mg via INTRAVITREAL

## 2019-01-31 ENCOUNTER — Other Ambulatory Visit: Payer: Self-pay | Admitting: Cardiovascular Disease

## 2019-02-19 DIAGNOSIS — E7800 Pure hypercholesterolemia, unspecified: Secondary | ICD-10-CM | POA: Diagnosis not present

## 2019-02-19 DIAGNOSIS — Z5181 Encounter for therapeutic drug level monitoring: Secondary | ICD-10-CM | POA: Diagnosis not present

## 2019-02-19 LAB — COMPREHENSIVE METABOLIC PANEL
ALT: 11 IU/L (ref 0–32)
AST: 27 IU/L (ref 0–40)
Albumin/Globulin Ratio: 1.4 (ref 1.2–2.2)
Albumin: 4 g/dL (ref 3.6–4.6)
Alkaline Phosphatase: 74 IU/L (ref 39–117)
BUN/Creatinine Ratio: 27 (ref 12–28)
BUN: 19 mg/dL (ref 8–27)
Bilirubin Total: 0.3 mg/dL (ref 0.0–1.2)
CO2: 22 mmol/L (ref 20–29)
Calcium: 9.7 mg/dL (ref 8.7–10.3)
Chloride: 101 mmol/L (ref 96–106)
Creatinine, Ser: 0.7 mg/dL (ref 0.57–1.00)
GFR calc Af Amer: 91 mL/min/{1.73_m2} (ref >59)
GFR calc non Af Amer: 79 mL/min/{1.73_m2} (ref >59)
Globulin, Total: 2.8 g/dL (ref 1.5–4.5)
Glucose: 92 mg/dL (ref 65–99)
Potassium: 4.5 mmol/L (ref 3.5–5.2)
Sodium: 140 mmol/L (ref 134–144)
Total Protein: 6.8 g/dL (ref 6.0–8.5)

## 2019-02-19 LAB — LIPID PANEL
Chol/HDL Ratio: 2.8 ratio (ref 0.0–4.4)
Cholesterol, Total: 202 mg/dL — ABNORMAL HIGH (ref 100–199)
HDL: 71 mg/dL (ref >39)
LDL Calculated: 114 mg/dL — ABNORMAL HIGH (ref 0–99)
Triglycerides: 83 mg/dL (ref 0–149)
VLDL Cholesterol Cal: 17 mg/dL (ref 5–40)

## 2019-02-24 DIAGNOSIS — E785 Hyperlipidemia, unspecified: Secondary | ICD-10-CM | POA: Diagnosis not present

## 2019-02-24 DIAGNOSIS — I10 Essential (primary) hypertension: Secondary | ICD-10-CM | POA: Diagnosis not present

## 2019-02-24 DIAGNOSIS — E440 Moderate protein-calorie malnutrition: Secondary | ICD-10-CM | POA: Diagnosis not present

## 2019-02-24 DIAGNOSIS — E039 Hypothyroidism, unspecified: Secondary | ICD-10-CM | POA: Diagnosis not present

## 2019-02-26 ENCOUNTER — Telehealth: Payer: Self-pay | Admitting: Cardiology

## 2019-02-26 NOTE — Telephone Encounter (Signed)

## 2019-02-27 ENCOUNTER — Other Ambulatory Visit: Payer: Self-pay

## 2019-02-27 ENCOUNTER — Ambulatory Visit: Payer: Medicare Other | Admitting: Cardiology

## 2019-02-27 VITALS — BP 134/82 | HR 70 | Temp 98.2°F | Ht 70.0 in | Wt 143.2 lb

## 2019-02-27 DIAGNOSIS — I5032 Chronic diastolic (congestive) heart failure: Secondary | ICD-10-CM

## 2019-02-27 DIAGNOSIS — E785 Hyperlipidemia, unspecified: Secondary | ICD-10-CM

## 2019-02-27 DIAGNOSIS — I2510 Atherosclerotic heart disease of native coronary artery without angina pectoris: Secondary | ICD-10-CM | POA: Diagnosis not present

## 2019-02-27 DIAGNOSIS — R079 Chest pain, unspecified: Secondary | ICD-10-CM

## 2019-02-27 DIAGNOSIS — Z9861 Coronary angioplasty status: Secondary | ICD-10-CM

## 2019-02-27 MED ORDER — NITROGLYCERIN 0.4 MG SL SUBL: 0.4000 mg | SUBLINGUAL_TABLET | SUBLINGUAL | 3 refills | Status: DC | PRN

## 2019-02-27 NOTE — Patient Instructions (Signed)
Medication Instructions:  Your physician recommends that you continue on your current medications as directed. Please refer to the Current Medication list given to you today. If you need a refill on your cardiac medications before your next appointment, please call your pharmacy.   Lab work: None  If you have labs (blood work) drawn today and your tests are completely normal, you will receive your results only by: Marland Kitchen MyChart Message (if you have MyChart) OR . A paper copy in the mail If you have any lab test that is abnormal or we need to change your treatment, we will call you to review the results.  Testing/Procedures: None   Follow-Up: At [facility], you and your health needs are our priority.  As part of our continuing mission to provide you with exceptional heart care, we have created designated Provider Care Teams.  These Care Teams include your primary Cardiologist (physician) and Advanced Practice Providers (APPs -  Physician Assistants and Nurse Practitioners) who all work together to provide you with the care you need, when you need it. You will need a follow up appointment in 6 months.  Please call our office 2 months in advance to schedule this appointment.  You may see Skeet Latch, MD or one of the following Advanced Practice Providers on your designated Care Team:   Kerin Ransom, PA-C Roby Lofts, [place] . Sande Rives, PA-C  Any Other Special Instructions Will Be Listed Below (If Applicable). CALL THE OFFICE IF YOU HAVE CHEST PAIN

## 2019-02-27 NOTE — Assessment & Plan Note (Addendum)
S/P angioplasty with DES 03/26/17 to dLCX after an abnormal Myoview- EF 60%

## 2019-02-27 NOTE — Progress Notes (Signed)
Cardiology Office Note:    Date:  02/27/2019   ID:  Marissa Caldwell, DOB 1932-07-10, MRN [id number]  PCP:  [name], MD  Cardiologist:  Skeet Latch, MD  Electrophysiologist:  None   Referring MD: [name], MD   Chest pain two weeks ago  History of Present Illness:    Marissa Caldwell is a pleasant 85 y.o. female who lives alone in a senior apartment complex with a hx of CAD, status post distal circumflex PCI with DES August 2018 after an abnormal Myoview.  I believe she originally presented with diastolic heart failure in July 2018.  Other medical issues include COPD, hard of hearing, and dyslipidemia.  A couple weeks ago she went out with her friend to get takeout.  It was the first time she had been out of her apartment in a long time.  After they got there taken out she developed sudden chest pain.  She got sweaty with this.  She had her friend drive her home.  She did take 1 nitroglycerin.  She was quite weak when she got to her apartment.  She said when she got in the air conditioning she felt fine and has not had further chest pain.  Her activity is limited, she is self isolating at home secondary to the [place] pandemic.  She has a son that lives 45 minutes away and a granddaughter that lives nearby that help.    She also has noted pain in her lower legs.  She had been taken off Lipitor secondary to LFT elevation.  She was changed to pravastatin 20 mg and tolerated this, it was later increased to 40 mg.  As of July 1 her LDL was 114 and HDL 71.  Her LDL is down from 174 when she was off statin therapy.  Past Medical History:  Diagnosis Date  . Anxiety   . Arthritis   . CAD (coronary artery disease)    a. 03/2017: 95% LCx stenosis --> PCI/DES placed  . Cataract   . Chest pain 03/27/2017  . Chronic diastolic heart failure ([place]) 06/25/2017  . Chronic lower back pain   . COPD (chronic obstructive pulmonary disease) ([place])   . Coronary artery disease   . Diverticulitis    . Diverticulosis   . GERD (gastroesophageal reflux disease)   . Glaucoma   . Heart failure, type unknown ([place]) 03/11/2017  . Hyperlipidemia 03/11/2017  . Hypothyroidism   . Pneumonia X 1   "years and years ago" (03/26/2017)  . Shortness of breath 03/11/2017  . Status post dilation of esophageal narrowing     Past Surgical History:  Procedure Laterality Date  . BILATERAL OOPHORECTOMY Bilateral   . CATARACT EXTRACTION    . CATARACT EXTRACTION W/ INTRAOCULAR LENS  IMPLANT, BILATERAL Bilateral   . CORONARY ANGIOPLASTY WITH STENT PLACEMENT  03/26/2017  . LEFT HEART CATH AND CORONARY ANGIOGRAPHY N/A 03/26/2017   Procedure: Left Heart Cath and Coronary Angiography;  Surgeon: Belva Crome, MD;  Location: [facility];  Service: Cardiovascular;  Laterality: N/A;  . NERVE SURGERY     "lump on my sciatic nerve was removed years ago"  . SHOULDER OPEN ROTATOR CUFF REPAIR Right   . THYROIDECTOMY, PARTIAL      Current Medications: Current Meds  Medication Sig  . albuterol (PROVENTIL HFA;VENTOLIN HFA) 108 (90 Base) MCG/ACT inhaler Inhale 2 puffs into the lungs every 6 (six) hours as needed (for shortness of breath/wheezing.).   Marland Kitchen aspirin 81  MG chewable tablet Chew 1 tablet (81 mg total) by mouth daily.  . budesonide-formoterol (SYMBICORT) 160-4.5 MCG/ACT inhaler Inhale 2 puffs into the lungs 2 (two) times daily.  . Calcium Carb-Cholecalciferol (CALCIUM + D3) 600-200 MG-UNIT TABS Take 1 tablet by mouth daily.  . carvedilol (COREG) 3.125 MG tablet TAKE 1 TABLET BY MOUTH TWICE DAILY  . Cholecalciferol (VITAMIN D3) 2000 units capsule Take 4,000 Units by mouth daily.  . Coenzyme Q10 (COQ10) 100 MG CAPS Take 100 mg by mouth daily.  . dorzolamide (TRUSOPT) 2 % ophthalmic solution Place 1 drop into both eyes 2 (two) times daily.  Marland Kitchen latanoprost (XALATAN) 0.005 % ophthalmic solution Place 1 drop into both eyes at bedtime.  Marland Kitchen levothyroxine (SYNTHROID, LEVOTHROID) 100 MCG tablet TAKE 1 TABLET BY MOUTH  ONCE DAILY ON AN EMPTY STOMACH IN THE MORNING FOR 30 DAYS  . LORazepam (ATIVAN) 0.5 MG tablet Take 0.5 mg by mouth at bedtime.   . LUTEIN PO Take 1 tablet by mouth daily.  . mirtazapine (REMERON) 15 MG tablet TAKE 1 TABLET BY MOUTH AT BEDTIME ONCE A DAY  . Multiple Vitamin (MULTIVITAMIN) tablet Take 1 tablet by mouth daily.  . nitroGLYCERIN (NITROSTAT) 0.4 MG SL tablet Place 1 tablet (0.4 mg total) under the tongue every 5 (five) minutes as needed for chest pain.  . Omega-3 Fatty Acids (FISH OIL PO) Take 5 mLs by mouth daily.  . pravastatin (PRAVACHOL) 40 MG tablet TAKE 1 TABLET BY MOUTH DAILY. GENERIC EQUIVALENT FOR PRAVACHOL. NEW DOSE, DISCONTINUE PREVIOUS PRESCRIPTION  . sertraline (ZOLOFT) 50 MG tablet Take 50 mg by mouth daily as needed (for anxiety.).   Marland Kitchen TOVIAZ 4 MG TB24 tablet TK 1 T PO  ONCE A DAY  . [DISCONTINUED] nitroGLYCERIN (NITROSTAT) 0.4 MG SL tablet Place 1 tablet (0.4 mg total) under the tongue every 5 (five) minutes as needed for chest pain.   Current Facility-Administered Medications for the 02/27/19 encounter (Office Visit) with Erlene Quan, PA-C  Medication  . aflibercept (EYLEA) SOLN 2 mg  . aflibercept (EYLEA) SOLN 2 mg  . aflibercept (EYLEA) SOLN 2 mg  . aflibercept (EYLEA) SOLN 2 mg  . aflibercept (EYLEA) SOLN 2 mg  . Bevacizumab (AVASTIN) SOLN 1.25 mg  . Bevacizumab (AVASTIN) SOLN 1.25 mg  . Bevacizumab (AVASTIN) SOLN 1.25 mg     Allergies:   Lipitor [atorvastatin]   Social History   Socioeconomic History  . Marital status: Widowed    Spouse name: Not on file  . Number of children: 1  . Years of education: Not on file  . Highest education level: Not on file  Occupational History  . Occupation: retired  Scientific laboratory technician  . Financial resource strain: Not on file  . Food insecurity    Worry: Not on file    Inability: Not on file  . Transportation needs    Medical: Not on file    Non-medical: Not on file  Tobacco Use  . Smoking status: Current Some Day  Smoker    Packs/day: 0.50    Years: 60.00    Pack years: 30.00    Types: Cigarettes  . Smokeless tobacco: Never Used  Substance and Sexual Activity  . Alcohol use: Yes    Comment: twice a month  . Drug use: No  . Sexual activity: Never  Lifestyle  . Physical activity    Days per week: Not on file    Minutes per session: Not on file  . Stress: Not on file  Relationships  . Social Herbalist on phone: Not on file    Gets together: Not on file    Attends religious service: Not on file    Active member of club or organization: Not on file    Attends meetings of clubs or organizations: Not on file    Relationship status: Not on file  Other Topics Concern  . Not on file  Social History Narrative  . Not on file     Family History: The patient's family history includes CAD in her mother; Cataracts in her mother; Coronary artery disease in her father; Leukemia in her sister. There is no history of Amblyopia, Blindness, Diabetes, Glaucoma, Macular degeneration, Retinal detachment, Strabismus, or Retinitis pigmentosa.  ROS:   Please see the history of present illness.     All other systems reviewed and are negative.  EKGs/Labs/Other Studies Reviewed:    The following studies were reviewed today: Myoview July 2018 Cath/PCI Aug 2018  EKG:  EKG is ordered today.  The ekg ordered today demonstrates NSR-70  Recent Labs: 02/19/2019: ALT 11; BUN 19; Creatinine, Ser 0.70; Potassium 4.5; Sodium 140  Recent Lipid Panel    Component Value Date/Time   CHOL 202 (H) 02/19/2019 1115   TRIG 83 02/19/2019 1115   HDL 71 02/19/2019 1115   CHOLHDL 2.8 02/19/2019 1115   LDLCALC 114 (H) 02/19/2019 1115    Physical Exam:    VS:  BP 134/82   Pulse 70   Temp 98.2 F (36.8 C)   Ht 5\' 10"  (1.778 m)   Wt 143 lb 3.2 oz (65 kg)   BMI 20.55 kg/m     Wt Readings from Last 3 Encounters:  02/27/19 143 lb 3.2 oz (65 kg)  04/11/18 144 lb 12.8 oz (65.7 kg)  12/31/17 151 lb 6 oz (68.7  kg)     GEN: Elderly, thin female, well developed in no acute distress HEENT: Normal NECK: No JVD; No carotid bruits LYMPHATICS: No lymphadenopathy CARDIAC: RRR, no murmurs, rubs, gallops RESPIRATORY:  Decreased breath sounds, kyphosis ABDOMEN: Soft, non-tender, non-distended MUSCULOSKELETAL:  No edema; distal pulses 2+/4 NEUROLOGIC:  Alert and oriented x 3 PSYCHIATRIC:  Normal affect   ASSESSMENT:    CAD S/P percutaneous coronary angioplasty S/P angioplasty with DES 03/26/17 to dLCX after an abnormal Myoview- EF 60%  Chest pain The episode two weeks ago is concerning though she has not had recurrent symptoms and did not have chest pain pre PCI (SOB).  I offered a repeat Myoview but for now she wants to wait.  She feels it was because of the heat.  If she has recurrent chest pain she will need further evaluation.   Dyslipidemia Elevated LFTs on Lipitor-changed to Pravastatin, LDL 114 on Pravastatin 40 mg (down from 174 when off statin Rx)  with normal LFTs. July 2020.  She is having some bilateral lower extremity pain which could be secondary to Pravachol.  Her LDL is better but still not close to goal. I discussed considering a PCSK9 agent and she wants to pursue this.  I'll contact pharmacy to get their input- cost may be an issue.   PLAN:    Refill NTG Rx.  If she has another episode of chest pain she will need a Myoview.  I'll ask pharmacy to review possibility of PCSK9 agent.  I'm reluctant to recommend increasing her statin Rx with her complaints of leg pain.  F/U Dr Oval Linsey in 6 months.    Medication Adjustments/Labs  and Tests Ordered: Current medicines are reviewed at length with the patient today.  Concerns regarding medicines are outlined above.  Orders Placed This Encounter  Procedures  . EKG 12-Lead   Meds ordered this encounter  Medications  . nitroGLYCERIN (NITROSTAT) 0.4 MG SL tablet    Sig: Place 1 tablet (0.4 mg total) under the tongue every 5 (five) minutes as  needed for chest pain.    Dispense:  25 tablet    Refill:  3    Patient Instructions  Medication Instructions:  Your physician recommends that you continue on your current medications as directed. Please refer to the Current Medication list given to you today. If you need a refill on your cardiac medications before your next appointment, please call your pharmacy.   Lab work: None  If you have labs (blood work) drawn today and your tests are completely normal, you will receive your results only by: Marland Kitchen MyChart Message (if you have MyChart) OR . A paper copy in the mail If you have any lab test that is abnormal or we need to change your treatment, we will call you to review the results.  Testing/Procedures: None   Follow-Up: At [facility], you and your health needs are our priority.  As part of our continuing mission to provide you with exceptional heart care, we have created designated Provider Care Teams.  These Care Teams include your primary Cardiologist (physician) and Advanced Practice Providers (APPs -  Physician Assistants and Nurse Practitioners) who all work together to provide you with the care you need, when you need it. You will need a follow up appointment in 6 months.  Please call our office 2 months in advance to schedule this appointment.  You may see Skeet Latch, MD or one of the following Advanced Practice Providers on your designated Care Team:   Kerin Ransom, PA-C Roby Lofts, [place] . Sande Rives, PA-C  Any Other Special Instructions Will Be Listed Below (If Applicable). CALL THE OFFICE IF YOU HAVE CHEST PAIN     Signed, Kerin Ransom, PA-C  02/27/2019 10:59 AM    [place]

## 2019-02-27 NOTE — Assessment & Plan Note (Signed)
Elevated LFTs on Lipitor-changed to Pravastatin, LDL 114 on Pravastatin 40 mg (down from 174 when off statin Rx)  with normal LFTs. July 2020.  She is having some bilateral lower extremity pain which could be secondary to Pravachol.  Her LDL is better but still not close to goal. I discussed considering a PCSK9 agent and she wants to pursue this.  I'll contact pharmacy to get their input- cost may be an issue.

## 2019-02-27 NOTE — Assessment & Plan Note (Signed)
The episode two weeks ago is concerning though she has not had recurrent symptoms and did not have chest pain pre PCI (SOB).  I offered a repeat Myoview but for now she wants to wait.  She feels it was because of the heat.  If she has recurrent chest pain she will need further evaluation.

## 2019-03-07 ENCOUNTER — Telehealth: Payer: Self-pay | Admitting: *Deleted

## 2019-03-07 NOTE — Telephone Encounter (Signed)
Left message for patient to call and schedule LIPID consult with [facility] per 03/16/19 staff message

## 2019-03-10 DIAGNOSIS — H353122 Nonexudative age-related macular degeneration, left eye, intermediate dry stage: Secondary | ICD-10-CM | POA: Diagnosis not present

## 2019-03-10 DIAGNOSIS — H353211 Exudative age-related macular degeneration, right eye, with active choroidal neovascularization: Secondary | ICD-10-CM | POA: Diagnosis not present

## 2019-03-10 DIAGNOSIS — H401331 Pigmentary glaucoma, bilateral, mild stage: Secondary | ICD-10-CM | POA: Diagnosis not present

## 2019-03-11 NOTE — Telephone Encounter (Signed)
Patient states she will have to talk with her children to see when they can bring her to the office and she will call back to schedule Lipid consult in August with Pharm D

## 2019-03-12 ENCOUNTER — Encounter: Payer: Self-pay | Admitting: *Deleted

## 2019-03-12 DIAGNOSIS — Z006 Encounter for examination for normal comparison and control in clinical research program: Secondary | ICD-10-CM

## 2019-03-12 NOTE — Research (Signed)
[employer] Research study 2 year telephone follow up completed. Patient denies any Serious adverse events, no hospitalizations or changes in medication. She stated that her wrist fracture resolved about 4 week later, once the casts were removed (aropund 06/25/2018). Her macular degeneration has not improved. She is asymptomatic with cardiac symptoms today. She did experience Chest pain a few weeks ago due to the heat. I thanked her for her continued participation. Next research required telephone follow up is due 1 year from now.

## 2019-03-19 DIAGNOSIS — N6489 Other specified disorders of breast: Secondary | ICD-10-CM | POA: Diagnosis not present

## 2019-03-19 DIAGNOSIS — R590 Localized enlarged lymph nodes: Secondary | ICD-10-CM | POA: Diagnosis not present

## 2019-03-19 DIAGNOSIS — N6311 Unspecified lump in the right breast, upper outer quadrant: Secondary | ICD-10-CM | POA: Diagnosis not present

## 2019-03-25 NOTE — Progress Notes (Signed)
[facility] Note  03/26/2019     CHIEF COMPLAINT Patient presents for Retina Follow Up   HISTORY OF PRESENT ILLNESS: Marissa Caldwell is a 85 y.o. female who presents to the clinic today for:   HPI    Retina Follow Up    Patient presents with  Wet AMD.  In right eye.  This started weeks ago.  Severity is moderate.  Duration of weeks.  Since onset it is stable.  I, the attending physician,  performed the HPI with the patient and updated documentation appropriately.          Comments    Patient states her vision is stable In both eyes.  She denies eye pain or discomfort.  Patient complains of possible floaters OD.  She states she doesn't know if its a floater or an eyelash in the way.       Last edited by Bernarda Caffey, MD on 03/26/2019  2:02 PM. (History)    pt states she can see colors peripherally really well, but she still has a spot in her central vision that she cannot see thru, pt states Dr. Kathlen Mody put her on a pressure drop for her left eye only, she states it starts with a B  Referring physician: [name], MD [place],  [place] [id number]  HISTORICAL INFORMATION:   Selected notes from the MEDICAL RECORD NUMBER Referred by Dr. Quentin Ore for concern of exudative ARMD OD LEE: 05.16.19 (C.[place]) [BCVA: OD: 20/60 OS: 20/70] Ocular Hx-Psedudophakia OU, Glaucoma suspect, non-exudative ARMD OS, Yag PMH-HTN, Asthma    CURRENT MEDICATIONS: Current Outpatient Medications (Ophthalmic Drugs)  Medication Sig  . dorzolamide (TRUSOPT) 2 % ophthalmic solution Place 1 drop into both eyes 2 (two) times daily.  Marland Kitchen latanoprost (XALATAN) 0.005 % ophthalmic solution Place 1 drop into both eyes at bedtime.   Current Facility-Administered Medications (Ophthalmic Drugs)  Medication Route  . aflibercept (EYLEA) SOLN 2 mg Intravitreal  . aflibercept (EYLEA) SOLN 2 mg Intravitreal  . aflibercept (EYLEA) SOLN 2 mg Intravitreal  .  aflibercept (EYLEA) SOLN 2 mg Intravitreal  . aflibercept (EYLEA) SOLN 2 mg Intravitreal   Current Outpatient Medications (Other)  Medication Sig  . albuterol (PROVENTIL HFA;VENTOLIN HFA) 108 (90 Base) MCG/ACT inhaler Inhale 2 puffs into the lungs every 6 (six) hours as needed (for shortness of breath/wheezing.).   Marland Kitchen aspirin 81 MG chewable tablet Chew 1 tablet (81 mg total) by mouth daily.  . budesonide-formoterol (SYMBICORT) 160-4.5 MCG/ACT inhaler Inhale 2 puffs into the lungs 2 (two) times daily.  . Calcium Carb-Cholecalciferol (CALCIUM + D3) 600-200 MG-UNIT TABS Take 1 tablet by mouth daily.  . carvedilol (COREG) 3.125 MG tablet TAKE 1 TABLET BY MOUTH TWICE DAILY  . Cholecalciferol (VITAMIN D3) 2000 units capsule Take 4,000 Units by mouth daily.  . Coenzyme Q10 (COQ10) 100 MG CAPS Take 100 mg by mouth daily.  Marland Kitchen levothyroxine (SYNTHROID, LEVOTHROID) 100 MCG tablet TAKE 1 TABLET BY MOUTH ONCE DAILY ON AN EMPTY STOMACH IN THE MORNING FOR 30 DAYS  . LORazepam (ATIVAN) 0.5 MG tablet Take 0.5 mg by mouth at bedtime.   . LUTEIN PO Take 1 tablet by mouth daily.  . mirtazapine (REMERON) 15 MG tablet TAKE 1 TABLET BY MOUTH AT BEDTIME ONCE A DAY  . Multiple Vitamin (MULTIVITAMIN) tablet Take 1 tablet by mouth daily.  . nitroGLYCERIN (NITROSTAT) 0.4 MG SL tablet Place 1 tablet (0.4 mg total) under the tongue  every 5 (five) minutes as needed for chest pain.  . Omega-3 Fatty Acids (FISH OIL PO) Take 5 mLs by mouth daily.  Marland Kitchen oxyCODONE-acetaminophen (PERCOCET/ROXICET) 5-325 MG tablet Take 1-2 tablets by mouth every 6 (six) hours as needed for severe pain. (Patient not taking: Reported on 02/27/2019)  . pravastatin (PRAVACHOL) 40 MG tablet TAKE 1 TABLET BY MOUTH DAILY. GENERIC EQUIVALENT FOR PRAVACHOL. NEW DOSE, DISCONTINUE PREVIOUS PRESCRIPTION  . sertraline (ZOLOFT) 50 MG tablet Take 50 mg by mouth daily as needed (for anxiety.).   Marland Kitchen TOVIAZ 4 MG TB24 tablet TK 1 T PO  ONCE A DAY   Current  Facility-Administered Medications (Other)  Medication Route  . Bevacizumab (AVASTIN) SOLN 1.25 mg Intravitreal  . Bevacizumab (AVASTIN) SOLN 1.25 mg Intravitreal  . Bevacizumab (AVASTIN) SOLN 1.25 mg Intravitreal      REVIEW OF SYSTEMS: ROS    Positive for: Eyes   Negative for: Constitutional, Gastrointestinal, Neurological, Skin, Genitourinary, Musculoskeletal, HENT, Endocrine, Cardiovascular, Respiratory, Psychiatric, Allergic/Imm, Heme/Lymph   Last edited by Doneen Poisson on 03/26/2019  1:54 PM. (History)       ALLERGIES Allergies  Allergen Reactions  . Lipitor [Atorvastatin]     Elevated LFT's    PAST MEDICAL HISTORY Past Medical History:  Diagnosis Date  . Anxiety   . Arthritis   . CAD (coronary artery disease)    a. 03/2017: 95% LCx stenosis --> PCI/DES placed  . Cataract   . Chest pain 03/27/2017  . Chronic diastolic heart failure ([place]) 06/25/2017  . Chronic lower back pain   . COPD (chronic obstructive pulmonary disease) ([place])   . Coronary artery disease   . Diverticulitis   . Diverticulosis   . GERD (gastroesophageal reflux disease)   . Glaucoma   . Heart failure, type unknown ([place]) 03/11/2017  . Hyperlipidemia 03/11/2017  . Hypothyroidism   . Pneumonia X 1   "years and years ago" (03/26/2017)  . Shortness of breath 03/11/2017  . Status post dilation of esophageal narrowing    Past Surgical History:  Procedure Laterality Date  . BILATERAL OOPHORECTOMY Bilateral   . CATARACT EXTRACTION    . CATARACT EXTRACTION W/ INTRAOCULAR LENS  IMPLANT, BILATERAL Bilateral   . CORONARY ANGIOPLASTY WITH STENT PLACEMENT  03/26/2017  . LEFT HEART CATH AND CORONARY ANGIOGRAPHY N/A 03/26/2017   Procedure: Left Heart Cath and Coronary Angiography;  Surgeon: Belva Crome, MD;  Location: [facility];  Service: Cardiovascular;  Laterality: N/A;  . NERVE SURGERY     "lump on my sciatic nerve was removed years ago"  . SHOULDER OPEN ROTATOR CUFF REPAIR Right   .  THYROIDECTOMY, PARTIAL      FAMILY HISTORY Family History  Problem Relation Age of Onset  . CAD Mother   . Cataracts Mother   . Coronary artery disease Father   . Leukemia Sister   . Amblyopia Neg Hx   . Blindness Neg Hx   . Diabetes Neg Hx   . Glaucoma Neg Hx   . Macular degeneration Neg Hx   . Retinal detachment Neg Hx   . Strabismus Neg Hx   . Retinitis pigmentosa Neg Hx     SOCIAL HISTORY Social History   Tobacco Use  . Smoking status: Current Some Day Smoker    Packs/day: 0.50    Years: 60.00    Pack years: 30.00    Types: Cigarettes  . Smokeless tobacco: Never Used  Substance Use Topics  . Alcohol use: Yes  Comment: twice a month  . Drug use: No         OPHTHALMIC EXAM:  Base Eye Exam    Visual Acuity (Snellen - Linear)      Right Left   Dist cc CF @ 3' 20/40 -1   Dist ph cc NI NI   Correction: Glasses       Tonometry (Tonopen, 1:58 PM)      Right Left   Pressure 13 13       Pupils      Dark Light Shape React APD   Right 3 2 Round Minimal 0   Left 3 2 Round Minimal 0       Visual Fields      Left Right    Full Full       Extraocular Movement      Right Left    Full Full       Neuro/Psych    Oriented x3: Yes   Mood/Affect: Normal       Dilation    Both eyes: 1.0% Mydriacyl, 2.5% Phenylephrine @ 1:58 PM        Slit Lamp and Fundus Exam    Slit Lamp Exam      Right Left   Lids/Lashes Dermatochalasis - upper lid, Telangiectasia Dermatochalasis - upper lid, Telangiectasia   Conjunctiva/Sclera White and quiet White and quiet   Cornea Arcus, 2+ diffuse Punctate epithelial erosions, diffuse endo pigment, decreased TBUT, Krukenberg's spindle Arcus, 2-3+ diffuse Punctate epithelial erosions, mild corneal haze centrally, diffuse endo pigment, decreased TBUT, Krukenberg's spindle   Anterior Chamber Deep and quiet Deep and quiet   Iris Round and dilated Round and dilated   Lens Three piece PC IOL in good position, open PC Three piece  PC IOL in good position, clear PC   Vitreous Vitreous syneresis, Posterior vitreous detachment, mild vitreous condensations Mild Vitreous syneresis, Posterior vitreous detachment       Fundus Exam      Right Left   Disc sharp rim, 360 Peripapillary atrophy, disc heme at 0900 360 Peripapillary atrophy, trace temporal pallor, sharp rim   C/D Ratio 0.2 0.1   Macula Blunted foveal reflex, Drusen, RPE mottling and clumping, +PED, +CNVM, sub-retinal Disciform scar, new peripapillary sub-retinal heme at 0900 Blunted foveal reflex, Drusen, RPE mottling, clumping, and Atrophy, focal GA nasal macula   Vessels Vascular attenuation Vascular attenuation   Periphery Attached Attached        Refraction    Wearing Rx      Sphere Cylinder Axis Add   Right -1.00 +1.50 014 +2.50   Left -1.25 +1.25 174 +2.50          IMAGING AND PROCEDURES  Imaging and Procedures for @TODAY @  OCT, Retina - OU - Both Eyes       Right Eye Quality was good. Central Foveal Thickness: 359. Progression has worsened. Findings include subretinal hyper-reflective material, pigment epithelial detachment, choroidal neovascular membrane, retinal drusen , epiretinal membrane, disciform scar, subretinal fluid, intraretinal fluid, abnormal foveal contour (Interval increase in IRF/SRF nasal macula; interval increase in PED/SRHM).   Left Eye Quality was good. Central Foveal Thickness: 249. Progression has been stable. Findings include abnormal foveal contour, retinal drusen , no IRF, no SRF, outer retinal atrophy, pigment epithelial detachment.   Notes *Images captured and stored on drive  Diagnosis / Impression:  OD: Exudative AMD - Interval increase in IRF/SRF nasal peripapillary macula; interval increase in PED/SRHM OS: Non-exudative AMD, ?Progression of atrophy  Clinical  management:  See below  Abbreviations: NFP - Normal foveal profile. CME - cystoid macular edema. PED - pigment epithelial detachment. IRF -  intraretinal fluid. SRF - subretinal fluid. EZ - ellipsoid zone. ERM - epiretinal membrane. ORA - outer retinal atrophy. ORT - outer retinal tubulation. SRHM - subretinal hyper-reflective material         Intravitreal Injection, Pharmacologic Agent - OD - Right Eye       Time Out 03/26/2019. 2:40 PM. Confirmed correct patient, procedure, site, and patient consented.   Anesthesia Topical anesthesia was used. Anesthetic medications included Lidocaine 2%, Proparacaine 0.5%.   Procedure Preparation included 5% betadine to ocular surface. A 30 gauge needle was used.   Injection:  2 mg aflibercept Alfonse Flavors) SOLN   NDC: A3590391, Lot: [id number], Expiration date: 11/08/2019   Route: Intravitreal, Site: Right Eye, Waste: 0.05 mL  Post-op Post injection exam found visual acuity of at least counting fingers. The patient tolerated the procedure well. There were no complications. The patient received written and verbal post procedure care education.                 ASSESSMENT/PLAN:    ICD-10-CM   1. 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 Retinal edema  H35.81 OCT, Retina - OU - Both Eyes  4. Posterior vitreous detachment of both eyes  H43.813   5. Pigmentary glaucoma of both eyes, mild stage  H40.1331   6. Pseudophakia of both eyes  Z96.1     1. 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 Kathlen Mody  - S/P IVA OD #1 (05.20.19), #2 (07.02.19), #3 (07.30.19)  - S/P IVE OD #1 (08.27.19), #2 (09.24.19), #3 (11.05.19), #4 (12.17.19), #5 (02.21.20), #6 (05.05.20)  - OCT with Interval increase in IRF/SRF nasal macula; interval increase in PED/SRHM  - VA  remains CF, but pt reports subjective improvement in peripheral vision  - exam shows new, large, sub-retinal peripapillary hemorrhage OD -- 0900  - discussed findings and guarded prognosis  - switch in therapy to [facility] approved through [employer]  - recommend IVE OD #7 today (08.05.20) w/ f/u in 4 wks  - pt wishes to be treated with IVE OD  - RBA of procedure discussed, questions answered  - informed consent obtained and signed  - see procedure note  - Eylea paperwork and benefits investigation started on 07.30.19- Approved for 2020 [employer]  - f/u in 4 weeks -- DFE/OCT/ possible injection  2. Age related macular degeneration, non-exudative, left eye  - possible progression of atrophy on OCT today  - The incidence, anatomy, and pathology of dry AMD, risk of progression, and the AREDS and AREDS 2 study including smoking risks discussed with patient.  - continue amsler grid monitoring  3. Retinal edema as above  4. PVD / vitreous syneresis OU  Discussed findings and prognosis  No RT or RD on 360 peripheral exam  Reviewed s/s of RT/RD  Strict return precautions for any such RT/RD signs/symptoms   5. Pigmentary glaucoma OU-   - using latanoprost OU qhs  - IOP good today  - under the expert care of Dr. Quentin Ore  - monitor  6. Pseudophakia OU  -  s/p CE/IOL OU  - beautiful surgery, doing well  - monitor   Ophthalmic Meds Ordered this visit:  Meds ordered this encounter  Medications  . aflibercept (EYLEA) SOLN 2 mg       Return in about 4 weeks (around 04/23/2019) for f/u exu ARMD OD, DFE, OCT.  There are no Patient Instructions on file for this visit.   Explained the diagnoses, plan, and follow up with the patient and they expressed understanding.  Patient expressed understanding of the importance of proper follow up care.   This document serves as a record of services personally performed by Gardiner Sleeper, MD, PhD. It was created on their behalf by Ernest Mallick, OA,  an ophthalmic assistant. The creation of this record is the provider's dictation and/or activities during the visit.    Electronically signed by: Ernest Mallick, OA  08.04.2020 8:57 PM    Gardiner Sleeper, M.D., Ph.D. Diseases & Surgery of the Retina and Vitreous [facility]  I have reviewed the above documentation for accuracy and completeness, and I agree with the above. Gardiner Sleeper, M.D., Ph.D. 03/26/19 8:57 PM      Abbreviations: M myopia (nearsighted); A astigmatism; H hyperopia (farsighted); P presbyopia; Mrx spectacle prescription;  CTL contact lenses; OD right eye; OS left eye; OU both eyes  XT exotropia; ET esotropia; PEK punctate epithelial keratitis; PEE punctate epithelial erosions; DES dry eye syndrome; MGD meibomian gland dysfunction; ATs artificial tears; PFAT's preservative free artificial tears; [place] nuclear sclerotic cataract; PSC posterior subcapsular cataract; ERM epi-retinal membrane; PVD posterior vitreous detachment; RD retinal detachment; DM diabetes mellitus; DR diabetic retinopathy; NPDR non-proliferative diabetic retinopathy; PDR proliferative diabetic retinopathy; CSME clinically significant macular edema; DME diabetic macular edema; dbh dot blot hemorrhages; CWS cotton wool spot; POAG primary open angle glaucoma; C/D cup-to-disc ratio; HVF humphrey visual field; GVF goldmann visual field; OCT optical coherence tomography; IOP intraocular pressure; BRVO Branch retinal vein occlusion; CRVO central retinal vein occlusion; CRAO central retinal artery occlusion; BRAO branch retinal artery occlusion; RT retinal tear; SB scleral buckle; PPV pars plana vitrectomy; VH Vitreous hemorrhage; PRP panretinal laser photocoagulation; IVK intravitreal kenalog; VMT vitreomacular traction; MH Macular hole;  NVD neovascularization of the disc; NVE neovascularization elsewhere; AREDS age related eye disease study; ARMD age related macular degeneration; POAG primary open  angle glaucoma; EBMD epithelial/anterior basement membrane dystrophy; ACIOL anterior chamber intraocular lens; IOL intraocular lens; PCIOL posterior chamber intraocular lens; Phaco/IOL phacoemulsification with intraocular lens placement; [place] photorefractive keratectomy; LASIK laser assisted in situ keratomileusis; HTN hypertension; DM diabetes mellitus; COPD chronic obstructive pulmonary disease

## 2019-03-26 ENCOUNTER — Encounter (INDEPENDENT_AMBULATORY_CARE_PROVIDER_SITE_OTHER): Payer: Self-pay | Admitting: Ophthalmology

## 2019-03-26 ENCOUNTER — Other Ambulatory Visit: Payer: Self-pay

## 2019-03-26 ENCOUNTER — Ambulatory Visit (INDEPENDENT_AMBULATORY_CARE_PROVIDER_SITE_OTHER): Payer: Medicare Other | Admitting: Ophthalmology

## 2019-03-26 DIAGNOSIS — H353211 Exudative age-related macular degeneration, right eye, with active choroidal neovascularization: Secondary | ICD-10-CM | POA: Diagnosis not present

## 2019-03-26 DIAGNOSIS — H3581 Retinal edema: Secondary | ICD-10-CM

## 2019-03-26 DIAGNOSIS — H401331 Pigmentary glaucoma, bilateral, mild stage: Secondary | ICD-10-CM

## 2019-03-26 DIAGNOSIS — H353122 Nonexudative age-related macular degeneration, left eye, intermediate dry stage: Secondary | ICD-10-CM | POA: Diagnosis not present

## 2019-03-26 DIAGNOSIS — H43813 Vitreous degeneration, bilateral: Secondary | ICD-10-CM

## 2019-03-26 DIAGNOSIS — Z961 Presence of intraocular lens: Secondary | ICD-10-CM

## 2019-03-26 MED ORDER — AFLIBERCEPT 2MG/0.05ML IZ SOLN FOR KALEIDOSCOPE
2.0000 mg | INTRAVITREAL | Status: AC | PRN
  Administered 2019-03-26: 2 mg via INTRAVITREAL

## 2019-03-31 ENCOUNTER — Other Ambulatory Visit: Payer: Self-pay | Admitting: Radiology

## 2019-03-31 DIAGNOSIS — Z170 Estrogen receptor positive status [ER+]: Secondary | ICD-10-CM | POA: Diagnosis not present

## 2019-03-31 DIAGNOSIS — R599 Enlarged lymph nodes, unspecified: Secondary | ICD-10-CM | POA: Diagnosis not present

## 2019-03-31 DIAGNOSIS — C50411 Malignant neoplasm of upper-outer quadrant of right female breast: Secondary | ICD-10-CM | POA: Diagnosis not present

## 2019-03-31 DIAGNOSIS — N6311 Unspecified lump in the right breast, upper outer quadrant: Secondary | ICD-10-CM | POA: Diagnosis not present

## 2019-04-01 NOTE — Telephone Encounter (Signed)
Patient has not called back to schedule

## 2019-04-02 ENCOUNTER — Telehealth: Payer: Self-pay | Admitting: Hematology

## 2019-04-02 NOTE — Telephone Encounter (Signed)
Spoke with patient to confirm afternoon [facility] appointment for 8/19, letter will be mailed to patient

## 2019-04-03 ENCOUNTER — Encounter: Payer: Self-pay | Admitting: *Deleted

## 2019-04-03 ENCOUNTER — Other Ambulatory Visit: Payer: Self-pay | Admitting: *Deleted

## 2019-04-03 DIAGNOSIS — C50411 Malignant neoplasm of upper-outer quadrant of right female breast: Secondary | ICD-10-CM

## 2019-04-03 DIAGNOSIS — Z170 Estrogen receptor positive status [ER+]: Secondary | ICD-10-CM

## 2019-04-08 NOTE — Progress Notes (Signed)
[name]   [phone]) [phone] Fax:([phone]) [place] Note   Patient Care Team: [name], MD as PCP - General Skeet Latch, MD as PCP - Cardiology (Cardiology) Rockwell Germany, RN as Oncology Nurse Navigator Mauro Kaufmann, RN as Oncology Nurse Navigator Jovita Kussmaul, MD as Consulting Physician (General Surgery) [name], MD as Consulting Physician (Hematology) Eppie Gibson, MD as Attending Physician (Radiation Oncology)  Date of Service:  04/09/2019   CHIEF COMPLAINTS/PURPOSE OF CONSULTATION:  Newly diagnosed Malignant neoplasm of upper-outer quadrant of right breast    Oncology History Overview Note  Cancer Staging Malignant neoplasm of upper-outer quadrant of right breast in female, estrogen receptor positive ([place]) Staging form: Breast, AJCC 8th Edition - Clinical: Stage IIA (cT1c, cN1, cM0, G3, ER+, PR+, HER2-) - Signed by [name], MD on 04/09/2019    Malignant neoplasm of upper-outer quadrant of right breast in female, estrogen receptor positive ([place])  03/19/2019 Mammogram   Diagnostic Mammogram 03/19/19  IMPRESSION The 2 cm distortion in the  right breast 9:30 position middle depth 3cm from the nipple is indeterminate. [place] recommended.  There is also a 1.2cmx3.2cm enlarged right axillary LN highly suggestive of malignancy.     03/31/2019 Initial Biopsy   Diagnosis 03/31/19  1. Breast, right, needle core biopsy, 9:30 o'clock, mid depth, 3cmfn - INVASIVE DUCTAL CARCINOMA. - LYMPHOVASCULAR INVASION IS IDENTIFIED. - SEE COMMENT. 2. Lymph node, needle/core biopsy, right axilla - INVASIVE DUCTAL CARCINOMA. - SEE COMMENT.   03/31/2019 Receptors her2   1. PROGNOSTIC INDICATORS Results: IMMUNOHISTOCHEMICAL AND MORPHOMETRIC ANALYSIS PERFORMED MANUALLY The tumor cells are NEGATIVE for Her2 (0). Estrogen Receptor: 100%, POSITIVE, STRONG STAINING INTENSITY Progesterone Receptor: 10%, POSITIVE, STRONG STAINING INTENSITY  Proliferation Marker Ki67: 10%  2. PROGNOSTIC INDICATORS Results: IMMUNOHISTOCHEMICAL AND MORPHOMETRIC ANALYSIS PERFORMED MANUALLY Estrogen Receptor: 100%, POSITIVE, STRONG STAINING INTENSITY Progesterone Receptor: 0%, NEGATIVE Proliferation Marker Ki67: 40%   04/03/2019 Initial Diagnosis   Malignant neoplasm of upper-outer quadrant of right breast in female, estrogen receptor positive ([place])   04/09/2019 Cancer Staging   Staging form: Breast, AJCC 8th Edition - Clinical: Stage IIA (cT1c, cN1, cM0, G3, ER+, PR+, HER2-) - Signed by [name], MD on 04/09/2019  Staging form: Breast, AJCC 8th Edition - Clinical: Stage IIA (cT1c, cN1, cM0, G3, ER+, PR+, HER2-) - Signed by [name], MD on 04/09/2019      HISTORY OF PRESENTING ILLNESS:  Marissa Caldwell 85 y.o. female is a here because of newly diagnosed right breast cancer. The patient presents to the Breast clinic today with her son.   She felt the mass of her right breast more than 3 months ago. This mass does not hurt until after her biopsy. She denies nay other breast, nipple change.   Today the patient notes right posterior shoulder aches. She notes her energy level is only declining from her age, not related to her cancer. She did lose weight over the last year due to drastic diet change and she did not like what she was eating. She has gained some weight back. She is on Mirtazapine for her appetite and to help her sleep. She notes having recurrent benign right breast lumps after she started menopause until her late 48s. Her overall menopause was tolerable.   Socially she lives alone in a first floor 1 bedroom apartment. She has one son. She is able to take care of herself. She does housework and does ADL independently. She smokes but not often.  She drinks 1 beer a night.  They have a PMHx of she had CAD and had stent placed. She has HTN, but level are normal. She has arthritis, not on pain meds, mostly in left hand. When she stands for a  long time she has lower back pain. 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 Her vision has decreased. She has COPD, on Spiriva. She had rotator cuff surgery, She had right oophorectomy due to benign mass. She is on Zoloft for depression.    GYN HISTORY  Menarchal: 16-17 LMP: in her 67s Contraceptive: NA HRT: NA G1P1 at age 62    REVIEW OF SYSTEMS:    Constitutional: Denies fevers, chills or abnormal night sweats Eyes: Denies blurriness of vision, double vision or watery eyes Ears, nose, mouth, throat, and face: Denies mucositis or sore throat (+) hard of hearing  Respiratory: Denies cough, dyspnea or wheezes Cardiovascular: Denies palpitation, chest discomfort or lower extremity swelling Gastrointestinal:  Denies nausea, heartburn or change in bowel habits Skin: Denies abnormal skin rashes Lymphatics: Denies new lymphadenopathy or easy bruising Neurological:Denies numbness, tingling or new weaknesses Behavioral/Psych: Mood is stable, no new changes  All other systems were reviewed with the patient and are negative.  MEDICAL HISTORY:  Past Medical History:  Diagnosis Date  . Anxiety   . Arthritis   . CAD (coronary artery disease)    a. 03/2017: 95% LCx stenosis --> PCI/DES placed  . Cataract   . Chest pain 03/27/2017  . Chronic diastolic heart failure ([place]) 06/25/2017  . Chronic lower back pain   . COPD (chronic obstructive pulmonary disease) ([place])   . Coronary artery disease   . Diverticulitis   . Diverticulosis   . GERD (gastroesophageal reflux disease)   . Glaucoma   . Heart failure, type unknown ([place]) 03/11/2017  . Hyperlipidemia 03/11/2017  . Hypertension   . Hypothyroidism   . Pneumonia X 1   "years and years ago" (03/26/2017)  . Shortness of breath 03/11/2017  . Status post dilation of esophageal narrowing     SURGICAL HISTORY: Past Surgical History:  Procedure Laterality Date  . BILATERAL OOPHORECTOMY Bilateral   . CATARACT  EXTRACTION    . CATARACT EXTRACTION W/ INTRAOCULAR LENS  IMPLANT, BILATERAL Bilateral   . CORONARY ANGIOPLASTY WITH STENT PLACEMENT  03/26/2017  . LEFT HEART CATH AND CORONARY ANGIOGRAPHY N/A 03/26/2017   Procedure: Left Heart Cath and Coronary Angiography;  Surgeon: Belva Crome, MD;  Location: [facility];  Service: Cardiovascular;  Laterality: N/A;  . NERVE SURGERY     "lump on my sciatic nerve was removed years ago"  . SHOULDER OPEN ROTATOR CUFF REPAIR Right   . THYROIDECTOMY, PARTIAL      SOCIAL HISTORY: Social History   Socioeconomic History  . Marital status: Widowed    Spouse name: Not on file  . Number of children: 1  . Years of education: Not on file  . Highest education level: Not on file  Occupational History  . Occupation: retired  Scientific laboratory technician  . Financial resource strain: Not on file  . Food insecurity    Worry: Not on file    Inability: Not on file  . Transportation needs    Medical: Not on file    Non-medical: Not on file  Tobacco Use  . Smoking status: Current Some Day Smoker    Packs/day: 0.50    Years: 60.00    Pack years: 30.00  Types: Cigarettes  . Smokeless tobacco: Never Used  Substance and Sexual Activity  . Alcohol use: Yes    Comment: twice a month  . Drug use: No  . Sexual activity: Never  Lifestyle  . Physical activity    Days per week: Not on file    Minutes per session: Not on file  . Stress: Not on file  Relationships  . Social Herbalist on phone: Not on file    Gets together: Not on file    Attends religious service: Not on file    Active member of club or organization: Not on file    Attends meetings of clubs or organizations: Not on file    Relationship status: Not on file  . Intimate partner violence    Fear of current or ex partner: Not on file    Emotionally abused: Not on file    Physically abused: Not on file    Forced sexual activity: Not on file  Other Topics Concern  . Not on file  Social  History Narrative  . Not on file    FAMILY HISTORY: Family History  Problem Relation Age of Onset  . CAD Mother   . Cataracts Mother   . Coronary artery disease Father   . Leukemia Sister   . Amblyopia Neg Hx   . Blindness Neg Hx   . Diabetes Neg Hx   . Glaucoma Neg Hx   . Macular degeneration Neg Hx   . Retinal detachment Neg Hx   . Strabismus Neg Hx   . Retinitis pigmentosa Neg Hx     ALLERGIES:  is allergic to lipitor [atorvastatin].  MEDICATIONS:  Current Outpatient Medications  Medication Sig Dispense Refill  . albuterol (PROVENTIL HFA;VENTOLIN HFA) 108 (90 Base) MCG/ACT inhaler Inhale 2 puffs into the lungs every 6 (six) hours as needed (for shortness of breath/wheezing.).     Marland Kitchen aspirin 81 MG chewable tablet Chew 1 tablet (81 mg total) by mouth daily.    . budesonide-formoterol (SYMBICORT) 160-4.5 MCG/ACT inhaler Inhale 2 puffs into the lungs 2 (two) times daily.    . Calcium Carb-Cholecalciferol (CALCIUM + D3) 600-200 MG-UNIT TABS Take 1 tablet by mouth daily.    . carvedilol (COREG) 3.125 MG tablet TAKE 1 TABLET BY MOUTH TWICE DAILY 180 tablet 1  . Cholecalciferol (VITAMIN D3) 2000 units capsule Take 4,000 Units by mouth daily.    . Coenzyme Q10 (COQ10) 100 MG CAPS Take 100 mg by mouth daily.    . dorzolamide (TRUSOPT) 2 % ophthalmic solution Place 1 drop into both eyes 2 (two) times daily.    Marland Kitchen latanoprost (XALATAN) 0.005 % ophthalmic solution Place 1 drop into both eyes at bedtime.    Marland Kitchen levothyroxine (SYNTHROID, LEVOTHROID) 100 MCG tablet TAKE 1 TABLET BY MOUTH ONCE DAILY ON AN EMPTY STOMACH IN THE MORNING FOR 30 DAYS  0  . LORazepam (ATIVAN) 0.5 MG tablet Take 0.5 mg by mouth at bedtime.     . LUTEIN PO Take 1 tablet by mouth daily.    . mirtazapine (REMERON) 15 MG tablet TAKE 1 TABLET BY MOUTH AT BEDTIME ONCE A DAY  1  . Multiple Vitamin (MULTIVITAMIN) tablet Take 1 tablet by mouth daily.    . nitroGLYCERIN (NITROSTAT) 0.4 MG SL tablet Place 1 tablet (0.4 mg total)  under the tongue every 5 (five) minutes as needed for chest pain. 25 tablet 3  . Omega-3 Fatty Acids (FISH OIL PO) Take 5 mLs by  mouth daily.    . sertraline (ZOLOFT) 50 MG tablet Take 50 mg by mouth daily as needed (for anxiety.).     Marland Kitchen TOVIAZ 4 MG TB24 tablet TK 1 T PO  ONCE A DAY  1  . pravastatin (PRAVACHOL) 40 MG tablet TAKE 1 TABLET BY MOUTH DAILY. GENERIC EQUIVALENT FOR PRAVACHOL. NEW DOSE, DISCONTINUE PREVIOUS PRESCRIPTION (Patient not taking: Reported on 04/09/2019) 90 tablet 1  . tamoxifen (NOLVADEX) 20 MG tablet Take 1 tablet (20 mg total) by mouth daily. 30 tablet 3   Current Facility-Administered Medications  Medication Dose Route Frequency Provider Last Rate Last Dose  . aflibercept (EYLEA) SOLN 2 mg  2 mg Intravitreal  Bernarda Caffey, MD   2 mg at 04/16/18 1424  . aflibercept (EYLEA) SOLN 2 mg  2 mg Intravitreal  Bernarda Caffey, MD   2 mg at 05/14/18 1358  . aflibercept (EYLEA) SOLN 2 mg  2 mg Intravitreal  Bernarda Caffey, MD   2 mg at 06/25/18 1433  . aflibercept (EYLEA) SOLN 2 mg  2 mg Intravitreal  Bernarda Caffey, MD   2 mg at 08/07/18 1557  . aflibercept (EYLEA) SOLN 2 mg  2 mg Intravitreal  Bernarda Caffey, MD   2 mg at 10/11/18 1522  . Bevacizumab (AVASTIN) SOLN 1.25 mg  1.25 mg Intravitreal  Bernarda Caffey, MD   1.25 mg at 01/07/18 1429  . Bevacizumab (AVASTIN) SOLN 1.25 mg  1.25 mg Intravitreal  Bernarda Caffey, MD   1.25 mg at 02/19/18 1356  . Bevacizumab (AVASTIN) SOLN 1.25 mg  1.25 mg Intravitreal  Bernarda Caffey, MD   1.25 mg at 03/19/18 1347    PHYSICAL EXAMINATION: ECOG PERFORMANCE STATUS: 1 - Symptomatic but completely ambulatory  Vitals:   04/09/19 1239  BP: 134/67  Pulse: 73  Resp: 18  Temp: 99.1 F (37.3 C)  SpO2: 98%   Filed Weights   04/09/19 1239  Weight: 143 lb 12.8 oz (65.2 kg)    GENERAL:alert, no distress and comfortable SKIN: skin color, texture, turgor are normal, no rashes or significant lesions EYES: normal, Conjunctiva are pink and non-injected,  sclera clear  NECK: supple, thyroid normal size, non-tender, without nodularity LYMPH:  no palpable lymphadenopathy in the cervical, axillary  LUNGS: clear to auscultation and percussion with normal breathing effort HEART: regular rate & rhythm and no murmurs and no lower extremity edema ABDOMEN:abdomen soft, non-tender and normal bowel sounds Musculoskeletal:no cyanosis of digits and no clubbing  NEURO: alert & oriented x 3 with fluent speech, no focal motor/sensory deficits BREAST: (+) 3cm right axiliary LN, movable (+) 2-2.5cm mass in 9:30 position of right breast with skin erythema from biopsy. Left breast exam benign.  LABORATORY DATA:  I have reviewed the data as listed CBC Latest Ref Rng & Units 04/09/2019 03/26/2017 03/21/2017  WBC 4.0 - 10.5 K/uL 6.0 5.6 5.6  Hemoglobin 12.0 - 15.0 g/dL 13.6 12.0 13.1  Hematocrit 36.0 - 46.0 % 41.6 36.3 38.1  Platelets 150 - 400 K/uL 280 270 282    CMP Latest Ref Rng & Units 04/09/2019 02/19/2019 08/28/2018  Glucose 70 - 99 mg/dL 86 92 -  BUN 8 - 23 mg/dL 19 19 -  Creatinine 0.44 - 1.00 mg/dL 0.77 0.70 -  Sodium 135 - 145 mmol/L 139 140 -  Potassium 3.5 - 5.1 mmol/L 4.4 4.5 -  Chloride 98 - 111 mmol/L 104 101 -  CO2 22 - 32 mmol/L 26 22 -  Calcium 8.9 - 10.3 mg/dL 9.8 9.7 -  Total Protein 6.5 - 8.1 g/dL 7.5 6.8 6.9  Total Bilirubin 0.3 - 1.2 mg/dL 0.3 0.3 0.4  Alkaline Phos 38 - 126 U/L 80 74 78  AST 15 - 41 U/L 26 27 30   ALT 0 - 44 U/L 15 11 21      RADIOGRAPHIC STUDIES: I have personally reviewed the radiological images as listed and agreed with the findings in the report. No results found.  ASSESSMENT & PLAN:  Marissa Caldwell is a 85 y.o. Caucasian female with a history of CAD, COPD, GERD, HLD, macular degeneration, underweight.    1. Malignant neoplasm of upper-outer quadrant of right breast, invasive ductal carcinoma, Stage IIA, c(T1cN1M0), ER/PR+, HER2-, Grade III -We discussed her image findings and the biopsy results in great  details. She has invasive ductal carcinoma which has spread to her biopsied LN.  -I discussed standard treatment is surgery which is the only way to be cured. However, given her advanced age, comorbidities (especially her CAD and COPD),  Dr. Marlou Starks will check with her cardiologist for her to be cleared for surgery  -If she is cleared for surgery, I would recommend CT CAP/Bone scan to rule out distant metastasis given her large size of node metastasis.  -I discussed her ER/PR +/HER2- breast cancer is the more common type of breast cancer, most are slow growing, but some could be more aggressive.  -due to her advanced age, I would not recommend adjuvant chemotherapy. ---Giving her strongly ER and PR positivity of the tumor cells, and her osteoporosis with history of fracture, I recommend adjuvant endocrine therapy with tamoxifen. The potential side effects, which includes but not limited to, hot flash, skin and vaginal dryness, slightly increased risk of cardiovascular disease and cataract, small risk of thrombosis and endometrial cancer, were discussed with her in great details. Preventive strategies for thrombosis, such as being physically active, using compression stocks, avoid cigarette smoking, etc., were reviewed with her. I also recommend her to follow-up with her gynecologist once a year, and watch for vaginal spotting or bleeding, as a clinically sign of endometrial cancer, etc. She voiced good understanding, and agrees to proceed.  -While she waits to be cleared or if she does not proceed with surgery I recommend she start antiestrogen therapy for disease control, although her breast cancer would not be curative by antiestrogen therapy alone. She understands and agreed to proceed.  -I called in tamoxifen today, she will start in the next few days. -She will discuss the option of Radiation with Dr. Isidore Moos today.  -her labs reviewed, CBC and CMP WNL. Her physical exam shows right axillary LN and right  breast mass, likely bigger since her biopsy.  -Will monitor her cancer with right breast and axillary [place] in 5-16month.  -F/u in 2 months if she does not proceed with surgery.    2. CAD, HTN -She has had a cardiac stent placed in the past  -She is on Coreg, nitro, pravastatin and other meds for her heart -She will continue to be closely followed by her cardiologist.    3. COPD, Long Term smoking  -On inhalers -She still smokes occasionally. She has not completely quit.    4. Underweight  -After drastic change in diet she had lowered appetite and lost significant weight in the last year. She has started to gain some of that weight back.  -She is still able to be independent and take care of herself and her home. She lives alone with good support and help  from her son.  -I encouraged her to continue adequate nutrition and gaining weight.  -She is on Mirtazapine for appetite and sleep   5. Osteoporosis, Arthritis  -She receives Prolia injections q45month with Dr. WDema Severin  -She had fracture of her wrists in 2019    -Her joint pain is mainly in her left hand and lower back when she stands for a long time.    6. Macular Degeneration of both eyes, hearing loss -Managed by her Ophthalmologist   7. Anxiety/depression  -On Zoloft, mood stable  -We discussed that zoloft has very mild interaction with Tamoxifen, will monitor for now     PLAN:  -I prescribed Tamoxifen today to start this week. Will hold for surgery and restart once she is ambulating adequately after surgery.  -Lab and f/u in 2 months if she does not proceed with surgery    No orders of the defined types were placed in this encounter.   All questions were answered. The patient knows to call the clinic with any problems, questions or concerns. I spent 55 minutes counseling the patient face to face. The total time spent in the appointment was 60 minutes and more than 50% was on counseling.     YTruitt Merle MD 04/09/2019  4:26 PM  I, AJoslyn Devon am acting as scribe for YTruitt Merle MD.   I have reviewed the above documentation for accuracy and completeness, and I agree with the above.

## 2019-04-08 NOTE — Progress Notes (Signed)
Radiation Oncology         [phone]) [phone] ________________________________  Initial outpatient Consultation  Name: Marissa Caldwell MRN: [id number]  Date: 04/09/2019  DOB: [birth year]  CC:Marissa Stains, MD  Marissa Kussmaul, MD   REFERRING PHYSICIAN: Autumn Messing III, MD  DIAGNOSIS:    ICD-10-CM   1. Malignant neoplasm of upper-outer quadrant of right breast in female, estrogen receptor positive ([place])  C50.411    Z17.0    Cancer Staging Malignant neoplasm of upper-outer quadrant of right breast in female, estrogen receptor positive ([place]) Staging form: Breast, AJCC 8th Edition - Clinical: Stage IIA (cT1c, cN1, cM0, G3, ER+, PR+, HER2-) - Signed by [name], MD on 04/09/2019  CHIEF COMPLAINT: Here to discuss management of right breast cancer  HISTORY OF PRESENT ILLNESS::Marissa Caldwell is a 85 y.o. female who presented with breast abnormality on the following imaging: diagnostic mammogram on the date of 03/19/2019.  Symptoms, if any, at that time, were: lump for two months following dog jumping on her right breast.   Ultrasound of breast the same day revealed: 2 cm irregular mass at 9:30 in the right breast; 3.2 cm enlarged right axillary lymph node.   Right breast biopsy on date of 03/31/2019 showed: invasive ductal carcinoma with lymphovascular invasion.  ER status: 100% positive; PR status 10% positive, Her2 status negative; Grade 3. Right "lymph node" biopsy showed: invasive ductal carcinoma, no lymph nodal tissue; morphologically dissimilar from breast biopsy.  ER status: 100% positive; PR status 0% negative; Her2 negative,  Grade 2.  She is with her son today. She is very HOH.  She has many medical issues, see below.  PREVIOUS RADIATION THERAPY: No  PAST MEDICAL HISTORY:  has a past medical history of Anxiety, Arthritis, CAD (coronary artery disease), Cataract, Chest pain (03/27/2017), Chronic diastolic heart failure ([place]) (06/25/2017), Chronic lower back pain, COPD (chronic obstructive  pulmonary disease) ([place]), Coronary artery disease, Diverticulitis, Diverticulosis, GERD (gastroesophageal reflux disease), Glaucoma, Heart failure, type unknown ([place]) (03/11/2017), Hyperlipidemia (03/11/2017), Hypertension, Hypothyroidism, Pneumonia (X 1), Shortness of breath (03/11/2017), and Status post dilation of esophageal narrowing.    PAST SURGICAL HISTORY: Past Surgical History:  Procedure Laterality Date   BILATERAL OOPHORECTOMY Bilateral    CATARACT EXTRACTION     CATARACT EXTRACTION W/ INTRAOCULAR LENS  IMPLANT, BILATERAL Bilateral    CORONARY ANGIOPLASTY WITH STENT PLACEMENT  03/26/2017   LEFT HEART CATH AND CORONARY ANGIOGRAPHY N/A 03/26/2017   Procedure: Left Heart Cath and Coronary Angiography;  Surgeon: Belva Crome, MD;  Location: [facility];  Service: Cardiovascular;  Laterality: N/A;   NERVE SURGERY     "lump on my sciatic nerve was removed years ago"   SHOULDER OPEN ROTATOR CUFF REPAIR Right    THYROIDECTOMY, PARTIAL      FAMILY HISTORY: family history includes CAD in her mother; Cataracts in her mother; Coronary artery disease in her father; Leukemia in her sister.  SOCIAL HISTORY:  reports that she has been smoking cigarettes. She has a 30.00 pack-year smoking history. She has never used smokeless tobacco. She reports current alcohol use. She reports that she does not use drugs.  ALLERGIES: Lipitor [atorvastatin]  MEDICATIONS:  Current Outpatient Medications  Medication Sig Dispense Refill   albuterol (PROVENTIL HFA;VENTOLIN HFA) 108 (90 Base) MCG/ACT inhaler Inhale 2 puffs into the lungs every 6 (six) hours as needed (for shortness of breath/wheezing.).      aspirin 81 MG chewable tablet Chew 1 tablet (81 mg total) by mouth daily.  budesonide-formoterol (SYMBICORT) 160-4.5 MCG/ACT inhaler Inhale 2 puffs into the lungs 2 (two) times daily.     Calcium Carb-Cholecalciferol (CALCIUM + D3) 600-200 MG-UNIT TABS Take 1 tablet by mouth daily.      carvedilol (COREG) 3.125 MG tablet TAKE 1 TABLET BY MOUTH TWICE DAILY 180 tablet 1   Cholecalciferol (VITAMIN D3) 2000 units capsule Take 4,000 Units by mouth daily.     Coenzyme Q10 (COQ10) 100 MG CAPS Take 100 mg by mouth daily.     dorzolamide (TRUSOPT) 2 % ophthalmic solution Place 1 drop into both eyes 2 (two) times daily.     latanoprost (XALATAN) 0.005 % ophthalmic solution Place 1 drop into both eyes at bedtime.     levothyroxine (SYNTHROID, LEVOTHROID) 100 MCG tablet TAKE 1 TABLET BY MOUTH ONCE DAILY ON AN EMPTY STOMACH IN THE MORNING FOR 30 DAYS  0   LORazepam (ATIVAN) 0.5 MG tablet Take 0.5 mg by mouth at bedtime.      LUTEIN PO Take 1 tablet by mouth daily.     mirtazapine (REMERON) 15 MG tablet TAKE 1 TABLET BY MOUTH AT BEDTIME ONCE A DAY  1   Multiple Vitamin (MULTIVITAMIN) tablet Take 1 tablet by mouth daily.     nitroGLYCERIN (NITROSTAT) 0.4 MG SL tablet Place 1 tablet (0.4 mg total) under the tongue every 5 (five) minutes as needed for chest pain. 25 tablet 3   Omega-3 Fatty Acids (FISH OIL PO) Take 5 mLs by mouth daily.     pravastatin (PRAVACHOL) 40 MG tablet TAKE 1 TABLET BY MOUTH DAILY. GENERIC EQUIVALENT FOR PRAVACHOL. NEW DOSE, DISCONTINUE PREVIOUS PRESCRIPTION (Patient not taking: Reported on 04/09/2019) 90 tablet 1   sertraline (ZOLOFT) 50 MG tablet Take 50 mg by mouth daily as needed (for anxiety.).      tamoxifen (NOLVADEX) 20 MG tablet Take 1 tablet (20 mg total) by mouth daily. 30 tablet 3   TOVIAZ 4 MG TB24 tablet TK 1 T PO  ONCE A DAY  1   Current Facility-Administered Medications  Medication Dose Route Frequency Provider Last Rate Last Dose   aflibercept (EYLEA) SOLN 2 mg  2 mg Intravitreal  Bernarda Caffey, MD   2 mg at 04/16/18 1424   aflibercept (EYLEA) SOLN 2 mg  2 mg Intravitreal  Bernarda Caffey, MD   2 mg at 05/14/18 1358   aflibercept (EYLEA) SOLN 2 mg  2 mg Intravitreal  Bernarda Caffey, MD   2 mg at 06/25/18 1433   aflibercept (EYLEA) SOLN 2  mg  2 mg Intravitreal  Bernarda Caffey, MD   2 mg at 08/07/18 1557   aflibercept (EYLEA) SOLN 2 mg  2 mg Intravitreal  Bernarda Caffey, MD   2 mg at 10/11/18 1522   Bevacizumab (AVASTIN) SOLN 1.25 mg  1.25 mg Intravitreal  Bernarda Caffey, MD   1.25 mg at 01/07/18 1429   Bevacizumab (AVASTIN) SOLN 1.25 mg  1.25 mg Intravitreal  Bernarda Caffey, MD   1.25 mg at 02/19/18 1356   Bevacizumab (AVASTIN) SOLN 1.25 mg  1.25 mg Intravitreal  Bernarda Caffey, MD   1.25 mg at 03/19/18 1347    REVIEW OF SYSTEMS: As above    PHYSICAL EXAM:  Vitals:   04/09/19 1239  BP: 134/67  Pulse: 73  Resp: 18  Temp: 99.1 F (37.3 C)  SpO2: 98%   Filed Weights   04/09/19 1239  Weight: 143 lb 12.8 oz (65.2 kg)   General: Alert and oriented, in no acute distress HEENT:  Head is normocephalic.  Very hard of hearing. Breasts:  Right breast notable for 2.5 cm mass in LOQ . Questionable firmness in right axilla.  No other palpable masses appreciated in the breasts or axillae bilaterally   ECOG = 2 0 - Asymptomatic (Fully active, able to carry on all predisease activities without restriction)  1 - Symptomatic but completely ambulatory (Restricted in physically strenuous activity but ambulatory and able to carry out work of a light or sedentary nature. For example, light housework, office work)  2 - Symptomatic, <50% in bed during the day (Ambulatory and capable of all self care but unable to carry out any work activities. Up and about more than 50% of waking hours)  3 - Symptomatic, >50% in bed, but not bedbound (Capable of only limited self-care, confined to bed or chair 50% or more of waking hours)  4 - Bedbound (Completely disabled. Cannot carry on any self-care. Totally confined to bed or chair)  5 - Death   Eustace Pen MM, Creech RH, Tormey DC, et al. [phone]). "Toxicity and response criteria of the [facility]". [place] Oncol. 5 (6): 649-55   LABORATORY DATA:  Lab Results  Component  Value Date   WBC 6.0 04/09/2019   HGB 13.6 04/09/2019   HCT 41.6 04/09/2019   MCV 94.5 04/09/2019   PLT 280 04/09/2019   CMP     Component Value Date/Time   NA 139 04/09/2019 1224   NA 140 02/19/2019 1115   K 4.4 04/09/2019 1224   CL 104 04/09/2019 1224   CO2 26 04/09/2019 1224   GLUCOSE 86 04/09/2019 1224   BUN 19 04/09/2019 1224   BUN 19 02/19/2019 1115   CREATININE 0.77 04/09/2019 1224   CALCIUM 9.8 04/09/2019 1224   PROT 7.5 04/09/2019 1224   PROT 6.8 02/19/2019 1115   ALBUMIN 3.5 04/09/2019 1224   ALBUMIN 4.0 02/19/2019 1115   AST 26 04/09/2019 1224   ALT 15 04/09/2019 1224   ALKPHOS 80 04/09/2019 1224   BILITOT 0.3 04/09/2019 1224   GFRNONAA >60 04/09/2019 1224   GFRAA >60 04/09/2019 1224         RADIOGRAPHY: as above     IMPRESSION/PLAN: right breast cancer  Due to other medical issues, it is not clear yet if surgery will be relatively safe. She will start antiestrogen medications and Dr Marlou Starks will work up her eligibility for surgery.  Radiation can be considered adjuvantly, depending on whether she undergoes surgery and depending on how she recovers from that.  Otherwise, it's possible she will take palliative antiestrogens alone.  I will await her referral back to me for postoperative follow-up and eventual CT simulation/treatment planning as warranted.  __________________________________________   Eppie Gibson, MD  This document serves as a record of services personally performed by Eppie Gibson, MD. It was created on her behalf by Wilburn Mylar, a trained medical scribe. The creation of this record is based on the scribe's personal observations and the provider's statements to them. This document has been checked and approved by the attending provider.

## 2019-04-09 ENCOUNTER — Encounter: Payer: Self-pay | Admitting: Hematology

## 2019-04-09 ENCOUNTER — Telehealth: Payer: Self-pay | Admitting: Hematology

## 2019-04-09 ENCOUNTER — Ambulatory Visit
Admission: RE | Admit: 2019-04-09 | Discharge: 2019-04-09 | Disposition: A | Discharge status: Home / Self Care | Payer: Medicare Other | Source: Ambulatory Visit | Attending: Radiation Oncology | Admitting: Radiation Oncology

## 2019-04-09 ENCOUNTER — Other Ambulatory Visit: Payer: Self-pay

## 2019-04-09 ENCOUNTER — Inpatient Hospital Stay: Payer: Medicare Other | Attending: Hematology | Admitting: Hematology

## 2019-04-09 ENCOUNTER — Ambulatory Visit: Payer: Medicare Other | Admitting: Physical Therapy

## 2019-04-09 ENCOUNTER — Inpatient Hospital Stay: Payer: Medicare Other

## 2019-04-09 VITALS — BP 134/67 | HR 73 | Temp 99.1°F | Resp 18 | Ht 70.0 in | Wt 143.8 lb

## 2019-04-09 DIAGNOSIS — Z7982 Long term (current) use of aspirin: Secondary | ICD-10-CM | POA: Diagnosis not present

## 2019-04-09 DIAGNOSIS — E785 Hyperlipidemia, unspecified: Secondary | ICD-10-CM | POA: Insufficient documentation

## 2019-04-09 DIAGNOSIS — I10 Essential (primary) hypertension: Secondary | ICD-10-CM | POA: Diagnosis not present

## 2019-04-09 DIAGNOSIS — E039 Hypothyroidism, unspecified: Secondary | ICD-10-CM | POA: Insufficient documentation

## 2019-04-09 DIAGNOSIS — C50411 Malignant neoplasm of upper-outer quadrant of right female breast: Secondary | ICD-10-CM

## 2019-04-09 DIAGNOSIS — M810 Age-related osteoporosis without current pathological fracture: Secondary | ICD-10-CM

## 2019-04-09 DIAGNOSIS — J449 Chronic obstructive pulmonary disease, unspecified: Secondary | ICD-10-CM

## 2019-04-09 DIAGNOSIS — H3530 Unspecified macular degeneration: Secondary | ICD-10-CM

## 2019-04-09 DIAGNOSIS — Z79899 Other long term (current) drug therapy: Secondary | ICD-10-CM | POA: Diagnosis not present

## 2019-04-09 DIAGNOSIS — F419 Anxiety disorder, unspecified: Secondary | ICD-10-CM | POA: Insufficient documentation

## 2019-04-09 DIAGNOSIS — F329 Major depressive disorder, single episode, unspecified: Secondary | ICD-10-CM | POA: Diagnosis not present

## 2019-04-09 DIAGNOSIS — C773 Secondary and unspecified malignant neoplasm of axilla and upper limb lymph nodes: Secondary | ICD-10-CM | POA: Diagnosis not present

## 2019-04-09 DIAGNOSIS — Z806 Family history of leukemia: Secondary | ICD-10-CM

## 2019-04-09 DIAGNOSIS — Z9861 Coronary angioplasty status: Secondary | ICD-10-CM

## 2019-04-09 DIAGNOSIS — I2510 Atherosclerotic heart disease of native coronary artery without angina pectoris: Secondary | ICD-10-CM | POA: Diagnosis not present

## 2019-04-09 DIAGNOSIS — Z7951 Long term (current) use of inhaled steroids: Secondary | ICD-10-CM | POA: Diagnosis not present

## 2019-04-09 DIAGNOSIS — Z170 Estrogen receptor positive status [ER+]: Secondary | ICD-10-CM | POA: Diagnosis not present

## 2019-04-09 DIAGNOSIS — R636 Underweight: Secondary | ICD-10-CM

## 2019-04-09 DIAGNOSIS — I5032 Chronic diastolic (congestive) heart failure: Secondary | ICD-10-CM | POA: Diagnosis not present

## 2019-04-09 DIAGNOSIS — F17210 Nicotine dependence, cigarettes, uncomplicated: Secondary | ICD-10-CM | POA: Diagnosis not present

## 2019-04-09 DIAGNOSIS — I110 Hypertensive heart disease with heart failure: Secondary | ICD-10-CM | POA: Insufficient documentation

## 2019-04-09 DIAGNOSIS — M1990 Unspecified osteoarthritis, unspecified site: Secondary | ICD-10-CM

## 2019-04-09 DIAGNOSIS — F341 Dysthymic disorder: Secondary | ICD-10-CM

## 2019-04-09 DIAGNOSIS — R590 Localized enlarged lymph nodes: Secondary | ICD-10-CM | POA: Diagnosis not present

## 2019-04-09 LAB — CMP (CANCER CENTER ONLY)
ALT: 15 U/L (ref 0–44)
AST: 26 U/L (ref 15–41)
Albumin: 3.5 g/dL (ref 3.5–5.0)
Alkaline Phosphatase: 80 U/L (ref 38–126)
Anion gap: 9 (ref 5–15)
BUN: 19 mg/dL (ref 8–23)
CO2: 26 mmol/L (ref 22–32)
Calcium: 9.8 mg/dL (ref 8.9–10.3)
Chloride: 104 mmol/L (ref 98–111)
Creatinine: 0.77 mg/dL (ref 0.44–1.00)
GFR, Est AFR Am: >60 mL/min (ref >60)
GFR, Estimated: >60 mL/min (ref >60)
Glucose, Bld: 86 mg/dL (ref 70–99)
Potassium: 4.4 mmol/L (ref 3.5–5.1)
Sodium: 139 mmol/L (ref 135–145)
Total Bilirubin: 0.3 mg/dL (ref 0.3–1.2)
Total Protein: 7.5 g/dL (ref 6.5–8.1)

## 2019-04-09 LAB — CBC WITH DIFFERENTIAL (CANCER CENTER ONLY)
Abs Immature Granulocytes: 0 10*3/uL (ref 0.00–0.07)
Basophils Absolute: 0 10*3/uL (ref 0.0–0.1)
Basophils Relative: 1 %
Eosinophils Absolute: 0.1 10*3/uL (ref 0.0–0.5)
Eosinophils Relative: 2 %
HCT: 41.6 % (ref 36.0–46.0)
Hemoglobin: 13.6 g/dL (ref 12.0–15.0)
Immature Granulocytes: 0 %
Lymphocytes Relative: 21 %
Lymphs Abs: 1.3 10*3/uL (ref 0.7–4.0)
MCH: 30.9 pg (ref 26.0–34.0)
MCHC: 32.7 g/dL (ref 30.0–36.0)
MCV: 94.5 fL (ref 80.0–100.0)
Monocytes Absolute: 0.8 10*3/uL (ref 0.1–1.0)
Monocytes Relative: 14 %
Neutro Abs: 3.8 10*3/uL (ref 1.7–7.7)
Neutrophils Relative %: 62 %
Platelet Count: 280 10*3/uL (ref 150–400)
RBC: 4.4 MIL/uL (ref 3.87–5.11)
RDW: 14 % (ref 11.5–15.5)
WBC Count: 6 10*3/uL (ref 4.0–10.5)
nRBC: 0 % (ref 0.0–0.2)

## 2019-04-09 MED ORDER — TAMOXIFEN CITRATE 20 MG PO TABS: 20.0000 mg | ORAL_TABLET | Freq: Every day | ORAL | 3 refills | Status: DC

## 2019-04-09 NOTE — Telephone Encounter (Signed)
Scheduled appt per 8/19 los.  Left a voice message of appt date and time.

## 2019-04-10 ENCOUNTER — Encounter: Payer: Self-pay | Admitting: Radiation Oncology

## 2019-04-11 DIAGNOSIS — H401331 Pigmentary glaucoma, bilateral, mild stage: Secondary | ICD-10-CM | POA: Diagnosis not present

## 2019-04-14 ENCOUNTER — Telehealth: Payer: Self-pay

## 2019-04-14 NOTE — Telephone Encounter (Signed)
Pt notified, appt make with Luke 8-31 @245  she will arrive early and wear a mask

## 2019-04-14 NOTE — Telephone Encounter (Signed)
   Primary Parker, MD  Chart reviewed as part of pre-operative protocol coverage. Because of Marissa Caldwell's past medical history and time since last visit, he/she will require a follow-up visit in order to better assess preoperative cardiovascular risk.  She reports another episode of chest pain since her last appt.   Pre-op covering staff: - Please schedule appointment and call patient to inform them. - Please contact requesting surgeon's office via preferred method (i.e, phone, fax) to inform them of need for appointment prior to surgery.  If applicable, this message will also be routed to pharmacy pool and/or primary cardiologist for input on holding anticoagulant/antiplatelet agent as requested below so that this information is available at time of patient's appointment.   Howard, PA  04/14/2019, 2:07 PM

## 2019-04-14 NOTE — Telephone Encounter (Signed)
   [facility] Pre-operative Risk Assessment    Request for surgical clearance:  1. What type of surgery is being performed? breast lumpectomy   2. When is this surgery scheduled? pending   3. What type of clearance is required (medical clearance vs. Pharmacy clearance to hold med vs. Both)? medical  4. Are there any medications that need to be held prior to surgery and how long? n/a   5. Practice name and name of physician performing surgery? [facility]  Dr Fredrik Cove   6. What is your office phone number? [phone]    7.   What is your office fax number? [place]   8.   Anesthesia type (None, local, MAC, general)? general anesthesia   Marissa Caldwell, Marissa Caldwell 04/14/2019, 11:50 AM  _________________________________________________________________   (provider comments below)

## 2019-04-17 ENCOUNTER — Telehealth: Payer: Self-pay | Admitting: *Deleted

## 2019-04-17 NOTE — Telephone Encounter (Signed)
Spoke with patient's son to answer any questions from [facility] last w/eek.  She has an appointment with cardiologist 8/31.  I will send Dr. Ethlyn Gallery office a message to please contact son with any appointments.  Encouraged to call with any needs or concerns.

## 2019-04-21 ENCOUNTER — Telehealth: Payer: Self-pay

## 2019-04-21 ENCOUNTER — Telehealth (INDEPENDENT_AMBULATORY_CARE_PROVIDER_SITE_OTHER): Payer: Medicare Other | Admitting: Cardiology

## 2019-04-21 ENCOUNTER — Encounter: Payer: Self-pay | Admitting: Cardiology

## 2019-04-21 VITALS — BP 157/82 | HR 72 | Ht 69.0 in | Wt 143.0 lb

## 2019-04-21 DIAGNOSIS — R079 Chest pain, unspecified: Secondary | ICD-10-CM

## 2019-04-21 NOTE — Progress Notes (Signed)
Virtual Visit via Telephone Note   This visit type was conducted due to national recommendations for restrictions regarding the COVID-19 Pandemic (e.g. social distancing) in an effort to limit this patient's exposure and mitigate transmission in our community.  Due to her co-morbid illnesses, this patient is at least at moderate risk for complications without adequate follow up.  This format is felt to be most appropriate for this patient at this time.  The patient did not have access to video technology/had technical difficulties with video requiring transitioning to audio format only (telephone).  All issues noted in this document were discussed and addressed.  No physical exam could be performed with this format.  Please refer to the patient's chart for her  consent to telehealth for [facility]   Date:  04/21/2019   ID:  NONI PULLEY, DOB 02-01-1932, MRN [id number]  Patient Location: Home Provider Location: Home  PCP:  [name], MD  Cardiologist:  Skeet Latch, MD  Electrophysiologist:  None   Evaluation Performed:  Follow-Up Visit  Chief Complaint:  Pre op clearance  History of Present Illness:    CELECIA HOHLT is a 85 y.o. female who lives alone in a senior apartment complex with a hx of CAD, status post distal circumflex PCI with DES August 2018 after an abnormal Myoview.  I believe she originally presented with diastolic heart failure in July 2018.  Other medical issues include COPD, hard of hearing, and dyslipidemia.  She was seen in the office 02/27/2019.  She related an episode of SSCP while out with a friend. She went out with her friend to get takeout.  It was the first time she had been out of her apartment in a long time.  After they got there taken out she developed sudden chest pain.  She got sweaty with this.  She had her friend drive her home.  She did take 1 nitroglycerin.  She was quite weak when she got to her apartment.  She said when she got in the  air conditioning she felt fine and had not had further chest pain. When I saw her 7/9 she was doing well.  She was referred to [place] today for preoperative clearance.  Unfortunately she is been recently diagnosed with invasive breast cancer.  The patient admitted to me today that she has had another episode of chest pain.  It is kind of hard for her to describe her symptoms exactly, she was not clear about her presenting symptoms prior to her PCI either.  She thinks then she mainly had a cough.  The patient does not have symptoms concerning for COVID-19 infection (fever, chills, cough, or new shortness of breath).    Past Medical History:  Diagnosis Date  . Anxiety   . Arthritis   . CAD (coronary artery disease)    a. 03/2017: 95% LCx stenosis --> PCI/DES placed  . Cataract   . Chest pain 03/27/2017  . Chronic diastolic heart failure ([place]) 06/25/2017  . Chronic lower back pain   . COPD (chronic obstructive pulmonary disease) ([place])   . Coronary artery disease   . Diverticulitis   . Diverticulosis   . GERD (gastroesophageal reflux disease)   . Glaucoma   . Heart failure, type unknown ([place]) 03/11/2017  . Hyperlipidemia 03/11/2017  . Hypertension   . Hypothyroidism   . Pneumonia X 1   "years and years ago" (03/26/2017)  . Shortness of breath 03/11/2017  . Status post dilation of esophageal narrowing  Past Surgical History:  Procedure Laterality Date  . BILATERAL OOPHORECTOMY Bilateral   . CATARACT EXTRACTION    . CATARACT EXTRACTION W/ INTRAOCULAR LENS  IMPLANT, BILATERAL Bilateral   . CORONARY ANGIOPLASTY WITH STENT PLACEMENT  03/26/2017  . LEFT HEART CATH AND CORONARY ANGIOGRAPHY N/A 03/26/2017   Procedure: Left Heart Cath and Coronary Angiography;  Surgeon: Belva Crome, MD;  Location: [facility];  Service: Cardiovascular;  Laterality: N/A;  . NERVE SURGERY     "lump on my sciatic nerve was removed years ago"  . SHOULDER OPEN ROTATOR CUFF REPAIR Right   . THYROIDECTOMY,  PARTIAL       Current Meds  Medication Sig  . albuterol (PROVENTIL HFA;VENTOLIN HFA) 108 (90 Base) MCG/ACT inhaler Inhale 2 puffs into the lungs every 6 (six) hours as needed (for shortness of breath/wheezing.).   Marland Kitchen aspirin 81 MG chewable tablet Chew 1 tablet (81 mg total) by mouth daily.  . budesonide-formoterol (SYMBICORT) 160-4.5 MCG/ACT inhaler Inhale 2 puffs into the lungs 2 (two) times daily.  . Calcium Carb-Cholecalciferol (CALCIUM + D3) 600-200 MG-UNIT TABS Take 1 tablet by mouth daily.  . carvedilol (COREG) 3.125 MG tablet TAKE 1 TABLET BY MOUTH TWICE DAILY  . Cholecalciferol (VITAMIN D3) 2000 units capsule Take 4,000 Units by mouth daily.  . Coenzyme Q10 (COQ10) 100 MG CAPS Take 100 mg by mouth daily.  . dorzolamide (TRUSOPT) 2 % ophthalmic solution Place 1 drop into both eyes 2 (two) times daily.  Marland Kitchen latanoprost (XALATAN) 0.005 % ophthalmic solution Place 1 drop into both eyes at bedtime.  Marland Kitchen levothyroxine (SYNTHROID, LEVOTHROID) 100 MCG tablet TAKE 1 TABLET BY MOUTH ONCE DAILY ON AN EMPTY STOMACH IN THE MORNING FOR 30 DAYS  . LORazepam (ATIVAN) 0.5 MG tablet Take 0.5 mg by mouth at bedtime.   . LUTEIN PO Take 1 tablet by mouth daily.  . Multiple Vitamin (MULTIVITAMIN) tablet Take 1 tablet by mouth daily.  . nitroGLYCERIN (NITROSTAT) 0.4 MG SL tablet Place 1 tablet (0.4 mg total) under the tongue every 5 (five) minutes as needed for chest pain.  . Omega-3 Fatty Acids (FISH OIL PO) Take 5 mLs by mouth daily.  . pravastatin (PRAVACHOL) 40 MG tablet TAKE 1 TABLET BY MOUTH DAILY. GENERIC EQUIVALENT FOR PRAVACHOL. NEW DOSE, DISCONTINUE PREVIOUS PRESCRIPTION  . tamoxifen (NOLVADEX) 20 MG tablet Take 1 tablet (20 mg total) by mouth daily.  . TOVIAZ 4 MG TB24 tablet TK 1 T PO  ONCE A DAY   Current Facility-Administered Medications for the 04/21/19 encounter (Telemedicine) with Erlene Quan, PA-C  Medication  . aflibercept (EYLEA) SOLN 2 mg  . aflibercept (EYLEA) SOLN 2 mg  . aflibercept  (EYLEA) SOLN 2 mg  . aflibercept (EYLEA) SOLN 2 mg  . aflibercept (EYLEA) SOLN 2 mg  . Bevacizumab (AVASTIN) SOLN 1.25 mg  . Bevacizumab (AVASTIN) SOLN 1.25 mg  . Bevacizumab (AVASTIN) SOLN 1.25 mg     Allergies:   Lipitor [atorvastatin]   Social History   Tobacco Use  . Smoking status: Current Some Day Smoker    Packs/day: 0.50    Years: 60.00    Pack years: 30.00    Types: Cigarettes  . Smokeless tobacco: Never Used  Substance Use Topics  . Alcohol use: Yes    Comment: twice a month  . Drug use: No     Family Hx: The patient's family history includes CAD in her mother; Cataracts in her mother; Coronary artery disease in her father; Leukemia  in her sister. There is no history of Amblyopia, Blindness, Diabetes, Glaucoma, Macular degeneration, Retinal detachment, Strabismus, or Retinitis pigmentosa.  ROS:   Please see the history of present illness.    All other systems reviewed and are negative.   Prior CV studies:   The following studies were reviewed today:   Labs/Other Tests and Data Reviewed:    EKG:  No ECG reviewed.  Recent Labs: 04/09/2019: ALT 15; BUN 19; Creatinine 0.77; Hemoglobin 13.6; Platelet Count 280; Potassium 4.4; Sodium 139   Recent Lipid Panel Lab Results  Component Value Date/Time   CHOL 202 (H) 02/19/2019 11:15 AM   TRIG 83 02/19/2019 11:15 AM   HDL 71 02/19/2019 11:15 AM   CHOLHDL 2.8 02/19/2019 11:15 AM   LDLCALC 114 (H) 02/19/2019 11:15 AM    Wt Readings from Last 3 Encounters:  04/21/19 143 lb (64.9 kg)  04/09/19 143 lb 12.8 oz (65.2 kg)  02/27/19 143 lb 3.2 oz (65 kg)     Objective:    Vital Signs:  BP (!) 157/82   Pulse 72   Ht 5\' 9"  (1.753 m)   Wt 143 lb (64.9 kg)   BMI 21.12 kg/m    VITAL SIGNS:  reviewed  ASSESSMENT & PLAN:    Pre op clearance The patient was contacted for pre op clearance.  She has had another episode of chest pain.  I feel it would be best to obtain a Myoview prior to clearing her for surgery.   Her Myoview prior to her PCI in 2018 was abnormal.   CAD S/P percutaneous coronary angioplasty S/P angioplasty with DES 03/26/17 to dLCX after an abnormal Myoview- EF 60%  Chest pain Not clearly anginal but concerning given her history.  Dyslipidemia Elevated LFTs on Lipitor-changed to Pravastatin in the past secondary to elevated LFTs.  Invasive breast cancer Diagnosed 03/31/2019- surgery and chemotherapy recommended.   Plan: Lexiscan Myoview ASAP- clearance pending this.   COVID-19 Education: The signs and symptoms of COVID-19 were discussed with the patient and how to seek care for testing (follow up with PCP or arrange E-visit).  The importance of social distancing was discussed today.  Time:   Today, I have spent 20 minutes with the patient with telehealth technology discussing the above problems.     Medication Adjustments/Labs and Tests Ordered: Current medicines are reviewed at length with the patient today.  Concerns regarding medicines are outlined above.   Tests Ordered: Orders Placed This Encounter  Procedures  . MYOCARDIAL PERFUSION IMAGING    Medication Changes: No orders of the defined types were placed in this encounter.   Follow Up:  In Person Dr Oval Linsey 2 months  Signed, Kerin Ransom, PA-C  04/21/2019 4:07 PM    [facility]

## 2019-04-21 NOTE — Telephone Encounter (Signed)
Virtual Visit Pre-Appointment Phone Call  "MRS Marissa Caldwell, I am calling you today to discuss your upcoming appointment. We are currently trying to limit exposure to the virus that causes COVID-19 by seeing patients at home rather than in the office."  1. "What is the BEST phone number to call the day of the visit?" - include this in appointment notes  2. "Do you have or have access to (through a family member/friend) a smartphone with video capability that we can use for your visit?" a. If yes - list this number in appt notes as "cell" (if different from BEST phone #) and list the appointment type as a VIDEO visit in appointment notes b. If no - list the appointment type as a PHONE visit in appointment notes  3. Confirm consent - "In the setting of the current Covid19 crisis, you are scheduled for a (phone or video) visit with your provider on (date) at (time).  Just as we do with many in-office visits, in order for you to participate in this visit, we must obtain consent.  If you'd like, I can send this to your mychart (if signed up) or email for you to review.  Otherwise, I can obtain your verbal consent now.  All virtual visits are billed to your insurance company just like a normal visit would be.  By agreeing to a virtual visit, we'd like you to understand that the technology does not allow for your provider to perform an examination, and thus may limit your provider's ability to fully assess your condition. If your provider identifies any concerns that need to be evaluated in person, we will make arrangements to do so.  Finally, though the technology is pretty good, we cannot assure that it will always work on either your or our end, and in the setting of a video visit, we may have to convert it to a phone-only visit.  In either situation, we cannot ensure that we have a secure connection.  Are you willing to proceed?" STAFF: Did the patient verbally acknowledge consent to telehealth visit?  Document YES/NO here: YES  4. Advise patient to be prepared - "Two hours prior to your appointment, go ahead and check your blood pressure, pulse, oxygen saturation, and your weight (if you have the equipment to check those) and write them all down. When your visit starts, your provider will ask you for this information. If you have an Apple Watch or Kardia device, please plan to have heart rate information ready on the day of your appointment. Please have a pen and paper handy nearby the day of the visit as well."  5. Give patient instructions for MyChart download to smartphone OR Doximity/Doxy.me as below if video visit (depending on what platform provider is using)  6. Inform patient they will receive a phone call 15 minutes prior to their appointment time (may be from unknown caller ID) so they should be prepared to answer    [facility] has been deemed a candidate for a follow-up tele-health visit to limit community exposure during the Covid-19 pandemic. I spoke with the patient via phone to ensure availability of phone/video source, confirm preferred email & phone number, and discuss instructions and expectations.  I reminded Marissa Caldwell to be prepared with any vital sign and/or heart rhythm information that could potentially be obtained via home monitoring, at the time of her visit. I reminded Marissa Caldwell to expect a phone call  prior to her visit.  Harold Hedge, CMA 04/21/2019 3:05 PM   INSTRUCTIONS FOR DOWNLOADING THE MYCHART APP TO SMARTPHONE  - The patient must first make sure to have activated MyChart and know their login information - If Apple, go to [employer] and type in MyChart in the search bar and download the app. If Android, ask patient to go to [employer] and type in [place] in the search bar and download the app. The app is free but as with any other app downloads, their phone may require them to verify saved payment information  or Apple/Android password.  - The patient will need to then log into the app with their MyChart username and password, and select [facility] as their healthcare provider to link the account. When it is time for your visit, go to the MyChart app, find appointments, and click Begin Video Visit. Be sure to Select Allow for your device to access the Microphone and Camera for your visit. You will then be connected, and your provider will be with you shortly.  **If they have any issues connecting, or need assistance please contact MyChart service desk [phone])**  **If using a computer, in order to ensure the best quality for their visit they will need to use either of the following Internet Browsers: [employer], or Google Chrome**  IF USING DOXIMITY or DOXY.ME - The patient will receive a link just prior to their visit by text.     FULL LENGTH CONSENT FOR TELE-HEALTH VISIT   I hereby voluntarily request, consent and authorize [facility] and its employed or contracted physicians, physician assistants, nurse practitioners or other licensed health care professionals (the Practitioner), to provide me with telemedicine health care services (the "Services") as deemed necessary by the treating Practitioner. I acknowledge and consent to receive the Services by the Practitioner via telemedicine. I understand that the telemedicine visit will involve communicating with the Practitioner through live audiovisual communication technology and the disclosure of certain medical information by electronic transmission. I acknowledge that I have been given the opportunity to request an in-person assessment or other available alternative prior to the telemedicine visit and am voluntarily participating in the telemedicine visit.  I understand that I have the right to withhold or withdraw my consent to the use of telemedicine in the course of my care at any time, without affecting my right to future  care or treatment, and that the Practitioner or I may terminate the telemedicine visit at any time. I understand that I have the right to inspect all information obtained and/or recorded in the course of the telemedicine visit and may receive copies of available information for a reasonable fee.  I understand that some of the potential risks of receiving the Services via telemedicine include:  Marland Kitchen Delay or interruption in medical evaluation due to technological equipment failure or disruption; . Information transmitted may not be sufficient (e.g. poor resolution of images) to allow for appropriate medical decision making by the Practitioner; and/or  . In rare instances, security protocols could fail, causing a breach of personal health information.  Furthermore, I acknowledge that it is my responsibility to provide information about my medical history, conditions and care that is complete and accurate to the best of my ability. I acknowledge that Practitioner's advice, recommendations, and/or decision may be based on factors not within their control, such as incomplete or inaccurate data provided by me or distortions of diagnostic images or specimens that may result from  electronic transmissions. I understand that the practice of medicine is not an exact science and that Practitioner makes no warranties or guarantees regarding treatment outcomes. I acknowledge that I will receive a copy of this consent concurrently upon execution via email to the email address I last provided but may also request a printed copy by calling the office of [facility].    I understand that my insurance will be billed for this visit.   I have read or had this consent read to me. . I understand the contents of this consent, which adequately explains the benefits and risks of the Services being provided via telemedicine.  . I have been provided ample opportunity to ask questions regarding this consent and the Services and have  had my questions answered to my satisfaction. . I give my informed consent for the services to be provided through the use of telemedicine in my medical care  By participating in this telemedicine visit I agree to the above.

## 2019-04-21 NOTE — Telephone Encounter (Signed)
Contacted patient to discuss AVS Instructions. Gave patient Marissa Caldwell's recommendations from today's virtual office visit. Informed patient that someone from the scheduling dept will be in contact with them to schedule their follow up appt and stress test. Patient voiced understanding and AVS mailed.

## 2019-04-21 NOTE — Telephone Encounter (Signed)
   Primary Cardiologist: Skeet Latch, MD  Chart reviewed and patient contacted by phone as part of pre-operative protocol coverage. Patient was contacted 04/21/2019 in reference to pre-operative risk assessment for pending surgery as outlined below.  Marissa Caldwell was last seen on 02/27/2019 by Kerin Ransom PA.  Since that day, Marissa Caldwell has done well but has had recurrent chest pain.  Due to new or worsening symptoms, Marissa Caldwell will require a follow-up visit for further pre-operative risk assessment. A Lexiscan Myoview will be scheduled as soon as possible.    Pre-op covering staff: - Please schedule appointment and call patient to inform them. - Please contact requesting surgeon's office via preferred method (i.e, phone, fax) to inform them of need for appointment prior to surgery.  Kerin Ransom, PA-C 04/21/2019, 3:02 PM

## 2019-04-21 NOTE — Patient Instructions (Signed)
Medication Instructions:  Your physician recommends that you continue on your current medications as directed. Please refer to the Current Medication list given to you today. If you need a refill on your cardiac medications before your next appointment, please call your pharmacy.   Lab work: None  If you have labs (blood work) drawn today and your tests are completely normal, you will receive your results only by: Marland Kitchen MyChart Message (if you have MyChart) OR . A paper copy in the mail If you have any lab test that is abnormal or we need to change your treatment, we will call you to review the results.  Testing/Procedures: Your physician has requested that you have a lexiscan myoview. For further information please visit [email or web address]. Please follow instruction sheet, as given. NO MEDICATION TO HOLD  PLEASE SCHEDULE THIS TEST STAT  Follow-Up: At [facility], you and your health needs are our priority.  As part of our continuing mission to provide you with exceptional heart care, we have created designated Provider Care Teams.  These Care Teams include your primary Cardiologist (physician) and Advanced Practice Providers (APPs -  Physician Assistants and Nurse Practitioners) who all work together to provide you with the care you need, when you need it. You will need a follow up appointment in 2 months.  Please call our office 2 months in advance to schedule this appointment.  You may see Skeet Latch, MD ONLY or one of the following Advanced Practice Providers on your designated Care Team:   Kerin Ransom, PA-C Roby Lofts, [place] . Sande Rives, PA-C  Any Other Special Instructions Will Be Listed Below (If Applicable).

## 2019-04-21 NOTE — Progress Notes (Signed)
[facility] Note  04/23/2019     CHIEF COMPLAINT Patient presents for Retina Follow Up   HISTORY OF PRESENT ILLNESS: Marissa Caldwell is a 85 y.o. female who presents to the clinic today for:   HPI    Retina Follow Up    Patient presents with  Wet AMD.  In right eye.  This started 4 weeks ago.  Severity is moderate.  I, the attending physician,  performed the HPI with the patient and updated documentation appropriately.          Comments    Patient here for 4 weeks retina follow up for exu ARMD OD. Patient states vision is no different. OD hurts sometimes top part of eye. Recently dx with breast and lymph node cancer. Added tamoxifen medicine.        Last edited by Bernarda Caffey, MD on 04/23/2019  1:05 PM. (History)    pt states she has been taking tamoxifen for about a week and a half, she states she is not having any side effects   Referring physician: [name], MD [place],  [place] [address]  HISTORICAL INFORMATION:   Selected notes from the MEDICAL RECORD NUMBER Referred by Dr. Quentin Ore for concern of exudative ARMD OD LEE: 05.16.19 (C.Weaver) [BCVA: OD: 20/60 OS: 20/70] Ocular Hx-Psedudophakia OU, Glaucoma suspect, non-exudative ARMD OS, [place] PMH-HTN, Asthma    CURRENT MEDICATIONS: Current Outpatient Medications (Ophthalmic Drugs)  Medication Sig  . dorzolamide (TRUSOPT) 2 % ophthalmic solution Place 1 drop into both eyes 2 (two) times daily.  Marland Kitchen latanoprost (XALATAN) 0.005 % ophthalmic solution Place 1 drop into both eyes at bedtime.   Current Facility-Administered Medications (Ophthalmic Drugs)  Medication Route  . aflibercept (EYLEA) SOLN 2 mg Intravitreal  . aflibercept (EYLEA) SOLN 2 mg Intravitreal  . aflibercept (EYLEA) SOLN 2 mg Intravitreal  . aflibercept (EYLEA) SOLN 2 mg Intravitreal  . aflibercept (EYLEA) SOLN 2 mg Intravitreal   Current Outpatient Medications (Other)  Medication Sig   . albuterol (PROVENTIL HFA;VENTOLIN HFA) 108 (90 Base) MCG/ACT inhaler Inhale 2 puffs into the lungs every 6 (six) hours as needed (for shortness of breath/wheezing.).   Marland Kitchen aspirin 81 MG chewable tablet Chew 1 tablet (81 mg total) by mouth daily.  . budesonide-formoterol (SYMBICORT) 160-4.5 MCG/ACT inhaler Inhale 2 puffs into the lungs 2 (two) times daily.  . Calcium Carb-Cholecalciferol (CALCIUM + D3) 600-200 MG-UNIT TABS Take 1 tablet by mouth daily.  . carvedilol (COREG) 3.125 MG tablet TAKE 1 TABLET BY MOUTH TWICE DAILY  . Cholecalciferol (VITAMIN D3) 2000 units capsule Take 4,000 Units by mouth daily.  . Coenzyme Q10 (COQ10) 100 MG CAPS Take 100 mg by mouth daily.  Marland Kitchen levothyroxine (SYNTHROID, LEVOTHROID) 100 MCG tablet TAKE 1 TABLET BY MOUTH ONCE DAILY ON AN EMPTY STOMACH IN THE MORNING FOR 30 DAYS  . LORazepam (ATIVAN) 0.5 MG tablet Take 0.5 mg by mouth at bedtime.   . LUTEIN PO Take 1 tablet by mouth daily.  . Multiple Vitamin (MULTIVITAMIN) tablet Take 1 tablet by mouth daily.  . nitroGLYCERIN (NITROSTAT) 0.4 MG SL tablet Place 1 tablet (0.4 mg total) under the tongue every 5 (five) minutes as needed for chest pain.  . Omega-3 Fatty Acids (FISH OIL PO) Take 5 mLs by mouth daily.  . pravastatin (PRAVACHOL) 40 MG tablet TAKE 1 TABLET BY MOUTH DAILY. GENERIC EQUIVALENT FOR PRAVACHOL. NEW DOSE, DISCONTINUE PREVIOUS PRESCRIPTION  . tamoxifen (NOLVADEX) 20  MG tablet Take 1 tablet (20 mg total) by mouth daily.  . TOVIAZ 4 MG TB24 tablet TK 1 T PO  ONCE A DAY   Current Facility-Administered Medications (Other)  Medication Route  . Bevacizumab (AVASTIN) SOLN 1.25 mg Intravitreal  . Bevacizumab (AVASTIN) SOLN 1.25 mg Intravitreal  . Bevacizumab (AVASTIN) SOLN 1.25 mg Intravitreal      REVIEW OF SYSTEMS: ROS    Positive for: Eyes   Negative for: Constitutional, Gastrointestinal, Neurological, Skin, Genitourinary, Musculoskeletal, HENT, Endocrine, Cardiovascular, Respiratory, Psychiatric,  Allergic/Imm, Heme/Lymph   Last edited by Theodore Demark, COA on 04/23/2019  1:03 PM. (History)       ALLERGIES Allergies  Allergen Reactions  . Lipitor [Atorvastatin]     Elevated LFT's    PAST MEDICAL HISTORY Past Medical History:  Diagnosis Date  . Anxiety   . Arthritis   . CAD (coronary artery disease)    a. 03/2017: 95% LCx stenosis --> PCI/DES placed  . Cataract   . Chest pain 03/27/2017  . Chronic diastolic heart failure ([place]) 06/25/2017  . Chronic lower back pain   . COPD (chronic obstructive pulmonary disease) ([place])   . Coronary artery disease   . Diverticulitis   . Diverticulosis   . GERD (gastroesophageal reflux disease)   . Glaucoma   . Heart failure, type unknown ([place]) 03/11/2017  . Hyperlipidemia 03/11/2017  . Hypertension   . Hypothyroidism   . Pneumonia X 1   "years and years ago" (03/26/2017)  . Shortness of breath 03/11/2017  . Status post dilation of esophageal narrowing    Past Surgical History:  Procedure Laterality Date  . BILATERAL OOPHORECTOMY Bilateral   . CATARACT EXTRACTION    . CATARACT EXTRACTION W/ INTRAOCULAR LENS  IMPLANT, BILATERAL Bilateral   . CORONARY ANGIOPLASTY WITH STENT PLACEMENT  03/26/2017  . LEFT HEART CATH AND CORONARY ANGIOGRAPHY N/A 03/26/2017   Procedure: Left Heart Cath and Coronary Angiography;  Surgeon: Belva Crome, MD;  Location: [facility];  Service: Cardiovascular;  Laterality: N/A;  . NERVE SURGERY     "lump on my sciatic nerve was removed years ago"  . SHOULDER OPEN ROTATOR CUFF REPAIR Right   . THYROIDECTOMY, PARTIAL      FAMILY HISTORY Family History  Problem Relation Age of Onset  . CAD Mother   . Cataracts Mother   . Coronary artery disease Father   . Leukemia Sister   . Amblyopia Neg Hx   . Blindness Neg Hx   . Diabetes Neg Hx   . Glaucoma Neg Hx   . Macular degeneration Neg Hx   . Retinal detachment Neg Hx   . Strabismus Neg Hx   . Retinitis pigmentosa Neg Hx     SOCIAL HISTORY Social  History   Tobacco Use  . Smoking status: Current Some Day Smoker    Packs/day: 0.50    Years: 60.00    Pack years: 30.00    Types: Cigarettes  . Smokeless tobacco: Never Used  Substance Use Topics  . Alcohol use: Yes    Comment: twice a month  . Drug use: No         OPHTHALMIC EXAM:  Base Eye Exam    Visual Acuity (Snellen - Linear)      Right Left   Dist cc CF at 3' 20/40 -2   Dist ph cc NI NI   Correction: Glasses       Tonometry (Tonopen, 12:59 PM)      Right  Left   Pressure 6 11       Pupils      Dark Light Shape React APD   Right 3 2 Round Minimal None   Left 3 2 Round Minimal None       Visual Fields (Counting fingers)      Left Right    Full Full       Extraocular Movement      Right Left    Full, Ortho Full, Ortho       Neuro/Psych    Oriented x3: Yes   Mood/Affect: Normal       Dilation    Both eyes: 1.0% Mydriacyl, 2.5% Phenylephrine @ 12:59 PM        Slit Lamp and Fundus Exam    Slit Lamp Exam      Right Left   Lids/Lashes Dermatochalasis - upper lid, Telangiectasia Dermatochalasis - upper lid, Telangiectasia   Conjunctiva/Sclera White and quiet White and quiet   Cornea Arcus, 2+ diffuse Punctate epithelial erosions, diffuse endo pigment, decreased TBUT, Krukenberg's spindle Arcus, 2-3+ diffuse Punctate epithelial erosions, mild corneal haze centrally, diffuse endo pigment, decreased TBUT, Krukenberg's spindle   Anterior Chamber Deep and quiet Deep and quiet   Iris Round and dilated Round and dilated   Lens Three piece PC IOL in good position, open PC Three piece PC IOL in good position, clear PC   Vitreous Vitreous syneresis, Posterior vitreous detachment, mild vitreous condensations Mild Vitreous syneresis, Posterior vitreous detachment       Fundus Exam      Right Left   Disc sharp rim, 360 Peripapillary atrophy, temporal peripapillary heme spanning 0700-1200 360 Peripapillary atrophy, trace temporal pallor, sharp rim   C/D Ratio  0.2 0.1   Macula Blunted foveal reflex, Drusen, RPE mottling and clumping, +PED, +CNVM, sub-retinal Disciform scar, peripapillary sub-retinal heme at 0900 -- still prominent Blunted foveal reflex, Drusen, RPE mottling, clumping, and Atrophy, focal GA nasal macula   Vessels Vascular attenuation Vascular attenuation   Periphery Attached Attached        Refraction    Wearing Rx      Sphere Cylinder Axis Add   Right -1.00 +1.50 014 +2.50   Left -1.25 +1.25 174 +2.50          IMAGING AND PROCEDURES  Imaging and Procedures for @TODAY @  OCT, Retina - OU - Both Eyes       Right Eye Quality was good. Central Foveal Thickness: 359. Progression has improved. Findings include subretinal hyper-reflective material, pigment epithelial detachment, choroidal neovascular membrane, retinal drusen , epiretinal membrane, disciform scar, subretinal fluid, intraretinal fluid, abnormal foveal contour (Interval decrease in peripapillary / nasal macula IRF and SRF; persistent thick PED/SRHM/disciform scar).   Left Eye Quality was good. Central Foveal Thickness: 213. Progression has been stable. Findings include abnormal foveal contour, retinal drusen , no IRF, no SRF, outer retinal atrophy, pigment epithelial detachment.   Notes *Images captured and stored on drive  Diagnosis / Impression:  OD: Exudative AMD - Interval decrease in peripapillary / nasal macula IRF and SRF; persistent thick PED/SRHM/disciform scar OS: Non-exudative AMD w/ GA - stable from prior  Clinical management:  See below  Abbreviations: NFP - Normal foveal profile. CME - cystoid macular edema. PED - pigment epithelial detachment. IRF - intraretinal fluid. SRF - subretinal fluid. EZ - ellipsoid zone. ERM - epiretinal membrane. ORA - outer retinal atrophy. ORT - outer retinal tubulation. SRHM - subretinal hyper-reflective material  Intravitreal Injection, Pharmacologic Agent - OD - Right Eye       Time Out 04/23/2019.  12:56 PM. Confirmed correct patient, procedure, site, and patient consented.   Anesthesia Topical anesthesia was used. Anesthetic medications included Lidocaine 2%, Proparacaine 0.5%.   Procedure Preparation included 5% betadine to ocular surface, eyelid speculum. A supplied needle was used.   Injection:  2 mg aflibercept Alfonse Flavors) SOLN   NDC: [id number], Lot: [id number], Expiration date: 11/18/2019   Route: Intravitreal, Site: Right Eye, Waste: 0.05 mL  Post-op Post injection exam found visual acuity of at least counting fingers. The patient tolerated the procedure well. There were no complications. The patient received written and verbal post procedure care education.                 ASSESSMENT/PLAN:    ICD-10-CM   1. 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 Retinal edema  H35.81 OCT, Retina - OU - Both Eyes  4. Posterior vitreous detachment of both eyes  H43.813   5. Pigmentary glaucoma of both eyes, mild stage  H40.1331   6. Pseudophakia of both eyes  Z96.1     1. 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 Kathlen Mody  - S/P IVA OD #1 (05.20.19), #2 (07.02.19), #3 (07.30.19)  - S/P IVE OD #1 (08.27.19), #2 (09.24.19), #3 (11.05.19), #4 (12.17.19), #5 (02.21.20), #6 (05.05.20), # 7 (08.05.20)  - new, large peripapillary heme noted 8.5.2020  - OCT with Interval increase in IRF/SRF nasal macula; interval increase in PED/SRHM  - VA remains CF  - exam shows persistent, large, sub-retinal peripapillary hemorrhage OD -- 0900  - discussed findings and guarded prognosis  - switch in therapy to [facility] approved through [employer]  - recommend IVE OD #8  today (09.02.20) w/ f/u in 4 wks  - pt wishes to be treated with IVE OD  - RBA of procedure discussed, questions answered  - informed consent obtained and signed  - see procedure note  - Eylea paperwork and benefits investigation started on 07.30.19- Approved for 2020 [employer]  - f/u in 4 weeks -- DFE/OCT/ possible injection  2. Age related macular degeneration, non-exudative, left eye  - possible progression of atrophy on OCT today  - The incidence, anatomy, and pathology of dry AMD, risk of progression, and the AREDS and AREDS 2 study including smoking risks discussed with patient.  - continue amsler grid monitoring  3. Retinal edema as above  4. PVD / vitreous syneresis OU  Discussed findings and prognosis  No RT or RD on 360 peripheral exam  Reviewed s/s of RT/RD  Strict return precautions for any such RT/RD signs/symptoms  5. Pigmentary glaucoma OU-   - using latanoprost OU qhs  - IOP good today  - under the expert care of Dr. Quentin Ore  - monitor  6. Pseudophakia OU  - s/p CE/IOL OU  - beautiful surgery, doing well  - monitor   Ophthalmic Meds Ordered this visit:  Meds ordered this encounter  Medications  . aflibercept (EYLEA) SOLN 2 mg       Return in about 4 weeks (around 05/21/2019) for Dilated Exam, OCT, Possible Injxn.  There are no  Patient Instructions on file for this visit.   Explained the diagnoses, plan, and follow up with the patient and they expressed understanding.  Patient expressed understanding of the importance of proper follow up care.   This document serves as a record of services personally performed by Gardiner Sleeper, MD, PhD. It was created on their behalf by Roselee Nova, COMT. The creation of this record is the provider's dictation and/or activities during the visit.  Electronically signed by: Roselee Nova, COMT 04/23/19 11:43 PM   This document serves as a record of services personally performed by Gardiner Sleeper, MD, PhD. It was  created on their behalf by Ernest Mallick, OA, an ophthalmic assistant. The creation of this record is the provider's dictation and/or activities during the visit.    Electronically signed by: Ernest Mallick, OA  09.02.2020 11:43 PM   Gardiner Sleeper, M.D., Ph.D. Diseases & Surgery of the Retina and Vitreous [facility]  I have reviewed the above documentation for accuracy and completeness, and I agree with the above. Gardiner Sleeper, M.D., Ph.D. 04/23/19 11:43 PM   Abbreviations: M myopia (nearsighted); A astigmatism; H hyperopia (farsighted); P presbyopia; Mrx spectacle prescription;  CTL contact lenses; OD right eye; OS left eye; OU both eyes  XT exotropia; ET esotropia; PEK punctate epithelial keratitis; PEE punctate epithelial erosions; DES dry eye syndrome; MGD meibomian gland dysfunction; ATs artificial tears; PFAT's preservative free artificial tears; [place] nuclear sclerotic cataract; PSC posterior subcapsular cataract; ERM epi-retinal membrane; PVD posterior vitreous detachment; RD retinal detachment; DM diabetes mellitus; DR diabetic retinopathy; NPDR non-proliferative diabetic retinopathy; PDR proliferative diabetic retinopathy; CSME clinically significant macular edema; DME diabetic macular edema; dbh dot blot hemorrhages; CWS cotton wool spot; POAG primary open angle glaucoma; C/D cup-to-disc ratio; HVF humphrey visual field; GVF goldmann visual field; OCT optical coherence tomography; IOP intraocular pressure; BRVO Branch retinal vein occlusion; CRVO central retinal vein occlusion; CRAO central retinal artery occlusion; BRAO branch retinal artery occlusion; RT retinal tear; SB scleral buckle; PPV pars plana vitrectomy; VH Vitreous hemorrhage; PRP panretinal laser photocoagulation; IVK intravitreal kenalog; VMT vitreomacular traction; MH Macular hole;  NVD neovascularization of the disc; NVE neovascularization elsewhere; AREDS age related eye disease study; ARMD age related  macular degeneration; POAG primary open angle glaucoma; EBMD epithelial/anterior basement membrane dystrophy; ACIOL anterior chamber intraocular lens; IOL intraocular lens; PCIOL posterior chamber intraocular lens; Phaco/IOL phacoemulsification with intraocular lens placement; [place] photorefractive keratectomy; LASIK laser assisted in situ keratomileusis; HTN hypertension; DM diabetes mellitus; COPD chronic obstructive pulmonary disease

## 2019-04-23 ENCOUNTER — Ambulatory Visit (INDEPENDENT_AMBULATORY_CARE_PROVIDER_SITE_OTHER): Payer: Medicare Other | Admitting: Ophthalmology

## 2019-04-23 ENCOUNTER — Other Ambulatory Visit: Payer: Self-pay

## 2019-04-23 ENCOUNTER — Encounter (INDEPENDENT_AMBULATORY_CARE_PROVIDER_SITE_OTHER): Payer: Self-pay | Admitting: Ophthalmology

## 2019-04-23 DIAGNOSIS — H3581 Retinal edema: Secondary | ICD-10-CM

## 2019-04-23 DIAGNOSIS — H353211 Exudative age-related macular degeneration, right eye, with active choroidal neovascularization: Secondary | ICD-10-CM | POA: Diagnosis not present

## 2019-04-23 DIAGNOSIS — H353122 Nonexudative age-related macular degeneration, left eye, intermediate dry stage: Secondary | ICD-10-CM

## 2019-04-23 DIAGNOSIS — Z961 Presence of intraocular lens: Secondary | ICD-10-CM

## 2019-04-23 DIAGNOSIS — H43813 Vitreous degeneration, bilateral: Secondary | ICD-10-CM

## 2019-04-23 DIAGNOSIS — H401331 Pigmentary glaucoma, bilateral, mild stage: Secondary | ICD-10-CM

## 2019-04-23 MED ORDER — AFLIBERCEPT 2MG/0.05ML IZ SOLN FOR KALEIDOSCOPE
2.0000 mg | INTRAVITREAL | Status: AC | PRN
  Administered 2019-04-23: 2 mg via INTRAVITREAL

## 2019-04-29 NOTE — Progress Notes (Signed)
[facility] Psychosocial Distress Screening Clinical Social Work  Clinical Social Work was referred by distress screening protocol.  The patient scored a 8 on the Psychosocial Distress Thermometer which indicates severe distress. Clinical Social Worker met with patient in exam room to assess for distress and other psychosocial needs.  ONCBCN DISTRESS SCREENING 04/29/2019  Distress experienced in past week (1-10) 8  Emotional problem type Nervousness/Anxiety  Spiritual/Religous concerns type Facing my mortality  Information Concerns Type Lack of info about treatment;Lack of info about diagnosis  Referral to support programs Yes   Clinical Social Worker follow up needed: No.  Counseling Intern Notes: I spoke to the patient today via phone. She stated that she is doing "OK" at present, but finds it is hard to talk to anyone outside of her family about her illness. She reported that she has support locally from her family, who is up-to-date and involved with her treatment.  The patient stated that she is waiting to find out more about her diagnosis and treatment at her next appointment and will reconsider her need for support at that point.  She declined one-on-one counseling services at the present time.  I will check-in with the patient in 3 weeks to offer emotional and mental health support if needed at that time.    [place] Counseling Intern  Voicemail: [phone]

## 2019-05-06 ENCOUNTER — Telehealth (HOSPITAL_COMMUNITY): Payer: Self-pay

## 2019-05-06 NOTE — Telephone Encounter (Signed)
Spoke with the patient, instructions given. She stated that she would be here for her test. Asked to call back with any questions. S.Nikiya Starn EMTP 

## 2019-05-08 ENCOUNTER — Ambulatory Visit (HOSPITAL_COMMUNITY): Payer: Medicare Other | Attending: Internal Medicine

## 2019-05-08 ENCOUNTER — Other Ambulatory Visit: Payer: Self-pay

## 2019-05-08 VITALS — Ht 69.0 in | Wt 143.0 lb

## 2019-05-08 DIAGNOSIS — Z01818 Encounter for other preprocedural examination: Secondary | ICD-10-CM | POA: Insufficient documentation

## 2019-05-08 DIAGNOSIS — Z9861 Coronary angioplasty status: Secondary | ICD-10-CM | POA: Insufficient documentation

## 2019-05-08 DIAGNOSIS — R079 Chest pain, unspecified: Secondary | ICD-10-CM | POA: Diagnosis not present

## 2019-05-08 DIAGNOSIS — I2510 Atherosclerotic heart disease of native coronary artery without angina pectoris: Secondary | ICD-10-CM | POA: Insufficient documentation

## 2019-05-08 LAB — MYOCARDIAL PERFUSION IMAGING
LV dias vol: 61 mL (ref 46–106)
LV sys vol: 20 mL
Peak HR: 86 {beats}/min
Rest HR: 66 {beats}/min
SDS: 2
SRS: 2
SSS: 4
TID: 1.06

## 2019-05-08 MED ORDER — TECHNETIUM TC 99M TETROFOSMIN IV KIT
32.5000 | PACK | Freq: Once | INTRAVENOUS | Status: AC | PRN
  Administered 2019-05-08: 32.5 via INTRAVENOUS
  Filled 2019-05-08: qty 33

## 2019-05-08 MED ORDER — REGADENOSON 0.4 MG/5ML IV SOLN
0.4000 mg | Freq: Once | INTRAVENOUS | Status: AC
  Administered 2019-05-08: 0.4 mg via INTRAVENOUS

## 2019-05-08 MED ORDER — TECHNETIUM TC 99M TETROFOSMIN IV KIT
10.0000 | PACK | Freq: Once | INTRAVENOUS | Status: AC | PRN
  Administered 2019-05-08: 10 via INTRAVENOUS
  Filled 2019-05-08: qty 10

## 2019-05-09 ENCOUNTER — Ambulatory Visit: Payer: Self-pay | Admitting: General Surgery

## 2019-05-09 DIAGNOSIS — Z170 Estrogen receptor positive status [ER+]: Secondary | ICD-10-CM

## 2019-05-09 DIAGNOSIS — C50411 Malignant neoplasm of upper-outer quadrant of right female breast: Secondary | ICD-10-CM

## 2019-05-09 NOTE — Telephone Encounter (Signed)
   Primary Cardiologist: Skeet Latch, MD  Chart reviewed and patient seen and examined as part of pre-operative protocol coverage. Stress test was low risk. Given past medical history and time since last visit, based on ACC/AHA guidelines, Marissa Caldwell would be at acceptable risk for the planned procedure without further cardiovascular testing.   I will route this recommendation to the requesting party via Epic fax function and remove from pre-op pool.  Please call with questions.  Kerin Ransom, PA-C 05/09/2019, 11:45 AM

## 2019-05-16 ENCOUNTER — Other Ambulatory Visit: Payer: Self-pay | Admitting: Cardiovascular Disease

## 2019-05-23 ENCOUNTER — Telehealth: Payer: Self-pay | Admitting: Hematology

## 2019-05-23 NOTE — Telephone Encounter (Signed)
Scheduled appt per 10/1 sch message - pt son is aware of appt date and time

## 2019-05-26 ENCOUNTER — Telehealth: Payer: Self-pay | Admitting: Hematology

## 2019-05-26 NOTE — Telephone Encounter (Signed)
I got a message from our breast navigator Dawn, that patient may not want to have surgery.  She is currently scheduled to have breast surgery with Dr. Marlou Starks on June 19, 2019.  I called her son Josph Macho.  Patient is 85 year old, legally blind, with a hearing loss.  She has told her son that she does not want surgery or radiation.  She is tolerating tamoxifen well.  I discussed the nonsurgical options for breast cancer treatment with Josph Macho, which including antiestrogen therapy, radiation for palliation if she develops cancer progression, with local wound, bleeding, or pain issue.  We discussed that these therapies are not curative, and she may have cancer progression when she is on tamoxifen.  However, she may respond well to tamoxifen, and her cancer may be controlled for years, in the best scenario.  Given her advanced age, and significant comorbidities, I think tamoxifen alone is a reasonable option for her if she does not want surgery. Josph Macho will discuss with his mother again, and call us if she decides to cancel her surgery.  Patient has a follow-up appointment with me in 1 month, but will come in with her his mother for her next appointment. He appreciate my call today.   [name]  05/26/2019

## 2019-05-29 ENCOUNTER — Telehealth: Payer: Self-pay | Admitting: *Deleted

## 2019-05-29 NOTE — Progress Notes (Signed)
Marissa Caldwell   [phone] [phone]   Clinic Follow up Note   Patient Care Team: [name], MD as PCP - General Skeet Latch, MD as PCP - Cardiology (Cardiology) Rockwell Germany, RN as Oncology Nurse Navigator Mauro Kaufmann, RN as Oncology Nurse Navigator Jovita Kussmaul, MD as Consulting Physician (General Surgery) [name], MD as Consulting Physician (Hematology) Eppie Gibson, MD as Attending Physician (Radiation Oncology)  Date of Service:  05/30/2019  CHIEF COMPLAINT: F/u of right breast cancer  SUMMARY OF ONCOLOGIC HISTORY: Oncology History Overview Note  Cancer Staging Malignant neoplasm of upper-outer quadrant of right breast in female, estrogen receptor positive ([place]) Staging form: Breast, AJCC 8th Edition - Clinical: Stage IIA (cT1c, cN1, cM0, G3, ER+, PR+, HER2-) - Signed by [name], MD on 04/09/2019    Malignant neoplasm of upper-outer quadrant of right breast in female, estrogen receptor positive ([place])  03/19/2019 Mammogram   Diagnostic Mammogram 03/19/19  IMPRESSION The 2 cm distortion in the  right breast 9:30 position middle depth 3cm from the nipple is indeterminate. [place] recommended.  There is also a 1.2cmx3.2cm enlarged right axillary LN highly suggestive of malignancy.     03/31/2019 Initial Biopsy   Diagnosis 03/31/19  1. Breast, right, needle core biopsy, 9:30 o'clock, mid depth, 3cmfn - INVASIVE DUCTAL CARCINOMA. - LYMPHOVASCULAR INVASION IS IDENTIFIED. - SEE COMMENT. 2. Lymph node, needle/core biopsy, right axilla - INVASIVE DUCTAL CARCINOMA. - SEE COMMENT.   03/31/2019 Receptors her2   1. PROGNOSTIC INDICATORS Results: IMMUNOHISTOCHEMICAL AND MORPHOMETRIC ANALYSIS PERFORMED MANUALLY The tumor cells are NEGATIVE for Her2 (0). Estrogen Receptor: 100%, POSITIVE, STRONG STAINING INTENSITY Progesterone Receptor: 10%, POSITIVE, STRONG STAINING INTENSITY Proliferation Marker Ki67: 10%  2. PROGNOSTIC  INDICATORS Results: IMMUNOHISTOCHEMICAL AND MORPHOMETRIC ANALYSIS PERFORMED MANUALLY Estrogen Receptor: 100%, POSITIVE, STRONG STAINING INTENSITY Progesterone Receptor: 0%, NEGATIVE Proliferation Marker Ki67: 40%   04/03/2019 Initial Diagnosis   Malignant neoplasm of upper-outer quadrant of right breast in female, estrogen receptor positive ([place])   04/09/2019 Cancer Staging   Staging form: Breast, AJCC 8th Edition - Clinical: Stage IIA (cT1c, cN1, cM0, G3, ER+, PR+, HER2-) - Signed by [name], MD on 04/09/2019  Staging form: Breast, AJCC 8th Edition - Clinical: Stage IIA (cT1c, cN1, cM0, G3, ER+, PR+, HER2-) - Signed by [name], MD on 04/09/2019      CURRENT THERAPY:  Tamoxifen 32m daily starting 03/2019. Will hold starting 05/30/19 for surgery  PENDING Surgery   INTERVAL HISTORY:  Marissa Caldwell is here for a follow up. She presents to the clinic with her son. She can still hear some. She has been taking Tamoxifen and tolerating well. She notes right nipple soreness. She notes her concerns about surgery. She is concerned about pain and her arm ROM and use post surgery. She notes after she takes thyroid medication her BP will be high.    REVIEW OF SYSTEMS:   Constitutional: Denies fevers, chills or abnormal weight loss Eyes: Denies blurriness of vision Ears, nose, mouth, throat, and face: Denies mucositis or sore throat Respiratory: Denies cough, dyspnea or wheezes Cardiovascular: Denies palpitation, chest discomfort or lower extremity swelling Gastrointestinal:  Denies nausea, heartburn or change in bowel habits Skin: Denies abnormal skin rashes Lymphatics: Denies new lymphadenopathy or easy bruising Neurological:Denies numbness, tingling or new weaknesses Behavioral/Psych: Mood is stable, no new changes  All other systems were reviewed with the patient and are negative.  MEDICAL HISTORY:  Past Medical History:  Diagnosis Date  .  Anxiety   . Arthritis   . CAD (coronary  artery disease)    a. 03/2017: 95% LCx stenosis --> PCI/DES placed  . Cataract   . Chest pain 03/27/2017  . Chronic diastolic heart failure ([place]) 06/25/2017  . Chronic lower back pain   . COPD (chronic obstructive pulmonary disease) ([place])   . Coronary artery disease   . Diverticulitis   . Diverticulosis   . GERD (gastroesophageal reflux disease)   . Glaucoma   . Heart failure, type unknown ([place]) 03/11/2017  . Hyperlipidemia 03/11/2017  . Hypertension   . Hypothyroidism   . Pneumonia X 1   "years and years ago" (03/26/2017)  . Shortness of breath 03/11/2017  . Status post dilation of esophageal narrowing     SURGICAL HISTORY: Past Surgical History:  Procedure Laterality Date  . BILATERAL OOPHORECTOMY Bilateral   . CATARACT EXTRACTION    . CATARACT EXTRACTION W/ INTRAOCULAR LENS  IMPLANT, BILATERAL Bilateral   . CORONARY ANGIOPLASTY WITH STENT PLACEMENT  03/26/2017  . LEFT HEART CATH AND CORONARY ANGIOGRAPHY N/A 03/26/2017   Procedure: Left Heart Cath and Coronary Angiography;  Surgeon: Belva Crome, MD;  Location: [facility];  Service: Cardiovascular;  Laterality: N/A;  . NERVE SURGERY     "lump on my sciatic nerve was removed years ago"  . SHOULDER OPEN ROTATOR CUFF REPAIR Right   . THYROIDECTOMY, PARTIAL      I have reviewed the social history and family history with the patient and they are unchanged from previous note.  ALLERGIES:  is allergic to lipitor [atorvastatin].  MEDICATIONS:  Current Outpatient Medications  Medication Sig Dispense Refill  . albuterol (PROVENTIL HFA;VENTOLIN HFA) 108 (90 Base) MCG/ACT inhaler Inhale 2 puffs into the lungs every 6 (six) hours as needed (for shortness of breath/wheezing.).     Marland Kitchen aspirin 81 MG chewable tablet Chew 1 tablet (81 mg total) by mouth daily.    . brimonidine (ALPHAGAN) 0.2 % ophthalmic solution Place 0.2 drops into the left eye daily.    . budesonide-formoterol (SYMBICORT) 160-4.5 MCG/ACT inhaler Inhale 2 puffs into  the lungs 2 (two) times daily.    . Calcium Carb-Cholecalciferol (CALCIUM + D3) 600-200 MG-UNIT TABS Take 1 tablet by mouth daily.    . carvedilol (COREG) 3.125 MG tablet TAKE 1 TABLET BY MOUTH TWICE DAILY 180 tablet 3  . Cholecalciferol (VITAMIN D3) 2000 units capsule Take 4,000 Units by mouth daily.    . Coenzyme Q10 (COQ10) 100 MG CAPS Take 100 mg by mouth daily.    . dorzolamide (TRUSOPT) 2 % ophthalmic solution Place 1 drop into both eyes 2 (two) times daily.    Marland Kitchen latanoprost (XALATAN) 0.005 % ophthalmic solution Place 1 drop into both eyes at bedtime.    Marland Kitchen levothyroxine (SYNTHROID, LEVOTHROID) 100 MCG tablet TAKE 1 TABLET BY MOUTH ONCE DAILY ON AN EMPTY STOMACH IN THE MORNING FOR 30 DAYS  0  . LORazepam (ATIVAN) 0.5 MG tablet Take 0.5 mg by mouth at bedtime.     . LUTEIN PO Take 1 tablet by mouth daily.    . Multiple Vitamin (MULTIVITAMIN) tablet Take 1 tablet by mouth daily.    . nitroGLYCERIN (NITROSTAT) 0.4 MG SL tablet Place 1 tablet (0.4 mg total) under the tongue every 5 (five) minutes as needed for chest pain. 25 tablet 3  . Omega-3 Fatty Acids (FISH OIL PO) Take 5 mLs by mouth daily.    . pravastatin (PRAVACHOL) 40 MG tablet TAKE 1 TABLET  BY MOUTH DAILY. GENERIC EQUIVALENT FOR PRAVACHOL. NEW DOSE, DISCONTINUE PREVIOUS PRESCRIPTION 90 tablet 1  . tamoxifen (NOLVADEX) 20 MG tablet Take 1 tablet (20 mg total) by mouth daily. 30 tablet 3  . TOVIAZ 4 MG TB24 tablet TK 1 T PO  ONCE A DAY  1   Current Facility-Administered Medications  Medication Dose Route Frequency Provider Last Rate Last Dose  . aflibercept (EYLEA) SOLN 2 mg  2 mg Intravitreal  Bernarda Caffey, MD   2 mg at 04/16/18 1424  . aflibercept (EYLEA) SOLN 2 mg  2 mg Intravitreal  Bernarda Caffey, MD   2 mg at 05/14/18 1358  . aflibercept (EYLEA) SOLN 2 mg  2 mg Intravitreal  Bernarda Caffey, MD   2 mg at 06/25/18 1433  . aflibercept (EYLEA) SOLN 2 mg  2 mg Intravitreal  Bernarda Caffey, MD   2 mg at 08/07/18 1557  . aflibercept  (EYLEA) SOLN 2 mg  2 mg Intravitreal  Bernarda Caffey, MD   2 mg at 10/11/18 1522  . Bevacizumab (AVASTIN) SOLN 1.25 mg  1.25 mg Intravitreal  Bernarda Caffey, MD   1.25 mg at 01/07/18 1429  . Bevacizumab (AVASTIN) SOLN 1.25 mg  1.25 mg Intravitreal  Bernarda Caffey, MD   1.25 mg at 02/19/18 1356  . Bevacizumab (AVASTIN) SOLN 1.25 mg  1.25 mg Intravitreal  Bernarda Caffey, MD   1.25 mg at 03/19/18 1347    PHYSICAL EXAMINATION: ECOG PERFORMANCE STATUS: 1 - Symptomatic but completely ambulatory  Vitals:   05/30/19 1137  BP: 128/70  Pulse: 74  Resp: 17  Temp: 98.2 F (36.8 C)  SpO2: 96%   Filed Weights   05/30/19 1137  Weight: 142 lb 11.2 oz (64.7 kg)    GENERAL:alert, no distress and comfortable SKIN: skin color, texture, turgor are normal, no rashes or significant lesions EYES: normal, Conjunctiva are pink and non-injected, sclera clear  NECK: supple, thyroid normal size, non-tender, without nodularity LYMPH:  no palpable lymphadenopathy in the cervical, axillary  LUNGS: clear to auscultation and percussion with normal breathing effort HEART: regular rate & rhythm and no murmurs and no lower extremity edema ABDOMEN:abdomen soft, non-tender and normal bowel sounds Musculoskeletal:no cyanosis of digits and no clubbing  NEURO: alert & oriented x 3 with fluent speech, no focal motor/sensory deficits BREAST: (+) 3cm right axiliary LN, movable (+) new 1.5cm palpable right axillary LN (+) Now 3x2cm (previously 2-2.5cm) mass in 9:30 position UOQ of right breast, softer. Left breast exam benign.   LABORATORY DATA:  I have reviewed the data as listed CBC Latest Ref Rng & Units 04/09/2019 03/26/2017 03/21/2017  WBC 4.0 - 10.5 K/uL 6.0 5.6 5.6  Hemoglobin 12.0 - 15.0 g/dL 13.6 12.0 13.1  Hematocrit 36.0 - 46.0 % 41.6 36.3 38.1  Platelets 150 - 400 K/uL 280 270 282     CMP Latest Ref Rng & Units 04/09/2019 02/19/2019 08/28/2018  Glucose 70 - 99 mg/dL 86 92 -  BUN 8 - 23 mg/dL 19 19 -  Creatinine 0.44  - 1.00 mg/dL 0.77 0.70 -  Sodium 135 - 145 mmol/L 139 140 -  Potassium 3.5 - 5.1 mmol/L 4.4 4.5 -  Chloride 98 - 111 mmol/L 104 101 -  CO2 22 - 32 mmol/L 26 22 -  Calcium 8.9 - 10.3 mg/dL 9.8 9.7 -  Total Protein 6.5 - 8.1 g/dL 7.5 6.8 6.9  Total Bilirubin 0.3 - 1.2 mg/dL 0.3 0.3 0.4  Alkaline Phos 38 - 126 U/L 80 74  78  AST 15 - 41 U/L 26 27 30   ALT 0 - 44 U/L 15 11 21       RADIOGRAPHIC STUDIES: I have personally reviewed the radiological images as listed and agreed with the findings in the report. No results found.   ASSESSMENT & PLAN:  Marissa Caldwell is a 85 y.o. female with    1. Malignant neoplasm of upper-outer quadrant of right breast, invasive ductal carcinoma, Stage IIA, c(T1cN1M0), ER/PR+, HER2-, Grade III -She was diagnosed in 03/2019. She has invasive ductal carcinoma which has spread to her local LN.  -Standard treatment is surgery which is the only way to be cured. Given her advanced age, comorbidities (especially her CAD and COPD),  Dr. Marlou Starks checked with her cardiologist who cleared her for surgery. She however has been very reluctant to have surgery.  -While waiting for surgery I started her on Tamoxifen 03/2019. She has been tolerating well.  -Her physical exam today shows both her breast mass and right axillary lymph node has increased in size, and she has a new palpable lymph nodes in the right axilla. Given her grade 3 disease, her cancer could be aggressive -I discussed it takes minimal 3-6 months of Tamoxifen to start taking effect and she has only been on it for 2 months. II am concerned Tamoxifen alone is not controlling her disease and I strongly suggest surgery which is scheduled for the end of this month -the cure rate by surgery alone is good, probably above 80% -given her advanced age and medical comorbidities, I think it's reasonable to skip adjuvant radiation  -given her strong ER/PR positive disease and good tolerance to tamoxifen, I encouraged her to  continue tamoxifen after surgery.  Due to the small risk of thrombosis from tamoxifen, I recommend her to stop tamoxifen now (3 weeks before surgery), I will see her back in a months after surgery, if she recovers well, will restart her tamoxifen at that time. -I discussed all her and her son's questions and concerns about surgery. After a lengthy discussion she opted to have surgery which is scheduled for 06/19/19.  -F/u 1 month after surgery    2. CAD, HTN, Thyroid dysfunction -She has had a cardiac stent placed in the past  -She is on Coreg, nitro, pravastatin and other meds for her heart and thyroid medication -She will continue to be closely followed by her cardiologist.  -Her son notes her BP is very high after she takes thyroid medication. I encouraged her to notify her PCP.    3. COPD, Long Term smoking  -On inhalers, not on home oxygen  -She still smokes occasionally. She has not completely quit.    4. Underweight  -After drastic change in diet she had lowered appetite and lost significant weight in the last year. She has started to gain some of that weight back.  -She is still able to be independent and take care of herself and her home. She lives alone with good support and help from her son.  -I encouraged her to continue adequate nutrition and gaining weight.  -She is on Mirtazapine for appetite and sleep   5. Osteoporosis, Arthritis  -She receives Prolia injections q12month with Dr. WDema Severin  -She had fracture of her wrists in 2019    -Her joint pain is mainly in her left hand and lower back when she stands for a long time.    6. Macular Degeneration of both eyes, hearing loss -Managed by her Ophthalmologist  7. Anxiety/depression  -On Zoloft, mood stable  -We discussed that zoloft has very mild interaction with Tamoxifen, will monitor for now     PLAN:  -After lengthy discussion, patient finally agreed to proceed with surgery.   -Hold Tamoxifen for  now due to upcoming surgery  -Proceed with surgery on 10/29 -F/u 1 month after surgery, will probably restart tamoxifen on next visit  No problem-specific Assessment & Plan notes found for this encounter.   No orders of the defined types were placed in this encounter.  All questions were answered. The patient knows to call the clinic with any problems, questions or concerns. No barriers to learning was detected. I spent 20 minutes counseling the patient face to face. The total time spent in the appointment was 25 minutes and more than 50% was on counseling and review of test results     [name], MD 05/30/2019   I, Joslyn Devon, am acting as scribe for [name], MD.   I have reviewed the above documentation for accuracy and completeness, and I agree with the above.

## 2019-05-29 NOTE — Telephone Encounter (Signed)
Spoke to son Josph Macho. Pt would like to see Dr Burr Medico and discuss sx vs AI/observation. Scheduled and confirmed appt for 10/9 at 10:40.

## 2019-05-30 ENCOUNTER — Other Ambulatory Visit: Payer: Self-pay

## 2019-05-30 ENCOUNTER — Inpatient Hospital Stay: Payer: Medicare Other | Attending: Hematology | Admitting: Hematology

## 2019-05-30 ENCOUNTER — Encounter: Payer: Self-pay | Admitting: Hematology

## 2019-05-30 VITALS — BP 128/70 | HR 74 | Temp 98.2°F | Resp 17 | Ht 69.0 in | Wt 142.7 lb

## 2019-05-30 DIAGNOSIS — Z79810 Long term (current) use of selective estrogen receptor modulators (SERMs): Secondary | ICD-10-CM | POA: Diagnosis not present

## 2019-05-30 DIAGNOSIS — E039 Hypothyroidism, unspecified: Secondary | ICD-10-CM | POA: Insufficient documentation

## 2019-05-30 DIAGNOSIS — I2510 Atherosclerotic heart disease of native coronary artery without angina pectoris: Secondary | ICD-10-CM

## 2019-05-30 DIAGNOSIS — J449 Chronic obstructive pulmonary disease, unspecified: Secondary | ICD-10-CM | POA: Diagnosis not present

## 2019-05-30 DIAGNOSIS — C50411 Malignant neoplasm of upper-outer quadrant of right female breast: Secondary | ICD-10-CM | POA: Diagnosis not present

## 2019-05-30 DIAGNOSIS — Z170 Estrogen receptor positive status [ER+]: Secondary | ICD-10-CM | POA: Insufficient documentation

## 2019-05-30 DIAGNOSIS — Z9861 Coronary angioplasty status: Secondary | ICD-10-CM

## 2019-05-30 DIAGNOSIS — Z79899 Other long term (current) drug therapy: Secondary | ICD-10-CM | POA: Insufficient documentation

## 2019-06-02 ENCOUNTER — Telehealth: Payer: Self-pay | Admitting: Hematology

## 2019-06-02 NOTE — Telephone Encounter (Signed)
Scheduled appt per 10/9 los.  Sent a staff message to get a calendar mailed out.

## 2019-06-09 ENCOUNTER — Other Ambulatory Visit: Payer: Medicare Other

## 2019-06-09 ENCOUNTER — Ambulatory Visit: Payer: Medicare Other | Admitting: Hematology

## 2019-06-10 ENCOUNTER — Inpatient Hospital Stay: Payer: Medicare Other

## 2019-06-10 NOTE — Progress Notes (Signed)
Nutrition Assessment   Reason for Assessment:  Patient identified on Malnutrition Screening report for poor appetite and weight loss   ASSESSMENT:  85 year old female with right breast cancer followed by Dr. Burr Medico.  Past medical history of CAD, CHF, COPD, CAD, GERD, HLD, HTN, dilation of esophagus.  Planning lumpectomy on 10/29.    Spoke with patient via phone this am to introduce self and service at [facility].  Patient reports that she does not have much of an appetite, "nothing appeals to me."  Reports that she is still tries to eat as much as she can.  Reports that she is drinking ensure plus BID.  Typically has yogurt and ensure plus for breakfast.  She then eats around 2-3 pm a salad with meat sandwich or frozen dinner.  In the evening snacks on frozen fruit, trail mix, english muffin with butter, cake.  Does not really have an evening meal.  Patient lives alone.  Reports that grand-daughter recently brought her over some frozen meals but she does not eat too much of them due to sodium content.      Nutrition Focused Physical Exam: deferred   Medications: remeron, MVI, omega 3, synthroid, ativan   Labs: reviewed   Anthropometrics:   Height: 69 inches Weight: 142 lb 11.2 oz 10/9 UBW: 150s per patient.  Noted 151 lb 12/2017 BMI: 21  Patient reports weight loss after having stent placed.    6% weight loss over the last year and 5 months, not significant     Estimated Energy Needs  Kcals: 1600-1920 Protein: 80-96 g Fluid: > 1.6 L   NUTRITION DIAGNOSIS: Increased nutrient needs related to cancer, upcoming surgery as evidenced by estimated needs and concerning due to poor appetite/lack of desire to eat   INTERVENTION:  Discussed ways to increase calories and protein in current eating pattern.  Encouraged patient to continue ensure plus BID for added nutrition.  Contact information given and patient will call RD if weight or appetite changes.    MONITORING, EVALUATION, GOAL:  Patient will consume adequate calories and protein to maintain weight during treatment   Next Visit: Patient to call  Janille Draughon B. Zenia Resides, [place], [place] Registered Dietitian [phone] (pager)

## 2019-06-12 ENCOUNTER — Encounter (HOSPITAL_COMMUNITY): Payer: Self-pay

## 2019-06-13 ENCOUNTER — Other Ambulatory Visit (HOSPITAL_COMMUNITY): Payer: Medicare Other

## 2019-06-13 ENCOUNTER — Inpatient Hospital Stay (HOSPITAL_COMMUNITY)
Admission: RE | Admit: 2019-06-13 | Discharge: 2019-06-13 | Disposition: A | Discharge status: Home / Self Care | Payer: Medicare Other | Source: Ambulatory Visit

## 2019-06-13 NOTE — Progress Notes (Signed)
Pt was scheduled for a PAT appt at 1 PM today. She was a no-show, spoke with her son, Clementeen Graham and he stated that the "paper work said the appt was for Monday, the 26th". Pt has been rescheduled to Monday, 06/16/19 at 1:00 PM and Mr. Ridge's wife took the information and voiced understanding.

## 2019-06-13 NOTE — Pre-Procedure Instructions (Signed)
Marissa Caldwell  06/13/2019    Your procedure is scheduled on Thursday, June 19, 2019 at 11:00 AM.   Report to [facility] Entrance "A" Admitting Office at 9:00 AM.   Call this number if you have problems the morning of surgery: [phone]   Questions prior to day of surgery, please call [phone] between 8 & 4 PM.   Remember:  Do not eat food after midnight.  You may drink clear liquids until 8:00 AM.  Clear liquids allowed are:  Water, Juice (non-citric and without pulp), Carbonated beverages, Clear Tea, Black Coffee only, Plain Jell-O only, Gatorade and Plain Popsicles only    Take these medicines the morning of surgery with A SIP OF WATER: Carvedilol (Coreg), Levothyroxine (Synthroid), Toviaz, Nitroglycerin - if needed, Symbicort inhaler, Albuterol inhaler - if needed (bring this inhaler with you day of surgery), eye drops  Stop Aspirin as instructed by surgeon/physician. Stop Multivitamins, Coenzyme Q 10 and Lutein as of today prior to surgery. Do not use NSAIDS (Ibuprofen, Aleve, etc) or other Aspirin products (BC Powders, Goody's, etc) prior to surgery.  Do NOT smoke 24 hours prior to surgery.    Do not wear jewelry, make-up or nail polish.  Do not wear lotions, powders, perfumes or deodorant.  Do not shave 48 hours prior to surgery.    Do not bring valuables to the hospital.  [facility] is not responsible for any belongings or valuables.  Contacts, dentures or bridgework may not be worn into surgery.  Leave your suitcase in the car.  After surgery it may be brought to your room.  For patients admitted to the hospital, discharge time will be determined by your treatment team.  Patients discharged the day of surgery will not be allowed to drive home.   [facility] - Preparing for Surgery  Before surgery, you can play an important role.  Because skin is not sterile, your skin needs to be as free of germs as possible.  You can reduce the number of germs on  you skin by washing with CHG (chlorahexidine gluconate) soap before surgery.  CHG is an antiseptic cleaner which kills germs and bonds with the skin to continue killing germs even after washing.  Oral Hygiene is also important in reducing the risk of infection.  Remember to brush your teeth with your regular toothpaste the morning of surgery.  Please DO NOT use if you have an allergy to CHG or antibacterial soaps.  If your skin becomes reddened/irritated stop using the CHG and inform your nurse when you arrive at Short Stay.  Do not shave (including legs and underarms) for at least 48 hours prior to the first CHG shower.  You may shave your face.  Please follow these instructions carefully:   1.  Shower with CHG Soap the night before surgery and the morning of Surgery.  2.  If you choose to wash your hair, wash your hair first as usual with your normal shampoo.  3.  After you shampoo, rinse your hair and body thoroughly to remove the shampoo. 4.  Use CHG as you would any other liquid soap.  You can apply chg directly to the skin and wash gently with a      scrungie or washcloth.           5.  Apply the CHG Soap to your body ONLY FROM THE NECK DOWN.   Do not use on open wounds or open sores. Avoid contact with your  eyes, ears, mouth and genitals (private parts).  Wash genitals (private parts) with your normal soap - do this prior to using CHG soap.  6.  Wash thoroughly, paying special attention to the area where your surgery will be performed.  7.  Thoroughly rinse your body with warm water from the neck down.  8.  DO NOT shower/wash with your normal soap after using and rinsing off the CHG Soap.  9.  Pat yourself dry with a clean towel.            10.  Wear clean pajamas.            11.  Place clean sheets on your bed the night of your first shower and do not sleep with pets.  Day of Surgery  Shower as above. Do not apply any lotions/deodorants the morning of surgery.   Please wear clean  clothes to the hospital. Remember to brush your teeth with toothpaste.  Please read over the fact sheets that you were given.

## 2019-06-16 ENCOUNTER — Other Ambulatory Visit: Payer: Self-pay

## 2019-06-16 ENCOUNTER — Other Ambulatory Visit (HOSPITAL_COMMUNITY): Payer: Medicare Other

## 2019-06-16 ENCOUNTER — Other Ambulatory Visit (HOSPITAL_COMMUNITY)
Admission: RE | Admit: 2019-06-16 | Discharge: 2019-06-16 | Disposition: A | Discharge status: Home / Self Care | Payer: Medicare Other | Source: Ambulatory Visit | Attending: General Surgery | Admitting: General Surgery

## 2019-06-16 ENCOUNTER — Encounter (HOSPITAL_COMMUNITY): Payer: Self-pay

## 2019-06-16 ENCOUNTER — Encounter (HOSPITAL_COMMUNITY)
Admission: RE | Admit: 2019-06-16 | Discharge: 2019-06-16 | Disposition: A | Discharge status: Home / Self Care | Payer: Medicare Other | Source: Ambulatory Visit | Attending: General Surgery | Admitting: General Surgery

## 2019-06-16 DIAGNOSIS — C50911 Malignant neoplasm of unspecified site of right female breast: Secondary | ICD-10-CM | POA: Diagnosis not present

## 2019-06-16 DIAGNOSIS — Z20828 Contact with and (suspected) exposure to other viral communicable diseases: Secondary | ICD-10-CM | POA: Diagnosis not present

## 2019-06-16 DIAGNOSIS — Z01812 Encounter for preprocedural laboratory examination: Secondary | ICD-10-CM | POA: Insufficient documentation

## 2019-06-16 HISTORY — DX: Unspecified macular degeneration: H35.30

## 2019-06-16 LAB — CBC
HCT: 39.2 % (ref 36.0–46.0)
Hemoglobin: 13.1 g/dL (ref 12.0–15.0)
MCH: 31.7 pg (ref 26.0–34.0)
MCHC: 33.4 g/dL (ref 30.0–36.0)
MCV: 94.9 fL (ref 80.0–100.0)
Platelets: 254 10*3/uL (ref 150–400)
RBC: 4.13 MIL/uL (ref 3.87–5.11)
RDW: 13.6 % (ref 11.5–15.5)
WBC: 6.8 10*3/uL (ref 4.0–10.5)
nRBC: 0 % (ref 0.0–0.2)

## 2019-06-16 LAB — BASIC METABOLIC PANEL
Anion gap: 9 (ref 5–15)
BUN: 22 mg/dL (ref 8–23)
CO2: 25 mmol/L (ref 22–32)
Calcium: 9.5 mg/dL (ref 8.9–10.3)
Chloride: 105 mmol/L (ref 98–111)
Creatinine, Ser: 0.85 mg/dL (ref 0.44–1.00)
GFR calc Af Amer: >60 mL/min (ref >60)
GFR calc non Af Amer: >60 mL/min (ref >60)
Glucose, Bld: 111 mg/dL — ABNORMAL HIGH (ref 70–99)
Potassium: 4.5 mmol/L (ref 3.5–5.1)
Sodium: 139 mmol/L (ref 135–145)

## 2019-06-16 NOTE — Progress Notes (Addendum)
PCP - Dr. [name] Cardiologist - Dr. Skeet Latch  PPM/ICD - N/A Device Orders -  Rep Notified -   Chest x-ray - N/A EKG - 02/27/19 Stress Test - 05/08/19 ECHO - 03/15/17 Cardiac Cath - 03/26/17  Sleep Study - N/A CPAP -   Fasting Blood Sugar - N/A Checks Blood Sugar _____ times a day  Blood Thinner Instructions: N/A Aspirin Instructions: no instructions given to pt, she states she will stop it today  ERAS Protcol - yes PRE-SURGERY Ensure or G2- None ordered  COVID TEST- done today 06/16/19.  Pt's son was with pt during PAT interview due to pt's difficulty hearing.   Anesthesia review: No  Patient denies shortness of breath, fever, cough and chest pain at PAT appointment   All instructions explained to the patient, with a verbal understanding of the material. Patient agrees to go over the instructions while at home for a better understanding. Patient also instructed to self quarantine after being tested for COVID-19. The opportunity to ask questions was provided.

## 2019-06-17 LAB — NOVEL CORONAVIRUS, NAA (HOSP ORDER, SEND-OUT TO REF LAB; TAT 18-24 HRS): SARS-CoV-2, NAA: NOT DETECTED

## 2019-06-18 DIAGNOSIS — C50411 Malignant neoplasm of upper-outer quadrant of right female breast: Secondary | ICD-10-CM | POA: Diagnosis not present

## 2019-06-18 NOTE — Anesthesia Preprocedure Evaluation (Addendum)
Anesthesia Evaluation  Patient identified by MRN, date of birth, ID band Patient awake    Reviewed: Allergy & Precautions, NPO status , Patient's Chart, lab work & pertinent test results, reviewed documented beta blocker date and time   History of Anesthesia Complications Negative for: history of anesthetic complications  Airway Mallampati: II  TM Distance: >3 FB Neck ROM: Full    Dental no notable dental hx. (+) Upper Dentures, Lower Dentures   Pulmonary COPD, Current Smoker,    Pulmonary exam normal        Cardiovascular hypertension, Pt. on medications and Pt. on home beta blockers + CAD and + Cardiac Stents (2018)  Normal cardiovascular exam  Normal stress test 04/2019   Neuro/Psych Anxiety negative neurological ROS  negative psych ROS   GI/Hepatic Neg liver ROS, GERD  Controlled and Medicated,  Endo/Other  Hypothyroidism   Renal/GU negative Renal ROS  negative genitourinary   Musculoskeletal  (+) Arthritis ,   Abdominal   Peds  Hematology negative hematology ROS (+)   Anesthesia Other Findings Day of surgery medications reviewed with patient.  Reproductive/Obstetrics negative OB ROS                            Anesthesia Physical Anesthesia Plan  ASA: III  Anesthesia Plan: General   Post-op Pain Management: GA combined w/ Regional for post-op pain   Induction: Intravenous  PONV Risk Score and Plan: 2 and Treatment may vary due to age or medical condition and Ondansetron  Airway Management Planned: LMA  Additional Equipment: None  Intra-op Plan:   Post-operative Plan: Extubation in OR  Informed Consent: I have reviewed the patients History and Physical, chart, labs and discussed the procedure including the risks, benefits and alternatives for the proposed anesthesia with the patient or authorized representative who has indicated his/her understanding and acceptance.      Dental advisory given  Plan Discussed with: CRNA  Anesthesia Plan Comments:        Anesthesia Quick Evaluation

## 2019-06-19 ENCOUNTER — Other Ambulatory Visit: Payer: Self-pay

## 2019-06-19 ENCOUNTER — Ambulatory Visit (HOSPITAL_COMMUNITY): Payer: Medicare Other | Admitting: Certified Registered Nurse Anesthetist

## 2019-06-19 ENCOUNTER — Encounter (HOSPITAL_COMMUNITY)
Admission: RE | Admit: 2019-06-19 | Discharge: 2019-06-19 | Disposition: A | Discharge status: Home / Self Care | Payer: Medicare Other | Source: Ambulatory Visit | Attending: General Surgery | Admitting: General Surgery

## 2019-06-19 ENCOUNTER — Ambulatory Visit (HOSPITAL_COMMUNITY)
Admission: RE | Admit: 2019-06-19 | Discharge: 2019-06-19 | Disposition: A | Discharge status: Home / Self Care | Payer: Medicare Other | Attending: General Surgery | Admitting: General Surgery

## 2019-06-19 ENCOUNTER — Encounter (HOSPITAL_COMMUNITY)
Admission: RE | Disposition: A | Discharge status: Home / Self Care | Payer: Self-pay | Source: Home / Self Care | Attending: General Surgery

## 2019-06-19 DIAGNOSIS — J449 Chronic obstructive pulmonary disease, unspecified: Secondary | ICD-10-CM | POA: Insufficient documentation

## 2019-06-19 DIAGNOSIS — Z955 Presence of coronary angioplasty implant and graft: Secondary | ICD-10-CM | POA: Diagnosis not present

## 2019-06-19 DIAGNOSIS — C773 Secondary and unspecified malignant neoplasm of axilla and upper limb lymph nodes: Secondary | ICD-10-CM | POA: Insufficient documentation

## 2019-06-19 DIAGNOSIS — G8918 Other acute postprocedural pain: Secondary | ICD-10-CM | POA: Diagnosis not present

## 2019-06-19 DIAGNOSIS — Z7982 Long term (current) use of aspirin: Secondary | ICD-10-CM | POA: Diagnosis not present

## 2019-06-19 DIAGNOSIS — I2510 Atherosclerotic heart disease of native coronary artery without angina pectoris: Secondary | ICD-10-CM | POA: Insufficient documentation

## 2019-06-19 DIAGNOSIS — E039 Hypothyroidism, unspecified: Secondary | ICD-10-CM | POA: Diagnosis not present

## 2019-06-19 DIAGNOSIS — F17200 Nicotine dependence, unspecified, uncomplicated: Secondary | ICD-10-CM | POA: Insufficient documentation

## 2019-06-19 DIAGNOSIS — C50411 Malignant neoplasm of upper-outer quadrant of right female breast: Secondary | ICD-10-CM | POA: Diagnosis not present

## 2019-06-19 DIAGNOSIS — I10 Essential (primary) hypertension: Secondary | ICD-10-CM | POA: Insufficient documentation

## 2019-06-19 DIAGNOSIS — Z79899 Other long term (current) drug therapy: Secondary | ICD-10-CM | POA: Diagnosis not present

## 2019-06-19 DIAGNOSIS — C50911 Malignant neoplasm of unspecified site of right female breast: Secondary | ICD-10-CM | POA: Diagnosis not present

## 2019-06-19 DIAGNOSIS — Z79890 Hormone replacement therapy (postmenopausal): Secondary | ICD-10-CM | POA: Diagnosis not present

## 2019-06-19 DIAGNOSIS — K219 Gastro-esophageal reflux disease without esophagitis: Secondary | ICD-10-CM | POA: Insufficient documentation

## 2019-06-19 DIAGNOSIS — F419 Anxiety disorder, unspecified: Secondary | ICD-10-CM | POA: Diagnosis not present

## 2019-06-19 DIAGNOSIS — Z170 Estrogen receptor positive status [ER+]: Secondary | ICD-10-CM | POA: Insufficient documentation

## 2019-06-19 HISTORY — PX: BREAST LUMPECTOMY WITH RADIOACTIVE SEED AND SENTINEL LYMPH NODE BIOPSY: SHX6550

## 2019-06-19 HISTORY — PX: BREAST LUMPECTOMY: SHX2

## 2019-06-19 SURGERY — BREAST LUMPECTOMY WITH RADIOACTIVE SEED AND SENTINEL LYMPH NODE BIOPSY
Anesthesia: General | Site: Breast | Laterality: Right

## 2019-06-19 MED ORDER — ONDANSETRON HCL 4 MG/2ML IJ SOLN
INTRAMUSCULAR | Status: DC | PRN
  Administered 2019-06-19: 4 mg via INTRAVENOUS

## 2019-06-19 MED ORDER — FENTANYL CITRATE (PF) 250 MCG/5ML IJ SOLN
INTRAMUSCULAR | Status: AC
  Filled 2019-06-19: qty 5

## 2019-06-19 MED ORDER — CHLORHEXIDINE GLUCONATE CLOTH 2 % EX PADS: 6.0000 | MEDICATED_PAD | Freq: Once | CUTANEOUS | Status: DC

## 2019-06-19 MED ORDER — PHENYLEPHRINE 40 MCG/ML (10ML) SYRINGE FOR IV PUSH (FOR BLOOD PRESSURE SUPPORT)
PREFILLED_SYRINGE | INTRAVENOUS | Status: DC | PRN
  Administered 2019-06-19: 80 ug via INTRAVENOUS
  Administered 2019-06-19: 40 ug via INTRAVENOUS
  Administered 2019-06-19 (×2): 80 ug via INTRAVENOUS
  Administered 2019-06-19: 40 ug via INTRAVENOUS
  Administered 2019-06-19: 80 ug via INTRAVENOUS

## 2019-06-19 MED ORDER — ACETAMINOPHEN 500 MG PO TABS
1000.0000 mg | ORAL_TABLET | ORAL | Status: AC
  Administered 2019-06-19: 1000 mg via ORAL
  Filled 2019-06-19: qty 2

## 2019-06-19 MED ORDER — FENTANYL CITRATE (PF) 100 MCG/2ML IJ SOLN: 25.0000 ug | INTRAMUSCULAR | Status: DC | PRN

## 2019-06-19 MED ORDER — CEFAZOLIN SODIUM-DEXTROSE 2-4 GM/100ML-% IV SOLN
2.0000 g | INTRAVENOUS | Status: AC
  Administered 2019-06-19: 2 g via INTRAVENOUS
  Filled 2019-06-19: qty 100

## 2019-06-19 MED ORDER — LIDOCAINE 2% (20 MG/ML) 5 ML SYRINGE
INTRAMUSCULAR | Status: DC | PRN
  Administered 2019-06-19: 60 mg via INTRAVENOUS

## 2019-06-19 MED ORDER — PROPOFOL 10 MG/ML IV BOLUS
INTRAVENOUS | Status: AC
  Filled 2019-06-19: qty 40

## 2019-06-19 MED ORDER — PROMETHAZINE HCL 25 MG/ML IJ SOLN: 6.2500 mg | INTRAMUSCULAR | Status: DC | PRN

## 2019-06-19 MED ORDER — PROPOFOL 10 MG/ML IV BOLUS
INTRAVENOUS | Status: DC | PRN
  Administered 2019-06-19: 100 mg via INTRAVENOUS
  Administered 2019-06-19: 50 mg via INTRAVENOUS

## 2019-06-19 MED ORDER — OXYCODONE HCL 5 MG PO TABS: 5.0000 mg | ORAL_TABLET | Freq: Once | ORAL | Status: DC | PRN

## 2019-06-19 MED ORDER — BUPIVACAINE-EPINEPHRINE (PF) 0.5% -1:200000 IJ SOLN
INTRAMUSCULAR | Status: DC | PRN
  Administered 2019-06-19: 20 mL via PERINEURAL

## 2019-06-19 MED ORDER — BUPIVACAINE-EPINEPHRINE (PF) 0.5% -1:200000 IJ SOLN
INTRAMUSCULAR | Status: DC | PRN
  Administered 2019-06-19: 15 mL via PERINEURAL

## 2019-06-19 MED ORDER — TECHNETIUM TC 99M SULFUR COLLOID FILTERED
1.0000 | Freq: Once | INTRAVENOUS | Status: AC | PRN
  Administered 2019-06-19: 1 via INTRADERMAL

## 2019-06-19 MED ORDER — HYDROCODONE-ACETAMINOPHEN 5-325 MG PO TABS: 1.0000 | ORAL_TABLET | Freq: Four times a day (QID) | ORAL | 0 refills | Status: DC | PRN

## 2019-06-19 MED ORDER — OXYCODONE HCL 5 MG/5ML PO SOLN: 5.0000 mg | Freq: Once | ORAL | Status: DC | PRN

## 2019-06-19 MED ORDER — LACTATED RINGERS IV SOLN
INTRAVENOUS | Status: DC
  Administered 2019-06-19: 10:00:00 via INTRAVENOUS

## 2019-06-19 MED ORDER — ROCURONIUM BROMIDE 10 MG/ML (PF) SYRINGE
PREFILLED_SYRINGE | INTRAVENOUS | Status: AC
  Filled 2019-06-19: qty 10

## 2019-06-19 MED ORDER — SODIUM CHLORIDE (PF) 0.9 % IJ SOLN
INTRAMUSCULAR | Status: AC
  Filled 2019-06-19: qty 10

## 2019-06-19 MED ORDER — MIDAZOLAM HCL 2 MG/2ML IJ SOLN
INTRAMUSCULAR | Status: AC
  Filled 2019-06-19: qty 2

## 2019-06-19 MED ORDER — FENTANYL CITRATE (PF) 250 MCG/5ML IJ SOLN
INTRAMUSCULAR | Status: DC | PRN
  Administered 2019-06-19: 50 ug via INTRAVENOUS

## 2019-06-19 MED ORDER — GABAPENTIN 300 MG PO CAPS
300.0000 mg | ORAL_CAPSULE | ORAL | Status: AC
  Administered 2019-06-19: 300 mg via ORAL
  Filled 2019-06-19: qty 1

## 2019-06-19 MED ORDER — 0.9 % SODIUM CHLORIDE (POUR BTL) OPTIME
TOPICAL | Status: DC | PRN
  Administered 2019-06-19: 1000 mL

## 2019-06-19 MED ORDER — EPHEDRINE SULFATE-NACL 50-0.9 MG/10ML-% IV SOSY
PREFILLED_SYRINGE | INTRAVENOUS | Status: DC | PRN
  Administered 2019-06-19: 5 mg via INTRAVENOUS
  Administered 2019-06-19: 10 mg via INTRAVENOUS

## 2019-06-19 MED ORDER — BUPIVACAINE-EPINEPHRINE 0.5% -1:200000 IJ SOLN
INTRAMUSCULAR | Status: AC
  Filled 2019-06-19: qty 1

## 2019-06-19 MED ORDER — PHENYLEPHRINE 40 MCG/ML (10ML) SYRINGE FOR IV PUSH (FOR BLOOD PRESSURE SUPPORT)
PREFILLED_SYRINGE | INTRAVENOUS | Status: AC
  Filled 2019-06-19: qty 10

## 2019-06-19 MED ORDER — SUCCINYLCHOLINE CHLORIDE 200 MG/10ML IV SOSY
PREFILLED_SYRINGE | INTRAVENOUS | Status: AC
  Filled 2019-06-19: qty 10

## 2019-06-19 MED ORDER — METHYLENE BLUE 0.5 % INJ SOLN
INTRAVENOUS | Status: AC
  Filled 2019-06-19: qty 10

## 2019-06-19 MED ORDER — FENTANYL CITRATE (PF) 100 MCG/2ML IJ SOLN
75.0000 ug | Freq: Once | INTRAMUSCULAR | Status: AC
  Administered 2019-06-19: 11:00:00 75 ug via INTRAVENOUS

## 2019-06-19 MED ORDER — FENTANYL CITRATE (PF) 100 MCG/2ML IJ SOLN
INTRAMUSCULAR | Status: AC
  Administered 2019-06-19: 75 ug via INTRAVENOUS
  Filled 2019-06-19: qty 2

## 2019-06-19 MED ORDER — LIDOCAINE 2% (20 MG/ML) 5 ML SYRINGE
INTRAMUSCULAR | Status: AC
  Filled 2019-06-19: qty 5

## 2019-06-19 MED ORDER — CELECOXIB 200 MG PO CAPS
200.0000 mg | ORAL_CAPSULE | ORAL | Status: AC
  Administered 2019-06-19: 200 mg via ORAL
  Filled 2019-06-19: qty 1

## 2019-06-19 SURGICAL SUPPLY — 37 items
APPLIER CLIP 9.375 MED OPEN (MISCELLANEOUS) ×4
BINDER BREAST LRG (GAUZE/BANDAGES/DRESSINGS) ×1 IMPLANT
BINDER BREAST XLRG (GAUZE/BANDAGES/DRESSINGS) IMPLANT
CANISTER SUCT 3000ML PPV (MISCELLANEOUS) ×2 IMPLANT
CHLORAPREP W/TINT 26 (MISCELLANEOUS) ×2 IMPLANT
CLIP APPLIE 9.375 MED OPEN (MISCELLANEOUS) ×1 IMPLANT
CONT SPEC 4OZ CLIKSEAL STRL BL (MISCELLANEOUS) ×3 IMPLANT
COVER PROBE W GEL 5X96 (DRAPES) ×2 IMPLANT
COVER SURGICAL LIGHT HANDLE (MISCELLANEOUS) ×2 IMPLANT
COVER WAND RF STERILE (DRAPES) IMPLANT
DERMABOND ADVANCED (GAUZE/BANDAGES/DRESSINGS) ×1
DERMABOND ADVANCED .7 DNX12 (GAUZE/BANDAGES/DRESSINGS) ×1 IMPLANT
DEVICE DUBIN SPECIMEN MAMMOGRA (MISCELLANEOUS) ×2 IMPLANT
DRAPE CHEST BREAST 15X10 FENES (DRAPES) ×2 IMPLANT
ELECT COATED BLADE 2.86 ST (ELECTRODE) ×2 IMPLANT
ELECT REM PT RETURN 9FT ADLT (ELECTROSURGICAL) ×2
ELECTRODE REM PT RTRN 9FT ADLT (ELECTROSURGICAL) ×1 IMPLANT
GLOVE BIO SURGEON STRL SZ7.5 (GLOVE) ×4 IMPLANT
GOWN STRL REUS W/ TWL LRG LVL3 (GOWN DISPOSABLE) ×2 IMPLANT
GOWN STRL REUS W/TWL LRG LVL3 (GOWN DISPOSABLE) ×2
KIT BASIN OR (CUSTOM PROCEDURE TRAY) ×2 IMPLANT
KIT MARKER MARGIN INK (KITS) ×2 IMPLANT
LIGHT WAVEGUIDE WIDE FLAT (MISCELLANEOUS) IMPLANT
NDL 18GX1X1/2 (RX/OR ONLY) (NEEDLE) IMPLANT
NDL FILTER BLUNT 18X1 1/2 (NEEDLE) IMPLANT
NDL HYPO 25GX1X1/2 BEV (NEEDLE) ×1 IMPLANT
NEEDLE 18GX1X1/2 (RX/OR ONLY) (NEEDLE) IMPLANT
NEEDLE FILTER BLUNT 18X 1/2SAF (NEEDLE)
NEEDLE FILTER BLUNT 18X1 1/2 (NEEDLE) IMPLANT
NEEDLE HYPO 25GX1X1/2 BEV (NEEDLE) ×2 IMPLANT
NS IRRIG 1000ML POUR BTL (IV SOLUTION) ×2 IMPLANT
PACK GENERAL/GYN (CUSTOM PROCEDURE TRAY) ×2 IMPLANT
SUT MNCRL AB 4-0 PS2 18 (SUTURE) ×4 IMPLANT
SUT VIC AB 3-0 SH 18 (SUTURE) ×2 IMPLANT
SYR CONTROL 10ML LL (SYRINGE) ×2 IMPLANT
TOWEL GREEN STERILE (TOWEL DISPOSABLE) ×2 IMPLANT
TOWEL GREEN STERILE FF (TOWEL DISPOSABLE) ×2 IMPLANT

## 2019-06-19 NOTE — H&P (Signed)
Marissa Caldwell  Location: [facility] Patient #: [id number] DOB: Jan 15, 1932 Undefined / Language: Cleophus Molt / Race: White Female   History of Present Illness The patient is a 85 year old female who presents with breast cancer. We are asked to see the patient in consultation by Dr. Isidore Moos to evaluate her for a new right breast cancer. The patient is a 85 year old white female who presents with a 3cm palpable mass in the UOQ of the right breast. She is not sure how long it has been there. She denies any pain or drainage. She does not smoke and has no family history of breast cancer. She does have significant COPD and CAD and had a cardiac stent placed 2 years ago. She is follow by Dr. Oval Linsey. She was also found to have an enlarged lymph node in the axilla. this was biopsied as well and was positive but no lymphoid tissue was seen   Past Surgical History  Breast Biopsy  Right. Carotid Artery Surgery  Bilateral. Hysterectomy (not due to cancer) - Partial   Diagnostic Studies History  Colonoscopy  >10 years ago Mammogram  within last year Pap Smear  >5 years ago  Medication History Medications Reconciled  Social History  Alcohol use  Occasional alcohol use. No drug use  Tobacco use  Current every day smoker.  Family History  Arthritis  Sister. Cancer  Sister. Heart Disease  Father. Heart disease in female family member before age 51  Melanoma  Son. Thyroid problems  Sister.  Pregnancy / Birth History  Age at menarche  71 years. Age of menopause  46-50 Gravida  1 Irregular periods  Maternal age  30-20 Para  1  Other Problems  Chronic Obstructive Lung Disease  High blood pressure  Thyroid Disease     Review of Systems  General Not Present- Appetite Loss, Chills, Fatigue, Fever, Night Sweats, Weight Gain and Weight Loss. Skin Not Present- Change in Wart/Mole, Dryness, Hives, Jaundice, New Lesions, Non-Healing Wounds, Rash and  Ulcer. HEENT Not Present- Earache, Hearing Loss, Hoarseness, Nose Bleed, Oral Ulcers, Ringing in the Ears, Seasonal Allergies, Sinus Pain, Sore Throat, Visual Disturbances, Wears glasses/contact lenses and Yellow Eyes. Respiratory Not Present- Bloody sputum, Chronic Cough, Difficulty Breathing, Snoring and Wheezing. Breast Not Present- Breast Mass, Breast Pain, Nipple Discharge and Skin Changes. Cardiovascular Not Present- Chest Pain, Difficulty Breathing Lying Down, Leg Cramps, Palpitations, Rapid Heart Rate, Shortness of Breath and Swelling of Extremities. Gastrointestinal Not Present- Abdominal Pain, Bloating, Bloody Stool, Change in Bowel Habits, Chronic diarrhea, Constipation, Difficulty Swallowing, Excessive gas, Gets full quickly at meals, Hemorrhoids, Indigestion, Nausea, Rectal Pain and Vomiting. Female Genitourinary Not Present- Frequency, Nocturia, Painful Urination, Pelvic Pain and Urgency. Musculoskeletal Not Present- Back Pain, Joint Pain, Joint Stiffness, Muscle Pain, Muscle Weakness and Swelling of Extremities. Neurological Not Present- Decreased Memory, Fainting, Headaches, Numbness, Seizures, Tingling, Tremor, Trouble walking and Weakness. Psychiatric Present- Anxiety. Not Present- Bipolar, Change in Sleep Pattern, Depression, Fearful and Frequent crying. Endocrine Not Present- Cold Intolerance, Excessive Hunger, Hair Changes, Heat Intolerance, Hot flashes and New Diabetes. Hematology Not Present- Blood Thinners, Easy Bruising, Excessive bleeding, Gland problems, HIV and Persistent Infections.   Physical Exam General Mental Status-Alert. General Appearance-Consistent with stated age. Hydration-Well hydrated. Voice-Normal.  Head and Neck Head-normocephalic, atraumatic with no lesions or palpable masses. Trachea-midline. Thyroid Gland Characteristics - normal size and consistency.  Eye Eyeball - Bilateral-Extraocular movements intact. Sclera/Conjunctiva -  Bilateral-No scleral icterus.  Chest and Lung Exam Chest and lung  exam reveals -quiet, even and easy respiratory effort with no use of accessory muscles and on auscultation, normal breath sounds, no adventitious sounds and normal vocal resonance. Inspection Chest Wall - Normal. Back - normal.  Breast Note: There is a 3cm palpable lateral mass in the right breast that is mobile and not fixed to skin or chest wall. There is no palpable mass in the left breast. There is a large mobile palpable lymph node in the right axilla but no other lymphadenopathy   Cardiovascular Cardiovascular examination reveals -normal heart sounds, regular rate and rhythm with no murmurs and normal pedal pulses bilaterally.  Abdomen Inspection Inspection of the abdomen reveals - No Hernias. Skin - Scar - no surgical scars. Palpation/Percussion Palpation and Percussion of the abdomen reveal - Soft, Non Tender, No Rebound tenderness, No Rigidity (guarding) and No hepatosplenomegaly. Auscultation Auscultation of the abdomen reveals - Bowel sounds normal.  Neurologic Neurologic evaluation reveals -alert and oriented x 3 with no impairment of recent or remote memory. Mental Status-Normal.  Musculoskeletal Normal Exam - Left-Upper Extremity Strength Normal and Lower Extremity Strength Normal. Normal Exam - Right-Upper Extremity Strength Normal and Lower Extremity Strength Normal.  Lymphatic Head & Neck  General Head & Neck Lymphatics: Bilateral - Description - Normal. Axillary  General Axillary Region: Bilateral - Description - Normal. Tenderness - Non Tender. Femoral & Inguinal  Generalized Femoral & Inguinal Lymphatics: Bilateral - Description - Normal. Tenderness - Non Tender.    Assessment & Plan  MALIGNANT NEOPLASM OF UPPER-OUTER QUADRANT OF RIGHT BREAST IN FEMALE, ESTROGEN RECEPTOR POSITIVE (C50.411) Impression: The patient appears to have a 2.7cm cancer in the outer right breast and  a positive lymph node. I have talked to her in detail about the different options for treatment. She does seem quite frail and I am not sure she will tolerate surgery. We will get cardiac clearance from her cardiologist and in the meantime we will start her on antiestrogens and monitor closely. If she can tolerate surgery I would probably plan for lumpectomy and sentinel node biopsy with targeted node dissection Current Plans Follow up with [place] in the office in 3 MONTHS.  Call us sooner as needed.

## 2019-06-19 NOTE — Anesthesia Procedure Notes (Addendum)
Procedure Name: LMA Insertion Date/Time: 06/19/2019 11:44 AM Performed by: Janene Harvey, CRNA Pre-anesthesia Checklist: Patient identified, Emergency Drugs available, Suction available, Patient being monitored and Timeout performed Patient Re-evaluated:Patient Re-evaluated prior to induction Oxygen Delivery Method: Circle system utilized Preoxygenation: Pre-oxygenation with 100% oxygen Induction Type: IV induction LMA: LMA inserted LMA Size: 3.0 Number of attempts: 1 Placement Confirmation: positive ETCO2 and breath sounds checked- equal and bilateral Tube secured with: Tape Dental Injury: Teeth and Oropharynx as per pre-operative assessment

## 2019-06-19 NOTE — Anesthesia Postprocedure Evaluation (Signed)
Anesthesia Post Note  Patient: Boneta Zurfluh Yaeger  Procedure(s) Performed: RIGHT BREAST LUMPECTOMY WITH RADIOACTIVE SEED AND SENTINEL LYMPH NODE BIOPSY (Right Breast)     Patient location during evaluation: PACU Anesthesia Type: General Level of consciousness: awake and alert and oriented Pain management: pain level controlled Vital Signs Assessment: post-procedure vital signs reviewed and stable Respiratory status: spontaneous breathing, nonlabored ventilation and respiratory function stable Cardiovascular status: blood pressure returned to baseline Postop Assessment: no apparent nausea or vomiting Anesthetic complications: no    Last Vitals:  Vitals:   06/19/19 1115 06/19/19 1300  BP: (!) 178/89 (!) 159/84  Pulse:  73  Resp: 19 20  Temp:  36.4 C  SpO2:  100%    Last Pain:  Vitals:   06/19/19 1300  TempSrc:   PainSc: 0-No pain                 Brennan Bailey

## 2019-06-19 NOTE — Transfer of Care (Signed)
Immediate Anesthesia Transfer of Care Note  Patient: Shivanshi Chaput Ibanez  Procedure(s) Performed: RIGHT BREAST LUMPECTOMY WITH RADIOACTIVE SEED AND SENTINEL LYMPH NODE BIOPSY (Right Breast)  Patient Location: PACU  Anesthesia Type:General  Level of Consciousness: awake  Airway & Oxygen Therapy: Patient Spontanous Breathing and Patient connected to face mask oxygen  Post-op Assessment: Report given to RN  Post vital signs: Reviewed  Last Vitals:  Vitals Value Taken Time  BP 159/84 06/19/19 1300  Temp 36.4 C 06/19/19 1300  Pulse 70 06/19/19 1302  Resp 16 06/19/19 1302  SpO2 100 % 06/19/19 1302  Vitals shown include unvalidated device data.  Last Pain:  Vitals:   06/19/19 1115  TempSrc:   PainSc: 0-No pain         Complications: No apparent anesthesia complications

## 2019-06-19 NOTE — Discharge Instructions (Signed)

## 2019-06-19 NOTE — Anesthesia Procedure Notes (Signed)
Anesthesia Regional Block: Pectoralis block   Pre-Anesthetic Checklist: ,, timeout performed, Correct Patient, Correct Site, Correct Laterality, Correct Procedure, Correct Position, site marked, Risks and benefits discussed, pre-op evaluation,  At surgeon's request and post-op pain management  Laterality: Right  Prep: Maximum Sterile Barrier Precautions used, chloraprep       Needles:  Injection technique: Single-shot  Needle Type: Echogenic Stimulator Needle     Needle Length: 9cm  Needle Gauge: 22     Additional Needles:   Procedures:,,,, ultrasound used (permanent image in chart),,,,  Narrative:  Start time: 06/19/2019 11:00 AM End time: 06/19/2019 11:03 AM Injection made incrementally with aspirations every 5 mL.  Performed by: Personally  Anesthesiologist: Brennan Bailey, MD  Additional Notes: Risks, benefits, and alternative discussed. Patient gave consent for procedure. Patient prepped and draped in sterile fashion. Sedation administered, patient remains easily responsive to voice. Relevant anatomy identified with ultrasound guidance. Local anesthetic given in 5cc increments with no signs or symptoms of intravascular injection. No pain or paraesthesias with injection. Patient monitored throughout procedure with signs of LAST or immediate complications. Tolerated well. Ultrasound image placed in chart.  Tawny Asal, MD

## 2019-06-19 NOTE — Op Note (Signed)
06/19/2019  12:48 PM  PATIENT:  Marissa Caldwell  85 y.o. female  PRE-OPERATIVE DIAGNOSIS:  RIGHT BREAST CANCER  POST-OPERATIVE DIAGNOSIS:  RIGHT BREAST CANCER  PROCEDURE:  Procedure(s): RIGHT BREAST LUMPECTOMY WITH RADIOACTIVE SEED LOCALIZATION AND DEEP RIGHT AXILLARY SENTINEL LYMPH NODE BIOPSY (Right)  SURGEON:  Surgeon(s) and Role:    * Jovita Kussmaul, MD - Primary  PHYSICIAN ASSISTANT:   ASSISTANTS: none   ANESTHESIA:   local and general  EBL:  minimal   BLOOD ADMINISTERED:none  DRAINS: none   LOCAL MEDICATIONS USED:  MARCAINE     SPECIMEN:  Source of Specimen:  right breast tissue with additional medial margin and sentinel nodes x 2 with 2 additional nodes  DISPOSITION OF SPECIMEN:  PATHOLOGY  COUNTS:  YES  TOURNIQUET:  * No tourniquets in log *  DICTATION: .Dragon Dictation   After informed consent was obtained the patient was brought to the operating room and placed in the supine position on the operating table.  After adequate induction of general anesthesia the patient's right chest, breast, and axillary area were prepped with ChloraPrep, allowed to dry, and draped in usual sterile manner.  An appropriate timeout was performed.  Earlier in the day the patient underwent injection of 1 mCi of technetium sulfur colloid in the subareolar position on the right.  Also previously the patient underwent placement of an I-125 seed in the lower outer right breast to mark an area of invasive breast cancer.  Attention was first turned to the right breast.  The neoprobe was set to I-125 in the area of radioactivity was readily identified.  An elliptical incision was made in the skin radially overlying the radioactive seed since it was so superficial to the skin.  The incision was carried through the skin and subcutaneous tissue sharply with the electrocautery.  While checking the area of radioactivity frequently a circular portion of breast tissue was then excised sharply around  the radioactive seed.  This dissection was carried all the way to the chest wall.  Once the specimen was removed it was oriented with the appropriate paint colors.  A specimen radiograph was obtained that showed the clip and seed to be near the center of the specimen.  Based on this image I did decide to take a little extra medial margin.  This was marked appropriately and also sent to pathology for further evaluation.  The cavity was then marked with clips and irrigated with saline.  The cavity was infiltrated with more quarter percent Marcaine.  The deep layer of the wound was then closed with layers of interrupted 3-0 Vicryl stitches.  The skin was then closed with a running 4-0 Monocryl subcuticular stitch.  Attention was then turned to the right breast.  The neoprobe was set to technetium.  There was a large palpable mobile lymph node in the right axilla that was previously biopsied and was positive.  A transversely oriented incision was made overlying this area.  The incision was carried through the skin and subcutaneous tissue sharply with the electrocautery until the deep right axillary space was entered.  There were actually 2 large palpable lymph nodes adjacent to each other.  These were excised sharply with the electrocautery and the surrounding vessels and lymphatics were controlled with clips.  The largest node had an ex vivo count of approximately 20.  There were 2 other palpable but much smaller nodes in the right axilla and these were also excised sharply with the electrocautery and the  lymphatics and small vessels were controlled with clips.  Once these 4 lymph nodes were removed they were sent to pathology for further evaluation.  There were no other hot or palpable lymph nodes in the right axilla.  At this point the deep layer of the axilla was closed with interrupted 3-0 Vicryl stitches.  The skin was closed with a running 4-0 Monocryl subcuticular stitch.  Dermabond dressings were applied.  The  patient tolerated the procedure well.  At the end of the case all needle sponge and instrument counts were correct.  The patient was then awakened and taken to recovery in stable condition.  PLAN OF CARE: Discharge to home after PACU  PATIENT DISPOSITION:  PACU - hemodynamically stable.   Delay start of Pharmacological VTE agent (>24hrs) due to surgical blood loss or risk of bleeding: not applicable

## 2019-06-19 NOTE — Interval H&P Note (Signed)
History and Physical Interval Note:  06/19/2019 10:56 AM  Marissa Caldwell  has presented today for surgery, with the diagnosis of RIGHT BREAST CANCER.  The various methods of treatment have been discussed with the patient and family. After consideration of risks, benefits and other options for treatment, the patient has consented to  Procedure(s): RIGHT BREAST LUMPECTOMY WITH RADIOACTIVE SEED AND SENTINEL LYMPH NODE BIOPSY (Right) as a surgical intervention.  The patient's history has been reviewed, patient examined, no change in status, stable for surgery.  I have reviewed the patient's chart and labs.  Questions were answered to the patient's satisfaction.     Autumn Messing III

## 2019-06-20 ENCOUNTER — Encounter (HOSPITAL_COMMUNITY): Payer: Self-pay | Admitting: General Surgery

## 2019-06-30 NOTE — Progress Notes (Signed)
[facility] Note  07/01/2019     CHIEF COMPLAINT Patient presents for Retina Follow Up   HISTORY OF PRESENT ILLNESS: Marissa Caldwell is a 85 y.o. female who presents to the clinic today for:   HPI    Retina Follow Up    Patient presents with  Wet AMD.  In right eye.  This started 4 weeks ago.  Severity is moderate.  I, the attending physician,  performed the HPI with the patient and updated documentation appropriately.          Comments    Patient here for 4 weeks retina follow up for exu ARMD OD. Patient states vision about the same. No eye pain.       Last edited by Bernarda Caffey, MD on 07/01/2019  4:28 PM. (History)    pt is delayed to follow up due breast cancer sx  Referring physician: Hortencia Pilar, MD [place],  [place] [address]  HISTORICAL INFORMATION:   Selected notes from the MEDICAL RECORD NUMBER Referred by Dr. Quentin Ore for concern of exudative ARMD OD   CURRENT MEDICATIONS: Current Outpatient Medications (Ophthalmic Drugs)  Medication Sig  . brimonidine (ALPHAGAN) 0.2 % ophthalmic solution Place 1 drop into the left eye 2 (two) times daily.   Marland Kitchen latanoprost (XALATAN) 0.005 % ophthalmic solution Place 1 drop into both eyes at bedtime.   Current Facility-Administered Medications (Ophthalmic Drugs)  Medication Route  . aflibercept (EYLEA) SOLN 2 mg Intravitreal  . aflibercept (EYLEA) SOLN 2 mg Intravitreal  . aflibercept (EYLEA) SOLN 2 mg Intravitreal  . aflibercept (EYLEA) SOLN 2 mg Intravitreal  . aflibercept (EYLEA) SOLN 2 mg Intravitreal   Current Outpatient Medications (Other)  Medication Sig  . albuterol (PROVENTIL HFA;VENTOLIN HFA) 108 (90 Base) MCG/ACT inhaler Inhale 2 puffs into the lungs every 6 (six) hours as needed (for shortness of breath/wheezing.).   Marland Kitchen aspirin EC 81 MG tablet Take 81 mg by mouth daily.  . budesonide-formoterol (SYMBICORT) 160-4.5 MCG/ACT inhaler Inhale 2 puffs  into the lungs 2 (two) times daily.  . Ca Carbonate-Mag Hydroxide (ROLAIDS PO) Take 1-2 tablets by mouth daily as needed (acid reflux).  . Calcium Carb-Cholecalciferol (CALCIUM + D3) 600-200 MG-UNIT TABS Take 1 tablet by mouth daily.  . carvedilol (COREG) 3.125 MG tablet TAKE 1 TABLET BY MOUTH TWICE DAILY (Patient taking differently: Take 3.125 mg by mouth See admin instructions. Take 3.125 in the morning and may take a second 3.125 mg dose in the evening as needed for SBP over 125)  . Cholecalciferol (VITAMIN D3) 2000 units capsule Take 4,000 Units by mouth daily.  . Coenzyme Q10 (COQ10) 100 MG CAPS Take 100 mg by mouth daily.  Marland Kitchen HYDROcodone-acetaminophen (NORCO/VICODIN) 5-325 MG tablet Take 1-2 tablets by mouth every 6 (six) hours as needed for moderate pain or severe pain.  Marland Kitchen levothyroxine (SYNTHROID, LEVOTHROID) 100 MCG tablet Take 100 mcg by mouth daily before breakfast.   . LORazepam (ATIVAN) 0.5 MG tablet Take 0.5 mg by mouth daily at 2 PM.   . LUTEIN PO Take 1 tablet by mouth daily.  . mirtazapine (REMERON) 15 MG tablet Take 15 mg by mouth at bedtime.  . Multiple Vitamin (MULTIVITAMIN) tablet Take 1 tablet by mouth daily.  . nitroGLYCERIN (NITROSTAT) 0.4 MG SL tablet Place 1 tablet (0.4 mg total) under the tongue every 5 (five) minutes as needed for chest pain.  . Omega-3 Fatty Acids (FISH OIL PO) Take 1  capsule by mouth daily.  . pravastatin (PRAVACHOL) 40 MG tablet TAKE 1 TABLET BY MOUTH DAILY. GENERIC EQUIVALENT FOR PRAVACHOL. NEW DOSE, DISCONTINUE PREVIOUS PRESCRIPTION (Patient taking differently: Take 40 mg by mouth at bedtime. )  . tamoxifen (NOLVADEX) 20 MG tablet Take 1 tablet (20 mg total) by mouth daily. (Patient not taking: Reported on 06/10/2019)  . TOVIAZ 4 MG TB24 tablet Take 4 mg by mouth daily.    Current Facility-Administered Medications (Other)  Medication Route  . Bevacizumab (AVASTIN) SOLN 1.25 mg Intravitreal  . Bevacizumab (AVASTIN) SOLN 1.25 mg Intravitreal  .  Bevacizumab (AVASTIN) SOLN 1.25 mg Intravitreal      REVIEW OF SYSTEMS: ROS    Positive for: Eyes   Negative for: Constitutional, Gastrointestinal, Neurological, Skin, Genitourinary, Musculoskeletal, HENT, Endocrine, Cardiovascular, Respiratory, Psychiatric, Allergic/Imm, Heme/Lymph   Last edited by Theodore Demark, COA on 07/01/2019  3:02 PM. (History)       ALLERGIES Allergies  Allergen Reactions  . Lipitor [Atorvastatin]     Elevated LFT's    PAST MEDICAL HISTORY Past Medical History:  Diagnosis Date  . Anxiety   . Arthritis   . CAD (coronary artery disease)    a. 03/2017: 95% LCx stenosis --> PCI/DES placed  . Cataract   . Chest pain 03/27/2017  . Chronic diastolic heart failure ([place]) 06/25/2017  . Chronic lower back pain   . COPD (chronic obstructive pulmonary disease) ([place])   . Coronary artery disease   . Diverticulitis   . Diverticulosis   . GERD (gastroesophageal reflux disease)   . Glaucoma   . Heart failure, type unknown ([place]) 03/11/2017  . Hyperlipidemia 03/11/2017  . Hypertension   . Hypothyroidism   . Macular degeneration    wet and dry per pt's son  . Pneumonia X 1   "years and years ago" (03/26/2017)  . Shortness of breath 03/11/2017  . Status post dilation of esophageal narrowing    Past Surgical History:  Procedure Laterality Date  . BILATERAL OOPHORECTOMY Bilateral   . BREAST LUMPECTOMY WITH RADIOACTIVE SEED AND SENTINEL LYMPH NODE BIOPSY Right 06/19/2019   Procedure: RIGHT BREAST LUMPECTOMY WITH RADIOACTIVE SEED AND SENTINEL LYMPH NODE BIOPSY;  Surgeon: Jovita Kussmaul, MD;  Location: [facility];  Service: General;  Laterality: Right;  . CATARACT EXTRACTION    . CATARACT EXTRACTION W/ INTRAOCULAR LENS  IMPLANT, BILATERAL Bilateral   . CORONARY ANGIOPLASTY WITH STENT PLACEMENT  03/26/2017  . LEFT HEART CATH AND CORONARY ANGIOGRAPHY N/A 03/26/2017   Procedure: Left Heart Cath and Coronary Angiography;  Surgeon: Belva Crome, MD;  Location: [facility];  Service: Cardiovascular;  Laterality: N/A;  . NERVE SURGERY     "lump on my sciatic nerve was removed years ago"  . SHOULDER OPEN ROTATOR CUFF REPAIR Right   . THYROIDECTOMY, PARTIAL      FAMILY HISTORY Family History  Problem Relation Age of Onset  . CAD Mother   . Cataracts Mother   . Coronary artery disease Father   . Leukemia Sister   . Amblyopia Neg Hx   . Blindness Neg Hx   . Diabetes Neg Hx   . Glaucoma Neg Hx   . Macular degeneration Neg Hx   . Retinal detachment Neg Hx   . Strabismus Neg Hx   . Retinitis pigmentosa Neg Hx     SOCIAL HISTORY Social History   Tobacco Use  . Smoking status: Current Some Day Smoker    Packs/day: 0.50    Years:  60.00    Pack years: 30.00    Types: Cigarettes  . Smokeless tobacco: Never Used  Substance Use Topics  . Alcohol use: Yes    Comment: twice a month  . Drug use: No         OPHTHALMIC EXAM:  Base Eye Exam    Visual Acuity (Snellen - Linear)      Right Left   Dist cc CF at 3' 20/40 -2   Dist ph cc NI NI   Correction: Glasses       Tonometry (Tonopen, 3:00 PM)      Right Left   Pressure 13 14       Pupils      Dark Light Shape React APD   Right 3 2 Round Minimal None   Left 3 2 Round Minimal None       Visual Fields (Counting fingers)      Left Right    Full Full       Extraocular Movement      Right Left    Full Full       Neuro/Psych    Oriented x3: Yes   Mood/Affect: Normal       Dilation    Both eyes: 1.0% Mydriacyl, 2.5% Phenylephrine @ 2:59 PM        Slit Lamp and Fundus Exam    Slit Lamp Exam      Right Left   Lids/Lashes Dermatochalasis - upper lid, Telangiectasia Dermatochalasis - upper lid, Telangiectasia   Conjunctiva/Sclera White and quiet White and quiet   Cornea Arcus, 2+ diffuse Punctate epithelial erosions, diffuse endo pigment, decreased TBUT, Krukenberg's spindle Arcus, 2-3+ diffuse Punctate epithelial erosions, mild corneal haze centrally, diffuse endo pigment,  decreased TBUT, Krukenberg's spindle   Anterior Chamber Deep and quiet Deep and quiet   Iris Round and dilated Round and dilated   Lens Three piece PC IOL in good position, open PC Three piece PC IOL in good position, clear PC   Vitreous Vitreous syneresis, Posterior vitreous detachment, mild vitreous condensations Mild Vitreous syneresis, Posterior vitreous detachment       Fundus Exam      Right Left   Disc sharp rim, 360 Peripapillary atrophy 360 Peripapillary atrophy, trace temporal pallor, sharp rim   C/D Ratio 0.2 0.1   Macula Blunted foveal reflex, Drusen, RPE mottling and clumping, +PED, +CNVM, sub-retinal Disciform scar, peripapillary sub-retinal heme at 0900 -- still prominent Blunted foveal reflex, Drusen, RPE mottling, clumping, and Atrophy, focal GA nasal macula   Vessels Vascular attenuation Vascular attenuation   Periphery Attached Attached        Refraction    Wearing Rx      Sphere Cylinder Axis Add   Right -1.00 +1.50 014 +2.50   Left -1.25 +1.25 174 +2.50          IMAGING AND PROCEDURES  Imaging and Procedures for @TODAY @  OCT, Retina - OU - Both Eyes       Right Eye Quality was good. Central Foveal Thickness: 344. Progression has improved. Findings include subretinal hyper-reflective material, pigment epithelial detachment, choroidal neovascular membrane, retinal drusen , epiretinal membrane, disciform scar, subretinal fluid, intraretinal fluid, abnormal foveal contour (Interval decrease in peripapillary / nasal macula IRF and SRF; persistent thick PED/SRHM/disciform scar--slightly improved).   Left Eye Quality was good. Central Foveal Thickness: 206. Progression has been stable. Findings include abnormal foveal contour, retinal drusen , no IRF, no SRF, outer retinal atrophy, pigment epithelial detachment (?interval increase  in Cecilia area in en face image).   Notes *Images captured and stored on drive  Diagnosis / Impression:  OD: Exudative AMD with  significant submacular scar/SRHM- Interval decrease in peripapillary / nasal macula IRF and SRF; persistent thick PED/SRHM/disciform scar--slightly improved OS: Non-exudative AMD w/ GA - stable from prior  Clinical management:  See below  Abbreviations: NFP - Normal foveal profile. CME - cystoid macular edema. PED - pigment epithelial detachment. IRF - intraretinal fluid. SRF - subretinal fluid. EZ - ellipsoid zone. ERM - epiretinal membrane. ORA - outer retinal atrophy. ORT - outer retinal tubulation. SRHM - subretinal hyper-reflective material         Intravitreal Injection, Pharmacologic Agent - OD - Right Eye       Time Out 07/01/2019. 3:37 PM. Confirmed correct patient, procedure, site, and patient consented.   Anesthesia Topical anesthesia was used. Anesthetic medications included Lidocaine 2%, Proparacaine 0.5%.   Procedure Preparation included 5% betadine to ocular surface, eyelid speculum. A 30 gauge needle was used.   Injection:  2 mg aflibercept Alfonse Flavors) SOLN   NDC: O5083423, Lot: [id number], Expiration date: 12/16/2019   Route: Intravitreal, Site: Right Eye, Waste: 0 mL  Post-op Post injection exam found visual acuity of at least counting fingers. The patient tolerated the procedure well. There were no complications. The patient received written and verbal post procedure care education.                 ASSESSMENT/PLAN:    ICD-10-CM   1. 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 Retinal edema  H35.81 OCT, Retina - OU - Both Eyes  4. Posterior vitreous detachment of both eyes  H43.813   5. Pigmentary glaucoma of both eyes, mild stage  H40.1331   6. Pseudophakia of both eyes  Z96.1     1.  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 Kathlen Mody  - S/P IVA OD #1 (05.20.19), #2 (07.02.19), #3 (07.30.19)  - S/P IVE OD #1 (08.27.19), #2 (09.24.19), #3 (11.05.19), #4 (12.17.19), #5 (02.21.20), #6 (05.05.20), # 7 (08.05.20), #8 (9.2.20)  - new, large peripapillary heme noted 8.5.2020  - today, delayed f/u from 4 wks to 9+ wks due to R breast cancer diagnosis and surgery  - VA remains CF  - exam shows persistent, peripapillary hemorrhage OD -- 0900 -- improving  - OCT with interval decrease in peripapillary / nasal macula IRF and SRF; persistent thick PED/SRHM/disciform scar--slightly improved  - discussed findings and guarded prognosis  - switch in therapy to [facility] approved through [employer]  - recommend IVE OD #9 today (11.10.20) w/ f/u in 4-5 wks  - pt wishes to be treated with IVE OD  - RBA of procedure discussed, questions answered  - informed consent obtained and signed  - see procedure note  - Eylea paperwork and benefits investigation started on 07.30.19- Approved for 2020 [employer]  - f/u in 4-5 weeks -- DFE/OCT/ possible injection  2. Age related macular degeneration, non-exudative, left eye  - possible progression of atrophy on OCT today  - The incidence, anatomy, and pathology of dry AMD, risk of progression, and the AREDS and AREDS 2 study including smoking risks discussed  with patient.  - continue amsler grid monitoring  3. Retinal edema as above  4. PVD / vitreous syneresis OU  Discussed findings and prognosis  No RT or RD on 360 peripheral exam  Reviewed s/s of RT/RD  Strict return precautions for any such RT/RD signs/symptoms  5. Pigmentary glaucoma OU-   - using latanoprost OU qhs  - IOP good today  - under the expert care of Dr. Quentin Ore  - monitor  6. Pseudophakia OU  - s/p CE/IOL OU  - beautiful surgery, doing well  - monitor   Ophthalmic Meds Ordered this visit:  Meds  ordered this encounter  Medications  . aflibercept (EYLEA) SOLN 2 mg       Return for f/u 4-5 weeks, exu ARMD OD.  There are no Patient Instructions on file for this visit.   Explained the diagnoses, plan, and follow up with the patient and they expressed understanding.  Patient expressed understanding of the importance of proper follow up care.   This document serves as a record of services personally performed by Gardiner Sleeper, MD, PhD. It was created on their behalf by Estill Bakes, COT an ophthalmic technician. The creation of this record is the provider's dictation and/or activities during the visit.    Electronically signed by: Estill Bakes, COT 06/30/19 @ 5:21 PM   This document serves as a record of services personally performed by Gardiner Sleeper, MD, PhD. It was created on their behalf by Ernest Mallick, OA, an ophthalmic assistant. The creation of this record is the provider's dictation and/or activities during the visit.    Electronically signed by: Ernest Mallick, OA 11.10.2020 5:21 PM  Gardiner Sleeper, M.D., Ph.D. Diseases & Surgery of the Retina and Vitreous [facility]   I have reviewed the above documentation for accuracy and completeness, and I agree with the above. Gardiner Sleeper, M.D., Ph.D. 07/01/19 5:21 PM    Abbreviations: M myopia (nearsighted); A astigmatism; H hyperopia (farsighted); P presbyopia; Mrx spectacle prescription;  CTL contact lenses; OD right eye; OS left eye; OU both eyes  XT exotropia; ET esotropia; PEK punctate epithelial keratitis; PEE punctate epithelial erosions; DES dry eye syndrome; MGD meibomian gland dysfunction; ATs artificial tears; PFAT's preservative free artificial tears; [place] nuclear sclerotic cataract; PSC posterior subcapsular cataract; ERM epi-retinal membrane; PVD posterior vitreous detachment; RD retinal detachment; DM diabetes mellitus; DR diabetic retinopathy; NPDR non-proliferative diabetic  retinopathy; PDR proliferative diabetic retinopathy; CSME clinically significant macular edema; DME diabetic macular edema; dbh dot blot hemorrhages; CWS cotton wool spot; POAG primary open angle glaucoma; C/D cup-to-disc ratio; HVF humphrey visual field; GVF goldmann visual field; OCT optical coherence tomography; IOP intraocular pressure; BRVO Branch retinal vein occlusion; CRVO central retinal vein occlusion; CRAO central retinal artery occlusion; BRAO branch retinal artery occlusion; RT retinal tear; SB scleral buckle; PPV pars plana vitrectomy; VH Vitreous hemorrhage; PRP panretinal laser photocoagulation; IVK intravitreal kenalog; VMT vitreomacular traction; MH Macular hole;  NVD neovascularization of the disc; NVE neovascularization elsewhere; AREDS age related eye disease study; ARMD age related macular degeneration; POAG primary open angle glaucoma; EBMD epithelial/anterior basement membrane dystrophy; ACIOL anterior chamber intraocular lens; IOL intraocular lens; PCIOL posterior chamber intraocular lens; Phaco/IOL phacoemulsification with intraocular lens placement; [place] photorefractive keratectomy; LASIK laser assisted in situ keratomileusis; HTN hypertension; DM diabetes mellitus; COPD chronic obstructive pulmonary disease

## 2019-07-01 ENCOUNTER — Encounter (INDEPENDENT_AMBULATORY_CARE_PROVIDER_SITE_OTHER): Payer: Self-pay | Admitting: Ophthalmology

## 2019-07-01 ENCOUNTER — Ambulatory Visit (INDEPENDENT_AMBULATORY_CARE_PROVIDER_SITE_OTHER): Payer: Medicare Other | Admitting: Ophthalmology

## 2019-07-01 DIAGNOSIS — H3581 Retinal edema: Secondary | ICD-10-CM

## 2019-07-01 DIAGNOSIS — H353211 Exudative age-related macular degeneration, right eye, with active choroidal neovascularization: Secondary | ICD-10-CM

## 2019-07-01 DIAGNOSIS — H43813 Vitreous degeneration, bilateral: Secondary | ICD-10-CM | POA: Diagnosis not present

## 2019-07-01 DIAGNOSIS — H353122 Nonexudative age-related macular degeneration, left eye, intermediate dry stage: Secondary | ICD-10-CM | POA: Diagnosis not present

## 2019-07-01 DIAGNOSIS — Z961 Presence of intraocular lens: Secondary | ICD-10-CM

## 2019-07-01 DIAGNOSIS — H401331 Pigmentary glaucoma, bilateral, mild stage: Secondary | ICD-10-CM

## 2019-07-01 MED ORDER — AFLIBERCEPT 2MG/0.05ML IZ SOLN FOR KALEIDOSCOPE
2.0000 mg | INTRAVITREAL | Status: AC | PRN
  Administered 2019-07-01: 2 mg via INTRAVITREAL

## 2019-07-02 ENCOUNTER — Other Ambulatory Visit: Payer: Self-pay

## 2019-07-02 DIAGNOSIS — Z170 Estrogen receptor positive status [ER+]: Secondary | ICD-10-CM

## 2019-07-02 DIAGNOSIS — C50411 Malignant neoplasm of upper-outer quadrant of right female breast: Secondary | ICD-10-CM

## 2019-07-02 NOTE — Progress Notes (Signed)
[facility]   [phone] [phone]   Clinic Follow up Note   Patient Care Team: [name], MD as PCP - General Skeet Latch, MD as PCP - Cardiology (Cardiology) Rockwell Germany, RN as Oncology Nurse Navigator Mauro Kaufmann, RN as Oncology Nurse Navigator Jovita Kussmaul, MD as Consulting Physician (General Surgery) [name], MD as Consulting Physician (Hematology) Eppie Gibson, MD as Attending Physician (Radiation Oncology)  Date of Service:  07/03/2019  CHIEF COMPLAINT: F/u of right breast cancer  SUMMARY OF ONCOLOGIC HISTORY: Oncology History Overview Note  Cancer Staging Malignant neoplasm of upper-outer quadrant of right breast in female, estrogen receptor positive ([place]) Staging form: Breast, AJCC 8th Edition - Clinical: Stage IIA (cT1c, cN1, cM0, G3, ER+, PR+, HER2-) - Signed by [name], MD on 04/09/2019 - Pathologic stage from 06/19/2019: Stage IB (pT1c, pN2a, cM0, G2, ER+, PR+, HER2-) - Signed by [name], MD on 07/03/2019    Malignant neoplasm of upper-outer quadrant of right breast in female, estrogen receptor positive ([place])  03/19/2019 Mammogram   Diagnostic Mammogram 03/19/19  IMPRESSION The 2 cm distortion in the  right breast 9:30 position middle depth 3cm from the nipple is indeterminate. [place] recommended.  There is also a 1.2cmx3.2cm enlarged right axillary LN highly suggestive of malignancy.     03/31/2019 Initial Biopsy   Diagnosis 03/31/19  1. Breast, right, needle core biopsy, 9:30 o'clock, mid depth, 3cmfn - INVASIVE DUCTAL CARCINOMA. - LYMPHOVASCULAR INVASION IS IDENTIFIED. - SEE COMMENT. 2. Lymph node, needle/core biopsy, right axilla - INVASIVE DUCTAL CARCINOMA. - SEE COMMENT.   03/31/2019 Receptors her2   1. PROGNOSTIC INDICATORS Results: IMMUNOHISTOCHEMICAL AND MORPHOMETRIC ANALYSIS PERFORMED MANUALLY The tumor cells are NEGATIVE for Her2 (0). Estrogen Receptor: 100%, POSITIVE, STRONG STAINING  INTENSITY Progesterone Receptor: 10%, POSITIVE, STRONG STAINING INTENSITY Proliferation Marker Ki67: 10%  2. PROGNOSTIC INDICATORS Results: IMMUNOHISTOCHEMICAL AND MORPHOMETRIC ANALYSIS PERFORMED MANUALLY Estrogen Receptor: 100%, POSITIVE, STRONG STAINING INTENSITY Progesterone Receptor: 0%, NEGATIVE Proliferation Marker Ki67: 40%   04/03/2019 Initial Diagnosis   Malignant neoplasm of upper-outer quadrant of right breast in female, estrogen receptor positive ([place])   04/09/2019 Cancer Staging   Staging form: Breast, AJCC 8th Edition - Clinical: Stage IIA (cT1c, cN1, cM0, G3, ER+, PR+, HER2-) - Signed by [name], MD on 04/09/2019  Staging form: Breast, AJCC 8th Edition - Clinical: Stage IIA (cT1c, cN1, cM0, G3, ER+, PR+, HER2-) - Signed by [name], MD on 04/09/2019   03/2019 -  Anti-estrogen oral therapy   Tamoxifen 58m daily starting 03/2019. Will hold starting 05/30/19 for surgery. Restarted in 07/2019   06/19/2019 Surgery   RIGHT BREAST LUMPECTOMY WITH RADIOACTIVE SEED AND SENTINEL LYMPH NODE BIOPSY by Dr [name] 06/19/19    06/19/2019 Pathology Results   FINAL MICROSCOPIC DIAGNOSIS: 06/19/19  A. BREAST, RIGHT, LUMPECTOMY:  -  Mucinous carcinoma, Nottingham grade 2  -  Margins uninvolved by carcinoma (0.2 cm; medial margin)  -  Lymphovascular space invasion present  -  Previous biopsy site changes present  -  See oncology table and comment below   B. LYMPH NODE, RIGHT AXILLARY #1, SENTINEL, BIOPSY:  -  Metastatic carcinoma involving two lymph nodes (2/2)  -  Extracapsular extension present  -  See comment   C. BREAST, RIGHT MEDIAL MARGIN, EXCISION:  -  Residual invasive carcinoma  -  Usual ductal hyperplasia with calcifications  -  See comment   D. LYMPH NODE, RIGHT AXILLARY #2, SENTINEL, BIOPSY:  -  Metastatic carcinoma involving one lymph node (1/1)   E. LYMPH NODE, RIGHT AXILLARY #1 , BIOPSY:  -  Metastatic carcinoma involving one lymph node (1/1)   F. LYMPH  NODE, RIGHT AXILLARY #2, BIOPSY:  -  No carcinoma identified in one lymph node (0/1)    06/19/2019 Cancer Staging   Staging form: Breast, AJCC 8th Edition - Pathologic stage from 06/19/2019: Stage IB (pT1c, pN2a, cM0, G2, ER+, PR+, HER2-) - Signed by [name], MD on 07/03/2019      CURRENT THERAPY:  Tamoxifen 79m daily starting 03/2019. Will hold starting 05/30/19 for surgery, plan to restart in 07/2019.    INTERVAL HISTORY:  PKajal ScaliciHypes is here for a follow up post surgery. She presents to the clinic with her son. Her surgery went well. She has right axillary and chest wall soreness. She notes pain when lifting her right arm up.  Her son's concern is she is frail and weak since her surgery. She notes stable chronic back pain which is manageable.   REVIEW OF SYSTEMS:   Constitutional: Denies fevers, chills or abnormal weight loss Eyes: Denies blurriness of vision Ears, nose, mouth, throat, and face: Denies mucositis or sore throat Respiratory: Denies cough, dyspnea or wheezes Cardiovascular: Denies palpitation, chest discomfort or lower extremity swelling Gastrointestinal:  Denies nausea, heartburn or change in bowel habits Skin: Denies abnormal skin rashes MSK: (+) Stable chronic back pain  Lymphatics: Denies new lymphadenopathy or easy bruising Neurological:Denies numbness, tingling or new weaknesses  Behavioral/Psych: Mood is stable, no new changes  Breast: (+) Right axilla and chest wall soreness, she has limited ROM of right shoulder All other systems were reviewed with the patient and are negative.  MEDICAL HISTORY:  Past Medical History:  Diagnosis Date  . Anxiety   . Arthritis   . CAD (coronary artery disease)    a. 03/2017: 95% LCx stenosis --> PCI/DES placed  . Cataract   . Chest pain 03/27/2017  . Chronic diastolic heart failure ([place] 06/25/2017  . Chronic lower back pain   . COPD (chronic obstructive pulmonary disease) ([place]   . Coronary artery disease   .  Diverticulitis   . Diverticulosis   . GERD (gastroesophageal reflux disease)   . Glaucoma   . Heart failure, type unknown ([place] 03/11/2017  . Hyperlipidemia 03/11/2017  . Hypertension   . Hypothyroidism   . Macular degeneration    wet and dry per pt's son  . Pneumonia X 1   "years and years ago" (03/26/2017)  . Shortness of breath 03/11/2017  . Status post dilation of esophageal narrowing     SURGICAL HISTORY: Past Surgical History:  Procedure Laterality Date  . BILATERAL OOPHORECTOMY Bilateral   . BREAST LUMPECTOMY WITH RADIOACTIVE SEED AND SENTINEL LYMPH NODE BIOPSY Right 06/19/2019   Procedure: RIGHT BREAST LUMPECTOMY WITH RADIOACTIVE SEED AND SENTINEL LYMPH NODE BIOPSY;  Surgeon: TJovita Kussmaul MD;  [facility]  Service: General;  Laterality: Right;  . CATARACT EXTRACTION    . CATARACT EXTRACTION W/ INTRAOCULAR LENS  IMPLANT, BILATERAL Bilateral   . CORONARY ANGIOPLASTY WITH STENT PLACEMENT  03/26/2017  . LEFT HEART CATH AND CORONARY ANGIOGRAPHY N/A 03/26/2017   Procedure: Left Heart Cath and Coronary Angiography;  Surgeon: SBelva Crome MD;  [facility];  Service: Cardiovascular;  Laterality: N/A;  . NERVE SURGERY     "lump on my sciatic nerve was removed years ago"  . SHOULDER OPEN ROTATOR CUFF REPAIR Right   .  THYROIDECTOMY, PARTIAL      I have reviewed the social history and family history with the patient and they are unchanged from previous note.  ALLERGIES:  is allergic to lipitor [atorvastatin].  MEDICATIONS:  Current Outpatient Medications  Medication Sig Dispense Refill  . albuterol (PROVENTIL HFA;VENTOLIN HFA) 108 (90 Base) MCG/ACT inhaler Inhale 2 puffs into the lungs every 6 (six) hours as needed (for shortness of breath/wheezing.).     Marland Kitchen aspirin EC 81 MG tablet Take 81 mg by mouth daily.    . brimonidine (ALPHAGAN) 0.2 % ophthalmic solution Place 1 drop into the left eye 2 (two) times daily.     . budesonide-formoterol (SYMBICORT) 160-4.5  MCG/ACT inhaler Inhale 2 puffs into the lungs 2 (two) times daily.    . Ca Carbonate-Mag Hydroxide (ROLAIDS PO) Take 1-2 tablets by mouth daily as needed (acid reflux).    . Calcium Carb-Cholecalciferol (CALCIUM + D3) 600-200 MG-UNIT TABS Take 1 tablet by mouth daily.    . carvedilol (COREG) 3.125 MG tablet TAKE 1 TABLET BY MOUTH TWICE DAILY (Patient taking differently: Take 3.125 mg by mouth See admin instructions. Take 3.125 in the morning and may take a second 3.125 mg dose in the evening as needed for SBP over 125) 180 tablet 3  . Cholecalciferol (VITAMIN D3) 2000 units capsule Take 4,000 Units by mouth daily.    . Coenzyme Q10 (COQ10) 100 MG CAPS Take 100 mg by mouth daily.    Marland Kitchen HYDROcodone-acetaminophen (NORCO/VICODIN) 5-325 MG tablet Take 1-2 tablets by mouth every 6 (six) hours as needed for moderate pain or severe pain. 10 tablet 0  . latanoprost (XALATAN) 0.005 % ophthalmic solution Place 1 drop into both eyes at bedtime.    Marland Kitchen levothyroxine (SYNTHROID, LEVOTHROID) 100 MCG tablet Take 100 mcg by mouth daily before breakfast.   0  . LORazepam (ATIVAN) 0.5 MG tablet Take 0.5 mg by mouth daily at 2 PM.     . LUTEIN PO Take 1 tablet by mouth daily.    . mirtazapine (REMERON) 15 MG tablet Take 15 mg by mouth at bedtime.    . Multiple Vitamin (MULTIVITAMIN) tablet Take 1 tablet by mouth daily.    . nitroGLYCERIN (NITROSTAT) 0.4 MG SL tablet Place 1 tablet (0.4 mg total) under the tongue every 5 (five) minutes as needed for chest pain. 25 tablet 3  . Omega-3 Fatty Acids (FISH OIL PO) Take 1 capsule by mouth daily.    . pravastatin (PRAVACHOL) 40 MG tablet TAKE 1 TABLET BY MOUTH DAILY. GENERIC EQUIVALENT FOR PRAVACHOL. NEW DOSE, DISCONTINUE PREVIOUS PRESCRIPTION (Patient taking differently: Take 40 mg by mouth at bedtime. ) 90 tablet 1  . tamoxifen (NOLVADEX) 20 MG tablet Take 1 tablet (20 mg total) by mouth daily. 30 tablet 3  . TOVIAZ 4 MG TB24 tablet Take 4 mg by mouth daily.   1   Current  Facility-Administered Medications  Medication Dose Route Frequency Provider Last Rate Last Dose  . aflibercept (EYLEA) SOLN 2 mg  2 mg Intravitreal  Bernarda Caffey, MD   2 mg at 04/16/18 1424  . aflibercept (EYLEA) SOLN 2 mg  2 mg Intravitreal  Bernarda Caffey, MD   2 mg at 05/14/18 1358  . aflibercept (EYLEA) SOLN 2 mg  2 mg Intravitreal  Bernarda Caffey, MD   2 mg at 06/25/18 1433  . aflibercept (EYLEA) SOLN 2 mg  2 mg Intravitreal  Bernarda Caffey, MD   2 mg at 08/07/18 1557  . aflibercept (EYLEA)  SOLN 2 mg  2 mg Intravitreal  Bernarda Caffey, MD   2 mg at 10/11/18 1522  . Bevacizumab (AVASTIN) SOLN 1.25 mg  1.25 mg Intravitreal  Bernarda Caffey, MD   1.25 mg at 01/07/18 1429  . Bevacizumab (AVASTIN) SOLN 1.25 mg  1.25 mg Intravitreal  Bernarda Caffey, MD   1.25 mg at 02/19/18 1356  . Bevacizumab (AVASTIN) SOLN 1.25 mg  1.25 mg Intravitreal  Bernarda Caffey, MD   1.25 mg at 03/19/18 1347    PHYSICAL EXAMINATION: ECOG PERFORMANCE STATUS: 1 - Symptomatic but completely ambulatory  Vitals:   07/03/19 1300  BP: 118/73  Pulse: 80  Resp: 18  Temp: 98.5 F (36.9 C)  SpO2: 98%   Filed Weights   07/03/19 1300  Weight: 142 lb 6.4 oz (64.6 kg)    GENERAL:alert, no distress and comfortable SKIN: skin color, texture, turgor are normal, no rashes or significant lesions EYES: normal, Conjunctiva are pink and non-injected, sclera clear  NECK: supple, thyroid normal size, non-tender, without nodularity LYMPH:  no palpable lymphadenopathy in the cervical, axillary  LUNGS: clear to auscultation and percussion with normal breathing effort HEART: regular rate & rhythm and no murmurs and no lower extremity edema ABDOMEN:abdomen soft, non-tender and normal bowel sounds Musculoskeletal:no cyanosis of digits and no clubbing  NEURO: alert & oriented x 3 with fluent speech, no focal motor/sensory deficits BREAST: S/p right lumpectomy: Surgical incision of right breast and right axilla healing very well, tender with  hard scar tissue (+) Skin ecchymosis of upper right chest. No palpable mass, nodules or adenopathy bilaterally. Breast exam benign.   LABORATORY DATA:  I have reviewed the data as listed CBC Latest Ref Rng & Units 07/03/2019 06/16/2019 04/09/2019  WBC 4.0 - 10.5 K/uL 6.6 6.8 6.0  Hemoglobin 12.0 - 15.0 g/dL 12.9 13.1 13.6  Hematocrit 36.0 - 46.0 % 38.2 39.2 41.6  Platelets 150 - 400 K/uL 248 254 280     CMP Latest Ref Rng & Units 07/03/2019 06/16/2019 04/09/2019  Glucose 70 - 99 mg/dL 84 111(H) 86  BUN 8 - 23 mg/dL _0 Creatinine 0.44 - 1.00 mg/dL 0.74 0.85 0.77  Sodium 135 - 145 mmol/L 140 139 139  Potassium 3.5 - 5.1 mmol/L 4.1 4.5 4.4  Chloride 98 - 111 mmol/L 108 105 104  CO2 22 - 32 mmol/L _1 Calcium 8.9 - 10.3 mg/dL 9.3 9.5 9.8  Total Protein 6.5 - 8.1 g/dL 7.0 - 7.5  Total Bilirubin 0.3 - 1.2 mg/dL 0.3 - 0.3  Alkaline Phos 38 - 126 U/L 53 - 80  AST 15 - 41 U/L 22 - 26  ALT 0 - 44 U/L 12 - 15      RADIOGRAPHIC STUDIES: I have personally reviewed the radiological images as listed and agreed with the findings in the report. No results found.   ASSESSMENT & PLAN:  Cortni Tays Carrara is a 85 y.o. female with   1.Malignant neoplasm of upper-outer quadrant of right breast,invasive ductal carcinoma,Stage IIA,p(T1cN2a, ER/PR+, HER2-,Grade II -She was diagnosed in 03/2019. She has invasiveductalcarcinoma which has spreadto her local LN.  -She underwent right breast lumpectomy with SLNB by Dr Marlou Starks on 06/19/19.  -She is recovering moderately well from surgery.  I will refer her to PT to help her ROM and breast soreness. I encouraged her to be active to maintain her strength.  -We discussed her pathology which showed her tumor was completely removed, she had clear margins but 4  positive lymph nodes.  -Given her multiple (4) positive lymph nodes, I recommend staging scan with PET or CT CAP/bone scan to rule out distant metastasis. She is agreeable.  -I discussed the  indicated standard treatment would include adjuvant chemotherapy and radiation but given her age, lower performance status and comorbidities, adjuvant chemo is not recommended  -She started Tamoxifen before surgery in 03/2019. She stopped right before surgery. Plan to restart in 07/2019.  -F/u in 2 months with PET scan before visit    2. CAD, HTN, Thyroid dysfunction -She has had a cardiac stent placed in the past  -She is on Coreg, nitro, pravastatin and other meds for her heart and thyroid medication -She will continue to be closely followed by her cardiologist.  -Her son notes her BP is very high after she takes thyroid medication. I encouraged her to notify her PCP.  -Pt notes her BP is high on Tamoxifen. I recommend she check her BP often when she restarts Tamoxifen. I encouraged her to contact me if this is persistently high.   3. COPD, Long Term smoking -On inhalers, not on home oxygen  -She still smokes occasionally. She has not completely quit.   4. Underweight -After drastic change in diet she had lowered appetite and lost significant weight over a year. She has started to gain some of that weight back.  -She is still able to be independent and take care of herself and her home. She lives alone with good support and help from her son who stays 45 minutes away from her. -She is on Mirtazapine for appetite and sleep. I strongly encouraged her to take more nutritional supplements.  -She has been weak since her surgery. I will refer her to PT to help her be more active and maintain her strength.    5. Osteoporosis, Arthritis  -She receives Proliainjectionsq48month with Dr. WDema Severin  -She had fracture of her wristsin 2019 -Her joint pain is mainly in her left hand and lower back when she stands for a long time.Stable    6. Macular Degeneration of both eyes, hearingloss -Managed by her Ophthalmologist   7.Anxiety/depression  -OnZoloft, mood stable -We  discussed thatzoloft has very mild interaction with Tamoxifen, will monitor for now   PLAN: -Send PT referral  -PET scan or CT CAP W Contrast and bone scan (if PET denied by insurance) before next visit  -Restart Tamoxifen in 07/2019  -Lab and f/u in 2 months    No problem-specific Assessment & Plan notes found for this encounter.   Orders Placed This Encounter  Procedures  . NM PET Image Initial (PI) Skull Base To Thigh    Standing Status:   Future    Standing Expiration Date:   07/02/2020    Order Specific Question:   If indicated for the ordered procedure, I authorize the administration of a radiopharmaceutical per Radiology protocol    Answer:   Yes    Order Specific Question:   Preferred imaging [facility]?    Answer[facility]   [facility]   Order Specific Question:   Radiology Contrast Protocol - do NOT remove file path    Answer:   [email or web address]   All questions were answered. The patient knows to call the clinic with any problems, questions or concerns. No barriers to learning was detected. I spent 20 minutes counseling the patient face to face. The total time spent in the appointment was 25 minutes and more than 50% was on counseling and review of test  results     [name], MD 07/03/2019   I, Joslyn Devon, am acting as scribe for [name], MD.   I have reviewed the above documentation for accuracy and completeness, and I agree with the above.

## 2019-07-03 ENCOUNTER — Inpatient Hospital Stay: Payer: Medicare Other | Attending: Hematology

## 2019-07-03 ENCOUNTER — Other Ambulatory Visit: Payer: Self-pay | Admitting: Hematology

## 2019-07-03 ENCOUNTER — Inpatient Hospital Stay: Payer: Medicare Other | Admitting: Hematology

## 2019-07-03 ENCOUNTER — Other Ambulatory Visit: Payer: Self-pay

## 2019-07-03 VITALS — BP 118/73 | HR 80 | Temp 98.5°F | Resp 18 | Ht 70.0 in | Wt 142.4 lb

## 2019-07-03 DIAGNOSIS — M1990 Unspecified osteoarthritis, unspecified site: Secondary | ICD-10-CM | POA: Diagnosis not present

## 2019-07-03 DIAGNOSIS — E039 Hypothyroidism, unspecified: Secondary | ICD-10-CM

## 2019-07-03 DIAGNOSIS — H3530 Unspecified macular degeneration: Secondary | ICD-10-CM | POA: Diagnosis not present

## 2019-07-03 DIAGNOSIS — M549 Dorsalgia, unspecified: Secondary | ICD-10-CM | POA: Insufficient documentation

## 2019-07-03 DIAGNOSIS — C50411 Malignant neoplasm of upper-outer quadrant of right female breast: Secondary | ICD-10-CM

## 2019-07-03 DIAGNOSIS — I10 Essential (primary) hypertension: Secondary | ICD-10-CM | POA: Diagnosis not present

## 2019-07-03 DIAGNOSIS — H9190 Unspecified hearing loss, unspecified ear: Secondary | ICD-10-CM | POA: Diagnosis not present

## 2019-07-03 DIAGNOSIS — Z170 Estrogen receptor positive status [ER+]: Secondary | ICD-10-CM | POA: Diagnosis not present

## 2019-07-03 DIAGNOSIS — Z720 Tobacco use: Secondary | ICD-10-CM | POA: Diagnosis not present

## 2019-07-03 DIAGNOSIS — R531 Weakness: Secondary | ICD-10-CM | POA: Diagnosis not present

## 2019-07-03 DIAGNOSIS — C773 Secondary and unspecified malignant neoplasm of axilla and upper limb lymph nodes: Secondary | ICD-10-CM | POA: Diagnosis not present

## 2019-07-03 DIAGNOSIS — F329 Major depressive disorder, single episode, unspecified: Secondary | ICD-10-CM | POA: Diagnosis not present

## 2019-07-03 DIAGNOSIS — I2510 Atherosclerotic heart disease of native coronary artery without angina pectoris: Secondary | ICD-10-CM | POA: Insufficient documentation

## 2019-07-03 DIAGNOSIS — F419 Anxiety disorder, unspecified: Secondary | ICD-10-CM | POA: Diagnosis not present

## 2019-07-03 DIAGNOSIS — R636 Underweight: Secondary | ICD-10-CM | POA: Insufficient documentation

## 2019-07-03 DIAGNOSIS — J449 Chronic obstructive pulmonary disease, unspecified: Secondary | ICD-10-CM | POA: Diagnosis not present

## 2019-07-03 DIAGNOSIS — M810 Age-related osteoporosis without current pathological fracture: Secondary | ICD-10-CM | POA: Insufficient documentation

## 2019-07-03 DIAGNOSIS — E079 Disorder of thyroid, unspecified: Secondary | ICD-10-CM | POA: Diagnosis not present

## 2019-07-03 DIAGNOSIS — G8929 Other chronic pain: Secondary | ICD-10-CM | POA: Diagnosis not present

## 2019-07-03 LAB — CMP (CANCER CENTER ONLY)
ALT: 12 U/L (ref 0–44)
AST: 22 U/L (ref 15–41)
Albumin: 3.2 g/dL — ABNORMAL LOW (ref 3.5–5.0)
Alkaline Phosphatase: 53 U/L (ref 38–126)
Anion gap: 7 (ref 5–15)
BUN: 19 mg/dL (ref 8–23)
CO2: 25 mmol/L (ref 22–32)
Calcium: 9.3 mg/dL (ref 8.9–10.3)
Chloride: 108 mmol/L (ref 98–111)
Creatinine: 0.74 mg/dL (ref 0.44–1.00)
GFR, Est AFR Am: >60 mL/min (ref >60)
GFR, Estimated: >60 mL/min (ref >60)
Glucose, Bld: 84 mg/dL (ref 70–99)
Potassium: 4.1 mmol/L (ref 3.5–5.1)
Sodium: 140 mmol/L (ref 135–145)
Total Bilirubin: 0.3 mg/dL (ref 0.3–1.2)
Total Protein: 7 g/dL (ref 6.5–8.1)

## 2019-07-03 LAB — CBC WITH DIFFERENTIAL (CANCER CENTER ONLY)
Abs Immature Granulocytes: 0.02 10*3/uL (ref 0.00–0.07)
Basophils Absolute: 0 10*3/uL (ref 0.0–0.1)
Basophils Relative: 1 %
Eosinophils Absolute: 0.2 10*3/uL (ref 0.0–0.5)
Eosinophils Relative: 3 %
HCT: 38.2 % (ref 36.0–46.0)
Hemoglobin: 12.9 g/dL (ref 12.0–15.0)
Immature Granulocytes: 0 %
Lymphocytes Relative: 21 %
Lymphs Abs: 1.4 10*3/uL (ref 0.7–4.0)
MCH: 31.6 pg (ref 26.0–34.0)
MCHC: 33.8 g/dL (ref 30.0–36.0)
MCV: 93.6 fL (ref 80.0–100.0)
Monocytes Absolute: 1 10*3/uL (ref 0.1–1.0)
Monocytes Relative: 15 %
Neutro Abs: 4 10*3/uL (ref 1.7–7.7)
Neutrophils Relative %: 60 %
Platelet Count: 248 10*3/uL (ref 150–400)
RBC: 4.08 MIL/uL (ref 3.87–5.11)
RDW: 13.6 % (ref 11.5–15.5)
WBC Count: 6.6 10*3/uL (ref 4.0–10.5)
nRBC: 0 % (ref 0.0–0.2)

## 2019-07-03 LAB — TSH: TSH: 0.63 u[IU]/mL (ref 0.308–3.960)

## 2019-07-03 MED ORDER — TAMOXIFEN CITRATE 20 MG PO TABS: 20.0000 mg | ORAL_TABLET | Freq: Every day | ORAL | 3 refills | Status: DC

## 2019-07-04 ENCOUNTER — Telehealth: Payer: Self-pay | Admitting: Hematology

## 2019-07-04 NOTE — Telephone Encounter (Signed)
Scheduled appt per 11/12 los.  Sent a staff message to get a calendar mailed out.

## 2019-07-05 ENCOUNTER — Encounter: Payer: Self-pay | Admitting: Hematology

## 2019-07-07 ENCOUNTER — Telehealth: Payer: Self-pay

## 2019-07-07 NOTE — Telephone Encounter (Signed)
Patient calls requesting that Dr. Burr Medico cancel the request for PET scan, she feels she has too much going on to have this done and is "not up to it".    [phone]

## 2019-07-07 NOTE — Telephone Encounter (Signed)
Yes, OK to cancel her PET, please encourage her to keep her next appointment. Thanks    [name] MD

## 2019-07-10 ENCOUNTER — Other Ambulatory Visit: Payer: Self-pay | Admitting: Hematology

## 2019-07-10 ENCOUNTER — Telehealth: Payer: Self-pay | Admitting: *Deleted

## 2019-07-10 ENCOUNTER — Telehealth: Payer: Self-pay | Admitting: Hematology

## 2019-07-10 ENCOUNTER — Other Ambulatory Visit: Payer: Self-pay | Admitting: *Deleted

## 2019-07-10 DIAGNOSIS — Z170 Estrogen receptor positive status [ER+]: Secondary | ICD-10-CM

## 2019-07-10 DIAGNOSIS — C50411 Malignant neoplasm of upper-outer quadrant of right female breast: Secondary | ICD-10-CM

## 2019-07-10 NOTE — Telephone Encounter (Signed)
Spoke with patients son to confirm appointment, he understood all instructions and location

## 2019-07-10 NOTE — Telephone Encounter (Signed)
Received call from patient's son Josph Macho stating patient is now ready to have the PET scan done.  Orders placed. Informed him we would get this scheduled.

## 2019-07-11 NOTE — Progress Notes (Signed)
Location of Breast Cancer: Right Breast  Histology per Pathology Report:  03/31/19 Diagnosis 1. Breast, right, needle core biopsy, 9:30 o'clock, mid depth, 3cmfn - INVASIVE DUCTAL CARCINOMA. - LYMPHOVASCULAR INVASION IS IDENTIFIED. - SEE COMMENT.  Receptor Status: ER(100%), PR (10%), Her2-neu (NEG), Ki- (10%)  2. Lymph node, needle/core biopsy, right axilla - INVASIVE DUCTAL CARCINOMA.  Receptor Status: ER(100%), PR (NEG), Her2-neu (NEG), Ki-67 (40%)  06/19/19 FINAL MICROSCOPIC DIAGNOSIS: A. BREAST, RIGHT, LUMPECTOMY: - Mucinous carcinoma, Nottingham grade 2 - Margins uninvolved by carcinoma (0.2 cm; medial margin) - Lymphovascular space invasion present - Previous biopsy site changes present - See oncology table and comment below B. LYMPH NODE, RIGHT AXILLARY #1, SENTINEL, BIOPSY: - Metastatic carcinoma involving two lymph nodes (2/2) - Extracapsular extension present - See comment C. BREAST, RIGHT MEDIAL MARGIN, EXCISION: - Residual invasive carcinoma - Usual ductal hyperplasia with calcifications - See comment D. LYMPH NODE, RIGHT AXILLARY #2, SENTINEL, BIOPSY: - Metastatic carcinoma involving one lymph node (1/1) E. LYMPH NODE, RIGHT AXILLARY #1 , BIOPSY: - Metastatic carcinoma involving one lymph node (1/1) F. LYMPH NODE, RIGHT AXILLARY #2, BIOPSY: - No carcinoma identified in one lymph node (0/1)  Did patient present with symptoms or was this found on screening mammography?: It was a palpable mass in the upper outer quadrant of her right breast.   Past/Anticipated interventions by surgeon, if any: 06/19/19 PROCEDURE:  Procedure(s): RIGHT BREAST LUMPECTOMY WITH RADIOACTIVE SEED LOCALIZATION AND DEEP RIGHT AXILLARY SENTINEL LYMPH NODE BIOPSY (Right) SURGEON:  Surgeon(s) and Role:    Jovita Kussmaul, MD - Primary   Past/Anticipated interventions by medical oncology, if any:  07/03/19 Dr. Burr Medico PLAN: -Send PT referral. Patient hasn't begun PT yet. -PET scan  or CT CAP W Contrast and bone scan (if PET denied by insurance) before next visit  -Restart Tamoxifen in 07/2019  -Lab and f/u in 2 months    Lymphedema issues, if any:  denies  Pain issues, if any:  Reports persistent right axilla pain related to surgery. Denies redness or warmth at the incision site. Reports the incision site is well approximated and appears to be healing without complication  SAFETY ISSUES:  Prior radiation? No  Pacemaker/ICD? No  Possible current pregnancy? No  Is the patient on methotrexate? No  Current Complaints / other details:  85 year old female.     Mal[place]elt, Stephani Police, RN 07/11/2019,3:27 PM

## 2019-07-15 ENCOUNTER — Telehealth: Payer: Self-pay | Admitting: Radiation Oncology

## 2019-07-15 NOTE — Telephone Encounter (Signed)
Contacted patient to verify telephone visit for pre reg °

## 2019-07-16 ENCOUNTER — Encounter: Payer: Self-pay | Admitting: Radiation Oncology

## 2019-07-16 ENCOUNTER — Other Ambulatory Visit: Payer: Self-pay

## 2019-07-16 ENCOUNTER — Ambulatory Visit
Admission: RE | Admit: 2019-07-16 | Discharge: 2019-07-16 | Disposition: A | Discharge status: Home / Self Care | Payer: Medicare Other | Source: Ambulatory Visit | Attending: Radiation Oncology | Admitting: Radiation Oncology

## 2019-07-16 VITALS — Ht 72.0 in | Wt 143.0 lb

## 2019-07-16 DIAGNOSIS — Z170 Estrogen receptor positive status [ER+]: Secondary | ICD-10-CM

## 2019-07-16 DIAGNOSIS — C773 Secondary and unspecified malignant neoplasm of axilla and upper limb lymph nodes: Secondary | ICD-10-CM | POA: Diagnosis not present

## 2019-07-16 DIAGNOSIS — C50411 Malignant neoplasm of upper-outer quadrant of right female breast: Secondary | ICD-10-CM | POA: Diagnosis not present

## 2019-07-16 DIAGNOSIS — Z9889 Other specified postprocedural states: Secondary | ICD-10-CM | POA: Diagnosis not present

## 2019-07-16 HISTORY — DX: Malignant neoplasm of unspecified site of unspecified female breast: C50.919

## 2019-07-16 NOTE — Progress Notes (Signed)
Radiation Oncology         [phone] ________________________________  Name: Marissa Caldwell MRN: [id number]  Date: 07/16/2019  DOB: 1932/03/26  Follow-Up Visit Note by telephone as patient was unable to access MyChart video during pandemic precautions   Outpatient  CC: [name], MD  [name], MD  Diagnosis:      ICD-10-CM   1. Malignant neoplasm of upper-outer quadrant of right breast in female, estrogen receptor positive ([place])  C50.411    Z17.0      Cancer Staging Malignant neoplasm of upper-outer quadrant of right breast in female, estrogen receptor positive ([place]) Staging form: Breast, AJCC 8th Edition - Clinical: Stage IIA (cT1c, cN1, cM0, G3, ER+, PR+, HER2-) - Signed by [name], MD on 04/09/2019 - Pathologic stage from 06/19/2019: Stage IB (pT1c, pN2a, cM0, G2, ER+, PR+, HER2-) - Signed by [name], MD on 07/03/2019   CHIEF COMPLAINT: Here to discuss management of right breast cancer  Narrative:  The patient returns today for follow-up to discuss radiation treatment options. She was seen in the multidisciplinary breast clinic on 04/09/2019.    Due to her advanced age and other medical issues, she was unsure if she wanted to proceed with surgery. She was placed on tamoxifen under Dr. Burr Medico in the interim. She met with cardiology, underwent exercise stress test on 05/08/2019, and was cleared for surgery. When she met with Dr. Burr Medico for follow up on 05/30/2019, physical exam revealed increase in size of the breast mass and right axillary lymph node and a new palpable right axillary lymph node.  She opted to proceed with right lumpectomy and sentinel lymph node biopsy on date of 06/19/2019 with pathology report revealing: tumor size of 2.0 cm; histology of mucinous carcinoma; margin status to invasive disease of 0.2 cm; nodal status of positive (4/5); Grade 2. Prognostic panel was not repeated.  She is scheduled for PET scan on 07/22/2019.  Symptomatically, the patient  reports:   Pain issues, if any:  Reports persistent right axilla pain related to surgery. Denies redness or warmth at the incision site. Reports the incision site is well approximated and appears to be healing without complication  SAFETY ISSUES:  Prior radiation? No  Pacemaker/ICD? No           ALLERGIES:  is allergic to lipitor [atorvastatin].  Meds: Current Outpatient Medications  Medication Sig Dispense Refill  . albuterol (PROVENTIL HFA;VENTOLIN HFA) 108 (90 Base) MCG/ACT inhaler Inhale 2 puffs into the lungs every 6 (six) hours as needed (for shortness of breath/wheezing.).     Marland Kitchen aspirin EC 81 MG tablet Take 81 mg by mouth daily.    . brimonidine (ALPHAGAN) 0.2 % ophthalmic solution Place 1 drop into the left eye 2 (two) times daily.     . budesonide-formoterol (SYMBICORT) 160-4.5 MCG/ACT inhaler Inhale 2 puffs into the lungs 2 (two) times daily.    . Ca Carbonate-Mag Hydroxide (ROLAIDS PO) Take 1-2 tablets by mouth daily as needed (acid reflux).    . Calcium Carb-Cholecalciferol (CALCIUM + D3) 600-200 MG-UNIT TABS Take 1 tablet by mouth daily.    . carvedilol (COREG) 3.125 MG tablet TAKE 1 TABLET BY MOUTH TWICE DAILY (Patient taking differently: Take 3.125 mg by mouth See admin instructions. Take 3.125 in the morning and may take a second 3.125 mg dose in the evening as needed for SBP over 125) 180 tablet 3  . Cholecalciferol (VITAMIN D3) 2000 units capsule Take 4,000 Units by mouth daily.    Marland Kitchen  Coenzyme Q10 (COQ10) 100 MG CAPS Take 100 mg by mouth daily.    Marland Kitchen HYDROcodone-acetaminophen (NORCO/VICODIN) 5-325 MG tablet Take 1-2 tablets by mouth every 6 (six) hours as needed for moderate pain or severe pain. 10 tablet 0  . latanoprost (XALATAN) 0.005 % ophthalmic solution Place 1 drop into both eyes at bedtime.    Marland Kitchen levothyroxine (SYNTHROID, LEVOTHROID) 100 MCG tablet Take 100 mcg by mouth daily before breakfast.   0  . LORazepam (ATIVAN) 0.5 MG tablet Take 0.5 mg by mouth daily at 2  PM.     . LUTEIN PO Take 1 tablet by mouth daily.    . mirtazapine (REMERON) 15 MG tablet Take 15 mg by mouth at bedtime.    . Multiple Vitamin (MULTIVITAMIN) tablet Take 1 tablet by mouth daily.    . nitroGLYCERIN (NITROSTAT) 0.4 MG SL tablet Place 1 tablet (0.4 mg total) under the tongue every 5 (five) minutes as needed for chest pain. 25 tablet 3  . Omega-3 Fatty Acids (FISH OIL PO) Take 1 capsule by mouth daily.    . pravastatin (PRAVACHOL) 40 MG tablet TAKE 1 TABLET BY MOUTH DAILY. GENERIC EQUIVALENT FOR PRAVACHOL. NEW DOSE, DISCONTINUE PREVIOUS PRESCRIPTION (Patient taking differently: Take 40 mg by mouth at bedtime. ) 90 tablet 1  . TOVIAZ 4 MG TB24 tablet Take 4 mg by mouth daily.   1  . tamoxifen (NOLVADEX) 20 MG tablet Take 1 tablet (20 mg total) by mouth daily. (Patient not taking: Reported on 07/16/2019) 30 tablet 3   Current Facility-Administered Medications  Medication Dose Route Frequency Provider Last Rate Last Dose  . aflibercept (EYLEA) SOLN 2 mg  2 mg Intravitreal  Bernarda Caffey, MD   2 mg at 04/16/18 1424  . aflibercept (EYLEA) SOLN 2 mg  2 mg Intravitreal  Bernarda Caffey, MD   2 mg at 05/14/18 1358  . aflibercept (EYLEA) SOLN 2 mg  2 mg Intravitreal  Bernarda Caffey, MD   2 mg at 06/25/18 1433  . aflibercept (EYLEA) SOLN 2 mg  2 mg Intravitreal  Bernarda Caffey, MD   2 mg at 08/07/18 1557  . aflibercept (EYLEA) SOLN 2 mg  2 mg Intravitreal  Bernarda Caffey, MD   2 mg at 10/11/18 1522  . Bevacizumab (AVASTIN) SOLN 1.25 mg  1.25 mg Intravitreal  Bernarda Caffey, MD   1.25 mg at 01/07/18 1429  . Bevacizumab (AVASTIN) SOLN 1.25 mg  1.25 mg Intravitreal  Bernarda Caffey, MD   1.25 mg at 02/19/18 1356  . Bevacizumab (AVASTIN) SOLN 1.25 mg  1.25 mg Intravitreal  Bernarda Caffey, MD   1.25 mg at 03/19/18 1347    Physical Findings:  height is 6' (1.829 m) and weight is 143 lb (64.9 kg). .     General: Alert and oriented, in no acute distress    Son is present for phone call   Lab  Findings: Lab Results  Component Value Date   WBC 6.6 07/03/2019   HGB 12.9 07/03/2019   HCT 38.2 07/03/2019   MCV 93.6 07/03/2019   PLT 248 07/03/2019    Radiographic Findings: Nm Sentinel Node Inj-no Rpt (breast)  Result Date: 06/19/2019 Sulfur colloid was injected by the nuclear medicine technologist for melanoma sentinel node.    Impression/Plan: Right Breast Cancer, aggressive   PET scan is pending.  She understands that if she has widespread visceral distant metastatic disease she will not necessarily be a good candidate for adjuvant radiotherapy to the breast and regional nodes.  We discussed adjuvant radiotherapy today.  I recommend 4 weeks directed at the right breast  and lymph nodes in order to reduce the risk of locoregional recurrence by 2/3.  She is with multiple comorbidities and I think a 6-week course would be difficult for her, particularly during the pandemic, so I recommend a hypofractionated course.  She understands this is not the most standard approach but there is medical literature to show that this is well-tolerated and  effective.   The risks, benefits and side effects of this treatment were discussed in detail.  She understands that radiotherapy is associated with skin irritation and fatigue in the acute setting. Late effects can include cosmetic changes and rare injury to internal organs.  She is enthusiastic about proceeding with treatment.   We will simulate her treatment after her PET scan   This encounter was provided by telemedicine platform by telephone as patient was unable to access MyChart video during pandemic precautions The patient has given verbal consent for this type of encounter and has been advised to only accept a meeting of this type in a secure network environment. The time spent during this encounter was 15 minutes. The attendants for this meeting include Eppie Gibson  and Danyah A Moretto.  Her son was present as well for the call.  During the encounter, Eppie Gibson was located at [facility] Radiation Oncology Department.  Ciearra A Sieler was located at home.  _____________________________________   Eppie Gibson, MD   This document serves as a record of services personally performed by Eppie Gibson, MD. It was created on her behalf by Wilburn Mylar, a trained medical scribe. The creation of this record is based on the scribe's personal observations and the provider's statements to them. This document has been checked and approved by the attending provider.

## 2019-07-16 NOTE — Progress Notes (Signed)
See progress note under physician encounter. 

## 2019-07-22 ENCOUNTER — Other Ambulatory Visit: Payer: Self-pay

## 2019-07-22 ENCOUNTER — Ambulatory Visit (HOSPITAL_COMMUNITY)
Admission: RE | Admit: 2019-07-22 | Discharge: 2019-07-22 | Disposition: A | Discharge status: Home / Self Care | Payer: Medicare Other | Source: Ambulatory Visit | Attending: Hematology | Admitting: Hematology

## 2019-07-22 DIAGNOSIS — Z79899 Other long term (current) drug therapy: Secondary | ICD-10-CM | POA: Diagnosis not present

## 2019-07-22 DIAGNOSIS — I110 Hypertensive heart disease with heart failure: Secondary | ICD-10-CM | POA: Diagnosis not present

## 2019-07-22 DIAGNOSIS — Z7982 Long term (current) use of aspirin: Secondary | ICD-10-CM | POA: Diagnosis not present

## 2019-07-22 DIAGNOSIS — I5032 Chronic diastolic (congestive) heart failure: Secondary | ICD-10-CM | POA: Insufficient documentation

## 2019-07-22 DIAGNOSIS — C50411 Malignant neoplasm of upper-outer quadrant of right female breast: Secondary | ICD-10-CM | POA: Insufficient documentation

## 2019-07-22 DIAGNOSIS — Z79890 Hormone replacement therapy (postmenopausal): Secondary | ICD-10-CM | POA: Insufficient documentation

## 2019-07-22 DIAGNOSIS — E785 Hyperlipidemia, unspecified: Secondary | ICD-10-CM | POA: Diagnosis not present

## 2019-07-22 DIAGNOSIS — E039 Hypothyroidism, unspecified: Secondary | ICD-10-CM | POA: Diagnosis not present

## 2019-07-22 DIAGNOSIS — I700 Atherosclerosis of aorta: Secondary | ICD-10-CM | POA: Diagnosis not present

## 2019-07-22 DIAGNOSIS — C50919 Malignant neoplasm of unspecified site of unspecified female breast: Secondary | ICD-10-CM | POA: Diagnosis not present

## 2019-07-22 DIAGNOSIS — R911 Solitary pulmonary nodule: Secondary | ICD-10-CM | POA: Insufficient documentation

## 2019-07-22 DIAGNOSIS — Z170 Estrogen receptor positive status [ER+]: Secondary | ICD-10-CM | POA: Insufficient documentation

## 2019-07-22 DIAGNOSIS — I2510 Atherosclerotic heart disease of native coronary artery without angina pectoris: Secondary | ICD-10-CM | POA: Diagnosis not present

## 2019-07-22 LAB — GLUCOSE, CAPILLARY: Glucose-Capillary: 101 mg/dL — ABNORMAL HIGH (ref 70–99)

## 2019-07-22 MED ORDER — FLUDEOXYGLUCOSE F - 18 (FDG) INJECTION
7.1300 | Freq: Once | INTRAVENOUS | Status: AC | PRN
  Administered 2019-07-22: 10:00:00 7.13 via INTRAVENOUS

## 2019-07-30 ENCOUNTER — Other Ambulatory Visit: Payer: Self-pay

## 2019-07-30 ENCOUNTER — Ambulatory Visit
Admission: RE | Admit: 2019-07-30 | Discharge: 2019-07-30 | Disposition: A | Discharge status: Home / Self Care | Payer: Medicare Other | Source: Ambulatory Visit | Attending: Radiation Oncology | Admitting: Radiation Oncology

## 2019-07-30 DIAGNOSIS — C50411 Malignant neoplasm of upper-outer quadrant of right female breast: Secondary | ICD-10-CM | POA: Insufficient documentation

## 2019-07-30 DIAGNOSIS — Z510 Encounter for antineoplastic radiation therapy: Secondary | ICD-10-CM | POA: Diagnosis not present

## 2019-07-30 DIAGNOSIS — Z170 Estrogen receptor positive status [ER+]: Secondary | ICD-10-CM | POA: Diagnosis not present

## 2019-07-30 NOTE — Progress Notes (Signed)
  Radiation Oncology         [phone] ________________________________  Name: Marissa Caldwell MRN: [id number]  Date: 07/30/2019  DOB: 1931-12-21  SIMULATION AND TREATMENT PLANNING NOTE    Outpatient  DIAGNOSIS:     ICD-10-CM   1. Malignant neoplasm of upper-outer quadrant of right breast in female, estrogen receptor positive ([place])  C50.411    Z17.0     NARRATIVE:  The patient was brought to the [place].  Identity was confirmed.  All relevant records and images related to the planned course of therapy were reviewed.  The patient freely provided informed written consent to proceed with treatment after reviewing the details related to the planned course of therapy. The consent form was witnessed and verified by the simulation staff.    Then, the patient was set-up in a stable reproducible supine position for radiation therapy with her ipsilateral arm over her head, and her upper body secured in a custom-made Vac-lok device.  CT images were obtained.  Surface markings were placed.  The CT images were loaded into the planning software.    TREATMENT PLANNING NOTE: Treatment planning then occurred.  The radiation prescription was entered and confirmed.     A total of 5 medically necessary complex treatment devices were fabricated and supervised by me: 4 fields with MLCs for custom blocks to protect heart, and lungs;  and, a Vac-lok. MORE COMPLEX DEVICES MAY BE MADE IN DOSIMETRY FOR FIELD IN FIELD BEAMS FOR DOSE HOMOGENEITY.  I have requested : 3D Simulation which is medically necessary to give adequate dose to at risk tissues while sparing lungs and heart.  I have requested a DVH of the following structures: lungs, heart, right lumpectomy cavity.    The patient will receive 42.56 Gy in 16 fractions to the right breast and regional nodes with 4 fields.  This will be followed by a boost.  Optical Surface Tracking Plan:  Since intensity modulated radiotherapy (IMRT) and 3D  conformal radiation treatment methods are predicated on accurate and precise positioning for treatment, intrafraction motion monitoring is medically necessary to ensure accurate and safe treatment delivery. The ability to quantify intrafraction motion without excessive ionizing radiation dose can only be performed with optical surface tracking. Accordingly, surface imaging offers the opportunity to obtain 3D measurements of patient position throughout IMRT and 3D treatments without excessive radiation exposure. I am ordering optical surface tracking for this patient's upcoming course of radiotherapy.  ________________________________   Reference:  Ursula Alert, J, et al. Surface imaging-based analysis of intrafraction motion for breast radiotherapy patients.Journal of [employer], n. 6, nov. 2014. ISSN [id number].  Available at: <http://www.jacmp.org/index.php/jacmp/article/view/4957>.    -----------------------------------  Eppie Gibson, MD

## 2019-07-31 ENCOUNTER — Ambulatory Visit: Payer: Medicare Other | Admitting: Hematology

## 2019-08-01 DIAGNOSIS — Z170 Estrogen receptor positive status [ER+]: Secondary | ICD-10-CM | POA: Diagnosis not present

## 2019-08-01 DIAGNOSIS — Z510 Encounter for antineoplastic radiation therapy: Secondary | ICD-10-CM | POA: Diagnosis not present

## 2019-08-01 DIAGNOSIS — C50411 Malignant neoplasm of upper-outer quadrant of right female breast: Secondary | ICD-10-CM | POA: Diagnosis not present

## 2019-08-04 ENCOUNTER — Ambulatory Visit
Admission: RE | Admit: 2019-08-04 | Discharge: 2019-08-04 | Disposition: A | Discharge status: Home / Self Care | Payer: Medicare Other | Source: Ambulatory Visit | Attending: Radiation Oncology | Admitting: Radiation Oncology

## 2019-08-04 ENCOUNTER — Other Ambulatory Visit: Payer: Self-pay

## 2019-08-04 DIAGNOSIS — Z170 Estrogen receptor positive status [ER+]: Secondary | ICD-10-CM | POA: Diagnosis not present

## 2019-08-04 DIAGNOSIS — C50411 Malignant neoplasm of upper-outer quadrant of right female breast: Secondary | ICD-10-CM | POA: Diagnosis not present

## 2019-08-04 DIAGNOSIS — Z510 Encounter for antineoplastic radiation therapy: Secondary | ICD-10-CM | POA: Diagnosis not present

## 2019-08-04 LAB — SURGICAL PATHOLOGY

## 2019-08-05 ENCOUNTER — Other Ambulatory Visit: Payer: Self-pay

## 2019-08-05 ENCOUNTER — Ambulatory Visit
Admission: RE | Admit: 2019-08-05 | Discharge: 2019-08-05 | Disposition: A | Discharge status: Home / Self Care | Payer: Medicare Other | Source: Ambulatory Visit | Attending: Radiation Oncology | Admitting: Radiation Oncology

## 2019-08-05 DIAGNOSIS — C50411 Malignant neoplasm of upper-outer quadrant of right female breast: Secondary | ICD-10-CM | POA: Diagnosis not present

## 2019-08-05 DIAGNOSIS — Z510 Encounter for antineoplastic radiation therapy: Secondary | ICD-10-CM | POA: Diagnosis not present

## 2019-08-05 DIAGNOSIS — Z170 Estrogen receptor positive status [ER+]: Secondary | ICD-10-CM | POA: Diagnosis not present

## 2019-08-06 ENCOUNTER — Other Ambulatory Visit: Payer: Self-pay

## 2019-08-06 ENCOUNTER — Ambulatory Visit
Admission: RE | Admit: 2019-08-06 | Discharge: 2019-08-06 | Disposition: A | Discharge status: Home / Self Care | Payer: Medicare Other | Source: Ambulatory Visit | Attending: Radiation Oncology | Admitting: Radiation Oncology

## 2019-08-06 DIAGNOSIS — C50411 Malignant neoplasm of upper-outer quadrant of right female breast: Secondary | ICD-10-CM | POA: Diagnosis not present

## 2019-08-06 DIAGNOSIS — Z170 Estrogen receptor positive status [ER+]: Secondary | ICD-10-CM | POA: Diagnosis not present

## 2019-08-06 DIAGNOSIS — Z510 Encounter for antineoplastic radiation therapy: Secondary | ICD-10-CM | POA: Diagnosis not present

## 2019-08-07 ENCOUNTER — Other Ambulatory Visit: Payer: Self-pay

## 2019-08-07 ENCOUNTER — Ambulatory Visit
Admission: RE | Admit: 2019-08-07 | Discharge: 2019-08-07 | Disposition: A | Discharge status: Home / Self Care | Payer: Medicare Other | Source: Ambulatory Visit | Attending: Radiation Oncology | Admitting: Radiation Oncology

## 2019-08-07 DIAGNOSIS — Z510 Encounter for antineoplastic radiation therapy: Secondary | ICD-10-CM | POA: Diagnosis not present

## 2019-08-07 DIAGNOSIS — Z170 Estrogen receptor positive status [ER+]: Secondary | ICD-10-CM | POA: Diagnosis not present

## 2019-08-07 DIAGNOSIS — C50411 Malignant neoplasm of upper-outer quadrant of right female breast: Secondary | ICD-10-CM | POA: Diagnosis not present

## 2019-08-08 ENCOUNTER — Ambulatory Visit
Admission: RE | Admit: 2019-08-08 | Discharge: 2019-08-08 | Disposition: A | Discharge status: Home / Self Care | Payer: Medicare Other | Source: Ambulatory Visit | Attending: Radiation Oncology | Admitting: Radiation Oncology

## 2019-08-08 ENCOUNTER — Other Ambulatory Visit: Payer: Self-pay

## 2019-08-08 DIAGNOSIS — Z170 Estrogen receptor positive status [ER+]: Secondary | ICD-10-CM | POA: Diagnosis not present

## 2019-08-08 DIAGNOSIS — Z510 Encounter for antineoplastic radiation therapy: Secondary | ICD-10-CM | POA: Diagnosis not present

## 2019-08-08 DIAGNOSIS — C50411 Malignant neoplasm of upper-outer quadrant of right female breast: Secondary | ICD-10-CM | POA: Diagnosis not present

## 2019-08-11 ENCOUNTER — Ambulatory Visit
Admission: RE | Admit: 2019-08-11 | Discharge: 2019-08-11 | Disposition: A | Discharge status: Home / Self Care | Payer: Medicare Other | Source: Ambulatory Visit | Attending: Radiation Oncology | Admitting: Radiation Oncology

## 2019-08-11 DIAGNOSIS — Z170 Estrogen receptor positive status [ER+]: Secondary | ICD-10-CM

## 2019-08-11 DIAGNOSIS — Z510 Encounter for antineoplastic radiation therapy: Secondary | ICD-10-CM | POA: Diagnosis not present

## 2019-08-11 DIAGNOSIS — C50411 Malignant neoplasm of upper-outer quadrant of right female breast: Secondary | ICD-10-CM | POA: Diagnosis not present

## 2019-08-11 MED ORDER — SONAFINE EX EMUL
1.0000 "application " | Freq: Two times a day (BID) | CUTANEOUS | Status: DC
  Administered 2019-08-11: 1 via TOPICAL

## 2019-08-11 MED ORDER — ALRA NON-METALLIC DEODORANT (RAD-ONC)
1.0000 "application " | Freq: Once | TOPICAL | Status: AC
  Administered 2019-08-11: 1 via TOPICAL

## 2019-08-12 ENCOUNTER — Ambulatory Visit
Admission: RE | Admit: 2019-08-12 | Discharge: 2019-08-12 | Disposition: A | Discharge status: Home / Self Care | Payer: Medicare Other | Source: Ambulatory Visit | Attending: Radiation Oncology | Admitting: Radiation Oncology

## 2019-08-12 ENCOUNTER — Other Ambulatory Visit: Payer: Self-pay

## 2019-08-12 DIAGNOSIS — Z510 Encounter for antineoplastic radiation therapy: Secondary | ICD-10-CM | POA: Diagnosis not present

## 2019-08-12 DIAGNOSIS — Z170 Estrogen receptor positive status [ER+]: Secondary | ICD-10-CM | POA: Diagnosis not present

## 2019-08-12 DIAGNOSIS — C50411 Malignant neoplasm of upper-outer quadrant of right female breast: Secondary | ICD-10-CM | POA: Diagnosis not present

## 2019-08-13 ENCOUNTER — Ambulatory Visit
Admission: RE | Admit: 2019-08-13 | Discharge: 2019-08-13 | Disposition: A | Discharge status: Home / Self Care | Payer: Medicare Other | Source: Ambulatory Visit | Attending: Radiation Oncology | Admitting: Radiation Oncology

## 2019-08-13 DIAGNOSIS — Z510 Encounter for antineoplastic radiation therapy: Secondary | ICD-10-CM | POA: Diagnosis not present

## 2019-08-13 DIAGNOSIS — Z170 Estrogen receptor positive status [ER+]: Secondary | ICD-10-CM | POA: Diagnosis not present

## 2019-08-13 DIAGNOSIS — C50411 Malignant neoplasm of upper-outer quadrant of right female breast: Secondary | ICD-10-CM | POA: Diagnosis not present

## 2019-08-14 ENCOUNTER — Other Ambulatory Visit: Payer: Self-pay

## 2019-08-14 ENCOUNTER — Ambulatory Visit
Admission: RE | Admit: 2019-08-14 | Discharge: 2019-08-14 | Disposition: A | Discharge status: Home / Self Care | Payer: Medicare Other | Source: Ambulatory Visit | Attending: Radiation Oncology | Admitting: Radiation Oncology

## 2019-08-14 DIAGNOSIS — Z510 Encounter for antineoplastic radiation therapy: Secondary | ICD-10-CM | POA: Diagnosis not present

## 2019-08-14 DIAGNOSIS — C50411 Malignant neoplasm of upper-outer quadrant of right female breast: Secondary | ICD-10-CM | POA: Diagnosis not present

## 2019-08-14 DIAGNOSIS — Z170 Estrogen receptor positive status [ER+]: Secondary | ICD-10-CM | POA: Diagnosis not present

## 2019-08-18 ENCOUNTER — Other Ambulatory Visit: Payer: Self-pay

## 2019-08-18 ENCOUNTER — Ambulatory Visit
Admission: RE | Admit: 2019-08-18 | Discharge: 2019-08-18 | Disposition: A | Discharge status: Home / Self Care | Payer: Medicare Other | Source: Ambulatory Visit | Attending: Radiation Oncology | Admitting: Radiation Oncology

## 2019-08-18 DIAGNOSIS — Z510 Encounter for antineoplastic radiation therapy: Secondary | ICD-10-CM | POA: Diagnosis not present

## 2019-08-18 DIAGNOSIS — Z170 Estrogen receptor positive status [ER+]: Secondary | ICD-10-CM | POA: Diagnosis not present

## 2019-08-18 DIAGNOSIS — C50411 Malignant neoplasm of upper-outer quadrant of right female breast: Secondary | ICD-10-CM | POA: Diagnosis not present

## 2019-08-19 ENCOUNTER — Other Ambulatory Visit: Payer: Self-pay

## 2019-08-19 ENCOUNTER — Ambulatory Visit
Admission: RE | Admit: 2019-08-19 | Discharge: 2019-08-19 | Disposition: A | Discharge status: Home / Self Care | Payer: Medicare Other | Source: Ambulatory Visit | Attending: Radiation Oncology | Admitting: Radiation Oncology

## 2019-08-19 DIAGNOSIS — Z170 Estrogen receptor positive status [ER+]: Secondary | ICD-10-CM | POA: Diagnosis not present

## 2019-08-19 DIAGNOSIS — C50411 Malignant neoplasm of upper-outer quadrant of right female breast: Secondary | ICD-10-CM | POA: Diagnosis not present

## 2019-08-19 DIAGNOSIS — Z510 Encounter for antineoplastic radiation therapy: Secondary | ICD-10-CM | POA: Diagnosis not present

## 2019-08-20 ENCOUNTER — Ambulatory Visit
Admission: RE | Admit: 2019-08-20 | Discharge: 2019-08-20 | Disposition: A | Discharge status: Home / Self Care | Payer: Medicare Other | Source: Ambulatory Visit | Attending: Radiation Oncology | Admitting: Radiation Oncology

## 2019-08-20 ENCOUNTER — Other Ambulatory Visit: Payer: Self-pay

## 2019-08-20 DIAGNOSIS — C50411 Malignant neoplasm of upper-outer quadrant of right female breast: Secondary | ICD-10-CM | POA: Diagnosis not present

## 2019-08-20 DIAGNOSIS — Z510 Encounter for antineoplastic radiation therapy: Secondary | ICD-10-CM | POA: Diagnosis not present

## 2019-08-20 DIAGNOSIS — Z170 Estrogen receptor positive status [ER+]: Secondary | ICD-10-CM | POA: Diagnosis not present

## 2019-08-21 ENCOUNTER — Encounter: Payer: Self-pay | Admitting: *Deleted

## 2019-08-21 ENCOUNTER — Ambulatory Visit
Admission: RE | Admit: 2019-08-21 | Discharge: 2019-08-21 | Disposition: A | Discharge status: Home / Self Care | Payer: Medicare Other | Source: Ambulatory Visit | Attending: Radiation Oncology | Admitting: Radiation Oncology

## 2019-08-21 ENCOUNTER — Other Ambulatory Visit: Payer: Self-pay

## 2019-08-21 DIAGNOSIS — C50411 Malignant neoplasm of upper-outer quadrant of right female breast: Secondary | ICD-10-CM | POA: Diagnosis not present

## 2019-08-21 DIAGNOSIS — Z510 Encounter for antineoplastic radiation therapy: Secondary | ICD-10-CM | POA: Diagnosis not present

## 2019-08-21 DIAGNOSIS — Z170 Estrogen receptor positive status [ER+]: Secondary | ICD-10-CM | POA: Diagnosis not present

## 2019-08-25 ENCOUNTER — Ambulatory Visit
Admission: RE | Admit: 2019-08-25 | Discharge: 2019-08-25 | Disposition: A | Discharge status: Home / Self Care | Payer: Medicare Other | Source: Ambulatory Visit | Attending: Radiation Oncology | Admitting: Radiation Oncology

## 2019-08-25 ENCOUNTER — Inpatient Hospital Stay: Payer: Medicare Other | Attending: Hematology

## 2019-08-25 ENCOUNTER — Other Ambulatory Visit: Payer: Self-pay

## 2019-08-25 DIAGNOSIS — Z79810 Long term (current) use of selective estrogen receptor modulators (SERMs): Secondary | ICD-10-CM | POA: Insufficient documentation

## 2019-08-25 DIAGNOSIS — Z923 Personal history of irradiation: Secondary | ICD-10-CM | POA: Diagnosis not present

## 2019-08-25 DIAGNOSIS — Z170 Estrogen receptor positive status [ER+]: Secondary | ICD-10-CM | POA: Insufficient documentation

## 2019-08-25 DIAGNOSIS — Z79899 Other long term (current) drug therapy: Secondary | ICD-10-CM | POA: Insufficient documentation

## 2019-08-25 DIAGNOSIS — R636 Underweight: Secondary | ICD-10-CM | POA: Diagnosis not present

## 2019-08-25 DIAGNOSIS — Z510 Encounter for antineoplastic radiation therapy: Secondary | ICD-10-CM | POA: Diagnosis not present

## 2019-08-25 DIAGNOSIS — E039 Hypothyroidism, unspecified: Secondary | ICD-10-CM | POA: Diagnosis not present

## 2019-08-25 DIAGNOSIS — J449 Chronic obstructive pulmonary disease, unspecified: Secondary | ICD-10-CM | POA: Diagnosis not present

## 2019-08-25 DIAGNOSIS — R42 Dizziness and giddiness: Secondary | ICD-10-CM | POA: Diagnosis not present

## 2019-08-25 DIAGNOSIS — F17210 Nicotine dependence, cigarettes, uncomplicated: Secondary | ICD-10-CM | POA: Diagnosis not present

## 2019-08-25 DIAGNOSIS — C50411 Malignant neoplasm of upper-outer quadrant of right female breast: Secondary | ICD-10-CM | POA: Diagnosis not present

## 2019-08-25 DIAGNOSIS — I5032 Chronic diastolic (congestive) heart failure: Secondary | ICD-10-CM | POA: Insufficient documentation

## 2019-08-25 DIAGNOSIS — C773 Secondary and unspecified malignant neoplasm of axilla and upper limb lymph nodes: Secondary | ICD-10-CM | POA: Insufficient documentation

## 2019-08-25 DIAGNOSIS — M818 Other osteoporosis without current pathological fracture: Secondary | ICD-10-CM | POA: Diagnosis not present

## 2019-08-25 LAB — CMP (CANCER CENTER ONLY)
ALT: 13 U/L (ref 0–44)
AST: 21 U/L (ref 15–41)
Albumin: 3.4 g/dL — ABNORMAL LOW (ref 3.5–5.0)
Alkaline Phosphatase: 43 U/L (ref 38–126)
Anion gap: 9 (ref 5–15)
BUN: 23 mg/dL (ref 8–23)
CO2: 28 mmol/L (ref 22–32)
Calcium: 9.3 mg/dL (ref 8.9–10.3)
Chloride: 103 mmol/L (ref 98–111)
Creatinine: 0.81 mg/dL (ref 0.44–1.00)
GFR, Est AFR Am: >60 mL/min (ref >60)
GFR, Estimated: >60 mL/min (ref >60)
Glucose, Bld: 78 mg/dL (ref 70–99)
Potassium: 4.1 mmol/L (ref 3.5–5.1)
Sodium: 140 mmol/L (ref 135–145)
Total Bilirubin: 0.5 mg/dL (ref 0.3–1.2)
Total Protein: 6.7 g/dL (ref 6.5–8.1)

## 2019-08-25 LAB — CBC WITH DIFFERENTIAL (CANCER CENTER ONLY)
Abs Immature Granulocytes: 0.02 10*3/uL (ref 0.00–0.07)
Basophils Absolute: 0.1 10*3/uL (ref 0.0–0.1)
Basophils Relative: 1 %
Eosinophils Absolute: 0.2 10*3/uL (ref 0.0–0.5)
Eosinophils Relative: 3 %
HCT: 37.7 % (ref 36.0–46.0)
Hemoglobin: 12.5 g/dL (ref 12.0–15.0)
Immature Granulocytes: 0 %
Lymphocytes Relative: 14 %
Lymphs Abs: 0.8 10*3/uL (ref 0.7–4.0)
MCH: 32 pg (ref 26.0–34.0)
MCHC: 33.2 g/dL (ref 30.0–36.0)
MCV: 96.4 fL (ref 80.0–100.0)
Monocytes Absolute: 0.9 10*3/uL (ref 0.1–1.0)
Monocytes Relative: 14 %
Neutro Abs: 4.1 10*3/uL (ref 1.7–7.7)
Neutrophils Relative %: 68 %
Platelet Count: 217 10*3/uL (ref 150–400)
RBC: 3.91 MIL/uL (ref 3.87–5.11)
RDW: 13.4 % (ref 11.5–15.5)
WBC Count: 6.1 10*3/uL (ref 4.0–10.5)
nRBC: 0 % (ref 0.0–0.2)

## 2019-08-25 MED ORDER — SONAFINE EX EMUL
1.0000 "application " | Freq: Once | CUTANEOUS | Status: AC
  Administered 2019-08-25: 1 via TOPICAL

## 2019-08-26 ENCOUNTER — Other Ambulatory Visit: Payer: Self-pay | Admitting: Cardiovascular Disease

## 2019-08-26 ENCOUNTER — Encounter: Payer: Self-pay | Admitting: *Deleted

## 2019-08-26 ENCOUNTER — Other Ambulatory Visit: Payer: Self-pay

## 2019-08-26 ENCOUNTER — Ambulatory Visit
Admission: RE | Admit: 2019-08-26 | Discharge: 2019-08-26 | Disposition: A | Discharge status: Home / Self Care | Payer: Medicare Other | Source: Ambulatory Visit | Attending: Radiation Oncology | Admitting: Radiation Oncology

## 2019-08-26 DIAGNOSIS — Z170 Estrogen receptor positive status [ER+]: Secondary | ICD-10-CM | POA: Diagnosis not present

## 2019-08-26 DIAGNOSIS — Z510 Encounter for antineoplastic radiation therapy: Secondary | ICD-10-CM | POA: Diagnosis not present

## 2019-08-26 DIAGNOSIS — C50411 Malignant neoplasm of upper-outer quadrant of right female breast: Secondary | ICD-10-CM | POA: Diagnosis not present

## 2019-08-27 ENCOUNTER — Ambulatory Visit
Admission: RE | Admit: 2019-08-27 | Discharge: 2019-08-27 | Disposition: A | Discharge status: Home / Self Care | Payer: Medicare Other | Source: Ambulatory Visit | Attending: Radiation Oncology | Admitting: Radiation Oncology

## 2019-08-27 ENCOUNTER — Other Ambulatory Visit: Payer: Self-pay

## 2019-08-27 DIAGNOSIS — Z510 Encounter for antineoplastic radiation therapy: Secondary | ICD-10-CM | POA: Diagnosis not present

## 2019-08-27 DIAGNOSIS — C50411 Malignant neoplasm of upper-outer quadrant of right female breast: Secondary | ICD-10-CM | POA: Diagnosis not present

## 2019-08-27 DIAGNOSIS — Z170 Estrogen receptor positive status [ER+]: Secondary | ICD-10-CM | POA: Diagnosis not present

## 2019-08-28 ENCOUNTER — Other Ambulatory Visit: Payer: Self-pay

## 2019-08-28 ENCOUNTER — Ambulatory Visit
Admission: RE | Admit: 2019-08-28 | Discharge: 2019-08-28 | Disposition: A | Discharge status: Home / Self Care | Payer: Medicare Other | Source: Ambulatory Visit | Attending: Radiation Oncology | Admitting: Radiation Oncology

## 2019-08-28 DIAGNOSIS — C50411 Malignant neoplasm of upper-outer quadrant of right female breast: Secondary | ICD-10-CM | POA: Diagnosis not present

## 2019-08-28 DIAGNOSIS — Z170 Estrogen receptor positive status [ER+]: Secondary | ICD-10-CM | POA: Diagnosis not present

## 2019-08-28 DIAGNOSIS — Z510 Encounter for antineoplastic radiation therapy: Secondary | ICD-10-CM | POA: Diagnosis not present

## 2019-08-29 ENCOUNTER — Ambulatory Visit
Admission: RE | Admit: 2019-08-29 | Discharge: 2019-08-29 | Disposition: A | Discharge status: Home / Self Care | Payer: Medicare Other | Source: Ambulatory Visit | Attending: Radiation Oncology | Admitting: Radiation Oncology

## 2019-08-29 ENCOUNTER — Other Ambulatory Visit: Payer: Self-pay

## 2019-08-29 DIAGNOSIS — C50411 Malignant neoplasm of upper-outer quadrant of right female breast: Secondary | ICD-10-CM | POA: Diagnosis not present

## 2019-08-29 DIAGNOSIS — Z510 Encounter for antineoplastic radiation therapy: Secondary | ICD-10-CM | POA: Diagnosis not present

## 2019-08-29 DIAGNOSIS — Z170 Estrogen receptor positive status [ER+]: Secondary | ICD-10-CM | POA: Diagnosis not present

## 2019-09-01 ENCOUNTER — Ambulatory Visit
Admission: RE | Admit: 2019-09-01 | Discharge: 2019-09-01 | Disposition: A | Discharge status: Home / Self Care | Payer: Medicare Other | Source: Ambulatory Visit | Attending: Radiation Oncology | Admitting: Radiation Oncology

## 2019-09-01 ENCOUNTER — Inpatient Hospital Stay: Payer: Medicare Other | Admitting: Hematology

## 2019-09-01 ENCOUNTER — Other Ambulatory Visit: Payer: Self-pay

## 2019-09-01 DIAGNOSIS — C50411 Malignant neoplasm of upper-outer quadrant of right female breast: Secondary | ICD-10-CM | POA: Diagnosis not present

## 2019-09-01 DIAGNOSIS — Z510 Encounter for antineoplastic radiation therapy: Secondary | ICD-10-CM | POA: Diagnosis not present

## 2019-09-01 DIAGNOSIS — Z170 Estrogen receptor positive status [ER+]: Secondary | ICD-10-CM | POA: Diagnosis not present

## 2019-09-02 ENCOUNTER — Other Ambulatory Visit: Payer: Self-pay

## 2019-09-02 ENCOUNTER — Ambulatory Visit
Admission: RE | Admit: 2019-09-02 | Discharge: 2019-09-02 | Disposition: A | Discharge status: Home / Self Care | Payer: Medicare Other | Source: Ambulatory Visit | Attending: Radiation Oncology | Admitting: Radiation Oncology

## 2019-09-02 DIAGNOSIS — Z510 Encounter for antineoplastic radiation therapy: Secondary | ICD-10-CM | POA: Diagnosis not present

## 2019-09-02 DIAGNOSIS — C50411 Malignant neoplasm of upper-outer quadrant of right female breast: Secondary | ICD-10-CM | POA: Diagnosis not present

## 2019-09-02 DIAGNOSIS — Z170 Estrogen receptor positive status [ER+]: Secondary | ICD-10-CM | POA: Diagnosis not present

## 2019-09-03 ENCOUNTER — Other Ambulatory Visit: Payer: Self-pay

## 2019-09-03 ENCOUNTER — Encounter: Payer: Self-pay | Admitting: Radiation Oncology

## 2019-09-03 ENCOUNTER — Telehealth: Payer: Self-pay | Admitting: Hematology

## 2019-09-03 ENCOUNTER — Ambulatory Visit
Admission: RE | Admit: 2019-09-03 | Discharge: 2019-09-03 | Disposition: A | Discharge status: Home / Self Care | Payer: Medicare Other | Source: Ambulatory Visit | Attending: Radiation Oncology | Admitting: Radiation Oncology

## 2019-09-03 ENCOUNTER — Encounter: Payer: Self-pay | Admitting: *Deleted

## 2019-09-03 DIAGNOSIS — C50411 Malignant neoplasm of upper-outer quadrant of right female breast: Secondary | ICD-10-CM

## 2019-09-03 DIAGNOSIS — Z170 Estrogen receptor positive status [ER+]: Secondary | ICD-10-CM | POA: Diagnosis not present

## 2019-09-03 DIAGNOSIS — Z510 Encounter for antineoplastic radiation therapy: Secondary | ICD-10-CM | POA: Diagnosis not present

## 2019-09-03 MED ORDER — SONAFINE EX EMUL
1.0000 "application " | Freq: Once | CUTANEOUS | Status: AC
  Administered 2019-09-03: 1 via TOPICAL

## 2019-09-03 NOTE — Telephone Encounter (Signed)
Scheduled per 1/13 sch msg. Called and spoke with Josph Macho (son), confirmed 1/20 appt

## 2019-09-08 ENCOUNTER — Telehealth: Payer: Self-pay | Admitting: *Deleted

## 2019-09-08 NOTE — Telephone Encounter (Signed)
Received call from patient's son Josph Macho to have appointment changed.  New appointment confirmed for 09/11/19 for 1015 labs then Dr. Burr Medico at 1040.

## 2019-09-08 NOTE — Progress Notes (Signed)
[place]   [phone] [phone]) [phone]   Clinic Follow up Note   Patient Care Team: [name], MD as PCP - General Skeet Latch, MD as PCP - Cardiology (Cardiology) Rockwell Germany, RN as Oncology Nurse Navigator Mauro Kaufmann, RN as Oncology Nurse Navigator Jovita Kussmaul, MD as Consulting Physician (General Surgery) [name], MD as Consulting Physician (Hematology) Eppie Gibson, MD as Attending Physician (Radiation Oncology)  Date of Service:  09/11/2019  CHIEF COMPLAINT: F/u of right breast cancer  SUMMARY OF ONCOLOGIC HISTORY: Oncology History Overview Note  Cancer Staging Malignant neoplasm of upper-outer quadrant of right breast in female, estrogen receptor positive ([place]) Staging form: Breast, AJCC 8th Edition - Clinical: Stage IIA (cT1c, cN1, cM0, G3, ER+, PR+, HER2-) - Signed by [name], MD on 04/09/2019 - Pathologic stage from 06/19/2019: Stage IB (pT1c, pN2a, cM0, G2, ER+, PR+, HER2-) - Signed by [name], MD on 07/03/2019    Malignant neoplasm of upper-outer quadrant of right breast in female, estrogen receptor positive ([place])  03/19/2019 Mammogram   Diagnostic Mammogram 03/19/19  IMPRESSION The 2 cm distortion in the  right breast 9:30 position middle depth 3cm from the nipple is indeterminate. [place] recommended.  There is also a 1.2cmx3.2cm enlarged right axillary LN highly suggestive of malignancy.     03/31/2019 Initial Biopsy   Diagnosis 03/31/19  1. Breast, right, needle core biopsy, 9:30 o'clock, mid depth, 3cmfn - INVASIVE DUCTAL CARCINOMA. - LYMPHOVASCULAR INVASION IS IDENTIFIED. - SEE COMMENT. 2. Lymph node, needle/core biopsy, right axilla - INVASIVE DUCTAL CARCINOMA. - SEE COMMENT.   03/31/2019 Receptors her2   1. PROGNOSTIC INDICATORS Results: IMMUNOHISTOCHEMICAL AND MORPHOMETRIC ANALYSIS PERFORMED MANUALLY The tumor cells are NEGATIVE for Her2 (0). Estrogen Receptor: 100%, POSITIVE, STRONG STAINING  INTENSITY Progesterone Receptor: 10%, POSITIVE, STRONG STAINING INTENSITY Proliferation Marker Ki67: 10%  2. PROGNOSTIC INDICATORS Results: IMMUNOHISTOCHEMICAL AND MORPHOMETRIC ANALYSIS PERFORMED MANUALLY Estrogen Receptor: 100%, POSITIVE, STRONG STAINING INTENSITY Progesterone Receptor: 0%, NEGATIVE Proliferation Marker Ki67: 40%   04/03/2019 Initial Diagnosis   Malignant neoplasm of upper-outer quadrant of right breast in female, estrogen receptor positive ([place])   04/09/2019 Cancer Staging   Staging form: Breast, AJCC 8th Edition - Clinical: Stage IIA (cT1c, cN1, cM0, G3, ER+, PR+, HER2-) - Signed by [name], MD on 04/09/2019  Staging form: Breast, AJCC 8th Edition - Clinical: Stage IIA (cT1c, cN1, cM0, G3, ER+, PR+, HER2-) - Signed by [name], MD on 04/09/2019   03/2019 -  Anti-estrogen oral therapy   Tamoxifen 51m daily starting 03/2019. Will hold starting 05/30/19 for surgery. Restarted in 07/2019   06/19/2019 Surgery   RIGHT BREAST LUMPECTOMY WITH RADIOACTIVE SEED AND SENTINEL LYMPH NODE BIOPSY by Dr [name] 06/19/19    06/19/2019 Pathology Results   FINAL MICROSCOPIC DIAGNOSIS: 06/19/19  A. BREAST, RIGHT, LUMPECTOMY:  -  Mucinous carcinoma, Nottingham grade 2  -  Margins uninvolved by carcinoma (0.2 cm; medial margin)  -  Lymphovascular space invasion present  -  Previous biopsy site changes present  -  See oncology table and comment below   B. LYMPH NODE, RIGHT AXILLARY #1, SENTINEL, BIOPSY:  -  Metastatic carcinoma involving two lymph nodes (2/2)  -  Extracapsular extension present  -  See comment   C. BREAST, RIGHT MEDIAL MARGIN, EXCISION:  -  Residual invasive carcinoma  -  Usual ductal hyperplasia with calcifications  -  See comment   D. LYMPH NODE, RIGHT AXILLARY #2, SENTINEL, BIOPSY:  -  Metastatic carcinoma involving one lymph node (1/1)   E. LYMPH NODE, RIGHT AXILLARY #1 , BIOPSY:  -  Metastatic carcinoma involving one lymph node (1/1)   F. LYMPH  NODE, RIGHT AXILLARY #2, BIOPSY:  -  No carcinoma identified in one lymph node (0/1)    06/19/2019 Cancer Staging   Staging form: Breast, AJCC 8th Edition - Pathologic stage from 06/19/2019: Stage IB (pT1c, pN2a, cM0, G2, ER+, PR+, HER2-) - Signed by [name], MD on 07/03/2019   07/22/2019 PET scan   IMPRESSION: 1. Low level hypermetabolism identified in the surgical beds of the right breast and right axilla. This is presumably related to the surgery. 2. Tiny right subpectoral lymph nodes are asymmetric in concerning for metastatic disease on CT imaging. No hypermetabolism on the PET study although size of these lymph nodes is below reliable resolution on PET imaging. 3. 10 mm short axis precarinal lymph node shows low level hypermetabolism. Metastatic involvement not excluded. 4. 11 mm ground-glass nodule in right upper lobe shows low level FDG accumulation. Well differentiated or low-grade neoplasm is a distinct concern. Close follow-up recommended. 5. Focal hypermetabolism in the right colon, potentially related to the ileocecal valve but incompletely characterized. Adenoma or carcinoma could have this appearance. Correlation with colorectal cancer screening history recommended. 6. Focal hypermetabolism identified in the region of the left pubic bone and adjacent sigmoid colon. No underlying bony or colonic lesion evident. Metastatic involvement of the left pubic bone not excluded. 7.  Aortic Atherosclerois (ICD10-170.0)     08/05/2019 - 09/03/2019 Radiation Therapy   Adjuvant RT with Dr Isidore Moos 08/05/19-09/03/19.       CURRENT THERAPY:  Tamoxifen started before surgery in 03/2019. She stopped right before surgery and restarted in 07/2019.  INTERVAL HISTORY:  Marissa Caldwell is here for a follow up. She presents to the clinic with her son. She notes her RT went well. She notes her skin is healing. She has been using cream from [place]. Her son notes she was fatigued and  since stopping RT energy has improved. She notes last few days she had constipation. She has tried Metamucil. She notes her last colonoscopy was over 10 years ago. She notes she is not drinking enough and has been dizzy lately.  She notes she has been smoking for several years. She notes she is tolerating Tamoxifen well with no issues.     REVIEW OF SYSTEMS:   Constitutional: Denies fevers, chills or abnormal weight loss (+) Improved fatigue (+) Dizziness Eyes: Denies blurriness of vision Ears, nose, mouth, throat, and face: Denies mucositis or sore throat Respiratory: Denies cough, dyspnea or wheezes Cardiovascular: Denies palpitation, chest discomfort or lower extremity swelling Gastrointestinal:  Denies nausea, heartburn (+) Mild constipation Skin: Denies abnormal skin rashes Lymphatics: Denies new lymphadenopathy or easy bruising Neurological:Denies numbness, tingling or new weaknesses Behavioral/Psych: Mood is stable, no new changes  All other systems were reviewed with the patient and are negative.  MEDICAL HISTORY:  Past Medical History:  Diagnosis Date  . Anxiety   . Arthritis   . Breast cancer ([place])   . CAD (coronary artery disease)    a. 03/2017: 95% LCx stenosis --> PCI/DES placed  . Cataract   . Chest pain 03/27/2017  . Chronic diastolic heart failure ([place]) 06/25/2017  . Chronic lower back pain   . COPD (chronic obstructive pulmonary disease) ([place])   . Coronary artery disease   . Diverticulitis   . Diverticulosis   . GERD (gastroesophageal reflux  disease)   . Glaucoma   . Heart failure, type unknown ([place]) 03/11/2017  . Hyperlipidemia 03/11/2017  . Hypertension   . Hypothyroidism   . Macular degeneration    wet and dry per pt's son  . Pneumonia X 1   "years and years ago" (03/26/2017)  . Shortness of breath 03/11/2017  . Status post dilation of esophageal narrowing     SURGICAL HISTORY: Past Surgical History:  Procedure Laterality Date  . BILATERAL OOPHORECTOMY  Bilateral   . BREAST LUMPECTOMY WITH RADIOACTIVE SEED AND SENTINEL LYMPH NODE BIOPSY Right 06/19/2019   Procedure: RIGHT BREAST LUMPECTOMY WITH RADIOACTIVE SEED AND SENTINEL LYMPH NODE BIOPSY;  Surgeon: Jovita Kussmaul, MD;  Location: [facility];  Service: General;  Laterality: Right;  . CATARACT EXTRACTION    . CATARACT EXTRACTION W/ INTRAOCULAR LENS  IMPLANT, BILATERAL Bilateral   . CORONARY ANGIOPLASTY WITH STENT PLACEMENT  03/26/2017  . LEFT HEART CATH AND CORONARY ANGIOGRAPHY N/A 03/26/2017   Procedure: Left Heart Cath and Coronary Angiography;  Surgeon: Belva Crome, MD;  Location: [facility];  Service: Cardiovascular;  Laterality: N/A;  . NERVE SURGERY     "lump on my sciatic nerve was removed years ago"  . SHOULDER OPEN ROTATOR CUFF REPAIR Right   . THYROIDECTOMY, PARTIAL      I have reviewed the social history and family history with the patient and they are unchanged from previous note.  ALLERGIES:  is allergic to lipitor [atorvastatin].  MEDICATIONS:  Current Outpatient Medications  Medication Sig Dispense Refill  . albuterol (PROVENTIL HFA;VENTOLIN HFA) 108 (90 Base) MCG/ACT inhaler Inhale 2 puffs into the lungs every 6 (six) hours as needed (for shortness of breath/wheezing.).     Marland Kitchen aspirin EC 81 MG tablet Take 81 mg by mouth daily.    . brimonidine (ALPHAGAN) 0.2 % ophthalmic solution Place 1 drop into the left eye 2 (two) times daily.     . budesonide-formoterol (SYMBICORT) 160-4.5 MCG/ACT inhaler Inhale 2 puffs into the lungs 2 (two) times daily.    . Ca Carbonate-Mag Hydroxide (ROLAIDS PO) Take 1-2 tablets by mouth daily as needed (acid reflux).    . Calcium Carb-Cholecalciferol (CALCIUM + D3) 600-200 MG-UNIT TABS Take 1 tablet by mouth daily.    . carvedilol (COREG) 3.125 MG tablet TAKE 1 TABLET BY MOUTH TWICE DAILY (Patient taking differently: Take 3.125 mg by mouth See admin instructions. Take 3.125 in the morning and may take a second 3.125 mg dose in the evening as  needed for SBP over 125) 180 tablet 3  . Cholecalciferol (VITAMIN D3) 2000 units capsule Take 4,000 Units by mouth daily.    . Coenzyme Q10 (COQ10) 100 MG CAPS Take 100 mg by mouth daily.    Marland Kitchen HYDROcodone-acetaminophen (NORCO/VICODIN) 5-325 MG tablet Take 1-2 tablets by mouth every 6 (six) hours as needed for moderate pain or severe pain. 10 tablet 0  . latanoprost (XALATAN) 0.005 % ophthalmic solution Place 1 drop into both eyes at bedtime.    Marland Kitchen levothyroxine (SYNTHROID, LEVOTHROID) 100 MCG tablet Take 100 mcg by mouth daily before breakfast.   0  . LORazepam (ATIVAN) 0.5 MG tablet Take 0.5 mg by mouth daily at 2 PM.     . LUTEIN PO Take 1 tablet by mouth daily.    . mirtazapine (REMERON) 15 MG tablet Take 15 mg by mouth at bedtime.    . Multiple Vitamin (MULTIVITAMIN) tablet Take 1 tablet by mouth daily.    . nitroGLYCERIN (  NITROSTAT) 0.4 MG SL tablet Place 1 tablet (0.4 mg total) under the tongue every 5 (five) minutes as needed for chest pain. 25 tablet 3  . Omega-3 Fatty Acids (FISH OIL PO) Take 1 capsule by mouth daily.    . pravastatin (PRAVACHOL) 40 MG tablet TAKE 1 TABLET BY MOUTH DAILY GENERIC EQUIVALENT FOR PRAVACHOL 90 tablet 1  . tamoxifen (NOLVADEX) 20 MG tablet Take 1 tablet (20 mg total) by mouth daily. 90 tablet 3  . TOVIAZ 4 MG TB24 tablet Take 4 mg by mouth daily.   1   Current Facility-Administered Medications  Medication Dose Route Frequency Provider Last Rate Last Admin  . aflibercept (EYLEA) SOLN 2 mg  2 mg Intravitreal  Bernarda Caffey, MD   2 mg at 04/16/18 1424  . aflibercept (EYLEA) SOLN 2 mg  2 mg Intravitreal  Bernarda Caffey, MD   2 mg at 05/14/18 1358  . aflibercept (EYLEA) SOLN 2 mg  2 mg Intravitreal  Bernarda Caffey, MD   2 mg at 06/25/18 1433  . aflibercept (EYLEA) SOLN 2 mg  2 mg Intravitreal  Bernarda Caffey, MD   2 mg at 08/07/18 1557  . aflibercept (EYLEA) SOLN 2 mg  2 mg Intravitreal  Bernarda Caffey, MD   2 mg at 10/11/18 1522  . Bevacizumab (AVASTIN) SOLN 1.25  mg  1.25 mg Intravitreal  Bernarda Caffey, MD   1.25 mg at 01/07/18 1429  . Bevacizumab (AVASTIN) SOLN 1.25 mg  1.25 mg Intravitreal  Bernarda Caffey, MD   1.25 mg at 02/19/18 1356  . Bevacizumab (AVASTIN) SOLN 1.25 mg  1.25 mg Intravitreal  Bernarda Caffey, MD   1.25 mg at 03/19/18 1347    PHYSICAL EXAMINATION: ECOG PERFORMANCE STATUS: 2 - Symptomatic, <50% confined to bed  Vitals:   09/11/19 1102  BP: (!) 107/56  Pulse: 73  Resp: 20  Temp: 98 F (36.7 C)  SpO2: 98%   Filed Weights   09/11/19 1102  Weight: 147 lb (66.7 kg)    GENERAL:alert, no distress and comfortable SKIN: skin color, texture, turgor are normal, no rashes or significant lesions EYES: normal, Conjunctiva are pink and non-injected, sclera clear  NECK: supple, thyroid normal size, non-tender, without nodularity LYMPH:  no palpable lymphadenopathy in the cervical, axillary  LUNGS: clear to auscultation and percussion with normal breathing effort HEART: regular rate & rhythm and no murmurs and no lower extremity edema ABDOMEN:abdomen soft, non-tender and normal bowel sounds Musculoskeletal:no cyanosis of digits and no clubbing  NEURO: alert & oriented x 3 with fluent speech, no focal motor/sensory deficits BREAST: S/p right lumpectomy: Surgical incision healed well (+) Diffuse Skin erythema of right upper back, right breast and axilla, no skin peeling or breakdown. No palpable mass, nodules or adenopathy bilaterally. Breast exam benign.   LABORATORY DATA:  I have reviewed the data as listed CBC Latest Ref Rng & Units 09/11/2019 08/25/2019 07/03/2019  WBC 4.0 - 10.5 K/uL 5.2 6.1 6.6  Hemoglobin 12.0 - 15.0 g/dL 12.3 12.5 12.9  Hematocrit 36.0 - 46.0 % 36.6 37.7 38.2  Platelets 150 - 400 K/uL 167 217 248     CMP Latest Ref Rng & Units 09/11/2019 08/25/2019 07/03/2019  Glucose 70 - 99 mg/dL 150(H) 78 84  BUN 8 - 23 mg/dL 28(H) 23 19  Creatinine 0.44 - 1.00 mg/dL 0.84 0.81 0.74  Sodium 135 - 145 mmol/L 141 140 140    Potassium 3.5 - 5.1 mmol/L 4.1 4.1 4.1  Chloride 98 - 111 mmol/L  107 103 108  CO2 22 - 32 mmol/L 28 28 25   Calcium 8.9 - 10.3 mg/dL 9.0 9.3 9.3  Total Protein 6.5 - 8.1 g/dL 6.7 6.7 7.0  Total Bilirubin 0.3 - 1.2 mg/dL 0.3 0.5 0.3  Alkaline Phos 38 - 126 U/L 48 43 53  AST 15 - 41 U/L 20 21 22   ALT 0 - 44 U/L 11 13 12       RADIOGRAPHIC STUDIES: I have personally reviewed the radiological images as listed and agreed with the findings in the report. No results found.   ASSESSMENT & PLAN:  Marissa Caldwell is a 86 y.o. female with    1.Malignant neoplasm of upper-outer quadrant of right breast,invasive ductal carcinoma,pT1cN2aMx, ER/PR+, HER2-,Grade II -She wasdiagnosedin 03/2019. She has invasiveductalcarcinoma which has spreadto herlocalLN.  -She underwent right breast lumpectomy with targeted lymph node dissection by Dr Marlou Starks on 06/19/19. Her pathology showed her tumor was completely removed, she had clear margins but 4 positive lymph nodes.  -To reduce her risk of local cancer recurrence she underwent adjuvant RT with Dr. Isidore Moos 08/05/19-09/03/19. She overall tolerated well. -She started Tamoxifen before surgery in 03/2019. She stopped right before surgery and restarted in 07/2019. Tolerating very well, will continue for 5-10 years.  -We reviewed her PET images from 07/22/19 in person and discussed several abnormal findings. It shows low level uptake in right breast surgical site which is possibly related to surgery.  She also has mildly hypermetabolic subpectoral and pericardial lymph node, suspicious for nodal metastasis.  She has uptake in her right colon, colonoscopy is recommended for further evaluation. She is agreeable. She also has 39m ground-glass nodules in RU lung, f/u scan recommended.  -Will repeat PET in 3 months with F/u  -I encouraged her to proceed with [facility]accination. She is interested.    2. CAD, HTN, Thyroid dysfunction -She has had a cardiac stent  placed in the past  -She is on Coreg, nitro, pravastatin and other meds for her heartand thyroid medication -She will continue to be closely followed by her cardiologist. -Recent Dizziness, likely due to dehydration drop in BP. I recommend she increase water intake and salt in diet.    3. COPD, Long Term smoking, Ground-glass inflammation -On inhalers, not on home oxygen -She still smokes occasionally. She has not completely quit.  -PET from 12/1/120 shows 130mground-glass nodules in RU lung, f/u recommended in 2-3 months  4. Underweight -After drastic change in diet she had lowered appetite and lost significant weight over a year. She has started to gain some of that weight back.  -She is still able to be independent and take care of herself and her home. She lives alone with good support and help from her son who stays 45 minutes away from her. -She is on Mirtazapine for appetite and sleep. I strongly encouraged her to take more nutritional supplements.  -Her weight has been trending up.    5. Osteoporosis, Arthritis  -She receives Proliainjectionsq6m36monthith Dr. WhiDema Severinas recently been denied by insurance. If not approved I discussed option of Orla Fosamax, she can discuss this with Dr. WhiDema Severin-She had fracture of her wristsin 2019 -Her joint pain is mainly in her left hand and lower back when she stands for a long time.Stable    6. Macular Degeneration of both eyes, hearingloss -Managed by her Ophthalmologist   7.Anxiety/depression  -OnZoloft, mood stable -We discussed thatzoloft has very mild interaction with Tamoxifen, will monitor for now  PLAN: -Refer to Dr Collene Mares for Colonoscopy in the next month due to her abnormal PET scan findings  -f/u in 3 months with lab and PET scan a few days before.    No problem-specific Assessment & Plan notes found for this encounter.   Orders Placed This Encounter  Procedures  . NM PET Image Restag  (PS) Skull Base To Thigh    Standing Status:   Future    Standing Expiration Date:   09/10/2020    Order Specific Question:   If indicated for the ordered procedure, I authorize the administration of a radiopharmaceutical per Radiology protocol    Answer:   Yes    Order Specific Question:   Radiology Contrast Protocol - do NOT remove file path    Answer:   [email or web address]   All questions were answered. The patient knows to call the clinic with any problems, questions or concerns. No barriers to learning was detected. The total time spent in the appointment was 30 minutes.     [name], MD 09/11/2019   I, Joslyn Devon, am acting as scribe for [name], MD.   I have reviewed the above documentation for accuracy and completeness, and I agree with the above.

## 2019-09-10 ENCOUNTER — Ambulatory Visit: Payer: Medicare Other | Admitting: Hematology

## 2019-09-10 ENCOUNTER — Other Ambulatory Visit: Payer: Medicare Other

## 2019-09-11 ENCOUNTER — Inpatient Hospital Stay: Payer: Medicare Other | Admitting: Hematology

## 2019-09-11 ENCOUNTER — Other Ambulatory Visit: Payer: Self-pay

## 2019-09-11 ENCOUNTER — Encounter: Payer: Self-pay | Admitting: Hematology

## 2019-09-11 ENCOUNTER — Inpatient Hospital Stay: Payer: Medicare Other

## 2019-09-11 VITALS — BP 107/56 | HR 73 | Temp 98.0°F | Resp 20 | Ht 72.0 in | Wt 147.0 lb

## 2019-09-11 DIAGNOSIS — C50411 Malignant neoplasm of upper-outer quadrant of right female breast: Secondary | ICD-10-CM

## 2019-09-11 DIAGNOSIS — E039 Hypothyroidism, unspecified: Secondary | ICD-10-CM | POA: Diagnosis not present

## 2019-09-11 DIAGNOSIS — R42 Dizziness and giddiness: Secondary | ICD-10-CM | POA: Diagnosis not present

## 2019-09-11 DIAGNOSIS — F17210 Nicotine dependence, cigarettes, uncomplicated: Secondary | ICD-10-CM | POA: Diagnosis not present

## 2019-09-11 DIAGNOSIS — Z170 Estrogen receptor positive status [ER+]: Secondary | ICD-10-CM | POA: Diagnosis not present

## 2019-09-11 DIAGNOSIS — Z79899 Other long term (current) drug therapy: Secondary | ICD-10-CM | POA: Diagnosis not present

## 2019-09-11 DIAGNOSIS — R636 Underweight: Secondary | ICD-10-CM | POA: Diagnosis not present

## 2019-09-11 DIAGNOSIS — C773 Secondary and unspecified malignant neoplasm of axilla and upper limb lymph nodes: Secondary | ICD-10-CM | POA: Diagnosis not present

## 2019-09-11 DIAGNOSIS — J449 Chronic obstructive pulmonary disease, unspecified: Secondary | ICD-10-CM | POA: Diagnosis not present

## 2019-09-11 DIAGNOSIS — I5032 Chronic diastolic (congestive) heart failure: Secondary | ICD-10-CM | POA: Diagnosis not present

## 2019-09-11 DIAGNOSIS — M818 Other osteoporosis without current pathological fracture: Secondary | ICD-10-CM | POA: Diagnosis not present

## 2019-09-11 DIAGNOSIS — Z79810 Long term (current) use of selective estrogen receptor modulators (SERMs): Secondary | ICD-10-CM | POA: Diagnosis not present

## 2019-09-11 DIAGNOSIS — Z923 Personal history of irradiation: Secondary | ICD-10-CM | POA: Diagnosis not present

## 2019-09-11 LAB — CBC WITH DIFFERENTIAL (CANCER CENTER ONLY)
Abs Immature Granulocytes: 0.02 10*3/uL (ref 0.00–0.07)
Basophils Absolute: 0 10*3/uL (ref 0.0–0.1)
Basophils Relative: 1 %
Eosinophils Absolute: 0.1 10*3/uL (ref 0.0–0.5)
Eosinophils Relative: 3 %
HCT: 36.6 % (ref 36.0–46.0)
Hemoglobin: 12.3 g/dL (ref 12.0–15.0)
Immature Granulocytes: 0 %
Lymphocytes Relative: 15 %
Lymphs Abs: 0.8 10*3/uL (ref 0.7–4.0)
MCH: 32.5 pg (ref 26.0–34.0)
MCHC: 33.6 g/dL (ref 30.0–36.0)
MCV: 96.8 fL (ref 80.0–100.0)
Monocytes Absolute: 0.6 10*3/uL (ref 0.1–1.0)
Monocytes Relative: 11 %
Neutro Abs: 3.6 10*3/uL (ref 1.7–7.7)
Neutrophils Relative %: 70 %
Platelet Count: 167 10*3/uL (ref 150–400)
RBC: 3.78 MIL/uL — ABNORMAL LOW (ref 3.87–5.11)
RDW: 13.5 % (ref 11.5–15.5)
WBC Count: 5.2 10*3/uL (ref 4.0–10.5)
nRBC: 0 % (ref 0.0–0.2)

## 2019-09-11 LAB — CMP (CANCER CENTER ONLY)
ALT: 11 U/L (ref 0–44)
AST: 20 U/L (ref 15–41)
Albumin: 3.2 g/dL — ABNORMAL LOW (ref 3.5–5.0)
Alkaline Phosphatase: 48 U/L (ref 38–126)
Anion gap: 6 (ref 5–15)
BUN: 28 mg/dL — ABNORMAL HIGH (ref 8–23)
CO2: 28 mmol/L (ref 22–32)
Calcium: 9 mg/dL (ref 8.9–10.3)
Chloride: 107 mmol/L (ref 98–111)
Creatinine: 0.84 mg/dL (ref 0.44–1.00)
GFR, Est AFR Am: >60 mL/min (ref >60)
GFR, Estimated: >60 mL/min (ref >60)
Glucose, Bld: 150 mg/dL — ABNORMAL HIGH (ref 70–99)
Potassium: 4.1 mmol/L (ref 3.5–5.1)
Sodium: 141 mmol/L (ref 135–145)
Total Bilirubin: 0.3 mg/dL (ref 0.3–1.2)
Total Protein: 6.7 g/dL (ref 6.5–8.1)

## 2019-09-11 MED ORDER — TAMOXIFEN CITRATE 20 MG PO TABS: 20.0000 mg | ORAL_TABLET | Freq: Every day | ORAL | 3 refills | Status: DC

## 2019-09-11 NOTE — Progress Notes (Signed)
Submitted patient information for wait list for Covid-19 vaccine.

## 2019-09-12 ENCOUNTER — Telehealth: Payer: Self-pay

## 2019-09-12 ENCOUNTER — Telehealth: Payer: Self-pay | Admitting: Hematology

## 2019-09-12 NOTE — Telephone Encounter (Signed)
Faxed referral to Dr. Collene Mares at fax [phone] included OV note from 1/21, labs, PET scan result and demographics.

## 2019-09-12 NOTE — Telephone Encounter (Signed)
Scheduled appt per 1/21 los.  Sent a message to HIM pool to get a calendar mailed out. 

## 2019-09-16 ENCOUNTER — Telehealth: Payer: Self-pay | Admitting: *Deleted

## 2019-09-16 NOTE — Telephone Encounter (Signed)
Marissa Caldwell will have her son call to  schedule her appointment.

## 2019-09-17 ENCOUNTER — Other Ambulatory Visit: Payer: Self-pay

## 2019-09-17 ENCOUNTER — Telehealth: Payer: Self-pay

## 2019-09-17 DIAGNOSIS — C50411 Malignant neoplasm of upper-outer quadrant of right female breast: Secondary | ICD-10-CM

## 2019-09-17 DIAGNOSIS — Z170 Estrogen receptor positive status [ER+]: Secondary | ICD-10-CM

## 2019-09-17 IMAGING — CR DG WRIST COMPLETE 3+V*L*
4 series · 4 of 4 positions shown · non-contrast
Comparison: None.

CLINICAL DATA: Left wrist pain after fall today.

EXAM:
LEFT WRIST - COMPLETE 3+ VIEW

[x wrist pa left]
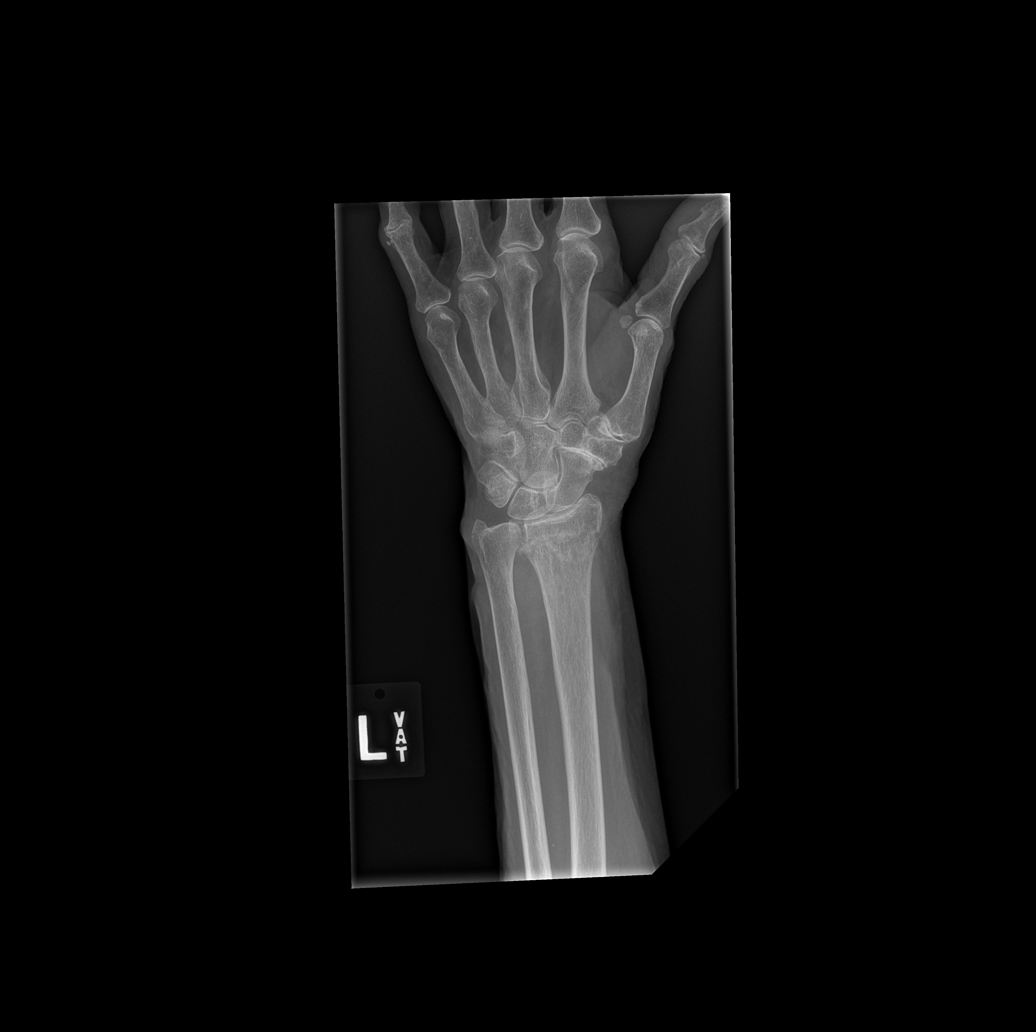

[x wrist obl left]
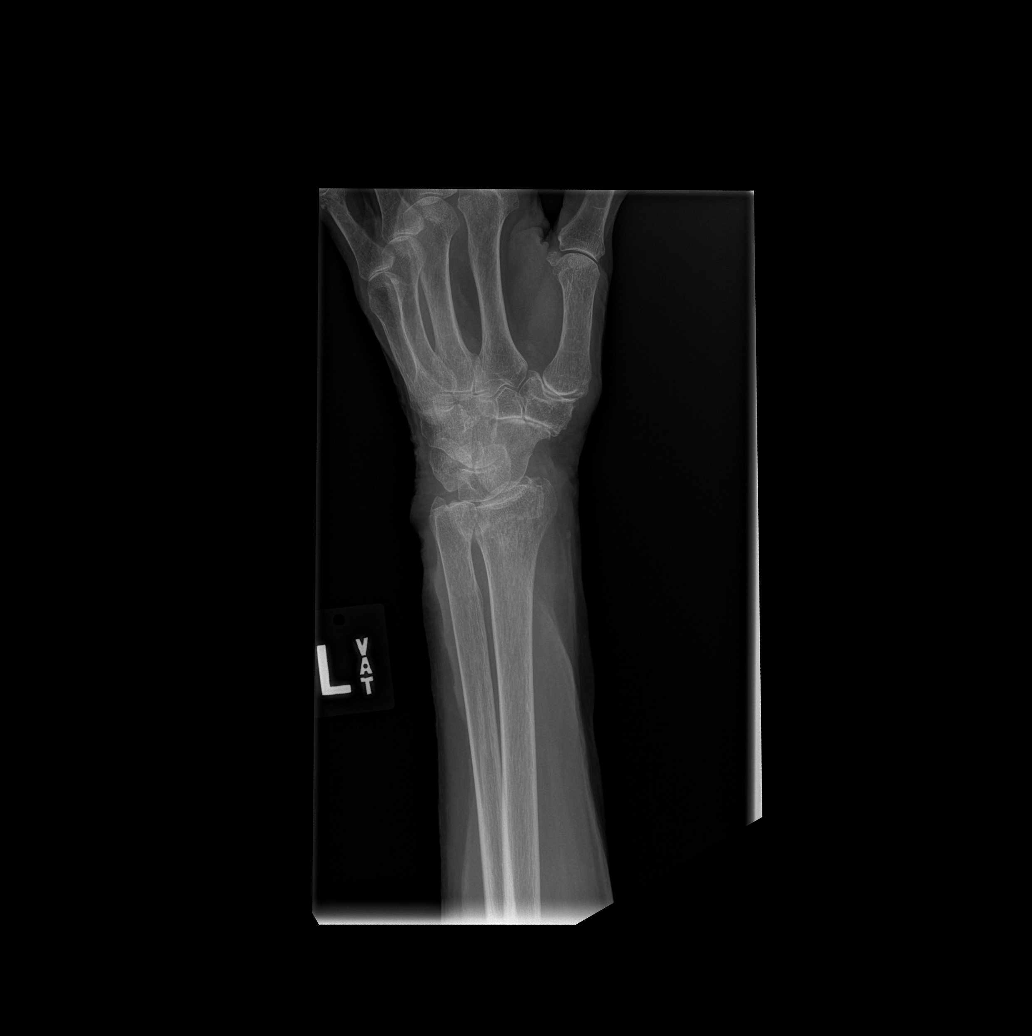

[x wrist lat left]
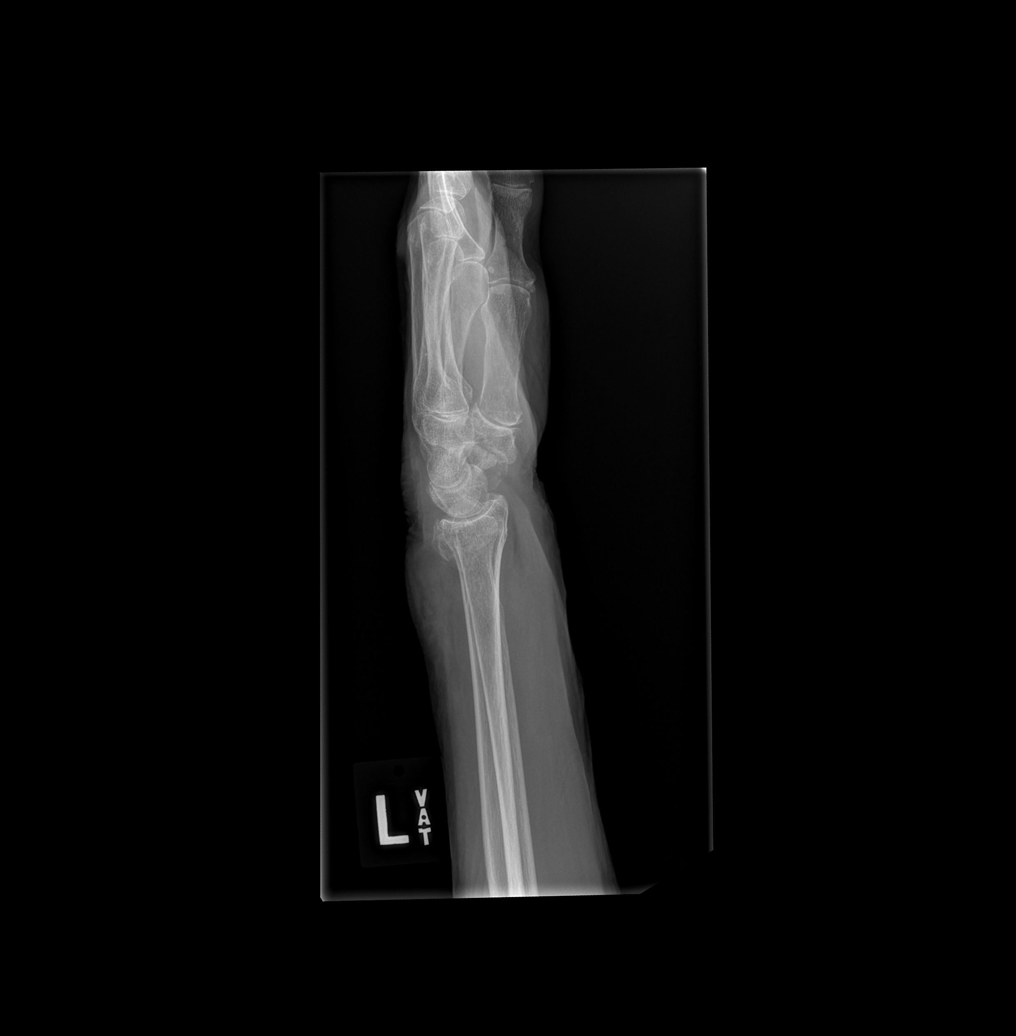

[x wrist navicular view left]
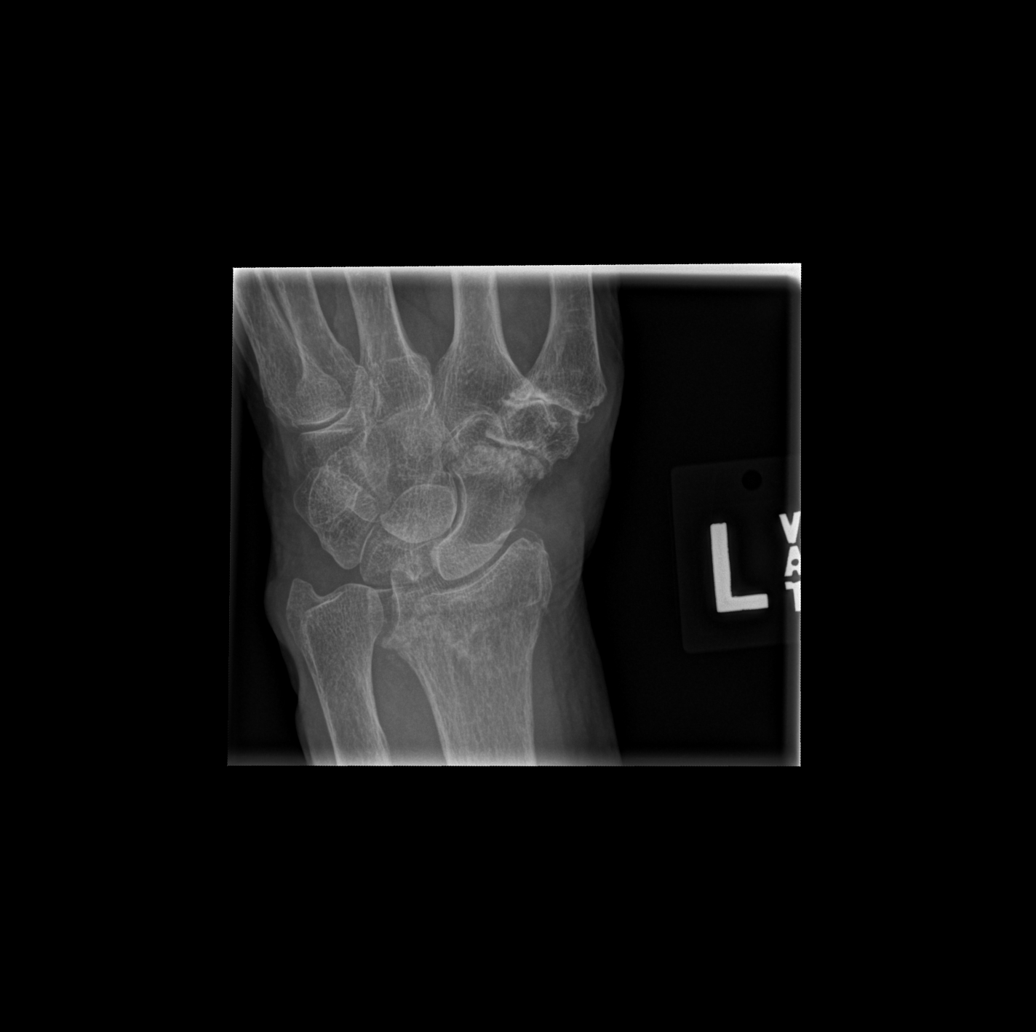

[4 of 4 positions shown; findings below may reference images not displayed]

FINDINGS: Moderately displaced fracture is seen involving the distal left
radius. No other fracture is noted. Intercarpal degenerative joint
disease is noted. Degenerative change involving first
carpometacarpal joint is noted.
IMPRESSION: Moderately displaced distal left radial fracture.

## 2019-09-17 IMAGING — CR DG WRIST COMPLETE 3+V*R*
4 series · 4 of 4 positions shown · non-contrast
Comparison: None.

CLINICAL DATA: Right wrist pain after fall today.

EXAM:
RIGHT WRIST - COMPLETE 3+ VIEW

[x wrist pa right]
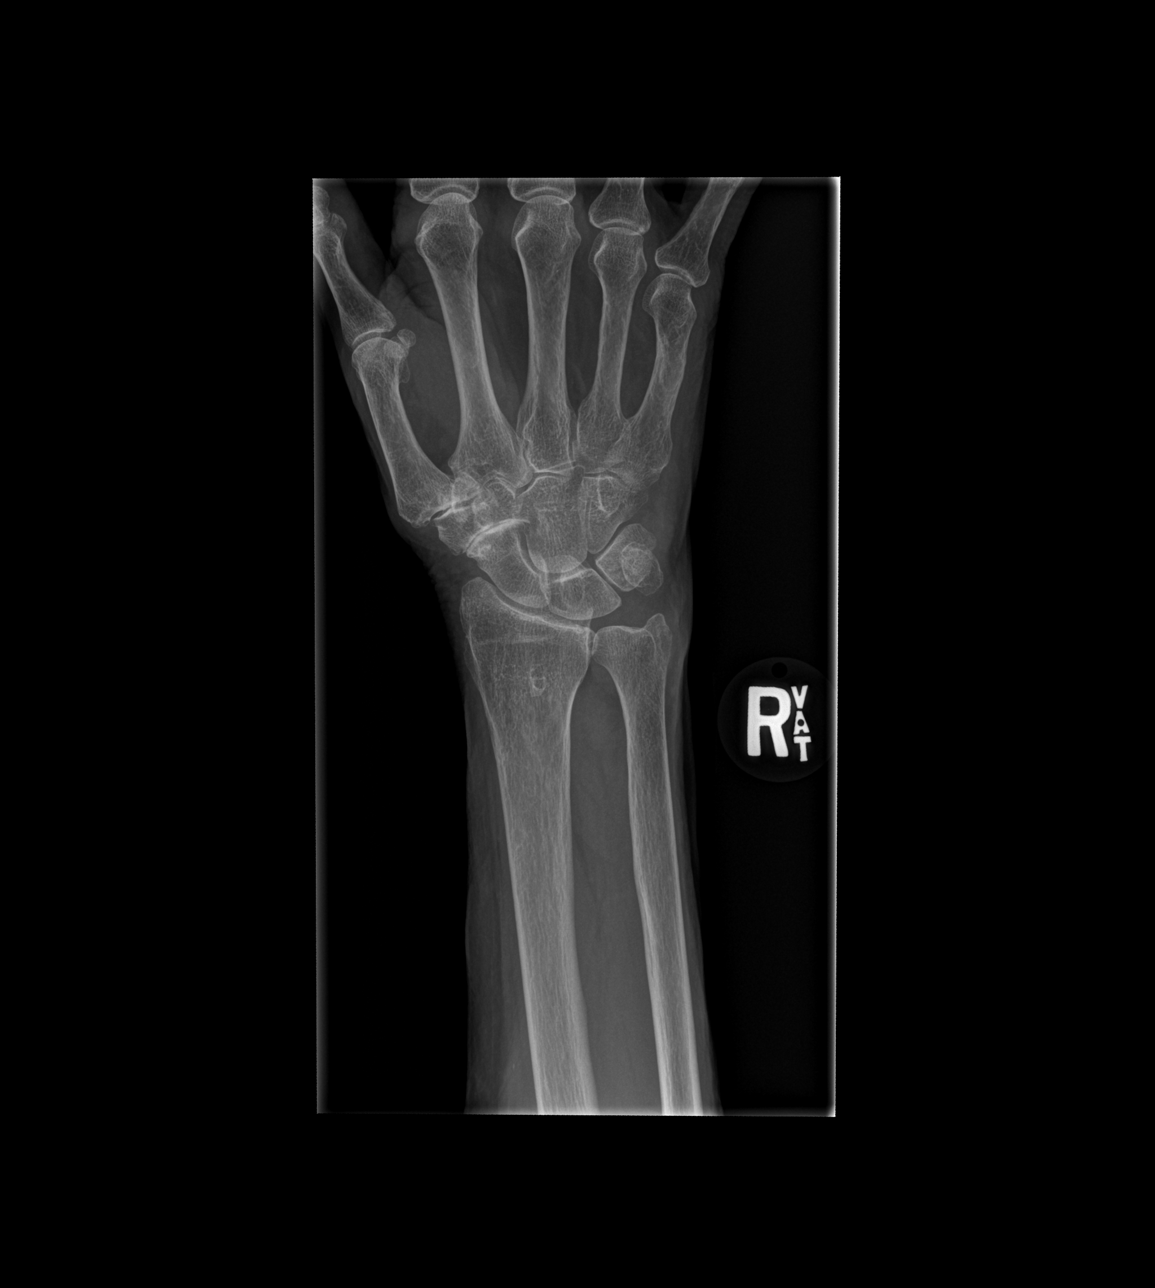

[x wrist obl right]
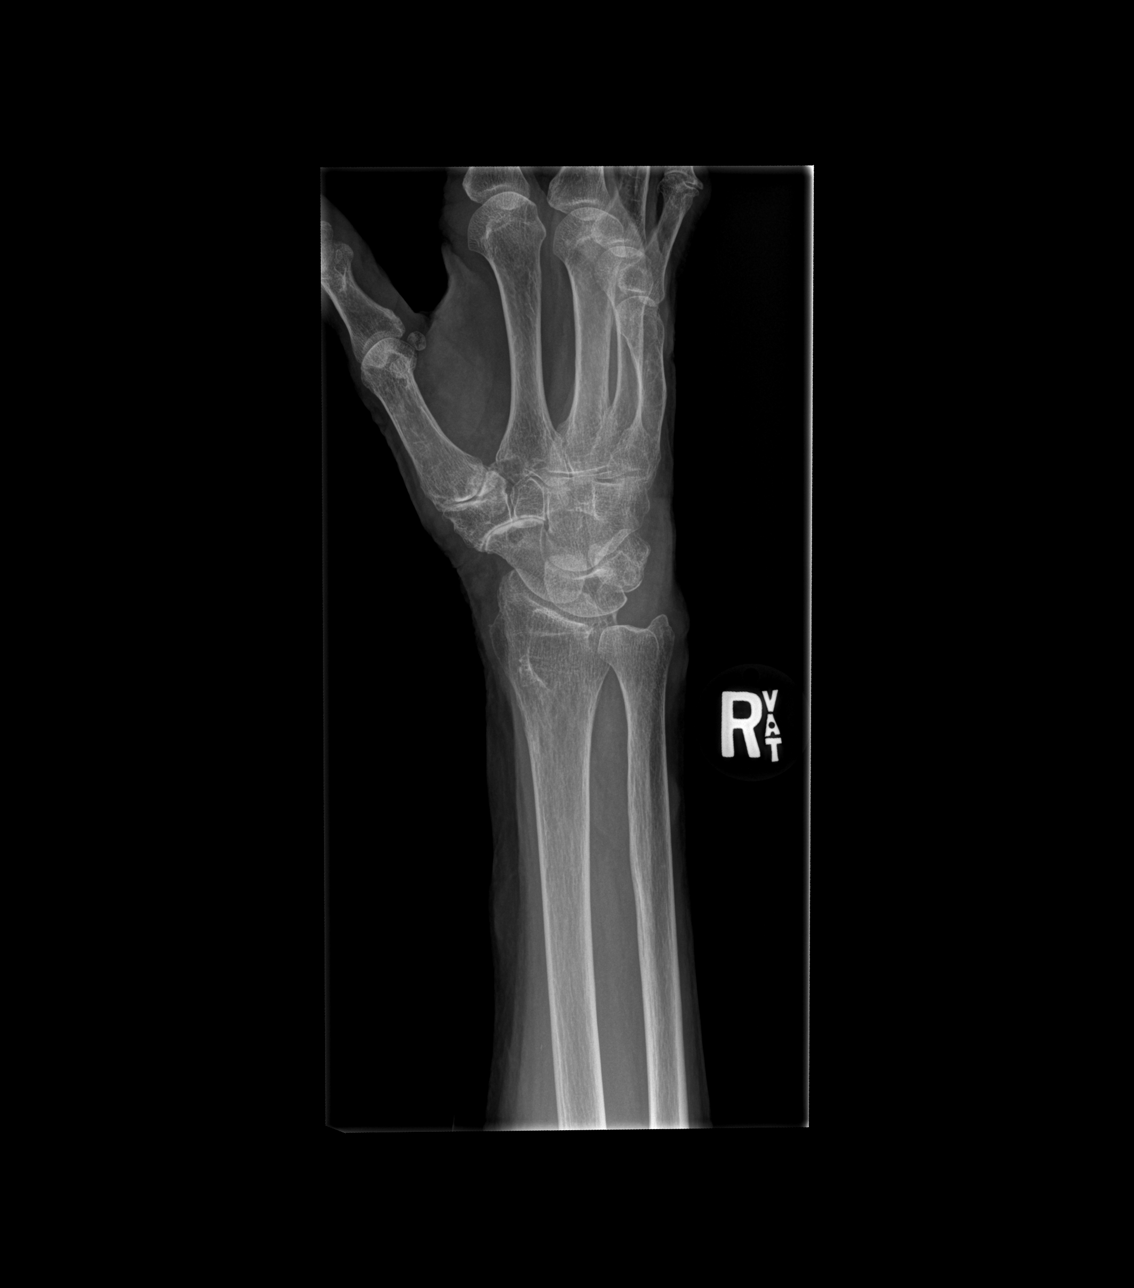

[x wrist lat right]
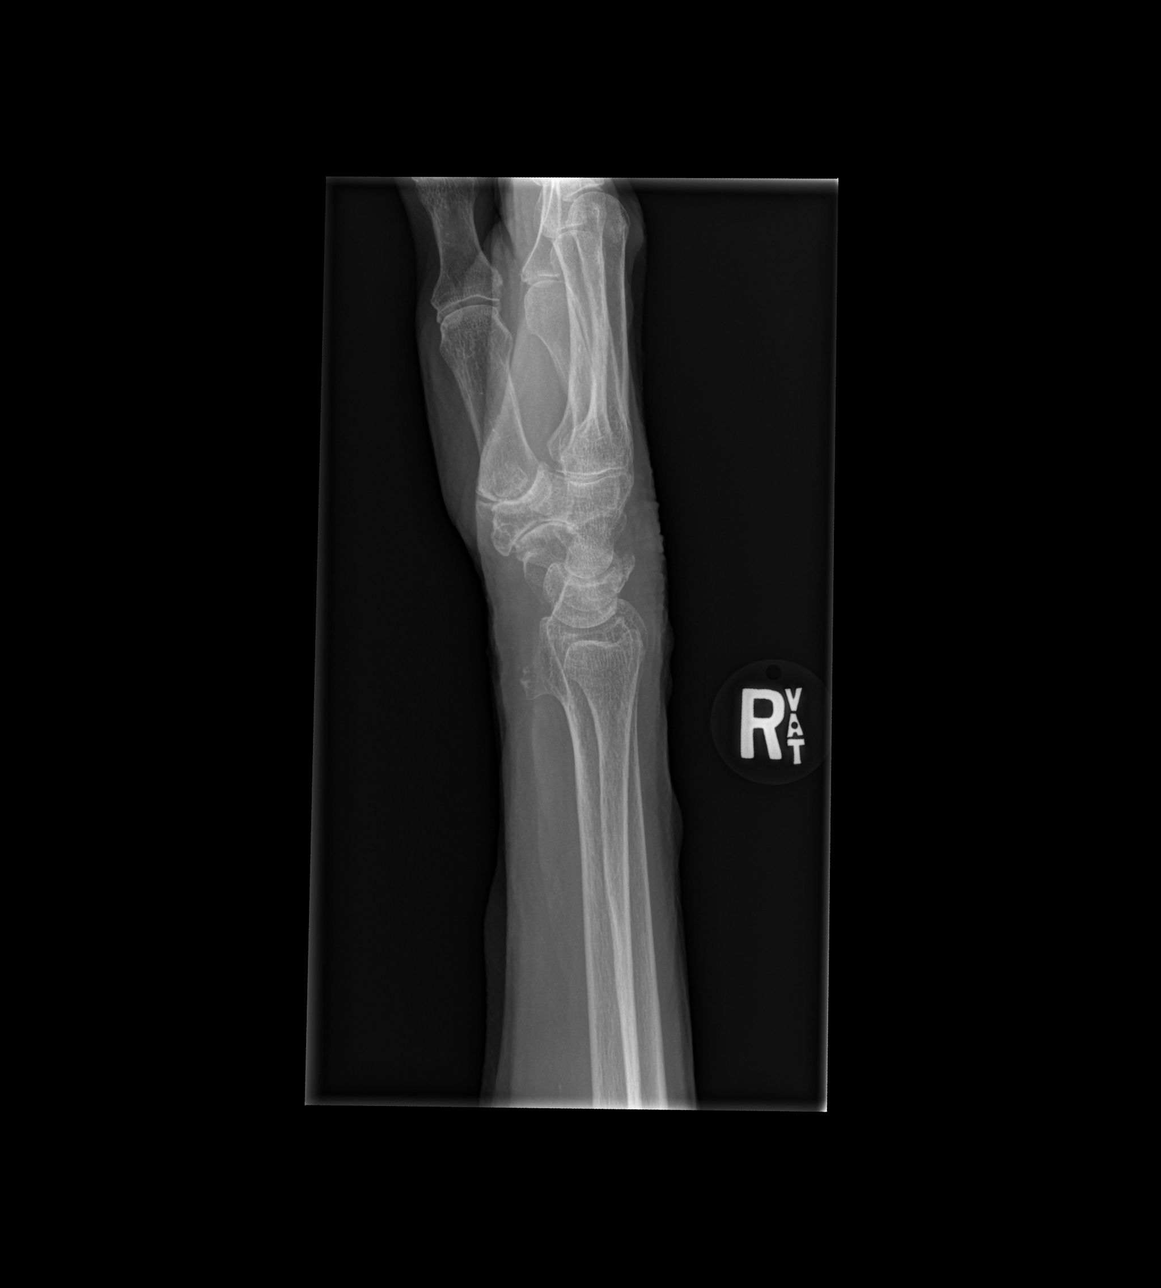

[x wrist navicular view right]
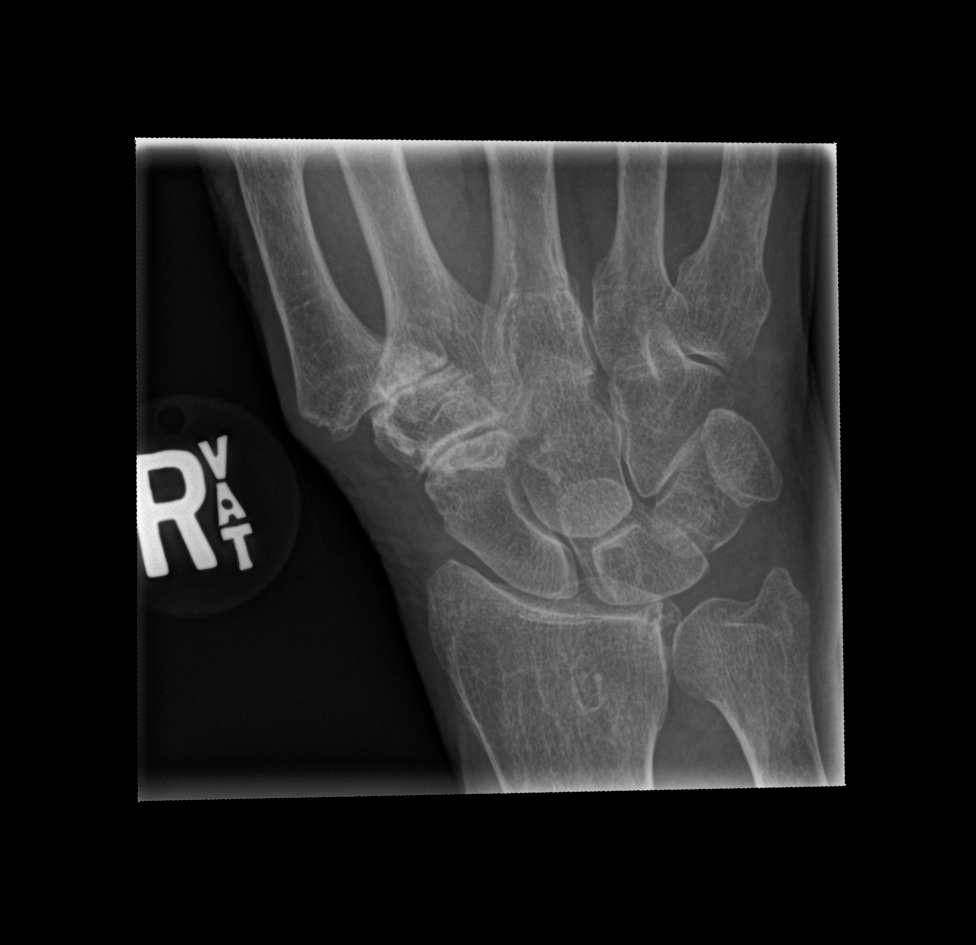

[4 of 4 positions shown; findings below may reference images not displayed]

FINDINGS: Mildly displaced triquetrum fracture is noted on lateral
projection.. Degenerative changes seen involving the intercarpal
space between scaphoid bone, trapezium and trapezoid. Moderate
degenerative changes seen involving the first carpometacarpal joint.
Soft tissues are unremarkable.
IMPRESSION: Degenerative joint disease as described above. Probable mildly
displaced triquetrum fracture..

## 2019-09-17 NOTE — Telephone Encounter (Signed)
Dr. Collene Mares declined to see this patient as she is established with [facility] and Dr. Collene Mares is not accepting new patients.  I spoke with Mr Clementeen Graham, Ms Freiberger son.  I also spoke with Jerry Caras and they will call Mr. Kevan Ny to set up appointment.  Mr Kevan Ny verbalized understanding.

## 2019-09-22 ENCOUNTER — Encounter: Payer: Self-pay | Admitting: Physician Assistant

## 2019-09-22 ENCOUNTER — Ambulatory Visit (INDEPENDENT_AMBULATORY_CARE_PROVIDER_SITE_OTHER): Payer: Medicare Other | Admitting: Physician Assistant

## 2019-09-22 VITALS — BP 110/70 | HR 84 | Temp 98.3°F | Ht 68.5 in | Wt 144.4 lb

## 2019-09-22 DIAGNOSIS — R948 Abnormal results of function studies of other organs and systems: Secondary | ICD-10-CM | POA: Diagnosis not present

## 2019-09-22 DIAGNOSIS — Z01818 Encounter for other preprocedural examination: Secondary | ICD-10-CM | POA: Diagnosis not present

## 2019-09-22 MED ORDER — NA SULFATE-K SULFATE-MG SULF 17.5-3.13-1.6 GM/177ML PO SOLN: 1.0000 | Freq: Once | ORAL | 0 refills | Status: AC

## 2019-09-22 NOTE — Progress Notes (Signed)
Subjective:    Patient ID: Marissa Caldwell, female    DOB: Sep 02, 1931, 86 y.o.   MRN: [id number]  HPI Marissa Caldwell is a pleasant 86 year old white female, new to GI today referred by Dr. Burr Medico with abnormal PET scan. Patient was diagnosed with breast cancer in the summer 2020, invasive ductal adenocarcinoma with lymphovascular invasion.  She underwent lumpectomy in October 2020.  She had PET scan on 07/22/2019 showing tiny subpectoral nodes concerning for mets, a 10 mm precarinal lymph node with low-level hypermetabolism, and an 11 mm groundglass nodule right upper lobe with low-level FDG accumulation.  There was focal hypermetabolism in the right colon potentially related to the ileocecal valve but incompletely characterized and adenoma or carcinoma could have this appearance.  Also with focal hypermetabolism in the region of the left pubic bone and adjacent sigmoid.  Patient was referred for colonoscopy, plan is for follow-up MRI at 3 months. Patient says that she did have a prior colonoscopy last 1 between 10 and 15 years ago here in [place].  She is not aware of any history of polyps but has had diverticulosis.  She has had problems with chronic mild constipation.  Currently using Metamucil which is not always helpful.  She denies any abdominal pain, no changes in her bowel habits, no melena or hematochezia.  She will sometimes get rectal pressure when constipated. Other medical problems include coronary artery disease status post stent in 2018, not on any blood thinner or antiplatelet.  COPD without oxygen use, hypothyroidism and CHF with EF of 55 to 60%.  Review of Systems Pertinent positive and negative review of systems were noted in the above HPI section.  All other review of systems was otherwise negative.  Outpatient Encounter Medications as of 09/22/2019  Medication Sig  . albuterol (PROVENTIL HFA;VENTOLIN HFA) 108 (90 Base) MCG/ACT inhaler Inhale 2 puffs into the lungs every 6 (six) hours  as needed (for shortness of breath/wheezing.).   Marland Kitchen aspirin EC 81 MG tablet Take 81 mg by mouth daily.  . brimonidine (ALPHAGAN) 0.2 % ophthalmic solution Place 1 drop into the left eye 2 (two) times daily.   . budesonide-formoterol (SYMBICORT) 160-4.5 MCG/ACT inhaler Inhale 2 puffs into the lungs 2 (two) times daily.  . Ca Carbonate-Mag Hydroxide (ROLAIDS PO) Take 1-2 tablets by mouth daily as needed (acid reflux).  . Calcium Carb-Cholecalciferol (CALCIUM + D3) 600-200 MG-UNIT TABS Take 1 tablet by mouth daily.  . carvedilol (COREG) 3.125 MG tablet TAKE 1 TABLET BY MOUTH TWICE DAILY (Patient taking differently: Take 3.125 mg by mouth See admin instructions. Take 3.125 in the morning and may take a second 3.125 mg dose in the evening as needed for SBP over 125)  . Cholecalciferol (VITAMIN D3) 2000 units capsule Take 4,000 Units by mouth daily.  . Coenzyme Q10 (COQ10) 100 MG CAPS Take 100 mg by mouth daily.  Marland Kitchen HYDROcodone-acetaminophen (NORCO/VICODIN) 5-325 MG tablet Take 1-2 tablets by mouth every 6 (six) hours as needed for moderate pain or severe pain.  Marland Kitchen latanoprost (XALATAN) 0.005 % ophthalmic solution Place 1 drop into both eyes at bedtime.  Marland Kitchen levothyroxine (SYNTHROID, LEVOTHROID) 100 MCG tablet Take 100 mcg by mouth daily before breakfast.   . LORazepam (ATIVAN) 0.5 MG tablet Take 0.5 mg by mouth daily at 2 PM.   . LUTEIN PO Take 1 tablet by mouth daily.  . mirtazapine (REMERON) 15 MG tablet Take 15 mg by mouth at bedtime.  . Multiple Vitamin (MULTIVITAMIN) tablet Take 1 tablet by  mouth daily.  . nitroGLYCERIN (NITROSTAT) 0.4 MG SL tablet Place 1 tablet (0.4 mg total) under the tongue every 5 (five) minutes as needed for chest pain.  . Omega-3 Fatty Acids (FISH OIL PO) Take 1 capsule by mouth daily.  . pravastatin (PRAVACHOL) 40 MG tablet TAKE 1 TABLET BY MOUTH DAILY GENERIC EQUIVALENT FOR PRAVACHOL  . tamoxifen (NOLVADEX) 20 MG tablet Take 1 tablet (20 mg total) by mouth daily.  . TOVIAZ 4  MG TB24 tablet Take 4 mg by mouth daily.   . Na Sulfate-K Sulfate-Mg Sulf 17.5-3.13-1.6 GM/177ML SOLN Take 1 kit by mouth once for 1 dose.   Facility-Administered Encounter Medications as of 09/22/2019  Medication  . aflibercept (EYLEA) SOLN 2 mg  . aflibercept (EYLEA) SOLN 2 mg  . aflibercept (EYLEA) SOLN 2 mg  . aflibercept (EYLEA) SOLN 2 mg  . aflibercept (EYLEA) SOLN 2 mg  . Bevacizumab (AVASTIN) SOLN 1.25 mg  . Bevacizumab (AVASTIN) SOLN 1.25 mg  . Bevacizumab (AVASTIN) SOLN 1.25 mg   Allergies  Allergen Reactions  . Lipitor [Atorvastatin]     Elevated LFT's   Patient Active Problem List   Diagnosis Date Noted  . Malignant neoplasm of upper-outer quadrant of right breast in female, estrogen receptor positive ([place]) 04/03/2019  . Chronic diastolic heart failure ([place]) 06/25/2017  . COPD (chronic obstructive pulmonary disease) ([place]) 04/13/2017  . Chest pain 03/27/2017  . CAD S/P percutaneous coronary angioplasty 03/26/2017  . Abnormal nuclear stress test 03/25/2017  . Dyslipidemia 03/11/2017  . Hypothyroidism 03/11/2017  . Shortness of breath 03/11/2017   Social History   Socioeconomic History  . Marital status: Widowed    Spouse name: Not on file  . Number of children: 1  . Years of education: Not on file  . Highest education level: Not on file  Occupational History  . Occupation: retired  Tobacco Use  . Smoking status: Current Some Day Smoker    Packs/day: 0.50    Years: 60.00    Pack years: 30.00    Types: Cigarettes  . Smokeless tobacco: Never Used  Substance and Sexual Activity  . Alcohol use: Yes    Comment: twice a month  . Drug use: No  . Sexual activity: Not Currently  Other Topics Concern  . Not on file  Social History Narrative  . Not on file   Social Determinants of Health   Financial Resource Strain:   . Difficulty of Paying Living Expenses: Not on file  Food Insecurity:   . Worried About Charity fundraiser in the Last Year: Not on file  .  Ran Out of Food in the Last Year: Not on file  Transportation Needs:   . Lack of Transportation (Medical): Not on file  . Lack of Transportation (Non-Medical): Not on file  Physical Activity:   . Days of Exercise per Week: Not on file  . Minutes of Exercise per Session: Not on file  Stress:   . Feeling of Stress : Not on file  Social Connections:   . Frequency of Communication with Friends and Family: Not on file  . Frequency of Social Gatherings with Friends and Family: Not on file  . Attends Religious Services: Not on file  . Active Member of Clubs or Organizations: Not on file  . Attends Archivist Meetings: Not on file  . Marital Status: Not on file  Intimate Partner Violence:   . Fear of Current or Ex-Partner: Not on file  . Emotionally Abused:  Not on file  . Physically Abused: Not on file  . Sexually Abused: Not on file    Marissa Caldwell's family history includes CAD in her mother; Cataracts in her mother; Coronary artery disease in her father; Leukemia in her sister.      Objective:    Vitals:   09/22/19 1003  BP: 110/70  Pulse: 84  Temp: 98.3 F (36.8 C)    Physical Exam.Well-developed well-nourished elderly white female in no acute distress.  Accompanied by her son height, Weight, 144 BMI 21.6  HEENT; nontraumatic normocephalic, EOMI, PER R LA, sclera anicteric.  She is hard of hearing Oropharynx; not examined Neck; supple, no JVD Cardiovascular; regular rate and rhythm with S1-S2, no murmur rub or gallop Pulmonary; Clear bilaterally Abdomen; soft, nontender, nondistended, no palpable mass, slight fullness in the right lower quadrant, no hepatosplenomegaly, bowel sounds are active Rectal; not done today Skin; benign exam, no jaundice rash or appreciable lesions Extremities; no clubbing cyanosis or edema skin warm and dry Neuro/Psych; alert and oriented x4, grossly nonfocal mood and affect appropriate       Assessment & Plan:   #60 86 year old white  female diagnosed with invasive ductal adenocarcinoma of the breast 2020 status post lumpectomy with recent PET scan showing multiple abnormalities including focal hypermetabolism of the right colon in the area of the ileocecal valve and also the region of the left pubic bone and adjacent sigmoid. Rule out  Colon cancer versus advanced polyp.  #2 mild chronic constipation 3.  Coronary artery disease status post prior stent, no antiplatelet therapy 4.  COPD no oxygen use 5.  Hypothyroidism  Plan; patient will be scheduled for colonoscopy with Dr. Silverio Decamp.  Procedure was discussed in detail with the patient including indications risks and benefits and she is agreeable to proceed. Start MiraLAX 17 g in 8 ounces of water daily.  Marni Franzoni Genia Harold PA-C 09/22/2019   Cc: [name], MD

## 2019-09-22 NOTE — Progress Notes (Signed)
  Patient Name: Marissa Caldwell MRN: [id number] DOB: [birth year] Referring Physician: [name] (Profile Not Attached) Date of Service: 09/03/2019 [facility], [place]                                                        End Of Treatment Note  Diagnoses: C50.411-Malignant neoplasm of upper-outer quadrant of right female breast  Cancer Staging: Cancer Staging Malignant neoplasm of upper-outer quadrant of right breast in female, estrogen receptor positive ([place]) Staging form: Breast, AJCC 8th Edition - Clinical: Stage IIA (cT1c, cN1, cM0, G3, ER+, PR+, HER2-) - Signed by [name], MD on 04/09/2019 - Pathologic stage from 06/19/2019: Stage IB (pT1c, pN2a, cM0, G2, ER+, PR+, HER2-) - Signed by [name], MD on 07/03/2019  Intent: Curative  Radiation Treatment Dates: 08/05/2019 through 09/03/2019 Site Technique Total Dose (Gy) Dose per Fx (Gy) Completed Fx Beam Energies  Breast, Right: Breast_Rt 3D 42.56/42.56 2.66 16/16 6X, 10X  Breast, Right: Breast_Rt_PAB_SCV 3D 42.56/42.56 2.66 16/16 6X, 10X  Breast, Right: Breast_Rt_Bst specialPort 8/8 2 4/4 9E, 12E   Narrative: The patient tolerated radiation therapy relatively well.   Plan: The patient will follow-up with radiation oncology in 1 mo, or as needed. -----------------------------------  Eppie Gibson, MD

## 2019-09-22 NOTE — Patient Instructions (Addendum)
If you are age 84 or older, your body mass index should be between 23-30. Your Body mass index is 21.63 kg/m. If this is out of the aforementioned range listed, please consider follow up with your Primary Care Provider.  If you are age 28 or younger, your body mass index should be between 19-25. Your Body mass index is 21.63 kg/m. If this is out of the aformentioned range listed, please consider follow up with your Primary Care Provider.   You have been scheduled for a colonoscopy. Please follow written instructions given to you at your visit today.  Please pick up your prep supplies at the pharmacy within the next 1-3 days. If you use inhalers (even only as needed), please bring them with you on the day of your procedure.  We have sent the following medications to your pharmacy for you to pick up at your convenience:  Suprep  Due to recent changes in healthcare laws, you may see the results of your imaging and laboratory studies on MyChart before your provider has had a chance to review them.  We understand that in some cases there may be results that are confusing or concerning to you. Not all laboratory results come back in the same time frame and the provider may be waiting for multiple results in order to interpret others.  Please give [place] 48 hours in order for your provider to thoroughly review all the results before contacting the office for clarification of your results.

## 2019-09-29 NOTE — Progress Notes (Signed)
Reviewed and agree with documentation and assessment and plan. K. Veena Ledell Codrington , MD   

## 2019-09-29 NOTE — Progress Notes (Signed)
[facility] Note  10/03/2019     CHIEF COMPLAINT Patient presents for Retina Follow Up   HISTORY OF PRESENT ILLNESS: Marissa Caldwell is a 86 y.o. female who presents to the clinic today for:   HPI    Retina Follow Up    Patient presents with  Wet AMD.  In right eye.  This started months ago.  Severity is severe.  Duration of 13 weeks.  Since onset it is stable.  I, the attending physician,  performed the HPI with the patient and updated documentation appropriately.          Comments    86 y/o female pt here for 13 wk f/u for exu ARMD w/active CNV OD.  Lost earlier f/u due to pt undegoing sx and radiation for breast cancer.  No change in [place] OU.  Denies pain, flashes, floaters.  Latanoprost QHS OU.       Last edited by Bernarda Caffey, MD on 10/03/2019 11:44 PM. (History)    pt is delayed to follow up due breast cancer sx.  Pt states her vision has been pretty good.  She complains that she is unable to see anything centrally OD, but can see fine peripherally.  Referring physician: [name], MD [address] [place],  [place] [address]  HISTORICAL INFORMATION:   Selected notes from the MEDICAL RECORD NUMBER Referred by Dr. Quentin Ore for concern of exudative ARMD OD   CURRENT MEDICATIONS: Current Outpatient Medications (Ophthalmic Drugs)  Medication Sig  . latanoprost (XALATAN) 0.005 % ophthalmic solution Place 1 drop into both eyes at bedtime.  . brimonidine (ALPHAGAN) 0.2 % ophthalmic solution Place 1 drop into the left eye 2 (two) times daily.    Current Facility-Administered Medications (Ophthalmic Drugs)  Medication Route  . aflibercept (EYLEA) SOLN 2 mg Intravitreal  . aflibercept (EYLEA) SOLN 2 mg Intravitreal  . aflibercept (EYLEA) SOLN 2 mg Intravitreal  . aflibercept (EYLEA) SOLN 2 mg Intravitreal  . aflibercept (EYLEA) SOLN 2 mg Intravitreal   Current Outpatient Medications (Other)  Medication Sig  . albuterol  (PROVENTIL HFA;VENTOLIN HFA) 108 (90 Base) MCG/ACT inhaler Inhale 2 puffs into the lungs every 6 (six) hours as needed (for shortness of breath/wheezing.).   Marland Kitchen aspirin EC 81 MG tablet Take 81 mg by mouth daily.  . budesonide-formoterol (SYMBICORT) 160-4.5 MCG/ACT inhaler Inhale 2 puffs into the lungs 2 (two) times daily.  . Ca Carbonate-Mag Hydroxide (ROLAIDS PO) Take 1-2 tablets by mouth daily as needed (acid reflux).  . Calcium Carb-Cholecalciferol (CALCIUM + D3) 600-200 MG-UNIT TABS Take 1 tablet by mouth daily.  . carvedilol (COREG) 3.125 MG tablet TAKE 1 TABLET BY MOUTH TWICE DAILY (Patient taking differently: Take 3.125 mg by mouth See admin instructions. Take 3.125 in the morning and may take a second 3.125 mg dose in the evening as needed for SBP over 125)  . Cholecalciferol (VITAMIN D3) 2000 units capsule Take 4,000 Units by mouth daily.  . Coenzyme Q10 (COQ10) 100 MG CAPS Take 100 mg by mouth daily.  Marland Kitchen HYDROcodone-acetaminophen (NORCO/VICODIN) 5-325 MG tablet Take 1-2 tablets by mouth every 6 (six) hours as needed for moderate pain or severe pain.  Marland Kitchen levothyroxine (SYNTHROID, LEVOTHROID) 100 MCG tablet Take 100 mcg by mouth daily before breakfast.   . LORazepam (ATIVAN) 0.5 MG tablet Take 0.5 mg by mouth daily at 2 PM.   . LUTEIN PO Take 1 tablet by mouth daily.  . mirtazapine (REMERON)  15 MG tablet Take 15 mg by mouth at bedtime.  . Multiple Vitamin (MULTIVITAMIN) tablet Take 1 tablet by mouth daily.  . nitroGLYCERIN (NITROSTAT) 0.4 MG SL tablet Place 1 tablet (0.4 mg total) under the tongue every 5 (five) minutes as needed for chest pain.  . Omega-3 Fatty Acids (FISH OIL PO) Take 1 capsule by mouth daily.  . pravastatin (PRAVACHOL) 40 MG tablet TAKE 1 TABLET BY MOUTH DAILY GENERIC EQUIVALENT FOR PRAVACHOL  . tamoxifen (NOLVADEX) 20 MG tablet Take 1 tablet (20 mg total) by mouth daily.  . TOVIAZ 4 MG TB24 tablet Take 4 mg by mouth daily.    Current Facility-Administered Medications  (Other)  Medication Route  . Bevacizumab (AVASTIN) SOLN 1.25 mg Intravitreal  . Bevacizumab (AVASTIN) SOLN 1.25 mg Intravitreal  . Bevacizumab (AVASTIN) SOLN 1.25 mg Intravitreal      REVIEW OF SYSTEMS: ROS    Positive for: Cardiovascular, Eyes, Respiratory   Negative for: Constitutional, Gastrointestinal, Neurological, Skin, Genitourinary, Musculoskeletal, HENT, Endocrine, Psychiatric, Allergic/Imm, Heme/Lymph   Last edited by Matthew Folks, COA on 10/03/2019  1:57 PM. (History)       ALLERGIES Allergies  Allergen Reactions  . Lipitor [Atorvastatin]     Elevated LFT's    PAST MEDICAL HISTORY Past Medical History:  Diagnosis Date  . Anxiety   . Arthritis   . Breast cancer ([place])   . CAD (coronary artery disease)    a. 03/2017: 95% LCx stenosis --> PCI/DES placed  . Cataract   . Chest pain 03/27/2017  . Chronic diastolic heart failure ([place]) 06/25/2017  . Chronic lower back pain   . COPD (chronic obstructive pulmonary disease) ([place])   . Coronary artery disease   . Diverticulitis   . Diverticulosis   . GERD (gastroesophageal reflux disease)   . Glaucoma   . Heart failure, type unknown ([place]) 03/11/2017  . Hyperlipidemia 03/11/2017  . Hypertension   . Hypothyroidism   . Macular degeneration    wet and dry per pt's son  . Pneumonia X 1   "years and years ago" (03/26/2017)  . Shortness of breath 03/11/2017  . Status post dilation of esophageal narrowing    Past Surgical History:  Procedure Laterality Date  . BILATERAL OOPHORECTOMY Bilateral   . BREAST LUMPECTOMY WITH RADIOACTIVE SEED AND SENTINEL LYMPH NODE BIOPSY Right 06/19/2019   Procedure: RIGHT BREAST LUMPECTOMY WITH RADIOACTIVE SEED AND SENTINEL LYMPH NODE BIOPSY;  Surgeon: Jovita Kussmaul, MD;  Location: [facility];  Service: General;  Laterality: Right;  . CATARACT EXTRACTION    . CATARACT EXTRACTION W/ INTRAOCULAR LENS  IMPLANT, BILATERAL Bilateral   . CORONARY ANGIOPLASTY WITH STENT PLACEMENT  03/26/2017  . LEFT  HEART CATH AND CORONARY ANGIOGRAPHY N/A 03/26/2017   Procedure: Left Heart Cath and Coronary Angiography;  Surgeon: Belva Crome, MD;  Location: [facility];  Service: Cardiovascular;  Laterality: N/A;  . NERVE SURGERY     "lump on my sciatic nerve was removed years ago"  . SHOULDER OPEN ROTATOR CUFF REPAIR Right   . THYROIDECTOMY, PARTIAL      FAMILY HISTORY Family History  Problem Relation Age of Onset  . CAD Mother   . Cataracts Mother   . Coronary artery disease Father   . Leukemia Sister   . Amblyopia Neg Hx   . Blindness Neg Hx   . Diabetes Neg Hx   . Glaucoma Neg Hx   . Macular degeneration Neg Hx   . Retinal detachment Neg Hx   .  Strabismus Neg Hx   . Retinitis pigmentosa Neg Hx     SOCIAL HISTORY Social History   Tobacco Use  . Smoking status: Current Some Day Smoker    Packs/day: 0.50    Years: 60.00    Pack years: 30.00    Types: Cigarettes  . Smokeless tobacco: Never Used  Substance Use Topics  . Alcohol use: Yes    Comment: twice a month  . Drug use: No         OPHTHALMIC EXAM:  Base Eye Exam    Visual Acuity (Snellen - Linear)      Right Left   Dist cc CF @ 3' 20/40 -2   Dist ph cc NI NI   Correction: Glasses       Tonometry (Tonopen, 1:59 PM)      Right Left   Pressure 11 12       Pupils      Dark Light Shape React APD   Right 3 2 Round Minimal None   Left 3 2 Round Minimal None       Visual Fields (Counting fingers)      Left Right    Full Full       Extraocular Movement      Right Left    Full, Ortho Full, Ortho       Neuro/Psych    Oriented x3: Yes   Mood/Affect: Normal       Dilation    Both eyes: 1.0% Mydriacyl, 2.5% Phenylephrine @ 1:59 PM        Slit Lamp and Fundus Exam    Slit Lamp Exam      Right Left   Lids/Lashes Dermatochalasis - upper lid, Telangiectasia Dermatochalasis - upper lid, Telangiectasia   Conjunctiva/Sclera White and quiet White and quiet   Cornea Arcus, 2+ diffuse Punctate  epithelial erosions, diffuse endo pigment, decreased TBUT, Krukenberg's spindle Arcus, 2-3+ diffuse Punctate epithelial erosions, mild corneal haze centrally, diffuse endo pigment, decreased TBUT, Krukenberg's spindle   Anterior Chamber Deep and quiet Deep and quiet   Iris Round and dilated Round and dilated   Lens Three piece PC IOL in good position, open PC Three piece PC IOL in good position, clear PC   Vitreous Vitreous syneresis, Posterior vitreous detachment, mild vitreous condensations Mild Vitreous syneresis, Posterior vitreous detachment       Fundus Exam      Right Left   Disc sharp rim, 360 Peripapillary atrophy 360 Peripapillary atrophy, trace temporal pallor, sharp rim   C/D Ratio 0.2 0.1   Macula Blunted foveal reflex, Drusen, RPE mottling and clumping, +PED, +CNVM, sub-retinal Disciform scar, punctate IRH nasal macula Blunted foveal reflex, Drusen, RPE mottling, clumping, and Atrophy, focal GA nasal macula--slightly increased   Vessels Vascular attenuation Vascular attenuation   Periphery Attached Attached          IMAGING AND PROCEDURES  Imaging and Procedures for @TODAY @  OCT, Retina - OU - Both Eyes       Right Eye Quality was good. Central Foveal Thickness: 450. Progression has been stable. Findings include subretinal hyper-reflective material, choroidal neovascular membrane, retinal drusen , epiretinal membrane, disciform scar, abnormal foveal contour, pigment epithelial detachment (Interval decrease in peripapillary / nasal macula IRF and SRF; persistent thick PED/SRHM/disciform scar).   Left Eye Quality was good. Central Foveal Thickness: 208. Progression has been stable. Findings include abnormal foveal contour, retinal drusen , no IRF, no SRF, outer retinal atrophy, pigment epithelial detachment (?interval increase in GA  area in en face image).   Notes *Images captured and stored on drive  Diagnosis / Impression:  OD: Exudative AMD with significant  submacular scar/SRHM- stable improvement in peripapillary / nasal macula IRF and SRF; persistent thick PED/SRHM/disciform scar--slightly improved OS: Non-exudative AMD w/ GA - stable from prior  Clinical management:  See below  Abbreviations: NFP - Normal foveal profile. CME - cystoid macular edema. PED - pigment epithelial detachment. IRF - intraretinal fluid. SRF - subretinal fluid. EZ - ellipsoid zone. ERM - epiretinal membrane. ORA - outer retinal atrophy. ORT - outer retinal tubulation. SRHM - subretinal hyper-reflective material         Intravitreal Injection, Pharmacologic Agent - OD - Right Eye       Time Out 10/03/2019. 1:50 PM. Confirmed correct patient, procedure, site, and patient consented.   Anesthesia Topical anesthesia was used. Anesthetic medications included Lidocaine 2%, Proparacaine 0.5%.   Procedure Preparation included 5% betadine to ocular surface, eyelid speculum. A supplied (32 g) needle was used.   Injection:  2 mg aflibercept Alfonse Flavors) SOLN   NDC: A3590391, Lot: [id number], Expiration date: 03/16/2020   Route: Intravitreal, Site: Right Eye, Waste: 0.05 mL  Post-op Post injection exam found visual acuity of at least counting fingers. The patient tolerated the procedure well. There were no complications. The patient received written and verbal post procedure care education.                 ASSESSMENT/PLAN:    ICD-10-CM   1. 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 Retinal edema  H35.81 OCT, Retina - OU - Both Eyes  4. Posterior vitreous detachment of both eyes  H43.813   5. Pigmentary glaucoma of both eyes, mild stage  H40.1331   6. Pseudophakia of both eyes  Z96.1     1. Exudative  age related macular degeneration, OD  - onset ~2 mos prior to presentation, waited for scheduled appt with Dr. Kathlen Mody  - S/P IVA OD #1 (05.20.19), #2 (07.02.19), #3 (07.30.19)  - S/P IVE OD #1 (08.27.19), #2 (09.24.19), #3 (11.05.19), #4 (12.17.19), #5 (02.21.20), #6 (05.05.20), # 7 (08.05.20), #8 (9.2.20), #9 (11.10.20)  - today, delayed f/u from 4 wks to 9+ wks due to R breast cancer diagnosis, surgery, and radiation  - VA remains CF  - OCT with stable improvment in peripapillary / nasal macula IRF and SRF; persistent thick PED/SRHM/disciform scar--slightly improved  - discussed findings and guarded prognosis and treatment options -- pt wishes to continue maintenance therapy for preservation of peripheral vision  - switch in therapy to [facility] approved through [employer]  - recommend IVE OD #10 today (02.12.21) w/ f/u in 3 months for maintenance  - pt wishes to be treated with IVE OD  - RBA of procedure discussed, questions answered  - informed consent obtained and signed  - see procedure note  - Eylea paperwork and benefits investigation started on 07.30.19- Approved for 2020 [employer]  - f/u in 3 months -- DFE/OCT/ possible injection  2. Age related macular degeneration, non-exudative, left eye  - possible progression of geographic atrophy on exam and OCT today  - The incidence, anatomy, and pathology of dry AMD, risk of progression, and the AREDS and AREDS 2 study including smoking  risks discussed with patient.  - continue amsler grid monitoring  3. Retinal edema as above  4. PVD / vitreous syneresis OU  Discussed findings and prognosis  No RT or RD on 360 peripheral exam  Reviewed s/s of RT/RD  Strict return precautions for any such RT/RD signs/symptoms  5. Pigmentary glaucoma OU-   - using latanoprost OU qhs  - IOP good today  - under the expert care of Dr. Quentin Ore  - monitor  6. Pseudophakia OU  - s/p CE/IOL OU  - beautiful surgery, doing well  -  monitor   Ophthalmic Meds Ordered this visit:  Meds ordered this encounter  Medications  . aflibercept (EYLEA) SOLN 2 mg       Return in about 3 months (around 12/31/2019) for ARMD OU, Dilated exam, OCT.  There are no Patient Instructions on file for this visit.   Explained the diagnoses, plan, and follow up with the patient and they expressed understanding.  Patient expressed understanding of the importance of proper follow up care.   This document serves as a record of services personally performed by Gardiner Sleeper, MD, PhD. It was created on their behalf by Ernest Mallick, OA, an ophthalmic assistant. The creation of this record is the provider's dictation and/or activities during the visit.    Electronically signed by: Ernest Mallick, OA 02.08.2021 11:57 PM  Gardiner Sleeper, M.D., Ph.D. Diseases & Surgery of the Retina and Vitreous [facility]   I have reviewed the above documentation for accuracy and completeness, and I agree with the above. Gardiner Sleeper, M.D., Ph.D. 10/03/19 11:57 PM   Abbreviations: M myopia (nearsighted); A astigmatism; H hyperopia (farsighted); P presbyopia; Mrx spectacle prescription;  CTL contact lenses; OD right eye; OS left eye; OU both eyes  XT exotropia; ET esotropia; PEK punctate epithelial keratitis; PEE punctate epithelial erosions; DES dry eye syndrome; MGD meibomian gland dysfunction; ATs artificial tears; PFAT's preservative free artificial tears; [place] nuclear sclerotic cataract; PSC posterior subcapsular cataract; ERM epi-retinal membrane; PVD posterior vitreous detachment; RD retinal detachment; DM diabetes mellitus; DR diabetic retinopathy; NPDR non-proliferative diabetic retinopathy; PDR proliferative diabetic retinopathy; CSME clinically significant macular edema; DME diabetic macular edema; dbh dot blot hemorrhages; CWS cotton wool spot; POAG primary open angle glaucoma; C/D cup-to-disc ratio; HVF humphrey visual field;  GVF goldmann visual field; OCT optical coherence tomography; IOP intraocular pressure; BRVO Branch retinal vein occlusion; CRVO central retinal vein occlusion; CRAO central retinal artery occlusion; BRAO branch retinal artery occlusion; RT retinal tear; SB scleral buckle; PPV pars plana vitrectomy; VH Vitreous hemorrhage; PRP panretinal laser photocoagulation; IVK intravitreal kenalog; VMT vitreomacular traction; MH Macular hole;  NVD neovascularization of the disc; NVE neovascularization elsewhere; AREDS age related eye disease study; ARMD age related macular degeneration; POAG primary open angle glaucoma; EBMD epithelial/anterior basement membrane dystrophy; ACIOL anterior chamber intraocular lens; IOL intraocular lens; PCIOL posterior chamber intraocular lens; Phaco/IOL phacoemulsification with intraocular lens placement; [place] photorefractive keratectomy; LASIK laser assisted in situ keratomileusis; HTN hypertension; DM diabetes mellitus; COPD chronic obstructive pulmonary disease

## 2019-10-03 ENCOUNTER — Encounter (INDEPENDENT_AMBULATORY_CARE_PROVIDER_SITE_OTHER): Payer: Self-pay | Admitting: Ophthalmology

## 2019-10-03 ENCOUNTER — Ambulatory Visit (INDEPENDENT_AMBULATORY_CARE_PROVIDER_SITE_OTHER): Payer: Medicare Other | Admitting: Ophthalmology

## 2019-10-03 ENCOUNTER — Other Ambulatory Visit: Payer: Self-pay

## 2019-10-03 DIAGNOSIS — H401331 Pigmentary glaucoma, bilateral, mild stage: Secondary | ICD-10-CM

## 2019-10-03 DIAGNOSIS — H3581 Retinal edema: Secondary | ICD-10-CM | POA: Diagnosis not present

## 2019-10-03 DIAGNOSIS — H43813 Vitreous degeneration, bilateral: Secondary | ICD-10-CM

## 2019-10-03 DIAGNOSIS — Z961 Presence of intraocular lens: Secondary | ICD-10-CM

## 2019-10-03 DIAGNOSIS — H353211 Exudative age-related macular degeneration, right eye, with active choroidal neovascularization: Secondary | ICD-10-CM | POA: Diagnosis not present

## 2019-10-03 DIAGNOSIS — H353122 Nonexudative age-related macular degeneration, left eye, intermediate dry stage: Secondary | ICD-10-CM | POA: Diagnosis not present

## 2019-10-03 MED ORDER — AFLIBERCEPT 2MG/0.05ML IZ SOLN FOR KALEIDOSCOPE
2.0000 mg | INTRAVITREAL | Status: AC | PRN
  Administered 2019-10-03: 2 mg via INTRAVITREAL

## 2019-10-07 DIAGNOSIS — Z170 Estrogen receptor positive status [ER+]: Secondary | ICD-10-CM | POA: Diagnosis not present

## 2019-10-07 DIAGNOSIS — C50411 Malignant neoplasm of upper-outer quadrant of right female breast: Secondary | ICD-10-CM | POA: Diagnosis not present

## 2019-10-07 NOTE — Progress Notes (Signed)
I called the patient today about her upcoming follow-up appointment in radiation oncology.   Given the state of the COVID-19 pandemic, concerning case numbers in our community, and guidance from [facility], I offered a phone assessment with the patient to determine if coming to the clinic was necessary. She accepted.  I let the patient know that I had spoken with Dr. Isidore Moos, and she wanted them to know the importance of washing their hands for at least 20 seconds at a time, especially after going out in public, and before they eat.  Limit going out in public whenever possible. Do not touch your face, unless your hands are clean, such as when bathing. Get plenty of rest, eat well, and stay hydrated.   The patient denies any symptomatic concerns.  Specifically, they report good healing of their skin in the radiation fields.  Skin is intact.    I recommended that she continue skin care by applying oil or lotion with vitamin E to the skin in the radiation fields, BID, for 2 more months.  Continue follow-up with medical oncology - follow-up is scheduled on 12/10/19 with Dr. Burr Medico.  I explained that yearly mammograms are important for patients with intact breast tissue, and physical exams are important after mastectomy for patients that cannot undergo mammography.  I encouraged her to call if she had further questions or concerns about her healing. Otherwise, she will follow-up PRN in radiation oncology. Patient is pleased with this plan, and we will cancel her upcoming follow-up to reduce the risk of COVID-19 transmission.

## 2019-10-08 ENCOUNTER — Ambulatory Visit
Admission: RE | Admit: 2019-10-08 | Discharge: 2019-10-08 | Disposition: A | Discharge status: Home / Self Care | Payer: Medicare Other | Source: Ambulatory Visit | Attending: Radiation Oncology | Admitting: Radiation Oncology

## 2019-10-13 ENCOUNTER — Other Ambulatory Visit: Payer: Self-pay | Admitting: Gastroenterology

## 2019-10-13 ENCOUNTER — Ambulatory Visit (INDEPENDENT_AMBULATORY_CARE_PROVIDER_SITE_OTHER): Payer: Medicare Other

## 2019-10-13 DIAGNOSIS — Z1159 Encounter for screening for other viral diseases: Secondary | ICD-10-CM

## 2019-10-14 LAB — SARS CORONAVIRUS 2 (TAT 6-24 HRS): SARS Coronavirus 2: NEGATIVE

## 2019-10-15 ENCOUNTER — Ambulatory Visit (AMBULATORY_SURGERY_CENTER): Payer: Medicare Other | Admitting: Gastroenterology

## 2019-10-15 ENCOUNTER — Other Ambulatory Visit: Payer: Self-pay

## 2019-10-15 ENCOUNTER — Encounter: Payer: Self-pay | Admitting: Gastroenterology

## 2019-10-15 VITALS — BP 185/97 | HR 73 | Temp 97.8°F | Resp 20 | Ht 68.5 in | Wt 144.0 lb

## 2019-10-15 DIAGNOSIS — C182 Malignant neoplasm of ascending colon: Secondary | ICD-10-CM

## 2019-10-15 DIAGNOSIS — R948 Abnormal results of function studies of other organs and systems: Secondary | ICD-10-CM

## 2019-10-15 DIAGNOSIS — K5730 Diverticulosis of large intestine without perforation or abscess without bleeding: Secondary | ICD-10-CM | POA: Diagnosis not present

## 2019-10-15 DIAGNOSIS — K648 Other hemorrhoids: Secondary | ICD-10-CM

## 2019-10-15 DIAGNOSIS — R933 Abnormal findings on diagnostic imaging of other parts of digestive tract: Secondary | ICD-10-CM | POA: Diagnosis not present

## 2019-10-15 DIAGNOSIS — K6389 Other specified diseases of intestine: Secondary | ICD-10-CM

## 2019-10-15 MED ORDER — SODIUM CHLORIDE 0.9 % IV SOLN: 500.0000 mL | Freq: Once | INTRAVENOUS | Status: DC

## 2019-10-15 NOTE — Progress Notes (Signed)
Called to room to assist during endoscopic procedure.  Patient ID and intended procedure confirmed with present staff. Received instructions for my participation in the procedure from the performing physician.  

## 2019-10-15 NOTE — Progress Notes (Signed)
To PACU, VSS. Report to [email or web address] 

## 2019-10-15 NOTE — Op Note (Signed)
[place] Patient Name: Marissa Caldwell Procedure Date: 10/15/2019 11:45 AM MRN: [id number] Endoscopist: Mauri Pole , MD Age: 86 Referring MD:  Date of Birth: August 28, 1931 Gender: Female Account #: [id number] Procedure:                Colonoscopy Indications:              Abnormal PET scan of the GI tract Medicines:                Monitored Anesthesia Care Procedure:                Pre-Anesthesia Assessment:                           - Prior to the procedure, a History and Physical                            was performed, and patient medications and                            allergies were reviewed. The patient's tolerance of                            previous anesthesia was also reviewed. The risks                            and benefits of the procedure and the sedation                            options and risks were discussed with the patient.                            All questions were answered, and informed consent                            was obtained. Prior Anticoagulants: The patient has                            taken no previous anticoagulant or antiplatelet                            agents. ASA Grade Assessment: III - A patient with                            severe systemic disease. After reviewing the risks                            and benefits, the patient was deemed in                            satisfactory condition to undergo the procedure.                           After obtaining informed consent, the colonoscope  was passed under direct vision. Throughout the                            procedure, the patient's blood pressure, pulse, and                            oxygen saturations were monitored continuously. The                            Colonoscope was introduced through the anus and                            advanced to the the cecum, identified by                            appendiceal orifice and  ileocecal valve. The                            colonoscopy was performed without difficulty. The                            patient tolerated the procedure well. The quality                            of the bowel preparation was excellent. The                            ileocecal valve, appendiceal orifice, and rectum                            were photographed. Scope In: 11:49:44 AM Scope Out: 12:11:36 PM Scope Withdrawal Time: 0 hours 12 minutes 5 seconds  Total Procedure Duration: 0 hours 21 minutes 52 seconds  Findings:                 The perianal and digital rectal examinations were                            normal.                           An infiltrative non-obstructing large mass was                            found in the proximal ascending colon. The mass was                            non-circumferential. The mass measured one cm in                            length. In addition, its diameter measured                            twenty-one mm. No bleeding was present. Biopsies  were taken with a cold forceps for histology.                            Distal fold area was tattooed with an injection of                            1 mL of Spot (carbon black).                           Multiple small and large-mouthed diverticula were                            found in the sigmoid colon, descending colon,                            transverse colon and ascending colon.                           Non-bleeding internal hemorrhoids were found during                            retroflexion. The hemorrhoids were small. Complications:            No immediate complications. Estimated Blood Loss:     Estimated blood loss was minimal. 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 Biopsied. Tattooed.                           - Severe diverticulosis in the sigmoid colon, in                             the descending colon, in the transverse colon and                            in the ascending colon.                           - Non-bleeding internal hemorrhoids. Recommendation:           - Patient has a contact number available for                            emergencies. The signs and symptoms of potential                            delayed complications were discussed with the                            patient. Return to normal activities tomorrow.                            Written discharge instructions were  provided to the                            patient.                           - Resume previous diet.                           - Continue present medications.                           - Await pathology results.                           - No repeat colonoscopy due to age. Mauri Pole, MD 10/15/2019 12:18:37 PM This report has been signed electronically.

## 2019-10-15 NOTE — Progress Notes (Signed)
Temp LC  VS DT   Pt's states no medical or surgical changes since previsit or office visit.  Since 6:15 has been drinking 2 bottles of water with last water at 9:30.  Provider notified

## 2019-10-15 NOTE — Patient Instructions (Signed)
Information on polyps, hemorrhoids and diverticulosis given to you today.  Await pathology results.  YOU HAD AN ENDOSCOPIC PROCEDURE TODAY AT [facility]:   Refer to the procedure report that was given to you for any specific questions about what was found during the examination.  If the procedure report does not answer your questions, please call your gastroenterologist to clarify.  If you requested that your care partner not be given the details of your procedure findings, then the procedure report has been included in a sealed envelope for you to review at your convenience later.  YOU SHOULD EXPECT: Some feelings of bloating in the abdomen. Passage of more gas than usual.  Walking can help get rid of the air that was put into your GI tract during the procedure and reduce the bloating. If you had a lower endoscopy (such as a colonoscopy or flexible sigmoidoscopy) you may notice spotting of blood in your stool or on the toilet paper. If you underwent a bowel prep for your procedure, you may not have a normal bowel movement for a few days.  Please Note:  You might notice some irritation and congestion in your nose or some drainage.  This is from the oxygen used during your procedure.  There is no need for concern and it should clear up in a day or so.  SYMPTOMS TO REPORT IMMEDIATELY:   Following lower endoscopy (colonoscopy or flexible sigmoidoscopy):  Excessive amounts of blood in the stool  Significant tenderness or worsening of abdominal pains  Swelling of the abdomen that is new, acute  Fever of 100F or higher   For urgent or emergent issues, a gastroenterologist can be reached at any hour by calling [phone].   DIET:  We do recommend a small meal at first, but then you may proceed to your regular diet.  Drink plenty of fluids but you should avoid alcoholic beverages for 24 hours.  ACTIVITY:  You should plan to take it easy for the rest of today and you should NOT  DRIVE or use heavy machinery until tomorrow (because of the sedation medicines used during the test).    FOLLOW UP: Our staff will call the number listed on your records 48-72 hours following your procedure to check on you and address any questions or concerns that you may have regarding the information given to you following your procedure. If we do not reach you, we will leave a message.  We will attempt to reach you two times.  During this call, we will ask if you have developed any symptoms of COVID 19. If you develop any symptoms (ie: fever, flu-like symptoms, shortness of breath, cough etc.) before then, please call [phone].  If you test positive for Covid 19 in the 2 weeks post procedure, please call and report this information to [place].    If any biopsies were taken you will be contacted by phone or by letter within the next 1-3 weeks.  Please call us at [phone] if you have not heard about the biopsies in 3 weeks.    SIGNATURES/CONFIDENTIALITY: You and/or your care partner have signed paperwork which will be entered into your electronic medical record.  These signatures attest to the fact that that the information above on your After Visit Summary has been reviewed and is understood.  Full responsibility of the confidentiality of this discharge information lies with you and/or your care-partner.

## 2019-10-17 ENCOUNTER — Telehealth: Payer: Self-pay

## 2019-10-17 NOTE — Telephone Encounter (Signed)
Left message on follow up call. 

## 2019-10-17 NOTE — Telephone Encounter (Signed)
  Follow up Call-  Call back number 10/15/2019  Post procedure Call Back phone  # [phone]  Permission to leave phone message Yes  Some recent data might be hidden     Patient questions:  Do you have a fever, pain , or abdominal swelling? No. Pain Score  0 *  Have you tolerated food without any problems? Yes.    Have you been able to return to your normal activities? Yes.    Do you have any questions about your discharge instructions: Diet   No. Medications  No. Follow up visit  No.  Do you have questions or concerns about your Care? No.  Actions: * If pain score is 4 or above: No action needed, pain <4.    1. Have you developed a fever since your procedure? No  2.   Have you had an respiratory symptoms (SOB or cough) since your procedure? No  3.   Have you tested positive for COVID 19 since your procedure No  4.   Have you had any family members/close contacts diagnosed with the COVID 19 since your procedure?  No   If yes to any of these questions please route to Joylene John, RN and Alphonsa Gin, RN.

## 2019-10-30 ENCOUNTER — Ambulatory Visit: Payer: Self-pay | Admitting: General Surgery

## 2019-10-30 DIAGNOSIS — C50411 Malignant neoplasm of upper-outer quadrant of right female breast: Secondary | ICD-10-CM | POA: Diagnosis not present

## 2019-10-30 DIAGNOSIS — C182 Malignant neoplasm of ascending colon: Secondary | ICD-10-CM | POA: Diagnosis not present

## 2019-10-30 DIAGNOSIS — Z170 Estrogen receptor positive status [ER+]: Secondary | ICD-10-CM | POA: Diagnosis not present

## 2019-11-03 ENCOUNTER — Telehealth: Payer: Self-pay

## 2019-11-03 NOTE — Telephone Encounter (Signed)
-----   Message from [name], MD sent at 10/30/2019  5:10 PM EST ----- Hi PJ,  Sure, if you feel she is candidate for hemicolectomy, please proceed as you planned.  Santiago Glad, please let pt know that I am aware of her recent colon cancer diagnosis and plan for surgery, let her stop Tamoxifen now if her surgery will be within the next month (if not, stop one month before surgery), and I will still see her 3-4 weeks after her surgery. Please hold on her PET scan for now (initially planned before her next visit), and adjust her appointments if needed.  Thanks  Krista Blue  ----- Message ----- From: Jovita Kussmaul, MD Sent: 10/30/2019   4:19 PM EST To: [name], MD  This lady is a breast cancer pt of ours and has a new small right colon cancer that is assymptomatic. My thought is that if she can tolerate surgery it would be best to get it out. If you agree I will plan for a right colectomy. Thanks. PJ

## 2019-11-03 NOTE — Telephone Encounter (Signed)
I spoke with Marissa Caldwell.  I provided her instructions to stop Tamoxifen 1 month prior to surgery.  She stated the surgery has not been scheduled yet.  I asked that she call and let us know the date.  It old her that Dr. Burr Medico will see her 3-4 weeks after surgery.  She verbalized understanding.

## 2019-11-20 DIAGNOSIS — Z961 Presence of intraocular lens: Secondary | ICD-10-CM | POA: Diagnosis not present

## 2019-11-20 DIAGNOSIS — H353211 Exudative age-related macular degeneration, right eye, with active choroidal neovascularization: Secondary | ICD-10-CM | POA: Diagnosis not present

## 2019-11-20 DIAGNOSIS — H353122 Nonexudative age-related macular degeneration, left eye, intermediate dry stage: Secondary | ICD-10-CM | POA: Diagnosis not present

## 2019-11-20 DIAGNOSIS — C189 Malignant neoplasm of colon, unspecified: Secondary | ICD-10-CM

## 2019-11-20 DIAGNOSIS — H401331 Pigmentary glaucoma, bilateral, mild stage: Secondary | ICD-10-CM | POA: Diagnosis not present

## 2019-11-20 HISTORY — DX: Malignant neoplasm of colon, unspecified: C18.9

## 2019-11-25 ENCOUNTER — Telehealth: Payer: Self-pay | Admitting: Hematology

## 2019-11-25 NOTE — Telephone Encounter (Signed)
Scheduled apt per 4/5 sch message - unable to reach pt - left message with apt date and time  Mailed letter

## 2019-12-03 DIAGNOSIS — E559 Vitamin D deficiency, unspecified: Secondary | ICD-10-CM | POA: Diagnosis not present

## 2019-12-03 DIAGNOSIS — I10 Essential (primary) hypertension: Secondary | ICD-10-CM | POA: Diagnosis not present

## 2019-12-03 DIAGNOSIS — E039 Hypothyroidism, unspecified: Secondary | ICD-10-CM | POA: Diagnosis not present

## 2019-12-03 DIAGNOSIS — J449 Chronic obstructive pulmonary disease, unspecified: Secondary | ICD-10-CM | POA: Diagnosis not present

## 2019-12-08 ENCOUNTER — Other Ambulatory Visit: Payer: Medicare Other

## 2019-12-10 ENCOUNTER — Ambulatory Visit: Payer: Medicare Other | Admitting: Hematology

## 2019-12-10 NOTE — Progress Notes (Signed)
[place], [place] [address] Phone: [phone] Fax: [phone]  [facility] PRIME [phone], [place] [facility] AT [facility] [place] [address] Phone: [phone] Fax: [phone]  [facility] SERVICE) [place], [place] [address] Phone: [phone] Fax: [phone]    Your procedure is scheduled on Monday, April 26th.  Report to [facility] Main Entrance "A" at 6:30 A.M., and check in at the Admitting office.  Call this number if you have problems the morning of surgery:  [phone]  Call [phone] if you have any questions prior to your surgery date Monday-Friday 8am-4pm   Remember:  Do not eat after midnight the night before your surgery  You may drink clear liquids until 5:30 the morning of your surgery.   Clear liquids allowed are: Water, Non-Citrus Juices (without pulp), Carbonated Beverages, Clear Tea, Black Coffee Only, and Gatorade  -PRE-SURGERY ENSURE INSTRUCTIONS Drink one Pre-Surgery  Ensure drink at dinner time the night before surgery Drink one Pre-Surgery Ensure drink at bedtime the night before surgery Drink one Pre-Surgery Ensure drink by 5:30 A.M. the morning  of surgery   Take these medicines the morning of surgery with A SIP OF WATER  brimonidine (ALPHAGAN)/eye drops budesonide-formoterol (SYMBICORT)  carvedilol (COREG)  levothyroxine (SYNTHROID, LEVOTHROID) pravastatin (PRAVACHOL) tamoxifen (NOLVADEX) TOVIAZ    If needed - albuterol (PROVENTIL HFA;VENTOLIN HFA), HYDROcodone-acetaminophen (NORCO/VICODIN)  Follow your surgeon's instructions on when to stop Aspirin.  If no instructions were given by your surgeon then you will need to call the office to get those instructions.     As of today, STOP taking any Aspirin  (unless otherwise instructed by your surgeon) and Aspirin containing products, Aleve, Naproxen, Ibuprofen, Motrin, Advil, Goody's, BC's, all herbal medications, fish oil, and all vitamins.                     Do not wear jewelry, make up, or nail polish            Do not wear lotions, powders, perfumes, or deodorant.            Do not shave 48 hours prior to surgery.              Do not bring valuables to the hospital.            [facility] is not responsible for any belongings or valuables.  Do NOT Smoke (Tobacco/Vapping) or drink Alcohol 24 hours prior to your procedure If you use a CPAP at night, you may bring all equipment for your overnight stay.   Contacts, glasses, dentures or bridgework may not be worn into surgery.      For patients admitted to the hospital, discharge time will be determined by your treatment team.   Patients discharged the day of surgery will not be allowed to drive home, and someone needs to stay with them for 24 hours.  Special instructions:   [place]- Preparing For Surgery  Before surgery, you can play an important role. Because skin is not sterile, your skin needs to be as free of germs as possible. You can reduce the number of germs on your skin by washing with CHG (chlorahexidine gluconate) Soap before surgery.  CHG is an antiseptic cleaner which kills germs and bonds with the skin  to continue killing germs even after washing.    Oral Hygiene is also important to reduce your risk of infection.  Remember - BRUSH YOUR TEETH THE MORNING OF SURGERY WITH YOUR REGULAR TOOTHPASTE  Please do not use if you have an allergy to CHG or antibacterial soaps. If your skin becomes reddened/irritated stop using the CHG.  Do not shave (including legs and underarms) for at least 48 hours prior to first CHG shower. It is OK to shave your face.  Please follow these instructions carefully.   1. Shower the NIGHT BEFORE SURGERY and the MORNING OF SURGERY with CHG Soap.    2. If you chose to wash your hair, wash your hair first as usual with your normal shampoo.  3. After you shampoo, rinse your hair and body thoroughly to remove the shampoo.  4. Use CHG as you would any other liquid soap. You can apply CHG directly to the skin and wash gently with a scrungie or a clean washcloth.   5. Apply the CHG Soap to your body ONLY FROM THE NECK DOWN.  Do not use on open wounds or open sores. Avoid contact with your eyes, ears, mouth and genitals (private parts). Wash Face and genitals (private parts)  with your normal soap.   6. Wash thoroughly, paying special attention to the area where your surgery will be performed.  7. Thoroughly rinse your body with warm water from the neck down.  8. DO NOT shower/wash with your normal soap after using and rinsing off the CHG Soap.  9. Pat yourself dry with a CLEAN TOWEL.  10. Wear CLEAN PAJAMAS to bed the night before surgery, wear comfortable clothes the morning of surgery  11. Place CLEAN SHEETS on your bed the night of your first shower and DO NOT SLEEP WITH PETS.  Day of Surgery: Shower with CHG soap as instructed above. Do not apply any deodorants/lotions.  Please wear clean clothes to the hospital/surgery center.   Remember to brush your teeth WITH YOUR REGULAR TOOTHPASTE.   Please read over the following fact sheets that you were given.

## 2019-12-11 ENCOUNTER — Encounter (HOSPITAL_COMMUNITY): Payer: Self-pay | Admitting: Physician Assistant

## 2019-12-11 ENCOUNTER — Other Ambulatory Visit: Payer: Self-pay

## 2019-12-11 ENCOUNTER — Other Ambulatory Visit (HOSPITAL_COMMUNITY)
Admission: RE | Admit: 2019-12-11 | Discharge: 2019-12-11 | Disposition: A | Discharge status: Home / Self Care | Payer: Medicare Other | Source: Ambulatory Visit | Attending: General Surgery | Admitting: General Surgery

## 2019-12-11 ENCOUNTER — Encounter (HOSPITAL_COMMUNITY): Payer: Self-pay

## 2019-12-11 ENCOUNTER — Encounter (HOSPITAL_COMMUNITY)
Admission: RE | Admit: 2019-12-11 | Discharge: 2019-12-11 | Disposition: A | Discharge status: Home / Self Care | Payer: Medicare Other | Source: Ambulatory Visit | Attending: General Surgery | Admitting: General Surgery

## 2019-12-11 DIAGNOSIS — Z20822 Contact with and (suspected) exposure to covid-19: Secondary | ICD-10-CM | POA: Insufficient documentation

## 2019-12-11 DIAGNOSIS — Z01812 Encounter for preprocedural laboratory examination: Secondary | ICD-10-CM | POA: Diagnosis not present

## 2019-12-11 LAB — BASIC METABOLIC PANEL
Anion gap: 8 (ref 5–15)
BUN: 23 mg/dL (ref 8–23)
CO2: 27 mmol/L (ref 22–32)
Calcium: 9.3 mg/dL (ref 8.9–10.3)
Chloride: 103 mmol/L (ref 98–111)
Creatinine, Ser: 0.78 mg/dL (ref 0.44–1.00)
GFR calc Af Amer: >60 mL/min (ref >60)
GFR calc non Af Amer: >60 mL/min (ref >60)
Glucose, Bld: 110 mg/dL — ABNORMAL HIGH (ref 70–99)
Potassium: 4.1 mmol/L (ref 3.5–5.1)
Sodium: 138 mmol/L (ref 135–145)

## 2019-12-11 LAB — CBC
HCT: 37.6 % (ref 36.0–46.0)
Hemoglobin: 12.2 g/dL (ref 12.0–15.0)
MCH: 31.3 pg (ref 26.0–34.0)
MCHC: 32.4 g/dL (ref 30.0–36.0)
MCV: 96.4 fL (ref 80.0–100.0)
Platelets: 236 10*3/uL (ref 150–400)
RBC: 3.9 MIL/uL (ref 3.87–5.11)
RDW: 13.3 % (ref 11.5–15.5)
WBC: 5.6 10*3/uL (ref 4.0–10.5)
nRBC: 0 % (ref 0.0–0.2)

## 2019-12-11 LAB — TYPE AND SCREEN
ABO/RH(D): O POS
Antibody Screen: NEGATIVE

## 2019-12-11 LAB — SARS CORONAVIRUS 2 (TAT 6-24 HRS): SARS Coronavirus 2: NEGATIVE

## 2019-12-11 LAB — ABO/RH: ABO/RH(D): O POS

## 2019-12-11 LAB — HEMOGLOBIN A1C
Hgb A1c MFr Bld: 5.8 % — ABNORMAL HIGH (ref 4.8–5.6)
Mean Plasma Glucose: 119.76 mg/dL

## 2019-12-11 NOTE — Progress Notes (Signed)
[place], [place] [address] Phone: [phone] Fax: [phone]  [facility] PRIME [phone], [place] [facility] AT [facility] [place] [address] Phone: [phone] Fax: [phone]  [facility] SERVICE) [place], [place] [address] Phone: [phone] Fax: [phone]    Your procedure is scheduled on Monday, April 26th.  Report to [facility] Main Entrance "A" at 6:30 A.M., and check in at the Admitting office.  Call this number if you have problems the morning of surgery:  [phone]  Call [phone] if you have any questions prior to your surgery date Monday-Friday 8am-4pm   Remember:  Do not eat after midnight the night before your surgery  You may drink clear liquids until 5:30 the morning of your surgery.   Clear liquids allowed are: Water, Non-Citrus Juices (without pulp), Carbonated Beverages, Clear Tea, Black Coffee Only, and Gatorade  -PRE-SURGERY ENSURE INSTRUCTIONS Drink one Pre-Surgery  Ensure drink at dinner time the night before surgery Drink one Pre-Surgery Ensure drink at bedtime the night before surgery Drink one Pre-Surgery Ensure drink by 5:30 A.M. the morning  of surgery   Take these medicines the morning of surgery with A SIP OF WATER  brimonidine (ALPHAGAN)/eye drops budesonide-formoterol (SYMBICORT)  levothyroxine (SYNTHROID, LEVOTHROID) pravastatin (PRAVACHOL) TOVIAZ    If needed - albuterol (PROVENTIL HFA;VENTOLIN HFA)  Follow your surgeon's instructions on when to stop Aspirin.  If no instructions were given by your surgeon then you will need to call the office to get those instructions.     As of today, STOP taking any Aspirin (unless otherwise instructed by your surgeon) and Aspirin containing products, Aleve,  Naproxen, Ibuprofen, Motrin, Advil, Goody's, BC's, all herbal medications, fish oil, and all vitamins.                     Do not wear jewelry, make up, or nail polish            Do not wear lotions, powders, perfumes, or deodorant.            Do not shave 48 hours prior to surgery.              Do not bring valuables to the hospital.            [facility] is not responsible for any belongings or valuables.  Do NOT Smoke (Tobacco/Vapping) or drink Alcohol 24 hours prior to your procedure If you use a CPAP at night, you may bring all equipment for your overnight stay.   Contacts, glasses, dentures or bridgework may not be worn into surgery.      For patients admitted to the hospital, discharge time will be determined by your treatment team.   Patients discharged the day of surgery will not be allowed to drive home, and someone needs to stay with them for 24 hours.  Special instructions:   [place]- Preparing For Surgery  Before surgery, you can play an important role. Because skin is not sterile, your skin needs to be as free of germs as possible. You can reduce the number of germs on your skin by washing with CHG (chlorahexidine gluconate) Soap before surgery.  CHG is an antiseptic cleaner which kills germs and bonds with the skin to continue killing germs even after washing.  Oral Hygiene is also important to reduce your risk of infection.  Remember - BRUSH YOUR TEETH THE MORNING OF SURGERY WITH YOUR REGULAR TOOTHPASTE  Please do not use if you have an allergy to CHG or antibacterial soaps. If your skin becomes reddened/irritated stop using the CHG.  Do not shave (including legs and underarms) for at least 48 hours prior to first CHG shower. It is OK to shave your face.  Please follow these instructions carefully.   1. Shower the NIGHT BEFORE SURGERY and the MORNING OF SURGERY with CHG Soap.   2. If you chose to wash your hair, wash your hair first as usual with your normal  shampoo.  3. After you shampoo, rinse your hair and body thoroughly to remove the shampoo.  4. Use CHG as you would any other liquid soap. You can apply CHG directly to the skin and wash gently with a scrungie or a clean washcloth.   5. Apply the CHG Soap to your body ONLY FROM THE NECK DOWN.  Do not use on open wounds or open sores. Avoid contact with your eyes, ears, mouth and genitals (private parts). Wash Face and genitals (private parts)  with your normal soap.   6. Wash thoroughly, paying special attention to the area where your surgery will be performed.  7. Thoroughly rinse your body with warm water from the neck down.  8. DO NOT shower/wash with your normal soap after using and rinsing off the CHG Soap.  9. Pat yourself dry with a CLEAN TOWEL.  10. Wear CLEAN PAJAMAS to bed the night before surgery, wear comfortable clothes the morning of surgery  11. Place CLEAN SHEETS on your bed the night of your first shower and DO NOT SLEEP WITH PETS.  Day of Surgery: Shower with CHG soap as instructed above. Do not apply any deodorants/lotions.  Please wear clean clothes to the hospital/surgery center.   Remember to brush your teeth WITH YOUR REGULAR TOOTHPASTE.   Please read over the following fact sheets that you were given.

## 2019-12-11 NOTE — Progress Notes (Signed)
PCP - Dr. [name] Oncologist: Dr. [name] Cardiologist - Dr. Skeet Latch  PPM/ICD - Denies  Chest x-ray - N/A EKG - 02/27/19 Stress Test - 05/08/19 ECHO - 03/15/17 Cardiac Cath - 03/26/17  Sleep Study - Denies  Pt denies being diabetic.  Blood Thinner Instructions: N/A Aspirin Instructions: LD: 12/10/19  ERAS Protcol - Yes, 3 PRE-SURGERY Ensure drinks  Pt was given bowel prep instructions by surgeon's office.  COVID TEST- 12/11/19   Coronavirus Screening  Have you experienced the following symptoms:  Cough yes/no: No Fever (>100.37F)  yes/no: No Runny nose yes/no: No Sore throat yes/no: No Difficulty breathing/shortness of breath  yes/no: No  Have you or a family member traveled in the last 14 days and where? yes/no: No   If the patient indicates "YES" to the above questions, their PAT will be rescheduled to limit the exposure to others and, the surgeon will be notified. THE PATIENT WILL NEED TO BE ASYMPTOMATIC FOR 14 DAYS.   If the patient is not experiencing any of these symptoms, the PAT nurse will instruct them to NOT bring anyone with them to their appointment since they may have these symptoms or traveled as well.   Please remind your patients and families that hospital visitation restrictions are in effect and the importance of the restrictions.     Anesthesia review: Yes, cardiac hx  Patient denies shortness of breath, fever, cough and chest pain at PAT appointment   All instructions explained to the patient, with a verbal understanding of the material. Patient agrees to go over the instructions while at home for a better understanding. Patient also instructed to self quarantine after being tested for COVID-19. The opportunity to ask questions was provided.

## 2019-12-12 LAB — CEA: CEA: 6.6 ng/mL — ABNORMAL HIGH (ref 0.0–4.7)

## 2019-12-12 NOTE — Progress Notes (Signed)
Anesthesia Chart Review:  Follows with cardiology for a hx ofCAD, status post distal circumflex PCI with DES August 2018 after an abnormal Myoview. She was seen 04/21/19 by Kerin Ransom, PA-C for preop clearance prior to lumpectomy. He ordered nuclear stress which was done 05/08/19 and showed no ischemia, LVEF 67% with normal wall motion, low risk. She was cleared for surgery and had it 0000000 without complication.   Preop labs reviewed, unremarkable.   EKG 02/27/19: NSR. Rate 70.  Nuclear stress 05/08/19:  Nuclear stress EF: 67%.  No T wave inversion was noted during stress.  There was no ST segment deviation noted during stress.  This is a low risk study.   No reversible ischemia. LVEF 67% with normal wall motion. This is a low risk study.  Cath and PCI 03/26/17:   Dominant circumflex with distal 95% stenosis beyond which an inferolateral wall obtuse marginal and the PDA arise.   Otherwise widely patent coronary arteries with marked tortuosity and angulation. Right coronary is nondominant.   Successful DES implantation reducing the 95% stenosis to 0% with TIMI grade 3 flow using a 3.0 x 15 mm study stent.   Chronic diastolic heart failure with LVEDP of 20 mmHg and normal overall systolic function with EF 60%.    RECOMMENDATIONS:     Plan discharge in a.m. on aspirin and Plavix.   Plavix should be continued for at least 6 months.   Risk factor modification.   [facility] Short Stay Center/Anesthesiology Phone [phone] 12/12/2019 9:20 AM

## 2019-12-12 NOTE — Anesthesia Preprocedure Evaluation (Deleted)
Anesthesia Evaluation    Airway        Dental   Pulmonary Current Smoker,           Cardiovascular hypertension,      Neuro/Psych    GI/Hepatic   Endo/Other    Renal/GU      Musculoskeletal   Abdominal   Peds  Hematology   Anesthesia Other Findings   Reproductive/Obstetrics                             Anesthesia Physical Anesthesia Plan  ASA:   Anesthesia Plan:    Post-op Pain Management:    Induction:   PONV Risk Score and Plan:   Airway Management Planned:   Additional Equipment:   Intra-op Plan:   Post-operative Plan:   Informed Consent:   Plan Discussed with:   Anesthesia Plan Comments: (Follows with cardiology for a hx ofCAD, status post distal circumflex PCI with DES August 2018 after an abnormal Myoview. She was seen 04/21/19 by Kerin Ransom, PA-C for preop clearance prior to lumpectomy. He ordered nuclear stress which was done 05/08/19 and showed no ischemia, LVEF 67% with normal wall motion, low risk. She was cleared for surgery and had it 0000000 without complication.   Preop labs reviewed, unremarkable.   EKG 02/27/19: NSR. Rate 70.  Nuclear stress 05/08/19: Nuclear stress EF: 67%. No T wave inversion was noted during stress. There was no ST segment deviation noted during stress. This is a low risk study.   No reversible ischemia. LVEF 67% with normal wall motion. This is a low risk study.  Cath and PCI 03/26/17:  Dominant circumflex with distal 95% stenosis beyond which an inferolateral wall obtuse marginal and the PDA arise.  Otherwise widely patent coronary arteries with marked tortuosity and angulation. Right coronary is nondominant.  Successful DES implantation reducing the 95% stenosis to 0% with TIMI grade 3 flow using a 3.0 x 15 mm study stent.  Chronic diastolic heart failure with LVEDP of 20 mmHg and normal overall systolic function with EF  60%.  RECOMMENDATIONS:   Plan discharge in a.m. on aspirin and Plavix.  Plavix should be continued for at least 6 months.  Risk factor modification. )        Anesthesia Quick Evaluation

## 2019-12-13 ENCOUNTER — Encounter (HOSPITAL_COMMUNITY): Payer: Self-pay | Admitting: General Surgery

## 2019-12-14 NOTE — Anesthesia Preprocedure Evaluation (Addendum)
Anesthesia Evaluation  Patient identified by MRN, date of birth, ID band Patient awake    Reviewed: Allergy & Precautions, NPO status , Patient's Chart, lab work & pertinent test results, reviewed documented beta blocker date and time   History of Anesthesia Complications Negative for: history of anesthetic complications  Airway Mallampati: II  TM Distance: >3 FB Neck ROM: Full    Dental  (+) Upper Dentures, Lower Dentures, Dental Advisory Given   Pulmonary COPD,  COPD inhaler, Current Smoker,    Pulmonary exam normal        Cardiovascular hypertension, Pt. on medications + CAD and + Cardiac Stents (2018)  Normal cardiovascular exam  Follows with cardiology for a hx ofCAD, status post distal circumflex PCI with DES August 2018 after an abnormal Myoview. She was seen 04/21/19 by Kerin Ransom, PA-C for preop clearance prior to lumpectomy. He ordered nuclear stress which was done 05/08/19 and showed no ischemia, LVEF 67% with normal wall motion, low risk. She was cleared for surgery and had it 0000000 without complication.   Preop labs reviewed, unremarkable.   EKG 02/27/19: NSR. Rate 70.  Nuclear stress 05/08/19: Nuclear stress EF: 67%. No T wave inversion was noted during stress. There was no ST segment deviation noted during stress. This is a low risk study.   No reversible ischemia. LVEF 67% with normal wall motion. This is a low risk study.  Cath and PCI 03/26/17:  Dominant circumflex with distal 95% stenosis beyond which an inferolateral wall obtuse marginal and the PDA arise.  Otherwise widely patent coronary arteries with marked tortuosity and angulation. Right coronary is nondominant.  Successful DES implantation reducing the 95% stenosis to 0% with TIMI grade 3 flow using a 3.0 x 15 mm study stent.  Chronic diastolic heart failure with LVEDP of 20 mmHg and normal overall systolic function with EF  60%.  RECOMMENDATIONS:   Plan discharge in a.m. on aspirin and Plavix.  Plavix should be continued for at least 6 months.  Risk factor modification.    Neuro/Psych Anxiety negative neurological ROS  negative psych ROS   GI/Hepatic Neg liver ROS, GERD  Medicated,  Endo/Other  Hypothyroidism   Renal/GU negative Renal ROS  negative genitourinary   Musculoskeletal  (+) Arthritis ,   Abdominal   Peds  Hematology negative hematology ROS (+)   Anesthesia Other Findings Day of surgery medications reviewed with patient.  Reproductive/Obstetrics negative OB ROS                            Anesthesia Physical  Anesthesia Plan  ASA: III  Anesthesia Plan: General   Post-op Pain Management:    Induction: Intravenous  PONV Risk Score and Plan: Treatment may vary due to age or medical condition, Ondansetron, Dexamethasone and Diphenhydramine  Airway Management Planned: Oral ETT  Additional Equipment: None  Intra-op Plan:   Post-operative Plan: Extubation in OR  Informed Consent: I have reviewed the patients History and Physical, chart, labs and discussed the procedure including the risks, benefits and alternatives for the proposed anesthesia with the patient or authorized representative who has indicated his/her understanding and acceptance.     Dental advisory given  Plan Discussed with: CRNA and Anesthesiologist  Anesthesia Plan Comments:        Anesthesia Quick Evaluation

## 2019-12-15 ENCOUNTER — Other Ambulatory Visit: Payer: Self-pay

## 2019-12-15 ENCOUNTER — Inpatient Hospital Stay (HOSPITAL_COMMUNITY): Payer: Medicare Other | Admitting: Anesthesiology

## 2019-12-15 ENCOUNTER — Encounter (HOSPITAL_COMMUNITY): Payer: Self-pay | Admitting: General Surgery

## 2019-12-15 ENCOUNTER — Encounter (HOSPITAL_COMMUNITY)
Admission: RE | Disposition: A | Discharge status: Home / Self Care | Payer: Medicare Other | Source: Home / Self Care | Attending: General Surgery

## 2019-12-15 ENCOUNTER — Inpatient Hospital Stay (HOSPITAL_COMMUNITY)
Admission: RE | Admit: 2019-12-15 | Discharge: 2019-12-22 | DRG: 330 | Disposition: A | Discharge status: Home / Self Care | Payer: Medicare Other | Attending: General Surgery | Admitting: General Surgery

## 2019-12-15 ENCOUNTER — Encounter (HOSPITAL_COMMUNITY)
Admission: RE | Disposition: A | Discharge status: Home / Self Care | Payer: Self-pay | Source: Home / Self Care | Attending: General Surgery

## 2019-12-15 DIAGNOSIS — J449 Chronic obstructive pulmonary disease, unspecified: Secondary | ICD-10-CM | POA: Diagnosis not present

## 2019-12-15 DIAGNOSIS — Z170 Estrogen receptor positive status [ER+]: Secondary | ICD-10-CM | POA: Diagnosis not present

## 2019-12-15 DIAGNOSIS — Z7982 Long term (current) use of aspirin: Secondary | ICD-10-CM | POA: Diagnosis not present

## 2019-12-15 DIAGNOSIS — E440 Moderate protein-calorie malnutrition: Secondary | ICD-10-CM | POA: Diagnosis not present

## 2019-12-15 DIAGNOSIS — Z7951 Long term (current) use of inhaled steroids: Secondary | ICD-10-CM

## 2019-12-15 DIAGNOSIS — E039 Hypothyroidism, unspecified: Secondary | ICD-10-CM | POA: Diagnosis present

## 2019-12-15 DIAGNOSIS — Z888 Allergy status to other drugs, medicaments and biological substances status: Secondary | ICD-10-CM | POA: Diagnosis not present

## 2019-12-15 DIAGNOSIS — I5032 Chronic diastolic (congestive) heart failure: Secondary | ICD-10-CM | POA: Diagnosis not present

## 2019-12-15 DIAGNOSIS — H9190 Unspecified hearing loss, unspecified ear: Secondary | ICD-10-CM | POA: Diagnosis not present

## 2019-12-15 DIAGNOSIS — Z79899 Other long term (current) drug therapy: Secondary | ICD-10-CM

## 2019-12-15 DIAGNOSIS — E785 Hyperlipidemia, unspecified: Secondary | ICD-10-CM | POA: Diagnosis not present

## 2019-12-15 DIAGNOSIS — C50411 Malignant neoplasm of upper-outer quadrant of right female breast: Secondary | ICD-10-CM

## 2019-12-15 DIAGNOSIS — I110 Hypertensive heart disease with heart failure: Secondary | ICD-10-CM | POA: Diagnosis not present

## 2019-12-15 DIAGNOSIS — C182 Malignant neoplasm of ascending colon: Principal | ICD-10-CM | POA: Diagnosis present

## 2019-12-15 HISTORY — PX: LAPAROSCOPIC RIGHT COLECTOMY: SHX5925

## 2019-12-15 LAB — GLUCOSE, CAPILLARY: Glucose-Capillary: 138 mg/dL — ABNORMAL HIGH (ref 70–99)

## 2019-12-15 SURGERY — COLECTOMY, RIGHT, LAPAROSCOPIC
Anesthesia: General | Site: Abdomen | Laterality: Right

## 2019-12-15 SURGERY — COLECTOMY, RIGHT, LAPAROSCOPIC
Anesthesia: General | Laterality: Right

## 2019-12-15 MED ORDER — ONDANSETRON HCL 4 MG PO TABS
4.0000 mg | ORAL_TABLET | Freq: Four times a day (QID) | ORAL | Status: DC | PRN
  Administered 2019-12-16: 4 mg via ORAL
  Filled 2019-12-15: qty 1

## 2019-12-15 MED ORDER — ONDANSETRON HCL 4 MG/2ML IJ SOLN
INTRAMUSCULAR | Status: AC
  Filled 2019-12-15: qty 2

## 2019-12-15 MED ORDER — KCL IN DEXTROSE-NACL 20-5-0.9 MEQ/L-%-% IV SOLN
INTRAVENOUS | Status: DC
  Filled 2019-12-15 (×3): qty 1000

## 2019-12-15 MED ORDER — METRONIDAZOLE 500 MG PO TABS: 1000.0000 mg | ORAL_TABLET | ORAL | Status: DC

## 2019-12-15 MED ORDER — BUPIVACAINE HCL (PF) 0.25 % IJ SOLN
INTRAMUSCULAR | Status: AC
  Filled 2019-12-15: qty 30

## 2019-12-15 MED ORDER — PHENYLEPHRINE HCL-NACL 10-0.9 MG/250ML-% IV SOLN
INTRAVENOUS | Status: DC | PRN
  Administered 2019-12-15: 25 ug/min via INTRAVENOUS

## 2019-12-15 MED ORDER — SODIUM CHLORIDE 0.9 % IR SOLN
Status: DC | PRN
  Administered 2019-12-15: 1000 mL

## 2019-12-15 MED ORDER — FESOTERODINE FUMARATE ER 4 MG PO TB24
4.0000 mg | ORAL_TABLET | Freq: Every day | ORAL | Status: DC
  Administered 2019-12-15 – 2019-12-22 (×8): 4 mg via ORAL
  Filled 2019-12-15 (×8): qty 1

## 2019-12-15 MED ORDER — CHLORHEXIDINE GLUCONATE CLOTH 2 % EX PADS: 6.0000 | MEDICATED_PAD | Freq: Once | CUTANEOUS | Status: DC

## 2019-12-15 MED ORDER — FENTANYL CITRATE (PF) 250 MCG/5ML IJ SOLN
INTRAMUSCULAR | Status: DC | PRN
  Administered 2019-12-15 (×5): 50 ug via INTRAVENOUS

## 2019-12-15 MED ORDER — DEXAMETHASONE SODIUM PHOSPHATE 10 MG/ML IJ SOLN
INTRAMUSCULAR | Status: DC | PRN
  Administered 2019-12-15: 5 mg via INTRAVENOUS

## 2019-12-15 MED ORDER — BRIMONIDINE TARTRATE 0.2 % OP SOLN
1.0000 [drp] | Freq: Two times a day (BID) | OPHTHALMIC | Status: DC
  Administered 2019-12-15 – 2019-12-22 (×13): 1 [drp] via OPHTHALMIC
  Filled 2019-12-15 (×5): qty 5

## 2019-12-15 MED ORDER — SODIUM CHLORIDE 0.9 % IV SOLN
2.0000 g | INTRAVENOUS | Status: AC
  Administered 2019-12-15: 2 g via INTRAVENOUS
  Filled 2019-12-15: qty 2

## 2019-12-15 MED ORDER — PRAVASTATIN SODIUM 40 MG PO TABS
40.0000 mg | ORAL_TABLET | Freq: Every day | ORAL | Status: DC
  Administered 2019-12-15 – 2019-12-22 (×8): 40 mg via ORAL
  Filled 2019-12-15 (×8): qty 1

## 2019-12-15 MED ORDER — ALBUMIN HUMAN 5 % IV SOLN
INTRAVENOUS | Status: DC | PRN
Start: 2019-12-15 — End: 2019-12-15

## 2019-12-15 MED ORDER — ENSURE SURGERY PO LIQD
237.0000 mL | Freq: Two times a day (BID) | ORAL | Status: DC
  Administered 2019-12-16 – 2019-12-18 (×5): 237 mL via ORAL
  Filled 2019-12-15 (×7): qty 237

## 2019-12-15 MED ORDER — LACTATED RINGERS IV SOLN: INTRAVENOUS | Status: DC

## 2019-12-15 MED ORDER — ALBUTEROL SULFATE (2.5 MG/3ML) 0.083% IN NEBU
2.5000 mg | INHALATION_SOLUTION | Freq: Four times a day (QID) | RESPIRATORY_TRACT | Status: DC | PRN
  Administered 2019-12-17: 2.5 mg via RESPIRATORY_TRACT
  Filled 2019-12-15: qty 3

## 2019-12-15 MED ORDER — 0.9 % SODIUM CHLORIDE (POUR BTL) OPTIME
TOPICAL | Status: DC | PRN
  Administered 2019-12-15: 1000 mL

## 2019-12-15 MED ORDER — SUGAMMADEX SODIUM 200 MG/2ML IV SOLN
INTRAVENOUS | Status: DC | PRN
  Administered 2019-12-15: 131.6 mg via INTRAVENOUS

## 2019-12-15 MED ORDER — ONDANSETRON HCL 4 MG/2ML IJ SOLN
4.0000 mg | Freq: Four times a day (QID) | INTRAMUSCULAR | Status: DC | PRN
  Administered 2019-12-18: 4 mg via INTRAVENOUS
  Filled 2019-12-15 (×2): qty 2

## 2019-12-15 MED ORDER — ALVIMOPAN 12 MG PO CAPS: 12.0000 mg | ORAL_CAPSULE | ORAL | Status: DC

## 2019-12-15 MED ORDER — METOPROLOL TARTRATE 5 MG/5ML IV SOLN
INTRAVENOUS | Status: DC | PRN
  Administered 2019-12-15 (×2): 2 mg via INTRAVENOUS

## 2019-12-15 MED ORDER — DEXAMETHASONE SODIUM PHOSPHATE 10 MG/ML IJ SOLN
INTRAMUSCULAR | Status: AC
  Filled 2019-12-15: qty 1

## 2019-12-15 MED ORDER — LACTATED RINGERS IV SOLN: INTRAVENOUS | Status: DC | PRN

## 2019-12-15 MED ORDER — ACETAMINOPHEN 500 MG PO TABS
1000.0000 mg | ORAL_TABLET | Freq: Once | ORAL | Status: AC
  Administered 2019-12-15: 1000 mg via ORAL
  Filled 2019-12-15: qty 2

## 2019-12-15 MED ORDER — LIDOCAINE 2% (20 MG/ML) 5 ML SYRINGE
INTRAMUSCULAR | Status: AC
  Filled 2019-12-15: qty 5

## 2019-12-15 MED ORDER — METOPROLOL TARTRATE 5 MG/5ML IV SOLN
INTRAVENOUS | Status: AC
  Filled 2019-12-15: qty 5

## 2019-12-15 MED ORDER — POVIDONE-IODINE 10 % EX OINT
TOPICAL_OINTMENT | CUTANEOUS | Status: AC
  Filled 2019-12-15: qty 28.35

## 2019-12-15 MED ORDER — FENTANYL CITRATE (PF) 100 MCG/2ML IJ SOLN
INTRAMUSCULAR | Status: AC
  Filled 2019-12-15: qty 2

## 2019-12-15 MED ORDER — DIPHENHYDRAMINE HCL 50 MG/ML IJ SOLN
INTRAMUSCULAR | Status: AC
  Filled 2019-12-15: qty 1

## 2019-12-15 MED ORDER — NITROGLYCERIN 0.4 MG SL SUBL: 0.4000 mg | SUBLINGUAL_TABLET | SUBLINGUAL | Status: DC | PRN

## 2019-12-15 MED ORDER — ONDANSETRON HCL 4 MG/2ML IJ SOLN
INTRAMUSCULAR | Status: DC | PRN
  Administered 2019-12-15: 4 mg via INTRAVENOUS

## 2019-12-15 MED ORDER — POLYETHYLENE GLYCOL 3350 17 GM/SCOOP PO POWD: 1.0000 | Freq: Once | ORAL | Status: DC

## 2019-12-15 MED ORDER — PROPOFOL 10 MG/ML IV BOLUS
INTRAVENOUS | Status: DC | PRN
  Administered 2019-12-15: 130 mg via INTRAVENOUS

## 2019-12-15 MED ORDER — ROCURONIUM BROMIDE 10 MG/ML (PF) SYRINGE
PREFILLED_SYRINGE | INTRAVENOUS | Status: DC | PRN
  Administered 2019-12-15: 70 mg via INTRAVENOUS

## 2019-12-15 MED ORDER — GABAPENTIN 300 MG PO CAPS
300.0000 mg | ORAL_CAPSULE | ORAL | Status: AC
  Administered 2019-12-15: 300 mg via ORAL
  Filled 2019-12-15: qty 1

## 2019-12-15 MED ORDER — KETOROLAC TROMETHAMINE 15 MG/ML IJ SOLN
15.0000 mg | INTRAMUSCULAR | Status: AC
  Administered 2019-12-15: 15 mg via INTRAVENOUS
  Filled 2019-12-15: qty 1

## 2019-12-15 MED ORDER — ROCURONIUM BROMIDE 10 MG/ML (PF) SYRINGE
PREFILLED_SYRINGE | INTRAVENOUS | Status: AC
  Filled 2019-12-15: qty 10

## 2019-12-15 MED ORDER — PROPOFOL 10 MG/ML IV BOLUS
INTRAVENOUS | Status: AC
  Filled 2019-12-15: qty 40

## 2019-12-15 MED ORDER — CARVEDILOL 3.125 MG PO TABS
3.1250 mg | ORAL_TABLET | Freq: Two times a day (BID) | ORAL | Status: DC
  Administered 2019-12-15 – 2019-12-22 (×14): 3.125 mg via ORAL
  Filled 2019-12-15 (×15): qty 1

## 2019-12-15 MED ORDER — LATANOPROST 0.005 % OP SOLN
1.0000 [drp] | Freq: Every day | OPHTHALMIC | Status: DC
  Administered 2019-12-15 – 2019-12-21 (×6): 1 [drp] via OPHTHALMIC
  Filled 2019-12-15 (×2): qty 2.5

## 2019-12-15 MED ORDER — BUPIVACAINE-EPINEPHRINE 0.25% -1:200000 IJ SOLN
INTRAMUSCULAR | Status: DC | PRN
  Administered 2019-12-15: 20 mL

## 2019-12-15 MED ORDER — PANTOPRAZOLE SODIUM 40 MG IV SOLR
40.0000 mg | INTRAVENOUS | Status: DC
  Administered 2019-12-15: 40 mg via INTRAVENOUS
  Filled 2019-12-15: qty 40

## 2019-12-15 MED ORDER — FENTANYL CITRATE (PF) 250 MCG/5ML IJ SOLN
INTRAMUSCULAR | Status: AC
  Filled 2019-12-15: qty 5

## 2019-12-15 MED ORDER — DIPHENHYDRAMINE HCL 50 MG/ML IJ SOLN
INTRAMUSCULAR | Status: DC | PRN
  Administered 2019-12-15: 6.25 mg via INTRAVENOUS

## 2019-12-15 MED ORDER — LIDOCAINE 2% (20 MG/ML) 5 ML SYRINGE
INTRAMUSCULAR | Status: DC | PRN
  Administered 2019-12-15: 100 mg via INTRAVENOUS

## 2019-12-15 MED ORDER — LEVOTHYROXINE SODIUM 100 MCG PO TABS
100.0000 ug | ORAL_TABLET | Freq: Every day | ORAL | Status: DC
  Administered 2019-12-16 – 2019-12-22 (×7): 100 ug via ORAL
  Filled 2019-12-15 (×7): qty 1

## 2019-12-15 MED ORDER — NEOMYCIN SULFATE 500 MG PO TABS: 1000.0000 mg | ORAL_TABLET | ORAL | Status: DC

## 2019-12-15 MED ORDER — CELECOXIB 200 MG PO CAPS: 200.0000 mg | ORAL_CAPSULE | Freq: Once | ORAL | Status: DC

## 2019-12-15 MED ORDER — TAMOXIFEN CITRATE 10 MG PO TABS
20.0000 mg | ORAL_TABLET | Freq: Every day | ORAL | Status: DC
  Administered 2019-12-16 – 2019-12-22 (×7): 20 mg via ORAL
  Filled 2019-12-15 (×7): qty 2

## 2019-12-15 MED ORDER — PHENYLEPHRINE 40 MCG/ML (10ML) SYRINGE FOR IV PUSH (FOR BLOOD PRESSURE SUPPORT): PREFILLED_SYRINGE | INTRAVENOUS | Status: DC | PRN

## 2019-12-15 MED ORDER — HEPARIN SODIUM (PORCINE) 5000 UNIT/ML IJ SOLN
5000.0000 [IU] | Freq: Three times a day (TID) | INTRAMUSCULAR | Status: DC
  Administered 2019-12-16 – 2019-12-22 (×19): 5000 [IU] via SUBCUTANEOUS
  Filled 2019-12-15 (×19): qty 1

## 2019-12-15 MED ORDER — HYDROCODONE-ACETAMINOPHEN 5-325 MG PO TABS
1.0000 | ORAL_TABLET | Freq: Four times a day (QID) | ORAL | Status: DC | PRN
  Administered 2019-12-15 – 2019-12-19 (×4): 2 via ORAL
  Filled 2019-12-15 (×5): qty 2

## 2019-12-15 MED ORDER — MORPHINE SULFATE (PF) 2 MG/ML IV SOLN
1.0000 mg | INTRAVENOUS | Status: DC | PRN
  Administered 2019-12-17: 1 mg via INTRAVENOUS
  Administered 2019-12-17 (×3): 2 mg via INTRAVENOUS
  Filled 2019-12-15 (×4): qty 1

## 2019-12-15 MED ORDER — PROMETHAZINE HCL 25 MG/ML IJ SOLN: 6.2500 mg | INTRAMUSCULAR | Status: DC | PRN

## 2019-12-15 MED ORDER — MOMETASONE FURO-FORMOTEROL FUM 200-5 MCG/ACT IN AERO
2.0000 | INHALATION_SPRAY | Freq: Two times a day (BID) | RESPIRATORY_TRACT | Status: DC
  Administered 2019-12-15 – 2019-12-22 (×12): 2 via RESPIRATORY_TRACT
  Filled 2019-12-15 (×2): qty 8.8

## 2019-12-15 MED ORDER — FENTANYL CITRATE (PF) 100 MCG/2ML IJ SOLN
25.0000 ug | INTRAMUSCULAR | Status: DC | PRN
  Administered 2019-12-15: 25 ug via INTRAVENOUS
  Administered 2019-12-15: 50 ug via INTRAVENOUS

## 2019-12-15 MED ORDER — ACETAMINOPHEN 500 MG PO TABS: 1000.0000 mg | ORAL_TABLET | ORAL | Status: DC

## 2019-12-15 SURGICAL SUPPLY — 67 items
APPLIER CLIP ROT 10 11.4 M/L (STAPLE)
BLADE CLIPPER SURG (BLADE) IMPLANT
CANISTER SUCT 3000ML PPV (MISCELLANEOUS) ×2 IMPLANT
CELLS DAT CNTRL 66122 CELL SVR (MISCELLANEOUS) ×1 IMPLANT
CHLORAPREP W/TINT 26 (MISCELLANEOUS) ×2 IMPLANT
CLIP APPLIE ROT 10 11.4 M/L (STAPLE) IMPLANT
COVER MAYO STAND STRL (DRAPES) ×2 IMPLANT
COVER SURGICAL LIGHT HANDLE (MISCELLANEOUS) ×2 IMPLANT
COVER WAND RF STERILE (DRAPES) ×2 IMPLANT
DRAPE HALF SHEET 40X57 (DRAPES) ×2 IMPLANT
DRAPE LAPAROSCOPIC ABDOMINAL (DRAPES) ×2 IMPLANT
DRAPE UTILITY XL STRL (DRAPES) ×2 IMPLANT
DRAPE WARM FLUID 44X44 (DRAPES) ×2 IMPLANT
DRSG OPSITE POSTOP 4X10 (GAUZE/BANDAGES/DRESSINGS) IMPLANT
DRSG OPSITE POSTOP 4X8 (GAUZE/BANDAGES/DRESSINGS) ×2 IMPLANT
DRSG TEGADERM 2-3/8X2-3/4 SM (GAUZE/BANDAGES/DRESSINGS) ×2 IMPLANT
ELECT CAUTERY BLADE 6.4 (BLADE) ×4 IMPLANT
ELECT REM PT RETURN 9FT ADLT (ELECTROSURGICAL) ×2
ELECTRODE REM PT RTRN 9FT ADLT (ELECTROSURGICAL) ×1 IMPLANT
GAUZE 4X4 16PLY RFD (DISPOSABLE) ×2 IMPLANT
GAUZE SPONGE 2X2 8PLY STRL LF (GAUZE/BANDAGES/DRESSINGS) ×1 IMPLANT
GLOVE BIO SURGEON STRL SZ7.5 (GLOVE) ×4 IMPLANT
GOWN STRL REUS W/ TWL LRG LVL3 (GOWN DISPOSABLE) ×6 IMPLANT
GOWN STRL REUS W/TWL LRG LVL3 (GOWN DISPOSABLE) ×12
KIT BASIN OR (CUSTOM PROCEDURE TRAY) ×2 IMPLANT
KIT TURNOVER KIT B (KITS) ×2 IMPLANT
LIGASURE IMPACT 36 18CM CVD LR (INSTRUMENTS) IMPLANT
NEEDLE HYPO 25GX1X1/2 BEV (NEEDLE) ×2 IMPLANT
NS IRRIG 1000ML POUR BTL (IV SOLUTION) ×4 IMPLANT
PAD ARMBOARD 7.5X6 YLW CONV (MISCELLANEOUS) ×4 IMPLANT
PENCIL SMOKE EVACUATOR (MISCELLANEOUS) ×4 IMPLANT
RELOAD PROXIMATE 75MM BLUE (ENDOMECHANICALS) ×4 IMPLANT
RTRCTR WOUND ALEXIS 18CM MED (MISCELLANEOUS) ×2
SCISSORS LAP 5X35 DISP (ENDOMECHANICALS) IMPLANT
SET IRRIG TUBING LAPAROSCOPIC (IRRIGATION / IRRIGATOR) ×2 IMPLANT
SET TUBE SMOKE EVAC HIGH FLOW (TUBING) ×2 IMPLANT
SHEARS HARMONIC ACE PLUS 36CM (ENDOMECHANICALS) ×2 IMPLANT
SLEEVE ENDOPATH XCEL 5M (ENDOMECHANICALS) ×2 IMPLANT
SPECIMEN JAR LARGE (MISCELLANEOUS) ×2 IMPLANT
SPONGE GAUZE 2X2 STER 10/PKG (GAUZE/BANDAGES/DRESSINGS) ×1
SPONGE LAP 18X18 RF (DISPOSABLE) ×2 IMPLANT
STAPLER GUN LINEAR PROX 60 (STAPLE) ×2 IMPLANT
STAPLER PROXIMATE 75MM BLUE (STAPLE) ×2 IMPLANT
STAPLER VISISTAT 35W (STAPLE) ×4 IMPLANT
SUT PDS AB 1 CT  36 (SUTURE) ×4
SUT PDS AB 1 CT 36 (SUTURE) ×2 IMPLANT
SUT PDS AB 1 TP1 96 (SUTURE) ×4 IMPLANT
SUT SILK 2 0 (SUTURE) ×2
SUT SILK 2 0 SH CR/8 (SUTURE) ×2 IMPLANT
SUT SILK 2-0 18XBRD TIE 12 (SUTURE) ×1 IMPLANT
SUT SILK 3 0 (SUTURE) ×4
SUT SILK 3 0 SH CR/8 (SUTURE) ×2 IMPLANT
SUT SILK 3-0 18XBRD TIE 12 (SUTURE) ×2 IMPLANT
SUT VICRYL 0 UR6 27IN ABS (SUTURE) ×2 IMPLANT
SYR BULB IRRIGATION 50ML (SYRINGE) ×2 IMPLANT
SYS LAPSCP GELPORT 120MM (MISCELLANEOUS)
SYSTEM LAPSCP GELPORT 120MM (MISCELLANEOUS) IMPLANT
TOWEL GREEN STERILE (TOWEL DISPOSABLE) ×4 IMPLANT
TRAY COLON PACK (CUSTOM PROCEDURE TRAY) ×2 IMPLANT
TRAY FOLEY MTR SLVR 16FR STAT (SET/KITS/TRAYS/PACK) ×2 IMPLANT
TROCAR XCEL 12X100 BLDLESS (ENDOMECHANICALS) IMPLANT
TROCAR XCEL BLUNT TIP 100MML (ENDOMECHANICALS) ×2 IMPLANT
TROCAR XCEL NON-BLD 11X100MML (ENDOMECHANICALS) IMPLANT
TROCAR XCEL NON-BLD 5MMX100MML (ENDOMECHANICALS) ×2 IMPLANT
TUBE CONNECTING 12X1/4 (SUCTIONS) ×4 IMPLANT
WATER STERILE IRR 1000ML POUR (IV SOLUTION) ×2 IMPLANT
YANKAUER SUCT BULB TIP NO VENT (SUCTIONS) ×4 IMPLANT

## 2019-12-15 NOTE — Anesthesia Procedure Notes (Signed)
Procedure Name: Intubation Date/Time: 12/15/2019 9:09 AM Performed by: Kathryne Hitch, CRNA Pre-anesthesia Checklist: Patient identified, Emergency Drugs available, Suction available and Patient being monitored Patient Re-evaluated:Patient Re-evaluated prior to induction Oxygen Delivery Method: Circle system utilized Preoxygenation: Pre-oxygenation with 100% oxygen Induction Type: IV induction Ventilation: Mask ventilation without difficulty Laryngoscope Size: Miller and 2 Grade View: Grade I Tube type: Oral Tube size: 7.0 mm Number of attempts: 1 Airway Equipment and Method: Stylet and Oral airway Placement Confirmation: ETT inserted through vocal cords under direct vision,  positive ETCO2 and breath sounds checked- equal and bilateral Secured at: 21 cm Tube secured with: Tape Dental Injury: Teeth and Oropharynx as per pre-operative assessment

## 2019-12-15 NOTE — Op Note (Signed)
12/15/2019  10:12 AM  PATIENT:  Marissa Caldwell  86 y.o. female  PRE-OPERATIVE DIAGNOSIS:  RIGHT COLON CANCER  POST-OPERATIVE DIAGNOSIS:  RIGHT COLON CANCER  PROCEDURE:  Procedure(s): LAPAROSCOPIC ASSISTED RIGHT COLECTOMY (Right)  SURGEON:  Surgeon(s) and Role:    * Jovita Kussmaul, MD - Primary    * Georganna Skeans, MD - Assisting  PHYSICIAN ASSISTANT:   ASSISTANTS: Dr. Grandville Silos   ANESTHESIA:   local and general  EBL:  minimal   BLOOD ADMINISTERED:none  DRAINS: none   LOCAL MEDICATIONS USED:  MARCAINE     SPECIMEN:  Source of Specimen:  right colon with cancer and terminal ileum  DISPOSITION OF SPECIMEN:  PATHOLOGY  COUNTS:  YES  TOURNIQUET:  * No tourniquets in log *  DICTATION: .Dragon Dictation   After informed consent was obtained the patient was brought to the operating room and placed in the supine position on the operating table.  After adequate induction of general anesthesia the patient's abdomen was prepped with ChloraPrep, allowed to dry, and draped in usual sterile manner.  An appropriate timeout was performed.  The area below the umbilicus was infiltrated with quarter percent Marcaine.  A small vertically oriented incision was made with a 15 blade knife.  The incision was carried through the subcutaneous tissue bluntly with a hemostat and [employer] retractors.  The linea alba was also incised with the 15 blade knife.  Each side was grasped with Kocher clamps and elevated anteriorly.  The preperitoneal space was probed bluntly with a hemostat until the peritoneum was opened and access was gained to the abdominal cavity.  A 0 Vicryl pursestring stitch was placed in the fashion around the opening.  The Hassan cannula was placed through the opening and anchored in place with the previously placed Vicryl pursestring stitch.  The abdomen was then insufflated with carbon dioxide without difficulty.  The abdomen was inspected and the tattoo of the right colon was  identified readily.  No other abnormalities were noted on general inspection of the abdomen.  A another 5 mm port was placed in the lower midline under direct vision after infiltrating the area with quarter percent Marcaine.  A third port was placed in the left upper quadrant under direct vision also after infiltrating the area with quarter percent Marcaine.  The camera was then moved to the lower midline port.  I was then able to mobilize the right colon by incising its retroperitoneal attachment along the white line of Toldt with the harmonic scalpel.  Once we had good mobilization of the right colon I then made a small incision linking the infraumbilical incision in the lower midline incision.  The ports were removed and the incision was opened under direct vision.  A wound protector was deployed.  The right colon was easily able to be brought up into the wound.  A site was chosen on the terminal ileum and at the hepatic flexure for division of the small bowel and colon.  The mesentery to the sites was opened sharply with the electrocautery.  Each site was then divided by a single firing of the GIA-75 stapler.  The mesentery to the right colon was taken down sharply with the harmonic scalpel and the main right colonic vessel was clamped with a Claiborne Billings, divided with the harmonic, and ligated with a 2-0 silk suture ligature.  This was sent as terminal ileum and right colon with the cancer to pathology for further evaluation.  The distal small bowel and  transverse colon approximated each other easily.  A small opening was made on the antimesenteric surface of each limb of small bowel and colon.  Each limb of a GIA-75 stapler was then placed down the appropriate limb of small bowel or colon, clamped, and fired thereby creating a nice widely patent enteroenterostomy.  The common opening was closed with a single firing of a TA 60 stapler.  The staple line was reinforced with several 2-0 silk Lembert stitches and a 2-0  silk crotch stitch.  The mesentery was then closed with multiple 2-0 silk figure-of-eight stitches.  Once this was accomplished the anastomosis looked healthy and widely patent with no tension.  The anastomosis was placed back in the right side of the abdominal cavity.  The abdomen was then irrigated with copious amounts of saline until the effluent was clear.  The operative site was inspected and appeared to be hemostatic.  The liver was palpated and felt normal.  The small bowel and colon were run in their entirety and no other abnormalities were noted.  At this point the fascia of the anterior abdominal wall was closed with 2 running #1 double-stranded looped PDS sutures.  The subcutaneous tissue was irrigated with copious amounts of saline and the skin incisions were closed with staples.  Sterile dressings were applied.  The patient tolerated the procedure well.  At the end of the case all needle sponge and instrument counts were correct.  The patient was then awakened and taken to recovery in stable condition.  The assistant was crucial for completion of the case.  PLAN OF CARE: Admit to inpatient   PATIENT DISPOSITION:  PACU - hemodynamically stable.   Delay start of Pharmacological VTE agent (>24hrs) due to surgical blood loss or risk of bleeding: no

## 2019-12-15 NOTE — H&P (Signed)
Marissa Caldwell  Location: [facility] Patient #: [id number] DOB: 1932/06/01 Single / Language: Cleophus Molt / Race: White Female   History of Present Illness  The patient is a 86 year old female who presents for a follow-up for Colorectal cancer. The patient is an 86 year old white female who is 4 months status post right breast lumpectomy and sentinel node biopsy for a T1cN1a right breast cancer that was ER and PR positive and HER-2 negative with a Ki-67 of 10%. Her workup included a PET scan which showed something in her right colon. She then underwent colonoscopy which found a small mass in the right colon that was nonobstructing. This was biopsied and came back as an adenocarcinoma. She denies any abdominal pain. She denies any weight loss. She denies any nausea or vomiting. She tolerated her recent breast surgery well.   Allergies  Lipitor *ANTIHYPERLIPIDEMICS*  Elevated LFT's  Medication History  Aspirin (81MG Tablet DR, Oral) Active. Albuterol (90MCG/ACT Aerosol Soln, Inhalation) Active. Symbicort (160-4.5MCG/ACT Aerosol, Inhalation) Active. Coreg (3.125MG Tablet, Oral) Active. Remeron (15MG Tablet, Oral) Active. Nitrostat (0.4MG Tab Sublingual, Sublingual) Active. LORazepam (0.5MG Tablet, Oral) Active. Pravastatin Sodium (40MG Tablet, Oral) Active. Zoloft (50MG Tablet, Oral) Active. Toviaz (4MG Tablet ER 24HR, Oral) Active. Vitamin D3 (50 MCG(2000 UT) Capsule, Oral) Active. Coenzyme Q-10 (100MG Capsule, Oral) Active. Xalatan (0.005% Solution, Ophthalmic) Active. Trusopt (2% Solution, Ophthalmic) Active. Omega 3 (1000MG Capsule, Oral) Active. Medications Reconciled    Review of Systems General Not Present- Appetite Loss, Chills, Fatigue, Fever, Night Sweats, Weight Gain and Weight Loss. Skin Not Present- Change in Wart/Mole, Dryness, Hives, Jaundice, New Lesions, Non-Healing Wounds, Rash and Ulcer. HEENT Not Present- Earache, Hearing Loss,  Hoarseness, Nose Bleed, Oral Ulcers, Ringing in the Ears, Seasonal Allergies, Sinus Pain, Sore Throat, Visual Disturbances, Wears glasses/contact lenses and Yellow Eyes. Respiratory Not Present- Bloody sputum, Chronic Cough, Difficulty Breathing, Snoring and Wheezing. Breast Not Present- Breast Mass, Breast Pain, Nipple Discharge and Skin Changes. Cardiovascular Not Present- Chest Pain, Difficulty Breathing Lying Down, Leg Cramps, Palpitations, Rapid Heart Rate, Shortness of Breath and Swelling of Extremities. Gastrointestinal Not Present- Abdominal Pain, Bloating, Bloody Stool, Change in Bowel Habits, Chronic diarrhea, Constipation, Difficulty Swallowing, Excessive gas, Gets full quickly at meals, Hemorrhoids, Indigestion, Nausea, Rectal Pain and Vomiting. Female Genitourinary Not Present- Frequency, Nocturia, Painful Urination, Pelvic Pain and Urgency. Musculoskeletal Not Present- Back Pain, Joint Pain, Joint Stiffness, Muscle Pain, Muscle Weakness and Swelling of Extremities. Neurological Not Present- Decreased Memory, Fainting, Headaches, Numbness, Seizures, Tingling, Tremor, Trouble walking and Weakness. Psychiatric Present- Anxiety. Not Present- Bipolar, Change in Sleep Pattern, Depression, Fearful and Frequent crying. Endocrine Not Present- Cold Intolerance, Excessive Hunger, Hair Changes, Heat Intolerance, Hot flashes and New Diabetes. Hematology Not Present- Blood Thinners, Easy Bruising, Excessive bleeding, Gland problems, HIV and Persistent Infections.  Vitals Weight: 147.38 lb Height: 69in Body Surface Area: 1.81 m Body Mass Index: 21.76 kg/m  Temp.: 97.54F (Tympanic)  Pulse: 86 (Regular)  BP: 132/88(Sitting, Left Arm, Standard)       Physical Exam General Mental Status-Alert. General Appearance-Consistent with stated age. Hydration-Well hydrated. Voice-Normal.  Head and Neck Head-normocephalic, atraumatic with no lesions or palpable  masses. Trachea-midline. Thyroid Gland Characteristics - normal size and consistency.  Eye Eyeball - Bilateral-Extraocular movements intact. Sclera/Conjunctiva - Bilateral-No scleral icterus.  Chest and Lung Exam Chest and lung exam reveals -quiet, even and easy respiratory effort with no use of accessory muscles and on auscultation, normal breath sounds, no adventitious sounds and normal vocal resonance. Inspection  Chest Wall - Normal. Back - normal.  Breast Note: The right breast and axillary incisions are healing nicely with no sign of infection or seroma.   Cardiovascular Cardiovascular examination reveals -normal heart sounds, regular rate and rhythm with no murmurs and normal pedal pulses bilaterally.  Abdomen Inspection Inspection of the abdomen reveals - No Hernias. Skin - Scar - no surgical scars. Palpation/Percussion Palpation and Percussion of the abdomen reveal - Soft, Non Tender, No Rebound tenderness, No Rigidity (guarding) and No hepatosplenomegaly. Auscultation Auscultation of the abdomen reveals - Bowel sounds normal.  Neurologic Neurologic evaluation reveals -alert and oriented x 3 with no impairment of recent or remote memory. Mental Status-Normal.  Musculoskeletal Normal Exam - Left-Upper Extremity Strength Normal and Lower Extremity Strength Normal. Normal Exam - Right-Upper Extremity Strength Normal and Lower Extremity Strength Normal.  Lymphatic Head & Neck  General Head & Neck Lymphatics: Bilateral - Description - Normal. Axillary  General Axillary Region: Bilateral - Description - Normal. Tenderness - Non Tender. Femoral & Inguinal  Generalized Femoral & Inguinal Lymphatics: Bilateral - Description - Normal. Tenderness - Non Tender.    Assessment & Plan MALIGNANT NEOPLASM OF UPPER-OUTER QUADRANT OF RIGHT BREAST IN FEMALE, ESTROGEN RECEPTOR POSITIVE (C50.411) COLON CANCER, ASCENDING (C18.2) Impression: The patient is  about 4 months status post right breast lumpectomy for breast cancer. She did have positive nodes and as part of her workup she underwent a PET scan. The PET scan lit up something in the right colon. She then underwent a colonoscopy and was found to have a small right colon cancer. Since she just tolerated her recent breast surgery think she could also tolerate abdominal surgery as well. In order to prevent spread and obstruction that would recommend that she have a right colectomy. I have discussed with her in detail the risks and benefits of the operation as well as some of the technical aspects and she understands and wishes to proceed. I think should be a good laparoscopic candidate. I will discuss this with her oncologist and then we will proceed if everybody is in agreement.

## 2019-12-15 NOTE — Interval H&P Note (Signed)
History and Physical Interval Note:  12/15/2019 8:10 AM  Marissa Caldwell  has presented today for surgery, with the diagnosis of RIGHT COLON CANCER.  The various methods of treatment have been discussed with the patient and family. After consideration of risks, benefits and other options for treatment, the patient has consented to  Procedure(s): LAPAROSCOPIC RIGHT COLECTOMY (Right) as a surgical intervention.  The patient's history has been reviewed, patient examined, no change in status, stable for surgery.  I have reviewed the patient's chart and labs.  Questions were answered to the patient's satisfaction.     Autumn Messing III

## 2019-12-15 NOTE — Transfer of Care (Signed)
Immediate Anesthesia Transfer of Care Note  Patient: Marissa Caldwell  Procedure(s) Performed: LAPAROSCOPIC ASSISTED RIGHT COLECTOMY (Right Abdomen)  Patient Location: PACU  Anesthesia Type:General  Level of Consciousness: drowsy and patient cooperative  Airway & Oxygen Therapy: Patient Spontanous Breathing  Post-op Assessment: Report given to RN and Post -op Vital signs reviewed and stable  Post vital signs: Reviewed and stable  Last Vitals:  Vitals Value Taken Time  BP 140/85 12/15/19 1028  Temp    Pulse 66 12/15/19 1028  Resp 19 12/15/19 1028  SpO2 96 % 12/15/19 1028  Vitals shown include unvalidated device data.  Last Pain:  Vitals:   12/15/19 0739  TempSrc:   PainSc: 0-No pain      Patients Stated Pain Goal: 2 ([id number])  Complications: No apparent anesthesia complications

## 2019-12-15 NOTE — Anesthesia Postprocedure Evaluation (Signed)
Anesthesia Post Note  Patient: Marissa Caldwell  Procedure(s) Performed: LAPAROSCOPIC ASSISTED RIGHT COLECTOMY (Right Abdomen)     Patient location during evaluation: PACU Anesthesia Type: General Level of consciousness: sedated Pain management: pain level controlled Vital Signs Assessment: post-procedure vital signs reviewed and stable Respiratory status: spontaneous breathing and respiratory function stable Cardiovascular status: stable Postop Assessment: no apparent nausea or vomiting Anesthetic complications: no    Last Vitals:  Vitals:   12/15/19 1115 12/15/19 1130  BP: (!) 111/59 122/62  Pulse: 62 62  Resp: 16 11  Temp:    SpO2: 99% 97%    Last Pain:  Vitals:   12/15/19 1042  TempSrc:   PainSc: 5                  Bodin Gorka DANIEL

## 2019-12-16 LAB — CBC
HCT: 32.7 % — ABNORMAL LOW (ref 36.0–46.0)
Hemoglobin: 10.8 g/dL — ABNORMAL LOW (ref 12.0–15.0)
MCH: 31.7 pg (ref 26.0–34.0)
MCHC: 33 g/dL (ref 30.0–36.0)
MCV: 95.9 fL (ref 80.0–100.0)
Platelets: 228 10*3/uL (ref 150–400)
RBC: 3.41 MIL/uL — ABNORMAL LOW (ref 3.87–5.11)
RDW: 13.1 % (ref 11.5–15.5)
WBC: 7.3 10*3/uL (ref 4.0–10.5)
nRBC: 0 % (ref 0.0–0.2)

## 2019-12-16 LAB — BASIC METABOLIC PANEL
Anion gap: 8 (ref 5–15)
BUN: 14 mg/dL (ref 8–23)
CO2: 21 mmol/L — ABNORMAL LOW (ref 22–32)
Calcium: 7.6 mg/dL — ABNORMAL LOW (ref 8.9–10.3)
Chloride: 98 mmol/L (ref 98–111)
Creatinine, Ser: 0.72 mg/dL (ref 0.44–1.00)
GFR calc Af Amer: >60 mL/min (ref >60)
GFR calc non Af Amer: >60 mL/min (ref >60)
Glucose, Bld: 123 mg/dL — ABNORMAL HIGH (ref 70–99)
Potassium: 3.5 mmol/L (ref 3.5–5.1)
Sodium: 127 mmol/L — ABNORMAL LOW (ref 135–145)

## 2019-12-16 MED ORDER — PANTOPRAZOLE SODIUM 40 MG PO TBEC
40.0000 mg | DELAYED_RELEASE_TABLET | Freq: Every day | ORAL | Status: DC
  Administered 2019-12-17 – 2019-12-22 (×6): 40 mg via ORAL
  Filled 2019-12-16 (×7): qty 1

## 2019-12-16 NOTE — Progress Notes (Signed)
1 Day Post-Op   Subjective/Chief Complaint: Complains only of soreness. No nausea   Objective: Vital signs in last 24 hours: Temp:  [97.6 F (36.4 C)-98.2 F (36.8 C)] 97.6 F (36.4 C) (04/27 0456) Pulse Rate:  [62-73] 69 (04/27 0456) Resp:  [11-20] 18 (04/27 0456) BP: (100-140)/(54-85) 134/75 (04/27 0456) SpO2:  [92 %-99 %] 97 % (04/27 0456) Last BM Date: 12/14/19  Intake/Output from previous day: 04/26 0701 - 04/27 0700 In: 2067.7 [P.O.:780; I.V.:937.7; IV Piggyback:350] Out: [id number] [Urine:1325; Blood:50] Intake/Output this shift: No intake/output data recorded.  General appearance: alert and cooperative Resp: clear to auscultation bilaterally Cardio: regular rate and rhythm GI: soft, appropriately tender. good bs. incision looks good  Lab Results:  Recent Labs    12/16/19 0148  WBC 7.3  HGB 10.8*  HCT 32.7*  PLT 228   BMET Recent Labs    12/16/19 0148  NA 127*  K 3.5  CL 98  CO2 21*  GLUCOSE 123*  BUN 14  CREATININE 0.72  CALCIUM 7.6*   PT/INR No results for input(s): LABPROT, INR in the last 72 hours. ABG No results for input(s): PHART, HCO3 in the last 72 hours.  Invalid input(s): PCO2, PO2  Studies/Results: No results found.  Anti-infectives: Anti-infectives (From admission, onward)   Start     Dose/Rate Route Frequency Ordered Stop   12/15/19 1400  neomycin (MYCIFRADIN) tablet 1,000 mg  Status:  Discontinued     1,000 mg Oral 3 times per day 12/15/19 0717 12/15/19 0723   12/15/19 1400  metroNIDAZOLE (FLAGYL) tablet 1,000 mg  Status:  Discontinued     1,000 mg Oral 3 times per day 12/15/19 0717 12/15/19 0723   12/15/19 0730  cefoTEtan (CEFOTAN) 2 g in sodium chloride 0.9 % 100 mL IVPB     2 g 200 mL/hr over 30 Minutes Intravenous On call to O.R. 12/15/19 0717 12/15/19 0915      Assessment/Plan: s/p Procedure(s): LAPAROSCOPIC ASSISTED RIGHT COLECTOMY (Right) Advance diet slowly OOB to chair today D/c foley Pod 1  LOS: 1 day     Autumn Messing III 12/16/2019

## 2019-12-17 NOTE — Progress Notes (Signed)
2 Days Post-Op   Subjective/Chief Complaint: Complained of pain overnight. Feels better this am   Objective: Vital signs in last 24 hours: Temp:  [97.6 F (36.4 C)-98.6 F (37 C)] 98.6 F (37 C) (04/28 0617) Pulse Rate:  [66-75] 70 (04/28 0617) Resp:  [16-18] 16 (04/28 0617) BP: (103-140)/(58-73) 134/71 (04/28 0617) SpO2:  [95 %-96 %] 95 % (04/28 0819) Last BM Date: 12/14/19  Intake/Output from previous day: 04/27 0701 - 04/28 0700 In: 1130.1 [P.O.:780; I.V.:350.1] Out: 525 [Urine:525] Intake/Output this shift: Total I/O In: -  Out: 300 [Urine:300]  General appearance: alert and cooperative Resp: clear to auscultation bilaterally Cardio: regular rate and rhythm GI: soft, mild tenderness. good bs. incision looks good  Lab Results:  Recent Labs    12/16/19 0148  WBC 7.3  HGB 10.8*  HCT 32.7*  PLT 228   BMET Recent Labs    12/16/19 0148  NA 127*  K 3.5  CL 98  CO2 21*  GLUCOSE 123*  BUN 14  CREATININE 0.72  CALCIUM 7.6*   PT/INR No results for input(s): LABPROT, INR in the last 72 hours. ABG No results for input(s): PHART, HCO3 in the last 72 hours.  Invalid input(s): PCO2, PO2  Studies/Results: No results found.  Anti-infectives: Anti-infectives (From admission, onward)   Start     Dose/Rate Route Frequency Ordered Stop   12/15/19 1400  neomycin (MYCIFRADIN) tablet 1,000 mg  Status:  Discontinued     1,000 mg Oral 3 times per day 12/15/19 0717 12/15/19 0723   12/15/19 1400  metroNIDAZOLE (FLAGYL) tablet 1,000 mg  Status:  Discontinued     1,000 mg Oral 3 times per day 12/15/19 0717 12/15/19 0723   12/15/19 0730  cefoTEtan (CEFOTAN) 2 g in sodium chloride 0.9 % 100 mL IVPB     2 g 200 mL/hr over 30 Minutes Intravenous On call to O.R. 12/15/19 0717 12/15/19 0915      Assessment/Plan: s/p Procedure(s): LAPAROSCOPIC ASSISTED RIGHT COLECTOMY (Right) Advance diet  Ambulate Will get PT eval Pod 2  LOS: 2 days    Autumn Messing III 12/17/2019

## 2019-12-17 NOTE — TOC Initial Note (Signed)
Transition of Care [facility]) - Initial/Assessment Note    Patient Details  Name: Marissa Caldwell MRN: [id number] Date of Birth: [birth year]  Transition of Care [facility]) CM/SW Contact:    Alexander Mt, LCSW Phone Number: 12/17/2019, 4:42 PM  Clinical Narrative:                 CSW spoke with pt at bedside. Introduced self, role, reason for visit. Pt from home alone, she has a cane that she uses sometimes but usually ambulates independently. Pt states that she doesn't want to be rushed or kicked out of hospital. CSW explained that would be up to Dr. Marlou Starks as to when she is medically ready. Pt aware of recommendations for rehab but not totally in agreement. She does give permission for CSW to speak with her son Josph Macho. CSW will initiate rehab work up and follow up with son tomorrow morning.   Expected Discharge Plan: Skilled Nursing Facility Barriers to Discharge: Continued Medical Work up, Ship broker   Patient Goals and CMS Choice Patient states their goals for this hospitalization and ongoing recovery are:: to get walking again CMS Medicare.gov Compare Post Acute Care list provided to:: Patient Choice offered to / list presented to : Patient, Adult Children  Expected Discharge Plan and Services Expected Discharge Plan: [place] In-house Referral: Clinical Social Work Discharge Planning Services: CM Consult Post Acute Care Choice: Durable Medical Equipment, [place] Living arrangements for the past 2 months: Apartment                  Prior Living Arrangements/Services Living arrangements for the past 2 months: Apartment Lives with:: Self Patient language and need for interpreter reviewed:: Yes(no needs) Do you feel safe going back to the place where you live?: Yes      Need for Family Participation in Patient Care: Yes (Comment)(assistance w/ daily cares) Care giver support system in place?: Yes (comment)(family can check in but not provide  assistance) Current home services: DME Criminal Activity/Legal Involvement Pertinent to Current Situation/Hospitalization: No - Comment as needed   Permission Sought/Granted Permission sought to share information with : Family Supports Permission granted to share information with : Yes, Verbal Permission Granted  Share Information with NAME: [place] granted to share info w AGENCY: SNFs  Permission granted to share info w Relationship: son  Permission granted to share info w Contact Information: [phone]  Emotional Assessment Appearance:: Appears stated age Attitude/Demeanor/Rapport: Self-Confident, Engaged Affect (typically observed): Blunt, Defensive Orientation: : Oriented to Self, Oriented to Place, Oriented to  Time, Oriented to Situation Alcohol / Substance Use: Not Applicable Psych Involvement: (n/a)  Admission diagnosis:  Cancer of right colon [facility]) [C18.2] Patient Active Problem List   Diagnosis Date Noted  . Cancer of right colon ([place]) 12/15/2019  . Malignant neoplasm of upper-outer quadrant of right breast in female, estrogen receptor positive ([place]) 04/03/2019  . Chronic diastolic heart failure ([place]) 06/25/2017  . COPD (chronic obstructive pulmonary disease) ([place]) 04/13/2017  . Chest pain 03/27/2017  . CAD S/P percutaneous coronary angioplasty 03/26/2017  . Abnormal nuclear stress test 03/25/2017  . Dyslipidemia 03/11/2017  . Hypothyroidism 03/11/2017  . Shortness of breath 03/11/2017   PCP:  [name], MD Pharmacy:   [place], [place] [address] Phone: [phone] Fax: [phone]  [facility] [address] [place], [place] AT [facility]  [address] [place] [address] Phone: [phone] Fax: [phone]  [facility] ([place]) [place], [place] [address] Phone: [phone] Fax: [phone]   Readmission Risk Interventions Readmission Risk Prevention Plan 12/17/2019  Transportation Screening Complete  PCP or Specialist Appt within 5-7 Days Complete  Home Care Screening Complete  Medication Review (RN CM) Referral to Pharmacy  Some recent data might be hidden

## 2019-12-17 NOTE — NC FL2 (Signed)
[place] LEVEL OF CARE SCREENING TOOL     IDENTIFICATION  Patient Name: Marissa Caldwell Birthdate: 1932/01/20 Sex: female Admission Date (Current Location): 12/15/2019  [facility] and [place] Number:  Herbalist and Address:  [facility] [place], [place] [address] [place], [place] [address]      Provider Number: [id number]  Attending Physician Name and Address:  Jovita Kussmaul, MD  Relative Name and Phone Number:       Current Level of Care: Hospital Recommended Level of Care: [place] Prior Approval Number:    Date Approved/Denied:   PASRR Number: [id number]  Discharge Plan: SNF    Current Diagnoses: Patient Active Problem List   Diagnosis Date Noted  . Cancer of right colon ([place]) 12/15/2019  . Malignant neoplasm of upper-outer quadrant of right breast in female, estrogen receptor positive ([place]) 04/03/2019  . Chronic diastolic heart failure ([place]) 06/25/2017  . COPD (chronic obstructive pulmonary disease) ([place]) 04/13/2017  . Chest pain 03/27/2017  . CAD S/P percutaneous coronary angioplasty 03/26/2017  . Abnormal nuclear stress test 03/25/2017  . Dyslipidemia 03/11/2017  . Hypothyroidism 03/11/2017  . Shortness of breath 03/11/2017    Orientation RESPIRATION BLADDER Height & Weight     Self, Time, Situation, Place(WDL)  Normal Continent Weight: 145 lb (65.8 kg) Height:  5\' 10"  (177.8 cm)  BEHAVIORAL SYMPTOMS/MOOD NEUROLOGICAL BOWEL NUTRITION STATUS      Continent Diet(see discharge summary)  AMBULATORY STATUS COMMUNICATION OF NEEDS Skin   Extensive Assist Verbally Surgical wounds, Other (Comment)(closed incision on abdomen; generalized ecchymosis)                       Personal Care Assistance Level of Assistance  Bathing, Feeding, Dressing Bathing Assistance: Maximum assistance Feeding assistance: Independent Dressing Assistance: Maximum assistance     Functional Limitations Info  Sight, Hearing,  Speech Sight Info: Adequate Hearing Info: Impaired(impaired) Speech Info: Adequate    SPECIAL CARE FACTORS FREQUENCY  OT (By licensed OT), PT (By licensed PT)     PT Frequency: 5x week OT Frequency: 5x week            Contractures Contractures Info: Not present    Additional Factors Info  Code Status, Allergies Code Status Info: Full Code Allergies Info: Lipitor (Atorvastatin)           Current Medications (12/17/2019):  This is the current hospital active medication list Current Facility-Administered Medications  Medication Dose Route Frequency Provider Last Rate Last Admin  . albuterol (PROVENTIL) (2.5 MG/3ML) 0.083% nebulizer solution 2.5 mg  2.5 mg Inhalation Q6H PRN Autumn Messing III, MD   2.5 mg at 12/17/19 1432  . brimonidine (ALPHAGAN) 0.2 % ophthalmic solution 1 drop  1 drop Left Eye BID Autumn Messing III, MD   1 drop at 12/17/19 1012  . carvedilol (COREG) tablet 3.125 mg  3.125 mg Oral BID Autumn Messing III, MD   3.125 mg at 12/17/19 1012  . dextrose 5 % and 0.9 % NaCl with KCl 20 mEq/L infusion   Intravenous Continuous Autumn Messing III, MD 10 mL/hr at 12/16/19 1456 New Bag at 12/16/19 1456  . feeding supplement (ENSURE SURGERY) liquid 237 mL  237 mL Oral BID BM Autumn Messing III, MD   237 mL at 12/17/19 1246  . fesoterodine (TOVIAZ) tablet 4 mg  4 mg Oral Daily Autumn Messing III, MD   4 mg at 12/17/19 1012  . heparin injection 5,000 Units  5,000 Units Subcutaneous Q8H Autumn Messing III, MD   5,000 Units at 12/17/19 1425  . HYDROcodone-acetaminophen (NORCO/VICODIN) 5-325 MG per tablet 1-2 tablet  1-2 tablet Oral Q6H PRN Jovita Kussmaul, MD   2 tablet at 12/16/19 0954  . latanoprost (XALATAN) 0.005 % ophthalmic solution 1 drop  1 drop Both Eyes QHS Autumn Messing III, MD   1 drop at 12/15/19 2205  . levothyroxine (SYNTHROID) tablet 100 mcg  100 mcg Oral QAC breakfast Autumn Messing III, MD   100 mcg at 12/17/19 0521  . mometasone-formoterol (DULERA) 200-5 MCG/ACT inhaler 2 puff  2 puff Inhalation  BID Autumn Messing III, MD   2 puff at 12/17/19 [phone]  . morphine 2 MG/ML injection 1-4 mg  1-4 mg Intravenous Q1H PRN Autumn Messing III, MD   1 mg at 12/17/19 1546  . nitroGLYCERIN (NITROSTAT) SL tablet 0.4 mg  0.4 mg Sublingual Q5 min PRN Autumn Messing III, MD      . ondansetron [facility]) tablet 4 mg  4 mg Oral Q6H PRN Autumn Messing III, MD   4 mg at 12/16/19 1847   Or  . ondansetron (ZOFRAN) injection 4 mg  4 mg Intravenous Q6H PRN Autumn Messing III, MD      . pantoprazole (PROTONIX) EC tablet 40 mg  40 mg Oral Q1200 Autumn Messing III, MD   40 mg at 12/17/19 1246  . pravastatin (PRAVACHOL) tablet 40 mg  40 mg Oral Daily Autumn Messing III, MD   40 mg at 12/17/19 1012  . tamoxifen (NOLVADEX) tablet 20 mg  20 mg Oral Daily Autumn Messing III, MD   20 mg at 12/17/19 1012     Discharge Medications: Please see discharge summary for a list of discharge medications.  Relevant Imaging Results:  Relevant Lab Results:   Additional Information SS#[id number] [place], [place]

## 2019-12-17 NOTE — Evaluation (Signed)
Physical Therapy Evaluation Patient Details Name: Marissa Caldwell MRN: [id number] DOB: 02-24-1932 Today's Date: 12/17/2019   History of Present Illness  Pt is an 86 y/o female with colon cancer and is now s/p R colectomy. PMH includes HTN, COPD, and CAD.   Clinical Impression  Pt admitted secondary to problem above with deficits below. Pt requiring mod A for bed mobility and min guard A for very short distance ambulation. Pt with poor tolerance secondary to fatigue and pain. Currently lives alone and reports no one available to stay with her. Will continue to follow acutely to maximize functional mobility independence and safety.     Follow Up Recommendations SNF    Equipment Recommendations  Rolling walker with 5" wheels    Recommendations for Other Services       Precautions / Restrictions Precautions Precautions: Fall Restrictions Weight Bearing Restrictions: No      Mobility  Bed Mobility Overal bed mobility: Needs Assistance Bed Mobility: Supine to Sit;Sit to Supine     Supine to sit: Mod assist Sit to supine: Supervision   General bed mobility comments: Mod A for trunk elevation to come to sitting. Educated about using log roll technique during bed mobility, however, pt pulling directly into sitting.   Transfers Overall transfer level: Needs assistance Equipment used: Rolling walker (2 wheeled) Transfers: Sit to/from Stand Sit to Stand: Min guard         General transfer comment: Min guard for steadying assist.   Ambulation/Gait Ambulation/Gait assistance: Min guard Gait Distance (Feet): 20 Feet Assistive device: Rolling walker (2 wheeled) Gait Pattern/deviations: Step-through pattern;Decreased stride length;Trunk flexed Gait velocity: Decreased   General Gait Details: Pt very guarded during gait. Poor tolerance and only able to ambulate short distance. Cues for proximity to device.   Stairs            Wheelchair Mobility    Modified Rankin  (Stroke Patients Only)       Balance Overall balance assessment: Needs assistance Sitting-balance support: Feet supported;No upper extremity supported Sitting balance-Leahy Scale: Fair     Standing balance support: Bilateral upper extremity supported;During functional activity Standing balance-Leahy Scale: Poor Standing balance comment: Reliant on BUE support                              Pertinent Vitals/Pain Pain Assessment: Faces Faces Pain Scale: Hurts even more Pain Location: abdomen Pain Descriptors / Indicators: Aching Pain Intervention(s): Limited activity within patient's tolerance;Monitored during session;Repositioned    Home Living Family/patient expects to be discharged to:: Private residence Living Arrangements: Alone Available Help at Discharge: Family Type of Home: Apartment Home Access: Stairs to enter Entrance Stairs-Rails: None Entrance Stairs-Number of Steps: 1 curb step  Home Layout: One level Home Equipment: Bedside commode Additional Comments: Son reports he is not able to stay with pt to help her    Prior Function Level of Independence: Independent               Hand Dominance        Extremity/Trunk Assessment   Upper Extremity Assessment Upper Extremity Assessment: Generalized weakness    Lower Extremity Assessment Lower Extremity Assessment: Generalized weakness    Cervical / Trunk Assessment Cervical / Trunk Assessment: Kyphotic  Communication   Communication: HOH  Cognition Arousal/Alertness: Awake/alert Behavior During Therapy: Anxious Overall Cognitive Status: Within Functional Limits for tasks assessed  General Comments: Pt anxious about mobility. Requiring max encouragement to participate.       General Comments General comments (skin integrity, edema, etc.): Pt reports she does not feel comfortable going home and does not feel she can care for herself.      Exercises     Assessment/Plan    PT Assessment Patient needs continued PT services  PT Problem List Decreased strength;Decreased mobility;Decreased activity tolerance;Decreased balance;Decreased knowledge of use of DME;Pain       PT Treatment Interventions Gait training;DME instruction;Stair training;Functional mobility training;Therapeutic activities;Balance training;Therapeutic exercise;Patient/family education    PT Goals (Current goals can be found in the Care Plan section)  Acute Rehab PT Goals Patient Stated Goal: to feel better PT Goal Formulation: With patient Time For Goal Achievement: 12/31/19 Potential to Achieve Goals: Good    Frequency Min 2X/week   Barriers to discharge Decreased caregiver support      Co-evaluation               AM-PAC PT "6 Clicks" Mobility  Outcome Measure Help needed turning from your back to your side while in a flat bed without using bedrails?: A Little Help needed moving from lying on your back to sitting on the side of a flat bed without using bedrails?: A Lot Help needed moving to and from a bed to a chair (including a wheelchair)?: A Little Help needed standing up from a chair using your arms (e.g., wheelchair or bedside chair)?: A Little Help needed to walk in hospital room?: A Little Help needed climbing 3-5 steps with a railing? : A Lot 6 Click Score: 16    End of Session   Activity Tolerance: Patient limited by fatigue;Patient limited by pain Patient left: in bed;with call bell/phone within reach;with bed alarm set;with family/visitor present Nurse Communication: Mobility status;Other (comment)(pt requesting some for "my nerves" ) PT Visit Diagnosis: Muscle weakness (generalized) (M62.81);Difficulty in walking, not elsewhere classified (R26.2)    Time: [id number] PT Time Calculation (min) (ACUTE ONLY): 29 min   Charges:   PT Evaluation $PT Eval Moderate Complexity: 1 Mod PT Treatments $Gait Training: 8-22 mins         Lou Miner, DPT  Acute Rehabilitation Services  Pager: [phone] Office: [phone]   Rudean Hitt 12/17/2019, 3:09 PM

## 2019-12-18 LAB — SURGICAL PATHOLOGY

## 2019-12-18 MED ORDER — ADULT MULTIVITAMIN W/MINERALS CH
1.0000 | ORAL_TABLET | Freq: Every day | ORAL | Status: DC
  Administered 2019-12-18 – 2019-12-21 (×4): 1 via ORAL
  Filled 2019-12-18 (×3): qty 1

## 2019-12-18 MED ORDER — BOOST / RESOURCE BREEZE PO LIQD CUSTOM
1.0000 | Freq: Three times a day (TID) | ORAL | Status: DC
  Administered 2019-12-18 – 2019-12-22 (×11): 1 via ORAL

## 2019-12-18 MED ORDER — LORAZEPAM 0.5 MG PO TABS
0.5000 mg | ORAL_TABLET | Freq: Every day | ORAL | Status: DC
  Administered 2019-12-18 – 2019-12-19 (×2): 0.5 mg via ORAL
  Filled 2019-12-18 (×2): qty 1

## 2019-12-18 NOTE — Progress Notes (Signed)
Physical Therapy Treatment Patient Details Name: Marissa Caldwell MRN: [id number] DOB: February 06, 1932 Today's Date: 12/18/2019    History of Present Illness Pt is an 86 y/o female with colon cancer and is now s/p R colectomy. PMH includes HTN, COPD, and CAD.     PT Comments    Pt progressing towards goals. Increased tolerance for gait, however, remains limited by fatigue and pain. Min guard throughout gait using RW. Pt continues to report weakness and feels she cannot care for herself at home alone. Current recommendations appropriate.  Will continue to follow acutely to maximize functional mobility independence and safety.    Follow Up Recommendations  SNF     Equipment Recommendations  Rolling walker with 5" wheels    Recommendations for Other Services       Precautions / Restrictions Precautions Precautions: Fall Restrictions Weight Bearing Restrictions: No    Mobility  Bed Mobility Overal bed mobility: Needs Assistance Bed Mobility: Rolling;Sidelying to Sit;Sit to Supine Rolling: Min assist Sidelying to sit: Mod assist   Sit to supine: Supervision   General bed mobility comments: Required min A for assist with rolling and mod A for trunk elevation to come to sitting. Increased time required secondary to pain   Transfers Overall transfer level: Needs assistance Equipment used: Rolling walker (2 wheeled) Transfers: Sit to/from Stand Sit to Stand: Min guard         General transfer comment: Min guard for steadying assist.   Ambulation/Gait Ambulation/Gait assistance: Min guard Gait Distance (Feet): 60 Feet Assistive device: Rolling walker (2 wheeled) Gait Pattern/deviations: Step-through pattern;Decreased stride length;Trunk flexed Gait velocity: Decreased   General Gait Details: Pt with improved tolerance, however, continues to be limited secondary to fatigue. Cues for upright posture and proximity to device. Slow, guarded gait.    Stairs              Wheelchair Mobility    Modified Rankin (Stroke Patients Only)       Balance Overall balance assessment: Needs assistance Sitting-balance support: Feet supported;No upper extremity supported Sitting balance-Leahy Scale: Fair     Standing balance support: Bilateral upper extremity supported;During functional activity Standing balance-Leahy Scale: Poor Standing balance comment: Reliant on BUE support                             Cognition Arousal/Alertness: Awake/alert Behavior During Therapy: WFL for tasks assessed/performed Overall Cognitive Status: Within Functional Limits for tasks assessed                                        Exercises      General Comments        Pertinent Vitals/Pain Pain Assessment: Faces Faces Pain Scale: Hurts even more Pain Location: abdomen Pain Descriptors / Indicators: Aching Pain Intervention(s): Limited activity within patient's tolerance;Monitored during session;Repositioned    Home Living Family/patient expects to be discharged to:: Private residence Living Arrangements: Alone                  Prior Function            PT Goals (current goals can now be found in the care plan section) Acute Rehab PT Goals Patient Stated Goal: to feel better PT Goal Formulation: With patient Time For Goal Achievement: 12/31/19 Potential to Achieve Goals: Good Progress towards PT goals: Progressing toward goals  Frequency    Min 2X/week      PT Plan Current plan remains appropriate    Co-evaluation              AM-PAC PT "6 Clicks" Mobility   Outcome Measure  Help needed turning from your back to your side while in a flat bed without using bedrails?: A Little Help needed moving from lying on your back to sitting on the side of a flat bed without using bedrails?: A Lot Help needed moving to and from a bed to a chair (including a wheelchair)?: A Little Help needed standing up from a  chair using your arms (e.g., wheelchair or bedside chair)?: A Little Help needed to walk in hospital room?: A Little Help needed climbing 3-5 steps with a railing? : A Lot 6 Click Score: 16    End of Session   Activity Tolerance: Patient limited by fatigue Patient left: in bed;with call bell/phone within reach;with bed alarm set Nurse Communication: Mobility status PT Visit Diagnosis: Muscle weakness (generalized) (M62.81);Difficulty in walking, not elsewhere classified (R26.2)     Time: [id number] PT Time Calculation (min) (ACUTE ONLY): 16 min  Charges:  $Gait Training: 8-22 mins                     Lou Miner, DPT  Acute Rehabilitation Services  Pager: [phone] Office: [phone]    Rudean Hitt 12/18/2019, 11:43 AM

## 2019-12-18 NOTE — Progress Notes (Signed)
3 Days Post-Op   Subjective/Chief Complaint: Still complains of intermittent pain. Passing lots of flatus. Not eating much yet   Objective: Vital signs in last 24 hours: Temp:  [97.8 F (36.6 C)-98.1 F (36.7 C)] 97.8 F (36.6 C) (04/29 0512) Pulse Rate:  [72-76] 73 (04/29 0512) Resp:  [17-18] 17 (04/29 0512) BP: (126-131)/(65-74) 126/74 (04/29 0512) SpO2:  [92 %-95 %] 95 % (04/29 0512) Last BM Date: 12/17/19  Intake/Output from previous day: 04/28 0701 - 04/29 0700 In: 504.3 [P.O.:300; I.V.:204.3] Out: 300 [Urine:300] Intake/Output this shift: Total I/O In: 240 [P.O.:240] Out: -   General appearance: alert and cooperative Resp: clear to auscultation bilaterally Cardio: regular rate and rhythm GI: soft, minimal tenderness. good bs. incision looks good  Lab Results:  Recent Labs    12/16/19 0148  WBC 7.3  HGB 10.8*  HCT 32.7*  PLT 228   BMET Recent Labs    12/16/19 0148  NA 127*  K 3.5  CL 98  CO2 21*  GLUCOSE 123*  BUN 14  CREATININE 0.72  CALCIUM 7.6*   PT/INR No results for input(s): LABPROT, INR in the last 72 hours. ABG No results for input(s): PHART, HCO3 in the last 72 hours.  Invalid input(s): PCO2, PO2  Studies/Results: No results found.  Anti-infectives: Anti-infectives (From admission, onward)   Start     Dose/Rate Route Frequency Ordered Stop   12/15/19 1400  neomycin (MYCIFRADIN) tablet 1,000 mg  Status:  Discontinued     1,000 mg Oral 3 times per day 12/15/19 0717 12/15/19 0723   12/15/19 1400  metroNIDAZOLE (FLAGYL) tablet 1,000 mg  Status:  Discontinued     1,000 mg Oral 3 times per day 12/15/19 0717 12/15/19 0723   12/15/19 0730  cefoTEtan (CEFOTAN) 2 g in sodium chloride 0.9 % 100 mL IVPB     2 g 200 mL/hr over 30 Minutes Intravenous On call to O.R. 12/15/19 0717 12/15/19 0915      Assessment/Plan: s/p Procedure(s): LAPAROSCOPIC ASSISTED RIGHT COLECTOMY (Right) Advance diet  Appreciate Pt eval Pod 3 snf vs rehab   LOS: 3 days    Autumn Messing III 12/18/2019

## 2019-12-18 NOTE — TOC Progression Note (Signed)
Transition of Care [facility]) - Progression Note    Patient Details  Name: Marissa Caldwell MRN: [id number] Date of Birth: April 21, 1932  Transition of Care [facility]) CM/SW [place], [place] Phone Number:  12/18/2019, 11:55 AM  Clinical Narrative:    CSW called and spoke with pt son Marissa Caldwell at [phone]. Pt son understands recommendations. Confirmed pt lives alone in a 55+ community, she has a cane but doesn't usually use it frequently. He is okay with referral to rehab facility and understands that if pt progresses then we can change the plan. Pt son states if she is able he would like for her to return home with [facility]. CSW briefly explained that they do not provide 24/7 assistance and instead work more like appointments throughout the week. CSW to provide information regarding SNF offers. Pt son expresses concern about her diet and having gas. CSW explained that it is up to Dr. Marlou Starks to determine when she is ready and it is best to start plans early before the weekend.   TOC team to provide CMS offers when available.    Expected Discharge Plan: [place] Barriers to Discharge: Continued Medical Work up, Ship broker  Expected Discharge Plan and Services Expected Discharge Plan: [place] In-house Referral: Clinical Social Work Discharge Planning Services: CM Consult Post Acute Care Choice: Museum/gallery conservator, [place] Living arrangements for the past 2 months: Apartment  Readmission Risk Interventions Readmission Risk Prevention Plan 12/17/2019  Transportation Screening Complete  PCP or Specialist Appt within 5-7 Days Complete  Home Care Screening Complete  Medication Review (RN CM) Referral to Pharmacy  Some recent data might be hidden

## 2019-12-18 NOTE — Progress Notes (Signed)
Initial Nutrition Assessment  DOCUMENTATION CODES:   Non-severe (moderate) malnutrition in context of chronic illness  INTERVENTION:   -Boost Breeze po TID, each supplement provides 250 kcal and 9 grams of protein -D/c Ensure Surgery, due to poor acceptance -Magic cup TID with meals, each supplement provides 290 kcal and 9 grams of protein -MVI with minerals daily  NUTRITION DIAGNOSIS:   Moderate Malnutrition related to cancer and cancer related treatments, chronic illness as evidenced by energy intake < 75% for > or equal to 1 month, mild fat depletion, moderate fat depletion, mild muscle depletion, moderate muscle depletion.  GOAL:   Patient will meet greater than or equal to 90% of their needs  MONITOR:   PO intake, Supplement acceptance, Diet advancement, Labs, Weight trends, Skin, I & O's  REASON FOR ASSESSMENT:   Malnutrition Screening Tool    ASSESSMENT:   86 year old female who presents for a follow-up for Colorectal cancer. The patient is an 86 year old white female who is 4 months status post right breast lumpectomy and sentinel node biopsy for a T1cN1a right breast cancer that was ER and PR positive and HER-2 negative with a Ki-67 of 10%.  Her workup included a PET scan which showed something in her right colon.  She then underwent colonoscopy which found a small mass in the right colon that was nonobstructing.  This was biopsied and came back as an adenocarcinoma  4/26- s/p Procedure(s): LAPAROSCOPIC ASSISTED RIGHT COLECTOMY (Right), advanced to clear liquid diet 4/27- advanced to full liquid diet  Reviewed I/O's: +204 ml x 24 hours and +1.5 L since admission  UOP: 300 ml x 24 hours  Spoke with pt and son at bedside. Pt reports that she had a fair appetite PTA and would often graze throughout the day. Pt shares that she was very faithful about drinking her meal replacement drink every night before bed. Pt son explained that "the doctors told her to eat chicken  and fish many years ago and she took it too far. She doesn't eat as much as she should".   Pt reports difficult eating for the past two days secondary to nausea and gas. She does not like the Ensure supplements, but amenable to [place]. She consumed only a few bites of grits this morning.   Pt shares that she has lost weight progressively over the years, but unsure of UBW. Wt has been stable over the past 6 months.   Confirmed with son that pt is on a full liquid diet. Discussed potential for diet advancement and what is included on a full liquid diet. Also discussed interventions in place for pt (Boost Breeze supplement) to help increase oral intake.   Labs reviewed: CBGS: 138 (inpatient orders for glycemic control are none).   NUTRITION - FOCUSED PHYSICAL EXAM:    Most Recent Value  Orbital Region  Moderate depletion  Upper Arm Region  Moderate depletion  Thoracic and Lumbar Region  Mild depletion  Buccal Region  Mild depletion  Temple Region  Moderate depletion  Clavicle Bone Region  Moderate depletion  Clavicle and Acromion Bone Region  Moderate depletion  Scapular Bone Region  Moderate depletion  Dorsal Hand  Moderate depletion  Patellar Region  Mild depletion  Anterior Thigh Region  Mild depletion  Posterior Calf Region  Mild depletion  Edema (RD Assessment)  Mild  Hair  Reviewed  Eyes  Reviewed  Mouth  Reviewed  Skin  Reviewed  Nails  Reviewed  Diet Order:   Diet Order            Diet full liquid Room service appropriate? Yes; Fluid consistency: Thin  Diet effective now              EDUCATION NEEDS:   Education needs have been addressed  Skin:  Skin Assessment: Skin Integrity Issues: Skin Integrity Issues:: Incisions Incisions: closed abdomen  Last BM:  12/17/19  Height:   Ht Readings from Last 1 Encounters:  12/15/19 5' 10"  (1.778 m)    Weight:   Wt Readings from Last 1 Encounters:  12/15/19 65.8 kg    Ideal Body Weight:  72.7 kg  BMI:   Body mass index is 20.81 kg/m.  Estimated Nutritional Needs:   Kcal:  1800-2000  Protein:  95-110 grams  Fluid:  > 1.8 L    Marissa Caldwell, RD, LDN, [place] Registered Dietitian II Certified Diabetes Care and Education Specialist Please refer to [facility] for RD and/or RD on-call/weekend/after hours pager

## 2019-12-19 NOTE — Progress Notes (Signed)
4 Days Post-Op   Subjective/Chief Complaint: No complaints. Got confused overnight   Objective: Vital signs in last 24 hours: Temp:  [98 F (36.7 C)-98.1 F (36.7 C)] 98 F (36.7 C) (04/30 0459) Pulse Rate:  [73-78] 78 (04/30 0459) Resp:  [16-18] 18 (04/30 0459) BP: (124-148)/(66-76) 124/76 (04/30 0459) SpO2:  [96 %-99 %] 98 % (04/30 0831) Last BM Date: 12/18/19  Intake/Output from previous day: 04/29 0701 - 04/30 0700 In: 780 [P.O.:780] Out: 550 [Urine:550] Intake/Output this shift: Total I/O In: 300 [P.O.:300] Out: -   General appearance: alert and cooperative Resp: clear to auscultation bilaterally Cardio: regular rate and rhythm GI: soft, nontender. good bs. incsion looks good  Lab Results:  No results for input(s): WBC, HGB, HCT, PLT in the last 72 hours. BMET No results for input(s): NA, K, CL, CO2, GLUCOSE, BUN, CREATININE, CALCIUM in the last 72 hours. PT/INR No results for input(s): LABPROT, INR in the last 72 hours. ABG No results for input(s): PHART, HCO3 in the last 72 hours.  Invalid input(s): PCO2, PO2  Studies/Results: No results found.  Anti-infectives: Anti-infectives (From admission, onward)   Start     Dose/Rate Route Frequency Ordered Stop   12/15/19 1400  neomycin (MYCIFRADIN) tablet 1,000 mg  Status:  Discontinued     1,000 mg Oral 3 times per day 12/15/19 0717 12/15/19 0723   12/15/19 1400  metroNIDAZOLE (FLAGYL) tablet 1,000 mg  Status:  Discontinued     1,000 mg Oral 3 times per day 12/15/19 0717 12/15/19 0723   12/15/19 0730  cefoTEtan (CEFOTAN) 2 g in sodium chloride 0.9 % 100 mL IVPB     2 g 200 mL/hr over 30 Minutes Intravenous On call to O.R. 12/15/19 0717 12/15/19 0915      Assessment/Plan: s/p Procedure(s): LAPAROSCOPIC ASSISTED RIGHT COLECTOMY (Right) Advance diet  Continue to work with PT Await snf vs rehab Pod 4  LOS: 4 days    Marissa Caldwell 12/19/2019

## 2019-12-19 NOTE — TOC Progression Note (Addendum)
Transition of Care [facility]) - Progression Note    Patient Details  Name: Marissa Caldwell MRN: [id number] Date of Birth: 1931/12/12  Transition of Care [facility]) CM/SW [place], [place] Phone Number: 12/19/2019, 10:38 AM  Clinical Narrative:    2:08pm- Pt RN Margaret Pyle this writer to alert me that pt now interested in [facility]. Await additional therapy assessment/recs and will facilitate that when pt diet advanced.   10:38am- CSW met with pt at bedside. Pt in good spirits but states she had a bad night and realized she needs at least a short stay at SNF. CSW provided support for her decision as it is recommended functionally by therapy team. CSW provided pt with CMS lists of offers for pt to review. Pt son to come by hospital later today. Explained a member of our staff would either stop by room or call son for top choices this weekend.   Pt will need a new COVID within 48 hrs of discharge. Insurance authorization dependent on pt choice.    Expected Discharge Plan: [place] Barriers to Discharge: Continued Medical Work up, Ship broker  Expected Discharge Plan and Services Expected Discharge Plan: [place] In-house Referral: Clinical Social Work Discharge Planning Services: CM Consult Post Acute Care Choice: Museum/gallery conservator, [place] Living arrangements for the past 2 months: Apartment  Readmission Risk Interventions Readmission Risk Prevention Plan 12/17/2019  Transportation Screening Complete  PCP or Specialist Appt within 5-7 Days Complete  Home Care Screening Complete  Medication Review (RN CM) Referral to Pharmacy  Some recent data might be hidden

## 2019-12-19 NOTE — Progress Notes (Signed)
PT Cancellation Note  Patient Details Name: Marissa Caldwell MRN: [id number] DOB: May 24, 1932   Cancelled Treatment:    Reason Eval/Treat Not Completed: Other (comment) Pt politely declining therapy services due to wanting to get washed up with NT.     Wyona Almas, PT, DPT Acute Rehabilitation Services Pager [phone] Office [phone]    Deno Etienne 12/19/2019, 4:14 PM

## 2019-12-20 MED ORDER — LORAZEPAM 0.5 MG PO TABS
0.5000 mg | ORAL_TABLET | Freq: Two times a day (BID) | ORAL | Status: DC
  Administered 2019-12-20 – 2019-12-21 (×4): 0.5 mg via ORAL
  Filled 2019-12-20 (×4): qty 1

## 2019-12-20 MED ORDER — LORAZEPAM 0.5 MG PO TABS: 0.5000 mg | ORAL_TABLET | Freq: Two times a day (BID) | ORAL | Status: DC

## 2019-12-20 NOTE — TOC Transition Note (Addendum)
Transition of Care [facility]) - CM/SW Discharge Note   Patient Details  Name: Marissa Caldwell MRN: [id number] Date of Birth: [birth year]  Transition of Care [facility]) CM/SW Contact:  Claudie Leach, RN 12/20/2019, 1:28 PM   Clinical Narrative:    Patient has elected to go home with [facility] services.  Discussed with patient's son, Josph Macho.  Patient will be home alone, but Josph Macho feels that she is mobilizing well enough that intermittent supervision is sufficient. Patient' granddaughter is close by to assist as needed also.    Patient requests RW.  [employer] will deliver to the room this afternoon.    Patient has had [facility] previously and wants a different agency.  Choices reviewed with son and he chooses [facility].  [facility] doesn't take insurance.  Referral accepted by Tommi Rumps with [facility] for PT, OT, HHA, and RN if needed.    Josph Macho states he believes the best time to d/c will be Monday. Josph Macho will be contact for [facility] agency as patient has trouble communicating on the phone due to hearing and vision deficits.     Final next level of care: [place] Barriers to Discharge: Continued Medical Work up   Patient Goals and CMS Choice Patient states their goals for this hospitalization and ongoing recovery are:: to get walking again CMS Medicare.gov Compare Post Acute Care list provided to:: Patient Choice offered to / list presented to : Patient, Adult Children  Discharge Plan and Services In-house Referral: Clinical Social Work Discharge Planning Services: CM Consult Post Acute Care Choice: Durable Medical Equipment, Home Health          DME Arranged: Walker rolling DME Agency: [facility] Date DME Agency Contacted: 12/20/19 Time DME Agency Contacted: [id number] Representative spoke with at DME Agency: Sherryle Lis HH Arranged: PT, OT, RN [place] Agency: [place] Date [place]: 12/20/19 Time [place]: [id number] Representative spoke with at [place]: [place]    Readmission Risk  Interventions Readmission Risk Prevention Plan 12/17/2019  Post Dischage Appt Not Complete  Appt Comments plan for SNF  Medication Screening Complete  Transportation Screening Complete  PCP or Specialist Appt within 5-7 Days Complete  Home Care Screening Complete  Medication Review (RN CM) Referral to Pharmacy  Some recent data might be hidden

## 2019-12-20 NOTE — Progress Notes (Signed)
5 Days Post-Op   Subjective/Chief Complaint: tol fulls, having bowel function, pain controlled   Objective: Vital signs in last 24 hours: Temp:  [97.6 F (36.4 C)-98.1 F (36.7 C)] 97.9 F (36.6 C) (05/01 0540) Pulse Rate:  [69-72] 69 (05/01 1000) Resp:  [15-20] 15 (05/01 0540) BP: (108-155)/(67-81) 155/81 (05/01 1000) SpO2:  [94 %-98 %] 95 % (05/01 0909) Last BM Date: 12/19/19  Intake/Output from previous day: 04/30 0701 - 05/01 0700 In: 801.2 [P.O.:777; I.V.:24.2] Out: -  Intake/Output this shift: Total I/O In: 200 [P.O.:200] Out: -   Resp: clear to auscultation bilaterally Cardio: regular rate and rhythm GI: soft bs present approp tender nondistended incision clean  Lab Results:  No results for input(s): WBC, HGB, HCT, PLT in the last 72 hours. BMET No results for input(s): NA, K, CL, CO2, GLUCOSE, BUN, CREATININE, CALCIUM in the last 72 hours. PT/INR No results for input(s): LABPROT, INR in the last 72 hours. ABG No results for input(s): PHART, HCO3 in the last 72 hours.  Invalid input(s): PCO2, PO2  Studies/Results: No results found.  Anti-infectives: Anti-infectives (From admission, onward)   Start     Dose/Rate Route Frequency Ordered Stop   12/15/19 1400  neomycin (MYCIFRADIN) tablet 1,000 mg  Status:  Discontinued     1,000 mg Oral 3 times per day 12/15/19 0717 12/15/19 0723   12/15/19 1400  metroNIDAZOLE (FLAGYL) tablet 1,000 mg  Status:  Discontinued     1,000 mg Oral 3 times per day 12/15/19 0717 12/15/19 0723   12/15/19 0730  cefoTEtan (CEFOTAN) 2 g in sodium chloride 0.9 % 100 mL IVPB     2 g 200 mL/hr over 30 Minutes Intravenous On call to O.R. 12/15/19 0717 12/15/19 0915      Assessment/Plan: POD 5 right colectomy -oral pain control -on regular dose ativan per her request -regular diet -await snf -check bmet tomorrow- last na several days ago was 127 -supplements -sq heparin per Dr Rozanna Boer 12/20/2019

## 2019-12-20 NOTE — TOC Progression Note (Signed)
Transition of Care [facility]) - Progression Note    Patient Details  Name: Marissa Caldwell MRN: [id number] Date of Birth: 05-29-1932  Transition of Care [facility]) CM/SW [place], [place] Phone Number: 12/20/2019, 10:11 AM  Clinical Narrative:     CSW spoke with patient's son Josph Macho regarding discharge planning and following up on SNF list given. Josph Macho reports they have decided to take patient home with home health services, agreeable for CSW to send out home health referrals pending acceptance. Josph Macho reports patient already has a 3in1 at home but will need a rolling walker and wants to ensure it's tall enough for patient who is 5'10.   CSW has informed RNCM to assist in identifying [facility] agency to take patient's insurance as well. RW to be delivered to room prior to dc, family reports they will pick patient up at time of discharge. Home address confirmed accurate in chart for home health therapies. Requesting home health PT, OT and RN.   Expected Discharge Plan: [place] Barriers to Discharge: Continued Medical Work up  Expected Discharge Plan and Services Expected Discharge Plan: [place] In-house Referral: Clinical Social Work Discharge Planning Services: CM Consult Post Acute Care Choice: Durable Medical Equipment, Home Health Living arrangements for the past 2 months: Apartment                 DME Arranged: Walker rolling DME Agency: [facility]       HH Arranged: PT, OT, RN           Social Determinants of Health (SDOH) Interventions    Readmission Risk Interventions Readmission Risk Prevention Plan 12/17/2019  Post Dischage Appt Not Complete  Appt Comments plan for SNF  Medication Screening Complete  Transportation Screening Complete  PCP or Specialist Appt within 5-7 Days Complete  Home Care Screening Complete  Medication Review (RN CM) Referral to Pharmacy  Some recent data might be hidden

## 2019-12-21 DIAGNOSIS — H9190 Unspecified hearing loss, unspecified ear: Secondary | ICD-10-CM

## 2019-12-21 LAB — BASIC METABOLIC PANEL
Anion gap: 9 (ref 5–15)
BUN: 9 mg/dL (ref 8–23)
CO2: 25 mmol/L (ref 22–32)
Calcium: 8.2 mg/dL — ABNORMAL LOW (ref 8.9–10.3)
Chloride: 99 mmol/L (ref 98–111)
Creatinine, Ser: 0.64 mg/dL (ref 0.44–1.00)
GFR calc Af Amer: >60 mL/min (ref >60)
GFR calc non Af Amer: >60 mL/min (ref >60)
Glucose, Bld: 156 mg/dL — ABNORMAL HIGH (ref 70–99)
Potassium: 2.9 mmol/L — ABNORMAL LOW (ref 3.5–5.1)
Sodium: 133 mmol/L — ABNORMAL LOW (ref 135–145)

## 2019-12-21 LAB — MAGNESIUM: Magnesium: 1.8 mg/dL (ref 1.7–2.4)

## 2019-12-21 MED ORDER — MENTHOL 3 MG MT LOZG: 1.0000 | LOZENGE | OROMUCOSAL | Status: DC | PRN

## 2019-12-21 MED ORDER — SIMETHICONE 40 MG/0.6ML PO SUSP
40.0000 mg | Freq: Four times a day (QID) | ORAL | Status: DC | PRN
  Filled 2019-12-21: qty 0.6

## 2019-12-21 MED ORDER — POLYETHYLENE GLYCOL 3350 17 G PO PACK
17.0000 g | PACK | Freq: Two times a day (BID) | ORAL | Status: DC
  Administered 2019-12-21 – 2019-12-22 (×3): 17 g via ORAL
  Filled 2019-12-21 (×3): qty 1

## 2019-12-21 MED ORDER — HYDROCORTISONE 1 % EX CREA
1.0000 "application " | TOPICAL_CREAM | Freq: Three times a day (TID) | CUTANEOUS | Status: DC | PRN
  Filled 2019-12-21: qty 28

## 2019-12-21 MED ORDER — MAGIC MOUTHWASH
15.0000 mL | Freq: Four times a day (QID) | ORAL | Status: DC | PRN
  Filled 2019-12-21: qty 15

## 2019-12-21 MED ORDER — PHENOL 1.4 % MT LIQD: 1.0000 | OROMUCOSAL | Status: DC | PRN

## 2019-12-21 MED ORDER — ALUM & MAG HYDROXIDE-SIMETH 200-200-20 MG/5ML PO SUSP: 30.0000 mL | Freq: Four times a day (QID) | ORAL | Status: DC | PRN

## 2019-12-21 MED ORDER — HYDROCORTISONE (PERIANAL) 2.5 % EX CREA
1.0000 "application " | TOPICAL_CREAM | Freq: Four times a day (QID) | CUTANEOUS | Status: DC | PRN
  Filled 2019-12-21: qty 28.35

## 2019-12-21 MED ORDER — LIP MEDEX EX OINT
1.0000 "application " | TOPICAL_OINTMENT | Freq: Two times a day (BID) | CUTANEOUS | Status: DC
  Administered 2019-12-21 – 2019-12-22 (×3): 1 via TOPICAL
  Filled 2019-12-21: qty 7

## 2019-12-21 MED ORDER — GUAIFENESIN-DM 100-10 MG/5ML PO SYRP: 10.0000 mL | ORAL_SOLUTION | ORAL | Status: DC | PRN

## 2019-12-21 MED ORDER — BISACODYL 10 MG RE SUPP: 10.0000 mg | Freq: Two times a day (BID) | RECTAL | Status: DC | PRN

## 2019-12-21 NOTE — Plan of Care (Signed)
  Problem: Education: Goal: Knowledge of General Education information will improve Description Including pain rating scale, medication(s)/side effects and non-pharmacologic comfort measures Outcome: Progressing   

## 2019-12-21 NOTE — Progress Notes (Signed)
Marissa Caldwell [id number] [birth year]  CARE TEAM:  PCP: [name], MD  Outpatient Care Team: Patient Care Team: [name], MD as PCP - General Skeet Latch, MD as PCP - Cardiology (Cardiology) Rockwell Germany, RN as Oncology Nurse Navigator Mauro Kaufmann, RN as Oncology Nurse Navigator Jovita Kussmaul, MD as Consulting Physician (General Surgery) [name], MD as Consulting Physician (Hematology) Eppie Gibson, MD as Attending Physician (Radiation Oncology)  Inpatient Treatment Team: Treatment Team: Attending Provider: Jovita Kussmaul, MD; Registered Nurse: Donzetta Matters, RN; Technician: Gasper Sells, NT; Technician: Glennon Hamilton, NT; Technician: Warden Fillers, NT; Registered Nurse: Vidal Schwalbe, RN; Student Nurse: Thornton Dales; Case Manager: Claudie Leach, RN; Social Worker: Alberteen Sam, LCSW   Problem List:   Active Problems:   Cancer of right colon [facility])   6 Days Post-Op  12/15/2019  Procedure(s): LAPAROSCOPIC ASSISTED RIGHT COLECTOMY    Assessment  Cancer of right colon- POD6 s/p laparoscopic assisted right colectomy  [facility] Stay = 6 days)  Plan: Patient overall feeling well. Pain controlled and tolerating advanced diet. She is motivated to go home. -Continue oral pain control -Continue regular ativan -Continue regular diet -Patient to be discharged home with home health. Patient discussed with case manager yesterday.  -VTE prophylaxis- SCDs, etc -mobilize as tolerated to help recovery  20 minutes spent in review, evaluation, examination, counseling, and coordination of care.  More than 50% of that time was spent in counseling.  12/21/2019    Subjective: (Chief complaint)  Patient denies pain, nausea, vomiting, fever, and chills.  Overall feeling well  Having bowel movements and passing flatus  Tolerating full diet  Objective:  Vital signs:  Vitals:   12/20/19 1406 12/20/19 2129 12/21/19  0524 12/21/19 0851  BP: (!) 115/57 112/61 134/69   Pulse: 64 67 67 70  Resp: 19 14 17 16   Temp: 98.1 F (36.7 C) 97.6 F (36.4 C) 98 F (36.7 C)   TempSrc: Oral Oral Oral   SpO2: 98% 95% 94% 94%  Weight:      Height:        Last BM Date: 12/20/19  Intake/Output   Yesterday:  05/01 0701 - 05/02 0700 In: 740 [P.O.:740] Out: -  This shift:  Total I/O In: 240 [P.O.:240] Out: -   Bowel function:  Flatus: YES  BM:  YES  Drain: (No drain)   Physical Exam:  General: Pt awake/alert in no acute distress Eyes: PERRL, normal EOM.  Sclera clear.  No icterus Neuro: CN II-XII intact w/o focal sensory/motor deficits. Lymph: No head/neck/groin lymphadenopathy Psych:  No delerium/psychosis/paranoia.  Oriented x 4 HENT: Normocephalic, Mucus membranes moist.  No thrush Neck: Supple, No tracheal deviation.  No obvious thyromegaly Chest: No pain to chest wall compression.  Good respiratory excursion.  No audible wheezing CV:  Pulses intact.  Regular rhythm.  No major extremity edema MS: Normal AROM mjr joints.  No obvious deformity  Abdomen: Soft.  Mildy distended.  Mildly tender at incisions only.  No evidence of peritonitis.  No incarcerated hernias.  Ext:  Wounds clean, dry, and intact. No deformity.  No mjr edema.  No cyanosis Skin: No petechiae / purpurea.  No major sores.  Warm and dry    Results:   Cultures: Recent Results (from the past 720 hour(s))  SARS CORONAVIRUS 2 (TAT 6-24 HRS) Nasopharyngeal Nasopharyngeal Swab     Status: None   Collection Time: 12/11/19 12:09 PM   Specimen: Nasopharyngeal  Swab  Result Value Ref Range Status   SARS Coronavirus 2 NEGATIVE NEGATIVE Final    Comment: (NOTE) SARS-CoV-2 target nucleic acids are NOT DETECTED. The SARS-CoV-2 RNA is generally detectable in upper and lower respiratory specimens during the acute phase of infection. Negative results do not preclude SARS-CoV-2 infection, do not rule out co-infections with other  pathogens, and should not be used as the sole basis for treatment or other patient management decisions. Negative results must be combined with clinical observations, patient history, and epidemiological information. The expected result is Negative. Fact Sheet for Patients: SugarRoll.be Fact Sheet for Healthcare Providers: https://www.woods-mathews.com/ This test is not yet approved or cleared by the [place] FDA and  has been authorized for detection and/or diagnosis of SARS-CoV-2 by FDA under an Emergency Use Authorization (EUA). This EUA will remain  in effect (meaning this test can be used) for the duration of the COVID-19 declaration under Section 56 4(b)(1) of the Act, 21 U.S.C. section 360bbb-3(b)(1), unless the authorization is terminated or revoked sooner. Performed at [facility], [place] [address], [place], [place] [address]     Labs: No results found for this or any previous visit (from the past 48 hour(s)).  Imaging / Studies: No results found.  Medications / Allergies: per chart  Antibiotics: Anti-infectives (From admission, onward)   Start     Dose/Rate Route Frequency Ordered Stop   12/15/19 1400  neomycin (MYCIFRADIN) tablet 1,000 mg  Status:  Discontinued     1,000 mg Oral 3 times per day 12/15/19 0717 12/15/19 0723   12/15/19 1400  metroNIDAZOLE (FLAGYL) tablet 1,000 mg  Status:  Discontinued     1,000 mg Oral 3 times per day 12/15/19 0717 12/15/19 0723   12/15/19 0730  cefoTEtan (CEFOTAN) 2 g in sodium chloride 0.9 % 100 mL IVPB     2 g 200 mL/hr over 30 Minutes Intravenous On call to O.R. 12/15/19 0717 12/15/19 0915        Note: Portions of this report may have been transcribed using voice recognition software. Every effort was made to ensure accuracy; however, inadvertent computerized transcription errors may be present.   Any transcriptional errors that result from this process are  unintentional.     Orbie Pyo, PA-S Patient was seen and evaluated in coordination with Dr.Steven Gross, MD     [address] [place],  [address] [phone] Main / Paging [phone] Fax Please see Amion for pager number, especial 5pm - 7am.

## 2019-12-22 DIAGNOSIS — E440 Moderate protein-calorie malnutrition: Secondary | ICD-10-CM | POA: Insufficient documentation

## 2019-12-22 MED ORDER — POTASSIUM CHLORIDE CRYS ER 20 MEQ PO TBCR
40.0000 meq | EXTENDED_RELEASE_TABLET | Freq: Once | ORAL | Status: AC
  Administered 2019-12-22: 12:00:00 40 meq via ORAL
  Filled 2019-12-22: qty 2

## 2019-12-22 MED ORDER — HYDROCODONE-ACETAMINOPHEN 5-325 MG PO TABS: 1.0000 | ORAL_TABLET | Freq: Four times a day (QID) | ORAL | 0 refills | Status: DC | PRN

## 2019-12-22 NOTE — Progress Notes (Signed)
7 Days Post-Op   Subjective/Chief Complaint: Complains of soreness but otherwise seems well. Decided to go home with home health PT   Objective: Vital signs in last 24 hours: Temp:  [97.8 F (36.6 C)-98.3 F (36.8 C)] 97.8 F (36.6 C) (05/03 0521) Pulse Rate:  [59-68] 68 (05/03 0902) Resp:  [15-20] 20 (05/03 0902) BP: (123-148)/(69-72) 148/72 (05/03 0521) SpO2:  [92 %-96 %] 92 % (05/03 0902) Weight:  [69.2 kg] 69.2 kg (05/03 0500) Last BM Date: 12/21/19  Intake/Output from previous day: 05/02 0701 - 05/03 0700 In: 720 [P.O.:720] Out: -  Intake/Output this shift: No intake/output data recorded.  General appearance: alert and cooperative Resp: clear to auscultation bilaterally Cardio: regular rate and rhythm GI: soft, nontender. good bs. incision looks good  Lab Results:  No results for input(s): WBC, HGB, HCT, PLT in the last 72 hours. BMET Recent Labs    12/21/19 1127  NA 133*  K 2.9*  CL 99  CO2 25  GLUCOSE 156*  BUN 9  CREATININE 0.64  CALCIUM 8.2*   PT/INR No results for input(s): LABPROT, INR in the last 72 hours. ABG No results for input(s): PHART, HCO3 in the last 72 hours.  Invalid input(s): PCO2, PO2  Studies/Results: No results found.  Anti-infectives: Anti-infectives (From admission, onward)   Start     Dose/Rate Route Frequency Ordered Stop   12/15/19 1400  neomycin (MYCIFRADIN) tablet 1,000 mg  Status:  Discontinued     1,000 mg Oral 3 times per day 12/15/19 0717 12/15/19 0723   12/15/19 1400  metroNIDAZOLE (FLAGYL) tablet 1,000 mg  Status:  Discontinued     1,000 mg Oral 3 times per day 12/15/19 0717 12/15/19 0723   12/15/19 0730  cefoTEtan (CEFOTAN) 2 g in sodium chloride 0.9 % 100 mL IVPB     2 g 200 mL/hr over 30 Minutes Intravenous On call to O.R. 12/15/19 0717 12/15/19 0915      Assessment/Plan: s/p Procedure(s): LAPAROSCOPIC ASSISTED RIGHT COLECTOMY (Right) Advance diet Discharge  LOS: 7 days    Marissa Caldwell 12/22/2019

## 2019-12-22 NOTE — Discharge Summary (Signed)
Physician Discharge Summary  Patient ID: Marissa Caldwell MRN: [id number] DOB/AGE: April 26, 1932 86 y.o.  Admit date: 12/15/2019 Discharge date: 12/22/2019  Admission Diagnoses:  Discharge Diagnoses:  Principal Problem:   Cancer of right colon [facility]) Active Problems:   Dyslipidemia   Hypothyroidism   COPD (chronic obstructive pulmonary disease) ([place])   Malignant neoplasm of upper-outer quadrant of right breast in female, estrogen receptor positive ([place])   HOH (hard of hearing)   Malnutrition of moderate degree   Discharged Condition: good  Hospital Course: The patient underwent laparoscopic assisted right colectomy for a colon cancer.  She tolerated the surgery well.  Her postoperative course was fairly uneventful.  Initially she did not feel strong enough to go home by herself so the process of sending her either to rehab or to a skilled nursing facility was started.  After several days of working with physical therapy in the hospital she felt well enough to go home on her own and was ready for discharge.  Consults: None  Significant Diagnostic Studies: None  Treatments: surgery: As above  Discharge Exam: Blood pressure (!) 148/72, pulse 68, temperature 97.8 F (36.6 C), temperature source Oral, resp. rate 20, height 5\' 10"  (1.778 m), weight 69.2 kg, SpO2 92 %. General appearance: alert and cooperative Resp: clear to auscultation bilaterally Cardio: regular rate and rhythm GI: Soft and nontender.  Good bowel sounds.  Incision looks good.  Disposition: Discharge disposition: 01-Home or Self Care       Discharge Instructions    Call MD for:  difficulty breathing, headache or visual disturbances   Complete by: As directed    Call MD for:  extreme fatigue   Complete by: As directed    Call MD for:  hives   Complete by: As directed    Call MD for:  persistant dizziness or light-headedness   Complete by: As directed    Call MD for:  persistant nausea and vomiting   Complete  by: As directed    Call MD for:  redness, tenderness, or signs of infection (pain, swelling, redness, odor or green/yellow discharge around incision site)   Complete by: As directed    Call MD for:  severe uncontrolled pain   Complete by: As directed    Call MD for:  temperature >100.4   Complete by: As directed    Diet - low sodium heart healthy   Complete by: As directed    Discharge instructions   Complete by: As directed    May shower. Diet as tolerated. No heavy lifting   Increase activity slowly   Complete by: As directed    No wound care   Complete by: As directed      Allergies as of 12/22/2019      Reactions   Lipitor [atorvastatin]    Elevated LFT's      Medication List    TAKE these medications   albuterol 108 (90 Base) MCG/ACT inhaler Commonly known as: VENTOLIN HFA Inhale 2 puffs into the lungs every 6 (six) hours as needed (for shortness of breath/wheezing.).   aspirin EC 81 MG tablet Take 81 mg by mouth daily.   brimonidine 0.2 % ophthalmic solution Commonly known as: ALPHAGAN Place 1 drop into the left eye 2 (two) times daily.   budesonide-formoterol 160-4.5 MCG/ACT inhaler Commonly known as: SYMBICORT Inhale 2 puffs into the lungs 2 (two) times daily.   Calcium + D3 600-200 MG-UNIT Tabs Take 1 tablet by mouth daily.   carvedilol 3.125  MG tablet Commonly known as: COREG TAKE 1 TABLET BY MOUTH TWICE DAILY What changed:   when to take this  additional instructions   CoQ10 100 MG Caps Take 100 mg by mouth daily.   FISH OIL PO Take 1 capsule by mouth daily.   HYDROcodone-acetaminophen 5-325 MG tablet Commonly known as: NORCO/VICODIN Take 1-2 tablets by mouth every 6 (six) hours as needed for moderate pain or severe pain.   latanoprost 0.005 % ophthalmic solution Commonly known as: XALATAN Place 1 drop into both eyes at bedtime.   levothyroxine 100 MCG tablet Commonly known as: SYNTHROID Take 100 mcg by mouth daily before breakfast.    LORazepam 0.5 MG tablet Commonly known as: ATIVAN Take 0.5 mg by mouth 2 (two) times daily.   LUTEIN PO Take 1 tablet by mouth daily.   metroNIDAZOLE 500 MG tablet Commonly known as: FLAGYL Take 1,000 mg by mouth See admin instructions. Take 2 tablets by mouth at 2pm, 3pm, and 10pm the day prior to colon surgery   mirtazapine 15 MG tablet Commonly known as: REMERON Take 15 mg by mouth at bedtime.   multivitamin tablet Take 1 tablet by mouth daily.   neomycin 500 MG tablet Commonly known as: MYCIFRADIN Take 1,000 mg by mouth See admin instructions. Take 2 tablets by mouth at 2pm, 3pm, and 10pm, the day prior to surgery   nitroGLYCERIN 0.4 MG SL tablet Commonly known as: NITROSTAT Place 1 tablet (0.4 mg total) under the tongue every 5 (five) minutes as needed for chest pain.   pravastatin 40 MG tablet Commonly known as: PRAVACHOL TAKE 1 TABLET BY MOUTH DAILY GENERIC EQUIVALENT FOR PRAVACHOL What changed: See the new instructions.   ROLAIDS PO Take 1-2 tablets by mouth daily as needed (acid reflux).   tamoxifen 20 MG tablet Commonly known as: NOLVADEX Take 1 tablet (20 mg total) by mouth daily.   Toviaz 4 MG Tb24 tablet Generic drug: fesoterodine Take 4 mg by mouth daily.   Vitamin D3 50 MCG (2000 UT) capsule Take 4,000 Units by mouth daily.            Durable Medical Equipment  (From admission, onward)         Start     Ordered   12/20/19 1055  For home use only DME Walker rolling  Once    Question Answer Comment  Walker: With 5 Inch Wheels   Patient needs a walker to treat with the following condition Weakness      12/20/19 1055         [facility]-up [place] Follow up.   Why: Agency will contact you to arrange home health appointments.  Contact information: [place] Rennert, MD Follow up in 2 week(s).   Specialty: General Surgery Why: for staple  removal Contact information: [address] [place] [address] [phone]           Signed: Autumn Messing III 12/22/2019, 3:49 PM

## 2019-12-22 NOTE — Care Management (Signed)
Received a call from [place] with [facility]. Patient discharged with no home health orders . Dr Marlou Starks unavailable through chat . Will call [facility] in morning. Orders need to be faxed to [facility] at [place]

## 2019-12-22 NOTE — Progress Notes (Signed)
Marissa Caldwell to be D/C'd per MD order. Discussed with the patient and all questions fully answered. ? VSS, Skin clean, dry and intact without evidence of skin break down, no evidence of skin tears noted. ? IV catheter discontinued intact. Site without signs and symptoms of complications. Dressing and pressure applied. ? An After Visit Summary was printed and given to the patient. Patient informed where to pickup prescriptions. ? D/c education completed with patient/family including follow up instructions, medication list, d/c activities limitations if indicated, with other d/c instructions as indicated by MD - patient able to verbalize understanding, all questions fully answered.  ? Patient instructed to return to ED, call 911, or call MD for any changes in condition.  ? Patient to be escorted via [place], and D/C home via private auto with son.

## 2019-12-29 ENCOUNTER — Other Ambulatory Visit: Payer: Self-pay | Admitting: General Surgery

## 2019-12-29 ENCOUNTER — Ambulatory Visit
Admission: RE | Admit: 2019-12-29 | Discharge: 2019-12-29 | Disposition: A | Discharge status: Home / Self Care | Payer: Medicare Other | Source: Ambulatory Visit | Attending: General Surgery | Admitting: General Surgery

## 2019-12-29 DIAGNOSIS — G8918 Other acute postprocedural pain: Secondary | ICD-10-CM

## 2019-12-29 DIAGNOSIS — R1084 Generalized abdominal pain: Secondary | ICD-10-CM

## 2019-12-29 DIAGNOSIS — K5730 Diverticulosis of large intestine without perforation or abscess without bleeding: Secondary | ICD-10-CM | POA: Diagnosis not present

## 2019-12-30 ENCOUNTER — Telehealth: Payer: Medicare Other | Admitting: Cardiology

## 2019-12-30 NOTE — Progress Notes (Signed)
[facility] Note  12/31/2019     CHIEF COMPLAINT Patient presents for Retina Follow Up   HISTORY OF PRESENT ILLNESS: Marissa Caldwell is a 86 y.o. female who presents to the clinic today for:   HPI    Retina Follow Up    Patient presents with  Wet AMD.  In right eye.  Severity is severe.  Duration of 3 months.  Since onset it is stable.  I, the attending physician,  performed the HPI with the patient and updated documentation appropriately.          Comments    3 month Exu Exam OU- Patient denies new problems OU, vision stable since last visit.  Pt did have sx for Colon CA x2 weeks ago.        Last edited by Bernarda Caffey, MD on 12/31/2019  2:53 PM. (History)    pt states her vision seems to be about the same, she states she was in the hospital a couple weeks ago and she was not getting her pressure drops (latanoprost and brimonidine per Dr. Kathlen Mody)  Referring physician: [name], MD [place],  [place] [address]  HISTORICAL INFORMATION:   Selected notes from the MEDICAL RECORD NUMBER Referred by Dr. Quentin Ore for concern of exudative ARMD OD   CURRENT MEDICATIONS: Current Outpatient Medications (Ophthalmic Drugs)  Medication Sig  . brimonidine (ALPHAGAN) 0.2 % ophthalmic solution Place 1 drop into the left eye 2 (two) times daily.   Marland Kitchen latanoprost (XALATAN) 0.005 % ophthalmic solution Place 1 drop into both eyes at bedtime.   Current Facility-Administered Medications (Ophthalmic Drugs)  Medication Route  . aflibercept (EYLEA) SOLN 2 mg Intravitreal  . aflibercept (EYLEA) SOLN 2 mg Intravitreal  . aflibercept (EYLEA) SOLN 2 mg Intravitreal  . aflibercept (EYLEA) SOLN 2 mg Intravitreal  . aflibercept (EYLEA) SOLN 2 mg Intravitreal   Current Outpatient Medications (Other)  Medication Sig  . albuterol (PROVENTIL HFA;VENTOLIN HFA) 108 (90 Base) MCG/ACT inhaler Inhale 2 puffs into the lungs every 6 (six) hours  as needed (for shortness of breath/wheezing.).   Marland Kitchen aspirin EC 81 MG tablet Take 81 mg by mouth daily.  . budesonide-formoterol (SYMBICORT) 160-4.5 MCG/ACT inhaler Inhale 2 puffs into the lungs 2 (two) times daily.  . Ca Carbonate-Mag Hydroxide (ROLAIDS PO) Take 1-2 tablets by mouth daily as needed (acid reflux).  . Calcium Carb-Cholecalciferol (CALCIUM + D3) 600-200 MG-UNIT TABS Take 1 tablet by mouth daily.  . carvedilol (COREG) 3.125 MG tablet TAKE 1 TABLET BY MOUTH TWICE DAILY (Patient taking differently: Take 3.125 mg by mouth See admin instructions. Take 3.125 in the morning and may take a second 3.125 mg dose in the evening as needed for SBP over 125)  . Cholecalciferol (VITAMIN D3) 2000 units capsule Take 4,000 Units by mouth daily.  . Coenzyme Q10 (COQ10) 100 MG CAPS Take 100 mg by mouth daily.  Marland Kitchen HYDROcodone-acetaminophen (NORCO/VICODIN) 5-325 MG tablet Take 1-2 tablets by mouth every 6 (six) hours as needed for moderate pain or severe pain.  Marland Kitchen levothyroxine (SYNTHROID, LEVOTHROID) 100 MCG tablet Take 100 mcg by mouth daily before breakfast.   . LORazepam (ATIVAN) 0.5 MG tablet Take 0.5 mg by mouth 2 (two) times daily.   . LUTEIN PO Take 1 tablet by mouth daily.  . metroNIDAZOLE (FLAGYL) 500 MG tablet Take 1,000 mg by mouth See admin instructions. Take 2 tablets by mouth at 2pm, 3pm, and 10pm the  day prior to colon surgery  . mirtazapine (REMERON) 15 MG tablet Take 15 mg by mouth at bedtime.  . Multiple Vitamin (MULTIVITAMIN) tablet Take 1 tablet by mouth daily.  Marland Kitchen neomycin (MYCIFRADIN) 500 MG tablet Take 1,000 mg by mouth See admin instructions. Take 2 tablets by mouth at 2pm, 3pm, and 10pm, the day prior to surgery  . nitroGLYCERIN (NITROSTAT) 0.4 MG SL tablet Place 1 tablet (0.4 mg total) under the tongue every 5 (five) minutes as needed for chest pain.  . Omega-3 Fatty Acids (FISH OIL PO) Take 1 capsule by mouth daily.  . pravastatin (PRAVACHOL) 40 MG tablet TAKE 1 TABLET BY MOUTH  DAILY GENERIC EQUIVALENT FOR PRAVACHOL (Patient taking differently: Take 40 mg by mouth daily. )  . tamoxifen (NOLVADEX) 20 MG tablet Take 1 tablet (20 mg total) by mouth daily.  . TOVIAZ 4 MG TB24 tablet Take 4 mg by mouth daily.    Current Facility-Administered Medications (Other)  Medication Route  . Bevacizumab (AVASTIN) SOLN 1.25 mg Intravitreal  . Bevacizumab (AVASTIN) SOLN 1.25 mg Intravitreal  . Bevacizumab (AVASTIN) SOLN 1.25 mg Intravitreal      REVIEW OF SYSTEMS: ROS    Positive for: Cardiovascular, Eyes, Respiratory   Negative for: Constitutional, Gastrointestinal, Neurological, Skin, Genitourinary, Musculoskeletal, HENT, Endocrine, Psychiatric, Allergic/Imm, Heme/Lymph   Last edited by Leonie Douglas, COA on 12/31/2019  2:17 PM. (History)       ALLERGIES Allergies  Allergen Reactions  . Lipitor [Atorvastatin]     Elevated LFT's    PAST MEDICAL HISTORY Past Medical History:  Diagnosis Date  . Anxiety   . Arthritis   . Breast cancer ([place])   . CAD (coronary artery disease)    a. 03/2017: 95% LCx stenosis --> PCI/DES placed  . Cataract   . Chest pain 03/27/2017  . Chronic diastolic heart failure ([place]) 06/25/2017  . Chronic lower back pain   . COPD (chronic obstructive pulmonary disease) ([place])   . Coronary artery disease   . Diverticulitis   . Diverticulosis   . GERD (gastroesophageal reflux disease)   . Glaucoma   . Heart failure, type unknown ([place]) 03/11/2017  . History of radiation therapy 08/05/19- 09/03/19   Right Breast/ SCV 16 fractions of 2.66 Gy each for a total of 42.56 Gy. Right breast boost 4 fractions of 2 Gy each to total 8 Gy.   Marland Kitchen Hyperlipidemia 03/11/2017  . Hypertension   . Hypothyroidism   . Macular degeneration    wet and dry per pt's son  . Pneumonia X 1   "years and years ago" (03/26/2017)  . Shortness of breath 03/11/2017  . Status post dilation of esophageal narrowing    Past Surgical History:  Procedure Laterality Date  . BILATERAL  OOPHORECTOMY Bilateral   . BREAST LUMPECTOMY WITH RADIOACTIVE SEED AND SENTINEL LYMPH NODE BIOPSY Right 06/19/2019   Procedure: RIGHT BREAST LUMPECTOMY WITH RADIOACTIVE SEED AND SENTINEL LYMPH NODE BIOPSY;  Surgeon: Jovita Kussmaul, MD;  Location: [facility];  Service: General;  Laterality: Right;  . CATARACT EXTRACTION    . CATARACT EXTRACTION W/ INTRAOCULAR LENS  IMPLANT, BILATERAL Bilateral   . COLOSTOMY     Greater than 10 yrs  . CORONARY ANGIOPLASTY WITH STENT PLACEMENT  03/26/2017  . LAPAROSCOPIC RIGHT COLECTOMY Right 12/15/2019   Procedure: LAPAROSCOPIC ASSISTED RIGHT COLECTOMY;  Surgeon: Jovita Kussmaul, MD;  Location: [facility];  Service: General;  Laterality: Right;  . LEFT HEART CATH AND CORONARY ANGIOGRAPHY N/A 03/26/2017   Procedure:  Left Heart Cath and Coronary Angiography;  Surgeon: Belva Crome, MD;  Location: [facility];  Service: Cardiovascular;  Laterality: N/A;  . NERVE SURGERY     "lump on my sciatic nerve was removed years ago"  . SHOULDER OPEN ROTATOR CUFF REPAIR Right   . THYROIDECTOMY, PARTIAL    . TONSILLECTOMY    . UPPER GASTROINTESTINAL ENDOSCOPY     > ten yrs    FAMILY HISTORY Family History  Problem Relation Age of Onset  . CAD Mother   . Cataracts Mother   . Coronary artery disease Father   . Leukemia Sister   . Amblyopia Neg Hx   . Blindness Neg Hx   . Diabetes Neg Hx   . Glaucoma Neg Hx   . Macular degeneration Neg Hx   . Retinal detachment Neg Hx   . Strabismus Neg Hx   . Retinitis pigmentosa Neg Hx   . Colon cancer Neg Hx   . Colon polyps Neg Hx   . Esophageal cancer Neg Hx   . Rectal cancer Neg Hx   . Stomach cancer Neg Hx     SOCIAL HISTORY Social History   Tobacco Use  . Smoking status: Current Some Day Smoker    Packs/day: 0.50    Years: 60.00    Pack years: 30.00    Types: Cigarettes  . Smokeless tobacco: Never Used  Substance Use Topics  . Alcohol use: Yes    Comment: twice a month  . Drug use: No         OPHTHALMIC  EXAM:  Base Eye Exam    Visual Acuity (Snellen - Linear)      Right Left   Dist cc CF at 2' 20/60 +1   Dist ph cc NI NI       Tonometry (Tonopen, 2:29 PM)      Right Left   Pressure 14 12       Pupils      Dark Light Shape React APD   Right 5 4 Round Brisk None   Left 4 3 Round Brisk None       Visual Fields (Counting fingers)      Left Right    Full Full       Extraocular Movement      Right Left    Full Full       Neuro/Psych    Oriented x3: Yes   Mood/Affect: Normal       Dilation    Both eyes: 1.0% Mydriacyl, 2.5% Phenylephrine @ 2:29 PM        Slit Lamp and Fundus Exam    Slit Lamp Exam      Right Left   Lids/Lashes Dermatochalasis - upper lid, Telangiectasia Dermatochalasis - upper lid, Telangiectasia   Conjunctiva/Sclera White and quiet White and quiet   Cornea Arcus, 2+ diffuse Punctate epithelial erosions, diffuse endo pigment, decreased TBUT, Krukenberg's spindle Arcus, 2-3+ diffuse Punctate epithelial erosions, mild corneal haze centrally, diffuse endo pigment, decreased TBUT, Krukenberg's spindle   Anterior Chamber Deep and quiet Deep and quiet   Iris Round and dilated Round and dilated   Lens Three piece PC IOL in good position, open PC Three piece PC IOL in good position, clear PC   Vitreous Vitreous syneresis, Posterior vitreous detachment, mild vitreous condensations Mild Vitreous syneresis, Posterior vitreous detachment       Fundus Exam      Right Left   Disc sharp rim, 360 Peripapillary atrophy,  Pallor 360 Peripapillary atrophy, trace temporal pallor, sharp rim   C/D Ratio 0.2 0.1   Macula Blunted foveal reflex, Drusen, RPE mottling and clumping, +PED, +CNVM, sub-retinal Disciform scar, punctate IRH nasal macula Blunted foveal reflex, Drusen, RPE mottling, clumping, and Atrophy, focal GA nasal macula--slightly increased   Vessels Vascular attenuation Vascular attenuation   Periphery Attached Attached        Refraction    Wearing Rx       Sphere Cylinder Axis   Right -1.00 +1.50 014   Left -1.25 +1.25 174       Manifest Refraction      Sphere Cylinder Axis Dist VA   Right       Left -1.75 +1.25 180 NI          IMAGING AND PROCEDURES  Imaging and Procedures for @TODAY @  OCT, Retina - OU - Both Eyes       Right Eye Quality was good. Central Foveal Thickness: 454. Progression has been stable. Findings include subretinal hyper-reflective material, choroidal neovascular membrane, retinal drusen , epiretinal membrane, disciform scar, abnormal foveal contour, pigment epithelial detachment (Persistent peripapillary / nasal macula IRF and SRF; persistent thick PED/SRHM/disciform scar).   Left Eye Quality was good. Central Foveal Thickness: 217. Progression has been stable. Findings include abnormal foveal contour, retinal drusen , no IRF, no SRF, outer retinal atrophy, pigment epithelial detachment (Mild interval progression of GA area in en face image).   Notes *Images captured and stored on drive  Diagnosis / Impression:  OD: Exudative AMD with significant submacular scar/SRHM- stable improvement in peripapillary / nasal macula IRF and SRF; persistent thick PED/SRHM/disciform scar OS: Non-exudative AMD w/ GA - stable from prior  Clinical management:  See below  Abbreviations: NFP - Normal foveal profile. CME - cystoid macular edema. PED - pigment epithelial detachment. IRF - intraretinal fluid. SRF - subretinal fluid. EZ - ellipsoid zone. ERM - epiretinal membrane. ORA - outer retinal atrophy. ORT - outer retinal tubulation. SRHM - subretinal hyper-reflective material         Intravitreal Injection, Pharmacologic Agent - OD - Right Eye       Time Out 12/31/2019. 2:44 PM. Confirmed correct patient, procedure, site, and patient consented.   Anesthesia Topical anesthesia was used. Anesthetic medications included Lidocaine 2%, Proparacaine 0.5%.   Procedure Preparation included 5% betadine to ocular  surface, eyelid speculum. A (32g) needle was used.   Injection:  2 mg aflibercept Alfonse Flavors) SOLN   NDC: [id number], Lot: [id number], Expiration date: 05/17/2020   Route: Intravitreal, Site: Right Eye, Waste: 0.05 mL  Post-op Post injection exam found visual acuity of at least counting fingers. The patient tolerated the procedure well. There were no complications. The patient received written and verbal post procedure care education.                 ASSESSMENT/PLAN:    ICD-10-CM   1. 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 Retinal edema  H35.81 OCT, Retina - OU - Both Eyes  4. Posterior vitreous detachment of both eyes  H43.813   5. Pigmentary glaucoma of both eyes, mild stage  H40.1331   6. Pseudophakia of both eyes  Z96.1     1. Exudative age related macular degeneration, OD  -  onset ~2 mos prior to presentation, waited for scheduled appt with Dr. Kathlen Mody  - S/P IVA OD #1 (05.20.19), #2 (07.02.19), #3 (07.30.19)  - S/P IVE OD #1 (08.27.19), #2 (09.24.19), #3 (11.05.19), #4 (12.17.19), #5 (02.21.20), #6 (05.05.20), # 7 (08.05.20), #8 (9.2.20), #9 (11.10.20), #10 (02.12.21)  - VA remains CF  - OCT with stable improvment in peripapillary / nasal macula IRF and SRF; persistent thick PED/SRHM/disciform scar  - discussed findings and guarded prognosis and treatment options -- pt wishes to continue maintenance therapy for preservation of peripheral vision  - switch in therapy to [facility] approved through [employer]  - recommend IVE OD #11 today (05.12.21) w/ f/u in 3 months for maintenance  - pt wishes to be treated with IVE OD  - RBA of procedure discussed, questions answered  - informed consent obtained  - Eylea informed  consent form signed and scanned on 11.10.2020  - see procedure note  - Eylea paperwork and benefits investigation started on 07.30.19 -- Approved for 2021 [employer]  - f/u in 3 months -- DFE/OCT/ possible injection  2. Age related macular degeneration, non-exudative, left eye  - mild progression of geographic atrophy on exam and OCT today  - The incidence, anatomy, and pathology of dry AMD, risk of progression, and the AREDS and AREDS 2 study including smoking risks discussed with patient.  - continue amsler grid monitoring  3. Retinal edema as above  4. PVD / vitreous syneresis OU  Discussed findings and prognosis  No RT or RD on 360 peripheral exam  Reviewed s/s of RT/RD  Strict return precautions for any such RT/RD signs/symptoms  5. Pigmentary glaucoma OU-   - using latanoprost OU qhs  - IOP good today  - under the expert care of Dr. Quentin Ore  - monitor  6. Pseudophakia OU  - s/p CE/IOL OU  - beautiful surgery, doing well  - monitor   Ophthalmic Meds Ordered this visit:  Meds ordered this encounter  Medications  . aflibercept (EYLEA) SOLN 2 mg       Return in about 3 months (around 04/01/2020) for f/u exu ARMD OD, DFE, OCT.  There are no Patient Instructions on file for this visit.   Explained the diagnoses, plan, and follow up with the patient and they expressed understanding.  Patient expressed understanding of the importance of proper follow up care.   This document serves as a record of services personally performed by Gardiner Sleeper, MD, PhD. It was created on their behalf by Ernest Mallick, OA, an ophthalmic assistant. The creation of this record is the provider's dictation and/or activities during the visit.    Electronically signed by: Ernest Mallick, OA 05.12.2021 9:42 PM  Gardiner Sleeper, M.D., Ph.D. Diseases & Surgery of the Retina and [place] 12/31/2019   I have reviewed the above documentation for accuracy and  completeness, and I agree with the above. Gardiner Sleeper, M.D., Ph.D. 12/31/19 9:42 PM    Abbreviations: M myopia (nearsighted); A astigmatism; H hyperopia (farsighted); P presbyopia; Mrx spectacle prescription;  CTL contact lenses; OD right eye; OS left eye; OU both eyes  XT exotropia; ET esotropia; PEK punctate epithelial keratitis; PEE punctate epithelial erosions; DES dry eye syndrome; MGD meibomian gland dysfunction; ATs artificial tears; PFAT's preservative free artificial tears; Barnes nuclear sclerotic cataract; PSC posterior subcapsular cataract; ERM epi-retinal membrane; PVD posterior vitreous detachment; RD retinal detachment; DM diabetes mellitus; DR diabetic retinopathy; NPDR non-proliferative diabetic  retinopathy; PDR proliferative diabetic retinopathy; CSME clinically significant macular edema; DME diabetic macular edema; dbh dot blot hemorrhages; CWS cotton wool spot; POAG primary open angle glaucoma; C/D cup-to-disc ratio; HVF humphrey visual field; GVF goldmann visual field; OCT optical coherence tomography; IOP intraocular pressure; BRVO Branch retinal vein occlusion; CRVO central retinal vein occlusion; CRAO central retinal artery occlusion; BRAO branch retinal artery occlusion; RT retinal tear; SB scleral buckle; PPV pars plana vitrectomy; VH Vitreous hemorrhage; PRP panretinal laser photocoagulation; IVK intravitreal kenalog; VMT vitreomacular traction; MH Macular hole;  NVD neovascularization of the disc; NVE neovascularization elsewhere; AREDS age related eye disease study; ARMD age related macular degeneration; POAG primary open angle glaucoma; EBMD epithelial/anterior basement membrane dystrophy; ACIOL anterior chamber intraocular lens; IOL intraocular lens; PCIOL posterior chamber intraocular lens; Phaco/IOL phacoemulsification with intraocular lens placement; [place] photorefractive keratectomy; LASIK laser assisted in situ keratomileusis; HTN hypertension; DM diabetes mellitus; COPD  chronic obstructive pulmonary disease

## 2019-12-31 ENCOUNTER — Encounter (INDEPENDENT_AMBULATORY_CARE_PROVIDER_SITE_OTHER): Payer: Self-pay | Admitting: Ophthalmology

## 2019-12-31 ENCOUNTER — Ambulatory Visit (INDEPENDENT_AMBULATORY_CARE_PROVIDER_SITE_OTHER): Payer: Medicare Other | Admitting: Ophthalmology

## 2019-12-31 ENCOUNTER — Other Ambulatory Visit: Payer: Self-pay

## 2019-12-31 DIAGNOSIS — H401331 Pigmentary glaucoma, bilateral, mild stage: Secondary | ICD-10-CM

## 2019-12-31 DIAGNOSIS — H353211 Exudative age-related macular degeneration, right eye, with active choroidal neovascularization: Secondary | ICD-10-CM | POA: Diagnosis not present

## 2019-12-31 DIAGNOSIS — H3581 Retinal edema: Secondary | ICD-10-CM

## 2019-12-31 DIAGNOSIS — H43813 Vitreous degeneration, bilateral: Secondary | ICD-10-CM

## 2019-12-31 DIAGNOSIS — Z961 Presence of intraocular lens: Secondary | ICD-10-CM

## 2019-12-31 DIAGNOSIS — H353122 Nonexudative age-related macular degeneration, left eye, intermediate dry stage: Secondary | ICD-10-CM

## 2019-12-31 MED ORDER — AFLIBERCEPT 2MG/0.05ML IZ SOLN FOR KALEIDOSCOPE
2.0000 mg | INTRAVITREAL | Status: AC | PRN
  Administered 2019-12-31: 2 mg via INTRAVITREAL

## 2020-01-01 ENCOUNTER — Telehealth (INDEPENDENT_AMBULATORY_CARE_PROVIDER_SITE_OTHER): Payer: Medicare Other | Admitting: Cardiology

## 2020-01-01 ENCOUNTER — Encounter: Payer: Self-pay | Admitting: Cardiology

## 2020-01-01 VITALS — BP 105/58 | HR 81

## 2020-01-01 DIAGNOSIS — C50411 Malignant neoplasm of upper-outer quadrant of right female breast: Secondary | ICD-10-CM

## 2020-01-01 DIAGNOSIS — E785 Hyperlipidemia, unspecified: Secondary | ICD-10-CM

## 2020-01-01 DIAGNOSIS — I2510 Atherosclerotic heart disease of native coronary artery without angina pectoris: Secondary | ICD-10-CM

## 2020-01-01 DIAGNOSIS — Z9861 Coronary angioplasty status: Secondary | ICD-10-CM

## 2020-01-01 DIAGNOSIS — Z170 Estrogen receptor positive status [ER+]: Secondary | ICD-10-CM

## 2020-01-01 DIAGNOSIS — J449 Chronic obstructive pulmonary disease, unspecified: Secondary | ICD-10-CM

## 2020-01-01 DIAGNOSIS — H9190 Unspecified hearing loss, unspecified ear: Secondary | ICD-10-CM

## 2020-01-01 DIAGNOSIS — C182 Malignant neoplasm of ascending colon: Secondary | ICD-10-CM

## 2020-01-01 MED ORDER — NITROGLYCERIN 0.4 MG SL SUBL: 0.4000 mg | SUBLINGUAL_TABLET | SUBLINGUAL | 3 refills | Status: AC | PRN

## 2020-01-01 NOTE — Patient Instructions (Signed)
Medication Instructions:  The current medical regimen is effective;  continue present plan and medications as directed. Please refer to the Current Medication list given to you today. *If you need a refill on your cardiac medications before your next appointment, please call your pharmacy*  Follow-Up: Your next appointment:  6 month(s) Please call our office 2 months in advance to schedule this appointment In Person with You may see Skeet Latch, MD or one of the following Advanced Practice Providers on your designated Care Team:  Kerin Ransom, PA-C  [place], [place]  Coletta Memos, [place], you and your health needs are our priority.  As part of our continuing mission to provide you with exceptional heart care, we have created designated Provider Care Teams.  These Care Teams include your primary Cardiologist (physician) and Advanced Practice Providers (APPs -  Physician Assistants and Nurse Practitioners) who all work together to provide you with the care you need, when you need it. We recommend signing up for the patient portal called "MyChart".  Sign up information is provided on this After Visit Summary.  MyChart is used to connect with patients for Virtual Visits (Telemedicine).  Patients are able to view lab/test results, encounter notes, upcoming appointments, etc.  Non-urgent messages can be sent to your provider as well.   To learn more about what you can do with MyChart, go to [email or web address].

## 2020-01-01 NOTE — Progress Notes (Signed)
Virtual Visit via Telephone Note   This visit type was conducted due to national recommendations for restrictions regarding the COVID-19 Pandemic (e.g. social distancing) in an effort to limit this patient's exposure and mitigate transmission in our community.  Due to her co-morbid illnesses, this patient is at least at moderate risk for complications without adequate follow up.  This format is felt to be most appropriate for this patient at this time.  The patient did not have access to video technology/had technical difficulties with video requiring transitioning to audio format only (telephone).  All issues noted in this document were discussed and addressed.  No physical exam could be performed with this format.  Please refer to the patient's chart for her  consent to telehealth for [facility].   The patient was identified using 2 identifiers.  Date:  01/01/2020   ID:  Marissa Caldwell, DOB 02-Jan-1932, MRN [id number]  Patient Location: Home Provider Location: Home  PCP:  [name], MD  Cardiologist:  Skeet Latch, MD  Electrophysiologist:  None   Evaluation Performed:  Follow-Up Visit  Chief Complaint:  None- weak post hospital  History of Present Illness:    Marissa Caldwell is a 86 y.o. female with a hx ofCAD, status post distal circumflex PCI with DES August 2018 after an abnormal Myoview. I believe she originally presented with diastolic heart failure in July 2018. Other medical issues include COPD, hard of hearing, and dyslipidemia.  She was seen in the office 02/27/2019.  She related an episode of SSCP while out with a friend. She had also been recently diagnosed with breast cancer and needed pre op clearance.  Myoview done 05/08/2019 was low risk and she was cleared for surgery which she had Oct 2020.  Since then she has been diagnosed with colon cancer and had Rt colectomy 12/15/2019.  She was hospitalized until 12/22/2019.  Her course was fairly unremarkable.  Rehab  was recommended (she lives alone) but she declined.   She was contacted today for routine cardiac follow up.  She tells me she has been weak since her surgery.  She denies chest pain.  She has a HHRN coming to check on her.  She also told me she was taken of her Coreg secondary to hypotension and she says she feels better off it- less orthostatic.   The patient does not have symptoms concerning for COVID-19 infection (fever, chills, cough, or new shortness of breath).    Past Medical History:  Diagnosis Date  . Anxiety   . Arthritis   . Breast cancer ([place])   . CAD (coronary artery disease)    a. 03/2017: 95% LCx stenosis --> PCI/DES placed  . Cataract   . Chest pain 03/27/2017  . Chronic diastolic heart failure ([place]) 06/25/2017  . Chronic lower back pain   . Colon cancer ([place]) 11/2019   s/p colectomy  . COPD (chronic obstructive pulmonary disease) ([place])   . Coronary artery disease   . Diverticulitis   . Diverticulosis   . GERD (gastroesophageal reflux disease)   . Glaucoma   . Heart failure, type unknown ([place]) 03/11/2017  . History of radiation therapy 08/05/19- 09/03/19   Right Breast/ SCV 16 fractions of 2.66 Gy each for a total of 42.56 Gy. Right breast boost 4 fractions of 2 Gy each to total 8 Gy.   Marland Kitchen Hyperlipidemia 03/11/2017  . Hypertension   . Hypothyroidism   . Macular degeneration    wet and dry per pt's  son  . Pneumonia X 1   "years and years ago" (03/26/2017)  . Shortness of breath 03/11/2017  . Status post dilation of esophageal narrowing    Past Surgical History:  Procedure Laterality Date  . BILATERAL OOPHORECTOMY Bilateral   . BREAST LUMPECTOMY WITH RADIOACTIVE SEED AND SENTINEL LYMPH NODE BIOPSY Right 06/19/2019   Procedure: RIGHT BREAST LUMPECTOMY WITH RADIOACTIVE SEED AND SENTINEL LYMPH NODE BIOPSY;  Surgeon: Jovita Kussmaul, MD;  Location: [place];  Service: General;  Laterality: Right;  . CATARACT EXTRACTION    . CATARACT EXTRACTION W/ INTRAOCULAR LENS  IMPLANT,  BILATERAL Bilateral   . COLOSTOMY     Greater than 10 yrs  . CORONARY ANGIOPLASTY WITH STENT PLACEMENT  03/26/2017  . LAPAROSCOPIC RIGHT COLECTOMY Right 12/15/2019   Procedure: LAPAROSCOPIC ASSISTED RIGHT COLECTOMY;  Surgeon: Jovita Kussmaul, MD;  Location: [place];  Service: General;  Laterality: Right;  . LEFT HEART CATH AND CORONARY ANGIOGRAPHY N/A 03/26/2017   Procedure: Left Heart Cath and Coronary Angiography;  Surgeon: Belva Crome, MD;  Location: [place] [facility];  Service: Cardiovascular;  Laterality: N/A;  . NERVE SURGERY     "lump on my sciatic nerve was removed years ago"  . SHOULDER OPEN ROTATOR CUFF REPAIR Right   . THYROIDECTOMY, PARTIAL    . TONSILLECTOMY    . UPPER GASTROINTESTINAL ENDOSCOPY     > ten yrs     Current Meds  Medication Sig  . albuterol (PROVENTIL HFA;VENTOLIN HFA) 108 (90 Base) MCG/ACT inhaler Inhale 2 puffs into the lungs every 6 (six) hours as needed (for shortness of breath/wheezing.).   Marland Kitchen aspirin EC 81 MG tablet Take 81 mg by mouth daily.  . brimonidine (ALPHAGAN) 0.2 % ophthalmic solution Place 1 drop into the left eye 2 (two) times daily.   . budesonide-formoterol (SYMBICORT) 160-4.5 MCG/ACT inhaler Inhale 2 puffs into the lungs 2 (two) times daily.  . Calcium Carb-Cholecalciferol (CALCIUM + D3) 600-200 MG-UNIT TABS Take 1 tablet by mouth daily.  . Cholecalciferol (VITAMIN D3) 2000 units capsule Take 4,000 Units by mouth daily.  . Coenzyme Q10 (COQ10) 100 MG CAPS Take 100 mg by mouth daily.  Marland Kitchen latanoprost (XALATAN) 0.005 % ophthalmic solution Place 1 drop into both eyes at bedtime.  Marland Kitchen levothyroxine (SYNTHROID, LEVOTHROID) 100 MCG tablet Take 100 mcg by mouth daily before breakfast.   . LORazepam (ATIVAN) 0.5 MG tablet Take 0.5 mg by mouth 2 (two) times daily.   . LUTEIN PO Take 1 tablet by mouth daily.  . mirtazapine (REMERON) 15 MG tablet Take 15 mg by mouth at bedtime.  . Multiple Vitamin (MULTIVITAMIN) tablet Take 1 tablet by mouth daily.  .  Omega-3 Fatty Acids (FISH OIL PO) Take 1 capsule by mouth daily.  . pravastatin (PRAVACHOL) 40 MG tablet TAKE 1 TABLET BY MOUTH DAILY GENERIC EQUIVALENT FOR PRAVACHOL (Patient taking differently: Take 40 mg by mouth daily. )  . tamoxifen (NOLVADEX) 20 MG tablet Take 1 tablet (20 mg total) by mouth daily.  . TOVIAZ 4 MG TB24 tablet Take 4 mg by mouth daily.   . [DISCONTINUED] nitroGLYCERIN (NITROSTAT) 0.4 MG SL tablet Place 1 tablet (0.4 mg total) under the tongue every 5 (five) minutes as needed for chest pain.   Current Facility-Administered Medications for the 01/01/20 encounter (Telemedicine) with Erlene Quan, PA-C  Medication  . aflibercept (EYLEA) SOLN 2 mg  . aflibercept (EYLEA) SOLN 2 mg  . aflibercept (EYLEA) SOLN 2 mg  . aflibercept (EYLEA)  SOLN 2 mg  . aflibercept (EYLEA) SOLN 2 mg  . Bevacizumab (AVASTIN) SOLN 1.25 mg  . Bevacizumab (AVASTIN) SOLN 1.25 mg  . Bevacizumab (AVASTIN) SOLN 1.25 mg     Allergies:   Lipitor [atorvastatin]   Social History   Tobacco Use  . Smoking status: Current Some Day Smoker    Packs/day: 0.50    Years: 60.00    Pack years: 30.00    Types: Cigarettes  . Smokeless tobacco: Never Used  Substance Use Topics  . Alcohol use: Yes    Comment: twice a month  . Drug use: No     Family Hx: The patient's family history includes CAD in her mother; Cataracts in her mother; Coronary artery disease in her father; Leukemia in her sister. There is no history of Amblyopia, Blindness, Diabetes, Glaucoma, Macular degeneration, Retinal detachment, Strabismus, Retinitis pigmentosa, Colon cancer, Colon polyps, Esophageal cancer, Rectal cancer, or Stomach cancer.  ROS:   Please see the history of present illness.    All other systems reviewed and are negative.   Prior CV studies:   The following studies were reviewed today:  Myoview 05/08/2019-  Nuclear stress EF: 67%.  No T wave inversion was noted during stress.  There was no ST segment  deviation noted during stress.  This is a low risk study  Labs/Other Tests and Data Reviewed:    EKG:  An ECG dated 02/27/2019 was personally reviewed today and demonstrated:  NSR-HR 70  Recent Labs: 07/03/2019: TSH 0.630 09/11/2019: ALT 11 12/16/2019: Hemoglobin 10.8; Platelets 228 12/21/2019: BUN 9; Creatinine, Ser 0.64; Magnesium 1.8; Potassium 2.9; Sodium 133   Recent Lipid Panel Lab Results  Component Value Date/Time   CHOL 202 (H) 02/19/2019 11:15 AM   TRIG 83 02/19/2019 11:15 AM   HDL 71 02/19/2019 11:15 AM   CHOLHDL 2.8 02/19/2019 11:15 AM   LDLCALC 114 (H) 02/19/2019 11:15 AM    Wt Readings from Last 3 Encounters:  12/22/19 152 lb 8.9 oz (69.2 kg)  12/11/19 145 lb 3.2 oz (65.9 kg)  10/15/19 144 lb (65.3 kg)     Objective:    Vital Signs:  BP (!) 105/58   Pulse 81    VITAL SIGNS:  reviewed  ASSESSMENT & PLAN:    S/P Rt colectomy 12/15/2019- recovering at home  CAD S/P percutaneous coronary angioplasty S/P angioplasty with DES 03/26/17 to dLCX. Myoview Sept 2020 low risk.  Dyslipidemia Elevated LFTs on Lipitor-changed to Pravastatin in the past secondary to elevated LFTs. Leave off statin for now.   Invasive breast cancer Diagnosed 03/31/2019- surgery Oct 2020.  Symptomatic hypotension- Coreg stopped- this may need to be resumed in the future   COVID-19 Education: The signs and symptoms of COVID-19 were discussed with the patient and how to seek care for testing (follow up with PCP or arrange E-visit).  The importance of social distancing was discussed today.  Time:   Today, I have spent 22 minutes with the patient with telehealth technology discussing the above problems and reviewing her medical records.     Medication Adjustments/Labs and Tests Ordered: Current medicines are reviewed at length with the patient today.  Concerns regarding medicines are outlined above.   Tests Ordered: No orders of the defined types were placed in this  encounter.   Medication Changes: Meds ordered this encounter  Medications  . nitroGLYCERIN (NITROSTAT) 0.4 MG SL tablet    Sig: Place 1 tablet (0.4 mg total) under the tongue every 5 (five) minutes as  needed for chest pain.    Dispense:  25 tablet    Refill:  3    Follow Up:  In Person in two months with Dr Oval Linsey  Signed, Kerin Ransom, PA-C  01/01/2020 11:39 AM    [place]

## 2020-01-05 ENCOUNTER — Ambulatory Visit: Payer: Medicare Other | Admitting: Hematology

## 2020-01-07 NOTE — Progress Notes (Signed)
[facility]   [phone] [phone]   Clinic Follow up Note   Patient Care Team: White, Cynthia, MD as PCP - General Lacon, Tiffany, MD as PCP - Cardiology (Cardiology) Martini, Keisha N, RN as Oncology Nurse Navigator Stuart, Dawn C, RN as Oncology Nurse Navigator Toth, Paul III, MD as Consulting Physician (General Surgery) Feng, Yan, MD as Consulting Physician (Hematology) Squire, Sarah, MD as Attending Physician (Radiation Oncology)  Date of Service:  01/08/2020  CHIEF COMPLAINT: F/u of right breast cancer  SUMMARY OF ONCOLOGIC HISTORY: Oncology History Overview Note  Cancer Staging Cancer of right colon (HCC) Staging form: Colon and Rectum, AJCC 8th Edition - Pathologic stage from 12/15/2019: Stage I (pT2, pN0, cM0) - Signed by Feng, Yan, MD on 01/08/2020  Malignant neoplasm of upper-outer quadrant of right breast in female, estrogen receptor positive (HCC) Staging form: Breast, AJCC 8th Edition - Clinical: Stage IIA (cT1c, cN1, cM0, G3, ER+, PR+, HER2-) - Signed by Feng, Yan, MD on 04/09/2019 - Pathologic stage from 06/19/2019: Stage IB (pT1c, pN2a, cM0, G2, ER+, PR+, HER2-) - Signed by Feng, Yan, MD on 07/03/2019    Malignant neoplasm of upper-outer quadrant of right breast in female, estrogen receptor positive (HCC)  03/19/2019 Mammogram   Diagnostic Mammogram 03/19/19  IMPRESSION The 2 cm distortion in the  right breast 9:30 position middle depth 3cm from the nipple is indeterminate. US recommended.  There is also a 1.2cmx3.2cm enlarged right axillary LN highly suggestive of malignancy.     03/31/2019 Initial Biopsy   Diagnosis 03/31/19  1. Breast, right, needle core biopsy, 9:30 o'clock, mid depth, 3cmfn - INVASIVE DUCTAL CARCINOMA. - LYMPHOVASCULAR INVASION IS IDENTIFIED. - SEE COMMENT. 2. Lymph node, needle/core biopsy, right axilla - INVASIVE DUCTAL CARCINOMA. - SEE COMMENT.   03/31/2019 Receptors her2   1. PROGNOSTIC  INDICATORS Results: IMMUNOHISTOCHEMICAL AND MORPHOMETRIC ANALYSIS PERFORMED MANUALLY The tumor cells are NEGATIVE for Her2 (0). Estrogen Receptor: 100%, POSITIVE, STRONG STAINING INTENSITY Progesterone Receptor: 10%, POSITIVE, STRONG STAINING INTENSITY Proliferation Marker Ki67: 10%  2. PROGNOSTIC INDICATORS Results: IMMUNOHISTOCHEMICAL AND MORPHOMETRIC ANALYSIS PERFORMED MANUALLY Estrogen Receptor: 100%, POSITIVE, STRONG STAINING INTENSITY Progesterone Receptor: 0%, NEGATIVE Proliferation Marker Ki67: 40%   04/03/2019 Initial Diagnosis   Malignant neoplasm of upper-outer quadrant of right breast in female, estrogen receptor positive (HCC)   04/09/2019 Cancer Staging   Staging form: Breast, AJCC 8th Edition - Clinical: Stage IIA (cT1c, cN1, cM0, G3, ER+, PR+, HER2-) - Signed by Feng, Yan, MD on 04/09/2019  Staging form: Breast, AJCC 8th Edition - Clinical: Stage IIA (cT1c, cN1, cM0, G3, ER+, PR+, HER2-) - Signed by Feng, Yan, MD on 04/09/2019   03/2019 -  Anti-estrogen oral therapy   Tamoxifen 20mg daily starting 03/2019. Will hold starting 05/30/19 for surgery. Restarted in 07/2019   06/19/2019 Surgery   RIGHT BREAST LUMPECTOMY WITH RADIOACTIVE SEED AND SENTINEL LYMPH NODE BIOPSY by Dr Toth  06/19/19    06/19/2019 Pathology Results   FINAL MICROSCOPIC DIAGNOSIS: 06/19/19  A. BREAST, RIGHT, LUMPECTOMY:  -  Mucinous carcinoma, Nottingham grade 2  -  Margins uninvolved by carcinoma (0.2 cm; medial margin)  -  Lymphovascular space invasion present  -  Previous biopsy site changes present  -  See oncology table and comment below   B. LYMPH NODE, RIGHT AXILLARY #1, SENTINEL, BIOPSY:  -  Metastatic carcinoma involving two lymph nodes (2/2)  -  Extracapsular extension present  -  See comment   C. BREAST, RIGHT MEDIAL MARGIN, EXCISION:  -    Residual invasive carcinoma  -  Usual ductal hyperplasia with calcifications  -  See comment   D. LYMPH NODE, RIGHT AXILLARY #2, SENTINEL,  BIOPSY:  -  Metastatic carcinoma involving one lymph node (1/1)   E. LYMPH NODE, RIGHT AXILLARY #1 , BIOPSY:  -  Metastatic carcinoma involving one lymph node (1/1)   F. LYMPH NODE, RIGHT AXILLARY #2, BIOPSY:  -  No carcinoma identified in one lymph node (0/1)    06/19/2019 Cancer Staging   Staging form: Breast, AJCC 8th Edition - Pathologic stage from 06/19/2019: Stage IB (pT1c, pN2a, cM0, G2, ER+, PR+, HER2-) - Signed by Feng, Yan, MD on 07/03/2019   07/22/2019 PET scan   IMPRESSION: 1. Low level hypermetabolism identified in the surgical beds of the right breast and right axilla. This is presumably related to the surgery. 2. Tiny right subpectoral lymph nodes are asymmetric in concerning for metastatic disease on CT imaging. No hypermetabolism on the PET study although size of these lymph nodes is below reliable resolution on PET imaging. 3. 10 mm short axis precarinal lymph node shows low level hypermetabolism. Metastatic involvement not excluded. 4. 11 mm ground-glass nodule in right upper lobe shows low level FDG accumulation. Well differentiated or low-grade neoplasm is a distinct concern. Close follow-up recommended. 5. Focal hypermetabolism in the right colon, potentially related to the ileocecal valve but incompletely characterized. Adenoma or carcinoma could have this appearance. Correlation with colorectal cancer screening history recommended. 6. Focal hypermetabolism identified in the region of the left pubic bone and adjacent sigmoid colon. No underlying bony or colonic lesion evident. Metastatic involvement of the left pubic bone not excluded. 7.  Aortic Atherosclerois (ICD10-170.0)     08/05/2019 - 09/03/2019 Radiation Therapy   Adjuvant RT with Dr Squire 08/05/19-09/03/19.    Cancer of right colon (HCC)  10/15/2019 Procedure   Colonoscopy by Dr Nandigam  IMPRESSION - Likely malignant tumor in the proximal ascending colon. Biopsied. Tattooed. - Severe  diverticulosis in the sigmoid colon, in the descending colon, in the transverse colon and in the ascending colon. - Non-bleeding internal hemorrhoids.   10/15/2019 Initial Biopsy   Diagnosis Ascending Colon Polyp, mass - ADENOCARCINOMA. Microscopic Comment IHC for MMR will be reported separately. Results reported to Dr. Kavitha Nandigam on 10/16/2019. Intradepartmental consultation (Dr. Kashikar).   12/15/2019 Initial Diagnosis   Cancer of right colon (HCC)   12/15/2019 Cancer Staging   Staging form: Colon and Rectum, AJCC 8th Edition - Pathologic stage from 12/15/2019: Stage I (pT2, pN0, cM0) - Signed by Feng, Yan, MD on 01/08/2020   12/15/2019 Surgery   LAPAROSCOPIC ASSISTED RIGHT COLECTOMY by Dr Toth and Dr Thomas   12/15/2019 Pathology Results   FINAL MICROSCOPIC DIAGNOSIS:   A. COLON, TERMINAL ILEUM AND RIGHT, COLECTOMY:  - Invasive moderately differentiated adenocarcinoma, 4.6 cm, involving  ascending colon  - Carcinoma invades into deeper muscularis propria but not beyond  - Resection margins are negative for carcinoma  - Negative for lymphovascular or perineural invasion  - Unremarkable appendix  - Nineteen lymph nodes, negative for carcinoma (0/19)  - See oncology table    12/15/2019 Genetic Testing   ADDENDUM:   Mismatch Repair Protein (IHC)  SUMMARY INTERPRETATION: ABNORMAL  There is loss of the major and minor MMR proteins MLH1 and PMS2. The  loss of expression may be secondary to promoter hyper-methylation, gene  mutation or other genetic event. BRAF mutation testing and/or MLH1  methylation testing is indicated. The presence of a BRAF mutation and/or    MLH1 hypermethylation is indicative of a sporadic-type tumor. The  absence of either BRAF mutation and/or presence of normal methylation  indicate the possible presence of a hereditary germline mutation (e.g.  Lynch syndrome) and referral to genetic counseling is warranted. It is  recommended that the loss of  protein expression be correlated with  molecular based MSI testing.   IHC EXPRESSION RESULTS  TEST           RESULT  MLH1:          LOSS OF NUCLEAR EXPRESSION  MSH2:          Preserved nuclear expression  MSH6:          Preserved nuclear expression  PMS2:          LOSS OF NUCLEAR EXPRESSION    12/29/2019 Imaging   CT AP W contrast  IMPRESSION: 1. Sigmoid diverticulosis without inflammation. Status post right colectomy. 2. Multiple small calcified uterine fibroids. 3. No acute abnormality seen in the abdomen or pelvis.   Aortic Atherosclerosis (ICD10-I70.0).        CURRENT THERAPY:  Tamoxifen started before surgery in 03/2019  INTERVAL HISTORY:  Marissa Caldwell is here for a follow up of right breast cancer. She presents to the clinic with her son. S/p colon surgery she notes she does not feel well and she has diffuse abdominal pain which started last week. She notes her stool is jelly like consistency after having watery stool. 10 days ago she stopped taking Miralax due to diarrhea. She notes she is eating moderately. She notes her pain is intermittent. She has not tried any pain medication for this but is interested in taking something.     REVIEW OF SYSTEMS:   Constitutional: Denies fevers, chills or abnormal weight loss (+) Fatigue  Eyes: Denies blurriness of vision Ears, nose, mouth, throat, and face: Denies mucositis or sore throat Respiratory: Denies cough, dyspnea or wheezes Cardiovascular: Denies palpitation, chest discomfort or lower extremity swelling Gastrointestinal:  Denies nausea, heartburn or change in bowel habits (+) Loose stool output (+) Diffuse abdominal pain, mainly in RLQ.  Skin: Denies abnormal skin rashes Lymphatics: Denies new lymphadenopathy or easy bruising Neurological:Denies numbness, tingling or new weaknesses Behavioral/Psych: Mood is stable, no new changes  All other systems were reviewed with the patient and are negative.  MEDICAL HISTORY:    Past Medical History:  Diagnosis Date  . Anxiety   . Arthritis   . Breast cancer (HCC)   . CAD (coronary artery disease)    a. 03/2017: 95% LCx stenosis --> PCI/DES placed  . Cataract   . Chest pain 03/27/2017  . Chronic diastolic heart failure (HCC) 06/25/2017  . Chronic lower back pain   . Colon cancer (HCC) 11/2019   s/p colectomy  . COPD (chronic obstructive pulmonary disease) (HCC)   . Coronary artery disease   . Diverticulitis   . Diverticulosis   . GERD (gastroesophageal reflux disease)   . Glaucoma   . Heart failure, type unknown (HCC) 03/11/2017  . History of radiation therapy 08/05/19- 09/03/19   Right Breast/ SCV 16 fractions of 2.66 Gy each for a total of 42.56 Gy. Right breast boost 4 fractions of 2 Gy each to total 8 Gy.   . Hyperlipidemia 03/11/2017  . Hypertension   . Hypothyroidism   . Macular degeneration    wet and dry per pt's son  . Pneumonia X 1   "years and years ago" (03/26/2017)  . Shortness of breath 03/11/2017  .   Status post dilation of esophageal narrowing     SURGICAL HISTORY: Past Surgical History:  Procedure Laterality Date  . BILATERAL OOPHORECTOMY Bilateral   . BREAST LUMPECTOMY WITH RADIOACTIVE SEED AND SENTINEL LYMPH NODE BIOPSY Right 06/19/2019   Procedure: RIGHT BREAST LUMPECTOMY WITH RADIOACTIVE SEED AND SENTINEL LYMPH NODE BIOPSY;  Surgeon: Toth, Paul III, MD;  Location: [facility] OR;  Service: General;  Laterality: Right;  . CATARACT EXTRACTION    . CATARACT EXTRACTION W/ INTRAOCULAR LENS  IMPLANT, BILATERAL Bilateral   . COLOSTOMY     Greater than 10 yrs  . CORONARY ANGIOPLASTY WITH STENT PLACEMENT  03/26/2017  . LAPAROSCOPIC RIGHT COLECTOMY Right 12/15/2019   Procedure: LAPAROSCOPIC ASSISTED RIGHT COLECTOMY;  Surgeon: Toth, Paul III, MD;  Location: [facility] OR;  Service: General;  Laterality: Right;  . LEFT HEART CATH AND CORONARY ANGIOGRAPHY N/A 03/26/2017   Procedure: Left Heart Cath and Coronary Angiography;  Surgeon: Smith, Henry W, MD;  Location:  [facility] INVASIVE CV LAB;  Service: Cardiovascular;  Laterality: N/A;  . NERVE SURGERY     "lump on my sciatic nerve was removed years ago"  . SHOULDER OPEN ROTATOR CUFF REPAIR Right   . THYROIDECTOMY, PARTIAL    . TONSILLECTOMY    . UPPER GASTROINTESTINAL ENDOSCOPY     > ten yrs    I have reviewed the social history and family history with the patient and they are unchanged from previous note.  ALLERGIES:  is allergic to lipitor [atorvastatin].  MEDICATIONS:  Current Outpatient Medications  Medication Sig Dispense Refill  . albuterol (PROVENTIL HFA;VENTOLIN HFA) 108 (90 Base) MCG/ACT inhaler Inhale 2 puffs into the lungs every 6 (six) hours as needed (for shortness of breath/wheezing.).     . aspirin EC 81 MG tablet Take 81 mg by mouth daily.    . brimonidine (ALPHAGAN) 0.2 % ophthalmic solution Place 1 drop into the left eye 2 (two) times daily.     . budesonide-formoterol (SYMBICORT) 160-4.5 MCG/ACT inhaler Inhale 2 puffs into the lungs 2 (two) times daily.    . Calcium Carb-Cholecalciferol (CALCIUM + D3) 600-200 MG-UNIT TABS Take 1 tablet by mouth daily.    . Cholecalciferol (VITAMIN D3) 2000 units capsule Take 4,000 Units by mouth daily.    . Coenzyme Q10 (COQ10) 100 MG CAPS Take 100 mg by mouth daily.    . latanoprost (XALATAN) 0.005 % ophthalmic solution Place 1 drop into both eyes at bedtime.    . levothyroxine (SYNTHROID, LEVOTHROID) 100 MCG tablet Take 100 mcg by mouth daily before breakfast.   0  . LORazepam (ATIVAN) 0.5 MG tablet Take 0.5 mg by mouth 2 (two) times daily.     . LUTEIN PO Take 1 tablet by mouth daily.    . mirtazapine (REMERON) 15 MG tablet Take 15 mg by mouth at bedtime.    . Multiple Vitamin (MULTIVITAMIN) tablet Take 1 tablet by mouth daily.    . nitroGLYCERIN (NITROSTAT) 0.4 MG SL tablet Place 1 tablet (0.4 mg total) under the tongue every 5 (five) minutes as needed for chest pain. 25 tablet 3  . Omega-3 Fatty Acids (FISH OIL PO) Take 1 capsule by mouth daily.     . pravastatin (PRAVACHOL) 40 MG tablet TAKE 1 TABLET BY MOUTH DAILY GENERIC EQUIVALENT FOR PRAVACHOL (Patient taking differently: Take 40 mg by mouth daily. ) 90 tablet 1  . tamoxifen (NOLVADEX) 20 MG tablet Take 1 tablet (20 mg total) by mouth daily. 90 tablet 3  . TOVIAZ 4 MG TB24   tablet Take 4 mg by mouth daily.   1  . traMADol (ULTRAM) 50 MG tablet Take 1 tablet (50 mg total) by mouth every 6 (six) hours as needed. 30 tablet 0   Current Facility-Administered Medications  Medication Dose Route Frequency Provider Last Rate Last Admin  . aflibercept (EYLEA) SOLN 2 mg  2 mg Intravitreal  Bernarda Caffey, MD   2 mg at 04/16/18 1424  . aflibercept (EYLEA) SOLN 2 mg  2 mg Intravitreal  Bernarda Caffey, MD   2 mg at 05/14/18 1358  . aflibercept (EYLEA) SOLN 2 mg  2 mg Intravitreal  Bernarda Caffey, MD   2 mg at 06/25/18 1433  . aflibercept (EYLEA) SOLN 2 mg  2 mg Intravitreal  Bernarda Caffey, MD   2 mg at 08/07/18 1557  . aflibercept (EYLEA) SOLN 2 mg  2 mg Intravitreal  Bernarda Caffey, MD   2 mg at 10/11/18 1522  . Bevacizumab (AVASTIN) SOLN 1.25 mg  1.25 mg Intravitreal  Bernarda Caffey, MD   1.25 mg at 01/07/18 1429  . Bevacizumab (AVASTIN) SOLN 1.25 mg  1.25 mg Intravitreal  Bernarda Caffey, MD   1.25 mg at 02/19/18 1356  . Bevacizumab (AVASTIN) SOLN 1.25 mg  1.25 mg Intravitreal  Bernarda Caffey, MD   1.25 mg at 03/19/18 1347    PHYSICAL EXAMINATION: ECOG PERFORMANCE STATUS: 2  Vitals:   01/08/20 1346  BP: 136/71  Pulse: 94  Resp: 18  Temp: (!) 97.2 F (36.2 C)  SpO2: 99%   Filed Weights   01/08/20 1346  Weight: 139 lb (63 kg)    GENERAL:alert, no distress and comfortable SKIN: skin color, texture, turgor are normal, no rashes or significant lesions EYES: normal, Conjunctiva are pink and non-injected, sclera clear  NECK: supple, thyroid normal size, non-tender, without nodularity LYMPH:  no palpable lymphadenopathy in the cervical, axillary  LUNGS: clear to auscultation and percussion  with normal breathing effort HEART: regular rate & rhythm and no murmurs and no lower extremity edema ABDOMEN:abdomen soft, non-tender and normal bowel sounds Musculoskeletal:no cyanosis of digits and no clubbing (+) Midline surgical incision healed well, tender with mild scar tissue. (+) Diffuse mild tenderness of abdomen NEURO: alert & oriented x 3 with fluent speech, no focal motor/sensory deficits BREAST: S/p right lumpectomy: Surgical incision healed well. No palpable mass, nodules or adenopathy bilaterally. Breast exam benign.   LABORATORY DATA:  I have reviewed the data as listed CBC Latest Ref Rng & Units 01/08/2020 12/16/2019 12/11/2019  WBC 4.0 - 10.5 K/uL 5.8 7.3 5.6  Hemoglobin 12.0 - 15.0 g/dL 12.4 10.8(L) 12.2  Hematocrit 36.0 - 46.0 % 37.8 32.7(L) 37.6  Platelets 150 - 400 K/uL 354 228 236     CMP Latest Ref Rng & Units 01/08/2020 12/21/2019 12/16/2019  Glucose 70 - 99 mg/dL 99 156(H) 123(H)  BUN 8 - 23 mg/dL _0 Creatinine 0.44 - 1.00 mg/dL 0.74 0.64 0.72  Sodium 135 - 145 mmol/L 138 133(L) 127(L)  Potassium 3.5 - 5.1 mmol/L 3.4(L) 2.9(L) 3.5  Chloride 98 - 111 mmol/L 99 99 98  CO2 22 - 32 mmol/L 29 25 21(L)  Calcium 8.9 - 10.3 mg/dL 9.6 8.2(L) 7.6(L)  Total Protein 6.5 - 8.1 g/dL 7.4 - -  Total Bilirubin 0.3 - 1.2 mg/dL 0.3 - -  Alkaline Phos 38 - 126 U/L 58 - -  AST 15 - 41 U/L 16 - -  ALT 0 - 44 U/L 11 - -  RADIOGRAPHIC STUDIES: I have personally reviewed the radiological images as listed and agreed with the findings in the report. No results found.   ASSESSMENT & PLAN:  Marissa Caldwell is a 86 y.o. female with   1. Cancer of right colon  -She was diagnosed in 09/2019 by colonoscopy with Dr Silverio Decamp.  -She underwent right colectomy on 12/15/19 with Dr. Marlou Starks. 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 Negative margins and LNs.  -Her CT AP from 12/29/19 was NED.  -Since surgery she has  had diffuse abdominal pain (mostly in RLQ) and diarrhea (now jelly like stool). This has effected her appetite, sleep and overall energy. She did require blood transfusion from surgery.  -Her physical exam showed her incision is healing well. For her pain I recommend she start Tylenol and if not enough she can use Tramadol (01/08/20). She is agreeable.  -I also encouraged her to work on weight gain.  -I will f/u with her in 3 months and she will f/u with Dr. Marlou Starks in interim.    2.Malignant neoplasm of upper-outer quadrant of right breast,invasive ductal carcinoma,pT1cN2aMx, ER/PR+, HER2-,Grade II -She wasdiagnosedin 03/2019. She has invasiveductalcarcinoma which has spreadto herlocalLN.  -She underwent right breast lumpectomy with targeted lymph node dissection by Dr Marlou Starks on 06/19/19. Her pathology showed her tumor was completely removed, she had clear margins but 4 positive lymph nodes.  -To reduce her risk of local cancer recurrence she underwent adjuvant RT with Dr. Isidore Moos 08/05/19-09/03/19.  -She started Tamoxifen before surgery in 03/2019. She stopped right before surgery and restarted in 07/2019. Tolerating very well, will continue for 5-10 years.  -From a breast cancer standpoint she is doing well. Breast exam benign. There is no concern for recurrence.  -Continue Tamoxifen and surveillance. Next mammogram in 02/2020.   3. CAD, HTN, Thyroid dysfunction -She has had a cardiac stent placed in the past  -She is on Coreg, nitro, pravastatin and other meds for her heartand thyroid medication -She will continue to be closely followed by her cardiologist. -Recent Dizziness, likely due to dehydration drop in BP. I recommend she increase water intake and salt in diet.    4. COPD, Long Term smoking, Ground-glass inflammation -On inhalers, not on home oxygen -She still smokes occasionally. She has not completely quit.  -PET from 12/1/120 shows 66m ground-glass nodules in RU lung,  f/u recommended in 2-3 months  5. Underweight -After drastic change in diet she had lowered appetite and lost significant weight over a year. She has started to gain some of that weight back.  -She is still able to be independent and take care of herself and her home. She lives alone with good support and help from her son who stays 45 minutes away from her. -Her weight previously improved from breast cancer but after recent colon surgery she lost weight. She is no longer on Mirtazapine. I reviewed diet and nutritional supplement use to help her gain weight back.    6. Osteoporosis, Arthritis  -She receives Proliainjectionsq619monthwith Dr. WhDema SeverinHas recently been denied by insurance. If not approved I discussed option of Orla Fosamax, she can discuss this with Dr. WhDema Severin -She had fracture of her wristsin 2019 -Her joint pain is mainly in her left hand and lower back when she stands for a long time.Stable    7. Macular Degeneration of both eyes, hearingloss -Managed by her Ophthalmologist   8.Anxiety/depression  -OnZoloft, mood stable -We discussed thatzoloft has very  mild interaction with Tamoxifen, will monitor for now  9. Hypokalemia  -Her K was at 2.9 on 12/21/19 after her colon surgery.  -Will repeat lab today and if still low will start her on oral potassium.   PLAN: -I called in Tramadol today  -Lab today -Mammogram in 02/2020  -Continue Tamoxifen  -Lab and F/u in 3 months    No problem-specific Assessment & Plan notes found for this encounter.   Orders Placed This Encounter  Procedures  . MM DIAG BREAST TOMO BILATERAL    Standing Status:   Future    Standing Expiration Date:   01/07/2021    Order Specific Question:   Reason for Exam (SYMPTOM  OR DIAGNOSIS REQUIRED)    Answer:   screening    Order Specific Question:   Preferred imaging location?    Answer:   [facility]   All questions were answered. The patient knows to call the clinic  with any problems, questions or concerns. No barriers to learning was detected. The total time spent in the appointment was 30 minutes.     Yan Feng, MD 01/08/2020   I, Amoya Bennett, am acting as scribe for Yan Feng, MD.   I have reviewed the above documentation for accuracy and completeness, and I agree with the above.       

## 2020-01-08 ENCOUNTER — Other Ambulatory Visit: Payer: Self-pay

## 2020-01-08 ENCOUNTER — Telehealth: Payer: Self-pay | Admitting: Hematology

## 2020-01-08 ENCOUNTER — Inpatient Hospital Stay: Payer: Medicare Other

## 2020-01-08 ENCOUNTER — Inpatient Hospital Stay: Payer: Medicare Other | Attending: Hematology | Admitting: Hematology

## 2020-01-08 VITALS — BP 136/71 | HR 94 | Temp 97.2°F | Resp 18 | Ht 70.0 in | Wt 139.0 lb

## 2020-01-08 DIAGNOSIS — M810 Age-related osteoporosis without current pathological fracture: Secondary | ICD-10-CM | POA: Diagnosis not present

## 2020-01-08 DIAGNOSIS — Z8781 Personal history of (healed) traumatic fracture: Secondary | ICD-10-CM | POA: Insufficient documentation

## 2020-01-08 DIAGNOSIS — R636 Underweight: Secondary | ICD-10-CM | POA: Diagnosis not present

## 2020-01-08 DIAGNOSIS — I10 Essential (primary) hypertension: Secondary | ICD-10-CM | POA: Insufficient documentation

## 2020-01-08 DIAGNOSIS — C50411 Malignant neoplasm of upper-outer quadrant of right female breast: Secondary | ICD-10-CM | POA: Insufficient documentation

## 2020-01-08 DIAGNOSIS — E876 Hypokalemia: Secondary | ICD-10-CM | POA: Insufficient documentation

## 2020-01-08 DIAGNOSIS — I2510 Atherosclerotic heart disease of native coronary artery without angina pectoris: Secondary | ICD-10-CM | POA: Diagnosis not present

## 2020-01-08 DIAGNOSIS — R42 Dizziness and giddiness: Secondary | ICD-10-CM | POA: Diagnosis not present

## 2020-01-08 DIAGNOSIS — C182 Malignant neoplasm of ascending colon: Secondary | ICD-10-CM | POA: Diagnosis not present

## 2020-01-08 DIAGNOSIS — F419 Anxiety disorder, unspecified: Secondary | ICD-10-CM | POA: Insufficient documentation

## 2020-01-08 DIAGNOSIS — Z170 Estrogen receptor positive status [ER+]: Secondary | ICD-10-CM

## 2020-01-08 DIAGNOSIS — F329 Major depressive disorder, single episode, unspecified: Secondary | ICD-10-CM | POA: Diagnosis not present

## 2020-01-08 DIAGNOSIS — Z720 Tobacco use: Secondary | ICD-10-CM | POA: Insufficient documentation

## 2020-01-08 DIAGNOSIS — J449 Chronic obstructive pulmonary disease, unspecified: Secondary | ICD-10-CM | POA: Diagnosis not present

## 2020-01-08 DIAGNOSIS — E079 Disorder of thyroid, unspecified: Secondary | ICD-10-CM | POA: Insufficient documentation

## 2020-01-08 DIAGNOSIS — Z79810 Long term (current) use of selective estrogen receptor modulators (SERMs): Secondary | ICD-10-CM | POA: Insufficient documentation

## 2020-01-08 DIAGNOSIS — H3530 Unspecified macular degeneration: Secondary | ICD-10-CM | POA: Diagnosis not present

## 2020-01-08 DIAGNOSIS — M1990 Unspecified osteoarthritis, unspecified site: Secondary | ICD-10-CM | POA: Insufficient documentation

## 2020-01-08 LAB — CBC WITH DIFFERENTIAL (CANCER CENTER ONLY)
Abs Immature Granulocytes: 0.02 10*3/uL (ref 0.00–0.07)
Basophils Absolute: 0 10*3/uL (ref 0.0–0.1)
Basophils Relative: 1 %
Eosinophils Absolute: 0.2 10*3/uL (ref 0.0–0.5)
Eosinophils Relative: 4 %
HCT: 37.8 % (ref 36.0–46.0)
Hemoglobin: 12.4 g/dL (ref 12.0–15.0)
Immature Granulocytes: 0 %
Lymphocytes Relative: 15 %
Lymphs Abs: 0.9 10*3/uL (ref 0.7–4.0)
MCH: 31.1 pg (ref 26.0–34.0)
MCHC: 32.8 g/dL (ref 30.0–36.0)
MCV: 94.7 fL (ref 80.0–100.0)
Monocytes Absolute: 1.1 10*3/uL — ABNORMAL HIGH (ref 0.1–1.0)
Monocytes Relative: 18 %
Neutro Abs: 3.6 10*3/uL (ref 1.7–7.7)
Neutrophils Relative %: 62 %
Platelet Count: 354 10*3/uL (ref 150–400)
RBC: 3.99 MIL/uL (ref 3.87–5.11)
RDW: 13.3 % (ref 11.5–15.5)
WBC Count: 5.8 10*3/uL (ref 4.0–10.5)
nRBC: 0 % (ref 0.0–0.2)

## 2020-01-08 LAB — CMP (CANCER CENTER ONLY)
ALT: 11 U/L (ref 0–44)
AST: 16 U/L (ref 15–41)
Albumin: 3.2 g/dL — ABNORMAL LOW (ref 3.5–5.0)
Alkaline Phosphatase: 58 U/L (ref 38–126)
Anion gap: 10 (ref 5–15)
BUN: 20 mg/dL (ref 8–23)
CO2: 29 mmol/L (ref 22–32)
Calcium: 9.6 mg/dL (ref 8.9–10.3)
Chloride: 99 mmol/L (ref 98–111)
Creatinine: 0.74 mg/dL (ref 0.44–1.00)
GFR, Est AFR Am: >60 mL/min (ref >60)
GFR, Estimated: >60 mL/min (ref >60)
Glucose, Bld: 99 mg/dL (ref 70–99)
Potassium: 3.4 mmol/L — ABNORMAL LOW (ref 3.5–5.1)
Sodium: 138 mmol/L (ref 135–145)
Total Bilirubin: 0.3 mg/dL (ref 0.3–1.2)
Total Protein: 7.4 g/dL (ref 6.5–8.1)

## 2020-01-08 MED ORDER — TRAMADOL HCL 50 MG PO TABS: 50.0000 mg | ORAL_TABLET | Freq: Four times a day (QID) | ORAL | 0 refills | Status: DC | PRN

## 2020-01-08 NOTE — Telephone Encounter (Signed)
Scheduled appt per 5/20 los - gave son appt date and time with print out .

## 2020-01-12 ENCOUNTER — Encounter: Payer: Self-pay | Admitting: Hematology

## 2020-01-27 ENCOUNTER — Other Ambulatory Visit: Payer: Self-pay | Admitting: General Surgery

## 2020-01-27 ENCOUNTER — Other Ambulatory Visit: Payer: Self-pay | Admitting: Emergency Medicine

## 2020-01-27 DIAGNOSIS — C182 Malignant neoplasm of ascending colon: Secondary | ICD-10-CM | POA: Diagnosis not present

## 2020-01-28 ENCOUNTER — Encounter (HOSPITAL_COMMUNITY): Payer: Self-pay

## 2020-01-28 ENCOUNTER — Other Ambulatory Visit: Payer: Self-pay

## 2020-01-28 ENCOUNTER — Ambulatory Visit (HOSPITAL_COMMUNITY)
Admission: RE | Admit: 2020-01-28 | Discharge: 2020-01-28 | Disposition: A | Discharge status: Home / Self Care | Payer: Medicare Other | Source: Ambulatory Visit | Attending: General Surgery | Admitting: General Surgery

## 2020-01-28 DIAGNOSIS — R197 Diarrhea, unspecified: Secondary | ICD-10-CM | POA: Diagnosis not present

## 2020-01-28 DIAGNOSIS — C182 Malignant neoplasm of ascending colon: Secondary | ICD-10-CM | POA: Insufficient documentation

## 2020-01-28 DIAGNOSIS — R1031 Right lower quadrant pain: Secondary | ICD-10-CM | POA: Diagnosis not present

## 2020-01-28 DIAGNOSIS — G8918 Other acute postprocedural pain: Secondary | ICD-10-CM | POA: Diagnosis not present

## 2020-01-28 MED ORDER — IOHEXOL 300 MG/ML  SOLN
100.0000 mL | Freq: Once | INTRAMUSCULAR | Status: AC | PRN
  Administered 2020-01-28: 100 mL via INTRAVENOUS

## 2020-01-28 MED ORDER — IOHEXOL 9 MG/ML PO SOLN
ORAL | Status: AC
  Filled 2020-01-28: qty 1000

## 2020-01-28 MED ORDER — SODIUM CHLORIDE (PF) 0.9 % IJ SOLN
INTRAMUSCULAR | Status: AC
  Filled 2020-01-28: qty 50

## 2020-02-12 DIAGNOSIS — H353122 Nonexudative age-related macular degeneration, left eye, intermediate dry stage: Secondary | ICD-10-CM | POA: Diagnosis not present

## 2020-02-12 DIAGNOSIS — H401331 Pigmentary glaucoma, bilateral, mild stage: Secondary | ICD-10-CM | POA: Diagnosis not present

## 2020-02-12 DIAGNOSIS — H353211 Exudative age-related macular degeneration, right eye, with active choroidal neovascularization: Secondary | ICD-10-CM | POA: Diagnosis not present

## 2020-02-24 DIAGNOSIS — Z006 Encounter for examination for normal comparison and control in clinical research program: Secondary | ICD-10-CM

## 2020-02-24 NOTE — Research (Signed)
[employer] Research study 3 year telephone follow up completed. Patient denies any Serious adverse events,  or changes in medication.  She has recently been diagnosed with colon cancer and underwent a right colectomy 12/15/2019 and was discharged on 12-22-2019.Marland Kitchen  She is asymptomatic with cardiac symptoms today. Next research required telephone follow up is due 1 year from now.

## 2020-03-18 ENCOUNTER — Other Ambulatory Visit: Payer: Self-pay | Admitting: Cardiovascular Disease

## 2020-03-22 ENCOUNTER — Other Ambulatory Visit: Payer: Self-pay

## 2020-03-22 ENCOUNTER — Ambulatory Visit
Admission: RE | Admit: 2020-03-22 | Discharge: 2020-03-22 | Disposition: A | Discharge status: Home / Self Care | Payer: Medicare Other | Source: Ambulatory Visit | Attending: Hematology | Admitting: Hematology

## 2020-03-22 DIAGNOSIS — R922 Inconclusive mammogram: Secondary | ICD-10-CM | POA: Diagnosis not present

## 2020-03-22 DIAGNOSIS — Z170 Estrogen receptor positive status [ER+]: Secondary | ICD-10-CM

## 2020-03-22 DIAGNOSIS — C50411 Malignant neoplasm of upper-outer quadrant of right female breast: Secondary | ICD-10-CM

## 2020-03-22 HISTORY — DX: Personal history of irradiation: Z92.3

## 2020-03-22 HISTORY — DX: Personal history of antineoplastic chemotherapy: Z92.21

## 2020-04-01 NOTE — Progress Notes (Signed)
[facility] Note  04/02/2020     CHIEF COMPLAINT Patient presents for Retina Follow Up   HISTORY OF PRESENT ILLNESS: Marissa Caldwell is a 86 y.o. female who presents to the clinic today for:   HPI    Retina Follow Up    Patient presents with  Wet AMD.  In right eye.  This started 3 months ago.  Severity is moderate.  I, the attending physician,  performed the HPI with the patient and updated documentation appropriately.          Comments    Patient here for 3 months retina follow up for exu ARMD OD. Patient states vision doing ok. No eye pain.        Last edited by Bernarda Caffey, MD on 04/06/2020  1:42 AM. (History)    pt states her vision seems to be about the same, pt is still taking AREDS 2   Referring physician: Hortencia Pilar, MD [place],  [place] [id number]  HISTORICAL INFORMATION:   Selected notes from the MEDICAL RECORD NUMBER Referred by Dr. Quentin Ore for concern of exudative ARMD OD   CURRENT MEDICATIONS: Current Outpatient Medications (Ophthalmic Drugs)  Medication Sig  . brimonidine (ALPHAGAN) 0.2 % ophthalmic solution Place 1 drop into the left eye 2 (two) times daily.   Marland Kitchen latanoprost (XALATAN) 0.005 % ophthalmic solution Place 1 drop into both eyes at bedtime.   Current Facility-Administered Medications (Ophthalmic Drugs)  Medication Route  . aflibercept (EYLEA) SOLN 2 mg Intravitreal  . aflibercept (EYLEA) SOLN 2 mg Intravitreal  . aflibercept (EYLEA) SOLN 2 mg Intravitreal  . aflibercept (EYLEA) SOLN 2 mg Intravitreal  . aflibercept (EYLEA) SOLN 2 mg Intravitreal   Current Outpatient Medications (Other)  Medication Sig  . albuterol (PROVENTIL HFA;VENTOLIN HFA) 108 (90 Base) MCG/ACT inhaler Inhale 2 puffs into the lungs every 6 (six) hours as needed (for shortness of breath/wheezing.).   Marland Kitchen aspirin EC 81 MG tablet Take 81 mg by mouth daily.  . budesonide-formoterol (SYMBICORT) 160-4.5  MCG/ACT inhaler Inhale 2 puffs into the lungs 2 (two) times daily.  . Calcium Carb-Cholecalciferol (CALCIUM + D3) 600-200 MG-UNIT TABS Take 1 tablet by mouth daily.  . Cholecalciferol (VITAMIN D3) 2000 units capsule Take 4,000 Units by mouth daily.  . Coenzyme Q10 (COQ10) 100 MG CAPS Take 100 mg by mouth daily.  Marland Kitchen levothyroxine (SYNTHROID, LEVOTHROID) 100 MCG tablet Take 100 mcg by mouth daily before breakfast.   . LORazepam (ATIVAN) 0.5 MG tablet Take 0.5 mg by mouth 2 (two) times daily.   . LUTEIN PO Take 1 tablet by mouth daily.  . mirtazapine (REMERON) 15 MG tablet Take 15 mg by mouth at bedtime.  . Multiple Vitamin (MULTIVITAMIN) tablet Take 1 tablet by mouth daily.  . nitroGLYCERIN (NITROSTAT) 0.4 MG SL tablet Place 1 tablet (0.4 mg total) under the tongue every 5 (five) minutes as needed for chest pain.  . Omega-3 Fatty Acids (FISH OIL PO) Take 1 capsule by mouth daily.  . pravastatin (PRAVACHOL) 40 MG tablet TAKE 1 TABLET BY MOUTH DAILY GENERIC EQUIVALENT FOR PRAVACHOL  . tamoxifen (NOLVADEX) 20 MG tablet Take 1 tablet (20 mg total) by mouth daily.  . TOVIAZ 4 MG TB24 tablet Take 4 mg by mouth daily.   . traMADol (ULTRAM) 50 MG tablet Take 1 tablet (50 mg total) by mouth every 6 (six) hours as needed.   Current Facility-Administered Medications (Other)  Medication Route  .  Bevacizumab (AVASTIN) SOLN 1.25 mg Intravitreal  . Bevacizumab (AVASTIN) SOLN 1.25 mg Intravitreal  . Bevacizumab (AVASTIN) SOLN 1.25 mg Intravitreal      REVIEW OF SYSTEMS: ROS    Positive for: Cardiovascular, Eyes, Respiratory   Negative for: Constitutional, Gastrointestinal, Neurological, Skin, Genitourinary, Musculoskeletal, HENT, Endocrine, Psychiatric, Allergic/Imm, Heme/Lymph   Last edited by Theodore Demark, COA on 04/02/2020  1:39 PM. (History)       ALLERGIES Allergies  Allergen Reactions  . Lipitor [Atorvastatin]     Elevated LFT's    PAST MEDICAL HISTORY Past Medical History:   Diagnosis Date  . Anxiety   . Arthritis   . Breast cancer ([place])   . CAD (coronary artery disease)    a. 03/2017: 95% LCx stenosis --> PCI/DES placed  . Cataract   . Chest pain 03/27/2017  . Chronic diastolic heart failure ([place]) 06/25/2017  . Chronic lower back pain   . Colon cancer ([place]) 11/2019   s/p colectomy  . COPD (chronic obstructive pulmonary disease) ([place])   . Coronary artery disease   . Diverticulitis   . Diverticulosis   . GERD (gastroesophageal reflux disease)   . Glaucoma   . Heart failure, type unknown ([place]) 03/11/2017  . History of radiation therapy 08/05/19- 09/03/19   Right Breast/ SCV 16 fractions of 2.66 Gy each for a total of 42.56 Gy. Right breast boost 4 fractions of 2 Gy each to total 8 Gy.   Marland Kitchen Hyperlipidemia 03/11/2017  . Hypertension   . Hypothyroidism   . Macular degeneration    wet and dry per pt's son  . Personal history of chemotherapy   . Personal history of radiation therapy   . Pneumonia X 1   "years and years ago" (03/26/2017)  . Shortness of breath 03/11/2017  . Status post dilation of esophageal narrowing    Past Surgical History:  Procedure Laterality Date  . BILATERAL OOPHORECTOMY Bilateral   . BREAST LUMPECTOMY Right 06/19/2019  . BREAST LUMPECTOMY WITH RADIOACTIVE SEED AND SENTINEL LYMPH NODE BIOPSY Right 06/19/2019   Procedure: RIGHT BREAST LUMPECTOMY WITH RADIOACTIVE SEED AND SENTINEL LYMPH NODE BIOPSY;  Surgeon: Jovita Kussmaul, MD;  Location: [facility];  Service: General;  Laterality: Right;  . CATARACT EXTRACTION    . CATARACT EXTRACTION W/ INTRAOCULAR LENS  IMPLANT, BILATERAL Bilateral   . COLOSTOMY     Greater than 10 yrs  . CORONARY ANGIOPLASTY WITH STENT PLACEMENT  03/26/2017  . LAPAROSCOPIC RIGHT COLECTOMY Right 12/15/2019   Procedure: LAPAROSCOPIC ASSISTED RIGHT COLECTOMY;  Surgeon: Jovita Kussmaul, MD;  Location: [facility];  Service: General;  Laterality: Right;  . LEFT HEART CATH AND CORONARY ANGIOGRAPHY N/A 03/26/2017   Procedure: Left  Heart Cath and Coronary Angiography;  Surgeon: Belva Crome, MD;  Location: [facility];  Service: Cardiovascular;  Laterality: N/A;  . NERVE SURGERY     "lump on my sciatic nerve was removed years ago"  . SHOULDER OPEN ROTATOR CUFF REPAIR Right   . THYROIDECTOMY, PARTIAL    . TONSILLECTOMY    . UPPER GASTROINTESTINAL ENDOSCOPY     > ten yrs    FAMILY HISTORY Family History  Problem Relation Age of Onset  . CAD Mother   . Cataracts Mother   . Coronary artery disease Father   . Leukemia Sister   . Amblyopia Neg Hx   . Blindness Neg Hx   . Diabetes Neg Hx   . Glaucoma Neg Hx   . Macular degeneration Neg  Hx   . Retinal detachment Neg Hx   . Strabismus Neg Hx   . Retinitis pigmentosa Neg Hx   . Colon cancer Neg Hx   . Colon polyps Neg Hx   . Esophageal cancer Neg Hx   . Rectal cancer Neg Hx   . Stomach cancer Neg Hx     SOCIAL HISTORY Social History   Tobacco Use  . Smoking status: Current Some Day Smoker    Packs/day: 0.50    Years: 60.00    Pack years: 30.00    Types: Cigarettes  . Smokeless tobacco: Never Used  Vaping Use  . Vaping Use: Never used  Substance Use Topics  . Alcohol use: Yes    Comment: twice a month  . Drug use: No         OPHTHALMIC EXAM:  Base Eye Exam    Visual Acuity (Snellen - Linear)      Right Left   Dist cc CF at 2' 20/70 -2   Dist ph cc NI    Correction: Glasses       Tonometry (Tonopen, 1:36 PM)      Right Left   Pressure 08 08       Pupils      Dark Light Shape React APD   Right 5 4 Round Brisk None   Left 4 3 Round Brisk None       Visual Fields (Counting fingers)      Left Right    Full Full       Extraocular Movement      Right Left    Full, Ortho Full, Ortho       Neuro/Psych    Oriented x3: Yes   Mood/Affect: Normal       Dilation    Both eyes: 1.0% Mydriacyl, 2.5% Phenylephrine @ 1:36 PM        Slit Lamp and Fundus Exam    Slit Lamp Exam      Right Left   Lids/Lashes  Dermatochalasis - upper lid, Telangiectasia Dermatochalasis - upper lid, Telangiectasia   Conjunctiva/Sclera White and quiet White and quiet   Cornea Arcus, 2+ diffuse Punctate epithelial erosions, diffuse endo pigment, decreased TBUT, Krukenberg's spindle Arcus, 2-3+ diffuse Punctate epithelial erosions, mild corneal haze centrally, diffuse endo pigment, decreased TBUT, Krukenberg's spindle   Anterior Chamber Deep and quiet Deep and quiet   Iris Round and dilated Round and dilated   Lens Three piece PC IOL in good position, open PC Three piece PC IOL in good position, clear PC   Vitreous Vitreous syneresis, Posterior vitreous detachment, mild vitreous condensations Mild Vitreous syneresis, Posterior vitreous detachment       Fundus Exam      Right Left   Disc sharp rim, 360 Peripapillary atrophy, Pallor 360 Peripapillary atrophy, trace temporal pallor, sharp rim   C/D Ratio 0.2 0.1   Macula Blunted foveal reflex, Drusen, RPE mottling and clumping, +PED, +CNVM, sub-retinal Disciform scar, punctate IRH nasal macula Blunted foveal reflex, Drusen, RPE mottling, clumping, and Atrophy, focal GA nasal macula--slightly increased   Vessels Vascular attenuation Vascular attenuation   Periphery Attached Attached        Refraction    Wearing Rx      Sphere Cylinder Axis   Right -1.00 +1.50 014   Left -1.25 +1.25 174          IMAGING AND PROCEDURES  Imaging and Procedures for @TODAY @  OCT, Retina - OU - Both Eyes  Right Eye Quality was good. Central Foveal Thickness: 413. Progression has been stable. Findings include subretinal hyper-reflective material, choroidal neovascular membrane, retinal drusen , epiretinal membrane, disciform scar, abnormal foveal contour, pigment epithelial detachment (persistent thick PED/SRHM/disciform scar).   Left Eye Quality was good. Central Foveal Thickness: 194. Progression has been stable. Findings include abnormal foveal contour, retinal drusen , no  IRF, no SRF, outer retinal atrophy, pigment epithelial detachment (Mild interval progression of GA area in en face image).   Notes *Images captured and stored on drive  Diagnosis / Impression:  OD: Exudative AMD with significant submacular scar/SRHM --  persistent thick PED/SRHM/disciform scar OS: Non-exudative AMD w/ GA - stable from prior  Clinical management:  See below  Abbreviations: NFP - Normal foveal profile. CME - cystoid macular edema. PED - pigment epithelial detachment. IRF - intraretinal fluid. SRF - subretinal fluid. EZ - ellipsoid zone. ERM - epiretinal membrane. ORA - outer retinal atrophy. ORT - outer retinal tubulation. SRHM - subretinal hyper-reflective material         Intravitreal Injection, Pharmacologic Agent - OD - Right Eye       Time Out 04/02/2020. 1:43 PM. Confirmed correct patient, procedure, site, and patient consented.   Anesthesia Topical anesthesia was used. Anesthetic medications included Lidocaine 2%, Proparacaine 0.5%.   Procedure Preparation included 5% betadine to ocular surface, eyelid speculum. A (32g) needle was used.   Injection:  2 mg aflibercept Alfonse Flavors) SOLN   NDC: A3590391, Lot: [id number], Expiration date: 05/21/2020   Route: Intravitreal, Site: Right Eye, Waste: 0.05 mL  Post-op Post injection exam found visual acuity of at least counting fingers. The patient tolerated the procedure well. There were no complications. The patient received written and verbal post procedure care education.                 ASSESSMENT/PLAN:    ICD-10-CM   1. 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 Retinal edema  H35.81 OCT, Retina - OU - Both Eyes  4. Posterior vitreous  detachment of both eyes  H43.813   5. Pigmentary glaucoma of both eyes, mild stage  H40.1331   6. Pseudophakia of both eyes  Z96.1     1. 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 Kathlen Mody  - S/P IVA OD #1 (05.20.19), #2 (07.02.19), #3 (07.30.19)  - S/P IVE OD #1 (08.27.19), #2 (09.24.19), #3 (11.05.19), #4 (12.17.19), #5 (02.21.20), #6 (05.05.20), # 7 (08.05.20), #8 (9.2.20), #9 (11.10.20), #10 (02.12.21), #11 (05.12.21)  - VA remains CF  - OCT with stable improvment in peripapillary / nasal macula IRF and SRF; persistent thick PED/SRHM/disciform scar  - discussed findings and guarded prognosis and treatment options -- pt wishes to continue maintenance therapy for preservation of peripheral vision  - switch in therapy to [facility] approved through [facility]  - recommend IVE OD #12 today (08.13.21) w/ f/u in 14 weeks for maintenance  - pt wishes to be treated with IVE OD  - RBA of procedure discussed, questions answered  - informed consent obtained  - Eylea informed consent form signed and scanned on 11.10.2020  - see procedure note  - Eylea paperwork and benefits investigation started on 07.30.19 --  Approved for 2021 [employer]  - f/u in 14 weeks -- DFE/OCT/ possible injection  2. Age related macular degeneration, non-exudative, left eye  - mild progression of geographic atrophy on exam and OCT today  - The incidence, anatomy, and pathology of dry AMD, risk of progression, and the AREDS and AREDS 2 study including smoking risks discussed with patient.  - continue amsler grid monitoring  3. Retinal edema as above  4. PVD / vitreous syneresis OU  Discussed findings and prognosis  No RT or RD on 360 peripheral exam  Reviewed s/s of RT/RD  Strict return precautions for any such RT/RD signs/symptoms  5. Pigmentary glaucoma OU-   - using latanoprost OU qhs  - IOP good today  - under the expert care of Dr. Quentin Ore  - monitor  6. Pseudophakia OU  - s/p CE/IOL OU  - beautiful surgery, doing well  - monitor   Ophthalmic Meds Ordered this visit:  Meds ordered this encounter  Medications  . aflibercept (EYLEA) SOLN 2 mg       Return in about 14 weeks (around 07/09/2020) for f/u exu ARMD OD, OCT, FA.  There are no Patient Instructions on file for this visit.   Explained the diagnoses, plan, and follow up with the patient and they expressed understanding.  Patient expressed understanding of the importance of proper follow up care.   This document serves as a record of services personally performed by Gardiner Sleeper, MD, PhD. It was created on their behalf by San Jetty. Owens Shark, OA an ophthalmic technician. The creation of this record is the provider's dictation and/or activities during the visit.    Electronically signed by: San Jetty. Owens Shark, [place] 08.12.2021 1:45 AM  Gardiner Sleeper, M.D., Ph.D. Diseases & Surgery of the Retina and Vitreous [facility]  I have reviewed the above documentation for accuracy and completeness, and I agree with the above. Gardiner Sleeper, M.D., Ph.D. 04/06/20 1:45 AM    Abbreviations: M myopia (nearsighted); A astigmatism; H hyperopia (farsighted); P presbyopia; Mrx spectacle prescription;  CTL contact lenses; OD right eye; OS left eye; OU both eyes  XT exotropia; ET esotropia; PEK punctate epithelial keratitis; PEE punctate epithelial erosions; DES dry eye syndrome; MGD meibomian gland dysfunction; ATs artificial tears; PFAT's preservative free artificial tears; [place] nuclear sclerotic cataract; PSC posterior subcapsular cataract; ERM epi-retinal membrane; PVD posterior vitreous detachment; RD retinal detachment; DM diabetes mellitus; DR diabetic retinopathy; NPDR non-proliferative diabetic retinopathy; PDR proliferative diabetic retinopathy; CSME clinically significant macular edema; DME diabetic macular edema; dbh dot blot hemorrhages; CWS  cotton wool spot; POAG primary open angle glaucoma; C/D cup-to-disc ratio; HVF humphrey visual field; GVF goldmann visual field; OCT optical coherence tomography; IOP intraocular pressure; BRVO Branch retinal vein occlusion; CRVO central retinal vein occlusion; CRAO central retinal artery occlusion; BRAO branch retinal artery occlusion; RT retinal tear; SB scleral buckle; PPV pars plana vitrectomy; VH Vitreous hemorrhage; PRP panretinal laser photocoagulation; IVK intravitreal kenalog; VMT vitreomacular traction; MH Macular hole;  NVD neovascularization of the disc; NVE neovascularization elsewhere; AREDS age related eye disease study; ARMD age related macular degeneration; POAG primary open angle glaucoma; EBMD epithelial/anterior basement membrane dystrophy; ACIOL anterior chamber intraocular lens; IOL intraocular lens; PCIOL posterior chamber intraocular lens; Phaco/IOL phacoemulsification with intraocular lens placement; [place] photorefractive keratectomy; LASIK laser assisted in situ keratomileusis; HTN hypertension; DM diabetes mellitus; COPD chronic obstructive pulmonary disease

## 2020-04-02 ENCOUNTER — Encounter (INDEPENDENT_AMBULATORY_CARE_PROVIDER_SITE_OTHER): Payer: Self-pay | Admitting: Ophthalmology

## 2020-04-02 ENCOUNTER — Ambulatory Visit (INDEPENDENT_AMBULATORY_CARE_PROVIDER_SITE_OTHER): Payer: Medicare Other | Admitting: Ophthalmology

## 2020-04-02 ENCOUNTER — Other Ambulatory Visit: Payer: Self-pay

## 2020-04-02 DIAGNOSIS — H3581 Retinal edema: Secondary | ICD-10-CM

## 2020-04-02 DIAGNOSIS — H353122 Nonexudative age-related macular degeneration, left eye, intermediate dry stage: Secondary | ICD-10-CM

## 2020-04-02 DIAGNOSIS — H353211 Exudative age-related macular degeneration, right eye, with active choroidal neovascularization: Secondary | ICD-10-CM

## 2020-04-02 DIAGNOSIS — Z961 Presence of intraocular lens: Secondary | ICD-10-CM

## 2020-04-02 DIAGNOSIS — H43813 Vitreous degeneration, bilateral: Secondary | ICD-10-CM | POA: Diagnosis not present

## 2020-04-02 DIAGNOSIS — H401331 Pigmentary glaucoma, bilateral, mild stage: Secondary | ICD-10-CM

## 2020-04-06 ENCOUNTER — Encounter (INDEPENDENT_AMBULATORY_CARE_PROVIDER_SITE_OTHER): Payer: Self-pay | Admitting: Ophthalmology

## 2020-04-06 MED ORDER — AFLIBERCEPT 2MG/0.05ML IZ SOLN FOR KALEIDOSCOPE
2.0000 mg | INTRAVITREAL | Status: AC | PRN
Start: 2020-04-06 — End: 2020-04-06
  Administered 2020-04-06: 2 mg via INTRAVITREAL

## 2020-04-08 NOTE — Progress Notes (Signed)
[place]   [phone] [phone]   Clinic Follow up Note   Patient Care Team: [name], MD as PCP - General Skeet Latch, MD as PCP - Cardiology (Cardiology) Rockwell Germany, RN as Oncology Nurse Navigator Mauro Kaufmann, RN as Oncology Nurse Navigator Jovita Kussmaul, MD as Consulting Physician (General Surgery) [name], MD as Consulting Physician (Hematology) Eppie Gibson, MD as Attending Physician (Radiation Oncology)  Date of Service:  04/09/2020  CHIEF COMPLAINT: F/u of right breast cancer and colon cancer   SUMMARY OF ONCOLOGIC HISTORY: Oncology History Overview Note  Cancer Staging Cancer of right colon [facility]) Staging form: Colon and Rectum, AJCC 8th Edition - Pathologic stage from 12/15/2019: Stage I (pT2, pN0, cM0) - Signed by [name], MD on 01/08/2020  Malignant neoplasm of upper-outer quadrant of right breast in female, estrogen receptor positive ([place]) Staging form: Breast, AJCC 8th Edition - Clinical: Stage IIA (cT1c, cN1, cM0, G3, ER+, PR+, HER2-) - Signed by [name], MD on 04/09/2019 - Pathologic stage from 06/19/2019: Stage IB (pT1c, pN2a, cM0, G2, ER+, PR+, HER2-) - Signed by [name], MD on 07/03/2019    Malignant neoplasm of upper-outer quadrant of right breast in female, estrogen receptor positive ([place])  03/19/2019 Mammogram   Diagnostic Mammogram 03/19/19  IMPRESSION The 2 cm distortion in the  right breast 9:30 position middle depth 3cm from the nipple is indeterminate. [place] recommended.  There is also a 1.2cmx3.2cm enlarged right axillary LN highly suggestive of malignancy.     03/31/2019 Initial Biopsy   Diagnosis 03/31/19  1. Breast, right, needle core biopsy, 9:30 o'clock, mid depth, 3cmfn - INVASIVE DUCTAL CARCINOMA. - LYMPHOVASCULAR INVASION IS IDENTIFIED. - SEE COMMENT. 2. Lymph node, needle/core biopsy, right axilla - INVASIVE DUCTAL CARCINOMA. - SEE COMMENT.   03/31/2019 Receptors her2   1.  PROGNOSTIC INDICATORS Results: IMMUNOHISTOCHEMICAL AND MORPHOMETRIC ANALYSIS PERFORMED MANUALLY The tumor cells are NEGATIVE for Her2 (0). Estrogen Receptor: 100%, POSITIVE, STRONG STAINING INTENSITY Progesterone Receptor: 10%, POSITIVE, STRONG STAINING INTENSITY Proliferation Marker Ki67: 10%  2. PROGNOSTIC INDICATORS Results: IMMUNOHISTOCHEMICAL AND MORPHOMETRIC ANALYSIS PERFORMED MANUALLY Estrogen Receptor: 100%, POSITIVE, STRONG STAINING INTENSITY Progesterone Receptor: 0%, NEGATIVE Proliferation Marker Ki67: 40%   04/03/2019 Initial Diagnosis   Malignant neoplasm of upper-outer quadrant of right breast in female, estrogen receptor positive ([place])   04/09/2019 Cancer Staging   Staging form: Breast, AJCC 8th Edition - Clinical: Stage IIA (cT1c, cN1, cM0, G3, ER+, PR+, HER2-) - Signed by [name], MD on 04/09/2019  Staging form: Breast, AJCC 8th Edition - Clinical: Stage IIA (cT1c, cN1, cM0, G3, ER+, PR+, HER2-) - Signed by [name], MD on 04/09/2019   03/2019 -  Anti-estrogen oral therapy   Tamoxifen 30m daily starting 03/2019. Held 05/30/19 -07/2019 for surgery.   06/19/2019 Surgery   RIGHT BREAST LUMPECTOMY WITH RADIOACTIVE SEED AND SENTINEL LYMPH NODE BIOPSY by Dr [name] 06/19/19    06/19/2019 Pathology Results   FINAL MICROSCOPIC DIAGNOSIS: 06/19/19  A. BREAST, RIGHT, LUMPECTOMY:  -  Mucinous carcinoma, Nottingham grade 2  -  Margins uninvolved by carcinoma (0.2 cm; medial margin)  -  Lymphovascular space invasion present  -  Previous biopsy site changes present  -  See oncology table and comment below   B. LYMPH NODE, RIGHT AXILLARY #1, SENTINEL, BIOPSY:  -  Metastatic carcinoma involving two lymph nodes (2/2)  -  Extracapsular extension present  -  See comment   C. BREAST, RIGHT MEDIAL MARGIN, EXCISION:  -  Residual invasive carcinoma  -  Usual ductal hyperplasia with calcifications  -  See comment   D. LYMPH NODE, RIGHT AXILLARY #2, SENTINEL, BIOPSY:  -   Metastatic carcinoma involving one lymph node (1/1)   E. LYMPH NODE, RIGHT AXILLARY #1 , BIOPSY:  -  Metastatic carcinoma involving one lymph node (1/1)   F. LYMPH NODE, RIGHT AXILLARY #2, BIOPSY:  -  No carcinoma identified in one lymph node (0/1)    06/19/2019 Cancer Staging   Staging form: Breast, AJCC 8th Edition - Pathologic stage from 06/19/2019: Stage IB (pT1c, pN2a, cM0, G2, ER+, PR+, HER2-) - Signed by [name], MD on 07/03/2019   07/22/2019 PET scan   IMPRESSION: 1. Low level hypermetabolism identified in the surgical beds of the right breast and right axilla. This is presumably related to the surgery. 2. Tiny right subpectoral lymph nodes are asymmetric in concerning for metastatic disease on CT imaging. No hypermetabolism on the PET study although size of these lymph nodes is below reliable resolution on PET imaging. 3. 10 mm short axis precarinal lymph node shows low level hypermetabolism. Metastatic involvement not excluded. 4. 11 mm ground-glass nodule in right upper lobe shows low level FDG accumulation. Well differentiated or low-grade neoplasm is a distinct concern. Close follow-up recommended. 5. Focal hypermetabolism in the right colon, potentially related to the ileocecal valve but incompletely characterized. Adenoma or carcinoma could have this appearance. Correlation with colorectal cancer screening history recommended. 6. Focal hypermetabolism identified in the region of the left pubic bone and adjacent sigmoid colon. No underlying bony or colonic lesion evident. Metastatic involvement of the left pubic bone not excluded. 7.  Aortic Atherosclerois (ICD10-170.0)     08/05/2019 - 09/03/2019 Radiation Therapy   Adjuvant RT with Dr Isidore Moos 08/05/19-09/03/19.    Cancer of right colon ([place])  10/15/2019 Procedure   Colonoscopy by Dr Silverio Decamp  IMPRESSION - Likely malignant tumor in the proximal ascending colon. Biopsied. Tattooed. - Severe diverticulosis in  the sigmoid colon, in the descending colon, in the transverse colon and in the ascending colon. - Non-bleeding internal hemorrhoids.   10/15/2019 Initial Biopsy   Diagnosis Ascending Colon Polyp, mass - ADENOCARCINOMA. Microscopic Comment IHC for MMR will be reported separately. Results reported to Dr. Harl Bowie on 10/16/2019. Intradepartmental consultation (Dr. Vic Ripper).   12/15/2019 Initial Diagnosis   Cancer of right colon ([place])   12/15/2019 Cancer Staging   Staging form: Colon and Rectum, AJCC 8th Edition - Pathologic stage from 12/15/2019: Stage I (pT2, pN0, cM0) - Signed by [name], MD on 01/08/2020   12/15/2019 Surgery   LAPAROSCOPIC ASSISTED RIGHT COLECTOMY by Dr Marlou Starks and Dr Marcello Moores   12/15/2019 Pathology Results   FINAL MICROSCOPIC DIAGNOSIS:   A. COLON, TERMINAL ILEUM AND RIGHT, COLECTOMY:  - Invasive moderately differentiated adenocarcinoma, 4.6 cm, involving  ascending colon  - Carcinoma invades into deeper muscularis propria but not beyond  - Resection margins are negative for carcinoma  - Negative for lymphovascular or perineural invasion  - Unremarkable appendix  - Nineteen lymph nodes, negative for carcinoma (0/19)  - See oncology table    12/15/2019 Genetic Testing   ADDENDUM:   Mismatch Repair Protein (IHC)  SUMMARY INTERPRETATION: ABNORMAL  There is loss of the major and minor MMR proteins MLH1 and PMS2. The  loss of expression may be secondary to promoter hyper-methylation, gene  mutation or other genetic event. BRAF mutation testing and/or MLH1  methylation testing is indicated. The presence of a BRAF mutation and/or  MLH1 hypermethylation is indicative of a sporadic-type tumor. The  absence of either BRAF mutation and/or presence of normal methylation  indicate the possible presence of a hereditary germline mutation (e.g.  Lynch syndrome) and referral to genetic counseling is warranted. It is  recommended that the loss of protein expression be  correlated with  molecular based MSI testing.   IHC EXPRESSION RESULTS  TEST           RESULT  MLH1:          LOSS OF NUCLEAR EXPRESSION  MSH2:          Preserved nuclear expression  MSH6:          Preserved nuclear expression  PMS2:          LOSS OF NUCLEAR EXPRESSION    12/29/2019 Imaging   CT AP W contrast  IMPRESSION: 1. Sigmoid diverticulosis without inflammation. Status post right colectomy. 2. Multiple small calcified uterine fibroids. 3. No acute abnormality seen in the abdomen or pelvis.   Aortic Atherosclerosis (ICD10-I70.0).     01/28/2020 Imaging   CT AP w contrast  IMPRESSION: 1. Moderate irregular wall thickening of the right colon with some areas of low attenuation. Findings could be due to cecal diverticulitis or other inflammatory or infectious colitis. I doubt that this represents recurrent colon cancer. 2. Status post partial right colectomy. No findings for locoregional adenopathy or metastatic disease. 3. Stable advanced atherosclerotic calcifications involving the aorta and branch vessels. 4. Stable benign-appearing central hepatic cysts. 5. Stable calcified uterine fibroids. 6. Aortic atherosclerosis.   Aortic Atherosclerosis (ICD10-I70.0).      CURRENT THERAPY:  Tamoxifen 11m once daily since 03/2019. Held 05/2019-07/2019 for surgery.   INTERVAL HISTORY:  Marissa Caldwell is here for a follow up of right breast cancer and colon cancer. She presents to the clinic with her son. She notes she is doing well. She had C. Diff and has resolved with antibiotics. She denies any pain now. She has dull pain in her right arm. She notes asper cream did not help. She has mild limited ROM of right arm. She notes this is tolerable. She notes she is taking Tamoxifen and tolerating well. She notes mild numbness of her fingers. She notes after bumping her left lower leg and lead to wound. This has large scab. She denies recent fall. She notes she is fatigued but able  to still take care of herself. She notes she has been eating very well and take ensure but still loses weight.     REVIEW OF SYSTEMS:   Constitutional: Denies fevers, chills or abnormal weight loss (+) Fatigue  Eyes: Denies blurriness of vision Ears, nose, mouth, throat, and face: Denies mucositis or sore throat Respiratory: Denies cough, dyspnea or wheezes Cardiovascular: Denies palpitation, chest discomfort or lower extremity swelling Gastrointestinal:  Denies nausea, heartburn or change in bowel habits Skin: Denies abnormal skin rashes (+) Healing left lower leg wound with scab MSK:(+) Right arm pain and limited ROM Lymphatics: Denies new lymphadenopathy (+) easy bruising Neurological:Denies numbness, tingling or new weaknesses Behavioral/Psych: Mood is stable, no new changes  All other systems were reviewed with the patient and are negative.  MEDICAL HISTORY:  Past Medical History:  Diagnosis Date  . Anxiety   . Arthritis   . Breast cancer ([place]   . CAD (coronary artery disease)    a. 03/2017: 95% LCx stenosis --> PCI/DES placed  . Cataract   . Chest pain 03/27/2017  . Chronic diastolic  heart failure ([place]) 06/25/2017  . Chronic lower back pain   . Colon cancer ([place]) 11/2019   s/p colectomy  . COPD (chronic obstructive pulmonary disease) ([place])   . Coronary artery disease   . Diverticulitis   . Diverticulosis   . GERD (gastroesophageal reflux disease)   . Glaucoma   . Heart failure, type unknown ([place]) 03/11/2017  . History of radiation therapy 08/05/19- 09/03/19   Right Breast/ SCV 16 fractions of 2.66 Gy each for a total of 42.56 Gy. Right breast boost 4 fractions of 2 Gy each to total 8 Gy.   Marland Kitchen Hyperlipidemia 03/11/2017  . Hypertension   . Hypothyroidism   . Macular degeneration    wet and dry per pt's son  . Personal history of chemotherapy   . Personal history of radiation therapy   . Pneumonia X 1   "years and years ago" (03/26/2017)  . Shortness of breath 03/11/2017    . Status post dilation of esophageal narrowing     SURGICAL HISTORY: Past Surgical History:  Procedure Laterality Date  . BILATERAL OOPHORECTOMY Bilateral   . BREAST LUMPECTOMY Right 06/19/2019  . BREAST LUMPECTOMY WITH RADIOACTIVE SEED AND SENTINEL LYMPH NODE BIOPSY Right 06/19/2019   Procedure: RIGHT BREAST LUMPECTOMY WITH RADIOACTIVE SEED AND SENTINEL LYMPH NODE BIOPSY;  Surgeon: Jovita Kussmaul, MD;  Location: [place];  Service: General;  Laterality: Right;  . CATARACT EXTRACTION    . CATARACT EXTRACTION W/ INTRAOCULAR LENS  IMPLANT, BILATERAL Bilateral   . COLOSTOMY     Greater than 10 yrs  . CORONARY ANGIOPLASTY WITH STENT PLACEMENT  03/26/2017  . LAPAROSCOPIC RIGHT COLECTOMY Right 12/15/2019   Procedure: LAPAROSCOPIC ASSISTED RIGHT COLECTOMY;  Surgeon: Jovita Kussmaul, MD;  Location: [place];  Service: General;  Laterality: Right;  . LEFT HEART CATH AND CORONARY ANGIOGRAPHY N/A 03/26/2017   Procedure: Left Heart Cath and Coronary Angiography;  Surgeon: Belva Crome, MD;  Location: [place] [facility];  Service: Cardiovascular;  Laterality: N/A;  . NERVE SURGERY     "lump on my sciatic nerve was removed years ago"  . SHOULDER OPEN ROTATOR CUFF REPAIR Right   . THYROIDECTOMY, PARTIAL    . TONSILLECTOMY    . UPPER GASTROINTESTINAL ENDOSCOPY     > ten yrs    I have reviewed the social history and family history with the patient and they are unchanged from previous note.  ALLERGIES:  is allergic to lipitor [atorvastatin].  MEDICATIONS:  Current Outpatient Medications  Medication Sig Dispense Refill  . albuterol (PROVENTIL HFA;VENTOLIN HFA) 108 (90 Base) MCG/ACT inhaler Inhale 2 puffs into the lungs every 6 (six) hours as needed (for shortness of breath/wheezing.).     Marland Kitchen aspirin EC 81 MG tablet Take 81 mg by mouth daily.    . brimonidine (ALPHAGAN) 0.2 % ophthalmic solution Place 1 drop into the left eye 2 (two) times daily.     . budesonide-formoterol (SYMBICORT) 160-4.5 MCG/ACT  inhaler Inhale 2 puffs into the lungs 2 (two) times daily.    . Calcium Carb-Cholecalciferol (CALCIUM + D3) 600-200 MG-UNIT TABS Take 1 tablet by mouth daily.    . Cholecalciferol (VITAMIN D3) 2000 units capsule Take 4,000 Units by mouth daily.    . Coenzyme Q10 (COQ10) 100 MG CAPS Take 100 mg by mouth daily.    Marland Kitchen latanoprost (XALATAN) 0.005 % ophthalmic solution Place 1 drop into both eyes at bedtime.    Marland Kitchen levothyroxine (SYNTHROID, LEVOTHROID) 100 MCG tablet Take 100 mcg  by mouth daily before breakfast.   0  . LORazepam (ATIVAN) 0.5 MG tablet Take 0.5 mg by mouth 2 (two) times daily.     . LUTEIN PO Take 1 tablet by mouth daily.    . mirtazapine (REMERON) 15 MG tablet Take 15 mg by mouth at bedtime.    . Multiple Vitamin (MULTIVITAMIN) tablet Take 1 tablet by mouth daily.    . nitroGLYCERIN (NITROSTAT) 0.4 MG SL tablet Place 1 tablet (0.4 mg total) under the tongue every 5 (five) minutes as needed for chest pain. 25 tablet 3  . Omega-3 Fatty Acids (FISH OIL PO) Take 1 capsule by mouth daily.    . pravastatin (PRAVACHOL) 40 MG tablet TAKE 1 TABLET BY MOUTH DAILY GENERIC EQUIVALENT FOR PRAVACHOL 90 tablet 1  . tamoxifen (NOLVADEX) 20 MG tablet Take 1 tablet (20 mg total) by mouth daily. 90 tablet 3  . TOVIAZ 4 MG TB24 tablet Take 4 mg by mouth daily.   1  . traMADol (ULTRAM) 50 MG tablet Take 1 tablet (50 mg total) by mouth every 6 (six) hours as needed. 30 tablet 0   Current Facility-Administered Medications  Medication Dose Route Frequency Provider Last Rate Last Admin  . aflibercept (EYLEA) SOLN 2 mg  2 mg Intravitreal  Bernarda Caffey, MD   2 mg at 04/16/18 1424  . aflibercept (EYLEA) SOLN 2 mg  2 mg Intravitreal  Bernarda Caffey, MD   2 mg at 05/14/18 1358  . aflibercept (EYLEA) SOLN 2 mg  2 mg Intravitreal  Bernarda Caffey, MD   2 mg at 06/25/18 1433  . aflibercept (EYLEA) SOLN 2 mg  2 mg Intravitreal  Bernarda Caffey, MD   2 mg at 08/07/18 1557  . aflibercept (EYLEA) SOLN 2 mg  2 mg Intravitreal   Bernarda Caffey, MD   2 mg at 10/11/18 1522  . Bevacizumab (AVASTIN) SOLN 1.25 mg  1.25 mg Intravitreal  Bernarda Caffey, MD   1.25 mg at 01/07/18 1429  . Bevacizumab (AVASTIN) SOLN 1.25 mg  1.25 mg Intravitreal  Bernarda Caffey, MD   1.25 mg at 02/19/18 1356  . Bevacizumab (AVASTIN) SOLN 1.25 mg  1.25 mg Intravitreal  Bernarda Caffey, MD   1.25 mg at 03/19/18 1347    PHYSICAL EXAMINATION: ECOG PERFORMANCE STATUS: 2 - Symptomatic, <50% confined to bed  Vitals:   04/09/20 1301  BP: 140/76  Pulse: 86  Resp: 18  Temp: 97.8 F (36.6 C)  SpO2: 99%   Filed Weights   04/09/20 1301  Weight: 138 lb 9.6 oz (62.9 kg)    GENERAL:alert, no distress and comfortable SKIN: skin color, texture, turgor are normal, no rashes or significant lesions EYES: normal, Conjunctiva are pink and non-injected, sclera clear  NECK: supple, thyroid normal size, non-tender, without nodularity LYMPH:  no palpable lymphadenopathy in the cervical, axillary  LUNGS: clear to auscultation and percussion with normal breathing effort HEART: regular rate & rhythm and no murmurs and no lower extremity edema ABDOMEN:abdomen soft, non-tender and normal bowel sounds Musculoskeletal:no cyanosis of digits and no clubbing  NEURO: alert & oriented x 3 with fluent speech, no focal motor/sensory deficits BREAST: S/p right lumpectomy: surgical incision healed well. Left Breast exam benign.   LABORATORY DATA:  I have reviewed the data as listed CBC Latest Ref Rng & Units 04/09/2020 01/08/2020 12/16/2019  WBC 4.0 - 10.5 K/uL 5.1 5.8 7.3  Hemoglobin 12.0 - 15.0 g/dL 12.0 12.4 10.8(L)  Hematocrit 36 - 46 % 36.6 37.8 32.7(L)  Platelets 150 - 400 K/uL 213 354 228     CMP Latest Ref Rng & Units 04/09/2020 01/08/2020 12/21/2019  Glucose 70 - 99 mg/dL 98 99 156(H)  BUN 8 - 23 mg/dL 25(H) 20 9  Creatinine 0.44 - 1.00 mg/dL 0.78 0.74 0.64  Sodium 135 - 145 mmol/L 140 138 133(L)  Potassium 3.5 - 5.1 mmol/L 4.5 3.4(L) 2.9(L)  Chloride 98 - 111  mmol/L 106 99 99  CO2 22 - 32 mmol/L 27 29 25   Calcium 8.9 - 10.3 mg/dL 9.7 9.6 8.2(L)  Total Protein 6.5 - 8.1 g/dL 6.9 7.4 -  Total Bilirubin 0.3 - 1.2 mg/dL 0.3 0.3 -  Alkaline Phos 38 - 126 U/L 51 58 -  AST 15 - 41 U/L 21 16 -  ALT 0 - 44 U/L 12 11 -      RADIOGRAPHIC STUDIES: I have personally reviewed the radiological images as listed and agreed with the findings in the report. No results found.   ASSESSMENT & PLAN:  Marissa Caldwell is a 86 y.o. female with   1. Cancer of right colon, pT2N0M0 -She was diagnosed in 09/2019 by colonoscopy with Dr Silverio Decamp.  -She underwent right colectomy on 12/15/19 with Dr. Marlou Starks. 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 Negative margins and LNs.  -I again personally reviewed and discussed her PET from 07/2019 with patient and her son. Scan shows 11 mm ground-glass nodule in the right upper lobe. She also has Tiny subpectoral lymph nodes on the right which with no hypermetabolism on PET imaging likely due to their tiny size. I discussed these changes could be from her breast surgery, but overall should be monitored. I recommend a repeated CT chest, she is agreeable.  -Her CT AP from 01/28/20 showed no signs of findings of local or metastatic colon cancer and benign hepatic cysts.  -She is clinically doing well. She has not pain but notes fatigue and weight loss although she is eating adequately.  -I discussed her colon cancer was early stage and has a small risk of recurrence than her breast cancer. Due to her advanced age, I do not recommend routine surveillance CT scans I recommend continuing clinical surveillance with lab and exam. She is agreeable.  -F/u in 4 months    2.Malignant neoplasm of upper-outer quadrant of right breast,invasive ductal carcinoma,pT1cN2aMx, ER/PR+, HER2-,Grade II -She wasdiagnosedin 03/2019. She has invasiveductalcarcinoma which has spreadto  herlocalLN.  -She underwent right breast lumpectomy withtargeted lymph node dissectionbyDr Marlou Starks on 06/19/19. To reduce her risk of local cancer recurrence she underwent adjuvant RT with Dr. Isidore Moos 08/05/19-09/03/19. -She started Tamoxifen in 03/2019 and held 05/2019-07/2019 for surgery.  -From a breast cancer standpoint she is doing well. Labs reviewed, CBC and CMP WNL except BUN 25, albumin 3.1.  -Her PET from 07/2019 shows uptake of right chest wall likely related to surgery and Right subpectoral LN concerning which is indeterminate. However her Mammogram from 03/22/20 was benign. Will monitor.  -She has had right arm pain with mild limited ROM lately. I discussed this is likely from her breast surgery. So far tolerable.  -Continue Tamoxifen and surveillance with yearly mammograms.    3. CAD, HTN, Thyroid dysfunction -She has had a cardiac stent placed in the past. She will continue to follow up with her cardiologist. -She is on Coreg, nitro, pravastatin and other meds for her heartand thyroid medication  4. COPD, Long Term smoking, Ground-glass inflammation -On inhalers, not on home  oxygen -She still smokes occasionally. She has not completely quit.  -PET from 12/1/120 shows 50m ground-glass nodules in RU lung. Will monitor with CT chest in 1-2 weeks   5. Underweight, Fatigue  -After drastic change in diet she had lowered appetite and lost significant weight over a year. She has started to gain some of that weight back.  -She notes she does have adequate appetite and eating along with taking Ensure supplements. However her weight has been trending down slowly. She notes she is more fatigued lately.  -She is no longer on Mirtazapine.  -Will monitor. I encouraged her to increase calorie and protein intake.   6. Osteoporosis, Arthritis  -She receives [id number] Dr. WhDema Severinas recently been denied by insurance. If not approved I discussed option of Orla Fosamax,  she can discuss this with Dr. WhDema Severin-She will continue Calcium and Vit D.  -She had fracture of her wristsin 2019 -Her joint pain is mainly in her left hand and lower back when she stands for a long time.She does note b/l numbness in her fingers. This is manageable.   7. Macular Degeneration of both eyes, hearingloss -Managed by her Ophthalmologist  8.Anxiety/depression  -OnZoloft, mood stable -We discussed thatZoloft has very mild interaction with Tamoxifen, will monitor for now  9. Hypokalemia  -Her K was at 2.9 on 12/21/19 after her colon surgery.  -Will repeat lab today and if still low will start her on oral potassium.   PLAN: -CT Chest in 2 weeks. I will call her with results.  -Continue Tamoxifen  -Lab and F/u in 4 months    No problem-specific Assessment & Plan notes found for this encounter.   Orders Placed This Encounter  Procedures  . CT Chest W Contrast    Standing Status:   Future    Standing Expiration Date:   04/09/2021    Order Specific Question:   If indicated for the ordered procedure, I authorize the administration of contrast media per Radiology protocol    Answer:   Yes    Order Specific Question:   [facility]    [facility]  Order Specific Question:   Release to patient    Answer:   Immediate    Order Specific Question:   Radiology Contrast Protocol - do NOT remove file path    Answer:   [email or web address]   All questions were answered. The patient knows to call the clinic with any problems, questions or concerns. No barriers to learning was detected. The total time spent in the appointment was 30 minutes.     YaTruitt MerleMD 04/09/2020   I, AmJoslyn Devonam acting as scribe for YaTruitt MerleMD.   I have reviewed the above documentation for accuracy and completeness, and I agree with the above.

## 2020-04-09 ENCOUNTER — Inpatient Hospital Stay: Payer: Medicare Other | Attending: Hematology | Admitting: Hematology

## 2020-04-09 ENCOUNTER — Inpatient Hospital Stay: Payer: Medicare Other

## 2020-04-09 ENCOUNTER — Other Ambulatory Visit: Payer: Self-pay

## 2020-04-09 VITALS — BP 140/76 | HR 86 | Temp 97.8°F | Resp 18 | Ht 70.0 in | Wt 138.6 lb

## 2020-04-09 DIAGNOSIS — J449 Chronic obstructive pulmonary disease, unspecified: Secondary | ICD-10-CM | POA: Insufficient documentation

## 2020-04-09 DIAGNOSIS — I700 Atherosclerosis of aorta: Secondary | ICD-10-CM | POA: Diagnosis not present

## 2020-04-09 DIAGNOSIS — H3530 Unspecified macular degeneration: Secondary | ICD-10-CM | POA: Diagnosis not present

## 2020-04-09 DIAGNOSIS — C50411 Malignant neoplasm of upper-outer quadrant of right female breast: Secondary | ICD-10-CM | POA: Insufficient documentation

## 2020-04-09 DIAGNOSIS — K7689 Other specified diseases of liver: Secondary | ICD-10-CM | POA: Diagnosis not present

## 2020-04-09 DIAGNOSIS — F329 Major depressive disorder, single episode, unspecified: Secondary | ICD-10-CM | POA: Diagnosis not present

## 2020-04-09 DIAGNOSIS — Z79899 Other long term (current) drug therapy: Secondary | ICD-10-CM | POA: Diagnosis not present

## 2020-04-09 DIAGNOSIS — F419 Anxiety disorder, unspecified: Secondary | ICD-10-CM | POA: Insufficient documentation

## 2020-04-09 DIAGNOSIS — I2510 Atherosclerotic heart disease of native coronary artery without angina pectoris: Secondary | ICD-10-CM | POA: Insufficient documentation

## 2020-04-09 DIAGNOSIS — C773 Secondary and unspecified malignant neoplasm of axilla and upper limb lymph nodes: Secondary | ICD-10-CM | POA: Diagnosis not present

## 2020-04-09 DIAGNOSIS — E039 Hypothyroidism, unspecified: Secondary | ICD-10-CM | POA: Insufficient documentation

## 2020-04-09 DIAGNOSIS — I110 Hypertensive heart disease with heart failure: Secondary | ICD-10-CM | POA: Insufficient documentation

## 2020-04-09 DIAGNOSIS — C182 Malignant neoplasm of ascending colon: Secondary | ICD-10-CM | POA: Diagnosis not present

## 2020-04-09 DIAGNOSIS — Z170 Estrogen receptor positive status [ER+]: Secondary | ICD-10-CM | POA: Insufficient documentation

## 2020-04-09 DIAGNOSIS — R636 Underweight: Secondary | ICD-10-CM | POA: Insufficient documentation

## 2020-04-09 DIAGNOSIS — E876 Hypokalemia: Secondary | ICD-10-CM | POA: Insufficient documentation

## 2020-04-09 DIAGNOSIS — Z79810 Long term (current) use of selective estrogen receptor modulators (SERMs): Secondary | ICD-10-CM | POA: Diagnosis not present

## 2020-04-09 LAB — CBC WITH DIFFERENTIAL (CANCER CENTER ONLY)
Abs Immature Granulocytes: 0.01 10*3/uL (ref 0.00–0.07)
Basophils Absolute: 0 10*3/uL (ref 0.0–0.1)
Basophils Relative: 1 %
Eosinophils Absolute: 0.1 10*3/uL (ref 0.0–0.5)
Eosinophils Relative: 1 %
HCT: 36.6 % (ref 36.0–46.0)
Hemoglobin: 12 g/dL (ref 12.0–15.0)
Immature Granulocytes: 0 %
Lymphocytes Relative: 17 %
Lymphs Abs: 0.8 10*3/uL (ref 0.7–4.0)
MCH: 31.7 pg (ref 26.0–34.0)
MCHC: 32.8 g/dL (ref 30.0–36.0)
MCV: 96.8 fL (ref 80.0–100.0)
Monocytes Absolute: 0.6 10*3/uL (ref 0.1–1.0)
Monocytes Relative: 12 %
Neutro Abs: 3.5 10*3/uL (ref 1.7–7.7)
Neutrophils Relative %: 69 %
Platelet Count: 213 10*3/uL (ref 150–400)
RBC: 3.78 MIL/uL — ABNORMAL LOW (ref 3.87–5.11)
RDW: 15.2 % (ref 11.5–15.5)
WBC Count: 5.1 10*3/uL (ref 4.0–10.5)
nRBC: 0 % (ref 0.0–0.2)

## 2020-04-09 LAB — CMP (CANCER CENTER ONLY)
ALT: 12 U/L (ref 0–44)
AST: 21 U/L (ref 15–41)
Albumin: 3.1 g/dL — ABNORMAL LOW (ref 3.5–5.0)
Alkaline Phosphatase: 51 U/L (ref 38–126)
Anion gap: 7 (ref 5–15)
BUN: 25 mg/dL — ABNORMAL HIGH (ref 8–23)
CO2: 27 mmol/L (ref 22–32)
Calcium: 9.7 mg/dL (ref 8.9–10.3)
Chloride: 106 mmol/L (ref 98–111)
Creatinine: 0.78 mg/dL (ref 0.44–1.00)
GFR, Est AFR Am: >60 mL/min (ref >60)
GFR, Estimated: >60 mL/min (ref >60)
Glucose, Bld: 98 mg/dL (ref 70–99)
Potassium: 4.5 mmol/L (ref 3.5–5.1)
Sodium: 140 mmol/L (ref 135–145)
Total Bilirubin: 0.3 mg/dL (ref 0.3–1.2)
Total Protein: 6.9 g/dL (ref 6.5–8.1)

## 2020-04-10 ENCOUNTER — Encounter: Payer: Self-pay | Admitting: Hematology

## 2020-04-13 ENCOUNTER — Telehealth: Payer: Self-pay | Admitting: Hematology

## 2020-04-13 NOTE — Telephone Encounter (Signed)
Scheduled per 8/20 los. Unable to reach pt. Left voicemail with appt time and date.

## 2020-04-30 ENCOUNTER — Other Ambulatory Visit: Payer: Self-pay

## 2020-04-30 ENCOUNTER — Encounter (HOSPITAL_COMMUNITY): Payer: Self-pay

## 2020-04-30 ENCOUNTER — Ambulatory Visit (HOSPITAL_COMMUNITY)
Admission: RE | Admit: 2020-04-30 | Discharge: 2020-04-30 | Disposition: A | Discharge status: Home / Self Care | Payer: Medicare Other | Source: Ambulatory Visit | Attending: Hematology | Admitting: Hematology

## 2020-04-30 DIAGNOSIS — I700 Atherosclerosis of aorta: Secondary | ICD-10-CM | POA: Diagnosis not present

## 2020-04-30 DIAGNOSIS — C50911 Malignant neoplasm of unspecified site of right female breast: Secondary | ICD-10-CM | POA: Diagnosis not present

## 2020-04-30 DIAGNOSIS — C182 Malignant neoplasm of ascending colon: Secondary | ICD-10-CM | POA: Insufficient documentation

## 2020-04-30 DIAGNOSIS — D1809 Hemangioma of other sites: Secondary | ICD-10-CM | POA: Diagnosis not present

## 2020-04-30 DIAGNOSIS — I2510 Atherosclerotic heart disease of native coronary artery without angina pectoris: Secondary | ICD-10-CM | POA: Diagnosis not present

## 2020-04-30 MED ORDER — IOHEXOL 300 MG/ML  SOLN
75.0000 mL | Freq: Once | INTRAMUSCULAR | Status: AC | PRN
  Administered 2020-04-30: 75 mL via INTRAVENOUS

## 2020-05-06 ENCOUNTER — Other Ambulatory Visit: Payer: Self-pay | Admitting: *Deleted

## 2020-05-06 ENCOUNTER — Telehealth: Payer: Self-pay | Admitting: Hematology

## 2020-05-06 NOTE — Telephone Encounter (Signed)
I presented her recent CT chest to thoracic tumor conference this morning, and compared to her last PET scan from December 2020.  Both of her right and left lung groundglass nodules are unchanged.  The consensus is monitoring, due to her advanced age and medical comorbidities.  I plan to repeat CT chest in 6 to 9 months.   I called patient, and reviewed the above discussion.  She is in agreement.  I will mail her a copy of the scan report, she is scheduled for routine follow-up with me in December 2021.  [name]  05/06/2020

## 2020-05-06 NOTE — Progress Notes (Signed)
The proposed treatment discussed in cancer conference 05/06/20 is for discussion purpose only and is not a binding recommendation.  The patient was not physically examined nor present for their treatment options.  Therefore, final treatment plans cannot be decided.  

## 2020-05-24 DIAGNOSIS — H353122 Nonexudative age-related macular degeneration, left eye, intermediate dry stage: Secondary | ICD-10-CM | POA: Diagnosis not present

## 2020-05-24 DIAGNOSIS — H401331 Pigmentary glaucoma, bilateral, mild stage: Secondary | ICD-10-CM | POA: Diagnosis not present

## 2020-05-26 DIAGNOSIS — Z23 Encounter for immunization: Secondary | ICD-10-CM | POA: Diagnosis not present

## 2020-06-21 DIAGNOSIS — M810 Age-related osteoporosis without current pathological fracture: Secondary | ICD-10-CM | POA: Diagnosis not present

## 2020-07-06 NOTE — Progress Notes (Signed)
[facility] Note  07/07/2020     CHIEF COMPLAINT Patient presents for Retina Follow Up   HISTORY OF PRESENT ILLNESS: Marissa Caldwell is a 86 y.o. female who presents to the clinic today for:   HPI    Retina Follow Up    Patient presents with  Wet AMD.  Since onset it is stable.  I, the attending physician,  performed the HPI with the patient and updated documentation appropriately.          Comments    14 week follow up Exu ARMD OD, Dry ARMD OS- Eyes are about the same, some days better than others states pt.   Brimonidine BID OU, Latanoprost qhs OU, ATs 2-3 x/d       Last edited by Bernarda Caffey, MD on 07/07/2020  1:45 PM. (History)    pt states she is doing well   Referring physician:  Hortencia Pilar, MD [place],  [place] [address]  HISTORICAL INFORMATION:   Selected notes from the MEDICAL RECORD NUMBER Referred by Dr. Quentin Ore for concern of exudative ARMD OD   CURRENT MEDICATIONS: Current Outpatient Medications (Ophthalmic Drugs)  Medication Sig  . brimonidine (ALPHAGAN) 0.2 % ophthalmic solution Place 1 drop into the left eye 2 (two) times daily.   Marland Kitchen latanoprost (XALATAN) 0.005 % ophthalmic solution Place 1 drop into both eyes at bedtime.   Current Facility-Administered Medications (Ophthalmic Drugs)  Medication Route  . aflibercept (EYLEA) SOLN 2 mg Intravitreal  . aflibercept (EYLEA) SOLN 2 mg Intravitreal  . aflibercept (EYLEA) SOLN 2 mg Intravitreal  . aflibercept (EYLEA) SOLN 2 mg Intravitreal  . aflibercept (EYLEA) SOLN 2 mg Intravitreal   Current Outpatient Medications (Other)  Medication Sig  . aspirin EC 81 MG tablet Take 81 mg by mouth daily.  . budesonide-formoterol (SYMBICORT) 160-4.5 MCG/ACT inhaler Inhale 2 puffs into the lungs 2 (two) times daily.  . Calcium Carb-Cholecalciferol (CALCIUM + D3) 600-200 MG-UNIT TABS Take 1 tablet by mouth daily.  . Cholecalciferol (VITAMIN D3)  2000 units capsule Take 4,000 Units by mouth daily.  . Coenzyme Q10 (COQ10) 100 MG CAPS Take 100 mg by mouth daily.  Marland Kitchen levothyroxine (SYNTHROID, LEVOTHROID) 100 MCG tablet Take 100 mcg by mouth daily before breakfast.   . LORazepam (ATIVAN) 0.5 MG tablet Take 0.5 mg by mouth 2 (two) times daily.   . LUTEIN PO Take 1 tablet by mouth daily.  . mirtazapine (REMERON) 15 MG tablet Take 15 mg by mouth at bedtime.  . Multiple Vitamin (MULTIVITAMIN) tablet Take 1 tablet by mouth daily.  . nitroGLYCERIN (NITROSTAT) 0.4 MG SL tablet Place 1 tablet (0.4 mg total) under the tongue every 5 (five) minutes as needed for chest pain.  . Omega-3 Fatty Acids (FISH OIL PO) Take 1 capsule by mouth daily.  . pravastatin (PRAVACHOL) 40 MG tablet TAKE 1 TABLET BY MOUTH DAILY GENERIC EQUIVALENT FOR PRAVACHOL  . tamoxifen (NOLVADEX) 20 MG tablet Take 1 tablet (20 mg total) by mouth daily.  . TOVIAZ 4 MG TB24 tablet Take 4 mg by mouth daily.   . traMADol (ULTRAM) 50 MG tablet Take 1 tablet (50 mg total) by mouth every 6 (six) hours as needed.  Marland Kitchen albuterol (PROVENTIL HFA;VENTOLIN HFA) 108 (90 Base) MCG/ACT inhaler Inhale 2 puffs into the lungs every 6 (six) hours as needed (for shortness of breath/wheezing.).    Current Facility-Administered Medications (Other)  Medication Route  . Bevacizumab (AVASTIN) SOLN  1.25 mg Intravitreal  . Bevacizumab (AVASTIN) SOLN 1.25 mg Intravitreal  . Bevacizumab (AVASTIN) SOLN 1.25 mg Intravitreal      REVIEW OF SYSTEMS: ROS    Positive for: HENT, Endocrine, Cardiovascular, Eyes, Respiratory, Heme/Lymph   Negative for: Constitutional, Gastrointestinal, Neurological, Skin, Genitourinary, Musculoskeletal, Psychiatric, Allergic/Imm   Last edited by Leonie Douglas, COA on 07/07/2020  1:09 PM. (History)       ALLERGIES Allergies  Allergen Reactions  . Lipitor [Atorvastatin]     Elevated LFT's    PAST MEDICAL HISTORY Past Medical History:  Diagnosis Date  . Anxiety   .  Arthritis   . Breast cancer ([place])   . CAD (coronary artery disease)    a. 03/2017: 95% LCx stenosis --> PCI/DES placed  . Cataract   . Chest pain 03/27/2017  . Chronic diastolic heart failure ([place]) 06/25/2017  . Chronic lower back pain   . Colon cancer ([place]) 11/2019   s/p colectomy  . COPD (chronic obstructive pulmonary disease) ([place])   . Coronary artery disease   . Diverticulitis   . Diverticulosis   . GERD (gastroesophageal reflux disease)   . Glaucoma   . Heart failure, type unknown ([place]) 03/11/2017  . History of radiation therapy 08/05/19- 09/03/19   Right Breast/ SCV 16 fractions of 2.66 Gy each for a total of 42.56 Gy. Right breast boost 4 fractions of 2 Gy each to total 8 Gy.   Marland Kitchen Hyperlipidemia 03/11/2017  . Hypertension   . Hypothyroidism   . Macular degeneration    wet and dry per pt's son  . Personal history of chemotherapy   . Personal history of radiation therapy   . Pneumonia X 1   "years and years ago" (03/26/2017)  . Shortness of breath 03/11/2017  . Status post dilation of esophageal narrowing    Past Surgical History:  Procedure Laterality Date  . BILATERAL OOPHORECTOMY Bilateral   . BREAST LUMPECTOMY Right 06/19/2019  . BREAST LUMPECTOMY WITH RADIOACTIVE SEED AND SENTINEL LYMPH NODE BIOPSY Right 06/19/2019   Procedure: RIGHT BREAST LUMPECTOMY WITH RADIOACTIVE SEED AND SENTINEL LYMPH NODE BIOPSY;  Surgeon: Jovita Kussmaul, MD;  Location: [facility];  Service: General;  Laterality: Right;  . CATARACT EXTRACTION    . CATARACT EXTRACTION W/ INTRAOCULAR LENS  IMPLANT, BILATERAL Bilateral   . COLOSTOMY     Greater than 10 yrs  . CORONARY ANGIOPLASTY WITH STENT PLACEMENT  03/26/2017  . LAPAROSCOPIC RIGHT COLECTOMY Right 12/15/2019   Procedure: LAPAROSCOPIC ASSISTED RIGHT COLECTOMY;  Surgeon: Jovita Kussmaul, MD;  Location: [facility];  Service: General;  Laterality: Right;  . LEFT HEART CATH AND CORONARY ANGIOGRAPHY N/A 03/26/2017   Procedure: Left Heart Cath and Coronary Angiography;   Surgeon: Belva Crome, MD;  Location: [facility];  Service: Cardiovascular;  Laterality: N/A;  . NERVE SURGERY     "lump on my sciatic nerve was removed years ago"  . SHOULDER OPEN ROTATOR CUFF REPAIR Right   . THYROIDECTOMY, PARTIAL    . TONSILLECTOMY    . UPPER GASTROINTESTINAL ENDOSCOPY     > ten yrs    FAMILY HISTORY Family History  Problem Relation Age of Onset  . CAD Mother   . Cataracts Mother   . Coronary artery disease Father   . Leukemia Sister   . Amblyopia Neg Hx   . Blindness Neg Hx   . Diabetes Neg Hx   . Glaucoma Neg Hx   . Macular degeneration Neg Hx   .  Retinal detachment Neg Hx   . Strabismus Neg Hx   . Retinitis pigmentosa Neg Hx   . Colon cancer Neg Hx   . Colon polyps Neg Hx   . Esophageal cancer Neg Hx   . Rectal cancer Neg Hx   . Stomach cancer Neg Hx     SOCIAL HISTORY Social History   Tobacco Use  . Smoking status: Current Some Day Smoker    Packs/day: 0.50    Years: 60.00    Pack years: 30.00    Types: Cigarettes  . Smokeless tobacco: Never Used  Vaping Use  . Vaping Use: Never used  Substance Use Topics  . Alcohol use: Yes    Comment: twice a month  . Drug use: No         OPHTHALMIC EXAM:  Base Eye Exam    Visual Acuity (Snellen - Linear)      Right Left   Dist cc CF 2' 20/80 +2   Dist ph cc NI NI   Correction: Glasses       Tonometry (Tonopen, 1:21 PM)      Right Left   Pressure 12 13       Pupils      Dark Light Shape React APD   Right 4  Round  +1   Left 4 3 Round Brisk None       Visual Fields   Poor understanding with CVF, unreliable OU.       Extraocular Movement      Right Left    Full Full       Neuro/Psych    Oriented x3: Yes   Mood/Affect: Normal       Dilation    Both eyes: 1.0% Mydriacyl, 2.5% Phenylephrine @ 1:21 PM        Slit Lamp and Fundus Exam    Slit Lamp Exam      Right Left   Lids/Lashes Dermatochalasis - upper lid, Telangiectasia, Meibomian gland dysfunction -  improving Dermatochalasis - upper lid, Telangiectasia   Conjunctiva/Sclera White and quiet White and quiet   Cornea Arcus, mild Debris in tear film Arcus, 2-3+ diffuse Punctate epithelial erosions, endopigment   Anterior Chamber Deep and quiet Deep and quiet   Iris Round and dilated Round and dilated   Lens Three piece PC IOL in good position, open PC Three piece PC IOL in good position, clear PC   Vitreous Vitreous syneresis, Posterior vitreous detachment, mild vitreous condensations Mild Vitreous syneresis, Posterior vitreous detachment       Fundus Exam      Right Left   Disc sharp rim, 360 Peripapillary atrophy, 2-3+Pallor 360 Peripapillary atrophy, trace temporal pallor, sharp rim, mild Pallor   C/D Ratio 0.2 0.1   Macula Blunted foveal reflex, Drusen, RPE mottling and clumping, +PED, +CNVM, sub-retinal Disciform scar Blunted foveal reflex, Drusen, RPE mottling, clumping, and Atrophy, focal GA nasal macula--slightly increased, no heme   Vessels attenuated, mild tortuousity Vascular attenuation   Periphery Attached Attached        Refraction    Wearing Rx      Sphere Cylinder Axis   Right -1.00 +1.50 014   Left -1.25 +1.25 174       Manifest Refraction      Sphere Cylinder Axis Dist VA   Right       Left -1.75 +1.00 175 20/60+1          IMAGING AND PROCEDURES  Imaging and Procedures for @TODAY @  OCT,  Retina - OU - Both Eyes       Right Eye Quality was good. Central Foveal Thickness: 529. Progression has been stable. Findings include subretinal hyper-reflective material, choroidal neovascular membrane, retinal drusen , epiretinal membrane, disciform scar, abnormal foveal contour, pigment epithelial detachment, no IRF, no SRF (persistent thick PED/SRHM/disciform scar).   Left Eye Quality was good. Central Foveal Thickness: 243. Progression has been stable. Findings include abnormal foveal contour, retinal drusen , no IRF, no SRF, outer retinal atrophy, pigment  epithelial detachment (Mild interval progression of GA area on en face image).   Notes *Images captured and stored on drive  Diagnosis / Impression:  OD: Exudative AMD with significant submacular scar/SRHM --  persistent thick PED/SRHM/disciform scar -- no IRF/SRF OS: Non-exudative AMD w/ GA - stable from prior  Clinical management:  See below  Abbreviations: NFP - Normal foveal profile. CME - cystoid macular edema. PED - pigment epithelial detachment. IRF - intraretinal fluid. SRF - subretinal fluid. EZ - ellipsoid zone. ERM - epiretinal membrane. ORA - outer retinal atrophy. ORT - outer retinal tubulation. SRHM - subretinal hyper-reflective material                  ASSESSMENT/PLAN:    ICD-10-CM   1. 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 Retinal edema  H35.81 OCT, Retina - OU - Both Eyes  4. Posterior vitreous detachment of both eyes  H43.813   5. Pigmentary glaucoma of both eyes, mild stage  H40.1331   6. Pseudophakia of both eyes  Z96.1     1. 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 Kathlen Mody  - S/P IVA OD #1 (05.20.19), #2 (07.02.19), #3 (07.30.19)  - S/P IVE OD #1 (08.27.19), #2 (09.24.19), #3 (11.05.19), #4 (12.17.19), #5 (02.21.20), #6 (05.05.20), # 7 (08.05.20), #8 (9.2.20), #9 (11.10.20), #10 (02.12.21), #11 (05.12.21), #12 (08.13.21)  - VA remains CF  - OCT with stable improvment in peripapillary / nasal macula IRF and SRF; persistent thick PED/SRHM/disciform scar  - discussed findings and guarded prognosis and treatment options  - switch in therapy to Eylea approved through [employer]  - due to stable, inactive submacular scar and VA CF -- recommend holding off on injection today  - pt in agreement  - Eylea informed consent form signed  and scanned on 11.10.2020  - Eylea paperwork and benefits investigation started on 07.30.19 -- Approved for 2021 [employer]  - f/u in 4 months -- DFE/OCT  2. Age related macular degeneration, non-exudative, left eye  - mild progression of geographic atrophy on exam and OCT today  - The incidence, anatomy, and pathology of dry AMD, risk of progression, and the AREDS and AREDS 2 study including smoking risks discussed with patient.  - continue amsler grid monitoring  3. Retinal edema as above  4. PVD / vitreous syneresis OU  Discussed findings and prognosis  No RT or RD on 360 peripheral exam  Reviewed s/s of RT/RD  Strict return precautions for any such RT/RD signs/symptoms  5. Pigmentary glaucoma OU-   - using latanoprost OU qhs  - IOP good today (12, 13)  - under the expert care of Dr. Quentin Ore  - monitor  6. Pseudophakia OU  - s/p CE/IOL OU  - beautiful surgery, doing well  -  monitor   Ophthalmic Meds Ordered this visit:  No orders of the defined types were placed in this encounter.      Return in about 4 months (around 11/04/2020) for f/u exu ARMD OD, DFE, OCT.  There are no Patient Instructions on file for this visit.  This document serves as a record of services personally performed by Gardiner Sleeper, MD, PhD. It was created on their behalf by Leeann Must, [place], an ophthalmic technician. The creation of this record is the provider's dictation and/or activities during the visit.    Electronically signed by: Leeann Must, [place] 11.16.2021 9:59 PM   This document serves as a record of services personally performed by Gardiner Sleeper, MD, PhD. It was created on their behalf by San Jetty. Owens Shark, OA an ophthalmic technician. The creation of this record is the provider's dictation and/or activities during the visit.    Electronically signed by: San Jetty. Owens Shark, [place] 11.17.2021 9:59 PM  Gardiner Sleeper, M.D., Ph.D. Diseases & Surgery of the Retina and [place] 07/07/2020   I have reviewed the above documentation for accuracy and completeness, and I agree with the above. Gardiner Sleeper, M.D., Ph.D. 07/07/20 9:59 PM   Abbreviations: M myopia (nearsighted); A astigmatism; H hyperopia (farsighted); P presbyopia; Mrx spectacle prescription;  CTL contact lenses; OD right eye; OS left eye; OU both eyes  XT exotropia; ET esotropia; PEK punctate epithelial keratitis; PEE punctate epithelial erosions; DES dry eye syndrome; MGD meibomian gland dysfunction; ATs artificial tears; PFAT's preservative free artificial tears; [place] nuclear sclerotic cataract; PSC posterior subcapsular cataract; ERM epi-retinal membrane; PVD posterior vitreous detachment; RD retinal detachment; DM diabetes mellitus; DR diabetic retinopathy; NPDR non-proliferative diabetic retinopathy; PDR proliferative diabetic retinopathy; CSME clinically significant macular edema; DME diabetic macular edema; dbh dot blot hemorrhages; CWS cotton wool spot; POAG primary open angle glaucoma; C/D cup-to-disc ratio; HVF humphrey visual field; GVF goldmann visual field; OCT optical coherence tomography; IOP intraocular pressure; BRVO Branch retinal vein occlusion; CRVO central retinal vein occlusion; CRAO central retinal artery occlusion; BRAO branch retinal artery occlusion; RT retinal tear; SB scleral buckle; PPV pars plana vitrectomy; VH Vitreous hemorrhage; PRP panretinal laser photocoagulation; IVK intravitreal kenalog; VMT vitreomacular traction; MH Macular hole;  NVD neovascularization of the disc; NVE neovascularization elsewhere; AREDS age related eye disease study; ARMD age related macular degeneration; POAG primary open angle glaucoma; EBMD epithelial/anterior basement membrane dystrophy; ACIOL anterior chamber intraocular lens; IOL intraocular lens; PCIOL posterior chamber intraocular lens; Phaco/IOL phacoemulsification with intraocular lens placement; [place] photorefractive  keratectomy; LASIK laser assisted in situ keratomileusis; HTN hypertension; DM diabetes mellitus; COPD chronic obstructive pulmonary disease

## 2020-07-07 ENCOUNTER — Encounter (INDEPENDENT_AMBULATORY_CARE_PROVIDER_SITE_OTHER): Payer: Self-pay | Admitting: Ophthalmology

## 2020-07-07 ENCOUNTER — Other Ambulatory Visit: Payer: Self-pay

## 2020-07-07 ENCOUNTER — Ambulatory Visit (INDEPENDENT_AMBULATORY_CARE_PROVIDER_SITE_OTHER): Payer: Medicare Other | Admitting: Ophthalmology

## 2020-07-07 DIAGNOSIS — H353213 Exudative age-related macular degeneration, right eye, with inactive scar: Secondary | ICD-10-CM | POA: Diagnosis not present

## 2020-07-07 DIAGNOSIS — Z961 Presence of intraocular lens: Secondary | ICD-10-CM

## 2020-07-07 DIAGNOSIS — H43813 Vitreous degeneration, bilateral: Secondary | ICD-10-CM

## 2020-07-07 DIAGNOSIS — H3581 Retinal edema: Secondary | ICD-10-CM

## 2020-07-07 DIAGNOSIS — H353122 Nonexudative age-related macular degeneration, left eye, intermediate dry stage: Secondary | ICD-10-CM | POA: Diagnosis not present

## 2020-07-07 DIAGNOSIS — H353211 Exudative age-related macular degeneration, right eye, with active choroidal neovascularization: Secondary | ICD-10-CM

## 2020-07-07 DIAGNOSIS — H401331 Pigmentary glaucoma, bilateral, mild stage: Secondary | ICD-10-CM

## 2020-08-04 NOTE — Progress Notes (Signed)
[place]   [phone] [phone]   Clinic Follow up Note   Patient Care Team: [name], MD as PCP - General Skeet Latch, MD as PCP - Cardiology (Cardiology) Rockwell Germany, RN as Oncology Nurse Navigator Mauro Kaufmann, RN as Oncology Nurse Navigator Jovita Kussmaul, MD as Consulting Physician (General Surgery) [name], MD as Consulting Physician (Hematology) Eppie Gibson, MD as Attending Physician (Radiation Oncology)  Date of Service:  08/06/2020  CHIEF COMPLAINT: F/u of right breast cancer and colon cancer   SUMMARY OF ONCOLOGIC HISTORY: Oncology History Overview Note  Cancer Staging Cancer of right colon [facility]) Staging form: Colon and Rectum, AJCC 8th Edition - Pathologic stage from 12/15/2019: Stage I (pT2, pN0, cM0) - Signed by [name], MD on 01/08/2020  Malignant neoplasm of upper-outer quadrant of right breast in female, estrogen receptor positive ([place]) Staging form: Breast, AJCC 8th Edition - Clinical: Stage IIA (cT1c, cN1, cM0, G3, ER+, PR+, HER2-) - Signed by [name], MD on 04/09/2019 - Pathologic stage from 06/19/2019: Stage IB (pT1c, pN2a, cM0, G2, ER+, PR+, HER2-) - Signed by [name], MD on 07/03/2019    Malignant neoplasm of upper-outer quadrant of right breast in female, estrogen receptor positive ([place])  03/19/2019 Mammogram   Diagnostic Mammogram 03/19/19  IMPRESSION The 2 cm distortion in the  right breast 9:30 position middle depth 3cm from the nipple is indeterminate. [place] recommended.  There is also a 1.2cmx3.2cm enlarged right axillary LN highly suggestive of malignancy.     03/31/2019 Initial Biopsy   Diagnosis 03/31/19  1. Breast, right, needle core biopsy, 9:30 o'clock, mid depth, 3cmfn - INVASIVE DUCTAL CARCINOMA. - LYMPHOVASCULAR INVASION IS IDENTIFIED. - SEE COMMENT. 2. Lymph node, needle/core biopsy, right axilla - INVASIVE DUCTAL CARCINOMA. - SEE COMMENT.   03/31/2019 Receptors her2    1. PROGNOSTIC INDICATORS Results: IMMUNOHISTOCHEMICAL AND MORPHOMETRIC ANALYSIS PERFORMED MANUALLY The tumor cells are NEGATIVE for Her2 (0). Estrogen Receptor: 100%, POSITIVE, STRONG STAINING INTENSITY Progesterone Receptor: 10%, POSITIVE, STRONG STAINING INTENSITY Proliferation Marker Ki67: 10%  2. PROGNOSTIC INDICATORS Results: IMMUNOHISTOCHEMICAL AND MORPHOMETRIC ANALYSIS PERFORMED MANUALLY Estrogen Receptor: 100%, POSITIVE, STRONG STAINING INTENSITY Progesterone Receptor: 0%, NEGATIVE Proliferation Marker Ki67: 40%   04/03/2019 Initial Diagnosis   Malignant neoplasm of upper-outer quadrant of right breast in female, estrogen receptor positive ([place])   04/09/2019 Cancer Staging   Staging form: Breast, AJCC 8th Edition - Clinical: Stage IIA (cT1c, cN1, cM0, G3, ER+, PR+, HER2-) - Signed by [name], MD on 04/09/2019  Staging form: Breast, AJCC 8th Edition - Clinical: Stage IIA (cT1c, cN1, cM0, G3, ER+, PR+, HER2-) - Signed by [name], MD on 04/09/2019   03/2019 -  Anti-estrogen oral therapy   Tamoxifen 35m daily starting 03/2019. Held 05/30/19 -07/2019 for surgery.   06/19/2019 Surgery   RIGHT BREAST LUMPECTOMY WITH RADIOACTIVE SEED AND SENTINEL LYMPH NODE BIOPSY by Dr [name] 06/19/19    06/19/2019 Pathology Results   FINAL MICROSCOPIC DIAGNOSIS: 06/19/19  A. BREAST, RIGHT, LUMPECTOMY:  -  Mucinous carcinoma, Nottingham grade 2  -  Margins uninvolved by carcinoma (0.2 cm; medial margin)  -  Lymphovascular space invasion present  -  Previous biopsy site changes present  -  See oncology table and comment below   B. LYMPH NODE, RIGHT AXILLARY #1, SENTINEL, BIOPSY:  -  Metastatic carcinoma involving two lymph nodes (2/2)  -  Extracapsular extension present  -  See comment   C. BREAST, RIGHT MEDIAL MARGIN, EXCISION:  -  Residual invasive carcinoma  -  Usual ductal hyperplasia with calcifications  -  See comment   D. LYMPH NODE, RIGHT AXILLARY #2, SENTINEL, BIOPSY:  -   Metastatic carcinoma involving one lymph node (1/1)   E. LYMPH NODE, RIGHT AXILLARY #1 , BIOPSY:  -  Metastatic carcinoma involving one lymph node (1/1)   F. LYMPH NODE, RIGHT AXILLARY #2, BIOPSY:  -  No carcinoma identified in one lymph node (0/1)    06/19/2019 Cancer Staging   Staging form: Breast, AJCC 8th Edition - Pathologic stage from 06/19/2019: Stage IB (pT1c, pN2a, cM0, G2, ER+, PR+, HER2-) - Signed by [name], MD on 07/03/2019   07/22/2019 PET scan   IMPRESSION: 1. Low level hypermetabolism identified in the surgical beds of the right breast and right axilla. This is presumably related to the surgery. 2. Tiny right subpectoral lymph nodes are asymmetric in concerning for metastatic disease on CT imaging. No hypermetabolism on the PET study although size of these lymph nodes is below reliable resolution on PET imaging. 3. 10 mm short axis precarinal lymph node shows low level hypermetabolism. Metastatic involvement not excluded. 4. 11 mm ground-glass nodule in right upper lobe shows low level FDG accumulation. Well differentiated or low-grade neoplasm is a distinct concern. Close follow-up recommended. 5. Focal hypermetabolism in the right colon, potentially related to the ileocecal valve but incompletely characterized. Adenoma or carcinoma could have this appearance. Correlation with colorectal cancer screening history recommended. 6. Focal hypermetabolism identified in the region of the left pubic bone and adjacent sigmoid colon. No underlying bony or colonic lesion evident. Metastatic involvement of the left pubic bone not excluded. 7.  Aortic Atherosclerois (ICD10-170.0)     08/05/2019 - 09/03/2019 Radiation Therapy   Adjuvant RT with Dr Isidore Moos 08/05/19-09/03/19.    Cancer of right colon ([place])  10/15/2019 Procedure   Colonoscopy by Dr Silverio Decamp  IMPRESSION - Likely malignant tumor in the proximal ascending colon. Biopsied. Tattooed. - Severe diverticulosis in  the sigmoid colon, in the descending colon, in the transverse colon and in the ascending colon. - Non-bleeding internal hemorrhoids.   10/15/2019 Initial Biopsy   Diagnosis Ascending Colon Polyp, mass - ADENOCARCINOMA. Microscopic Comment IHC for MMR will be reported separately. Results reported to Dr. Harl Bowie on 10/16/2019. Intradepartmental consultation (Dr. Vic Ripper).   12/15/2019 Initial Diagnosis   Cancer of right colon ([place])   12/15/2019 Cancer Staging   Staging form: Colon and Rectum, AJCC 8th Edition - Pathologic stage from 12/15/2019: Stage I (pT2, pN0, cM0) - Signed by [name], MD on 01/08/2020   12/15/2019 Surgery   LAPAROSCOPIC ASSISTED RIGHT COLECTOMY by Dr Marlou Starks and Dr Marcello Moores   12/15/2019 Pathology Results   FINAL MICROSCOPIC DIAGNOSIS:   A. COLON, TERMINAL ILEUM AND RIGHT, COLECTOMY:  - Invasive moderately differentiated adenocarcinoma, 4.6 cm, involving  ascending colon  - Carcinoma invades into deeper muscularis propria but not beyond  - Resection margins are negative for carcinoma  - Negative for lymphovascular or perineural invasion  - Unremarkable appendix  - Nineteen lymph nodes, negative for carcinoma (0/19)  - See oncology table    12/15/2019 Genetic Testing   ADDENDUM:   Mismatch Repair Protein (IHC)  SUMMARY INTERPRETATION: ABNORMAL  There is loss of the major and minor MMR proteins MLH1 and PMS2. The  loss of expression may be secondary to promoter hyper-methylation, gene  mutation or other genetic event. BRAF mutation testing and/or MLH1  methylation testing is indicated. The presence of a BRAF mutation and/or  MLH1 hypermethylation is indicative of a sporadic-type tumor. The  absence of either BRAF mutation and/or presence of normal methylation  indicate the possible presence of a hereditary germline mutation (e.g.  Lynch syndrome) and referral to genetic counseling is warranted. It is  recommended that the loss of protein expression be  correlated with  molecular based MSI testing.   IHC EXPRESSION RESULTS  TEST           RESULT  MLH1:          LOSS OF NUCLEAR EXPRESSION  MSH2:          Preserved nuclear expression  MSH6:          Preserved nuclear expression  PMS2:          LOSS OF NUCLEAR EXPRESSION    12/29/2019 Imaging   CT AP W contrast  IMPRESSION: 1. Sigmoid diverticulosis without inflammation. Status post right colectomy. 2. Multiple small calcified uterine fibroids. 3. No acute abnormality seen in the abdomen or pelvis.   Aortic Atherosclerosis (ICD10-I70.0).     01/28/2020 Imaging   CT AP w contrast  IMPRESSION: 1. Moderate irregular wall thickening of the right colon with some areas of low attenuation. Findings could be due to cecal diverticulitis or other inflammatory or infectious colitis. I doubt that this represents recurrent colon cancer. 2. Status post partial right colectomy. No findings for locoregional adenopathy or metastatic disease. 3. Stable advanced atherosclerotic calcifications involving the aorta and branch vessels. 4. Stable benign-appearing central hepatic cysts. 5. Stable calcified uterine fibroids. 6. Aortic atherosclerosis.   Aortic Atherosclerosis (ICD10-I70.0).   04/30/2020 Imaging   CT Chest  IMPRESSION: 1. Ground-glass nodule in the RIGHT upper lobe measuring 1.4 x 0.9 cm previously 1.4 x 0.9 cm. 2. Ground-glass nodule in the LEFT upper lobe measuring 11 x 12 mm, spiculated margins and mild fissural distortion, accompanying features of this nodule. Findings are concerning for for multifocal bronchogenic neoplasm given persistence. 3. Stable size of small nodes along the RIGHT paratracheal chain largest approximately 9 mm. 4. Stable hepatic hemangioma. 5. Dilation of the ascending aorta to 3.7 cm, similar to previous exams. 6. Three-vessel coronary artery disease. 7. Aortic atherosclerosis.   Aortic Atherosclerosis (ICD10-I70.0).        CURRENT THERAPY:   Tamoxifen 34m once daily since 03/2019. Held 05/2019-07/2019 for surgery.    INTERVAL HISTORY:  Marissa Caldwell is here for a follow up of right breast cancer. She presents to the clinic with her son. She notes the scar tissue in her right breast is getting bigger since last visit. Until today. She notes right side pain when she lays on that side. She notes having RLQ pain which she will occasionally feel a knot. She has stable known back pain. She has had injections before without help. She is on Tamoxifen and tolerating well. Her son notes she continues to do well living by herself.     REVIEW OF SYSTEMS:   Constitutional: Denies fevers, chills or abnormal weight loss Eyes: Denies blurriness of vision Ears, nose, mouth, throat, and face: Denies mucositis or sore throat Respiratory: Denies cough, dyspnea or wheezes Cardiovascular: Denies palpitation, chest discomfort or lower extremity swelling Gastrointestinal:  Denies nausea, heartburn or change in bowel habits (+) RLQ pain  Skin: Denies abnormal skin rashes Lymphatics: Denies new lymphadenopathy or easy bruising Neurological:Denies numbness, tingling or new weaknesses Behavioral/Psych: Mood is stable, no new changes  Breast: (+) Increase size of right breast scar tissue (+) Right side pain  with pressure  All other systems were reviewed with the patient and are negative.  MEDICAL HISTORY:  Past Medical History:  Diagnosis Date  . Anxiety   . Arthritis   . Breast cancer ([place])   . CAD (coronary artery disease)    a. 03/2017: 95% LCx stenosis --> PCI/DES placed  . Cataract   . Chest pain 03/27/2017  . Chronic diastolic heart failure ([place]) 06/25/2017  . Chronic lower back pain   . Colon cancer ([place]) 11/2019   s/p colectomy  . COPD (chronic obstructive pulmonary disease) ([place])   . Coronary artery disease   . Diverticulitis   . Diverticulosis   . GERD (gastroesophageal reflux disease)   . Glaucoma   . Heart failure, type  unknown ([place]) 03/11/2017  . History of radiation therapy 08/05/19- 09/03/19   Right Breast/ SCV 16 fractions of 2.66 Gy each for a total of 42.56 Gy. Right breast boost 4 fractions of 2 Gy each to total 8 Gy.   Marland Kitchen Hyperlipidemia 03/11/2017  . Hypertension   . Hypothyroidism   . Macular degeneration    wet and dry per pt's son  . Personal history of chemotherapy   . Personal history of radiation therapy   . Pneumonia X 1   "years and years ago" (03/26/2017)  . Shortness of breath 03/11/2017  . Status post dilation of esophageal narrowing     SURGICAL HISTORY: Past Surgical History:  Procedure Laterality Date  . BILATERAL OOPHORECTOMY Bilateral   . BREAST LUMPECTOMY Right 06/19/2019  . BREAST LUMPECTOMY WITH RADIOACTIVE SEED AND SENTINEL LYMPH NODE BIOPSY Right 06/19/2019   Procedure: RIGHT BREAST LUMPECTOMY WITH RADIOACTIVE SEED AND SENTINEL LYMPH NODE BIOPSY;  Surgeon: Jovita Kussmaul, MD;  Location: [facility];  Service: General;  Laterality: Right;  . CATARACT EXTRACTION    . CATARACT EXTRACTION W/ INTRAOCULAR LENS  IMPLANT, BILATERAL Bilateral   . COLOSTOMY     Greater than 10 yrs  . CORONARY ANGIOPLASTY WITH STENT PLACEMENT  03/26/2017  . LAPAROSCOPIC RIGHT COLECTOMY Right 12/15/2019   Procedure: LAPAROSCOPIC ASSISTED RIGHT COLECTOMY;  Surgeon: Jovita Kussmaul, MD;  Location: [facility];  Service: General;  Laterality: Right;  . LEFT HEART CATH AND CORONARY ANGIOGRAPHY N/A 03/26/2017   Procedure: Left Heart Cath and Coronary Angiography;  Surgeon: Belva Crome, MD;  Location: [facility];  Service: Cardiovascular;  Laterality: N/A;  . NERVE SURGERY     "lump on my sciatic nerve was removed years ago"  . SHOULDER OPEN ROTATOR CUFF REPAIR Right   . THYROIDECTOMY, PARTIAL    . TONSILLECTOMY    . UPPER GASTROINTESTINAL ENDOSCOPY     > ten yrs    I have reviewed the social history and family history with the patient and they are unchanged from previous note.  ALLERGIES:  is allergic to  lipitor [atorvastatin].  MEDICATIONS:  Current Outpatient Medications  Medication Sig Dispense Refill  . albuterol (PROVENTIL HFA;VENTOLIN HFA) 108 (90 Base) MCG/ACT inhaler Inhale 2 puffs into the lungs every 6 (six) hours as needed (for shortness of breath/wheezing.).     Marland Kitchen aspirin EC 81 MG tablet Take 81 mg by mouth daily.    . brimonidine (ALPHAGAN) 0.2 % ophthalmic solution Place 1 drop into the left eye 2 (two) times daily.     . budesonide-formoterol (SYMBICORT) 160-4.5 MCG/ACT inhaler Inhale 2 puffs into the lungs 2 (two) times daily.    . Calcium Carb-Cholecalciferol (CALCIUM + D3) 600-200 MG-UNIT TABS Take 1  tablet by mouth daily.    . Cholecalciferol (VITAMIN D3) 2000 units capsule Take 4,000 Units by mouth daily.    . Coenzyme Q10 (COQ10) 100 MG CAPS Take 100 mg by mouth daily.    Marland Kitchen latanoprost (XALATAN) 0.005 % ophthalmic solution Place 1 drop into both eyes at bedtime.    Marland Kitchen levothyroxine (SYNTHROID, LEVOTHROID) 100 MCG tablet Take 100 mcg by mouth daily before breakfast.   0  . LORazepam (ATIVAN) 0.5 MG tablet Take 0.5 mg by mouth 2 (two) times daily.     . LUTEIN PO Take 1 tablet by mouth daily.    . mirtazapine (REMERON) 15 MG tablet Take 15 mg by mouth at bedtime.    . Multiple Vitamin (MULTIVITAMIN) tablet Take 1 tablet by mouth daily.    . nitroGLYCERIN (NITROSTAT) 0.4 MG SL tablet Place 1 tablet (0.4 mg total) under the tongue every 5 (five) minutes as needed for chest pain. 25 tablet 3  . Omega-3 Fatty Acids (FISH OIL PO) Take 1 capsule by mouth daily.    . pravastatin (PRAVACHOL) 40 MG tablet TAKE 1 TABLET BY MOUTH DAILY GENERIC EQUIVALENT FOR PRAVACHOL 90 tablet 1  . tamoxifen (NOLVADEX) 20 MG tablet Take 1 tablet (20 mg total) by mouth daily. 90 tablet 3  . TOVIAZ 4 MG TB24 tablet Take 4 mg by mouth daily.   1  . traMADol (ULTRAM) 50 MG tablet Take 1 tablet (50 mg total) by mouth every 6 (six) hours as needed. 30 tablet 0   Current Facility-Administered Medications   Medication Dose Route Frequency Provider Last Rate Last Admin  . aflibercept (EYLEA) SOLN 2 mg  2 mg Intravitreal  Bernarda Caffey, MD   2 mg at 04/16/18 1424  . aflibercept (EYLEA) SOLN 2 mg  2 mg Intravitreal  Bernarda Caffey, MD   2 mg at 05/14/18 1358  . aflibercept (EYLEA) SOLN 2 mg  2 mg Intravitreal  Bernarda Caffey, MD   2 mg at 06/25/18 1433  . aflibercept (EYLEA) SOLN 2 mg  2 mg Intravitreal  Bernarda Caffey, MD   2 mg at 08/07/18 1557  . aflibercept (EYLEA) SOLN 2 mg  2 mg Intravitreal  Bernarda Caffey, MD   2 mg at 10/11/18 1522  . Bevacizumab (AVASTIN) SOLN 1.25 mg  1.25 mg Intravitreal  Bernarda Caffey, MD   1.25 mg at 01/07/18 1429  . Bevacizumab (AVASTIN) SOLN 1.25 mg  1.25 mg Intravitreal  Bernarda Caffey, MD   1.25 mg at 02/19/18 1356  . Bevacizumab (AVASTIN) SOLN 1.25 mg  1.25 mg Intravitreal  Bernarda Caffey, MD   1.25 mg at 03/19/18 1347    PHYSICAL EXAMINATION: ECOG PERFORMANCE STATUS: 1 - Symptomatic but completely ambulatory  Vitals:   08/06/20 1329  BP: (!) 146/78  Pulse: 81  Resp: 20  Temp: 98.1 F (36.7 C)  SpO2: 100%   Filed Weights   08/06/20 1329  Weight: 140 lb 9.6 oz (63.8 kg)    GENERAL:alert, no distress and comfortable SKIN: skin color, texture, turgor are normal, no rashes or significant lesions EYES: normal, Conjunctiva are pink and non-injected, sclera clear  NECK: supple, thyroid normal size, non-tender, without nodularity LYMPH:  no palpable lymphadenopathy in the cervical, axillary  LUNGS: clear to auscultation and percussion with normal breathing effort HEART: regular rate & rhythm and no murmurs and no lower extremity edema ABDOMEN:abdomen soft, non-tender and normal bowel sounds Musculoskeletal:no cyanosis of digits and no clubbing  NEURO: alert & oriented x 3 with  fluent speech, no focal motor/sensory deficits BREAST: S/p right lumpectomy: surgical incision healed well with lateral scar tissue and adjacent swelling. Left Breast exam benign.    LABORATORY DATA:  I have reviewed the data as listed CBC Latest Ref Rng & Units 08/06/2020 04/09/2020 01/08/2020  WBC 4.0 - 10.5 K/uL 5.0 5.1 5.8  Hemoglobin 12.0 - 15.0 g/dL 11.4(L) 12.0 12.4  Hematocrit 36.0 - 46.0 % 33.8(L) 36.6 37.8  Platelets 150 - 400 K/uL 171 213 354     CMP Latest Ref Rng & Units 08/06/2020 04/09/2020 01/08/2020  Glucose 70 - 99 mg/dL 86 98 99  BUN 8 - 23 mg/dL 29(H) 25(H) 20  Creatinine 0.44 - 1.00 mg/dL 0.74 0.78 0.74  Sodium 135 - 145 mmol/L 138 140 138  Potassium 3.5 - 5.1 mmol/L 4.2 4.5 3.4(L)  Chloride 98 - 111 mmol/L 106 106 99  CO2 22 - 32 mmol/L 27 27 29   Calcium 8.9 - 10.3 mg/dL 9.1 9.7 9.6  Total Protein 6.5 - 8.1 g/dL 6.8 6.9 7.4  Total Bilirubin 0.3 - 1.2 mg/dL 0.4 0.3 0.3  Alkaline Phos 38 - 126 U/L 43 51 58  AST 15 - 41 U/L 22 21 16   ALT 0 - 44 U/L 16 12 11       RADIOGRAPHIC STUDIES: I have personally reviewed the radiological images as listed and agreed with the findings in the report. No results found.   ASSESSMENT & PLAN:  Marissa Caldwell is a 86 y.o. female with    1.Cancer of right colon, pT2N0M0 -She was diagnosed in 09/2019 by colonoscopy with Dr Silverio Decamp.  -She underwent right colectomy on 12/15/19 with Dr. Marlou Starks. 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 Negative margins and LNs.  -From a colon cancer standpoint, she is clinically stable. She continues Metamucil for regular BM. She will occasionally have RLQ pain, I suspect related to her bowel.  -Labs reviewed, CBC and CMP WNL except Hg 11.4, BUN 29, Albumin 3.3. Physical exam benign. No clinical suspicion for recurrence.  -Continue surveillance. Next Colonoscopy with Dr Silverio Decamp in 11/2020. -f/u in 6 months with next CT CAP     2.Malignant neoplasm of upper-outer quadrant of right breast,invasive ductal carcinoma,pT1cN2aMx, ER/PR+, HER2-,Grade II -She wasdiagnosedin 03/2019 with  invasiveductalcarcinoma which spreadto herlocalLN.  -She underwent right breast lumpectomy withtargeted lymph node dissectionbyDr Marlou Starks on 06/19/19, adjuvant RT with Dr. Isidore Moos 08/05/19-09/03/19. -She started Tamoxifen in 03/2019 and held 05/2019-07/2019 for surgery.  -Her PET from 07/2019 shows uptake of right chest wall likely related to surgery and Right subpectoral LN concerning which is indeterminate. However her Mammogram from 03/22/20 was benign. Will monitor.  -From a breast cancer standpoint, she is stable. Physical exam shows scar tissue with mild swelling, overall benign. She still has right side pain with pressure, secondary to surgery.  -Continue Tamoxifen and surveillance with yearly mammograms.   3. CAD, HTN, Thyroid dysfunction -She has had a cardiac stent placed in the past. She will continue to follow up with her cardiologist. -She is on Coreg, nitro, pravastatin and other meds for her heartand thyroid medication  4. COPD, Long Term smoking, Ground-glass inflammation -On inhalers, not on home oxygen -She still smokes occasionally. She has not completely quit.  -PET from 12/1/120 shows 1m ground-glass nodules in RU lung. Persists on 04/30/20 CT Chest. Will f/u on CT CAP in 01/2021.   5. Underweight, Fatigue  -After drastic change in diet she had lowered appetite and lost significant  weight in a year. She has started to gain some of that weight back.  -She is no longer on Mirtazapine. She still eats fairly. Her weight is stable.  -Albumin is still low. I again encouraged her to have high calorie, high protein diet.   6. Osteoporosis, Arthritis  -She has received Proliainjectionsq7month with Dr. WDema Severin -She will continue Calcium and Vit D.  -Her joint pain is mainly in her left hand and lower back when she stands for a long time.Stable and manageable.   7. Macular Degeneration of both eyes, hearingloss -Managed by her  Ophthalmologist  8.Anxiety/depression  -OnZoloft, mood stable -We discussed thatZoloft has very mild interaction with Tamoxifen, will monitor for now  PLAN: -Continue Tamoxifen  -Colonoscopy in 11/2020, will copy GI Dr. NSilverio Decamp -F/u in 6 months with Lab and CT CAP w  Contrast a few days before.    No problem-specific Assessment & Plan notes found for this encounter.   Orders Placed This Encounter  Procedures  . CT CHEST ABDOMEN PELVIS W CONTRAST    Standing Status:   Future    Standing Expiration Date:   08/06/2021    Order Specific Question:   If indicated for the ordered procedure, I authorize the administration of contrast media per Radiology protocol    Answer:   Yes    Order Specific Question:   [facility]    [facility]   Order Specific Question:   Release to patient    Answer:   Immediate    Order Specific Question:   Is Oral Contrast requested for this exam?    Answer:   Yes, Per Radiology protocol    Order Specific Question:   Reason for Exam (SYMPTOM  OR DIAGNOSIS REQUIRED)    Answer:   rule out recurrence   All questions were answered. The patient knows to call the clinic with any problems, questions or concerns. No barriers to learning was detected. The total time spent in the appointment was 30 minutes.     YTruitt Merle MD 08/06/2020   I, AJoslyn Devon am acting as scribe for YTruitt Merle MD.   I have reviewed the above documentation for accuracy and completeness, and I agree with the above.

## 2020-08-05 ENCOUNTER — Other Ambulatory Visit: Payer: Self-pay

## 2020-08-05 DIAGNOSIS — C182 Malignant neoplasm of ascending colon: Secondary | ICD-10-CM

## 2020-08-06 ENCOUNTER — Inpatient Hospital Stay: Payer: Medicare Other

## 2020-08-06 ENCOUNTER — Other Ambulatory Visit: Payer: Self-pay

## 2020-08-06 ENCOUNTER — Telehealth: Payer: Self-pay | Admitting: Hematology

## 2020-08-06 ENCOUNTER — Encounter: Payer: Self-pay | Admitting: Hematology

## 2020-08-06 ENCOUNTER — Inpatient Hospital Stay: Payer: Medicare Other | Attending: Hematology | Admitting: Hematology

## 2020-08-06 VITALS — BP 146/78 | HR 81 | Temp 98.1°F | Resp 20 | Ht 70.0 in | Wt 140.6 lb

## 2020-08-06 DIAGNOSIS — R636 Underweight: Secondary | ICD-10-CM | POA: Diagnosis not present

## 2020-08-06 DIAGNOSIS — M549 Dorsalgia, unspecified: Secondary | ICD-10-CM | POA: Diagnosis not present

## 2020-08-06 DIAGNOSIS — H9190 Unspecified hearing loss, unspecified ear: Secondary | ICD-10-CM | POA: Diagnosis not present

## 2020-08-06 DIAGNOSIS — R5383 Other fatigue: Secondary | ICD-10-CM | POA: Insufficient documentation

## 2020-08-06 DIAGNOSIS — R1031 Right lower quadrant pain: Secondary | ICD-10-CM | POA: Diagnosis not present

## 2020-08-06 DIAGNOSIS — H3530 Unspecified macular degeneration: Secondary | ICD-10-CM | POA: Diagnosis not present

## 2020-08-06 DIAGNOSIS — I2510 Atherosclerotic heart disease of native coronary artery without angina pectoris: Secondary | ICD-10-CM | POA: Diagnosis not present

## 2020-08-06 DIAGNOSIS — Z79810 Long term (current) use of selective estrogen receptor modulators (SERMs): Secondary | ICD-10-CM | POA: Insufficient documentation

## 2020-08-06 DIAGNOSIS — C50411 Malignant neoplasm of upper-outer quadrant of right female breast: Secondary | ICD-10-CM | POA: Diagnosis not present

## 2020-08-06 DIAGNOSIS — E079 Disorder of thyroid, unspecified: Secondary | ICD-10-CM | POA: Insufficient documentation

## 2020-08-06 DIAGNOSIS — M25542 Pain in joints of left hand: Secondary | ICD-10-CM | POA: Diagnosis not present

## 2020-08-06 DIAGNOSIS — C182 Malignant neoplasm of ascending colon: Secondary | ICD-10-CM | POA: Diagnosis not present

## 2020-08-06 DIAGNOSIS — Z170 Estrogen receptor positive status [ER+]: Secondary | ICD-10-CM

## 2020-08-06 DIAGNOSIS — M810 Age-related osteoporosis without current pathological fracture: Secondary | ICD-10-CM | POA: Insufficient documentation

## 2020-08-06 DIAGNOSIS — Z85038 Personal history of other malignant neoplasm of large intestine: Secondary | ICD-10-CM | POA: Diagnosis not present

## 2020-08-06 DIAGNOSIS — F419 Anxiety disorder, unspecified: Secondary | ICD-10-CM | POA: Diagnosis not present

## 2020-08-06 DIAGNOSIS — J449 Chronic obstructive pulmonary disease, unspecified: Secondary | ICD-10-CM | POA: Diagnosis not present

## 2020-08-06 DIAGNOSIS — Z720 Tobacco use: Secondary | ICD-10-CM | POA: Diagnosis not present

## 2020-08-06 DIAGNOSIS — F32A Depression, unspecified: Secondary | ICD-10-CM | POA: Insufficient documentation

## 2020-08-06 DIAGNOSIS — M1990 Unspecified osteoarthritis, unspecified site: Secondary | ICD-10-CM | POA: Diagnosis not present

## 2020-08-06 DIAGNOSIS — Z79899 Other long term (current) drug therapy: Secondary | ICD-10-CM | POA: Diagnosis not present

## 2020-08-06 LAB — CBC WITH DIFFERENTIAL (CANCER CENTER ONLY)
Abs Immature Granulocytes: 0.01 10*3/uL (ref 0.00–0.07)
Basophils Absolute: 0 10*3/uL (ref 0.0–0.1)
Basophils Relative: 1 %
Eosinophils Absolute: 0.1 10*3/uL (ref 0.0–0.5)
Eosinophils Relative: 2 %
HCT: 33.8 % — ABNORMAL LOW (ref 36.0–46.0)
Hemoglobin: 11.4 g/dL — ABNORMAL LOW (ref 12.0–15.0)
Immature Granulocytes: 0 %
Lymphocytes Relative: 23 %
Lymphs Abs: 1.1 10*3/uL (ref 0.7–4.0)
MCH: 31.8 pg (ref 26.0–34.0)
MCHC: 33.7 g/dL (ref 30.0–36.0)
MCV: 94.4 fL (ref 80.0–100.0)
Monocytes Absolute: 0.7 10*3/uL (ref 0.1–1.0)
Monocytes Relative: 13 %
Neutro Abs: 3 10*3/uL (ref 1.7–7.7)
Neutrophils Relative %: 61 %
Platelet Count: 171 10*3/uL (ref 150–400)
RBC: 3.58 MIL/uL — ABNORMAL LOW (ref 3.87–5.11)
RDW: 14.1 % (ref 11.5–15.5)
WBC Count: 5 10*3/uL (ref 4.0–10.5)
nRBC: 0 % (ref 0.0–0.2)

## 2020-08-06 LAB — CMP (CANCER CENTER ONLY)
ALT: 16 U/L (ref 0–44)
AST: 22 U/L (ref 15–41)
Albumin: 3.3 g/dL — ABNORMAL LOW (ref 3.5–5.0)
Alkaline Phosphatase: 43 U/L (ref 38–126)
Anion gap: 5 (ref 5–15)
BUN: 29 mg/dL — ABNORMAL HIGH (ref 8–23)
CO2: 27 mmol/L (ref 22–32)
Calcium: 9.1 mg/dL (ref 8.9–10.3)
Chloride: 106 mmol/L (ref 98–111)
Creatinine: 0.74 mg/dL (ref 0.44–1.00)
GFR, Estimated: >60 mL/min (ref >60)
Glucose, Bld: 86 mg/dL (ref 70–99)
Potassium: 4.2 mmol/L (ref 3.5–5.1)
Sodium: 138 mmol/L (ref 135–145)
Total Bilirubin: 0.4 mg/dL (ref 0.3–1.2)
Total Protein: 6.8 g/dL (ref 6.5–8.1)

## 2020-08-06 NOTE — Telephone Encounter (Signed)
Scheduled appointments per 12/17 los. Spoke to patient who is aware of appointments dates and times.

## 2020-08-31 ENCOUNTER — Emergency Department (HOSPITAL_COMMUNITY)
Admission: EM | Admit: 2020-08-31 | Discharge: 2020-09-01 | Disposition: A | Discharge status: Home / Self Care | Payer: Medicare Other | Attending: Emergency Medicine | Admitting: Emergency Medicine

## 2020-08-31 ENCOUNTER — Emergency Department (HOSPITAL_COMMUNITY): Payer: Medicare Other

## 2020-08-31 ENCOUNTER — Other Ambulatory Visit: Payer: Self-pay

## 2020-08-31 DIAGNOSIS — F17210 Nicotine dependence, cigarettes, uncomplicated: Secondary | ICD-10-CM | POA: Insufficient documentation

## 2020-08-31 DIAGNOSIS — Z79899 Other long term (current) drug therapy: Secondary | ICD-10-CM | POA: Insufficient documentation

## 2020-08-31 DIAGNOSIS — I5032 Chronic diastolic (congestive) heart failure: Secondary | ICD-10-CM | POA: Diagnosis not present

## 2020-08-31 DIAGNOSIS — Z853 Personal history of malignant neoplasm of breast: Secondary | ICD-10-CM | POA: Diagnosis not present

## 2020-08-31 DIAGNOSIS — I110 Hypertensive heart disease with heart failure: Secondary | ICD-10-CM | POA: Diagnosis not present

## 2020-08-31 DIAGNOSIS — R1031 Right lower quadrant pain: Secondary | ICD-10-CM | POA: Diagnosis not present

## 2020-08-31 DIAGNOSIS — I2510 Atherosclerotic heart disease of native coronary artery without angina pectoris: Secondary | ICD-10-CM | POA: Insufficient documentation

## 2020-08-31 DIAGNOSIS — K5900 Constipation, unspecified: Secondary | ICD-10-CM | POA: Insufficient documentation

## 2020-08-31 DIAGNOSIS — Z85038 Personal history of other malignant neoplasm of large intestine: Secondary | ICD-10-CM | POA: Insufficient documentation

## 2020-08-31 DIAGNOSIS — Z7982 Long term (current) use of aspirin: Secondary | ICD-10-CM | POA: Insufficient documentation

## 2020-08-31 DIAGNOSIS — R0902 Hypoxemia: Secondary | ICD-10-CM | POA: Diagnosis not present

## 2020-08-31 DIAGNOSIS — Z7951 Long term (current) use of inhaled steroids: Secondary | ICD-10-CM | POA: Insufficient documentation

## 2020-08-31 DIAGNOSIS — N390 Urinary tract infection, site not specified: Secondary | ICD-10-CM | POA: Insufficient documentation

## 2020-08-31 DIAGNOSIS — R1084 Generalized abdominal pain: Secondary | ICD-10-CM | POA: Diagnosis not present

## 2020-08-31 DIAGNOSIS — E039 Hypothyroidism, unspecified: Secondary | ICD-10-CM | POA: Insufficient documentation

## 2020-08-31 DIAGNOSIS — Z955 Presence of coronary angioplasty implant and graft: Secondary | ICD-10-CM | POA: Diagnosis not present

## 2020-08-31 DIAGNOSIS — R109 Unspecified abdominal pain: Secondary | ICD-10-CM | POA: Diagnosis not present

## 2020-08-31 LAB — CBC
HCT: 37.3 % (ref 36.0–46.0)
Hemoglobin: 12.6 g/dL (ref 12.0–15.0)
MCH: 32.6 pg (ref 26.0–34.0)
MCHC: 33.8 g/dL (ref 30.0–36.0)
MCV: 96.6 fL (ref 80.0–100.0)
Platelets: 199 10*3/uL (ref 150–400)
RBC: 3.86 MIL/uL — ABNORMAL LOW (ref 3.87–5.11)
RDW: 13.8 % (ref 11.5–15.5)
WBC: 8.9 10*3/uL (ref 4.0–10.5)
nRBC: 0 % (ref 0.0–0.2)

## 2020-08-31 LAB — COMPREHENSIVE METABOLIC PANEL
ALT: 14 U/L (ref 0–44)
AST: 23 U/L (ref 15–41)
Albumin: 3.1 g/dL — ABNORMAL LOW (ref 3.5–5.0)
Alkaline Phosphatase: 43 U/L (ref 38–126)
Anion gap: 11 (ref 5–15)
BUN: 17 mg/dL (ref 8–23)
CO2: 23 mmol/L (ref 22–32)
Calcium: 9.1 mg/dL (ref 8.9–10.3)
Chloride: 101 mmol/L (ref 98–111)
Creatinine, Ser: 0.75 mg/dL (ref 0.44–1.00)
GFR, Estimated: >60 mL/min (ref >60)
Glucose, Bld: 119 mg/dL — ABNORMAL HIGH (ref 70–99)
Potassium: 3.8 mmol/L (ref 3.5–5.1)
Sodium: 135 mmol/L (ref 135–145)
Total Bilirubin: 0.5 mg/dL (ref 0.3–1.2)
Total Protein: 6.7 g/dL (ref 6.5–8.1)

## 2020-08-31 LAB — URINALYSIS, ROUTINE W REFLEX MICROSCOPIC
Bilirubin Urine: NEGATIVE
Glucose, UA: NEGATIVE mg/dL
Hgb urine dipstick: NEGATIVE
Ketones, ur: 5 mg/dL — AB
Leukocytes,Ua: NEGATIVE
Nitrite: POSITIVE — AB
Protein, ur: NEGATIVE mg/dL
Specific Gravity, Urine: 1.043 — ABNORMAL HIGH (ref 1.005–1.030)
pH: 5 (ref 5.0–8.0)

## 2020-08-31 LAB — LIPASE, BLOOD: Lipase: 20 U/L (ref 11–51)

## 2020-08-31 MED ORDER — SODIUM CHLORIDE 0.9 % IV BOLUS
500.0000 mL | Freq: Once | INTRAVENOUS | Status: AC
  Administered 2020-08-31: 500 mL via INTRAVENOUS

## 2020-08-31 MED ORDER — CEPHALEXIN 500 MG PO CAPS: ORAL_CAPSULE | ORAL | 0 refills | Status: DC

## 2020-08-31 MED ORDER — ACETAMINOPHEN 500 MG PO TABS
1000.0000 mg | ORAL_TABLET | Freq: Once | ORAL | Status: AC
  Administered 2020-08-31: 1000 mg via ORAL
  Filled 2020-08-31: qty 2

## 2020-08-31 MED ORDER — TRAMADOL HCL 50 MG PO TABS: 50.0000 mg | ORAL_TABLET | Freq: Two times a day (BID) | ORAL | Status: DC | PRN

## 2020-08-31 MED ORDER — TRAMADOL HCL 50 MG PO TABS: 50.0000 mg | ORAL_TABLET | Freq: Two times a day (BID) | ORAL | 0 refills | Status: DC | PRN

## 2020-08-31 MED ORDER — ACETAMINOPHEN 500 MG PO TABS: 1000.0000 mg | ORAL_TABLET | Freq: Four times a day (QID) | ORAL | Status: DC | PRN

## 2020-08-31 MED ORDER — ONDANSETRON HCL 4 MG/2ML IJ SOLN
4.0000 mg | Freq: Once | INTRAMUSCULAR | Status: AC
  Administered 2020-08-31: 4 mg via INTRAVENOUS
  Filled 2020-08-31: qty 2

## 2020-08-31 MED ORDER — SODIUM CHLORIDE 0.9 % IV SOLN
1.0000 g | Freq: Once | INTRAVENOUS | Status: AC
  Administered 2020-08-31: 1 g via INTRAVENOUS
  Filled 2020-08-31: qty 10

## 2020-08-31 MED ORDER — ONDANSETRON 4 MG PO TBDP: 4.0000 mg | ORAL_TABLET | Freq: Once | ORAL | Status: DC | PRN

## 2020-08-31 MED ORDER — FLEET ENEMA 7-19 GM/118ML RE ENEM
1.0000 | ENEMA | Freq: Once | RECTAL | Status: AC
  Administered 2020-08-31: 1 via RECTAL
  Filled 2020-08-31: qty 1

## 2020-08-31 MED ORDER — CEPHALEXIN 250 MG PO CAPS
500.0000 mg | ORAL_CAPSULE | Freq: Four times a day (QID) | ORAL | Status: AC
  Administered 2020-09-01 (×2): 500 mg via ORAL
  Filled 2020-08-31 (×2): qty 2

## 2020-08-31 MED ORDER — IOHEXOL 300 MG/ML  SOLN
100.0000 mL | Freq: Once | INTRAMUSCULAR | Status: AC | PRN
  Administered 2020-08-31: 100 mL via INTRAVENOUS

## 2020-08-31 MED ORDER — MORPHINE SULFATE (PF) 4 MG/ML IV SOLN
4.0000 mg | Freq: Once | INTRAVENOUS | Status: AC
  Administered 2020-08-31: 4 mg via INTRAVENOUS
  Filled 2020-08-31: qty 1

## 2020-08-31 NOTE — ED Provider Notes (Signed)
[facility] EMERGENCY DEPARTMENT Provider Note   CSN: [id number] Arrival date & time: 08/31/20  0756     History Chief Complaint  Patient presents with  . Abdominal Pain    Marissa Caldwell is a 87 y.o. female.  Marissa Caldwell is a 87 y.o. female with history of hypertension, hyperlipidemia, GERD, COPD, CAD, CHF, colon cancer s/p partial colectomy, who presents to the emergency department for evaluation of right lower quadrant abdominal pain.  Patient reports pain started fairly suddenly yesterday.  She reports she had been constipated but then was able to have 4 bowel movements yesterday but with this she had worsening pain.  She reports associated nausea but no vomiting.  No fevers or chills.  She reports pain is a constant achy and sharp pain that is worse with any movements or palpation.  She states she has not had a bowel movement today.  Denies any blood in the stool or melena.  No associated dysuria or urinary frequency.  Denies any flank pain.  No associated chest pain or shortness of breath.  She has not taken anything to treat the symptoms prior to arrival.  Reports in April she had surgery for colon cancer where they removed a portion of her colon in the right lower quadrant.  Denies history of other abdominal surgeries.  No other aggravating or alleviating factors.        Past Medical History:  Diagnosis Date  . Anxiety   . Arthritis   . Breast cancer ([place])   . CAD (coronary artery disease)    a. 03/2017: 95% LCx stenosis --> PCI/DES placed  . Cataract   . Chest pain 03/27/2017  . Chronic diastolic heart failure ([place]) 06/25/2017  . Chronic lower back pain   . Colon cancer ([place]) 11/2019   s/p colectomy  . COPD (chronic obstructive pulmonary disease) ([place])   . Coronary artery disease   . Diverticulitis   . Diverticulosis   . GERD (gastroesophageal reflux disease)   . Glaucoma   . Heart failure, type unknown ([place]) 03/11/2017  . History of  radiation therapy 08/05/19- 09/03/19   Right Breast/ SCV 16 fractions of 2.66 Gy each for a total of 42.56 Gy. Right breast boost 4 fractions of 2 Gy each to total 8 Gy.   Marland Kitchen Hyperlipidemia 03/11/2017  . Hypertension   . Hypothyroidism   . Macular degeneration    wet and dry per pt's son  . Personal history of chemotherapy   . Personal history of radiation therapy   . Pneumonia X 1   "years and years ago" (03/26/2017)  . Shortness of breath 03/11/2017  . Status post dilation of esophageal narrowing     Patient Active Problem List   Diagnosis Date Noted  . Malnutrition of moderate degree 12/22/2019  . HOH (hard of hearing) 12/21/2019  . Cancer of right colon ([place]) 12/15/2019  . Malignant neoplasm of upper-outer quadrant of right breast in female, estrogen receptor positive ([place]) 04/03/2019  . Chronic diastolic heart failure ([place]) 06/25/2017  . COPD (chronic obstructive pulmonary disease) ([place]) 04/13/2017  . Chest pain 03/27/2017  . CAD S/P percutaneous coronary angioplasty 03/26/2017  . Abnormal nuclear stress test 03/25/2017  . Dyslipidemia 03/11/2017  . Hypothyroidism 03/11/2017  . Shortness of breath 03/11/2017    Past Surgical History:  Procedure Laterality Date  . BILATERAL OOPHORECTOMY Bilateral   . BREAST LUMPECTOMY Right 06/19/2019  . BREAST LUMPECTOMY WITH RADIOACTIVE SEED AND SENTINEL LYMPH NODE BIOPSY  Right 06/19/2019   Procedure: RIGHT BREAST LUMPECTOMY WITH RADIOACTIVE SEED AND SENTINEL LYMPH NODE BIOPSY;  Surgeon: Jovita Kussmaul, MD;  Location: [facility];  Service: General;  Laterality: Right;  . CATARACT EXTRACTION    . CATARACT EXTRACTION W/ INTRAOCULAR LENS  IMPLANT, BILATERAL Bilateral   . COLOSTOMY     Greater than 10 yrs  . CORONARY ANGIOPLASTY WITH STENT PLACEMENT  03/26/2017  . LAPAROSCOPIC RIGHT COLECTOMY Right 12/15/2019   Procedure: LAPAROSCOPIC ASSISTED RIGHT COLECTOMY;  Surgeon: Jovita Kussmaul, MD;  Location: [facility];  Service: General;  Laterality: Right;  .  LEFT HEART CATH AND CORONARY ANGIOGRAPHY N/A 03/26/2017   Procedure: Left Heart Cath and Coronary Angiography;  Surgeon: Belva Crome, MD;  Location: [facility];  Service: Cardiovascular;  Laterality: N/A;  . NERVE SURGERY     "lump on my sciatic nerve was removed years ago"  . SHOULDER OPEN ROTATOR CUFF REPAIR Right   . THYROIDECTOMY, PARTIAL    . TONSILLECTOMY    . UPPER GASTROINTESTINAL ENDOSCOPY     > ten yrs     OB History   No obstetric history on file.     Family History  Problem Relation Age of Onset  . CAD Mother   . Cataracts Mother   . Coronary artery disease Father   . Leukemia Sister   . Amblyopia Neg Hx   . Blindness Neg Hx   . Diabetes Neg Hx   . Glaucoma Neg Hx   . Macular degeneration Neg Hx   . Retinal detachment Neg Hx   . Strabismus Neg Hx   . Retinitis pigmentosa Neg Hx   . Colon cancer Neg Hx   . Colon polyps Neg Hx   . Esophageal cancer Neg Hx   . Rectal cancer Neg Hx   . Stomach cancer Neg Hx     Social History   Tobacco Use  . Smoking status: Current Some Day Smoker    Packs/day: 0.50    Years: 60.00    Pack years: 30.00    Types: Cigarettes  . Smokeless tobacco: Never Used  Vaping Use  . Vaping Use: Never used  Substance Use Topics  . Alcohol use: Yes    Comment: twice a month  . Drug use: No    Home Medications Prior to Admission medications   Medication Sig Start Date End Date Taking? Authorizing Provider  albuterol (PROVENTIL HFA;VENTOLIN HFA) 108 (90 Base) MCG/ACT inhaler Inhale 2 puffs into the lungs every 6 (six) hours as needed (for shortness of breath/wheezing.).     [provider]  aspirin EC 81 MG tablet Take 81 mg by mouth daily.    [provider]  brimonidine (ALPHAGAN) 0.2 % ophthalmic solution Place 1 drop into the left eye 2 (two) times daily.  05/06/19   [provider]  budesonide-formoterol (SYMBICORT) 160-4.5 MCG/ACT inhaler Inhale 2 puffs into the lungs 2 (two) times daily.     [provider]  Calcium Carb-Cholecalciferol (CALCIUM + D3) 600-200 MG-UNIT TABS Take 1 tablet by mouth daily.    [provider]  Cholecalciferol (VITAMIN D3) 2000 units capsule Take 4,000 Units by mouth daily.    [provider]  Coenzyme Q10 (COQ10) 100 MG CAPS Take 100 mg by mouth daily.    [provider]  latanoprost (XALATAN) 0.005 % ophthalmic solution Place 1 drop into both eyes at bedtime.    [provider]  levothyroxine (SYNTHROID, LEVOTHROID) 100 MCG tablet  Take 100 mcg by mouth daily before breakfast.  05/17/18   [provider]  LORazepam (ATIVAN) 0.5 MG tablet Take 0.5 mg by mouth 2 (two) times daily.     [provider]  LUTEIN PO Take 1 tablet by mouth daily.    [provider]  mirtazapine (REMERON) 15 MG tablet Take 15 mg by mouth at bedtime.    [provider]  Multiple Vitamin (MULTIVITAMIN) tablet Take 1 tablet by mouth daily.    [provider]  nitroGLYCERIN (NITROSTAT) 0.4 MG SL tablet Place 1 tablet (0.4 mg total) under the tongue every 5 (five) minutes as needed for chest pain. 01/01/20   Erlene Quan, PA-C  Omega-3 Fatty Acids (FISH OIL PO) Take 1 capsule by mouth daily.    [provider]  pravastatin (PRAVACHOL) 40 MG tablet TAKE 1 TABLET BY MOUTH DAILY GENERIC EQUIVALENT FOR PRAVACHOL 03/18/20   Skeet Latch, MD  tamoxifen (NOLVADEX) 20 MG tablet Take 1 tablet (20 mg total) by mouth daily. 09/11/19   [name], MD  TOVIAZ 4 MG TB24 tablet Take 4 mg by mouth daily.  05/20/18   [provider]  traMADol (ULTRAM) 50 MG tablet Take 1 tablet (50 mg total) by mouth every 6 (six) hours as needed. 01/08/20   [name], MD    Allergies    Lipitor [atorvastatin]  Review of Systems   Review of Systems  Constitutional: Negative for chills and fever.  HENT: Negative.   Respiratory: Negative for cough and shortness of breath.   Cardiovascular: Negative for chest  pain.  Gastrointestinal: Positive for abdominal pain, constipation and nausea. Negative for diarrhea and vomiting.  Genitourinary: Negative for dysuria, flank pain, frequency, vaginal bleeding and vaginal discharge.  Musculoskeletal: Negative for arthralgias and myalgias.  Skin: Negative for color change and rash.  Neurological: Negative for dizziness, syncope and light-headedness.  All other systems reviewed and are negative.   Physical Exam Updated Vital Signs BP (!) 143/57 (BP Location: Left Arm)   Pulse 78   Temp 98.5 F (36.9 C) (Oral)   Resp 16   SpO2 97%   Physical Exam Vitals and nursing note reviewed.  Constitutional:      General: She is not in acute distress.    Appearance: She is well-developed and well-nourished. She is not diaphoretic.     Comments: Elderly female, alert, appears uncomfortable but is in no acute distress.  Very hard of hearing  HENT:     Head: Normocephalic and atraumatic.     Mouth/Throat:     Mouth: Oropharynx is clear and moist.  Eyes:     General:        Right eye: No discharge.        Left eye: No discharge.     Extraocular Movements: EOM normal.     Pupils: Pupils are equal, round, and reactive to light.  Cardiovascular:     Rate and Rhythm: Normal rate and regular rhythm.     Pulses: Intact distal pulses.     Heart sounds: Normal heart sounds. No murmur heard. No friction rub. No gallop.   Pulmonary:     Effort: Pulmonary effort is normal. No respiratory distress.     Breath sounds: Normal breath sounds. No wheezing or rales.     Comments: Respirations equal and unlabored, patient able to speak in full sentences, lungs clear to auscultation bilaterally  Abdominal:     General: Bowel sounds are normal. There is no distension.  Palpations: Abdomen is soft. There is no mass.     Tenderness: There is abdominal tenderness in the right lower quadrant. There is guarding.     Comments: Abdomen is soft and nondistended, bowel sounds are  present throughout, patient is focally tender in the right lower quadrant with some guarding, no masses or hernia noted, previous surgical incision noted.  No CVA tenderness.  Musculoskeletal:        General: No deformity or edema.     Cervical back: Neck supple.  Skin:    General: Skin is warm and dry.     Capillary Refill: Capillary refill takes less than 2 seconds.  Neurological:     Mental Status: She is alert.     Coordination: Coordination normal.     Comments: Speech is clear, able to follow commands Moves extremities without ataxia, coordination intact  Psychiatric:        Mood and Affect: Mood normal.        Behavior: Behavior normal.     ED Results / Procedures / Treatments   Labs (all labs ordered are listed, but only abnormal results are displayed) Labs Reviewed  COMPREHENSIVE METABOLIC PANEL - Abnormal; Notable for the following components:      Result Value   Glucose, Bld 119 (*)    Albumin 3.1 (*)    All other components within normal limits  CBC - Abnormal; Notable for the following components:   RBC 3.86 (*)    All other components within normal limits  URINALYSIS, ROUTINE W REFLEX MICROSCOPIC - Abnormal; Notable for the following components:   Specific Gravity, Urine 1.043 (*)    Ketones, ur 5 (*)    Nitrite POSITIVE (*)    Bacteria, UA MANY (*)    All other components within normal limits  URINE CULTURE  LIPASE, BLOOD    EKG None  Radiology CT ABDOMEN PELVIS W CONTRAST  Result Date: 08/31/2020 CLINICAL DATA:  RLQ abdominal pain, appendicitis suspected (Age >= 77y) 87 year old with right lower quadrant pain. Patient has history of laparoscopic right colectomy. EXAM: CT ABDOMEN AND PELVIS WITH CONTRAST TECHNIQUE: Multidetector CT imaging of the abdomen and pelvis was performed using the standard protocol following bolus administration of intravenous contrast. CONTRAST:  182mL OMNIPAQUE IOHEXOL 300 MG/ML  SOLN COMPARISON:  Most recent CT 01/28/2020  FINDINGS: Lower chest: Bandlike atelectasis in the right greater than left lung base. No pleural fluid. Coronary artery calcifications. Hepatobiliary: Clustered cysts in the central liver spans 3.4 cm, unchanged from prior exam. No new hepatic lesion. Gallbladder is displaced laterally. There is no calcified gallstone. No pericholecystic fat stranding. 7 mm common bile duct is within normal limits for patient's age. Pancreas: No ductal dilatation or inflammation. Spleen: Normal in size without focal abnormality. Adrenals/Urinary Tract: No adrenal nodule. Extrarenal pelvis configuration of the right kidney. Stable in appearance from prior exam. No frank hydronephrosis. Homogeneous renal enhancement. No focal renal abnormality. Urinary bladder is unremarkable. Stomach/Bowel: Lack of enteric contrast and paucity of intra-abdominal fat limits detailed bowel assessment. The stomach is nondistended. The duodenum does not definitively cross the midline. There is fecalization of small bowel contents. No small bowel obstruction or evident wall thickening. Patient is post right hemicolectomy (this includes appendectomy there is persistent irregular low-density wall thickening the lateral aspect of the proximal ascending colon, series 3, image 47. Mural enhancement with adjacent fat stranding. Overall findings are similar to most recent postoperative exam. Fat stranding and trace non organized fluid track into the right  pericolic gutter. There is transverse colonic tortuosity. Diverticulosis of the distal descending and sigmoid colon. No active diverticulitis. Mild gaseous distension of rectum. Vascular/Lymphatic: Advanced aortic and branch atherosclerosis. Patent portal vein. There are no enlarged lymph nodes in the abdomen or pelvis. Reproductive: Calcified uterine fibroids, similar to prior. There is no adnexal mass. Other: Fat stranding and inflammatory change in the right lower quadrant tracking into the pericolic gutter.  There is equivocal increase in inflammations in CT 7 months ago. No frank ascites. There is no free air. Musculoskeletal: Scoliosis and multilevel degenerative change in the spine. No focal bone lesion. IMPRESSION: 1. Post right hemicolectomy. There is again noted to be low-density wall thickening of the ascending colon adjacent to the suture line with fat stranding and inflammatory change in the right lower quadrant tracking into the right pericolic gutter. Overall findings are similar to postoperative CT 7 months ago. Considerations include persistent or recurrent infectious or inflammatory process, versus less likely recurrent colonic neoplasm. Direct visualization with colonoscopy may be of value given the persistence over many months. 2. Fecalization of small bowel contents, can be seen with slow transit/constipation. 3. Distal colonic diverticulosis without active diverticulitis. 4. Additional chronic findings are stable as described. Aortic Atherosclerosis (ICD10-I70.0). Electronically Signed   By: Keith Rake M.D.   On: 08/31/2020 17:08    Procedures Procedures (including critical care time)  Medications Ordered in ED Medications  acetaminophen (TYLENOL) tablet 1,000 mg (has no administration in time range)  traMADol (ULTRAM) tablet 50 mg (has no administration in time range)  cephALEXin (KEFLEX) capsule 500 mg (has no administration in time range)  sodium chloride 0.9 % bolus 500 mL (0 mLs Intravenous Stopped 08/31/20 1710)  morphine 4 MG/ML injection 4 mg (4 mg Intravenous Given 08/31/20 1559)  ondansetron (ZOFRAN) injection 4 mg (4 mg Intravenous Given 08/31/20 1556)  iohexol (OMNIPAQUE) 300 MG/ML solution 100 mL (100 mLs Intravenous Contrast Given 08/31/20 1654)  sodium phosphate (FLEET) 7-19 GM/118ML enema 1 enema (1 enema Rectal Given 08/31/20 1849)  sodium chloride 0.9 % bolus 500 mL (0 mLs Intravenous Stopped 08/31/20 2145)  sodium chloride 0.9 % bolus 500 mL (0 mLs Intravenous  Stopped 08/31/20 2145)  cefTRIAXone (ROCEPHIN) 1 g in sodium chloride 0.9 % 100 mL IVPB (0 g Intravenous Stopped 08/31/20 2243)  acetaminophen (TYLENOL) tablet 1,000 mg (1,000 mg Oral Given 08/31/20 2135)    ED Course  I have reviewed the triage vital signs and the nursing notes.  Pertinent labs & imaging results that were available during my care of the patient were reviewed by me and considered in my medical decision making (see chart for details).    MDM Rules/Calculators/A&P                         87 year old female presents with right lower quadrant abdominal pain present for the past 2 days associated with nausea and preceded by constipation.  Patient is afebrile, she is focally tender in the right lower quadrant with some guarding but is overall in no acute distress.  Will check abdominal labs and CT abdomen pelvis.  Patient does have history of colon cancer with partial colectomy with surgical site in the right lower quadrant.  Concern for potential appendicitis, diverticulitis, colitis, recurrent colon cancer, obstruction, constipation, or other acute intra-abdominal pathology.  Also concern for potential UTI, kidney stone or pyelonephritis.  I have independently ordered, reviewed and interpreted all labs and imaging: CBC: No leukocytosis, normal hemoglobin  CMP: Glucose 119 but no other significant electrolyte derangements, normal renal and liver function Lipase: WNL  CT with prior surgical changes from right hemicolectomy there is noted to be a low-density wall thickening in the ascending colon adjacent to the suture line with fat stranding and inflammatory changes in the right lower quadrant tracking into the right paracolic gutter, these findings are similar to postoperative CT 7 months ago, considerations include persistent or recurrent infectious or inflammatory process versus less likely recurrent colonic neoplasm.  Direct visualization with colonoscopy may be beneficial.  There is  also fecalization of the small bowel contents that can be seen with slow transit constipation.  No evidence of acute diverticulitis.  Patient's lab work is not indicative of acute infectious process she has no fever, tachycardia and does not meet SIRS criteria.  Suspect chronic inflammatory change and in the setting of constipation this may be causing increased pain.  Case discussed with Dr. Darl Householder who is seen and evaluated patient as well.  Recommends giving additional fluid bolus and enema.  Rectal exam without palpable fecal impaction.  After enema patient was able to pass some stool.  Patient's urinalysis also shows signs of UTI with positive nitrates and many bacteria, will culture and treat with antibiotics as this could also be contributing to patient's lower abdominal pain.  Dr. Darl Householder does not feel that patient needs admission or surgical evaluation especially given reassuring lab work.  We'll plan to increase patient's bowel regimen to include MiraLAX in addition to daily fiber supplement and will have her follow closely with her PCP and GI doctor.  We'll treat as an outpatient with Keflex for UTI.  Short course of tramadol for severe pain, Tylenol every 6 hours as needed for pain.  Discussed results and plan with patient, unfortunately she currently does not have the keys to her apartment, lives alone, and does not have anyone that can pick her up and take her home that has a way to get her into the building.  She has a neighbor that can let her in the morning but she has tried many times to get in contact with neighbor and she has been unable to reach them.  Case management consulted to assist with transportation needs, no master key available to get patient into apartment complex, she will have to board here in the ED overnight and they will arrange transport home in the morning.  As needed medications and antibiotics have been ordered for the patient while she reports in the ED overnight until she  can be discharged in the morning.  I have sent in prescriptions to patient's pharmacy and provided instructions.    Final Clinical Impression(s) / ED Diagnoses Final diagnoses:  Right lower quadrant abdominal pain  Constipation, unspecified constipation type  Acute UTI    Rx / DC Orders ED Discharge Orders         Ordered    cephALEXin (KEFLEX) 500 MG capsule        08/31/20 2310    traMADol (ULTRAM) 50 MG tablet  Every 12 hours PRN        08/31/20 2310           Jacqlyn Larsen, [place] 08/31/20 2357    Drenda Freeze, MD 09/01/20 1606

## 2020-08-31 NOTE — Discharge Instructions (Addendum)
Your CT scan shows some inflammation at your prior surgical site, this is not changed from her previous surgery but you have constipation in this area that could be contributing to your pain, you also have a UTI.  Please take antibiotics 4 times daily for the next week.    To help relieve constipation please make sure you're drinking plenty of water, continue taking your Metamucil fiber supplement, in addition to this please take 1 capful of MiraLAX daily.  This should help you to have more regular bowel movements.  You can use tramadol once every 12 hours for severe pain, in addition to this you can take Tylenol every 6 hours to help with pain.  If symptoms or not improving I would like for you to follow-up with your PCP as well as your GI doctor.  If you develop fevers, worsening abdominal pain or any other new or concerning symptoms please return for reevaluation.

## 2020-08-31 NOTE — ED Triage Notes (Signed)
Pt with RLQ pain since yesterday. Was constipated prior to yesterday, although did have four bowel movements yesterday. Hx colon cancer, is not receiving tx. Pain worse with movement and palpation.

## 2020-09-01 NOTE — Discharge Planning (Signed)
RNCM consulted regarding transportation needs and pt requiring assistance in getting into her apartment as she lives alone and her key was left in her apartment when EMS picked her up.  Pt has been able to reach her son, who will transport her home and get her into her apartment.  No further RNCM needs identified at this time.

## 2020-09-03 LAB — URINE CULTURE: Culture: >=100000 — AB

## 2020-09-13 DIAGNOSIS — N3000 Acute cystitis without hematuria: Secondary | ICD-10-CM | POA: Diagnosis not present

## 2020-09-13 DIAGNOSIS — R1031 Right lower quadrant pain: Secondary | ICD-10-CM | POA: Diagnosis not present

## 2020-09-13 DIAGNOSIS — R634 Abnormal weight loss: Secondary | ICD-10-CM | POA: Diagnosis not present

## 2020-10-06 ENCOUNTER — Other Ambulatory Visit: Payer: Self-pay | Admitting: Hematology

## 2020-10-06 ENCOUNTER — Other Ambulatory Visit: Payer: Self-pay | Admitting: Cardiovascular Disease

## 2020-10-12 DIAGNOSIS — N390 Urinary tract infection, site not specified: Secondary | ICD-10-CM | POA: Diagnosis not present

## 2020-10-15 NOTE — Progress Notes (Signed)
[facility] Note  10/22/2020     CHIEF COMPLAINT Patient presents for Retina Follow Up   HISTORY OF PRESENT ILLNESS: Marissa Caldwell is a 87 y.o. female who presents to the clinic today for:   HPI    Retina Follow Up    Patient presents with  Wet AMD.  In right eye.  This started 4 months ago.  I, the attending physician,  performed the HPI with the patient and updated documentation appropriately.          Comments    Patient here for 4 months retina follow up for exu ARMD OD. Patient states vision about the same. No eye pain.        Last edited by Bernarda Caffey, MD on 10/23/2020  1:43 AM. (History)    pt states vision is stable, she used AT's QID   Referring physician:  [name], MD [place],  [place] [address]  HISTORICAL INFORMATION:   Selected notes from the MEDICAL RECORD NUMBER Referred by Dr. Quentin Ore for concern of exudative ARMD OD   CURRENT MEDICATIONS: Current Outpatient Medications (Ophthalmic Drugs)  Medication Sig  . brimonidine (ALPHAGAN) 0.2 % ophthalmic solution Place 1 drop into the left eye 2 (two) times daily.   Marland Kitchen latanoprost (XALATAN) 0.005 % ophthalmic solution Place 1 drop into both eyes at bedtime.   Current Facility-Administered Medications (Ophthalmic Drugs)  Medication Route  . aflibercept (EYLEA) SOLN 2 mg Intravitreal  . aflibercept (EYLEA) SOLN 2 mg Intravitreal  . aflibercept (EYLEA) SOLN 2 mg Intravitreal  . aflibercept (EYLEA) SOLN 2 mg Intravitreal  . aflibercept (EYLEA) SOLN 2 mg Intravitreal   Current Outpatient Medications (Other)  Medication Sig  . albuterol (PROVENTIL HFA;VENTOLIN HFA) 108 (90 Base) MCG/ACT inhaler Inhale 2 puffs into the lungs every 6 (six) hours as needed (for shortness of breath/wheezing.).   Marland Kitchen aspirin EC 81 MG tablet Take 81 mg by mouth daily.  . budesonide-formoterol (SYMBICORT) 160-4.5 MCG/ACT inhaler Inhale 2 puffs into the lungs 2 (two)  times daily.  . Calcium Carb-Cholecalciferol (CALCIUM + D3) 600-200 MG-UNIT TABS Take 1 tablet by mouth daily.  . cephALEXin (KEFLEX) 500 MG capsule Take 1 capsule every 6 hours for 7 days  . Cholecalciferol (VITAMIN D3) 2000 units capsule Take 4,000 Units by mouth daily.  . Coenzyme Q10 (COQ10) 100 MG CAPS Take 100 mg by mouth daily.  Marland Kitchen levothyroxine (SYNTHROID, LEVOTHROID) 100 MCG tablet Take 100 mcg by mouth daily before breakfast.   . LORazepam (ATIVAN) 0.5 MG tablet Take 0.5 mg by mouth 2 (two) times daily.   . LUTEIN PO Take 1 tablet by mouth daily.  . mirtazapine (REMERON) 15 MG tablet Take 15 mg by mouth at bedtime.  . Multiple Vitamin (MULTIVITAMIN) tablet Take 1 tablet by mouth daily.  . nitroGLYCERIN (NITROSTAT) 0.4 MG SL tablet Place 1 tablet (0.4 mg total) under the tongue every 5 (five) minutes as needed for chest pain.  . Omega-3 Fatty Acids (FISH OIL PO) Take 1 capsule by mouth daily.  . pravastatin (PRAVACHOL) 40 MG tablet TAKE 1 TABLET BY MOUTH DAILY GENERIC EQUIVALENT FOR PRAVACHOL  . tamoxifen (NOLVADEX) 20 MG tablet Take 1 tablet by mouth once daily  . TOVIAZ 4 MG TB24 tablet Take 4 mg by mouth daily.   . traMADol (ULTRAM) 50 MG tablet Take 1 tablet (50 mg total) by mouth every 12 (twelve) hours as needed.   Current Facility-Administered  Medications (Other)  Medication Route  . Bevacizumab (AVASTIN) SOLN 1.25 mg Intravitreal  . Bevacizumab (AVASTIN) SOLN 1.25 mg Intravitreal  . Bevacizumab (AVASTIN) SOLN 1.25 mg Intravitreal      REVIEW OF SYSTEMS: ROS    Positive for: HENT, Endocrine, Cardiovascular, Eyes, Respiratory, Heme/Lymph   Negative for: Constitutional, Gastrointestinal, Neurological, Skin, Genitourinary, Musculoskeletal, Psychiatric, Allergic/Imm   Last edited by Theodore Demark, COA on 10/22/2020  1:45 PM. (History)       ALLERGIES Allergies  Allergen Reactions  . Lipitor [Atorvastatin]     Elevated LFT's    PAST MEDICAL HISTORY Past Medical  History:  Diagnosis Date  . Anxiety   . Arthritis   . Breast cancer ([place])   . CAD (coronary artery disease)    a. 03/2017: 95% LCx stenosis --> PCI/DES placed  . Cataract   . Chest pain 03/27/2017  . Chronic diastolic heart failure ([place]) 06/25/2017  . Chronic lower back pain   . Colon cancer ([place]) 11/2019   s/p colectomy  . COPD (chronic obstructive pulmonary disease) ([place])   . Coronary artery disease   . Diverticulitis   . Diverticulosis   . GERD (gastroesophageal reflux disease)   . Glaucoma   . Heart failure, type unknown ([place]) 03/11/2017  . History of radiation therapy 08/05/19- 09/03/19   Right Breast/ SCV 16 fractions of 2.66 Gy each for a total of 42.56 Gy. Right breast boost 4 fractions of 2 Gy each to total 8 Gy.   Marland Kitchen Hyperlipidemia 03/11/2017  . Hypertension   . Hypothyroidism   . Macular degeneration    wet and dry per pt's son  . Personal history of chemotherapy   . Personal history of radiation therapy   . Pneumonia X 1   "years and years ago" (03/26/2017)  . Shortness of breath 03/11/2017  . Status post dilation of esophageal narrowing    Past Surgical History:  Procedure Laterality Date  . BILATERAL OOPHORECTOMY Bilateral   . BREAST LUMPECTOMY Right 06/19/2019  . BREAST LUMPECTOMY WITH RADIOACTIVE SEED AND SENTINEL LYMPH NODE BIOPSY Right 06/19/2019   Procedure: RIGHT BREAST LUMPECTOMY WITH RADIOACTIVE SEED AND SENTINEL LYMPH NODE BIOPSY;  Surgeon: Jovita Kussmaul, MD;  Location: [facility];  Service: General;  Laterality: Right;  . CATARACT EXTRACTION    . CATARACT EXTRACTION W/ INTRAOCULAR LENS  IMPLANT, BILATERAL Bilateral   . COLOSTOMY     Greater than 10 yrs  . CORONARY ANGIOPLASTY WITH STENT PLACEMENT  03/26/2017  . LAPAROSCOPIC RIGHT COLECTOMY Right 12/15/2019   Procedure: LAPAROSCOPIC ASSISTED RIGHT COLECTOMY;  Surgeon: Jovita Kussmaul, MD;  Location: [facility];  Service: General;  Laterality: Right;  . LEFT HEART CATH AND CORONARY ANGIOGRAPHY N/A 03/26/2017    Procedure: Left Heart Cath and Coronary Angiography;  Surgeon: Belva Crome, MD;  Location: [facility];  Service: Cardiovascular;  Laterality: N/A;  . NERVE SURGERY     "lump on my sciatic nerve was removed years ago"  . SHOULDER OPEN ROTATOR CUFF REPAIR Right   . THYROIDECTOMY, PARTIAL    . TONSILLECTOMY    . UPPER GASTROINTESTINAL ENDOSCOPY     > ten yrs    FAMILY HISTORY Family History  Problem Relation Age of Onset  . CAD Mother   . Cataracts Mother   . Coronary artery disease Father   . Leukemia Sister   . Amblyopia Neg Hx   . Blindness Neg Hx   . Diabetes Neg Hx   . Glaucoma Neg  Hx   . Macular degeneration Neg Hx   . Retinal detachment Neg Hx   . Strabismus Neg Hx   . Retinitis pigmentosa Neg Hx   . Colon cancer Neg Hx   . Colon polyps Neg Hx   . Esophageal cancer Neg Hx   . Rectal cancer Neg Hx   . Stomach cancer Neg Hx     SOCIAL HISTORY Social History   Tobacco Use  . Smoking status: Current Some Day Smoker    Packs/day: 0.50    Years: 60.00    Pack years: 30.00    Types: Cigarettes  . Smokeless tobacco: Never Used  Vaping Use  . Vaping Use: Never used  Substance Use Topics  . Alcohol use: Yes    Comment: twice a month  . Drug use: No         OPHTHALMIC EXAM:  Base Eye Exam    Visual Acuity (Snellen - Linear)      Right Left   Dist cc CF at 1' 20/80 -2   Dist ph cc NI NI   Correction: Glasses       Tonometry (Tonopen, 1:41 PM)      Right Left   Pressure 09 10       Pupils      Dark Light Shape React APD   Right 4 4 Round  +1   Left 4 3 Round Brisk None       Visual Fields (Counting fingers)      Left Right    Full Full       Extraocular Movement      Right Left    Full, Ortho Full, Ortho       Neuro/Psych    Oriented x3: Yes   Mood/Affect: Normal       Dilation    Both eyes: 1.0% Mydriacyl, 2.5% Phenylephrine @ 1:41 PM        Slit Lamp and Fundus Exam    Slit Lamp Exam      Right Left   Lids/Lashes  Dermatochalasis - upper lid, Telangiectasia, Meibomian gland dysfunction - improving Dermatochalasis - upper lid, Telangiectasia   Conjunctiva/Sclera White and quiet White and quiet   Cornea Arcus, mild Debris in tear film Arcus, 2+ diffuse Punctate epithelial erosions, endo pigment / Krunkenberg's spindle   Anterior Chamber Deep and quiet Deep and quiet   Iris Round and dilated Round and dilated   Lens Three piece PC IOL in good position, open PC Three piece PC IOL in good position, clear PC   Vitreous Vitreous syneresis, Posterior vitreous detachment, mild vitreous condensations Mild Vitreous syneresis, Posterior vitreous detachment       Fundus Exam      Right Left   Disc sharp rim, 360 Peripapillary atrophy, 2-3+Pallor 360 Peripapillary atrophy, trace temporal pallor, sharp rim   C/D Ratio 0.3 0.1   Macula Blunted foveal reflex, Drusen, RPE mottling and clumping, +PED, +CNVM, sub-retinal central Disciform scar, no heme Blunted foveal reflex, Drusen, RPE mottling, clumping, and Atrophy, focal GA nasal macula, no heme   Vessels attenuated, Tortuous Vascular attenuation   Periphery Attached, reticular degeneration Attached, reticular degeneration        Refraction    Wearing Rx      Sphere Cylinder Axis   Right -1.00 +1.50 014   Left -1.25 +1.25 174          IMAGING AND PROCEDURES  Imaging and Procedures for @TODAY @  OCT, Retina - OU - Both  Eyes       Right Eye Quality was good. Central Foveal Thickness: 483. Progression has been stable. Findings include subretinal hyper-reflective material, choroidal neovascular membrane, retinal drusen , epiretinal membrane, disciform scar, abnormal foveal contour, pigment epithelial detachment, no IRF, no SRF (persistent thick PED/SRHM/disciform scar -- stable from prior).   Left Eye Quality was good. Central Foveal Thickness: 188. Progression has been stable. Findings include abnormal foveal contour, retinal drusen , no IRF, no SRF, outer  retinal atrophy, pigment epithelial detachment (Significant GA / ORA).   Notes *Images captured and stored on drive  Diagnosis / Impression:  OD: Exudative AMD with significant submacular scar/SRHM --  persistent thick PED/SRHM/disciform scar -- no IRF/SRF OS: Non-exudative AMD w/ Significant GA / ORA  Clinical management:  See below  Abbreviations: NFP - Normal foveal profile. CME - cystoid macular edema. PED - pigment epithelial detachment. IRF - intraretinal fluid. SRF - subretinal fluid. EZ - ellipsoid zone. ERM - epiretinal membrane. ORA - outer retinal atrophy. ORT - outer retinal tubulation. SRHM - subretinal hyper-reflective material                  ASSESSMENT/PLAN:    ICD-10-CM   1. 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 Retinal edema  H35.81 OCT, Retina - OU - Both Eyes  4. Posterior vitreous detachment of both eyes  H43.813   5. Pigmentary glaucoma of both eyes, mild stage  H40.1331   6. Pseudophakia of both eyes  Z96.1   7. Dry eyes  H04.123     1. 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 Kathlen Mody  - S/P IVA OD #1 (05.20.19), #2 (07.02.19), #3 (07.30.19)  - S/P IVE OD #1 (08.27.19), #2 (09.24.19), #3 (11.05.19), #4 (12.17.19), #5 (02.21.20), #6 (05.05.20), # 7 (08.05.20), #8 (9.2.20), #9 (11.10.20), #10 (02.12.21), #11 (05.12.21), #12 (08.13.21)  - VA remains CF  - OCT with stable improvment in peripapillary / nasal macula IRF and SRF; persistent thick PED/SRHM/disciform scar  - discussed findings and guarded prognosis and treatment options  - switch in therapy to Eylea approved through [employer]  - due to stable, inactive submacular scar and VA CF -- recommend holding off on injection today  - pt in agreement  - Eylea informed consent  form signed and scanned on 11.10.2020  - Eylea paperwork and benefits investigation started on 07.30.19 -- Approved for 2021 [employer]  - f/u in 4 months -- DFE/OCT  2. Age related macular degeneration, non-exudative, left eye  - mild progression of geographic atrophy on exam and OCT today  - The incidence, anatomy, and pathology of dry AMD, risk of progression, and the AREDS and AREDS 2 study including smoking risks discussed with patient.  - continue amsler grid monitoring  3. Retinal edema as above  4. PVD / vitreous syneresis OU  Discussed findings and prognosis  No RT or RD on 360 peripheral exam  Reviewed s/s of RT/RD  Strict return precautions for any such RT/RD signs/symptoms  5. Pigmentary glaucoma OU-   - using latanoprost OU qhs / brimonidine BID OU  - IOP good today (09,10)  - under the expert care of Dr. Quentin Ore  - monitor  6. Pseudophakia OU  - s/p CE/IOL OU  - beautiful surgery, doing  well  - monitor  7. Dry eyes OU  - recommend artificial tears and lubricating ointment as needed    Ophthalmic Meds Ordered this visit:  No orders of the defined types were placed in this encounter.      Return in about 4 months (around 02/21/2021) for f/u exu ARMD OD, DFE, OCT.  There are no Patient Instructions on file for this visit.  This document serves as a record of services personally performed by Gardiner Sleeper, MD, PhD. It was created on their behalf by San Jetty. Owens Shark, OA an ophthalmic technician. The creation of this record is the provider's dictation and/or activities during the visit.    Electronically signed by: San Jetty. Owens Shark, [place] 02.25.2022 1:47 AM  Gardiner Sleeper, M.D., Ph.D. Diseases & Surgery of the Retina and Vitreous [facility]  I have reviewed the above documentation for accuracy and completeness, and I agree with the above. Gardiner Sleeper, M.D., Ph.D. 10/23/20 1:47 AM   Abbreviations: M myopia (nearsighted); A  astigmatism; H hyperopia (farsighted); P presbyopia; Mrx spectacle prescription;  CTL contact lenses; OD right eye; OS left eye; OU both eyes  XT exotropia; ET esotropia; PEK punctate epithelial keratitis; PEE punctate epithelial erosions; DES dry eye syndrome; MGD meibomian gland dysfunction; ATs artificial tears; PFAT's preservative free artificial tears; [place] nuclear sclerotic cataract; PSC posterior subcapsular cataract; ERM epi-retinal membrane; PVD posterior vitreous detachment; RD retinal detachment; DM diabetes mellitus; DR diabetic retinopathy; NPDR non-proliferative diabetic retinopathy; PDR proliferative diabetic retinopathy; CSME clinically significant macular edema; DME diabetic macular edema; dbh dot blot hemorrhages; CWS cotton wool spot; POAG primary open angle glaucoma; C/D cup-to-disc ratio; HVF humphrey visual field; GVF goldmann visual field; OCT optical coherence tomography; IOP intraocular pressure; BRVO Branch retinal vein occlusion; CRVO central retinal vein occlusion; CRAO central retinal artery occlusion; BRAO branch retinal artery occlusion; RT retinal tear; SB scleral buckle; PPV pars plana vitrectomy; VH Vitreous hemorrhage; PRP panretinal laser photocoagulation; IVK intravitreal kenalog; VMT vitreomacular traction; MH Macular hole;  NVD neovascularization of the disc; NVE neovascularization elsewhere; AREDS age related eye disease study; ARMD age related macular degeneration; POAG primary open angle glaucoma; EBMD epithelial/anterior basement membrane dystrophy; ACIOL anterior chamber intraocular lens; IOL intraocular lens; PCIOL posterior chamber intraocular lens; Phaco/IOL phacoemulsification with intraocular lens placement; [place] photorefractive keratectomy; LASIK laser assisted in situ keratomileusis; HTN hypertension; DM diabetes mellitus; COPD chronic obstructive pulmonary disease

## 2020-10-22 ENCOUNTER — Other Ambulatory Visit: Payer: Self-pay

## 2020-10-22 ENCOUNTER — Encounter (INDEPENDENT_AMBULATORY_CARE_PROVIDER_SITE_OTHER): Payer: Self-pay | Admitting: Ophthalmology

## 2020-10-22 ENCOUNTER — Ambulatory Visit (INDEPENDENT_AMBULATORY_CARE_PROVIDER_SITE_OTHER): Payer: Medicare Other | Admitting: Ophthalmology

## 2020-10-22 DIAGNOSIS — H401331 Pigmentary glaucoma, bilateral, mild stage: Secondary | ICD-10-CM

## 2020-10-22 DIAGNOSIS — H353122 Nonexudative age-related macular degeneration, left eye, intermediate dry stage: Secondary | ICD-10-CM

## 2020-10-22 DIAGNOSIS — Z961 Presence of intraocular lens: Secondary | ICD-10-CM

## 2020-10-22 DIAGNOSIS — H353211 Exudative age-related macular degeneration, right eye, with active choroidal neovascularization: Secondary | ICD-10-CM

## 2020-10-22 DIAGNOSIS — H353213 Exudative age-related macular degeneration, right eye, with inactive scar: Secondary | ICD-10-CM

## 2020-10-22 DIAGNOSIS — H43813 Vitreous degeneration, bilateral: Secondary | ICD-10-CM | POA: Diagnosis not present

## 2020-10-22 DIAGNOSIS — H3581 Retinal edema: Secondary | ICD-10-CM

## 2020-10-22 DIAGNOSIS — H04123 Dry eye syndrome of bilateral lacrimal glands: Secondary | ICD-10-CM

## 2020-10-23 ENCOUNTER — Encounter (INDEPENDENT_AMBULATORY_CARE_PROVIDER_SITE_OTHER): Payer: Self-pay | Admitting: Ophthalmology

## 2021-01-05 DIAGNOSIS — F419 Anxiety disorder, unspecified: Secondary | ICD-10-CM | POA: Diagnosis not present

## 2021-01-05 DIAGNOSIS — I10 Essential (primary) hypertension: Secondary | ICD-10-CM | POA: Diagnosis not present

## 2021-01-05 DIAGNOSIS — J449 Chronic obstructive pulmonary disease, unspecified: Secondary | ICD-10-CM | POA: Diagnosis not present

## 2021-01-05 DIAGNOSIS — E785 Hyperlipidemia, unspecified: Secondary | ICD-10-CM | POA: Diagnosis not present

## 2021-01-31 NOTE — Progress Notes (Signed)
[place]   [phone] [phone]) [phone]   Clinic Follow up Note   Patient Care Team: [name], MD as PCP - General (Family Medicine) Skeet Latch, MD as PCP - Cardiology (Cardiology) Rockwell Germany, RN as Oncology Nurse Navigator Mauro Kaufmann, RN as Oncology Nurse Navigator Jovita Kussmaul, MD as Consulting Physician (General Surgery) [name], MD as Consulting Physician (Hematology) Eppie Gibson, MD as Attending Physician (Radiation Oncology)  Date of Service:  02/04/2021  CHIEF COMPLAINT: F/u of right breast cancer and colon cancer   SUMMARY OF ONCOLOGIC HISTORY: Oncology History Overview Note  Cancer Staging Cancer of right colon [facility]) Staging form: Colon and Rectum, AJCC 8th Edition - Pathologic stage from 12/15/2019: Stage I (pT2, pN0, cM0) - Signed by [name], MD on 01/08/2020  Malignant neoplasm of upper-outer quadrant of right breast in female, estrogen receptor positive ([place]) Staging form: Breast, AJCC 8th Edition - Clinical: Stage IIA (cT1c, cN1, cM0, G3, ER+, PR+, HER2-) - Signed by [name], MD on 04/09/2019 - Pathologic stage from 06/19/2019: Stage IB (pT1c, pN2a, cM0, G2, ER+, PR+, HER2-) - Signed by [name], MD on 07/03/2019     Malignant neoplasm of upper-outer quadrant of right breast in female, estrogen receptor positive ([place])  03/19/2019 Mammogram   Diagnostic Mammogram 03/19/19  IMPRESSION The 2 cm distortion in the  right breast 9:30 position middle depth 3cm from the nipple is indeterminate. [place] recommended.  There is also a 1.2cmx3.2cm enlarged right axillary LN highly suggestive of malignancy.     03/31/2019 Initial Biopsy   Diagnosis 03/31/19  1. Breast, right, needle core biopsy, 9:30 o'clock, mid depth, 3cmfn - INVASIVE DUCTAL CARCINOMA. - LYMPHOVASCULAR INVASION IS IDENTIFIED. - SEE COMMENT. 2. Lymph node, needle/core biopsy, right axilla - INVASIVE DUCTAL CARCINOMA. - SEE COMMENT.   03/31/2019  Receptors her2   1. PROGNOSTIC INDICATORS Results: IMMUNOHISTOCHEMICAL AND MORPHOMETRIC ANALYSIS PERFORMED MANUALLY The tumor cells are NEGATIVE for Her2 (0). Estrogen Receptor: 100%, POSITIVE, STRONG STAINING INTENSITY Progesterone Receptor: 10%, POSITIVE, STRONG STAINING INTENSITY Proliferation Marker Ki67: 10%  2. PROGNOSTIC INDICATORS Results: IMMUNOHISTOCHEMICAL AND MORPHOMETRIC ANALYSIS PERFORMED MANUALLY Estrogen Receptor: 100%, POSITIVE, STRONG STAINING INTENSITY Progesterone Receptor: 0%, NEGATIVE Proliferation Marker Ki67: 40%   04/03/2019 Initial Diagnosis   Malignant neoplasm of upper-outer quadrant of right breast in female, estrogen receptor positive ([place])   04/09/2019 Cancer Staging   Staging form: Breast, AJCC 8th Edition - Clinical: Stage IIA (cT1c, cN1, cM0, G3, ER+, PR+, HER2-) - Signed by [name], MD on 04/09/2019  Staging form: Breast, AJCC 8th Edition - Clinical: Stage IIA (cT1c, cN1, cM0, G3, ER+, PR+, HER2-) - Signed by [name], MD on 04/09/2019    03/2019 -  Anti-estrogen oral therapy   Tamoxifen 6m daily starting 03/2019. Held 05/30/19 -07/2019 for surgery.   06/19/2019 Surgery   RIGHT BREAST LUMPECTOMY WITH RADIOACTIVE SEED AND SENTINEL LYMPH NODE BIOPSY by Dr [name] 06/19/19    06/19/2019 Pathology Results   FINAL MICROSCOPIC DIAGNOSIS: 06/19/19  A. BREAST, RIGHT, LUMPECTOMY:  -  Mucinous carcinoma, Nottingham grade 2  -  Margins uninvolved by carcinoma (0.2 cm; medial margin)  -  Lymphovascular space invasion present  -  Previous biopsy site changes present  -  See oncology table and comment below   B. LYMPH NODE, RIGHT AXILLARY #1, SENTINEL, BIOPSY:  -  Metastatic carcinoma involving two lymph nodes (2/2)  -  Extracapsular extension present  -  See comment   C. BREAST,  RIGHT MEDIAL MARGIN, EXCISION:  -  Residual invasive carcinoma  -  Usual ductal hyperplasia with calcifications  -  See comment   D. LYMPH NODE, RIGHT AXILLARY #2,  SENTINEL, BIOPSY:  -  Metastatic carcinoma involving one lymph node (1/1)   E. LYMPH NODE, RIGHT AXILLARY #1 , BIOPSY:  -  Metastatic carcinoma involving one lymph node (1/1)   F. LYMPH NODE, RIGHT AXILLARY #2, BIOPSY:  -  No carcinoma identified in one lymph node (0/1)    06/19/2019 Cancer Staging   Staging form: Breast, AJCC 8th Edition - Pathologic stage from 06/19/2019: Stage IB (pT1c, pN2a, cM0, G2, ER+, PR+, HER2-) - Signed by [name], MD on 07/03/2019    07/22/2019 PET scan   IMPRESSION: 1. Low level hypermetabolism identified in the surgical beds of the right breast and right axilla. This is presumably related to the surgery. 2. Tiny right subpectoral lymph nodes are asymmetric in concerning for metastatic disease on CT imaging. No hypermetabolism on the PET study although size of these lymph nodes is below reliable resolution on PET imaging. 3. 10 mm short axis precarinal lymph node shows low level hypermetabolism. Metastatic involvement not excluded. 4. 11 mm ground-glass nodule in right upper lobe shows low level FDG accumulation. Well differentiated or low-grade neoplasm is a distinct concern. Close follow-up recommended. 5. Focal hypermetabolism in the right colon, potentially related to the ileocecal valve but incompletely characterized. Adenoma or carcinoma could have this appearance. Correlation with colorectal cancer screening history recommended. 6. Focal hypermetabolism identified in the region of the left pubic bone and adjacent sigmoid colon. No underlying bony or colonic lesion evident. Metastatic involvement of the left pubic bone not excluded. 7.  Aortic Atherosclerois (ICD10-170.0)     08/05/2019 - 09/03/2019 Radiation Therapy   Adjuvant RT with Dr Isidore Moos 08/05/19-09/03/19.    Cancer of right colon ([place])  10/15/2019 Procedure   Colonoscopy by Dr Silverio Decamp  IMPRESSION - Likely malignant tumor in the proximal ascending colon. Biopsied. Tattooed. -  Severe diverticulosis in the sigmoid colon, in the descending colon, in the transverse colon and in the ascending colon. - Non-bleeding internal hemorrhoids.   10/15/2019 Initial Biopsy   Diagnosis Ascending Colon Polyp, mass - ADENOCARCINOMA. Microscopic Comment IHC for MMR will be reported separately. Results reported to Dr. Harl Bowie on 10/16/2019. Intradepartmental consultation (Dr. Vic Ripper).   12/15/2019 Initial Diagnosis   Cancer of right colon ([place])    12/15/2019 Cancer Staging   Staging form: Colon and Rectum, AJCC 8th Edition - Pathologic stage from 12/15/2019: Stage I (pT2, pN0, cM0) - Signed by [name], MD on 01/08/2020    12/15/2019 Surgery   LAPAROSCOPIC ASSISTED RIGHT COLECTOMY by Dr Marlou Starks and Dr Marcello Moores   12/15/2019 Pathology Results   FINAL MICROSCOPIC DIAGNOSIS:   A. COLON, TERMINAL ILEUM AND RIGHT, COLECTOMY:  - Invasive moderately differentiated adenocarcinoma, 4.6 cm, involving  ascending colon  - Carcinoma invades into deeper muscularis propria but not beyond  - Resection margins are negative for carcinoma  - Negative for lymphovascular or perineural invasion  - Unremarkable appendix  - Nineteen lymph nodes, negative for carcinoma (0/19)  - See oncology table    12/15/2019 Genetic Testing   ADDENDUM:   Mismatch Repair Protein (IHC)  SUMMARY INTERPRETATION: ABNORMAL  There is loss of the major and minor MMR proteins MLH1 and PMS2. The  loss of expression may be secondary to promoter hyper-methylation, gene  mutation or other genetic event. BRAF mutation testing and/or MLH1  methylation  testing is indicated. The presence of a BRAF mutation and/or  MLH1 hypermethylation is indicative of a sporadic-type tumor. The  absence of either BRAF mutation and/or presence of normal methylation  indicate the possible presence of a hereditary germline mutation (e.g.  Lynch syndrome) and referral to genetic counseling is warranted. It is  recommended that the  loss of protein expression be correlated with  molecular based MSI testing.   IHC EXPRESSION RESULTS  TEST           RESULT  MLH1:          LOSS OF NUCLEAR EXPRESSION  MSH2:          Preserved nuclear expression  MSH6:          Preserved nuclear expression  PMS2:          LOSS OF NUCLEAR EXPRESSION    12/29/2019 Imaging   CT AP W contrast  IMPRESSION: 1. Sigmoid diverticulosis without inflammation. Status post right colectomy. 2. Multiple small calcified uterine fibroids. 3. No acute abnormality seen in the abdomen or pelvis.   Aortic Atherosclerosis (ICD10-I70.0).     01/28/2020 Imaging   CT AP w contrast  IMPRESSION: 1. Moderate irregular wall thickening of the right colon with some areas of low attenuation. Findings could be due to cecal diverticulitis or other inflammatory or infectious colitis. I doubt that this represents recurrent colon cancer. 2. Status post partial right colectomy. No findings for locoregional adenopathy or metastatic disease. 3. Stable advanced atherosclerotic calcifications involving the aorta and branch vessels. 4. Stable benign-appearing central hepatic cysts. 5. Stable calcified uterine fibroids. 6. Aortic atherosclerosis.   Aortic Atherosclerosis (ICD10-I70.0).   04/30/2020 Imaging   CT Chest  IMPRESSION: 1. Ground-glass nodule in the RIGHT upper lobe measuring 1.4 x 0.9 cm previously 1.4 x 0.9 cm. 2. Ground-glass nodule in the LEFT upper lobe measuring 11 x 12 mm, spiculated margins and mild fissural distortion, accompanying features of this nodule. Findings are concerning for for multifocal bronchogenic neoplasm given persistence. 3. Stable size of small nodes along the RIGHT paratracheal chain largest approximately 9 mm. 4. Stable hepatic hemangioma. 5. Dilation of the ascending aorta to 3.7 cm, similar to previous exams. 6. Three-vessel coronary artery disease. 7. Aortic atherosclerosis.   Aortic Atherosclerosis (ICD10-I70.0).      02/01/2021 Imaging   CT CAP  IMPRESSION: No evidence of recurrent or metastatic carcinoma within the abdomen or pelvis. No evidence of thoracic metastatic disease.   Stable hepatic hemangioma.   Stable small calcified uterine fibroids.   Colonic diverticulosis. No radiographic evidence of diverticulitis.   Sub-solid pulmonary nodule in anterior right upper lobe shows increased solid component, suspicious for primary lung adenocarcinoma. PET-CT is recommended for further evaluation.   Stable 11 mm ground-glass nodule in superior segment of left lower lobe. Recommend continued attention on follow-up imaging, and this could also be assessed by PET-CT.   Aortic Atherosclerosis (ICD10-I70.0) and Emphysema (ICD10-J43.9).        CURRENT THERAPY:  Tamoxifen 84m once daily since 03/2019. Held 05/2019-07/2019 for surgery.   INTERVAL HISTORY:  PAubrionna IstreHypes is here for a follow up of right breast cancer. She was last seen by me 6 months ago. She presents to the clinic with her son. She notes she is doing well. She is stable with no major changes. She has arthritis back pain which is stable. She denies cough and uses inhaler as needed. No major changes in her breathing, she still has mild SOB. She  is still on Tamoxifen and tolerating well. She notes only occasional nausea.     REVIEW OF SYSTEMS:   Constitutional: Denies fevers, chills or abnormal weight loss Eyes: Denies blurriness of vision Ears, nose, mouth, throat, and face: Denies mucositis or sore throat Respiratory: Denies dyspnea  Cardiovascular: Denies palpitation, chest discomfort or lower extremity swelling Gastrointestinal:  Denies heartburn or change in bowel habits (+) Occasional nausea  Skin: Denies abnormal skin rashes Lymphatics: Denies new lymphadenopathy or easy bruising Neurological:Denies numbness, tingling or new weaknesses Behavioral/Psych: Mood is stable, no new changes  All other systems were reviewed  with the patient and are negative.  MEDICAL HISTORY:  Past Medical History:  Diagnosis Date   Anxiety    Arthritis    Breast cancer ([place])    CAD (coronary artery disease)    a. 03/2017: 95% LCx stenosis --> PCI/DES placed   Cataract    Chest pain 03/27/2017   Chronic diastolic heart failure ([place]) 06/25/2017   Chronic lower back pain    Colon cancer ([place]) 11/2019   s/p colectomy   COPD (chronic obstructive pulmonary disease) ([place])    Coronary artery disease    Diverticulitis    Diverticulosis    GERD (gastroesophageal reflux disease)    Glaucoma    Heart failure, type unknown ([place]) 03/11/2017   History of radiation therapy 08/05/19- 09/03/19   Right Breast/ SCV 16 fractions of 2.66 Gy each for a total of 42.56 Gy. Right breast boost 4 fractions of 2 Gy each to total 8 Gy.    Hyperlipidemia 03/11/2017   Hypertension    Hypothyroidism    Macular degeneration    wet and dry per pt's son   Personal history of chemotherapy    Personal history of radiation therapy    Pneumonia X 1   "years and years ago" (03/26/2017)   Shortness of breath 03/11/2017   Status post dilation of esophageal narrowing     SURGICAL HISTORY: Past Surgical History:  Procedure Laterality Date   BILATERAL OOPHORECTOMY Bilateral    BREAST LUMPECTOMY Right 06/19/2019   BREAST LUMPECTOMY WITH RADIOACTIVE SEED AND SENTINEL LYMPH NODE BIOPSY Right 06/19/2019   Procedure: RIGHT BREAST LUMPECTOMY WITH RADIOACTIVE SEED AND SENTINEL LYMPH NODE BIOPSY;  Surgeon: Jovita Kussmaul, MD;  Location: [facility];  Service: General;  Laterality: Right;   CATARACT EXTRACTION     CATARACT EXTRACTION W/ INTRAOCULAR LENS  IMPLANT, BILATERAL Bilateral    COLOSTOMY     Greater than 10 yrs   CORONARY ANGIOPLASTY WITH STENT PLACEMENT  03/26/2017   LAPAROSCOPIC RIGHT COLECTOMY Right 12/15/2019   Procedure: LAPAROSCOPIC ASSISTED RIGHT COLECTOMY;  Surgeon: Jovita Kussmaul, MD;  Location: [facility];  Service: General;  Laterality: Right;   LEFT HEART  CATH AND CORONARY ANGIOGRAPHY N/A 03/26/2017   Procedure: Left Heart Cath and Coronary Angiography;  Surgeon: Belva Crome, MD;  Location: [facility];  Service: Cardiovascular;  Laterality: N/A;   NERVE SURGERY     "lump on my sciatic nerve was removed years ago"   SHOULDER OPEN ROTATOR CUFF REPAIR Right    THYROIDECTOMY, PARTIAL     TONSILLECTOMY     UPPER GASTROINTESTINAL ENDOSCOPY     > ten yrs    I have reviewed the social history and family history with the patient and they are unchanged from previous note.  ALLERGIES:  is allergic to lipitor [atorvastatin].  MEDICATIONS:  Current Outpatient Medications  Medication Sig Dispense Refill   albuterol (PROVENTIL  HFA;VENTOLIN HFA) 108 (90 Base) MCG/ACT inhaler Inhale 2 puffs into the lungs every 6 (six) hours as needed (for shortness of breath/wheezing.).      aspirin EC 81 MG tablet Take 81 mg by mouth daily.     brimonidine (ALPHAGAN) 0.2 % ophthalmic solution Place 1 drop into the left eye 2 (two) times daily.      budesonide-formoterol (SYMBICORT) 160-4.5 MCG/ACT inhaler Inhale 2 puffs into the lungs 2 (two) times daily.     Calcium Carb-Cholecalciferol (CALCIUM + D3) 600-200 MG-UNIT TABS Take 1 tablet by mouth daily.     cephALEXin (KEFLEX) 500 MG capsule Take 1 capsule every 6 hours for 7 days 28 capsule 0   Cholecalciferol (VITAMIN D3) 2000 units capsule Take 4,000 Units by mouth daily.     Coenzyme Q10 (COQ10) 100 MG CAPS Take 100 mg by mouth daily.     latanoprost (XALATAN) 0.005 % ophthalmic solution Place 1 drop into both eyes at bedtime.     levothyroxine (SYNTHROID, LEVOTHROID) 100 MCG tablet Take 100 mcg by mouth daily before breakfast.   0   LORazepam (ATIVAN) 0.5 MG tablet Take 0.5 mg by mouth 2 (two) times daily.      LUTEIN PO Take 1 tablet by mouth daily.     mirtazapine (REMERON) 15 MG tablet Take 15 mg by mouth at bedtime.     Multiple Vitamin (MULTIVITAMIN) tablet Take 1 tablet by mouth daily.      nitroGLYCERIN (NITROSTAT) 0.4 MG SL tablet Place 1 tablet (0.4 mg total) under the tongue every 5 (five) minutes as needed for chest pain. 25 tablet 3   Omega-3 Fatty Acids (FISH OIL PO) Take 1 capsule by mouth daily.     pravastatin (PRAVACHOL) 40 MG tablet TAKE 1 TABLET BY MOUTH DAILY GENERIC EQUIVALENT FOR PRAVACHOL 90 tablet 0   tamoxifen (NOLVADEX) 20 MG tablet Take 1 tablet by mouth once daily 90 tablet 3   TOVIAZ 4 MG TB24 tablet Take 4 mg by mouth daily.   1   traMADol (ULTRAM) 50 MG tablet Take 1 tablet (50 mg total) by mouth every 12 (twelve) hours as needed. 6 tablet 0   Current Facility-Administered Medications  Medication Dose Route Frequency Provider Last Rate Last Admin   aflibercept (EYLEA) SOLN 2 mg  2 mg Intravitreal  Bernarda Caffey, MD   2 mg at 04/16/18 1424   aflibercept (EYLEA) SOLN 2 mg  2 mg Intravitreal  Bernarda Caffey, MD   2 mg at 05/14/18 1358   aflibercept (EYLEA) SOLN 2 mg  2 mg Intravitreal  Bernarda Caffey, MD   2 mg at 06/25/18 1433   aflibercept (EYLEA) SOLN 2 mg  2 mg Intravitreal  Bernarda Caffey, MD   2 mg at 08/07/18 1557   aflibercept (EYLEA) SOLN 2 mg  2 mg Intravitreal  Bernarda Caffey, MD   2 mg at 10/11/18 1522   Bevacizumab (AVASTIN) SOLN 1.25 mg  1.25 mg Intravitreal  Bernarda Caffey, MD   1.25 mg at 01/07/18 1429   Bevacizumab (AVASTIN) SOLN 1.25 mg  1.25 mg Intravitreal  Bernarda Caffey, MD   1.25 mg at 02/19/18 1356   Bevacizumab (AVASTIN) SOLN 1.25 mg  1.25 mg Intravitreal  Bernarda Caffey, MD   1.25 mg at 03/19/18 1347    PHYSICAL EXAMINATION: ECOG PERFORMANCE STATUS: 2 - Symptomatic, <50% confined to bed  Vitals:   02/04/21 1441  BP: (!) 151/84  Pulse: 75  Resp: 18  Temp: 98 F (36.7 C)  SpO2: 99%   Filed Weights   02/04/21 1441  Weight: 146 lb 11.2 oz (66.5 kg)    GENERAL:alert, no distress and comfortable SKIN: skin color, texture, turgor are normal, no rashes or significant lesions EYES: normal, Conjunctiva are pink and non-injected,  sclera clear  NECK: supple, thyroid normal size, non-tender, without nodularity LYMPH:  no palpable lymphadenopathy in the cervical, axillary  LUNGS: clear to auscultation and percussion with normal breathing effort HEART: regular rate & rhythm and no murmurs and no lower extremity edema ABDOMEN:abdomen soft, non-tender and normal bowel sounds Musculoskeletal:no cyanosis of digits and no clubbing  NEURO: alert & oriented x 3 with fluent speech, no focal motor/sensory deficits BREAST: S/p right lumpectomy: surgical incision healed well with soreness at right axillary soreness. No palpable mass, nodules or adenopathy bilaterally. Breast exam benign.   LABORATORY DATA:  I have reviewed the data as listed CBC Latest Ref Rng & Units 02/01/2021 08/31/2020 08/06/2020  WBC 4.0 - 10.5 K/uL 5.8 8.9 5.0  Hemoglobin 12.0 - 15.0 g/dL 13.2 12.6 11.4(L)  Hematocrit 36.0 - 46.0 % 38.6 37.3 33.8(L)  Platelets 150 - 400 K/uL 212 199 171     CMP Latest Ref Rng & Units 02/01/2021 08/31/2020 08/06/2020  Glucose 70 - 99 mg/dL 95 119(H) 86  BUN 8 - 23 mg/dL 16 17 29(H)  Creatinine 0.44 - 1.00 mg/dL 0.76 0.75 0.74  Sodium 135 - 145 mmol/L 140 135 138  Potassium 3.5 - 5.1 mmol/L 4.1 3.8 4.2  Chloride 98 - 111 mmol/L 104 101 106  CO2 22 - 32 mmol/L _0 Calcium 8.9 - 10.3 mg/dL 9.7 9.1 9.1  Total Protein 6.5 - 8.1 g/dL 7.3 6.7 6.8  Total Bilirubin 0.3 - 1.2 mg/dL 0.5 0.5 0.4  Alkaline Phos 38 - 126 U/L 47 43 43  AST 15 - 41 U/L _1 ALT 0 - 44 U/L _2 RADIOGRAPHIC STUDIES: I have personally reviewed the radiological images as listed and agreed with the findings in the report. No results found.   ASSESSMENT & PLAN:  Minahil Quinlivan Eller is a 87 y.o. female with    1. Cancer of right colon, pT2N0M0 -She was diagnosed in 09/2019 by colonoscopy with Dr Silverio Decamp. -She underwent right colectomy on 12/15/19 with Dr. Marlou Starks. Her path showed 4.6cm invasive moderately differentiated  adenocarcinoma which invades the muscularis propria. Negative margins and LNs.  -Her 02/01/21 CT CAP which showed No evidence of recurrent or metastatic carcinoma within the abdomen or pelvis. No evidence of thoracic metastatic disease with Stable hepatic hemangioma, stable small calcified uterine fibroids and colonic diverticulosis. No radiographic evidence of diverticulitis. She does have right lung nodule that has increased solid components which is indeterminate. Will continue to monitor with PET scan in 4-5 months.  -From a colon cancer standpoint, she is clinically stable. Labs reviewed from this week, CBC and CMP WNL. Physical exam unremarkable.  -Continue surveillance. She has not proceeded with 2022 Colonoscopy.  -f/u in 4 months.     2. Malignant neoplasm of upper-outer quadrant of right breast, invasive ductal carcinoma, pT1cN2aMx, ER/PR+, HER2-, Grade II -She was diagnosed in 03/2019 with invasive ductal carcinoma which spread to her local LN. -She underwent right breast lumpectomy with targeted lymph node dissection by Dr Marlou Starks on 06/19/19, adjuvant RT with Dr. Isidore Moos 08/05/19-09/03/19.  -She started Tamoxifen in 03/2019 and held 05/2019-07/2019 for surgery.  -From a breast cancer standpoint, she is stable.  Breast exam unremarkable with mild tenderness at surgical site (02/04/21).  -Continue Tamoxifen and surveillance with yearly mammograms. Next mammogram in 03/2021.    3. COPD, history of heavy smoking, bilateral lung nodules  -On inhalers, not on home oxygen  -She still smokes occasionally. She has not completely quit.   -PET from 12/1/120 shows 43m ground-glass nodules in RU lung. Persists on 04/30/20 CT Chest. Her right lung nodule is more solidified on 02/01/21 CT CAP, but overall appears to be stable to me. I personally reviewed her recent CT and compared to previous CT and PET with pt and her son  -Will repeat with PET in 4 months.   4. Underweight, Fatigue  -After drastic change in  diet she had lowered appetite and lost significant weight in a year. She has started to gain some of that weight back.  -She is no longer on Mirtazapine. She still eats fairly. Her weight is stable. -Albumin is still low. I again encouraged her to have high calorie, high protein diet.    5. Comorbidities: CAD, HTN, Thyroid dysfunction, Osteoporosis, Arthritis, Macular Degeneration of both eyes, hearing loss, Anxiety/Depression.  -She has had a cardiac stent placed in the past. She will continue to follow up with her cardiologist. -She is on Coreg, nitro, pravastatin and other meds for her heart and thyroid medication  -She has received Prolia injections q692monthwith Dr. WhDema SeverinShe will continue Calcium and Vit D.  -On Zoloft, mood is stable. Will monitor while on Tamoxifen given mild interaction.    PLAN:  -Continue Tamoxifen  -Mammogram in 03/2021.  -F/u in 4 months with lab and PET scan a few days before.    No problem-specific Assessment & Plan notes found for this encounter.   Orders Placed This Encounter  Procedures   MM DIAG BREAST TOMO BILATERAL    Standing Status:   Future    Standing Expiration Date:   02/04/2022    Order Specific Question:   Reason for Exam (SYMPTOM  OR DIAGNOSIS REQUIRED)    Answer:   [email or web address]    Order Specific Question:   [facility]?    [facility] All questions were answered. The patient knows to call the clinic with any problems, questions or concerns. No barriers to learning was detected. The total time spent in the appointment was 30 minutes.     YaTruitt MerleMD 02/04/2021   I, AmJoslyn Devonam acting as scribe for YaTruitt MerleMD.   I have reviewed the above documentation for accuracy and completeness, and I agree with the above.

## 2021-02-01 ENCOUNTER — Inpatient Hospital Stay: Payer: Medicare Other | Attending: Hematology

## 2021-02-01 ENCOUNTER — Other Ambulatory Visit: Payer: Self-pay

## 2021-02-01 ENCOUNTER — Ambulatory Visit (HOSPITAL_COMMUNITY)
Admission: RE | Admit: 2021-02-01 | Discharge: 2021-02-01 | Disposition: A | Discharge status: Home / Self Care | Payer: Medicare Other | Source: Ambulatory Visit | Attending: Hematology | Admitting: Hematology

## 2021-02-01 DIAGNOSIS — Z85038 Personal history of other malignant neoplasm of large intestine: Secondary | ICD-10-CM | POA: Insufficient documentation

## 2021-02-01 DIAGNOSIS — E079 Disorder of thyroid, unspecified: Secondary | ICD-10-CM | POA: Diagnosis not present

## 2021-02-01 DIAGNOSIS — I10 Essential (primary) hypertension: Secondary | ICD-10-CM | POA: Diagnosis not present

## 2021-02-01 DIAGNOSIS — C50411 Malignant neoplasm of upper-outer quadrant of right female breast: Secondary | ICD-10-CM | POA: Insufficient documentation

## 2021-02-01 DIAGNOSIS — Z170 Estrogen receptor positive status [ER+]: Secondary | ICD-10-CM | POA: Insufficient documentation

## 2021-02-01 DIAGNOSIS — H9190 Unspecified hearing loss, unspecified ear: Secondary | ICD-10-CM | POA: Insufficient documentation

## 2021-02-01 DIAGNOSIS — F32A Depression, unspecified: Secondary | ICD-10-CM | POA: Diagnosis not present

## 2021-02-01 DIAGNOSIS — F419 Anxiety disorder, unspecified: Secondary | ICD-10-CM | POA: Insufficient documentation

## 2021-02-01 DIAGNOSIS — Z79810 Long term (current) use of selective estrogen receptor modulators (SERMs): Secondary | ICD-10-CM | POA: Diagnosis not present

## 2021-02-01 DIAGNOSIS — D1803 Hemangioma of intra-abdominal structures: Secondary | ICD-10-CM | POA: Diagnosis not present

## 2021-02-01 DIAGNOSIS — I2510 Atherosclerotic heart disease of native coronary artery without angina pectoris: Secondary | ICD-10-CM | POA: Diagnosis not present

## 2021-02-01 DIAGNOSIS — R636 Underweight: Secondary | ICD-10-CM | POA: Insufficient documentation

## 2021-02-01 DIAGNOSIS — Z79899 Other long term (current) drug therapy: Secondary | ICD-10-CM | POA: Diagnosis not present

## 2021-02-01 DIAGNOSIS — C189 Malignant neoplasm of colon, unspecified: Secondary | ICD-10-CM | POA: Diagnosis not present

## 2021-02-01 DIAGNOSIS — R5383 Other fatigue: Secondary | ICD-10-CM | POA: Insufficient documentation

## 2021-02-01 DIAGNOSIS — R918 Other nonspecific abnormal finding of lung field: Secondary | ICD-10-CM | POA: Diagnosis not present

## 2021-02-01 DIAGNOSIS — D259 Leiomyoma of uterus, unspecified: Secondary | ICD-10-CM | POA: Diagnosis not present

## 2021-02-01 DIAGNOSIS — M810 Age-related osteoporosis without current pathological fracture: Secondary | ICD-10-CM | POA: Diagnosis not present

## 2021-02-01 DIAGNOSIS — H3530 Unspecified macular degeneration: Secondary | ICD-10-CM | POA: Insufficient documentation

## 2021-02-01 DIAGNOSIS — J449 Chronic obstructive pulmonary disease, unspecified: Secondary | ICD-10-CM | POA: Insufficient documentation

## 2021-02-01 DIAGNOSIS — Z720 Tobacco use: Secondary | ICD-10-CM | POA: Diagnosis not present

## 2021-02-01 DIAGNOSIS — C182 Malignant neoplasm of ascending colon: Secondary | ICD-10-CM

## 2021-02-01 DIAGNOSIS — K5750 Diverticulosis of both small and large intestine without perforation or abscess without bleeding: Secondary | ICD-10-CM | POA: Diagnosis not present

## 2021-02-01 LAB — CBC WITH DIFFERENTIAL (CANCER CENTER ONLY)
Abs Immature Granulocytes: 0.01 10*3/uL (ref 0.00–0.07)
Basophils Absolute: 0 10*3/uL (ref 0.0–0.1)
Basophils Relative: 1 %
Eosinophils Absolute: 0.1 10*3/uL (ref 0.0–0.5)
Eosinophils Relative: 1 %
HCT: 38.6 % (ref 36.0–46.0)
Hemoglobin: 13.2 g/dL (ref 12.0–15.0)
Immature Granulocytes: 0 %
Lymphocytes Relative: 22 %
Lymphs Abs: 1.3 10*3/uL (ref 0.7–4.0)
MCH: 31.8 pg (ref 26.0–34.0)
MCHC: 34.2 g/dL (ref 30.0–36.0)
MCV: 93 fL (ref 80.0–100.0)
Monocytes Absolute: 0.6 10*3/uL (ref 0.1–1.0)
Monocytes Relative: 11 %
Neutro Abs: 3.8 10*3/uL (ref 1.7–7.7)
Neutrophils Relative %: 65 %
Platelet Count: 212 10*3/uL (ref 150–400)
RBC: 4.15 MIL/uL (ref 3.87–5.11)
RDW: 13 % (ref 11.5–15.5)
WBC Count: 5.8 10*3/uL (ref 4.0–10.5)
nRBC: 0 % (ref 0.0–0.2)

## 2021-02-01 LAB — CMP (CANCER CENTER ONLY)
ALT: 13 U/L (ref 0–44)
AST: 22 U/L (ref 15–41)
Albumin: 3.6 g/dL (ref 3.5–5.0)
Alkaline Phosphatase: 47 U/L (ref 38–126)
Anion gap: 9 (ref 5–15)
BUN: 16 mg/dL (ref 8–23)
CO2: 27 mmol/L (ref 22–32)
Calcium: 9.7 mg/dL (ref 8.9–10.3)
Chloride: 104 mmol/L (ref 98–111)
Creatinine: 0.76 mg/dL (ref 0.44–1.00)
GFR, Estimated: >60 mL/min (ref >60)
Glucose, Bld: 95 mg/dL (ref 70–99)
Potassium: 4.1 mmol/L (ref 3.5–5.1)
Sodium: 140 mmol/L (ref 135–145)
Total Bilirubin: 0.5 mg/dL (ref 0.3–1.2)
Total Protein: 7.3 g/dL (ref 6.5–8.1)

## 2021-02-01 MED ORDER — SODIUM CHLORIDE (PF) 0.9 % IJ SOLN
INTRAMUSCULAR | Status: AC
  Filled 2021-02-01: qty 50

## 2021-02-01 MED ORDER — IOHEXOL 300 MG/ML  SOLN
100.0000 mL | Freq: Once | INTRAMUSCULAR | Status: AC | PRN
  Administered 2021-02-01: 100 mL via INTRAVENOUS

## 2021-02-02 ENCOUNTER — Ambulatory Visit: Payer: Medicare Other | Admitting: Hematology

## 2021-02-04 ENCOUNTER — Other Ambulatory Visit: Payer: Self-pay

## 2021-02-04 ENCOUNTER — Encounter: Payer: Self-pay | Admitting: Hematology

## 2021-02-04 ENCOUNTER — Inpatient Hospital Stay: Payer: Medicare Other | Admitting: Hematology

## 2021-02-04 VITALS — BP 151/84 | HR 75 | Temp 98.0°F | Resp 18 | Ht 70.0 in | Wt 146.7 lb

## 2021-02-04 DIAGNOSIS — J449 Chronic obstructive pulmonary disease, unspecified: Secondary | ICD-10-CM | POA: Diagnosis not present

## 2021-02-04 DIAGNOSIS — M810 Age-related osteoporosis without current pathological fracture: Secondary | ICD-10-CM | POA: Diagnosis not present

## 2021-02-04 DIAGNOSIS — R636 Underweight: Secondary | ICD-10-CM | POA: Diagnosis not present

## 2021-02-04 DIAGNOSIS — F32A Depression, unspecified: Secondary | ICD-10-CM | POA: Diagnosis not present

## 2021-02-04 DIAGNOSIS — I10 Essential (primary) hypertension: Secondary | ICD-10-CM | POA: Diagnosis not present

## 2021-02-04 DIAGNOSIS — R5383 Other fatigue: Secondary | ICD-10-CM | POA: Diagnosis not present

## 2021-02-04 DIAGNOSIS — Z720 Tobacco use: Secondary | ICD-10-CM | POA: Diagnosis not present

## 2021-02-04 DIAGNOSIS — C182 Malignant neoplasm of ascending colon: Secondary | ICD-10-CM | POA: Diagnosis not present

## 2021-02-04 DIAGNOSIS — C50411 Malignant neoplasm of upper-outer quadrant of right female breast: Secondary | ICD-10-CM

## 2021-02-04 DIAGNOSIS — I2510 Atherosclerotic heart disease of native coronary artery without angina pectoris: Secondary | ICD-10-CM | POA: Diagnosis not present

## 2021-02-04 DIAGNOSIS — Z170 Estrogen receptor positive status [ER+]: Secondary | ICD-10-CM | POA: Diagnosis not present

## 2021-02-04 DIAGNOSIS — H3530 Unspecified macular degeneration: Secondary | ICD-10-CM | POA: Diagnosis not present

## 2021-02-04 DIAGNOSIS — R918 Other nonspecific abnormal finding of lung field: Secondary | ICD-10-CM | POA: Diagnosis not present

## 2021-02-04 DIAGNOSIS — Z85038 Personal history of other malignant neoplasm of large intestine: Secondary | ICD-10-CM | POA: Diagnosis not present

## 2021-02-04 DIAGNOSIS — Z79810 Long term (current) use of selective estrogen receptor modulators (SERMs): Secondary | ICD-10-CM | POA: Diagnosis not present

## 2021-02-04 DIAGNOSIS — H9190 Unspecified hearing loss, unspecified ear: Secondary | ICD-10-CM | POA: Diagnosis not present

## 2021-02-04 DIAGNOSIS — E079 Disorder of thyroid, unspecified: Secondary | ICD-10-CM | POA: Diagnosis not present

## 2021-02-07 ENCOUNTER — Other Ambulatory Visit: Payer: Self-pay

## 2021-02-07 DIAGNOSIS — Z170 Estrogen receptor positive status [ER+]: Secondary | ICD-10-CM

## 2021-02-07 DIAGNOSIS — R918 Other nonspecific abnormal finding of lung field: Secondary | ICD-10-CM

## 2021-02-07 DIAGNOSIS — C182 Malignant neoplasm of ascending colon: Secondary | ICD-10-CM

## 2021-03-01 ENCOUNTER — Encounter (INDEPENDENT_AMBULATORY_CARE_PROVIDER_SITE_OTHER): Payer: Self-pay | Admitting: Ophthalmology

## 2021-03-01 ENCOUNTER — Ambulatory Visit (INDEPENDENT_AMBULATORY_CARE_PROVIDER_SITE_OTHER): Payer: Medicare Other | Admitting: Ophthalmology

## 2021-03-01 ENCOUNTER — Other Ambulatory Visit: Payer: Self-pay

## 2021-03-01 DIAGNOSIS — H3581 Retinal edema: Secondary | ICD-10-CM

## 2021-03-01 DIAGNOSIS — H353122 Nonexudative age-related macular degeneration, left eye, intermediate dry stage: Secondary | ICD-10-CM | POA: Diagnosis not present

## 2021-03-01 DIAGNOSIS — H43813 Vitreous degeneration, bilateral: Secondary | ICD-10-CM | POA: Diagnosis not present

## 2021-03-01 DIAGNOSIS — H353213 Exudative age-related macular degeneration, right eye, with inactive scar: Secondary | ICD-10-CM

## 2021-03-01 DIAGNOSIS — H401331 Pigmentary glaucoma, bilateral, mild stage: Secondary | ICD-10-CM

## 2021-03-01 DIAGNOSIS — Z961 Presence of intraocular lens: Secondary | ICD-10-CM

## 2021-03-01 DIAGNOSIS — H04123 Dry eye syndrome of bilateral lacrimal glands: Secondary | ICD-10-CM

## 2021-03-01 NOTE — Progress Notes (Signed)
[facility] Note  03/01/2021     CHIEF COMPLAINT Patient presents for Retina Follow Up   HISTORY OF PRESENT ILLNESS: Marissa Caldwell is a 87 y.o. female who presents to the clinic today for:   HPI     Retina Follow Up   Patient presents with  Wet AMD.  In right eye.  Duration of 4 months.  Since onset it is stable.  I, the attending physician,  performed the HPI with the patient and updated documentation appropriately.        Comments   Pt here for 4 mo ret follow up exu ARMD OD. Pt states she feels her vision has gotten a little worse. She reports things are cloudy consistently.       Last edited by Bernarda Caffey, MD on 03/03/2021  3:04 PM.    Pt states her vision hasn't been "doing very well lately"   Referring physician:  Hortencia Pilar, MD [place],  [place] [address]  HISTORICAL INFORMATION:   Selected notes from the MEDICAL RECORD NUMBER Referred by Dr. Quentin Ore for concern of exudative ARMD OD   CURRENT MEDICATIONS: Current Outpatient Medications (Ophthalmic Drugs)  Medication Sig   brimonidine (ALPHAGAN) 0.2 % ophthalmic solution Place 1 drop into the left eye 2 (two) times daily.    latanoprost (XALATAN) 0.005 % ophthalmic solution Place 1 drop into both eyes at bedtime.   Current Facility-Administered Medications (Ophthalmic Drugs)  Medication Route   aflibercept (EYLEA) SOLN 2 mg Intravitreal   aflibercept (EYLEA) SOLN 2 mg Intravitreal   aflibercept (EYLEA) SOLN 2 mg Intravitreal   aflibercept (EYLEA) SOLN 2 mg Intravitreal   aflibercept (EYLEA) SOLN 2 mg Intravitreal   Current Outpatient Medications (Other)  Medication Sig   albuterol (PROVENTIL HFA;VENTOLIN HFA) 108 (90 Base) MCG/ACT inhaler Inhale 2 puffs into the lungs every 6 (six) hours as needed (for shortness of breath/wheezing.).    aspirin EC 81 MG tablet Take 81 mg by mouth daily.   budesonide-formoterol (SYMBICORT) 160-4.5  MCG/ACT inhaler Inhale 2 puffs into the lungs 2 (two) times daily.   Calcium Carb-Cholecalciferol (CALCIUM + D3) 600-200 MG-UNIT TABS Take 1 tablet by mouth daily.   cephALEXin (KEFLEX) 500 MG capsule Take 1 capsule every 6 hours for 7 days   Cholecalciferol (VITAMIN D3) 2000 units capsule Take 4,000 Units by mouth daily.   Coenzyme Q10 (COQ10) 100 MG CAPS Take 100 mg by mouth daily.   levothyroxine (SYNTHROID, LEVOTHROID) 100 MCG tablet Take 100 mcg by mouth daily before breakfast.    LORazepam (ATIVAN) 0.5 MG tablet Take 0.5 mg by mouth 2 (two) times daily.    LUTEIN PO Take 1 tablet by mouth daily.   mirtazapine (REMERON) 15 MG tablet Take 15 mg by mouth at bedtime.   Multiple Vitamin (MULTIVITAMIN) tablet Take 1 tablet by mouth daily.   nitroGLYCERIN (NITROSTAT) 0.4 MG SL tablet Place 1 tablet (0.4 mg total) under the tongue every 5 (five) minutes as needed for chest pain.   Omega-3 Fatty Acids (FISH OIL PO) Take 1 capsule by mouth daily.   pravastatin (PRAVACHOL) 40 MG tablet TAKE 1 TABLET BY MOUTH DAILY GENERIC EQUIVALENT FOR PRAVACHOL   tamoxifen (NOLVADEX) 20 MG tablet Take 1 tablet by mouth once daily   TOVIAZ 4 MG TB24 tablet Take 4 mg by mouth daily.    traMADol (ULTRAM) 50 MG tablet Take 1 tablet (50 mg total) by mouth every 12 (  twelve) hours as needed.   Current Facility-Administered Medications (Other)  Medication Route   Bevacizumab (AVASTIN) SOLN 1.25 mg Intravitreal   Bevacizumab (AVASTIN) SOLN 1.25 mg Intravitreal   Bevacizumab (AVASTIN) SOLN 1.25 mg Intravitreal      REVIEW OF SYSTEMS: ROS   Positive for: HENT, Endocrine, Cardiovascular, Eyes, Respiratory, Heme/Lymph Negative for: Constitutional, Gastrointestinal, Neurological, Skin, Genitourinary, Musculoskeletal, Psychiatric, Allergic/Imm Last edited by Kingsley Spittle, COT on 03/01/2021 12:54 PM.        ALLERGIES Allergies  Allergen Reactions   Lipitor [Atorvastatin]     Elevated LFT's    PAST  MEDICAL HISTORY Past Medical History:  Diagnosis Date   Anxiety    Arthritis    Breast cancer ([place])    CAD (coronary artery disease)    a. 03/2017: 95% LCx stenosis --> PCI/DES placed   Cataract    Chest pain 03/27/2017   Chronic diastolic heart failure (HCC) 06/25/2017   Chronic lower back pain    Colon cancer ([place]) 11/2019   s/p colectomy   COPD (chronic obstructive pulmonary disease) (HCC)    Coronary artery disease    Diverticulitis    Diverticulosis    GERD (gastroesophageal reflux disease)    Glaucoma    Heart failure, type unknown ([place]) 03/11/2017   History of radiation therapy 08/05/19- 09/03/19   Right Breast/ SCV 16 fractions of 2.66 Gy each for a total of 42.56 Gy. Right breast boost 4 fractions of 2 Gy each to total 8 Gy.    Hyperlipidemia 03/11/2017   Hypertension    Hypothyroidism    Macular degeneration    wet and dry per pt's son   Personal history of chemotherapy    Personal history of radiation therapy    Pneumonia X 1   "years and years ago" (03/26/2017)   Shortness of breath 03/11/2017   Status post dilation of esophageal narrowing    Past Surgical History:  Procedure Laterality Date   BILATERAL OOPHORECTOMY Bilateral    BREAST LUMPECTOMY Right 06/19/2019   BREAST LUMPECTOMY WITH RADIOACTIVE SEED AND SENTINEL LYMPH NODE BIOPSY Right 06/19/2019   Procedure: RIGHT BREAST LUMPECTOMY WITH RADIOACTIVE SEED AND SENTINEL LYMPH NODE BIOPSY;  Surgeon: Jovita Kussmaul, MD;  Location: [facility];  Service: General;  Laterality: Right;   CATARACT EXTRACTION     CATARACT EXTRACTION W/ INTRAOCULAR LENS  IMPLANT, BILATERAL Bilateral    COLOSTOMY     Greater than 10 yrs   CORONARY ANGIOPLASTY WITH STENT PLACEMENT  03/26/2017   LAPAROSCOPIC RIGHT COLECTOMY Right 12/15/2019   Procedure: LAPAROSCOPIC ASSISTED RIGHT COLECTOMY;  Surgeon: Jovita Kussmaul, MD;  Location: [facility];  Service: General;  Laterality: Right;   LEFT HEART CATH AND CORONARY ANGIOGRAPHY N/A 03/26/2017   Procedure:  Left Heart Cath and Coronary Angiography;  Surgeon: Belva Crome, MD;  Location: [facility];  Service: Cardiovascular;  Laterality: N/A;   NERVE SURGERY     "lump on my sciatic nerve was removed years ago"   SHOULDER OPEN ROTATOR CUFF REPAIR Right    THYROIDECTOMY, PARTIAL     TONSILLECTOMY     UPPER GASTROINTESTINAL ENDOSCOPY     > ten yrs    FAMILY HISTORY Family History  Problem Relation Age of Onset   CAD Mother    Cataracts Mother    Coronary artery disease Father    Leukemia Sister    Amblyopia Neg Hx    Blindness Neg Hx    Diabetes Neg Hx  Glaucoma Neg Hx    Macular degeneration Neg Hx    Retinal detachment Neg Hx    Strabismus Neg Hx    Retinitis pigmentosa Neg Hx    Colon cancer Neg Hx    Colon polyps Neg Hx    Esophageal cancer Neg Hx    Rectal cancer Neg Hx    Stomach cancer Neg Hx     SOCIAL HISTORY Social History   Tobacco Use   Smoking status: Some Days    Packs/day: 0.50    Years: 60.00    Pack years: 30.00    Types: Cigarettes   Smokeless tobacco: Never  Vaping Use   Vaping Use: Never used  Substance Use Topics   Alcohol use: Yes    Comment: twice a month   Drug use: No         OPHTHALMIC EXAM:  Base Eye Exam     Visual Acuity (Snellen - Linear)       Right Left   Dist cc CF at 3' 20/100   Dist ph cc  NI    Correction: Glasses         Tonometry (Tonopen, 1:15 PM)       Right Left   Pressure 11 14         Pupils       Dark Light Shape React APD   Right 4 4 Round  +1   Left 4 3 Round Brisk None         Visual Fields (Counting fingers)       Left Right    Full Full         Extraocular Movement       Right Left    Full, Ortho Full, Ortho         Neuro/Psych     Oriented x3: Yes   Mood/Affect: Normal         Dilation     Both eyes: 1.0% Mydriacyl, 2.5% Phenylephrine @ 1:15 PM           Slit Lamp and Fundus Exam     Slit Lamp Exam       Right Left   Lids/Lashes  Dermatochalasis - upper lid, Telangiectasia, Meibomian gland dysfunction - improving Dermatochalasis - upper lid, Telangiectasia   Conjunctiva/Sclera White and quiet 1-2+ nasal Injection   Cornea Arcus, mild Debris in tear film, 2+ Punctate epithelial erosions, 3+ fine endo pigment Arcus, 2+ diffuse Punctate epithelial erosions, endo pigment / Krunkenberg's spindle   Anterior Chamber Deep and quiet Deep and quiet   Iris Round and dilated Round and dilated   Lens Three piece PC IOL in good position, open PC Three piece PC IOL in good position, clear PC   Vitreous Vitreous syneresis, Posterior vitreous detachment, mild vitreous condensations Mild Vitreous syneresis, Posterior vitreous detachment         Fundus Exam       Right Left   Disc sharp rim, 360 Peripapillary atrophy, 2-3+Pallor 360 Peripapillary atrophy, trace temporal pallor, sharp rim   C/D Ratio 0.3 0.1   Macula Blunted foveal reflex, Drusen, RPE mottling and clumping, +PED, +CNVM, sub-retinal central Disciform scar, no heme Blunted foveal reflex, Drusen, RPE mottling, clumping, and Atrophy, focal GA nasal macula, no heme   Vessels attenuated, Tortuous Vascular attenuation   Periphery Attached, reticular degeneration, No heme  Attached, reticular degeneration           Refraction     Wearing Rx  Sphere Cylinder Axis   Right -1.00 +1.50 014   Left -1.25 +1.25 174         Manifest Refraction       Sphere Cylinder Axis Dist VA   Right       Left -2.00 +0.50 175 20/70+1            IMAGING AND PROCEDURES  Imaging and Procedures for @TODAY @  OCT, Retina - OU - Both Eyes       Right Eye Quality was good. Central Foveal Thickness: 588. Progression has been stable. Findings include subretinal hyper-reflective material, choroidal neovascular membrane, retinal drusen , epiretinal membrane, disciform scar, abnormal foveal contour, pigment epithelial detachment, no IRF, no SRF (persistent thick  PED/SRHM/disciform scar -- stable from prior).   Left Eye Quality was good. Central Foveal Thickness: 185. Progression has been stable. Findings include abnormal foveal contour, retinal drusen , no IRF, no SRF, outer retinal atrophy, pigment epithelial detachment (Significant central GA / ORA).   Notes *Images captured and stored on drive  Diagnosis / Impression:  OD: Exudative AMD with significant submacular scar/SRHM --  persistent thick PED/SRHM/disciform scar -- no IRF/SRF OS: Non-exudative AMD w/ Significant central GA / ORA  Clinical management:  See below  Abbreviations: NFP - Normal foveal profile. CME - cystoid macular edema. PED - pigment epithelial detachment. IRF - intraretinal fluid. SRF - subretinal fluid. EZ - ellipsoid zone. ERM - epiretinal membrane. ORA - outer retinal atrophy. ORT - outer retinal tubulation. SRHM - subretinal hyper-reflective material                ASSESSMENT/PLAN:    ICD-10-CM   1. 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 Intermediate stage nonexudative age-related macular degeneration of left eye  H35.3122     3. Retinal edema  H35.81 OCT, Retina - OU - Both Eyes    4. Posterior vitreous detachment of both eyes  H43.813     5. Pigmentary glaucoma of both eyes, mild stage  H40.1331     6. Pseudophakia of both eyes  Z96.1     7. Dry eyes  H04.123        1,2. 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 Kathlen Mody  - S/P IVA OD #1 (05.20.19), #2 (07.02.19), #3 (07.30.19)  - S/P IVE OD #1 (08.27.19), #2 (09.24.19), #3 (11.05.19), #4 (12.17.19), #5 (02.21.20), #6 (05.05.20), # 7 (08.05.20), #8 (9.2.20), #9 (11.10.20), #10 (02.12.21), #11 (05.12.21), #12 (08.13.21)  - VA remains CF  - OCT with stable improvment in peripapillary / nasal macula IRF and SRF; persistent thick PED/SRHM/disciform scar  centrally  - discussed findings and guarded prognosis and treatment options  - switch in therapy to Eylea approved through [facility] [employer]  - due to stable, inactive submacular scar and VA CF -- recommend holding off on injection today  - pt in agreement  - Eylea informed consent form signed and scanned on 11.10.2020  - Eylea paperwork and benefits investigation started on 07.30.19 -- Approved for 2021 [employer]  - f/u in 4 months -- DFE/OCT  3. Age related macular degeneration, non-exudative, left eye  - mild progression of geographic atrophy on exam and OCT today  - BCVA 20/100 from 20/80  - The incidence, anatomy, and pathology of dry AMD, risk of progression, and the AREDS and AREDS 2 study including smoking risks  discussed with patient.  - continue amsler grid monitoring  4. PVD / vitreous syneresis OU  Discussed findings and prognosis  No RT or RD on 360 peripheral exam  Reviewed s/s of RT/RD  Strict return precautions for any such RT/RD signs/symptoms  5. Pigmentary glaucoma OU-   - using latanoprost OU qhs / brimonidine BID OU  - IOP good today (11,14)  - under the expert care of Dr. Quentin Ore  - monitor  6. Pseudophakia OU  - s/p CE/IOL OU  - beautiful surgery, doing well  - monitor  7. Dry eyes OU  - recommend artificial tears and lubricating ointment as needed  Ophthalmic Meds Ordered this visit:  No orders of the defined types were placed in this encounter.     Return for f/u 4-6 months, exu ARMD OD, DFE, OCT.  There are no Patient Instructions on file for this visit.  This document serves as a record of services personally performed by Gardiner Sleeper, MD, PhD. It was created on their behalf by Estill Bakes, COT an ophthalmic technician. The creation of this record is the provider's dictation and/or activities during the visit.    Electronically signed by: Estill Bakes, COT 7.12.22 @ 3:05 PM   This document serves as a record of services personally  performed by Gardiner Sleeper, MD, PhD. It was created on their behalf by San Jetty. Owens Shark, OA an ophthalmic technician. The creation of this record is the provider's dictation and/or activities during the visit.    Electronically signed by: San Jetty. Owens Shark, [place] 07.12.2022 3:05 PM   Gardiner Sleeper, M.D., Ph.D. Diseases & Surgery of the Retina and Vitreous [facility]  I have reviewed the above documentation for accuracy and completeness, and I agree with the above. Gardiner Sleeper, M.D., Ph.D. 03/03/21 3:07 PM   Abbreviations: M myopia (nearsighted); A astigmatism; H hyperopia (farsighted); P presbyopia; Mrx spectacle prescription;  CTL contact lenses; OD right eye; OS left eye; OU both eyes  XT exotropia; ET esotropia; PEK punctate epithelial keratitis; PEE punctate epithelial erosions; DES dry eye syndrome; MGD meibomian gland dysfunction; ATs artificial tears; PFAT's preservative free artificial tears; [place] nuclear sclerotic cataract; PSC posterior subcapsular cataract; ERM epi-retinal membrane; PVD posterior vitreous detachment; RD retinal detachment; DM diabetes mellitus; DR diabetic retinopathy; NPDR non-proliferative diabetic retinopathy; PDR proliferative diabetic retinopathy; CSME clinically significant macular edema; DME diabetic macular edema; dbh dot blot hemorrhages; CWS cotton wool spot; POAG primary open angle glaucoma; C/D cup-to-disc ratio; HVF humphrey visual field; GVF goldmann visual field; OCT optical coherence tomography; IOP intraocular pressure; BRVO Branch retinal vein occlusion; CRVO central retinal vein occlusion; CRAO central retinal artery occlusion; BRAO branch retinal artery occlusion; RT retinal tear; SB scleral buckle; PPV pars plana vitrectomy; VH Vitreous hemorrhage; PRP panretinal laser photocoagulation; IVK intravitreal kenalog; VMT vitreomacular traction; MH Macular hole;  NVD neovascularization of the disc; NVE neovascularization elsewhere;  AREDS age related eye disease study; ARMD age related macular degeneration; POAG primary open angle glaucoma; EBMD epithelial/anterior basement membrane dystrophy; ACIOL anterior chamber intraocular lens; IOL intraocular lens; PCIOL posterior chamber intraocular lens; Phaco/IOL phacoemulsification with intraocular lens placement; [place] photorefractive keratectomy; LASIK laser assisted in situ keratomileusis; HTN hypertension; DM diabetes mellitus; COPD chronic obstructive pulmonary disease

## 2021-03-03 ENCOUNTER — Encounter (INDEPENDENT_AMBULATORY_CARE_PROVIDER_SITE_OTHER): Payer: Self-pay | Admitting: Ophthalmology

## 2021-03-07 DIAGNOSIS — I10 Essential (primary) hypertension: Secondary | ICD-10-CM | POA: Diagnosis not present

## 2021-03-07 DIAGNOSIS — N3281 Overactive bladder: Secondary | ICD-10-CM | POA: Diagnosis not present

## 2021-03-07 DIAGNOSIS — J449 Chronic obstructive pulmonary disease, unspecified: Secondary | ICD-10-CM | POA: Diagnosis not present

## 2021-03-07 DIAGNOSIS — Z85038 Personal history of other malignant neoplasm of large intestine: Secondary | ICD-10-CM | POA: Diagnosis not present

## 2021-03-09 DIAGNOSIS — Z006 Encounter for examination for normal comparison and control in clinical research program: Secondary | ICD-10-CM

## 2021-03-09 NOTE — Research (Signed)
[employer] Research Study  [place] study 3 year telephone follow up completed. Patient denies any Serious adverse events, she is asymptomatic with cardiac symptoms. [facility] research follow up will be 1 year from now. Medications updated.

## 2021-04-05 ENCOUNTER — Other Ambulatory Visit: Payer: Self-pay

## 2021-04-05 ENCOUNTER — Ambulatory Visit
Admission: RE | Admit: 2021-04-05 | Discharge: 2021-04-05 | Disposition: A | Discharge status: Home / Self Care | Payer: Medicare Other | Source: Ambulatory Visit | Attending: Hematology | Admitting: Hematology

## 2021-04-05 DIAGNOSIS — Z170 Estrogen receptor positive status [ER+]: Secondary | ICD-10-CM

## 2021-04-05 DIAGNOSIS — R922 Inconclusive mammogram: Secondary | ICD-10-CM | POA: Diagnosis not present

## 2021-04-19 IMAGING — CT CT ABD-PELV W/O CM
2 of 4 series · 13 of 46 positions shown, 15 images · non-contrast
Comparison: January 23, 2008.

CLINICAL DATA: Generalized abdominal pain after colectomy 2 weeks
ago.

EXAM:
CT ABDOMEN AND PELVIS WITHOUT CONTRAST
TECHNIQUE: Multidetector CT imaging of the abdomen and pelvis was performed
following the standard protocol without IV contrast.

[Series 2: routine abdomen pelvis without 5.00 br40 s3 axial · axial · non-contrast · 0.56mm/px · z∈[+1343,+1693]mm · 10 of 84 slices shown, 12 images]
[im 7/84  soft-tissue]
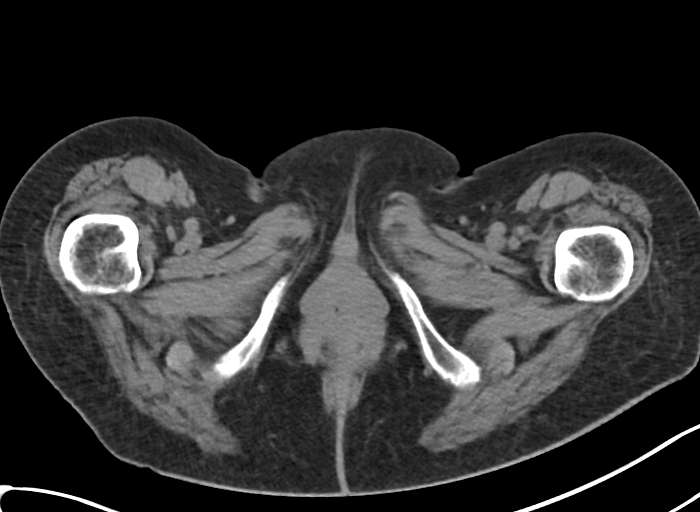
[im 7/84  bone]
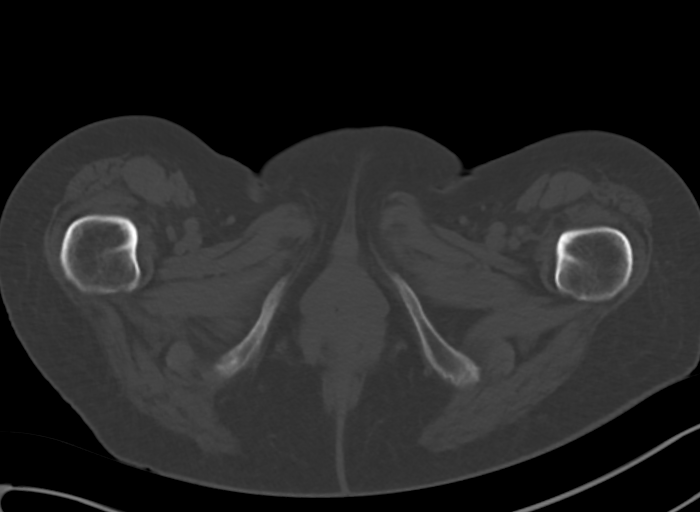
[im 14/84  soft-tissue]
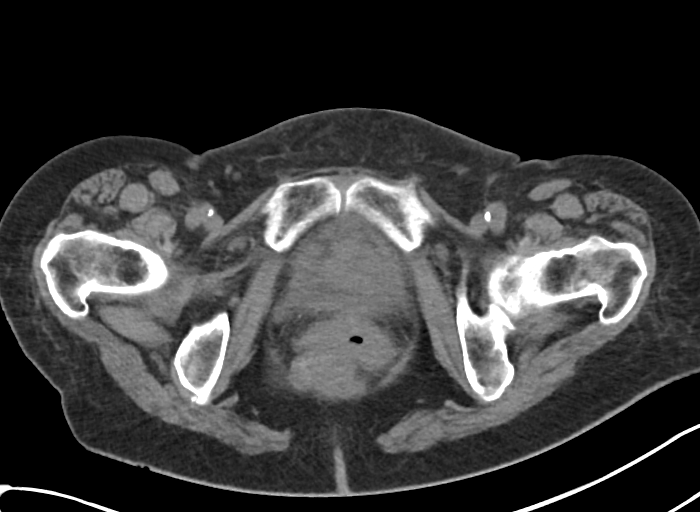
[im 24/84  soft-tissue]
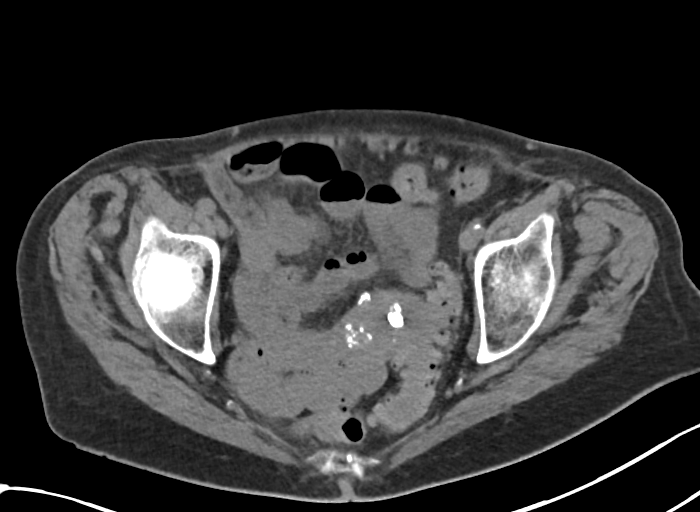
[im 30/84  soft-tissue]
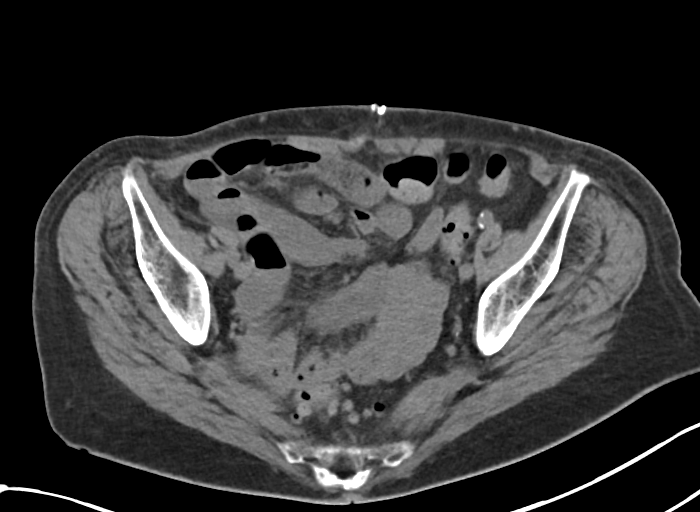
[im 37/84  soft-tissue]
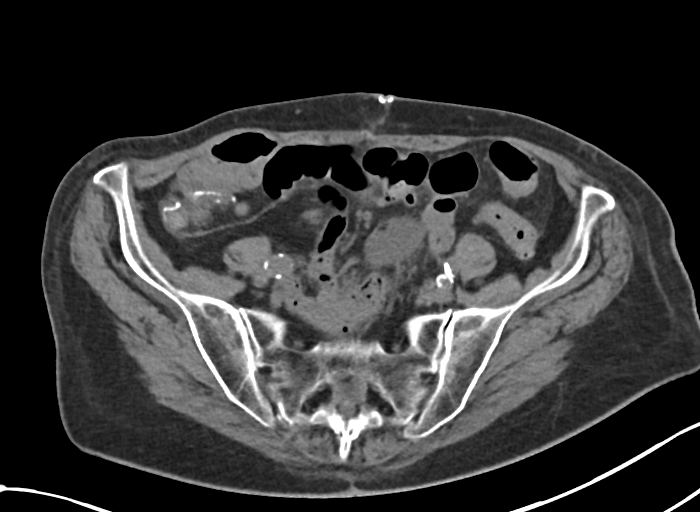
[im 47/84  soft-tissue]
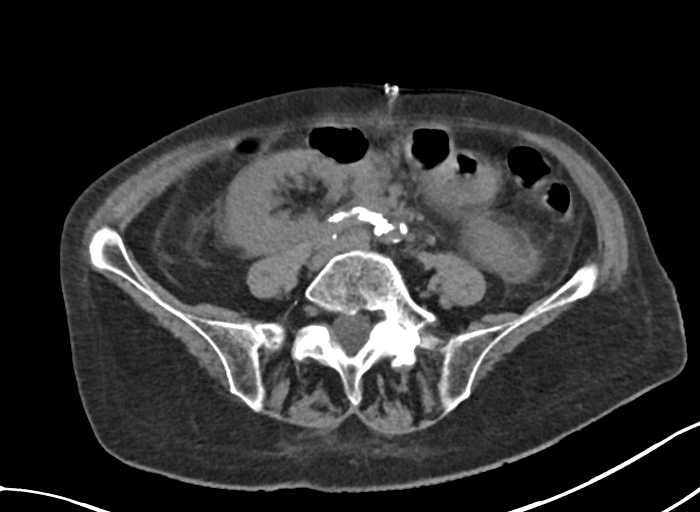
[im 54/84  soft-tissue]
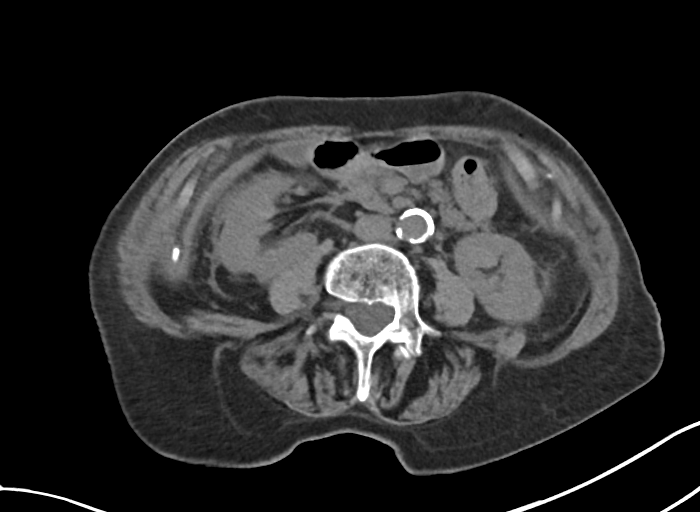
[im 64/84  soft-tissue]
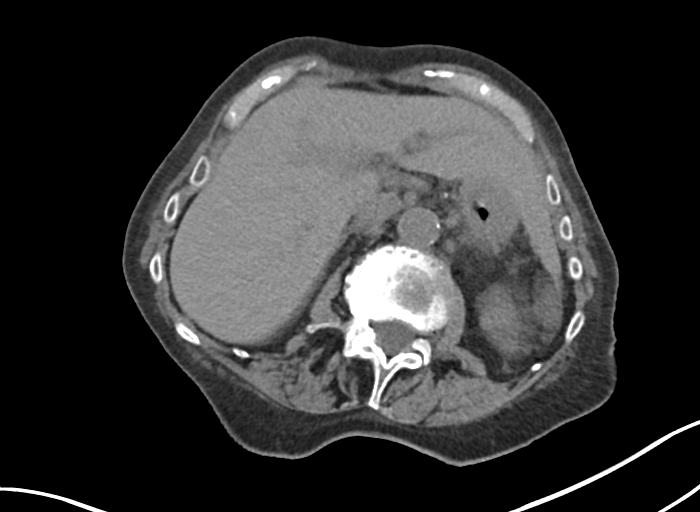
[im 70/84  soft-tissue]
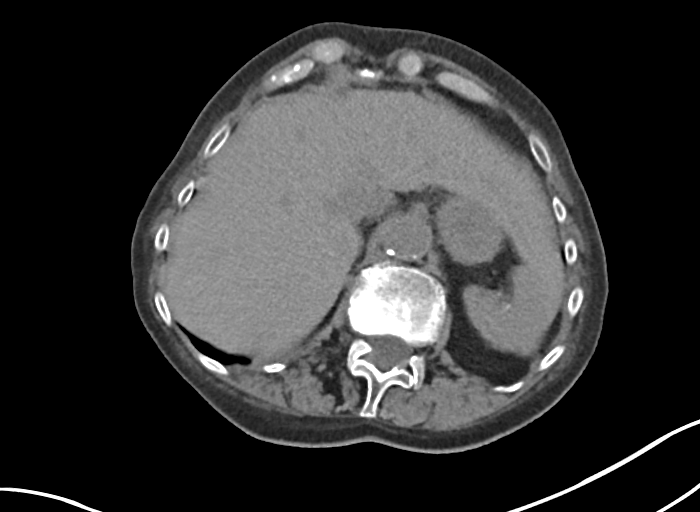
[im 70/84  bone]
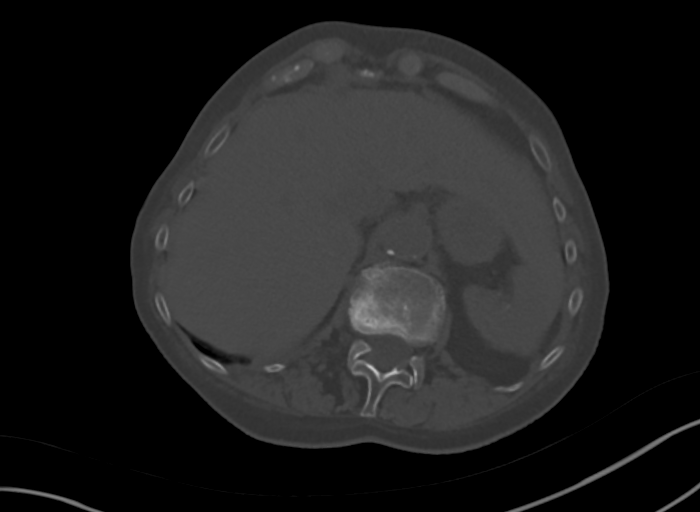
[im 77/84  soft-tissue]
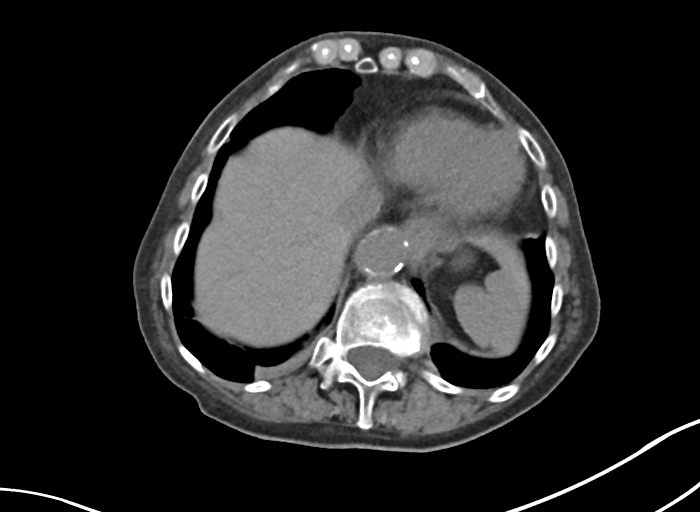

[Series 4: routine abdomen pelvis without 2.00 br40 s3 cor · coronal · non-contrast · 0.77mm/px · 3 of 144 slices shown]
[im 48/144  soft-tissue]
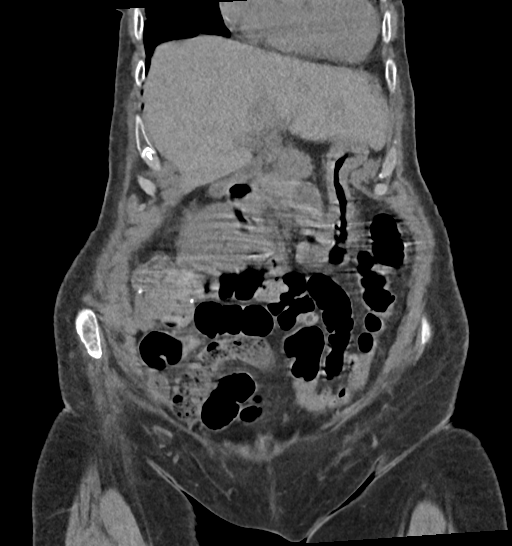
[im 64/144  soft-tissue]
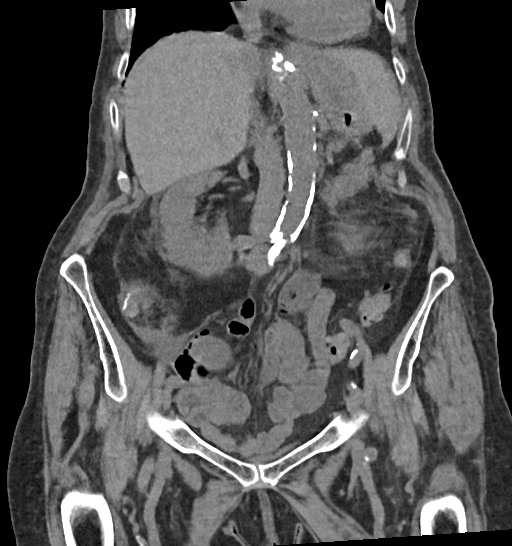
[im 80/144  soft-tissue]
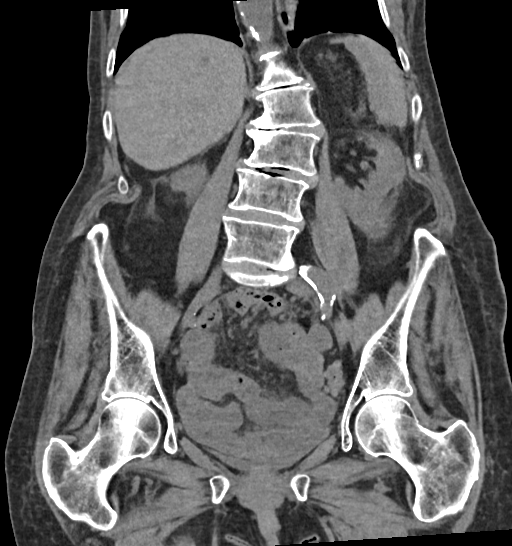

[13 of 46 positions shown; findings below may reference images not displayed]

FINDINGS: Lower chest: No acute abnormality.

Hepatobiliary: No focal liver abnormality is seen. No gallstones,
gallbladder wall thickening, or biliary dilatation.

Pancreas: Unremarkable. No pancreatic ductal dilatation or
surrounding inflammatory changes.

Spleen: Normal in size without focal abnormality.

Adrenals/Urinary Tract: Adrenal glands are unremarkable. Kidneys are
normal, without renal calculi, focal lesion, or hydronephrosis.
Bladder is unremarkable.

Stomach/Bowel: The stomach appears normal. Status post right
colectomy. There is no evidence of bowel obstruction or
inflammation. Sigmoid diverticulosis is noted without inflammation.

Vascular/Lymphatic: Aortic atherosclerosis. No enlarged abdominal or
pelvic lymph nodes.

Reproductive: Multiple small calcified uterine fibroids are noted.
No adnexal abnormality is noted.

Other: No abdominal wall hernia or abnormality. No abdominopelvic
ascites.

Musculoskeletal: No acute or significant osseous findings.
IMPRESSION: 1. Sigmoid diverticulosis without inflammation. Status post right
colectomy.
2. Multiple small calcified uterine fibroids.
3. No acute abnormality seen in the abdomen or pelvis.

Aortic Atherosclerosis ([id number]).

## 2021-04-28 DIAGNOSIS — H353211 Exudative age-related macular degeneration, right eye, with active choroidal neovascularization: Secondary | ICD-10-CM | POA: Diagnosis not present

## 2021-04-28 DIAGNOSIS — H401331 Pigmentary glaucoma, bilateral, mild stage: Secondary | ICD-10-CM | POA: Diagnosis not present

## 2021-04-28 DIAGNOSIS — Z961 Presence of intraocular lens: Secondary | ICD-10-CM | POA: Diagnosis not present

## 2021-04-28 DIAGNOSIS — H353122 Nonexudative age-related macular degeneration, left eye, intermediate dry stage: Secondary | ICD-10-CM | POA: Diagnosis not present

## 2021-06-06 ENCOUNTER — Other Ambulatory Visit: Payer: Medicare Other

## 2021-06-08 ENCOUNTER — Ambulatory Visit: Payer: Medicare Other | Admitting: Hematology

## 2021-06-13 ENCOUNTER — Other Ambulatory Visit: Payer: Self-pay

## 2021-06-13 ENCOUNTER — Inpatient Hospital Stay: Payer: Medicare Other | Attending: Hematology

## 2021-06-13 ENCOUNTER — Ambulatory Visit (HOSPITAL_COMMUNITY)
Admission: RE | Admit: 2021-06-13 | Discharge: 2021-06-13 | Disposition: A | Discharge status: Home / Self Care | Payer: Medicare Other | Source: Ambulatory Visit | Attending: Hematology | Admitting: Hematology

## 2021-06-13 DIAGNOSIS — C50411 Malignant neoplasm of upper-outer quadrant of right female breast: Secondary | ICD-10-CM | POA: Insufficient documentation

## 2021-06-13 DIAGNOSIS — I700 Atherosclerosis of aorta: Secondary | ICD-10-CM | POA: Diagnosis not present

## 2021-06-13 DIAGNOSIS — R918 Other nonspecific abnormal finding of lung field: Secondary | ICD-10-CM | POA: Diagnosis not present

## 2021-06-13 DIAGNOSIS — C182 Malignant neoplasm of ascending colon: Secondary | ICD-10-CM

## 2021-06-13 DIAGNOSIS — D259 Leiomyoma of uterus, unspecified: Secondary | ICD-10-CM | POA: Diagnosis not present

## 2021-06-13 DIAGNOSIS — Z170 Estrogen receptor positive status [ER+]: Secondary | ICD-10-CM | POA: Insufficient documentation

## 2021-06-13 DIAGNOSIS — J439 Emphysema, unspecified: Secondary | ICD-10-CM | POA: Diagnosis not present

## 2021-06-13 DIAGNOSIS — C189 Malignant neoplasm of colon, unspecified: Secondary | ICD-10-CM | POA: Diagnosis not present

## 2021-06-13 DIAGNOSIS — C50919 Malignant neoplasm of unspecified site of unspecified female breast: Secondary | ICD-10-CM | POA: Diagnosis not present

## 2021-06-13 LAB — CBC WITH DIFFERENTIAL (CANCER CENTER ONLY)
Abs Immature Granulocytes: 0.01 10*3/uL (ref 0.00–0.07)
Basophils Absolute: 0.1 10*3/uL (ref 0.0–0.1)
Basophils Relative: 1 %
Eosinophils Absolute: 0.1 10*3/uL (ref 0.0–0.5)
Eosinophils Relative: 1 %
HCT: 37 % (ref 36.0–46.0)
Hemoglobin: 12.8 g/dL (ref 12.0–15.0)
Immature Granulocytes: 0 %
Lymphocytes Relative: 25 %
Lymphs Abs: 1.3 10*3/uL (ref 0.7–4.0)
MCH: 31.8 pg (ref 26.0–34.0)
MCHC: 34.6 g/dL (ref 30.0–36.0)
MCV: 91.8 fL (ref 80.0–100.0)
Monocytes Absolute: 0.6 10*3/uL (ref 0.1–1.0)
Monocytes Relative: 12 %
Neutro Abs: 3 10*3/uL (ref 1.7–7.7)
Neutrophils Relative %: 61 %
Platelet Count: 202 10*3/uL (ref 150–400)
RBC: 4.03 MIL/uL (ref 3.87–5.11)
RDW: 13.2 % (ref 11.5–15.5)
WBC Count: 5 10*3/uL (ref 4.0–10.5)
nRBC: 0 % (ref 0.0–0.2)

## 2021-06-13 LAB — CMP (CANCER CENTER ONLY)
ALT: 16 U/L (ref 0–44)
AST: 27 U/L (ref 15–41)
Albumin: 3.8 g/dL (ref 3.5–5.0)
Alkaline Phosphatase: 41 U/L (ref 38–126)
Anion gap: 8 (ref 5–15)
BUN: 18 mg/dL (ref 8–23)
CO2: 24 mmol/L (ref 22–32)
Calcium: 9.1 mg/dL (ref 8.9–10.3)
Chloride: 103 mmol/L (ref 98–111)
Creatinine: 0.65 mg/dL (ref 0.44–1.00)
GFR, Estimated: >60 mL/min (ref >60)
Glucose, Bld: 93 mg/dL (ref 70–99)
Potassium: 3.8 mmol/L (ref 3.5–5.1)
Sodium: 135 mmol/L (ref 135–145)
Total Bilirubin: 0.7 mg/dL (ref 0.3–1.2)
Total Protein: 6.9 g/dL (ref 6.5–8.1)

## 2021-06-13 LAB — GLUCOSE, CAPILLARY: Glucose-Capillary: 104 mg/dL — ABNORMAL HIGH (ref 70–99)

## 2021-06-13 MED ORDER — FLUDEOXYGLUCOSE F - 18 (FDG) INJECTION
7.0000 | Freq: Once | INTRAVENOUS | Status: AC | PRN
  Administered 2021-06-13: 8.1 via INTRAVENOUS

## 2021-06-21 ENCOUNTER — Inpatient Hospital Stay: Payer: Medicare Other | Attending: Hematology | Admitting: Hematology

## 2021-06-21 ENCOUNTER — Encounter: Payer: Self-pay | Admitting: Hematology

## 2021-06-21 ENCOUNTER — Other Ambulatory Visit: Payer: Medicare Other

## 2021-06-21 ENCOUNTER — Other Ambulatory Visit: Payer: Self-pay

## 2021-06-21 VITALS — BP 146/82 | HR 78 | Temp 98.4°F | Resp 97 | Wt 150.9 lb

## 2021-06-21 DIAGNOSIS — Z170 Estrogen receptor positive status [ER+]: Secondary | ICD-10-CM | POA: Diagnosis not present

## 2021-06-21 DIAGNOSIS — R636 Underweight: Secondary | ICD-10-CM | POA: Insufficient documentation

## 2021-06-21 DIAGNOSIS — I10 Essential (primary) hypertension: Secondary | ICD-10-CM | POA: Insufficient documentation

## 2021-06-21 DIAGNOSIS — Z9049 Acquired absence of other specified parts of digestive tract: Secondary | ICD-10-CM | POA: Insufficient documentation

## 2021-06-21 DIAGNOSIS — I2510 Atherosclerotic heart disease of native coronary artery without angina pectoris: Secondary | ICD-10-CM | POA: Insufficient documentation

## 2021-06-21 DIAGNOSIS — C50411 Malignant neoplasm of upper-outer quadrant of right female breast: Secondary | ICD-10-CM | POA: Diagnosis not present

## 2021-06-21 DIAGNOSIS — H3530 Unspecified macular degeneration: Secondary | ICD-10-CM | POA: Insufficient documentation

## 2021-06-21 DIAGNOSIS — J449 Chronic obstructive pulmonary disease, unspecified: Secondary | ICD-10-CM | POA: Insufficient documentation

## 2021-06-21 DIAGNOSIS — Z79810 Long term (current) use of selective estrogen receptor modulators (SERMs): Secondary | ICD-10-CM | POA: Diagnosis not present

## 2021-06-21 DIAGNOSIS — E079 Disorder of thyroid, unspecified: Secondary | ICD-10-CM | POA: Insufficient documentation

## 2021-06-21 DIAGNOSIS — R5383 Other fatigue: Secondary | ICD-10-CM | POA: Insufficient documentation

## 2021-06-21 DIAGNOSIS — R918 Other nonspecific abnormal finding of lung field: Secondary | ICD-10-CM | POA: Insufficient documentation

## 2021-06-21 DIAGNOSIS — M810 Age-related osteoporosis without current pathological fracture: Secondary | ICD-10-CM | POA: Diagnosis not present

## 2021-06-21 DIAGNOSIS — Z79899 Other long term (current) drug therapy: Secondary | ICD-10-CM | POA: Diagnosis not present

## 2021-06-21 DIAGNOSIS — Z720 Tobacco use: Secondary | ICD-10-CM | POA: Insufficient documentation

## 2021-06-21 DIAGNOSIS — H9190 Unspecified hearing loss, unspecified ear: Secondary | ICD-10-CM | POA: Insufficient documentation

## 2021-06-21 DIAGNOSIS — C182 Malignant neoplasm of ascending colon: Secondary | ICD-10-CM

## 2021-06-21 MED ORDER — TAMOXIFEN CITRATE 20 MG PO TABS: 20.0000 mg | ORAL_TABLET | Freq: Every day | ORAL | 1 refills | Status: DC

## 2021-06-21 NOTE — Progress Notes (Signed)
[place]   [phone] [phone]   Clinic Follow up Note   Patient Care Team: [name], MD as PCP - General (Family Medicine) Skeet Latch, MD as PCP - Cardiology (Cardiology) Rockwell Germany, RN as Oncology Nurse Navigator Mauro Kaufmann, RN as Oncology Nurse Navigator Jovita Kussmaul, MD as Consulting Physician (General Surgery) [name], MD as Consulting Physician (Hematology) Eppie Gibson, MD as Attending Physician (Radiation Oncology)  Date of Service:  06/21/2021  CHIEF COMPLAINT: f/u of colon cancer and right breast cancer  CURRENT THERAPY:  Tamoxifen 21m once daily since 03/2019. Held 05/2019-07/2019 for surgery.   ASSESSMENT & PLAN:  PFeliza Caldwell is a 87y.o. female with   1. Cancer of right colon, pT2N0M0 -She was diagnosed in 09/2019 by colonoscopy with Dr Marissa Caldwell -She underwent right colectomy on 12/15/19 with Dr. TMarlou Caldwell Her path showed 4.6cm invasive moderately differentiated adenocarcinoma which invades the muscularis propria. Negative margins and LNs.  -restaging PET 06/13/21 was NED in abdomen or pelvis. -From a colon cancer standpoint, she is clinically stable. Labs reviewed from this week, CBC and CMP WNL. Physical exam unremarkable.  -Continue surveillance. She has not proceeded with 2022 Colonoscopy.  -f/u in 4 months.     2. Malignant neoplasm of upper-outer quadrant of right breast, invasive ductal carcinoma, pT1cN2aMx, ER/PR+, HER2-, Grade II -She was diagnosed in 03/2019 with invasive ductal carcinoma which spread to her local LN. -She underwent right breast lumpectomy with targeted lymph node dissection by Dr Marissa Starkson 06/19/19, adjuvant RT with Dr. SIsidore Moos12/15/20-1/13/21.  -She started Tamoxifen in 03/2019 and held 05/2019-07/2019 for surgery.  -most recent MM 04/05/21 was benign -From a breast cancer standpoint, she is stable. Breast exam unremarkable with mild tenderness at axillary incision   -Continue Tamoxifen and surveillance with yearly mammograms. Next mammogram in 03/2022.    3. COPD, history of heavy smoking, bilateral lung nodules  -On inhalers, not on home oxygen  -She still smokes occasionally. She has not completely quit.   -PET from 12/1/120 shows 181mground-glass nodules in RU lung. Persists on 04/30/20 CT Chest. Her right lung nodule is more solidified on 02/01/21 CT CAP, but overall appears to be stable to me.  -nodules appear stable on 06/13/21 PET compared to 2 years ago. I reviewed the scan images with the patient and her son. I feel we can just continue to watch it. Will repeat CT chest in a year    4. Underweight, Fatigue  -After drastic change in diet she had lowered appetite and lost significant weight in a year. She has started to gain some of that weight back.  -She is no longer on Mirtazapine. She still eats fairly.    5. Comorbidities: CAD, HTN, Thyroid dysfunction, Osteoporosis, Arthritis, Macular Degeneration of both eyes, hearing loss, Anxiety/Depression.  -She has had a cardiac stent placed in the past. She will continue to follow up with her cardiologist. -She is on Coreg, nitro, pravastatin and other meds for her heart and thyroid medication  -She has received Prolia injections q6m28monthith Dr. WhiDema Caldwell will continue Calcium and Vit D.  -On Zoloft, mood is stable. Will monitor while on Tamoxifen given mild interaction.     PLAN:  -PET scan reviewed, stable b/l lung nodules  -Continue Tamoxifen  -lab and f/u in 6 months   No problem-specific Assessment & Plan notes found for this encounter.   SUMMARY OF ONCOLOGIC HISTORY: Oncology History Overview Note  Cancer Staging  Cancer of right colon [facility]) Staging form: Colon and Rectum, AJCC 8th Edition - Pathologic stage from 12/15/2019: Stage I (pT2, pN0, cM0) - Signed by [name], MD on 01/08/2020  Malignant neoplasm of upper-outer quadrant of right breast in female, estrogen receptor positive  ([place]) Staging form: Breast, AJCC 8th Edition - Clinical: Stage IIA (cT1c, cN1, cM0, G3, ER+, PR+, HER2-) - Signed by [name], MD on 04/09/2019 - Pathologic stage from 06/19/2019: Stage IB (pT1c, pN2a, cM0, G2, ER+, PR+, HER2-) - Signed by [name], MD on 07/03/2019    Malignant neoplasm of upper-outer quadrant of right breast in female, estrogen receptor positive ([name])  03/19/2019 Mammogram   Diagnostic Mammogram 03/19/19  IMPRESSION The 2 cm distortion in the  right breast 9:30 position middle depth 3cm from the nipple is indeterminate. [place] recommended.  There is also a 1.2cmx3.2cm enlarged right axillary LN highly suggestive of malignancy.     03/31/2019 Initial Biopsy   Diagnosis 03/31/19  1. Breast, right, needle core biopsy, 9:30 o'clock, mid depth, 3cmfn - INVASIVE DUCTAL CARCINOMA. - LYMPHOVASCULAR INVASION IS IDENTIFIED. - SEE COMMENT. 2. Lymph node, needle/core biopsy, right axilla - INVASIVE DUCTAL CARCINOMA. - SEE COMMENT.   03/31/2019 Receptors her2   1. PROGNOSTIC INDICATORS Results: IMMUNOHISTOCHEMICAL AND MORPHOMETRIC ANALYSIS PERFORMED MANUALLY The tumor cells are NEGATIVE for Her2 (0). Estrogen Receptor: 100%, POSITIVE, STRONG STAINING INTENSITY Progesterone Receptor: 10%, POSITIVE, STRONG STAINING INTENSITY Proliferation Marker Ki67: 10%  2. PROGNOSTIC INDICATORS Results: IMMUNOHISTOCHEMICAL AND MORPHOMETRIC ANALYSIS PERFORMED MANUALLY Estrogen Receptor: 100%, POSITIVE, STRONG STAINING INTENSITY Progesterone Receptor: 0%, NEGATIVE Proliferation Marker Ki67: 40%   04/03/2019 Initial Diagnosis   Malignant neoplasm of upper-outer quadrant of right breast in female, estrogen receptor positive ([employer])   04/09/2019 Cancer Staging   Staging form: Breast, AJCC 8th Edition - Clinical: Stage IIA (cT1c, cN1, cM0, G3, ER+, PR+, HER2-) - Signed by [name], MD on 04/09/2019  Staging form: Breast, AJCC 8th Edition - Clinical: Stage IIA (cT1c, cN1, cM0, G3, ER+, PR+, HER2-)  - Signed by [name], MD on 04/09/2019    03/2019 -  Anti-estrogen oral therapy   Tamoxifen 30m daily starting 03/2019. Held 05/30/19 -07/2019 for surgery.   06/19/2019 Surgery   RIGHT BREAST LUMPECTOMY WITH RADIOACTIVE SEED AND SENTINEL LYMPH NODE BIOPSY by Dr Marissa Caldwell 06/19/19    06/19/2019 Pathology Results   FINAL MICROSCOPIC DIAGNOSIS: 06/19/19  A. BREAST, RIGHT, LUMPECTOMY:  -  Mucinous carcinoma, Nottingham grade 2  -  Margins uninvolved by carcinoma (0.2 cm; medial margin)  -  Lymphovascular space invasion present  -  Previous biopsy site changes present  -  See oncology table and comment below   B. LYMPH NODE, RIGHT AXILLARY #1, SENTINEL, BIOPSY:  -  Metastatic carcinoma involving two lymph nodes (2/2)  -  Extracapsular extension present  -  See comment   C. BREAST, RIGHT MEDIAL MARGIN, EXCISION:  -  Residual invasive carcinoma  -  Usual ductal hyperplasia with calcifications  -  See comment   D. LYMPH NODE, RIGHT AXILLARY #2, SENTINEL, BIOPSY:  -  Metastatic carcinoma involving one lymph node (1/1)   E. LYMPH NODE, RIGHT AXILLARY #1 , BIOPSY:  -  Metastatic carcinoma involving one lymph node (1/1)   F. LYMPH NODE, RIGHT AXILLARY #2, BIOPSY:  -  No carcinoma identified in one lymph node (0/1)    06/19/2019 Cancer Staging   Staging form: Breast, AJCC 8th Edition - Pathologic stage from 06/19/2019: Stage IB (pT1c, pN2a, cM0, G2,  ER+, PR+, HER2-) - Signed by [name], MD on 07/03/2019    07/22/2019 PET scan   IMPRESSION: 1. Low level hypermetabolism identified in the surgical beds of the right breast and right axilla. This is presumably related to the surgery. 2. Tiny right subpectoral lymph nodes are asymmetric in concerning for metastatic disease on CT imaging. No hypermetabolism on the PET study although size of these lymph nodes is below reliable resolution on PET imaging. 3. 10 mm short axis precarinal lymph node shows low level hypermetabolism. Metastatic  involvement not excluded. 4. 11 mm ground-glass nodule in right upper lobe shows low level FDG accumulation. Well differentiated or low-grade neoplasm is a distinct concern. Close follow-up recommended. 5. Focal hypermetabolism in the right colon, potentially related to the ileocecal valve but incompletely characterized. Adenoma or carcinoma could have this appearance. Correlation with colorectal cancer screening history recommended. 6. Focal hypermetabolism identified in the region of the left pubic bone and adjacent sigmoid colon. No underlying bony or colonic lesion evident. Metastatic involvement of the left pubic bone not excluded. 7.  Aortic Atherosclerois (ICD10-170.0)     08/05/2019 - 09/03/2019 Radiation Therapy   Adjuvant RT with Dr Isidore Caldwell 08/05/19-09/03/19.    06/13/2021 PET scan   IMPRESSION: 1. Sub solid right upper lobe pulmonary nodule shows low level hypermetabolism. Neoplasm not excluded. 2. 11 mm ground-glass nodule superior segment left lower lobe shows very low level FDG accumulation. Findings may be infectious/inflammatory. As well differentiated in low-grade neoplasm can be poorly FDG avid, continued close attention recommended. 3. No evidence for hypermetabolic metastatic disease in the abdomen or pelvis. 4.  Aortic Atherosclerois (ICD10-170.0) 5.  Emphysema. (YFV49-S49.9)   Cancer of right colon ([place])  10/15/2019 Procedure   Colonoscopy by Dr Silverio Caldwell  IMPRESSION - Likely malignant tumor in the proximal ascending colon. Biopsied. Tattooed. - Severe diverticulosis in the sigmoid colon, in the descending colon, in the transverse colon and in the ascending colon. - Non-bleeding internal hemorrhoids.   10/15/2019 Initial Biopsy   Diagnosis Ascending Colon Polyp, mass - ADENOCARCINOMA. Microscopic Comment IHC for MMR will be reported separately. Results reported to Dr. Harl Bowie on 10/16/2019. Intradepartmental consultation (Dr. Vic Ripper).    12/15/2019 Initial Diagnosis   Cancer of right colon ([place])   12/15/2019 Cancer Staging   Staging form: Colon and Rectum, AJCC 8th Edition - Pathologic stage from 12/15/2019: Stage I (pT2, pN0, cM0) - Signed by [name], MD on 01/08/2020    12/15/2019 Surgery   LAPAROSCOPIC ASSISTED RIGHT COLECTOMY by Dr Marlou Caldwell and Dr Marcello Moores   12/15/2019 Pathology Results   FINAL MICROSCOPIC DIAGNOSIS:   A. COLON, TERMINAL ILEUM AND RIGHT, COLECTOMY:  - Invasive moderately differentiated adenocarcinoma, 4.6 cm, involving  ascending colon  - Carcinoma invades into deeper muscularis propria but not beyond  - Resection margins are negative for carcinoma  - Negative for lymphovascular or perineural invasion  - Unremarkable appendix  - Nineteen lymph nodes, negative for carcinoma (0/19)  - See oncology table    12/15/2019 Genetic Testing   ADDENDUM:   Mismatch Repair Protein (IHC)  SUMMARY INTERPRETATION: ABNORMAL  There is loss of the major and minor MMR proteins MLH1 and PMS2. The  loss of expression may be secondary to promoter hyper-methylation, gene  mutation or other genetic event. BRAF mutation testing and/or MLH1  methylation testing is indicated. The presence of a BRAF mutation and/or  MLH1 hypermethylation is indicative of a sporadic-type tumor. The  absence of either BRAF mutation and/or presence of normal  methylation  indicate the possible presence of a hereditary germline mutation (e.g.  Lynch syndrome) and referral to genetic counseling is warranted. It is  recommended that the loss of protein expression be correlated with  molecular based MSI testing.   IHC EXPRESSION RESULTS  TEST           RESULT  MLH1:          LOSS OF NUCLEAR EXPRESSION  MSH2:          Preserved nuclear expression  MSH6:          Preserved nuclear expression  PMS2:          LOSS OF NUCLEAR EXPRESSION    12/29/2019 Imaging   CT AP W contrast  IMPRESSION: 1. Sigmoid diverticulosis without inflammation. Status  post right colectomy. 2. Multiple small calcified uterine fibroids. 3. No acute abnormality seen in the abdomen or pelvis.   Aortic Atherosclerosis (ICD10-I70.0).     01/28/2020 Imaging   CT AP w contrast  IMPRESSION: 1. Moderate irregular wall thickening of the right colon with some areas of low attenuation. Findings could be due to cecal diverticulitis or other inflammatory or infectious colitis. I doubt that this represents recurrent colon cancer. 2. Status post partial right colectomy. No findings for locoregional adenopathy or metastatic disease. 3. Stable advanced atherosclerotic calcifications involving the aorta and branch vessels. 4. Stable benign-appearing central hepatic cysts. 5. Stable calcified uterine fibroids. 6. Aortic atherosclerosis.   Aortic Atherosclerosis (ICD10-I70.0).   04/30/2020 Imaging   CT Chest  IMPRESSION: 1. Ground-glass nodule in the RIGHT upper lobe measuring 1.4 x 0.9 cm previously 1.4 x 0.9 cm. 2. Ground-glass nodule in the LEFT upper lobe measuring 11 x 12 mm, spiculated margins and mild fissural distortion, accompanying features of this nodule. Findings are concerning for for multifocal bronchogenic neoplasm given persistence. 3. Stable size of small nodes along the RIGHT paratracheal chain largest approximately 9 mm. 4. Stable hepatic hemangioma. 5. Dilation of the ascending aorta to 3.7 cm, similar to previous exams. 6. Three-vessel coronary artery disease. 7. Aortic atherosclerosis.   Aortic Atherosclerosis (ICD10-I70.0).     02/01/2021 Imaging   CT CAP  IMPRESSION: No evidence of recurrent or metastatic carcinoma within the abdomen or pelvis. No evidence of thoracic metastatic disease.   Stable hepatic hemangioma.   Stable small calcified uterine fibroids.   Colonic diverticulosis. No radiographic evidence of diverticulitis.   Sub-solid pulmonary nodule in anterior right upper lobe shows increased solid component,  suspicious for primary lung adenocarcinoma. PET-CT is recommended for further evaluation.   Stable 11 mm ground-glass nodule in superior segment of left lower lobe. Recommend continued attention on follow-up imaging, and this could also be assessed by PET-CT.   Aortic Atherosclerosis (ICD10-I70.0) and Emphysema (ICD10-J43.9).     06/13/2021 PET scan   IMPRESSION: 1. Sub solid right upper lobe pulmonary nodule shows low level hypermetabolism. Neoplasm not excluded. 2. 11 mm ground-glass nodule superior segment left lower lobe shows very low level FDG accumulation. Findings may be infectious/inflammatory. As well differentiated in low-grade neoplasm can be poorly FDG avid, continued close attention recommended. 3. No evidence for hypermetabolic metastatic disease in the abdomen or pelvis. 4.  Aortic Atherosclerois (ICD10-170.0) 5.  Emphysema. (FXT02-I09.9)      INTERVAL HISTORY:  Marissa Caldwell is here for a follow up of breast and colon cancers. She was last seen by me on 02/04/21. She presents to the clinic accompanied by her son. She reports doing well overall. She  notes her only pain is her arthritis, including to her tailbone. She denies any issues from the tamoxifen.   All other systems were reviewed with the patient and are negative.  MEDICAL HISTORY:  Past Medical History:  Diagnosis Date   Anxiety    Arthritis    Breast cancer ([place])    CAD (coronary artery disease)    a. 03/2017: 95% LCx stenosis --> PCI/DES placed   Cataract    Chest pain 03/27/2017   Chronic diastolic heart failure ([place]) 06/25/2017   Chronic lower back pain    Colon cancer ([place]) 11/2019   s/p colectomy   COPD (chronic obstructive pulmonary disease) ([place])    Coronary artery disease    Diverticulitis    Diverticulosis    GERD (gastroesophageal reflux disease)    Glaucoma    Heart failure, type unknown ([place]) 03/11/2017   History of radiation therapy 08/05/19- 09/03/19   Right Breast/ SCV 16  fractions of 2.66 Gy each for a total of 42.56 Gy. Right breast boost 4 fractions of 2 Gy each to total 8 Gy.    Hyperlipidemia 03/11/2017   Hypertension    Hypothyroidism    Macular degeneration    wet and dry per pt's son   Personal history of chemotherapy    Personal history of radiation therapy    Pneumonia X 1   "years and years ago" (03/26/2017)   Shortness of breath 03/11/2017   Status post dilation of esophageal narrowing     SURGICAL HISTORY: Past Surgical History:  Procedure Laterality Date   BILATERAL OOPHORECTOMY Bilateral    BREAST LUMPECTOMY Right 06/19/2019   BREAST LUMPECTOMY WITH RADIOACTIVE SEED AND SENTINEL LYMPH NODE BIOPSY Right 06/19/2019   Procedure: RIGHT BREAST LUMPECTOMY WITH RADIOACTIVE SEED AND SENTINEL LYMPH NODE BIOPSY;  Surgeon: Jovita Kussmaul, MD;  Location: [facility];  Service: General;  Laterality: Right;   CATARACT EXTRACTION     CATARACT EXTRACTION W/ INTRAOCULAR LENS  IMPLANT, BILATERAL Bilateral    COLOSTOMY     Greater than 10 yrs   CORONARY ANGIOPLASTY WITH STENT PLACEMENT  03/26/2017   LAPAROSCOPIC RIGHT COLECTOMY Right 12/15/2019   Procedure: LAPAROSCOPIC ASSISTED RIGHT COLECTOMY;  Surgeon: Jovita Kussmaul, MD;  Location: [facility];  Service: General;  Laterality: Right;   LEFT HEART CATH AND CORONARY ANGIOGRAPHY N/A 03/26/2017   Procedure: Left Heart Cath and Coronary Angiography;  Surgeon: Belva Crome, MD;  Location: [facility];  Service: Cardiovascular;  Laterality: N/A;   NERVE SURGERY     "lump on my sciatic nerve was removed years ago"   SHOULDER OPEN ROTATOR CUFF REPAIR Right    THYROIDECTOMY, PARTIAL     TONSILLECTOMY     UPPER GASTROINTESTINAL ENDOSCOPY     > ten yrs    I have reviewed the social history and family history with the patient and they are unchanged from previous note.  ALLERGIES:  is allergic to lipitor [atorvastatin].  MEDICATIONS:  Current Outpatient Medications  Medication Sig Dispense Refill   albuterol  (PROVENTIL HFA;VENTOLIN HFA) 108 (90 Base) MCG/ACT inhaler Inhale 2 puffs into the lungs every 6 (six) hours as needed (for shortness of breath/wheezing.).      aspirin EC 81 MG tablet Take 81 mg by mouth daily.     brimonidine (ALPHAGAN) 0.2 % ophthalmic solution Place 1 drop into the left eye 2 (two) times daily.      budesonide-formoterol (SYMBICORT) 160-4.5 MCG/ACT inhaler Inhale 2 puffs into the lungs 2 (  two) times daily.     Calcium Carb-Cholecalciferol (CALCIUM + D3) 600-200 MG-UNIT TABS Take 1 tablet by mouth daily.     cephALEXin (KEFLEX) 500 MG capsule Take 1 capsule every 6 hours for 7 days 28 capsule 0   Cholecalciferol (VITAMIN D3) 2000 units capsule Take 4,000 Units by mouth daily.     Coenzyme Q10 (COQ10) 100 MG CAPS Take 100 mg by mouth daily.     latanoprost (XALATAN) 0.005 % ophthalmic solution Place 1 drop into both eyes at bedtime.     levothyroxine (SYNTHROID, LEVOTHROID) 100 MCG tablet Take 100 mcg by mouth daily before breakfast.   0   LORazepam (ATIVAN) 0.5 MG tablet Take 0.5 mg by mouth 2 (two) times daily.      LUTEIN PO Take 1 tablet by mouth daily.     mirtazapine (REMERON) 15 MG tablet Take 15 mg by mouth at bedtime.     Multiple Vitamin (MULTIVITAMIN) tablet Take 1 tablet by mouth daily.     nitroGLYCERIN (NITROSTAT) 0.4 MG SL tablet Place 1 tablet (0.4 mg total) under the tongue every 5 (five) minutes as needed for chest pain. 25 tablet 3   Omega-3 Fatty Acids (FISH OIL PO) Take 1 capsule by mouth daily.     pravastatin (PRAVACHOL) 40 MG tablet TAKE 1 TABLET BY MOUTH DAILY GENERIC EQUIVALENT FOR PRAVACHOL 90 tablet 0   tamoxifen (NOLVADEX) 20 MG tablet Take 1 tablet (20 mg total) by mouth daily. 90 tablet 1   TOVIAZ 4 MG TB24 tablet Take 4 mg by mouth daily.   1   traMADol (ULTRAM) 50 MG tablet Take 1 tablet (50 mg total) by mouth every 12 (twelve) hours as needed. 6 tablet 0   Current Facility-Administered Medications  Medication Dose Route Frequency Provider  Last Rate Last Admin   aflibercept (EYLEA) SOLN 2 mg  2 mg Intravitreal  Bernarda Caffey, MD   2 mg at 04/16/18 1424   aflibercept (EYLEA) SOLN 2 mg  2 mg Intravitreal  Bernarda Caffey, MD   2 mg at 05/14/18 1358   aflibercept (EYLEA) SOLN 2 mg  2 mg Intravitreal  Bernarda Caffey, MD   2 mg at 06/25/18 1433   aflibercept (EYLEA) SOLN 2 mg  2 mg Intravitreal  Bernarda Caffey, MD   2 mg at 08/07/18 1557   aflibercept (EYLEA) SOLN 2 mg  2 mg Intravitreal  Bernarda Caffey, MD   2 mg at 10/11/18 1522   Bevacizumab (AVASTIN) SOLN 1.25 mg  1.25 mg Intravitreal  Bernarda Caffey, MD   1.25 mg at 01/07/18 1429   Bevacizumab (AVASTIN) SOLN 1.25 mg  1.25 mg Intravitreal  Bernarda Caffey, MD   1.25 mg at 02/19/18 1356   Bevacizumab (AVASTIN) SOLN 1.25 mg  1.25 mg Intravitreal  Bernarda Caffey, MD   1.25 mg at 03/19/18 1347    PHYSICAL EXAMINATION: ECOG PERFORMANCE STATUS: 2 - Symptomatic, <50% confined to bed  Vitals:   06/21/21 1200  BP: (!) 146/82  Pulse: 78  Resp: (!) 97  Temp: 98.4 F (36.9 C)   Wt Readings from Last 3 Encounters:  06/21/21 150 lb 14.4 oz (68.4 kg)  02/04/21 146 lb 11.2 oz (66.5 kg)  08/06/20 140 lb 9.6 oz (63.8 kg)     GENERAL:alert, no distress and comfortable SKIN: skin color, texture, turgor are normal, no rashes or significant lesions EYES: normal, Conjunctiva are pink and non-injected, sclera clear  NECK: supple, thyroid normal size, non-tender, without nodularity LYMPH:  no  palpable lymphadenopathy in the cervical, axillary  LUNGS: clear to auscultation and percussion with normal breathing effort HEART: regular rate & rhythm and no murmurs and no lower extremity edema ABDOMEN:abdomen soft, non-tender and normal bowel sounds Musculoskeletal:no cyanosis of digits and no clubbing  NEURO: alert & oriented x 3 with fluent speech, no focal motor/sensory deficits BREAST: No palpable mass, nodules or adenopathy bilaterally. Breast exam benign.   LABORATORY DATA:  I have reviewed the  data as listed CBC Latest Ref Rng & Units 06/13/2021 02/01/2021 08/31/2020  WBC 4.0 - 10.5 K/uL 5.0 5.8 8.9  Hemoglobin 12.0 - 15.0 g/dL 12.8 13.2 12.6  Hematocrit 36.0 - 46.0 % 37.0 38.6 37.3  Platelets 150 - 400 K/uL 202 212 199     CMP Latest Ref Rng & Units 06/13/2021 02/01/2021 08/31/2020  Glucose 70 - 99 mg/dL 93 95 119(H)  BUN 8 - 23 mg/dL _0 Creatinine 0.44 - 1.00 mg/dL 0.65 0.76 0.75  Sodium 135 - 145 mmol/L 135 140 135  Potassium 3.5 - 5.1 mmol/L 3.8 4.1 3.8  Chloride 98 - 111 mmol/L 103 104 101  CO2 22 - 32 mmol/L _1 Calcium 8.9 - 10.3 mg/dL 9.1 9.7 9.1  Total Protein 6.5 - 8.1 g/dL 6.9 7.3 6.7  Total Bilirubin 0.3 - 1.2 mg/dL 0.7 0.5 0.5  Alkaline Phos 38 - 126 U/L 41 47 43  AST 15 - 41 U/L _2 ALT 0 - 44 U/L _3 RADIOGRAPHIC STUDIES: I have personally reviewed the radiological images as listed and agreed with the findings in the report. No results found.    No orders of the defined types were placed in this encounter.  All questions were answered. The patient knows to call the clinic with any problems, questions or concerns. No barriers to learning was detected. The total time spent in the appointment was 30 minutes.     [name], MD 06/21/2021   I, Wilburn Mylar, am acting as scribe for [name], MD.   I have reviewed the above documentation for accuracy and completeness, and I agree with the above.

## 2021-06-28 DIAGNOSIS — Z23 Encounter for immunization: Secondary | ICD-10-CM | POA: Diagnosis not present

## 2021-07-20 DIAGNOSIS — M810 Age-related osteoporosis without current pathological fracture: Secondary | ICD-10-CM | POA: Diagnosis not present

## 2021-09-05 NOTE — Progress Notes (Addendum)
[facility] Note  09/06/2021     CHIEF COMPLAINT Patient presents for Retina Follow Up    HISTORY OF PRESENT ILLNESS: Marissa Caldwell is a 88 y.o. female who presents to the clinic today for:   HPI     Retina Follow Up   Patient presents with  Wet AMD.  In right eye.  Duration of 6 months.  Since onset it is stable.  I, the attending physician,  performed the HPI with the patient and updated documentation appropriately.        Comments   6 month follow up Exu ARMD OD- Some days vision is better than others. Using Brimonidine BID OS, Latanoprost qhs OU, Genteal Gel during the night.      Last edited by Bernarda Caffey, MD on 09/06/2021  2:03 PM.    Pt states vision is better, she is using Genteal gel and Refresh for dryness   Referring physician:  [name], MD [place],  [place] [address]  HISTORICAL INFORMATION:   Selected notes from the MEDICAL RECORD NUMBER Referred by Dr. Quentin Ore for concern of exudative ARMD OD   CURRENT MEDICATIONS: Current Outpatient Medications (Ophthalmic Drugs)  Medication Sig   brimonidine (ALPHAGAN) 0.2 % ophthalmic solution Place 1 drop into the left eye 2 (two) times daily.    latanoprost (XALATAN) 0.005 % ophthalmic solution Place 1 drop into both eyes at bedtime.   Current Facility-Administered Medications (Ophthalmic Drugs)  Medication Route   aflibercept (EYLEA) SOLN 2 mg Intravitreal   aflibercept (EYLEA) SOLN 2 mg Intravitreal   aflibercept (EYLEA) SOLN 2 mg Intravitreal   aflibercept (EYLEA) SOLN 2 mg Intravitreal   aflibercept (EYLEA) SOLN 2 mg Intravitreal   Current Outpatient Medications (Other)  Medication Sig   albuterol (PROVENTIL HFA;VENTOLIN HFA) 108 (90 Base) MCG/ACT inhaler Inhale 2 puffs into the lungs every 6 (six) hours as needed (for shortness of breath/wheezing.).    aspirin EC 81 MG tablet Take 81 mg by mouth daily.   budesonide-formoterol  (SYMBICORT) 160-4.5 MCG/ACT inhaler Inhale 2 puffs into the lungs 2 (two) times daily.   Calcium Carb-Cholecalciferol (CALCIUM + D3) 600-200 MG-UNIT TABS Take 1 tablet by mouth daily.   Cholecalciferol (VITAMIN D3) 2000 units capsule Take 4,000 Units by mouth daily.   Coenzyme Q10 (COQ10) 100 MG CAPS Take 100 mg by mouth daily.   levothyroxine (SYNTHROID, LEVOTHROID) 100 MCG tablet Take 100 mcg by mouth daily before breakfast.    LORazepam (ATIVAN) 0.5 MG tablet Take 0.5 mg by mouth 2 (two) times daily.    LUTEIN PO Take 1 tablet by mouth daily.   mirtazapine (REMERON) 15 MG tablet Take 15 mg by mouth at bedtime.   Multiple Vitamin (MULTIVITAMIN) tablet Take 1 tablet by mouth daily.   nitroGLYCERIN (NITROSTAT) 0.4 MG SL tablet Place 1 tablet (0.4 mg total) under the tongue every 5 (five) minutes as needed for chest pain.   Omega-3 Fatty Acids (FISH OIL PO) Take 1 capsule by mouth daily.   pravastatin (PRAVACHOL) 40 MG tablet TAKE 1 TABLET BY MOUTH DAILY GENERIC EQUIVALENT FOR PRAVACHOL   tamoxifen (NOLVADEX) 20 MG tablet Take 1 tablet (20 mg total) by mouth daily.   TOVIAZ 4 MG TB24 tablet Take 4 mg by mouth daily.    traMADol (ULTRAM) 50 MG tablet Take 1 tablet (50 mg total) by mouth every 12 (twelve) hours as needed.   cephALEXin (KEFLEX) 500 MG capsule Take  1 capsule every 6 hours for 7 days (Patient not taking: Reported on 09/06/2021)   Current Facility-Administered Medications (Other)  Medication Route   Bevacizumab (AVASTIN) SOLN 1.25 mg Intravitreal   Bevacizumab (AVASTIN) SOLN 1.25 mg Intravitreal   Bevacizumab (AVASTIN) SOLN 1.25 mg Intravitreal   REVIEW OF SYSTEMS: ROS   Positive for: HENT, Endocrine, Cardiovascular, Eyes, Respiratory, Heme/Lymph Negative for: Constitutional, Gastrointestinal, Neurological, Skin, Genitourinary, Musculoskeletal, Psychiatric, Allergic/Imm Last edited by Leonie Douglas, COA on 09/06/2021  1:07 PM.     ALLERGIES Allergies  Allergen Reactions    Lipitor [Atorvastatin]     Elevated LFT's   PAST MEDICAL HISTORY Past Medical History:  Diagnosis Date   Anxiety    Arthritis    Breast cancer ([facility])    CAD (coronary artery disease)    a. 03/2017: 95% LCx stenosis --> PCI/DES placed   Cataract    Chest pain 03/27/2017   Chronic diastolic heart failure ([facility]) 06/25/2017   Chronic lower back pain    Colon cancer ([facility]) 11/2019   s/p colectomy   COPD (chronic obstructive pulmonary disease) (HCC)    Coronary artery disease    Diverticulitis    Diverticulosis    GERD (gastroesophageal reflux disease)    Glaucoma    Heart failure, type unknown ([facility]) 03/11/2017   History of radiation therapy 08/05/19- 09/03/19   Right Breast/ SCV 16 fractions of 2.66 Gy each for a total of 42.56 Gy. Right breast boost 4 fractions of 2 Gy each to total 8 Gy.    Hyperlipidemia 03/11/2017   Hypertension    Hypothyroidism    Macular degeneration    wet and dry per pt's son   Personal history of chemotherapy    Personal history of radiation therapy    Pneumonia X 1   "years and years ago" (03/26/2017)   Shortness of breath 03/11/2017   Status post dilation of esophageal narrowing    Past Surgical History:  Procedure Laterality Date   BILATERAL OOPHORECTOMY Bilateral    BREAST LUMPECTOMY Right 06/19/2019   BREAST LUMPECTOMY WITH RADIOACTIVE SEED AND SENTINEL LYMPH NODE BIOPSY Right 06/19/2019   Procedure: RIGHT BREAST LUMPECTOMY WITH RADIOACTIVE SEED AND SENTINEL LYMPH NODE BIOPSY;  Surgeon: Jovita Kussmaul, MD;  Location: [facility];  Service: General;  Laterality: Right;   CATARACT EXTRACTION     CATARACT EXTRACTION W/ INTRAOCULAR LENS  IMPLANT, BILATERAL Bilateral    COLOSTOMY     Greater than 10 yrs   CORONARY ANGIOPLASTY WITH STENT PLACEMENT  03/26/2017   LAPAROSCOPIC RIGHT COLECTOMY Right 12/15/2019   Procedure: LAPAROSCOPIC ASSISTED RIGHT COLECTOMY;  Surgeon: Jovita Kussmaul, MD;  Location: [facility];  Service: General;  Laterality: Right;   LEFT HEART CATH AND  CORONARY ANGIOGRAPHY N/A 03/26/2017   Procedure: Left Heart Cath and Coronary Angiography;  Surgeon: Belva Crome, MD;  Location: [facility];  Service: Cardiovascular;  Laterality: N/A;   NERVE SURGERY     "lump on my sciatic nerve was removed years ago"   SHOULDER OPEN ROTATOR CUFF REPAIR Right    THYROIDECTOMY, PARTIAL     TONSILLECTOMY     UPPER GASTROINTESTINAL ENDOSCOPY     > ten yrs    FAMILY HISTORY Family History  Problem Relation Age of Onset   CAD Mother    Cataracts Mother    Coronary artery disease Father    Leukemia Sister    Amblyopia Neg Hx    Blindness Neg Hx    Diabetes Neg Hx  Glaucoma Neg Hx    Macular degeneration Neg Hx    Retinal detachment Neg Hx    Strabismus Neg Hx    Retinitis pigmentosa Neg Hx    Colon cancer Neg Hx    Colon polyps Neg Hx    Esophageal cancer Neg Hx    Rectal cancer Neg Hx    Stomach cancer Neg Hx    SOCIAL HISTORY Social History   Tobacco Use   Smoking status: Former    Packs/day: 0.50    Years: 60.00    Pack years: 30.00    Types: Cigarettes   Smokeless tobacco: Never  Vaping Use   Vaping Use: Never used  Substance Use Topics   Alcohol use: Yes    Comment: twice a month   Drug use: No       OPHTHALMIC EXAM:  Base Eye Exam     Visual Acuity (Snellen - Linear)       Right Left   Dist cc CF 2' temporally 20/70 -2   Dist ph cc NI NI         Tonometry (Tonopen, 1:17 PM)       Right Left   Pressure 10 11         Pupils       Dark Light Shape React APD   Right 4 3 Round Brisk None   Left 4 3 Round Brisk None         Visual Fields (Counting fingers)   Poor fixation OU        Extraocular Movement       Right Left    Full Full         Neuro/Psych     Oriented x3: Yes   Mood/Affect: Normal         Dilation     Both eyes: 1.0% Mydriacyl, 2.5% Phenylephrine @ 1:18 PM           Slit Lamp and Fundus Exam     Slit Lamp Exam       Right Left   Lids/Lashes  Dermatochalasis - upper lid, Telangiectasia, Meibomian gland dysfunction - improving Dermatochalasis - upper lid, Telangiectasia   Conjunctiva/Sclera White and quiet 1-2+ nasal Injection   Cornea Arcus, mild Debris in tear film, 2+ Punctate epithelial erosions, 3+ fine endo pigment Arcus, 3+ diffuse Punctate epithelial erosions, endo pigment / Krunkenberg's spindle   Anterior Chamber Deep and quiet Deep and quiet   Iris Round and dilated Round and dilated   Lens Three piece PC IOL in good position, open PC Three piece PC IOL in good position, clear PC   Anterior Vitreous Vitreous syneresis, Posterior vitreous detachment, mild vitreous condensations Mild Vitreous syneresis, Posterior vitreous detachment         Fundus Exam       Right Left   Disc sharp rim, 360 Peripapillary atrophy, 2-3+Pallor 360 Peripapillary atrophy, trace temporal pallor, sharp rim   C/D Ratio 0.3 0.1   Macula Blunted foveal reflex, Drusen, RPE mottling, clumping and atrophy, +PED, +CNVM, sub-retinal central Disciform scar, no heme Blunted foveal reflex, Drusen, RPE mottling, clumping, and Atrophy, focal GA nasal macula, no heme   Vessels attenuated, Tortuous attenuated, mild tortuousity   Periphery Attached, reticular degeneration, No heme  Attached, reticular degeneration           Refraction     Wearing Rx       Sphere Cylinder Axis   Right -1.00 +1.50 014   Left -1.25 +  1.25 174           IMAGING AND PROCEDURES  Imaging and Procedures for @TODAY @  OCT, Retina - OU - Both Eyes       Right Eye Quality was good. Central Foveal Thickness: 368. Progression has been stable. Findings include subretinal hyper-reflective material, retinal drusen , epiretinal membrane, disciform scar, abnormal foveal contour, pigment epithelial detachment, no IRF, no SRF, outer retinal atrophy (persistent thick PED/SRHM/disciform scar -- stable from prior).   Left Eye Quality was good. Central Foveal Thickness: 184.  Progression has been stable. Findings include abnormal foveal contour, retinal drusen , no IRF, no SRF, outer retinal atrophy, pigment epithelial detachment (Significant central GA / ORA, ?progression of ORA).   Notes *Images captured and stored on drive  Diagnosis / Impression:  OD: Exudative AMD with significant submacular scar/SRHM --  persistent thick PED/SRHM/disciform scar -- no IRF/SRF OS: Non-exudative AMD w/ Significant central GA / ORA, ?progression of ORA  Clinical management:  See below  Abbreviations: NFP - Normal foveal profile. CME - cystoid macular edema. PED - pigment epithelial detachment. IRF - intraretinal fluid. SRF - subretinal fluid. EZ - ellipsoid zone. ERM - epiretinal membrane. ORA - outer retinal atrophy. ORT - outer retinal tubulation. SRHM - subretinal hyper-reflective material             ASSESSMENT/PLAN:    ICD-10-CM   1. 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 Intermediate stage nonexudative age-related macular degeneration of left eye  H35.3122     3. Posterior vitreous detachment of both eyes  H43.813     4. Pigmentary glaucoma of both eyes, mild stage  H40.1331     5. Pseudophakia of both eyes  Z96.1     6. Dry eyes  H04.123      1. 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 Kathlen Mody  - S/P IVA OD #1 (05.20.19), #2 (07.02.19), #3 (07.30.19)  - S/P IVE OD #1 (08.27.19), #2 (09.24.19), #3 (11.05.19), #4 (12.17.19), #5 (02.21.20), #6 (05.05.20), # 7 (08.05.20), #8 (9.2.20), #9 (11.10.20), #10 (02.12.21), #11 (05.12.21), #12 (08.13.21)  - VA remains CF  - OCT with stable improvment in peripapillary / nasal macula IRF and SRF; persistent thick PED/SRHM/disciform scar centrally  - discussed findings and guarded prognosis and treatment options  - switch in therapy to Eylea approved  through [facility]  - due to stable, inactive submacular scar and VA CF -- recommend holding off on injection today  - pt in agreement  - Eylea informed consent form signed and scanned on 11.10.2020  - Eylea paperwork and benefits investigation started on 07.30.19 -- Approved for 2021 [employer]  - f/u in 6 months -- DFE/OCT  2. Age related macular degeneration, non-exudative, left eye  - mild progression of geographic atrophy on exam and OCT today  - BCVA 20/70 from 20/100  - The incidence, anatomy, and pathology of dry AMD, risk of progression, and the AREDS and AREDS 2 study including smoking risks discussed with patient.  - continue amsler grid monitoring  3. PVD / vitreous syneresis OU  Discussed findings and prognosis  No RT or RD on 360 peripheral exam  Reviewed s/s of RT/RD  Strict return precautions for any such RT/RD signs/symptoms  4. Pigmentary glaucoma OU-   - using latanoprost OU qhs /  brimonidine BID OU  - IOP good today (10,11)  - monitor  5. Pseudophakia OU  - s/p CE/IOL OU  - beautiful surgery, doing well  - monitor  6. Dry eyes OU  - recommend artificial tears and lubricating ointment as needed  Ophthalmic Meds Ordered this visit:  No orders of the defined types were placed in this encounter.    Return in about 6 months (around 03/06/2022) for f/u exu ARMD OD, DFE, OCT.  There are no Patient Instructions on file for this visit.  This document serves as a record of services personally performed by Gardiner Sleeper, MD, PhD. It was created on their behalf by Estill Bakes, COT an ophthalmic technician. The creation of this record is the provider's dictation and/or activities during the visit.    Electronically signed by: Estill Bakes, COT 1.16.23 @ 2:06 PM   This document serves as a record of services personally performed by Gardiner Sleeper, MD, PhD. It was created on their behalf by San Jetty. Owens Shark, OA an ophthalmic technician. The creation of this record  is the provider's dictation and/or activities during the visit.    Electronically signed by: San Jetty. Owens Shark, [place] 01.17.2023 2:06 PM  Gardiner Sleeper, M.D., Ph.D. Diseases & Surgery of the Retina and Vitreous [facility]  I have reviewed the above documentation for accuracy and completeness, and I agree with the above. Gardiner Sleeper, M.D., Ph.D. 09/06/21 2:06 PM   Abbreviations: M myopia (nearsighted); A astigmatism; H hyperopia (farsighted); P presbyopia; Mrx spectacle prescription;  CTL contact lenses; OD right eye; OS left eye; OU both eyes  XT exotropia; ET esotropia; PEK punctate epithelial keratitis; PEE punctate epithelial erosions; DES dry eye syndrome; MGD meibomian gland dysfunction; ATs artificial tears; PFAT's preservative free artificial tears; [place] nuclear sclerotic cataract; PSC posterior subcapsular cataract; ERM epi-retinal membrane; PVD posterior vitreous detachment; RD retinal detachment; DM diabetes mellitus; DR diabetic retinopathy; NPDR non-proliferative diabetic retinopathy; PDR proliferative diabetic retinopathy; CSME clinically significant macular edema; DME diabetic macular edema; dbh dot blot hemorrhages; CWS cotton wool spot; POAG primary open angle glaucoma; C/D cup-to-disc ratio; HVF humphrey visual field; GVF goldmann visual field; OCT optical coherence tomography; IOP intraocular pressure; BRVO Branch retinal vein occlusion; CRVO central retinal vein occlusion; CRAO central retinal artery occlusion; BRAO branch retinal artery occlusion; RT retinal tear; SB scleral buckle; PPV pars plana vitrectomy; VH Vitreous hemorrhage; PRP panretinal laser photocoagulation; IVK intravitreal kenalog; VMT vitreomacular traction; MH Macular hole;  NVD neovascularization of the disc; NVE neovascularization elsewhere; AREDS age related eye disease study; ARMD age related macular degeneration; POAG primary open angle glaucoma; EBMD epithelial/anterior basement membrane  dystrophy; ACIOL anterior chamber intraocular lens; IOL intraocular lens; PCIOL posterior chamber intraocular lens; Phaco/IOL phacoemulsification with intraocular lens placement; [place] photorefractive keratectomy; LASIK laser assisted in situ keratomileusis; HTN hypertension; DM diabetes mellitus; COPD chronic obstructive pulmonary disease

## 2021-09-06 ENCOUNTER — Encounter (INDEPENDENT_AMBULATORY_CARE_PROVIDER_SITE_OTHER): Payer: Self-pay | Admitting: Ophthalmology

## 2021-09-06 ENCOUNTER — Other Ambulatory Visit: Payer: Self-pay

## 2021-09-06 ENCOUNTER — Ambulatory Visit (INDEPENDENT_AMBULATORY_CARE_PROVIDER_SITE_OTHER): Payer: Medicare Other | Admitting: Ophthalmology

## 2021-09-06 DIAGNOSIS — H401331 Pigmentary glaucoma, bilateral, mild stage: Secondary | ICD-10-CM | POA: Diagnosis not present

## 2021-09-06 DIAGNOSIS — H43813 Vitreous degeneration, bilateral: Secondary | ICD-10-CM | POA: Diagnosis not present

## 2021-09-06 DIAGNOSIS — Z961 Presence of intraocular lens: Secondary | ICD-10-CM

## 2021-09-06 DIAGNOSIS — H353213 Exudative age-related macular degeneration, right eye, with inactive scar: Secondary | ICD-10-CM | POA: Diagnosis not present

## 2021-09-06 DIAGNOSIS — H353122 Nonexudative age-related macular degeneration, left eye, intermediate dry stage: Secondary | ICD-10-CM

## 2021-09-06 DIAGNOSIS — H04123 Dry eye syndrome of bilateral lacrimal glands: Secondary | ICD-10-CM

## 2021-09-12 DIAGNOSIS — E039 Hypothyroidism, unspecified: Secondary | ICD-10-CM | POA: Diagnosis not present

## 2021-09-12 DIAGNOSIS — J449 Chronic obstructive pulmonary disease, unspecified: Secondary | ICD-10-CM | POA: Diagnosis not present

## 2021-09-12 DIAGNOSIS — E785 Hyperlipidemia, unspecified: Secondary | ICD-10-CM | POA: Diagnosis not present

## 2021-09-12 DIAGNOSIS — I10 Essential (primary) hypertension: Secondary | ICD-10-CM | POA: Diagnosis not present

## 2021-09-12 DIAGNOSIS — F419 Anxiety disorder, unspecified: Secondary | ICD-10-CM | POA: Diagnosis not present

## 2021-09-12 DIAGNOSIS — E559 Vitamin D deficiency, unspecified: Secondary | ICD-10-CM | POA: Diagnosis not present

## 2021-09-12 DIAGNOSIS — M810 Age-related osteoporosis without current pathological fracture: Secondary | ICD-10-CM | POA: Diagnosis not present

## 2021-09-29 DIAGNOSIS — R3989 Other symptoms and signs involving the genitourinary system: Secondary | ICD-10-CM | POA: Diagnosis not present

## 2021-10-19 ENCOUNTER — Other Ambulatory Visit: Payer: Self-pay

## 2021-10-19 DIAGNOSIS — C182 Malignant neoplasm of ascending colon: Secondary | ICD-10-CM

## 2021-10-19 DIAGNOSIS — Z170 Estrogen receptor positive status [ER+]: Secondary | ICD-10-CM

## 2021-10-19 DIAGNOSIS — C50411 Malignant neoplasm of upper-outer quadrant of right female breast: Secondary | ICD-10-CM

## 2021-10-20 ENCOUNTER — Encounter: Payer: Self-pay | Admitting: Hematology

## 2021-10-20 ENCOUNTER — Inpatient Hospital Stay: Payer: Medicare Other | Attending: Hematology

## 2021-10-20 ENCOUNTER — Inpatient Hospital Stay: Payer: Medicare Other | Admitting: Hematology

## 2021-10-20 ENCOUNTER — Other Ambulatory Visit: Payer: Self-pay

## 2021-10-20 VITALS — BP 126/76 | HR 77 | Temp 97.8°F | Resp 18 | Ht 70.0 in | Wt 149.2 lb

## 2021-10-20 DIAGNOSIS — H3530 Unspecified macular degeneration: Secondary | ICD-10-CM | POA: Diagnosis not present

## 2021-10-20 DIAGNOSIS — E079 Disorder of thyroid, unspecified: Secondary | ICD-10-CM | POA: Diagnosis not present

## 2021-10-20 DIAGNOSIS — Z780 Asymptomatic menopausal state: Secondary | ICD-10-CM | POA: Diagnosis not present

## 2021-10-20 DIAGNOSIS — C182 Malignant neoplasm of ascending colon: Secondary | ICD-10-CM

## 2021-10-20 DIAGNOSIS — M810 Age-related osteoporosis without current pathological fracture: Secondary | ICD-10-CM | POA: Diagnosis not present

## 2021-10-20 DIAGNOSIS — I10 Essential (primary) hypertension: Secondary | ICD-10-CM | POA: Diagnosis not present

## 2021-10-20 DIAGNOSIS — C50411 Malignant neoplasm of upper-outer quadrant of right female breast: Secondary | ICD-10-CM | POA: Insufficient documentation

## 2021-10-20 DIAGNOSIS — Z9221 Personal history of antineoplastic chemotherapy: Secondary | ICD-10-CM | POA: Diagnosis not present

## 2021-10-20 DIAGNOSIS — Z85038 Personal history of other malignant neoplasm of large intestine: Secondary | ICD-10-CM | POA: Insufficient documentation

## 2021-10-20 DIAGNOSIS — Z79810 Long term (current) use of selective estrogen receptor modulators (SERMs): Secondary | ICD-10-CM | POA: Insufficient documentation

## 2021-10-20 DIAGNOSIS — R636 Underweight: Secondary | ICD-10-CM | POA: Insufficient documentation

## 2021-10-20 DIAGNOSIS — I2510 Atherosclerotic heart disease of native coronary artery without angina pectoris: Secondary | ICD-10-CM | POA: Diagnosis not present

## 2021-10-20 DIAGNOSIS — R5383 Other fatigue: Secondary | ICD-10-CM | POA: Insufficient documentation

## 2021-10-20 DIAGNOSIS — J449 Chronic obstructive pulmonary disease, unspecified: Secondary | ICD-10-CM | POA: Insufficient documentation

## 2021-10-20 DIAGNOSIS — M545 Low back pain, unspecified: Secondary | ICD-10-CM | POA: Diagnosis not present

## 2021-10-20 DIAGNOSIS — Z170 Estrogen receptor positive status [ER+]: Secondary | ICD-10-CM | POA: Diagnosis not present

## 2021-10-20 DIAGNOSIS — M1990 Unspecified osteoarthritis, unspecified site: Secondary | ICD-10-CM | POA: Diagnosis not present

## 2021-10-20 DIAGNOSIS — R918 Other nonspecific abnormal finding of lung field: Secondary | ICD-10-CM | POA: Insufficient documentation

## 2021-10-20 DIAGNOSIS — M25552 Pain in left hip: Secondary | ICD-10-CM | POA: Insufficient documentation

## 2021-10-20 DIAGNOSIS — Z9049 Acquired absence of other specified parts of digestive tract: Secondary | ICD-10-CM | POA: Diagnosis not present

## 2021-10-20 DIAGNOSIS — R202 Paresthesia of skin: Secondary | ICD-10-CM | POA: Insufficient documentation

## 2021-10-20 DIAGNOSIS — Z923 Personal history of irradiation: Secondary | ICD-10-CM | POA: Diagnosis not present

## 2021-10-20 DIAGNOSIS — Z79899 Other long term (current) drug therapy: Secondary | ICD-10-CM | POA: Diagnosis not present

## 2021-10-20 LAB — CMP (CANCER CENTER ONLY)
ALT: 11 U/L (ref 0–44)
AST: 21 U/L (ref 15–41)
Albumin: 3.9 g/dL (ref 3.5–5.0)
Alkaline Phosphatase: 47 U/L (ref 38–126)
Anion gap: 4 — ABNORMAL LOW (ref 5–15)
BUN: 27 mg/dL — ABNORMAL HIGH (ref 8–23)
CO2: 31 mmol/L (ref 22–32)
Calcium: 9.7 mg/dL (ref 8.9–10.3)
Chloride: 105 mmol/L (ref 98–111)
Creatinine: 0.85 mg/dL (ref 0.44–1.00)
GFR, Estimated: >60 mL/min (ref >60)
Glucose, Bld: 87 mg/dL (ref 70–99)
Potassium: 4.5 mmol/L (ref 3.5–5.1)
Sodium: 140 mmol/L (ref 135–145)
Total Bilirubin: 0.4 mg/dL (ref 0.3–1.2)
Total Protein: 7.2 g/dL (ref 6.5–8.1)

## 2021-10-20 LAB — CBC WITH DIFFERENTIAL (CANCER CENTER ONLY)
Abs Immature Granulocytes: 0.02 10*3/uL (ref 0.00–0.07)
Basophils Absolute: 0.1 10*3/uL (ref 0.0–0.1)
Basophils Relative: 1 %
Eosinophils Absolute: 0.1 10*3/uL (ref 0.0–0.5)
Eosinophils Relative: 2 %
HCT: 38.3 % (ref 36.0–46.0)
Hemoglobin: 13.1 g/dL (ref 12.0–15.0)
Immature Granulocytes: 0 %
Lymphocytes Relative: 15 %
Lymphs Abs: 1.1 10*3/uL (ref 0.7–4.0)
MCH: 32.2 pg (ref 26.0–34.0)
MCHC: 34.2 g/dL (ref 30.0–36.0)
MCV: 94.1 fL (ref 80.0–100.0)
Monocytes Absolute: 0.9 10*3/uL (ref 0.1–1.0)
Monocytes Relative: 13 %
Neutro Abs: 4.9 10*3/uL (ref 1.7–7.7)
Neutrophils Relative %: 69 %
Platelet Count: 179 10*3/uL (ref 150–400)
RBC: 4.07 MIL/uL (ref 3.87–5.11)
RDW: 13.5 % (ref 11.5–15.5)
WBC Count: 7 10*3/uL (ref 4.0–10.5)
nRBC: 0 % (ref 0.0–0.2)

## 2021-10-20 MED ORDER — LORAZEPAM 0.5 MG PO TABS: 0.5000 mg | ORAL_TABLET | Freq: Once | ORAL | 0 refills | Status: AC

## 2021-10-20 MED ORDER — TRAMADOL HCL 50 MG PO TABS: 25.0000 mg | ORAL_TABLET | Freq: Three times a day (TID) | ORAL | 0 refills | Status: DC | PRN

## 2021-10-20 NOTE — Progress Notes (Signed)
[place]   [phone] [phone]   Clinic Follow up Note   Patient Care Team: [name], MD as PCP - General (Family Medicine) Skeet Latch, MD as PCP - Cardiology (Cardiology) Rockwell Germany, RN as Oncology Nurse Navigator Mauro Kaufmann, RN as Oncology Nurse Navigator Jovita Kussmaul, MD as Consulting Physician (General Surgery) [name], MD as Consulting Physician (Hematology) Eppie Gibson, MD as Attending Physician (Radiation Oncology)  Date of Service:  10/20/2021  CHIEF COMPLAINT: f/u of colon cancer and right breast cancer  CURRENT THERAPY:  Tamoxifen $RemoveBe'20mg'[id number]$  once daily since 03/2019. Held 05/2019-07/2019 for surgery.   ASSESSMENT & PLAN:  Marissa Caldwell is a 88 y.o. female with   1. Worsening low back pain -she reports worsening back and left hip pain over the last three months. She experienced left upper leg tingling and was given prednisone by her PCP; this helped the tingling but not the pain. -she reports the pain can get to 10/10; she denies taking any pain medication. I will call in tramadol for her to try; she can take up to 3x a day. -chart review shows she previously underwent lumbar spine MRI in 09/2014 showing: left-sided facet hypertrophy at L5-S1, right-sided endplate marrow changes at L2-3, and mild spondylosis. -given her history of cancers, I recommend obtaining repeat MRI to rule out bone metastasis, and other etiology of back pain, to see if she needs to see orthopedics   2. Cancer of right colon, pT2N0M0, stage I  -She was diagnosed in 09/2019 by colonoscopy with Dr Silverio Decamp. -She underwent right colectomy on 12/15/19 with Dr. Marlou Starks. 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 Negative margins and LNs.  -restaging PET 06/13/21 was NED in abdomen or pelvis. -From a colon cancer standpoint, she is clinically stable. Labs reviewed from this week,  CBC and CMP WNL. Physical exam unremarkable.  -Continue surveillance. Last colonoscopy was at diagnosis. No repeated colonoscopy due to her age  -f/u in 4 months.     3. Malignant neoplasm of upper-outer quadrant of right breast, invasive ductal carcinoma, pT1cN2aMx, ER/PR+, HER2-, Grade II -She was diagnosed in 03/2019 with invasive ductal carcinoma which spread to her local LN. -She underwent right breast lumpectomy with targeted lymph node dissection by Dr Marlou Starks on 06/19/19, adjuvant RT with Dr. Isidore Moos 08/05/19-09/03/19.  -She started Tamoxifen in 03/2019 and held 05/2019-07/2019 for surgery.  -most recent MM 04/05/21 was benign -From a breast cancer standpoint, she is stable. Breast exam unremarkable with mild tenderness at axillary incision  -Continue Tamoxifen and surveillance with yearly mammograms. Next mammogram in 03/2022.    4. COPD, history of heavy smoking, bilateral lung nodules  -On inhalers, not on home oxygen  -She still smokes occasionally. She has not completely quit.   -PET from 12/1/120 shows 68mm ground-glass nodules in RU lung. -nodules appear stable on 06/13/21 PET compared to 2 years ago. Will repeat CT chest in a year    5. Underweight, Fatigue  -After drastic change in diet she had lowered appetite and lost significant weight in a year. She has started to gain some of that weight back.  -She is no longer on Mirtazapine. She still eats fairly.    6. Comorbidities: CAD, HTN, Thyroid dysfunction, Osteoporosis, Arthritis, Macular Degeneration of both eyes, hearing loss, Anxiety/Depression.  -She has had a cardiac stent placed in the past. She will continue to follow up with her cardiologist. -She is on Coreg,  nitro, pravastatin and other meds for her heart and thyroid medication  -She has received Prolia injections q62month with Dr. WDema Severin She will continue Calcium and Vit D.  -On Zoloft, mood is stable. Will monitor while on Tamoxifen given mild interaction.      PLAN:   -Continue Tamoxifen  -I called in tramadol for her severe low back pain  -MRI lumbar spine to be done soon, I called in ativan for claustrophobia -f/u in 2-3 weeks, after MRI   No problem-specific Assessment & Plan notes found for this encounter.   SUMMARY OF ONCOLOGIC HISTORY: Oncology History Overview Note  Cancer Staging Cancer of right colon ([facility] Staging form: Colon and Rectum, AJCC 8th Edition - Pathologic stage from 12/15/2019: Stage I (pT2, pN0, cM0) - Signed by FTruitt Merle MD on 01/08/2020  Malignant neoplasm of upper-outer quadrant of right breast in female, estrogen receptor positive ([facility] Staging form: Breast, AJCC 8th Edition - Clinical: Stage IIA (cT1c, cN1, cM0, G3, ER+, PR+, HER2-) - Signed by FTruitt Merle MD on 04/09/2019 - Pathologic stage from 06/19/2019: Stage IB (pT1c, pN2a, cM0, G2, ER+, PR+, HER2-) - Signed by FTruitt Merle MD on 07/03/2019    Malignant neoplasm of upper-outer quadrant of right breast in female, estrogen receptor positive ([place]  03/19/2019 Mammogram   Diagnostic Mammogram 03/19/19  IMPRESSION The 2 cm distortion in the  right breast 9:30 position middle depth 3cm from the nipple is indeterminate. UKorearecommended.  There is also a 1.2cmx3.2cm enlarged right axillary LN highly suggestive of malignancy.     03/31/2019 Initial Biopsy   Diagnosis 03/31/19  1. Breast, right, needle core biopsy, 9:30 o'clock, mid depth, 3cmfn - INVASIVE DUCTAL CARCINOMA. - LYMPHOVASCULAR INVASION IS IDENTIFIED. - SEE COMMENT. 2. Lymph node, needle/core biopsy, right axilla - INVASIVE DUCTAL CARCINOMA. - SEE COMMENT.   03/31/2019 Receptors her2   1. PROGNOSTIC INDICATORS Results: IMMUNOHISTOCHEMICAL AND MORPHOMETRIC ANALYSIS PERFORMED MANUALLY The tumor cells are NEGATIVE for Her2 (0). Estrogen Receptor: 100%, POSITIVE, STRONG STAINING INTENSITY Progesterone Receptor: 10%, POSITIVE, STRONG STAINING INTENSITY Proliferation Marker Ki67: 10%  2. PROGNOSTIC  INDICATORS Results: IMMUNOHISTOCHEMICAL AND MORPHOMETRIC ANALYSIS PERFORMED MANUALLY Estrogen Receptor: 100%, POSITIVE, STRONG STAINING INTENSITY Progesterone Receptor: 0%, NEGATIVE Proliferation Marker Ki67: 40%   04/03/2019 Initial Diagnosis   Malignant neoplasm of upper-outer quadrant of right breast in female, estrogen receptor positive ([facility]   04/09/2019 Cancer Staging   Staging form: Breast, AJCC 8th Edition - Clinical: Stage IIA (cT1c, cN1, cM0, G3, ER+, PR+, HER2-) - Signed by FTruitt Merle MD on 04/09/2019  Staging form: Breast, AJCC 8th Edition - Clinical: Stage IIA (cT1c, cN1, cM0, G3, ER+, PR+, HER2-) - Signed by FTruitt Merle MD on 04/09/2019    03/2019 -  Anti-estrogen oral therapy   Tamoxifen 239mdaily starting 03/2019. Held 05/30/19 -07/2019 for surgery.   06/19/2019 Surgery   RIGHT BREAST LUMPECTOMY WITH RADIOACTIVE SEED AND SENTINEL LYMPH NODE BIOPSY by Dr ToMarlou Starks10/29/20    06/19/2019 Pathology Results   FINAL MICROSCOPIC DIAGNOSIS: 06/19/19  A. BREAST, RIGHT, LUMPECTOMY:  -  Mucinous carcinoma, Nottingham grade 2  -  Margins uninvolved by carcinoma (0.2 cm; medial margin)  -  Lymphovascular space invasion present  -  Previous biopsy site changes present  -  See oncology table and comment below   B. LYMPH NODE, RIGHT AXILLARY #1, SENTINEL, BIOPSY:  -  Metastatic carcinoma involving two lymph nodes (2/2)  -  Extracapsular extension present  -  See comment   C. BREAST, RIGHT  MEDIAL MARGIN, EXCISION:  -  Residual invasive carcinoma  -  Usual ductal hyperplasia with calcifications  -  See comment   D. LYMPH NODE, RIGHT AXILLARY #2, SENTINEL, BIOPSY:  -  Metastatic carcinoma involving one lymph node (1/1)   E. LYMPH NODE, RIGHT AXILLARY #1 , BIOPSY:  -  Metastatic carcinoma involving one lymph node (1/1)   F. LYMPH NODE, RIGHT AXILLARY #2, BIOPSY:  -  No carcinoma identified in one lymph node (0/1)    06/19/2019 Cancer Staging   Staging form: Breast, AJCC 8th  Edition - Pathologic stage from 06/19/2019: Stage IB (pT1c, pN2a, cM0, G2, ER+, PR+, HER2-) - Signed by [name], MD on 07/03/2019    07/22/2019 PET scan   IMPRESSION: 1. Low level hypermetabolism identified in the surgical beds of the right breast and right axilla. This is presumably related to the surgery. 2. Tiny right subpectoral lymph nodes are asymmetric in concerning for metastatic disease on CT imaging. No hypermetabolism on the PET study although size of these lymph nodes is below reliable resolution on PET imaging. 3. 10 mm short axis precarinal lymph node shows low level hypermetabolism. Metastatic involvement not excluded. 4. 11 mm ground-glass nodule in right upper lobe shows low level FDG accumulation. Well differentiated or low-grade neoplasm is a distinct concern. Close follow-up recommended. 5. Focal hypermetabolism in the right colon, potentially related to the ileocecal valve but incompletely characterized. Adenoma or carcinoma could have this appearance. Correlation with colorectal cancer screening history recommended. 6. Focal hypermetabolism identified in the region of the left pubic bone and adjacent sigmoid colon. No underlying bony or colonic lesion evident. Metastatic involvement of the left pubic bone not excluded. 7.  Aortic Atherosclerois (ICD10-170.0)     08/05/2019 - 09/03/2019 Radiation Therapy   Adjuvant RT with Dr Isidore Moos 08/05/19-09/03/19.    06/13/2021 PET scan   IMPRESSION: 1. Sub solid right upper lobe pulmonary nodule shows low level hypermetabolism. Neoplasm not excluded. 2. 11 mm ground-glass nodule superior segment left lower lobe shows very low level FDG accumulation. Findings may be infectious/inflammatory. As well differentiated in low-grade neoplasm can be poorly FDG avid, continued close attention recommended. 3. No evidence for hypermetabolic metastatic disease in the abdomen or pelvis. 4.  Aortic Atherosclerois (ICD10-170.0) 5.   Emphysema. (LNL89-Q11.9)   Cancer of right colon ([place])  10/15/2019 Procedure   Colonoscopy by Dr Silverio Decamp  IMPRESSION - Likely malignant tumor in the proximal ascending colon. Biopsied. Tattooed. - Severe diverticulosis in the sigmoid colon, in the descending colon, in the transverse colon and in the ascending colon. - Non-bleeding internal hemorrhoids.   10/15/2019 Initial Biopsy   Diagnosis Ascending Colon Polyp, mass - ADENOCARCINOMA. Microscopic Comment IHC for MMR will be reported separately. Results reported to Dr. Harl Bowie on 10/16/2019. Intradepartmental consultation (Dr. Vic Ripper).   12/15/2019 Initial Diagnosis   Cancer of right colon ([place])   12/15/2019 Cancer Staging   Staging form: Colon and Rectum, AJCC 8th Edition - Pathologic stage from 12/15/2019: Stage I (pT2, pN0, cM0) - Signed by [name], MD on 01/08/2020    12/15/2019 Surgery   LAPAROSCOPIC ASSISTED RIGHT COLECTOMY by Dr Marlou Starks and Dr Marcello Moores   12/15/2019 Pathology Results   FINAL MICROSCOPIC DIAGNOSIS:   A. COLON, TERMINAL ILEUM AND RIGHT, COLECTOMY:  - Invasive moderately differentiated adenocarcinoma, 4.6 cm, involving  ascending colon  - Carcinoma invades into deeper muscularis propria but not beyond  - Resection margins are negative for carcinoma  - Negative for lymphovascular or perineural  invasion  - Unremarkable appendix  - Nineteen lymph nodes, negative for carcinoma (0/19)  - See oncology table    12/15/2019 Genetic Testing   ADDENDUM:   Mismatch Repair Protein (IHC)  SUMMARY INTERPRETATION: ABNORMAL  There is loss of the major and minor MMR proteins MLH1 and PMS2. The  loss of expression may be secondary to promoter hyper-methylation, gene  mutation or other genetic event. BRAF mutation testing and/or MLH1  methylation testing is indicated. The presence of a BRAF mutation and/or  MLH1 hypermethylation is indicative of a sporadic-type tumor. The  absence of either BRAF mutation and/or  presence of normal methylation  indicate the possible presence of a hereditary germline mutation (e.g.  Lynch syndrome) and referral to genetic counseling is warranted. It is  recommended that the loss of protein expression be correlated with  molecular based MSI testing.   IHC EXPRESSION RESULTS  TEST           RESULT  MLH1:          LOSS OF NUCLEAR EXPRESSION  MSH2:          Preserved nuclear expression  MSH6:          Preserved nuclear expression  PMS2:          LOSS OF NUCLEAR EXPRESSION    12/29/2019 Imaging   CT AP W contrast  IMPRESSION: 1. Sigmoid diverticulosis without inflammation. Status post right colectomy. 2. Multiple small calcified uterine fibroids. 3. No acute abnormality seen in the abdomen or pelvis.   Aortic Atherosclerosis (ICD10-I70.0).     01/28/2020 Imaging   CT AP w contrast  IMPRESSION: 1. Moderate irregular wall thickening of the right colon with some areas of low attenuation. Findings could be due to cecal diverticulitis or other inflammatory or infectious colitis. I doubt that this represents recurrent colon cancer. 2. Status post partial right colectomy. No findings for locoregional adenopathy or metastatic disease. 3. Stable advanced atherosclerotic calcifications involving the aorta and branch vessels. 4. Stable benign-appearing central hepatic cysts. 5. Stable calcified uterine fibroids. 6. Aortic atherosclerosis.   Aortic Atherosclerosis (ICD10-I70.0).   04/30/2020 Imaging   CT Chest  IMPRESSION: 1. Ground-glass nodule in the RIGHT upper lobe measuring 1.4 x 0.9 cm previously 1.4 x 0.9 cm. 2. Ground-glass nodule in the LEFT upper lobe measuring 11 x 12 mm, spiculated margins and mild fissural distortion, accompanying features of this nodule. Findings are concerning for for multifocal bronchogenic neoplasm given persistence. 3. Stable size of small nodes along the RIGHT paratracheal chain largest approximately 9 mm. 4. Stable hepatic  hemangioma. 5. Dilation of the ascending aorta to 3.7 cm, similar to previous exams. 6. Three-vessel coronary artery disease. 7. Aortic atherosclerosis.   Aortic Atherosclerosis (ICD10-I70.0).     02/01/2021 Imaging   CT CAP  IMPRESSION: No evidence of recurrent or metastatic carcinoma within the abdomen or pelvis. No evidence of thoracic metastatic disease.   Stable hepatic hemangioma.   Stable small calcified uterine fibroids.   Colonic diverticulosis. No radiographic evidence of diverticulitis.   Sub-solid pulmonary nodule in anterior right upper lobe shows increased solid component, suspicious for primary lung adenocarcinoma. PET-CT is recommended for further evaluation.   Stable 11 mm ground-glass nodule in superior segment of left lower lobe. Recommend continued attention on follow-up imaging, and this could also be assessed by PET-CT.   Aortic Atherosclerosis (ICD10-I70.0) and Emphysema (ICD10-J43.9).     06/13/2021 PET scan   IMPRESSION: 1. Sub solid right upper lobe pulmonary nodule shows  low level hypermetabolism. Neoplasm not excluded. 2. 11 mm ground-glass nodule superior segment left lower lobe shows very low level FDG accumulation. Findings may be infectious/inflammatory. As well differentiated in low-grade neoplasm can be poorly FDG avid, continued close attention recommended. 3. No evidence for hypermetabolic metastatic disease in the abdomen or pelvis. 4.  Aortic Atherosclerois (ICD10-170.0) 5.  Emphysema. (QHU76-L46.9)      INTERVAL HISTORY:  Katalaya Beel Lamore is here for a follow up of colon and breast cancers. She was last seen by me on 06/21/21. She presents to the clinic accompanied by her son. She reports she is having worsening pains and experienced tingling in her left upper leg. She notes she has had back pain most of her life. They report she saw her PCP and was given prednisone. She reports this helped the leg tingling but not so much the  pain. She rates the pain at 10/10 sometimes. She denies any problems from tamoxifen.   All other systems were reviewed with the patient and are negative.  MEDICAL HISTORY:  Past Medical History:  Diagnosis Date   Anxiety    Arthritis    Breast cancer ([place])    CAD (coronary artery disease)    a. 03/2017: 95% LCx stenosis --> PCI/DES placed   Cataract    Chest pain 03/27/2017   Chronic diastolic heart failure ([place]) 06/25/2017   Chronic lower back pain    Colon cancer ([place]) 11/2019   s/p colectomy   COPD (chronic obstructive pulmonary disease) ([place])    Coronary artery disease    Diverticulitis    Diverticulosis    GERD (gastroesophageal reflux disease)    Glaucoma    Heart failure, type unknown ([place]) 03/11/2017   History of radiation therapy 08/05/19- 09/03/19   Right Breast/ SCV 16 fractions of 2.66 Gy each for a total of 42.56 Gy. Right breast boost 4 fractions of 2 Gy each to total 8 Gy.    Hyperlipidemia 03/11/2017   Hypertension    Hypothyroidism    Macular degeneration    wet and dry per pt's son   Personal history of chemotherapy    Personal history of radiation therapy    Pneumonia X 1   "years and years ago" (03/26/2017)   Shortness of breath 03/11/2017   Status post dilation of esophageal narrowing     SURGICAL HISTORY: Past Surgical History:  Procedure Laterality Date   BILATERAL OOPHORECTOMY Bilateral    BREAST LUMPECTOMY Right 06/19/2019   BREAST LUMPECTOMY WITH RADIOACTIVE SEED AND SENTINEL LYMPH NODE BIOPSY Right 06/19/2019   Procedure: RIGHT BREAST LUMPECTOMY WITH RADIOACTIVE SEED AND SENTINEL LYMPH NODE BIOPSY;  Surgeon: Jovita Kussmaul, MD;  Location: [facility];  Service: General;  Laterality: Right;   CATARACT EXTRACTION     CATARACT EXTRACTION W/ INTRAOCULAR LENS  IMPLANT, BILATERAL Bilateral    COLOSTOMY     Greater than 10 yrs   CORONARY ANGIOPLASTY WITH STENT PLACEMENT  03/26/2017   LAPAROSCOPIC RIGHT COLECTOMY Right 12/15/2019   Procedure: LAPAROSCOPIC  ASSISTED RIGHT COLECTOMY;  Surgeon: Jovita Kussmaul, MD;  Location: [facility];  Service: General;  Laterality: Right;   LEFT HEART CATH AND CORONARY ANGIOGRAPHY N/A 03/26/2017   Procedure: Left Heart Cath and Coronary Angiography;  Surgeon: Belva Crome, MD;  Location: [facility];  Service: Cardiovascular;  Laterality: N/A;   NERVE SURGERY     "lump on my sciatic nerve was removed years ago"   SHOULDER OPEN ROTATOR CUFF REPAIR Right  THYROIDECTOMY, PARTIAL     TONSILLECTOMY     UPPER GASTROINTESTINAL ENDOSCOPY     > ten yrs    I have reviewed the social history and family history with the patient and they are unchanged from previous note.  ALLERGIES:  is allergic to lipitor [atorvastatin].  MEDICATIONS:  Current Outpatient Medications  Medication Sig Dispense Refill   albuterol (PROVENTIL HFA;VENTOLIN HFA) 108 (90 Base) MCG/ACT inhaler Inhale 2 puffs into the lungs every 6 (six) hours as needed (for shortness of breath/wheezing.).      aspirin EC 81 MG tablet Take 81 mg by mouth daily.     brimonidine (ALPHAGAN) 0.2 % ophthalmic solution Place 1 drop into the left eye 2 (two) times daily.      budesonide-formoterol (SYMBICORT) 160-4.5 MCG/ACT inhaler Inhale 2 puffs into the lungs 2 (two) times daily.     Calcium Carb-Cholecalciferol (CALCIUM + D3) 600-200 MG-UNIT TABS Take 1 tablet by mouth daily.     Cholecalciferol (VITAMIN D3) 2000 units capsule Take 4,000 Units by mouth daily.     Coenzyme Q10 (COQ10) 100 MG CAPS Take 100 mg by mouth daily.     latanoprost (XALATAN) 0.005 % ophthalmic solution Place 1 drop into both eyes at bedtime.     levothyroxine (SYNTHROID, LEVOTHROID) 100 MCG tablet Take 100 mcg by mouth daily before breakfast.   0   LORazepam (ATIVAN) 0.5 MG tablet Take 1 tablet (0.5 mg total) by mouth once for 1 dose. 1hr before MRI, OK to take second dose if needed before MRI 2 tablet 0   LUTEIN PO Take 1 tablet by mouth daily.     mirtazapine (REMERON) 15 MG tablet  Take 15 mg by mouth at bedtime.     Multiple Vitamin (MULTIVITAMIN) tablet Take 1 tablet by mouth daily.     nitroGLYCERIN (NITROSTAT) 0.4 MG SL tablet Place 1 tablet (0.4 mg total) under the tongue every 5 (five) minutes as needed for chest pain. 25 tablet 3   Omega-3 Fatty Acids (FISH OIL PO) Take 1 capsule by mouth daily.     pravastatin (PRAVACHOL) 40 MG tablet TAKE 1 TABLET BY MOUTH DAILY GENERIC EQUIVALENT FOR PRAVACHOL 90 tablet 0   tamoxifen (NOLVADEX) 20 MG tablet Take 1 tablet (20 mg total) by mouth daily. 90 tablet 1   TOVIAZ 4 MG TB24 tablet Take 4 mg by mouth daily.   1   traMADol (ULTRAM) 50 MG tablet Take 0.5-1 tablets (25-50 mg total) by mouth every 8 (eight) hours as needed. 20 tablet 0   Current Facility-Administered Medications  Medication Dose Route Frequency Provider Last Rate Last Admin   aflibercept (EYLEA) SOLN 2 mg  2 mg Intravitreal  Bernarda Caffey, MD   2 mg at 04/16/18 1424   aflibercept (EYLEA) SOLN 2 mg  2 mg Intravitreal  Bernarda Caffey, MD   2 mg at 05/14/18 1358   aflibercept (EYLEA) SOLN 2 mg  2 mg Intravitreal  Bernarda Caffey, MD   2 mg at 06/25/18 1433   aflibercept (EYLEA) SOLN 2 mg  2 mg Intravitreal  Bernarda Caffey, MD   2 mg at 08/07/18 1557   aflibercept (EYLEA) SOLN 2 mg  2 mg Intravitreal  Bernarda Caffey, MD   2 mg at 10/11/18 1522   Bevacizumab (AVASTIN) SOLN 1.25 mg  1.25 mg Intravitreal  Bernarda Caffey, MD   1.25 mg at 01/07/18 1429   Bevacizumab (AVASTIN) SOLN 1.25 mg  1.25 mg Intravitreal  Bernarda Caffey, MD  1.25 mg at 02/19/18 1356   Bevacizumab (AVASTIN) SOLN 1.25 mg  1.25 mg Intravitreal  Bernarda Caffey, MD   1.25 mg at 03/19/18 1347    PHYSICAL EXAMINATION: ECOG PERFORMANCE STATUS: 3 - Symptomatic, >50% confined to bed  Vitals:   10/20/21 1011  BP: 126/76  Pulse: 77  Resp: 18  Temp: 97.8 F (36.6 C)  SpO2: 98%   Wt Readings from Last 3 Encounters:  10/20/21 149 lb 3.2 oz (67.7 kg)  06/21/21 150 lb 14.4 oz (68.4 kg)  02/04/21 146 lb  11.2 oz (66.5 kg)     GENERAL:alert, no distress and comfortable SKIN: skin color, texture, turgor are normal, no rashes or significant lesions EYES: normal, Conjunctiva are pink and non-injected, sclera clear  NECK: supple, thyroid normal size, non-tender, without nodularity LYMPH:  no palpable lymphadenopathy in the cervical, axillary  LUNGS: clear to auscultation and percussion with normal breathing effort HEART: regular rate & rhythm and no murmurs and no lower extremity edema ABDOMEN:abdomen soft, non-tender and normal bowel sounds Musculoskeletal:no cyanosis of digits and no clubbing  NEURO: alert & oriented x 3 with fluent speech, no focal motor/sensory deficits  LABORATORY DATA:  I have reviewed the data as listed CBC Latest Ref Rng & Units 10/20/2021 06/13/2021 02/01/2021  WBC 4.0 - 10.5 K/uL 7.0 5.0 5.8  Hemoglobin 12.0 - 15.0 g/dL 13.1 12.8 13.2  Hematocrit 36.0 - 46.0 % 38.3 37.0 38.6  Platelets 150 - 400 K/uL 179 202 212     CMP Latest Ref Rng & Units 10/20/2021 06/13/2021 02/01/2021  Glucose 70 - 99 mg/dL 87 93 95  BUN 8 - 23 mg/dL 27(H) 18 16  Creatinine 0.44 - 1.00 mg/dL 0.85 0.65 0.76  Sodium 135 - 145 mmol/L 140 135 140  Potassium 3.5 - 5.1 mmol/L 4.5 3.8 4.1  Chloride 98 - 111 mmol/L 105 103 104  CO2 22 - 32 mmol/L _0 Calcium 8.9 - 10.3 mg/dL 9.7 9.1 9.7  Total Protein 6.5 - 8.1 g/dL 7.2 6.9 7.3  Total Bilirubin 0.3 - 1.2 mg/dL 0.4 0.7 0.5  Alkaline Phos 38 - 126 U/L 47 41 47  AST 15 - 41 U/L _1 ALT 0 - 44 U/L _2 RADIOGRAPHIC STUDIES: I have personally reviewed the radiological images as listed and agreed with the findings in the report. No results found.    Orders Placed This Encounter  Procedures   MR Lumbar Spine W Wo Contrast    Standing Status:   Future    Standing Expiration Date:   10/21/2022    Order Specific Question:   If indicated for the ordered procedure, I authorize the administration of contrast media per Radiology  protocol    Answer:   Yes    Order Specific Question:   What is the patient's sedation requirement?    Answer:   No Sedation    Order Specific Question:   Does the patient have a pacemaker or implanted devices?    Answer:   No    Order Specific Question:   Use SRS Protocol?    Answer:   No    Order Specific Question:   Preferred imaging location?    Answer:   [facility] (table limit - 550 lbs)   All questions were answered. The patient knows to call the clinic with any problems, questions or concerns. No barriers to learning was detected. The total time spent in  the appointment was 30 minutes.     [name], MD 10/20/2021   I, Wilburn Mylar, am acting as scribe for [name], MD.   I have reviewed the above documentation for accuracy and completeness, and I agree with the above.

## 2021-10-21 ENCOUNTER — Telehealth: Payer: Self-pay

## 2021-10-21 ENCOUNTER — Telehealth: Payer: Self-pay | Admitting: Hematology

## 2021-10-21 NOTE — Telephone Encounter (Signed)
Scheduled follow-up appointment per 3/2 los. Patient's son is aware. ?

## 2021-10-21 NOTE — Telephone Encounter (Signed)
This nurse reached out to patients son, Josph Macho, to schedule appointment for MRI.  There was no answer.  This nurse left a message and asked patient to return call to Central Scheduling to get this scan scheduled.  No further concerns at this time.   ?

## 2021-11-10 ENCOUNTER — Ambulatory Visit (HOSPITAL_COMMUNITY)
Admission: RE | Admit: 2021-11-10 | Discharge: 2021-11-10 | Disposition: A | Discharge status: Home / Self Care | Payer: Medicare Other | Source: Ambulatory Visit | Attending: Hematology | Admitting: Hematology

## 2021-11-10 ENCOUNTER — Other Ambulatory Visit: Payer: Self-pay

## 2021-11-10 DIAGNOSIS — C50411 Malignant neoplasm of upper-outer quadrant of right female breast: Secondary | ICD-10-CM | POA: Diagnosis not present

## 2021-11-10 DIAGNOSIS — M5127 Other intervertebral disc displacement, lumbosacral region: Secondary | ICD-10-CM | POA: Diagnosis not present

## 2021-11-10 DIAGNOSIS — Z170 Estrogen receptor positive status [ER+]: Secondary | ICD-10-CM | POA: Diagnosis not present

## 2021-11-10 DIAGNOSIS — M48061 Spinal stenosis, lumbar region without neurogenic claudication: Secondary | ICD-10-CM | POA: Diagnosis not present

## 2021-11-10 MED ORDER — GADOBUTROL 1 MMOL/ML IV SOLN
6.0000 mL | Freq: Once | INTRAVENOUS | Status: AC | PRN
  Administered 2021-11-10: 6 mL via INTRAVENOUS

## 2021-11-18 ENCOUNTER — Inpatient Hospital Stay: Payer: Medicare Other | Admitting: Hematology

## 2021-11-18 ENCOUNTER — Other Ambulatory Visit: Payer: Self-pay

## 2021-11-18 VITALS — BP 154/79 | HR 75 | Temp 98.6°F | Resp 18 | Ht 70.0 in | Wt 149.4 lb

## 2021-11-18 DIAGNOSIS — E079 Disorder of thyroid, unspecified: Secondary | ICD-10-CM | POA: Diagnosis not present

## 2021-11-18 DIAGNOSIS — R918 Other nonspecific abnormal finding of lung field: Secondary | ICD-10-CM | POA: Diagnosis not present

## 2021-11-18 DIAGNOSIS — Z9049 Acquired absence of other specified parts of digestive tract: Secondary | ICD-10-CM | POA: Diagnosis not present

## 2021-11-18 DIAGNOSIS — M545 Low back pain, unspecified: Secondary | ICD-10-CM | POA: Diagnosis not present

## 2021-11-18 DIAGNOSIS — M810 Age-related osteoporosis without current pathological fracture: Secondary | ICD-10-CM | POA: Diagnosis not present

## 2021-11-18 DIAGNOSIS — C182 Malignant neoplasm of ascending colon: Secondary | ICD-10-CM | POA: Diagnosis not present

## 2021-11-18 DIAGNOSIS — C50411 Malignant neoplasm of upper-outer quadrant of right female breast: Secondary | ICD-10-CM

## 2021-11-18 DIAGNOSIS — Z79810 Long term (current) use of selective estrogen receptor modulators (SERMs): Secondary | ICD-10-CM | POA: Diagnosis not present

## 2021-11-18 DIAGNOSIS — J449 Chronic obstructive pulmonary disease, unspecified: Secondary | ICD-10-CM | POA: Diagnosis not present

## 2021-11-18 DIAGNOSIS — I10 Essential (primary) hypertension: Secondary | ICD-10-CM | POA: Diagnosis not present

## 2021-11-18 DIAGNOSIS — Z170 Estrogen receptor positive status [ER+]: Secondary | ICD-10-CM | POA: Diagnosis not present

## 2021-11-18 DIAGNOSIS — Z85038 Personal history of other malignant neoplasm of large intestine: Secondary | ICD-10-CM | POA: Diagnosis not present

## 2021-11-18 DIAGNOSIS — Z1231 Encounter for screening mammogram for malignant neoplasm of breast: Secondary | ICD-10-CM

## 2021-11-18 DIAGNOSIS — R202 Paresthesia of skin: Secondary | ICD-10-CM | POA: Diagnosis not present

## 2021-11-18 DIAGNOSIS — I2510 Atherosclerotic heart disease of native coronary artery without angina pectoris: Secondary | ICD-10-CM | POA: Diagnosis not present

## 2021-11-18 DIAGNOSIS — R636 Underweight: Secondary | ICD-10-CM | POA: Diagnosis not present

## 2021-11-18 DIAGNOSIS — R5383 Other fatigue: Secondary | ICD-10-CM | POA: Diagnosis not present

## 2021-11-18 DIAGNOSIS — M25552 Pain in left hip: Secondary | ICD-10-CM | POA: Diagnosis not present

## 2021-11-18 MED ORDER — TRAMADOL HCL 50 MG PO TABS: 25.0000 mg | ORAL_TABLET | Freq: Three times a day (TID) | ORAL | 0 refills | Status: DC | PRN

## 2021-11-18 NOTE — Progress Notes (Signed)
?[facility]   ?[phone] [phone]   ?Clinic Follow up Note  ? ?Patient Care Team: ?[name], MD as PCP - General (Family Medicine) ?Skeet Latch, MD as PCP - Cardiology (Cardiology) ?Rockwell Germany, RN as Oncology Nurse Navigator ?Mauro Kaufmann, RN as Oncology Nurse Navigator ?Jovita Kussmaul, MD as Consulting Physician (General Surgery) ?[name], MD as Consulting Physician (Hematology) ?Eppie Gibson, MD as Attending Physician (Radiation Oncology) ? ?Date of Service:  11/18/2021 ? ?CHIEF COMPLAINT: f/u of colon and right breast cancers ? ?CURRENT THERAPY:  ?Tamoxifen 18m once daily since 03/2019. Held 05/2019-07/2019 for surgery.  ? ?ASSESSMENT & PLAN:  ?Marissa Caldwell is a 88y.o. post-menopausal female with  ? ?1. Worsening low back pain ?-she reports worsening back and left hip pain over the last four months. She experienced left upper leg tingling and was given prednisone by her PCP; this helped the tingling but not the pain. ?-she reports the pain can get to 10/10; she denies taking any pain medication. I called in tramadol on 10/20/21. ?-lumbar spine MRI on 11/10/21 was negative for metastatic disease or acute abnormality, she does have mild left L5-S1 neural foraminal stenosis, which is likely the cause of her pain, overall unchanged. ?-I advised her to return to her PCP or see an orthopedic surgeon for management  ?  ?2. Cancer of right colon, pT2N0M0, stage I  ?-She was diagnosed in 09/2019 by colonoscopy with [name] ?-She underwent right colectomy on 12/15/19 with Dr. [name] Her path showed 4.6cm invasive moderately differentiated adenocarcinoma which invades the muscularis propria. Negative margins and LNs.  ?-restaging PET 06/13/21 was NED in abdomen or pelvis. ?-From a colon cancer standpoint, she is clinically stable.  ?-Continue surveillance. Last colonoscopy was at diagnosis. No repeated colonoscopy due to her age  ?-f/u in 4 months.   ?  ?3.  Malignant neoplasm of upper-outer quadrant of right breast, mucinous carcinoma, pT1cN2aMx, ER/PR+, HER2-, Grade II ?-s/p right breast lumpectomy by [name] 06/19/19, path showed 2 cm mucinous carcinoma and three positive lymph nodes (two with ECE) plus one with micrometastasis. Margins negative. ?-she received adjuvant RT with Dr. SIsidore Moos12/15/20-1/13/21.  ?-She started Tamoxifen in 03/2019 and held 05/2019-07/2019 for surgery.  ?-most recent MM 04/05/21 was benign ?-From a breast cancer standpoint, she is stable.  ?-Continue Tamoxifen and surveillance with yearly mammograms. Next mammogram in 03/2022; I ordered today. ?  ?4. COPD, history of heavy smoking, bilateral lung nodules  ?-On inhalers, not on home oxygen  ?-She still smokes occasionally. She has not completely quit.   ?-PET from 07/22/19 shows 137mground-glass nodules in RU lung. ?-nodules appear stable on 06/13/21 PET. Will repeat CT chest in a year  ?  ?6. Comorbidities: CAD, HTN, Thyroid dysfunction, Osteoporosis, Arthritis, Macular Degeneration of both eyes, hearing loss, Anxiety/Depression.  ?-s/p cardiac stent placement ?-continue medications and f/u per PCP, cardiology ?-She has received Prolia injections q6m28monthith Dr. WhiDema Severinhe will continue Calcium and Vit D.  ?  ?  ?PLAN:  ?-Continue Tamoxifen  ?-I refilled tramadol and advised her to f/u with her PCP for her back pain  ?-mammogram due 03/2022 ?-lab and f/u in 6 months ? ? ?No problem-specific Assessment & Plan notes found for this encounter. ? ? ?SUMMARY OF ONCOLOGIC HISTORY: ?Oncology History Overview Note  ?Cancer Staging ?Cancer of right colon ([place]CC[place]hesStaging form: Colon and Rectum, AJCC 8th Edition ?- Pathologic stage from 12/15/2019: Stage I (pT2, pN0,  cM0) - Signed by [name], MD on 01/08/2020 ? ?Malignant neoplasm of upper-outer quadrant of right breast in female, estrogen receptor positive ([place]) ?Staging form: Breast, AJCC 8th Edition ?- Clinical: Stage IIA (cT1c, cN1, cM0, G3, ER+,  PR+, HER2-) - Signed by [name], MD on 04/09/2019 ?- Pathologic stage from 06/19/2019: Stage IB (pT1c, pN2a, cM0, G2, ER+, PR+, HER2-) - Signed by [name], MD on 07/03/2019 ? ?  ?Malignant neoplasm of upper-outer quadrant of right breast in female, estrogen receptor positive ([place])  ?03/19/2019 Mammogram  ? Diagnostic Mammogram 03/19/19  ?IMPRESSION ?The 2 cm distortion in the  right breast 9:30 position middle depth 3cm from the nipple is indeterminate. [place] recommended.  ?There is also a 1.2cmx3.2cm enlarged right axillary LN highly suggestive of malignancy.  ? ?  ?03/31/2019 Initial Biopsy  ? Diagnosis 03/31/19  ?1. Breast, right, needle core biopsy, 9:30 o'clock, mid depth, 3cmfn ?- INVASIVE DUCTAL CARCINOMA. ?- LYMPHOVASCULAR INVASION IS IDENTIFIED. ?- SEE COMMENT. ?2. Lymph node, needle/core biopsy, right axilla ?- INVASIVE DUCTAL CARCINOMA. ?- SEE COMMENT. ?  ?03/31/2019 Receptors her2  ? 1. PROGNOSTIC INDICATORS ?Results: ?IMMUNOHISTOCHEMICAL AND MORPHOMETRIC ANALYSIS PERFORMED MANUALLY ?The tumor cells are NEGATIVE for Her2 (0). ?Estrogen Receptor: 100%, POSITIVE, STRONG STAINING INTENSITY ?Progesterone Receptor: 10%, POSITIVE, STRONG STAINING INTENSITY ?Proliferation Marker Ki67: 10% ? ?2. PROGNOSTIC INDICATORS ?Results: ?IMMUNOHISTOCHEMICAL AND MORPHOMETRIC ANALYSIS PERFORMED MANUALLY ?Estrogen Receptor: 100%, POSITIVE, STRONG STAINING INTENSITY ?Progesterone Receptor: 0%, NEGATIVE ?Proliferation Marker Ki67: 40% ?  ?04/03/2019 Initial Diagnosis  ? Malignant neoplasm of upper-outer quadrant of right breast in female, estrogen receptor positive ([place]) ?  ?04/09/2019 Cancer Staging  ? Staging form: Breast, AJCC 8th Edition ?- Clinical: Stage IIA (cT1c, cN1, cM0, G3, ER+, PR+, HER2-) - Signed by [name], MD on 04/09/2019 ? ?Staging form: Breast, AJCC 8th Edition ?- Clinical: Stage IIA (cT1c, cN1, cM0, G3, ER+, PR+, HER2-) - Signed by [name], MD on 04/09/2019 ? ?  ?03/2019 -  Anti-estrogen oral therapy  ? Tamoxifen  53m daily starting 03/2019. Held 05/30/19 -07/2019 for surgery. ?  ?06/19/2019 Surgery  ? RIGHT BREAST LUMPECTOMY WITH RADIOACTIVE SEED AND SENTINEL LYMPH NODE BIOPSY by Dr [name] ?06/19/19  ?  ?06/19/2019 Pathology Results  ? FINAL MICROSCOPIC DIAGNOSIS: 06/19/19 ? ?A. BREAST, RIGHT, LUMPECTOMY:  ?-  Mucinous carcinoma, Nottingham grade 2  ?-  Margins uninvolved by carcinoma (0.2 cm; medial margin)  ?-  Lymphovascular space invasion present  ?-  Previous biopsy site changes present  ?-  See oncology table and comment below  ? ?B. LYMPH NODE, RIGHT AXILLARY #1, SENTINEL, BIOPSY:  ?-  Metastatic carcinoma involving two lymph nodes (2/2)  ?-  Extracapsular extension present  ?-  See comment  ? ?C. BREAST, RIGHT MEDIAL MARGIN, EXCISION:  ?-  Residual invasive carcinoma  ?-  Usual ductal hyperplasia with calcifications  ?-  See comment  ? ?D. LYMPH NODE, RIGHT AXILLARY #2, SENTINEL, BIOPSY:  ?-  Metastatic carcinoma involving one lymph node (1/1)  ? ?E. LYMPH NODE, RIGHT AXILLARY #1 , BIOPSY:  ?-  Metastatic carcinoma involving one lymph node (1/1)  ? ?F. LYMPH NODE, RIGHT AXILLARY #2, BIOPSY:  ?-  No carcinoma identified in one lymph node (0/1)  ?  ?06/19/2019 Cancer Staging  ? Staging form: Breast, AJCC 8th Edition ?- Pathologic stage from 06/19/2019: Stage IB (pT1c, pN2a, cM0, G2, ER+, PR+, HER2-) - Signed by FTruitt Merle MD on 07/03/2019 ? ?  ?07/22/2019 PET scan  ? IMPRESSION: ?1. Low  level hypermetabolism identified in the surgical beds of the ?right breast and right axilla. This is presumably related to the ?surgery. ?2. Tiny right subpectoral lymph nodes are asymmetric in concerning ?for metastatic disease on CT imaging. No hypermetabolism on the PET ?study although size of these lymph nodes is below reliable ?resolution on PET imaging. ?3. 10 mm short axis precarinal lymph node shows low level ?hypermetabolism. Metastatic involvement not excluded. ?4. 11 mm ground-glass nodule in right upper lobe shows low level  FDG ?accumulation. Well differentiated or low-grade neoplasm is a ?distinct concern. Close follow-up recommended. ?5. Focal hypermetabolism in the right colon, potentially related to ?the ileocecal valv

## 2021-11-19 ENCOUNTER — Encounter: Payer: Self-pay | Admitting: Hematology

## 2021-11-21 ENCOUNTER — Telehealth: Payer: Self-pay | Admitting: Hematology

## 2021-11-21 NOTE — Telephone Encounter (Signed)
Scheduled follow-up appointment per 3/31 los. Patient's son is aware. ?

## 2021-11-22 DIAGNOSIS — H401331 Pigmentary glaucoma, bilateral, mild stage: Secondary | ICD-10-CM | POA: Diagnosis not present

## 2021-11-22 DIAGNOSIS — H04123 Dry eye syndrome of bilateral lacrimal glands: Secondary | ICD-10-CM | POA: Diagnosis not present

## 2021-11-23 ENCOUNTER — Other Ambulatory Visit: Payer: Medicare Other

## 2021-11-25 ENCOUNTER — Ambulatory Visit: Payer: Medicare Other | Admitting: Hematology

## 2021-12-08 DIAGNOSIS — M5136 Other intervertebral disc degeneration, lumbar region: Secondary | ICD-10-CM | POA: Diagnosis not present

## 2021-12-22 ENCOUNTER — Encounter: Payer: Self-pay | Admitting: Specialist

## 2021-12-22 ENCOUNTER — Ambulatory Visit: Payer: Medicare Other | Admitting: Specialist

## 2021-12-22 VITALS — BP 125/74 | HR 74 | Ht 70.0 in | Wt 149.4 lb

## 2021-12-22 DIAGNOSIS — I70213 Atherosclerosis of native arteries of extremities with intermittent claudication, bilateral legs: Secondary | ICD-10-CM | POA: Diagnosis not present

## 2021-12-22 DIAGNOSIS — M48062 Spinal stenosis, lumbar region with neurogenic claudication: Secondary | ICD-10-CM

## 2021-12-22 MED ORDER — GABAPENTIN 100 MG PO CAPS: 100.0000 mg | ORAL_CAPSULE | Freq: Every day | ORAL | 3 refills | Status: AC

## 2021-12-22 MED ORDER — MELOXICAM 15 MG PO TABS: 15.0000 mg | ORAL_TABLET | Freq: Every day | ORAL | 0 refills | Status: DC

## 2021-12-22 NOTE — Progress Notes (Addendum)
? ?Office Visit Note ?  ?Patient: Marissa Caldwell           ?Date of Birth: 07-23-1932           ?MRN: [id number] ?Visit Date: 12/22/2021 ?             ?Requested by: [name], MD ?[address] ?[address] ?[place],  [place] [address] ?PCP: [name], MD ? ? ?Assessment & Plan: ?Visit Diagnoses:  ?1. Spinal stenosis of lumbar region with neurogenic claudication   ?2. 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 ? ?Plan: Avoid bending, stooping and avoid lifting weights greater than 10 lbs. ?Avoid prolong standing and walking. ?Avoid frequent bending and stooping  ?No lifting greater than 10 lbs. ?May use ice or moist heat for pain. ?Weight loss is of benefit. ?Handicap license is approved. ?Obtain doppler arterial studies to assess for vascular cause of claudication and nocturnal pain as CT of abdomen show significant asclerotic disease of the Aortoiliac vessels and she has previous smoking hx and cardiac stent, no palpable left leg pulse.  ?Obtain neurology eval to assess for a localized radicular cause of the ongoing left leg pain as there is  ?Multiple level neuroforamenal narrowing present that is likely greater with standing and walking.  ?Stop alleve and take meloxicam one tablet a day with meal or snack for 2 weeks then stop and take once a day as needed for pain ?Gabapentin 100 mg one tablet at night for night pain and nerve pain.  ?Follow-Up Instructions: No follow-ups on file.  ? ?Orders:  ?No orders of the defined types were placed in this encounter. ? ?No orders of the defined types were placed in this encounter. ? ? ? ? Procedures: ?No procedures performed ? ? ?Clinical Data: ?No additional findings. ? ? ?Subjective: ?Chief Complaint  ?Patient presents with  ? Lower Back - Pain  ? ? ?41 yeaar old female with history of back and leg pain that worsens with standing and walking and improves with sitting and with leaning on cart with grocery shopping. It is at the  point where she has difficulty making into the office from the parking lot. ?No bowel or bladder difficulty. The legs feel weak with standing and walking. This has been present for more than 43month.  ? ? ?Review of Systems  ?Constitutional: Negative.   ?HENT: Negative.    ?Eyes: Negative.   ?Respiratory: Negative.    ?Cardiovascular: Negative.   ?Gastrointestinal: Negative.   ?Endocrine: Negative.   ?Genitourinary: Negative.   ?Musculoskeletal: Negative.   ?Skin: Negative.   ?Allergic/Immunologic: Negative.   ?Neurological: Negative.   ?Hematological: Negative.   ?Psychiatric/Behavioral: Negative.    ? ? ?Objective: ?Vital Signs: BP 125/74   Pulse 74   Ht '5\' 10"'$  (1.778 m)   Wt 149 lb 6.4 oz (67.8 kg)   BMI 21.44 kg/m?  ? ?Physical Exam ?Constitutional:   ?   Appearance: She is well-developed.  ?HENT:  ?   Head: Normocephalic and atraumatic.  ?Eyes:  ?   Pupils: Pupils are equal, round, and reactive to light.  ?Pulmonary:  ?   Effort: Pulmonary effort is normal.  ?   Breath sounds: Normal breath sounds.  ?Abdominal:  ?   General: Bowel sounds are normal.  ?   Palpations: Abdomen is soft.  ?Musculoskeletal:  ?   Cervical back: Normal range of motion and neck supple.  ?   Lumbar back: Negative  right straight leg raise test and negative left straight leg raise test.  ?Skin: ?   General: Skin is warm and dry.  ?Neurological:  ?   Mental Status: She is alert and oriented to person, place, and time.  ?Psychiatric:     ?   Behavior: Behavior normal.     ?   Thought Content: Thought content normal.     ?   Judgment: Judgment normal.  ? ? ?Back Exam  ? ?Tenderness  ?The patient is experiencing tenderness in the lumbar. ? ?Range of Motion  ?Extension:  abnormal  ?Flexion:  abnormal  ?Lateral bend right:  abnormal  ?Lateral bend left:  abnormal  ?Rotation right:  abnormal  ?Rotation left:  abnormal  ? ?Muscle Strength  ?Right Quadriceps:  5/5  ?Left Quadriceps:  5/5  ?Right Hamstrings:  5/5  ?Left Hamstrings:  5/5   ? ?Tests  ?Straight leg raise right: negative ?Straight leg raise left: negative ? ?Reflexes  ?Patellar:  0/4 ?Achilles:  0/4 ? ?Other  ?Toe walk: normal ?Heel walk: normal ?Sensation: normal ?Gait: normal  ? ? ? ? ?Specialty Comments:  ?No specialty comments available. ? ?Imaging: ?No results found. ? ? ?PMFS History: ?Patient Active Problem List  ? Diagnosis Date Noted  ? Malnutrition of moderate degree 12/22/2019  ? HOH (hard of hearing) 12/21/2019  ? Cancer of right colon ([place]) 12/15/2019  ? Malignant neoplasm of upper-outer quadrant of right breast in female, estrogen receptor positive ([place]) 04/03/2019  ? Chronic diastolic heart failure ([place]) 06/25/2017  ? COPD (chronic obstructive pulmonary disease) ([place]) 04/13/2017  ? Chest pain 03/27/2017  ? CAD S/P percutaneous coronary angioplasty 03/26/2017  ? Abnormal nuclear stress test 03/25/2017  ? Dyslipidemia 03/11/2017  ? Hypothyroidism 03/11/2017  ? Shortness of breath 03/11/2017  ? ?Past Medical History:  ?Diagnosis Date  ? Anxiety   ? Arthritis   ? Breast cancer ([place])   ? CAD (coronary artery disease)   ? a. 03/2017: 95% LCx stenosis --> PCI/DES placed  ? Cataract   ? Chest pain 03/27/2017  ? Chronic diastolic heart failure ([place]) 06/25/2017  ? Chronic lower back pain   ? Colon cancer ([place]) 11/2019  ? s/p colectomy  ? COPD (chronic obstructive pulmonary disease) ([place])   ? Coronary artery disease   ? Diverticulitis   ? Diverticulosis   ? GERD (gastroesophageal reflux disease)   ? Glaucoma   ? Heart failure, type unknown ([place]) 03/11/2017  ? History of radiation therapy 08/05/19- 09/03/19  ? Right Breast/ SCV 16 fractions of 2.66 Gy each for a total of 42.56 Gy. Right breast boost 4 fractions of 2 Gy each to total 8 Gy.   ? Hyperlipidemia 03/11/2017  ? Hypertension   ? Hypothyroidism   ? Macular degeneration   ? wet and dry per pt's son  ? Personal history of chemotherapy   ? Personal history of radiation therapy   ? Pneumonia X 1  ? "years and years ago" (03/26/2017)  ?  Shortness of breath 03/11/2017  ? Status post dilation of esophageal narrowing   ?  ?Family History  ?Problem Relation Age of Onset  ? CAD Mother   ? Cataracts Mother   ? Coronary artery disease Father   ? Leukemia Sister   ? Amblyopia Neg Hx   ? Blindness Neg Hx   ? Diabetes Neg Hx   ? Glaucoma Neg Hx   ? Macular degeneration Neg Hx   ? Retinal detachment Neg Hx   ?  Strabismus Neg Hx   ? Retinitis pigmentosa Neg Hx   ? Colon cancer Neg Hx   ? Colon polyps Neg Hx   ? Esophageal cancer Neg Hx   ? Rectal cancer Neg Hx   ? Stomach cancer Neg Hx   ?  ?Past Surgical History:  ?Procedure Laterality Date  ? BILATERAL OOPHORECTOMY Bilateral   ? BREAST LUMPECTOMY Right 06/19/2019  ? BREAST LUMPECTOMY WITH RADIOACTIVE SEED AND SENTINEL LYMPH NODE BIOPSY Right 06/19/2019  ? Procedure: RIGHT BREAST LUMPECTOMY WITH RADIOACTIVE SEED AND SENTINEL LYMPH NODE BIOPSY;  Surgeon: Jovita Kussmaul, MD;  Location: [facility];  Service: General;  Laterality: Right;  ? CATARACT EXTRACTION    ? CATARACT EXTRACTION W/ INTRAOCULAR LENS  IMPLANT, BILATERAL Bilateral   ? COLOSTOMY    ? Greater than 10 yrs  ? CORONARY ANGIOPLASTY WITH STENT PLACEMENT  03/26/2017  ? LAPAROSCOPIC RIGHT COLECTOMY Right 12/15/2019  ? Procedure: LAPAROSCOPIC ASSISTED RIGHT COLECTOMY;  Surgeon: Jovita Kussmaul, MD;  Location: [facility];  Service: General;  Laterality: Right;  ? LEFT HEART CATH AND CORONARY ANGIOGRAPHY N/A 03/26/2017  ? Procedure: Left Heart Cath and Coronary Angiography;  Surgeon: Belva Crome, MD;  Location: [facility];  Service: Cardiovascular;  Laterality: N/A;  ? NERVE SURGERY    ? "lump on my sciatic nerve was removed years ago"  ? SHOULDER OPEN ROTATOR CUFF REPAIR Right   ? THYROIDECTOMY, PARTIAL    ? TONSILLECTOMY    ? UPPER GASTROINTESTINAL ENDOSCOPY    ? > ten yrs  ? ?Social History  ? ?Occupational History  ? Occupation: retired  ?Tobacco Use  ? Smoking status: Former  ?  Packs/day: 0.50  ?  Years: 60.00  ?  Pack years: 30.00  ?  Types: Cigarettes   ? Smokeless tobacco: Never  ?Vaping Use  ? Vaping Use: Never used  ?Substance and Sexual Activity  ? Alcohol use: Yes  ?  Comment: twice a month  ? Drug use: No  ? Sexual activity: Not Currently  ? ? ? ? ? ? ?

## 2021-12-22 NOTE — Patient Instructions (Addendum)
Avoid bending, stooping and avoid lifting weights greater than 10 lbs. ?Avoid prolong standing and walking. ?Avoid frequent bending and stooping  ?No lifting greater than 10 lbs. ?May use ice or moist heat for pain. ?Weight loss is of benefit. ?Handicap license is approved. ?Obtain doppler arterial studies to assess for vascular cause of claudication and nocturnal pain as CT of abdomen show significant asclerotic disease of the Aortoiliac vessels and she has previous smoking hx and cardiac stent, no palpable left leg pulse.  ?Obtain neurology eval to assess for a localized radicular cause of the ongoing left leg pain as there is  ?Multiple level neuroforamenal narrowing present that is likely greater with standing and walking.  ?Stop alleve and take meloxicam one tablet a day with meal or snack for 2 weeks then stop and take once a day as needed for pain ?Gabapentin 100 mg one tablet at night for night pain and nerve pain.  ?

## 2021-12-30 ENCOUNTER — Other Ambulatory Visit (HOSPITAL_BASED_OUTPATIENT_CLINIC_OR_DEPARTMENT_OTHER): Payer: Self-pay | Admitting: Specialist

## 2021-12-30 ENCOUNTER — Other Ambulatory Visit: Payer: Self-pay | Admitting: Specialist

## 2021-12-30 DIAGNOSIS — M4726 Other spondylosis with radiculopathy, lumbar region: Secondary | ICD-10-CM

## 2021-12-30 DIAGNOSIS — I739 Peripheral vascular disease, unspecified: Secondary | ICD-10-CM

## 2022-01-03 ENCOUNTER — Ambulatory Visit (INDEPENDENT_AMBULATORY_CARE_PROVIDER_SITE_OTHER): Payer: Medicare Other

## 2022-01-03 DIAGNOSIS — I70213 Atherosclerosis of native arteries of extremities with intermittent claudication, bilateral legs: Secondary | ICD-10-CM

## 2022-01-03 DIAGNOSIS — I739 Peripheral vascular disease, unspecified: Secondary | ICD-10-CM

## 2022-01-09 ENCOUNTER — Encounter (HOSPITAL_BASED_OUTPATIENT_CLINIC_OR_DEPARTMENT_OTHER): Payer: Self-pay

## 2022-01-09 ENCOUNTER — Emergency Department (HOSPITAL_BASED_OUTPATIENT_CLINIC_OR_DEPARTMENT_OTHER)
Admission: EM | Admit: 2022-01-09 | Discharge: 2022-01-09 | Disposition: A | Discharge status: Home / Self Care | Payer: Medicare Other | Attending: Emergency Medicine | Admitting: Emergency Medicine

## 2022-01-09 ENCOUNTER — Emergency Department (HOSPITAL_BASED_OUTPATIENT_CLINIC_OR_DEPARTMENT_OTHER): Payer: Medicare Other

## 2022-01-09 ENCOUNTER — Other Ambulatory Visit: Payer: Self-pay

## 2022-01-09 DIAGNOSIS — R1011 Right upper quadrant pain: Secondary | ICD-10-CM | POA: Diagnosis present

## 2022-01-09 DIAGNOSIS — K5900 Constipation, unspecified: Secondary | ICD-10-CM | POA: Insufficient documentation

## 2022-01-09 DIAGNOSIS — Z7982 Long term (current) use of aspirin: Secondary | ICD-10-CM | POA: Insufficient documentation

## 2022-01-09 DIAGNOSIS — K5730 Diverticulosis of large intestine without perforation or abscess without bleeding: Secondary | ICD-10-CM | POA: Diagnosis not present

## 2022-01-09 DIAGNOSIS — D1809 Hemangioma of other sites: Secondary | ICD-10-CM | POA: Diagnosis not present

## 2022-01-09 LAB — CBC WITH DIFFERENTIAL/PLATELET
Abs Immature Granulocytes: 0.05 10*3/uL (ref 0.00–0.07)
Basophils Absolute: 0 10*3/uL (ref 0.0–0.1)
Basophils Relative: 1 %
Eosinophils Absolute: 0 10*3/uL (ref 0.0–0.5)
Eosinophils Relative: 1 %
HCT: 39.8 % (ref 36.0–46.0)
Hemoglobin: 13.4 g/dL (ref 12.0–15.0)
Immature Granulocytes: 1 %
Lymphocytes Relative: 15 %
Lymphs Abs: 0.9 10*3/uL (ref 0.7–4.0)
MCH: 31.6 pg (ref 26.0–34.0)
MCHC: 33.7 g/dL (ref 30.0–36.0)
MCV: 93.9 fL (ref 80.0–100.0)
Monocytes Absolute: 0.6 10*3/uL (ref 0.1–1.0)
Monocytes Relative: 11 %
Neutro Abs: 4.3 10*3/uL (ref 1.7–7.7)
Neutrophils Relative %: 71 %
Platelets: 229 10*3/uL (ref 150–400)
RBC: 4.24 MIL/uL (ref 3.87–5.11)
RDW: 13.8 % (ref 11.5–15.5)
WBC: 5.9 10*3/uL (ref 4.0–10.5)
nRBC: 0 % (ref 0.0–0.2)

## 2022-01-09 LAB — COMPREHENSIVE METABOLIC PANEL
ALT: 13 U/L (ref 0–44)
AST: 23 U/L (ref 15–41)
Albumin: 4 g/dL (ref 3.5–5.0)
Alkaline Phosphatase: 44 U/L (ref 38–126)
Anion gap: 13 (ref 5–15)
BUN: 10 mg/dL (ref 8–23)
CO2: 23 mmol/L (ref 22–32)
Calcium: 9.4 mg/dL (ref 8.9–10.3)
Chloride: 102 mmol/L (ref 98–111)
Creatinine, Ser: 0.7 mg/dL (ref 0.44–1.00)
GFR, Estimated: >60 mL/min (ref >60)
Glucose, Bld: 111 mg/dL — ABNORMAL HIGH (ref 70–99)
Potassium: 3.8 mmol/L (ref 3.5–5.1)
Sodium: 138 mmol/L (ref 135–145)
Total Bilirubin: 0.5 mg/dL (ref 0.3–1.2)
Total Protein: 7.3 g/dL (ref 6.5–8.1)

## 2022-01-09 LAB — LIPASE, BLOOD: Lipase: 14 U/L (ref 11–51)

## 2022-01-09 MED ORDER — IOHEXOL 9 MG/ML PO SOLN
500.0000 mL | Freq: Once | ORAL | Status: AC
  Administered 2022-01-09: 500 mL via ORAL

## 2022-01-09 MED ORDER — SODIUM CHLORIDE 0.9 % IV BOLUS
500.0000 mL | Freq: Once | INTRAVENOUS | Status: AC
  Administered 2022-01-09: 500 mL via INTRAVENOUS

## 2022-01-09 MED ORDER — IOHEXOL 300 MG/ML  SOLN
100.0000 mL | Freq: Once | INTRAMUSCULAR | Status: AC | PRN
  Administered 2022-01-09: 85 mL via INTRAVENOUS

## 2022-01-09 NOTE — ED Notes (Signed)
Discharge paperwork given and understood. 

## 2022-01-09 NOTE — Discharge Instructions (Signed)
Continue MiraLAX twice daily for the next week.  Recommend clear liquid diet for the next 3 days with gradual advancement to soft and then regular diet.  Call your primary care doctor or specialist as discussed in the next 2-3 days.   Return immediately back to the ER if:  Your symptoms worsen within the next 12-24 hours. You develop new symptoms such as new fevers, persistent vomiting, new pain, shortness of breath, or new weakness or numbness, or if you have any other concerns.

## 2022-01-09 NOTE — ED Triage Notes (Signed)
Pt. States she has been constipated since last Monday. Pt. States she has been taking medication for constipation and drinking prune juice and has not had a BM in 7 days. States had colon surgery 2 years ago. States pain on RUQ, 6/10. Pain does not radiate. Denies N/V. Hard of hearing.

## 2022-01-09 NOTE — ED Provider Notes (Signed)
[place] [facility] DEPT Provider Note   CSN: [id number] Arrival date & time: 01/09/22  0913     History  Chief Complaint  Patient presents with   Constipation    Marissa Caldwell is a 88 y.o. female.  Patient presents ER chief complaint of not having had a regular bowel movement for over a week.  She states that she has intermittent abdominal cramping pain in the right upper quadrant as well.  Currently denies pain at this time.  No associated fevers or vomiting.  She typically takes MiraLAX daily has increased to twice daily for the past 4 to 5 days.  She has tried some enemas without success as well.      Home Medications Prior to Admission medications   Medication Sig Start Date End Date Taking? Authorizing Provider  albuterol (PROVENTIL HFA;VENTOLIN HFA) 108 (90 Base) MCG/ACT inhaler Inhale 2 puffs into the lungs every 6 (six) hours as needed (for shortness of breath/wheezing.).     [provider]  aspirin EC 81 MG tablet Take 81 mg by mouth daily.    [provider]  brimonidine (ALPHAGAN) 0.2 % ophthalmic solution Place 1 drop into the left eye 2 (two) times daily.  05/06/19   [provider]  budesonide-formoterol (SYMBICORT) 160-4.5 MCG/ACT inhaler Inhale 2 puffs into the lungs 2 (two) times daily.    [provider]  Calcium Carb-Cholecalciferol (CALCIUM + D3) 600-200 MG-UNIT TABS Take 1 tablet by mouth daily.    [provider]  Cholecalciferol (VITAMIN D3) 2000 units capsule Take 4,000 Units by mouth daily.    [provider]  Coenzyme Q10 (COQ10) 100 MG CAPS Take 100 mg by mouth daily.    [provider]  gabapentin (NEURONTIN) 100 MG capsule Take 1 capsule (100 mg total) by mouth at bedtime. 12/22/21   Jessy Oto, MD  latanoprost (XALATAN) 0.005 % ophthalmic solution Place 1 drop into both eyes at bedtime.    [provider]  levothyroxine (SYNTHROID, LEVOTHROID) 100 MCG tablet  Take 100 mcg by mouth daily before breakfast.  05/17/18   [provider]  LUTEIN PO Take 1 tablet by mouth daily.    [provider]  meloxicam (MOBIC) 15 MG tablet Take 1 tablet (15 mg total) by mouth daily with breakfast. 12/22/21 01/21/22  Jessy Oto, MD  mirtazapine (REMERON) 15 MG tablet Take 15 mg by mouth at bedtime.    [provider]  Multiple Vitamin (MULTIVITAMIN) tablet Take 1 tablet by mouth daily.    [provider]  nitroGLYCERIN (NITROSTAT) 0.4 MG SL tablet Place 1 tablet (0.4 mg total) under the tongue every 5 (five) minutes as needed for chest pain. 01/01/20   Erlene Quan, PA-C  Omega-3 Fatty Acids (FISH OIL PO) Take 1 capsule by mouth daily.    [provider]  pravastatin (PRAVACHOL) 40 MG tablet TAKE 1 TABLET BY MOUTH DAILY GENERIC EQUIVALENT FOR PRAVACHOL 10/06/20   Skeet Latch, MD  tamoxifen (NOLVADEX) 20 MG tablet Take 1 tablet (20 mg total) by mouth daily. 06/21/21   [name], MD  TOVIAZ 4 MG TB24 tablet Take 4 mg by mouth daily.  05/20/18   [provider]  traMADol (ULTRAM) 50 MG tablet Take 0.5-1 tablets (25-50 mg total) by mouth every 8 (eight) hours as needed. 11/18/21   [name], MD      Allergies    Lipitor [atorvastatin]    Review of Systems   Review of  Systems  Constitutional:  Negative for fever.  HENT:  Negative for ear pain.   Eyes:  Negative for pain.  Respiratory:  Negative for cough.   Cardiovascular:  Negative for chest pain.  Gastrointestinal:  Positive for abdominal pain.  Genitourinary:  Negative for flank pain.  Musculoskeletal:  Negative for back pain.  Skin:  Negative for rash.  Neurological:  Negative for headaches.   Physical Exam Updated Vital Signs BP (!) 156/79   Pulse 72   Temp 98 F (36.7 C)   Resp 18   SpO2 100%  Physical Exam Constitutional:      General: She is not in acute distress.    Appearance: Normal appearance.  HENT:     Head: Normocephalic.     Nose:  Nose normal.  Eyes:     Extraocular Movements: Extraocular movements intact.  Cardiovascular:     Rate and Rhythm: Normal rate.  Pulmonary:     Effort: Pulmonary effort is normal.  Abdominal:     General: There is no distension.     Tenderness: There is no abdominal tenderness. There is no guarding or rebound.  Musculoskeletal:        General: Normal range of motion.     Cervical back: Normal range of motion.  Neurological:     General: No focal deficit present.     Mental Status: She is alert. Mental status is at baseline.    ED Results / Procedures / Treatments   Labs (all labs ordered are listed, but only abnormal results are displayed) Labs Reviewed  COMPREHENSIVE METABOLIC PANEL - Abnormal; Notable for the following components:      Result Value   Glucose, Bld 111 (*)    All other components within normal limits  CBC WITH DIFFERENTIAL/PLATELET  LIPASE, BLOOD    EKG None  Radiology CT ABDOMEN PELVIS W CONTRAST  Result Date: 01/09/2022 CLINICAL DATA:  Bowel obstruction suspected, right upper quadrant pain and constipation, no bowel movements for 7 days, history of diverticulitis, colon cancer, breast cancer * Tracking Code: BO * EXAM: CT ABDOMEN AND PELVIS WITH CONTRAST TECHNIQUE: Multidetector CT imaging of the abdomen and pelvis was performed using the standard protocol following bolus administration of intravenous contrast. RADIATION DOSE REDUCTION: This exam was performed according to the departmental dose-optimization program which includes automated exposure control, adjustment of the mA and/or kV according to patient size and/or use of iterative reconstruction technique. CONTRAST:  75m OMNIPAQUE IOHEXOL 300 MG/ML SOLN, additional oral enteric contrast COMPARISON:  PET-CT, 06/13/2021 FINDINGS: Lower chest: No acute abnormality. Coronary artery calcifications. Bandlike scarring of the bilateral lung bases. Hepatobiliary: No solid liver abnormality is seen. Stable, benign  hemangioma of the central liver (series 2, image 14). No gallstones, gallbladder wall thickening, or biliary dilatation. Pancreas: Unremarkable. No pancreatic ductal dilatation or surrounding inflammatory changes. Spleen: Normal in size without significant abnormality. Adrenals/Urinary Tract: Adrenal glands are unremarkable. Prominent bilateral extrarenal pelves kidneys are otherwise normal, without renal calculi, solid lesion, or hydronephrosis. Bladder is unremarkable. Stomach/Bowel: Stomach is within normal limits. Status post right hemicolectomy and ileocolic anastomosis. No evidence of bowel wall thickening, distention, or inflammatory changes. Sigmoid diverticula. Vascular/Lymphatic: Aortic atherosclerosis. No enlarged abdominal or pelvic lymph nodes. Reproductive: Calcified uterine fibroids. Other: No abdominal wall hernia or abnormality. No ascites. Musculoskeletal: No acute or significant osseous findings. IMPRESSION: 1. No acute CT findings of the abdomen or pelvis to explain right upper quadrant pain. 2. Status post right hemicolectomy and ileocolic anastomosis. No evidence of  bowel obstruction. No large burden of stool in the colon. 3. Sigmoid diverticulosis without evidence of acute diverticulitis. 4. No evidence of lymphadenopathy or metastatic disease in the abdomen. 5. Coronary artery disease. Aortic Atherosclerosis (ICD10-I70.0). Electronically Signed   By: Delanna Ahmadi M.D.   On: 01/09/2022 11:29    Procedures Procedures    Medications Ordered in ED Medications  sodium chloride 0.9 % bolus 500 mL (500 mLs Intravenous New Bag/Given 01/09/22 0959)  iohexol (OMNIPAQUE) 9 MG/ML oral solution 500 mL (500 mLs Oral Contrast Given 01/09/22 1016)  iohexol (OMNIPAQUE) 300 MG/ML solution 100 mL (85 mLs Intravenous Contrast Given 01/09/22 1107)    ED Course/ Medical Decision Making/ A&P                           Medical Decision Making Amount and/or Complexity of Data Reviewed Labs:  ordered. Radiology: ordered.  Risk Prescription drug management.   History obtained from family bedside.  Cardiac monitor shows sinus rhythm.  Review of records shows office visit Dec 22, 2021 for spinal stenosis.  Work-up today is unremarkable white count 6 hemoglobin 13 chemistry unremarkable liver enzymes are normal T. bili is normal.  CT abdomen pelvis pursued with oral contrast, with no acute findings noted.  Postsurgical changes noted.  No evidence of large stool burden per radiologist.  Rectal exam is also benign with no stool seen in the rectal vault.  Patient's abdominal exam remains benign no guarding or tenderness.  Recommending continue MiraLAX twice daily, recommending clear liquid diet for the next 3 to 4 days.  Advised outpatient follow-up with her primary care doctor or gastroenterologist this week.  Recommending immediate return if she has vomiting pain fevers or any additional concerns.        Final Clinical Impression(s) / ED Diagnoses Final diagnoses:  Constipation, unspecified constipation type    Rx / DC Orders ED Discharge Orders     None         Luna Fuse, MD 01/09/22 1142

## 2022-01-18 DIAGNOSIS — M810 Age-related osteoporosis without current pathological fracture: Secondary | ICD-10-CM | POA: Diagnosis not present

## 2022-01-29 ENCOUNTER — Other Ambulatory Visit: Payer: Self-pay | Admitting: Hematology

## 2022-01-30 ENCOUNTER — Other Ambulatory Visit: Payer: Self-pay | Admitting: Hematology

## 2022-02-01 NOTE — Telephone Encounter (Signed)
Dr. Burr Medico stated she refilled last time for pt's back pain and told the pt to f/u with her PCP for that, Dr. Burr Medico does not plan to refill anymore

## 2022-02-24 ENCOUNTER — Other Ambulatory Visit: Payer: Self-pay | Admitting: Specialist

## 2022-03-03 NOTE — Progress Notes (Signed)
[facility] Note  03/06/2022     CHIEF COMPLAINT Patient presents for Retina Follow Up    HISTORY OF PRESENT ILLNESS: Marissa Caldwell is a 88 y.o. female who presents to the clinic today for:   HPI     Retina Follow Up   Patient presents with  Wet AMD.  In right eye.  Severity is moderate.  Duration of 6 months.  Since onset it is stable.  I, the attending physician,  performed the HPI with the patient and updated documentation appropriately.        Comments   Patient states vision the same OU.       Last edited by Bernarda Caffey, MD on 03/06/2022 11:10 PM.    Pt states vision seems about the same, she is taking AREDS 2   Referring physician:  [name], MD [place],  [place] [address]  HISTORICAL INFORMATION:   Selected notes from the MEDICAL RECORD NUMBER Referred by Dr. Quentin Ore for concern of exudative ARMD OD   CURRENT MEDICATIONS: Current Outpatient Medications (Ophthalmic Drugs)  Medication Sig   brimonidine (ALPHAGAN) 0.2 % ophthalmic solution Place 1 drop into the left eye 2 (two) times daily.    latanoprost (XALATAN) 0.005 % ophthalmic solution Place 1 drop into both eyes at bedtime.   Current Facility-Administered Medications (Ophthalmic Drugs)  Medication Route   aflibercept (EYLEA) SOLN 2 mg Intravitreal   aflibercept (EYLEA) SOLN 2 mg Intravitreal   aflibercept (EYLEA) SOLN 2 mg Intravitreal   aflibercept (EYLEA) SOLN 2 mg Intravitreal   aflibercept (EYLEA) SOLN 2 mg Intravitreal   Current Outpatient Medications (Other)  Medication Sig   albuterol (PROVENTIL HFA;VENTOLIN HFA) 108 (90 Base) MCG/ACT inhaler Inhale 2 puffs into the lungs every 6 (six) hours as needed (for shortness of breath/wheezing.).    aspirin EC 81 MG tablet Take 81 mg by mouth daily.   budesonide-formoterol (SYMBICORT) 160-4.5 MCG/ACT inhaler Inhale 2 puffs into the lungs 2 (two) times daily.   Calcium  Carb-Cholecalciferol (CALCIUM + D3) 600-200 MG-UNIT TABS Take 1 tablet by mouth daily.   Cholecalciferol (VITAMIN D3) 2000 units capsule Take 4,000 Units by mouth daily.   Coenzyme Q10 (COQ10) 100 MG CAPS Take 100 mg by mouth daily.   gabapentin (NEURONTIN) 100 MG capsule Take 1 capsule (100 mg total) by mouth at bedtime.   levothyroxine (SYNTHROID, LEVOTHROID) 100 MCG tablet Take 100 mcg by mouth daily before breakfast.    LUTEIN PO Take 1 tablet by mouth daily.   meloxicam (MOBIC) 15 MG tablet Take 1 tablet by mouth once daily with breakfast   mirtazapine (REMERON) 15 MG tablet Take 15 mg by mouth at bedtime.   Multiple Vitamin (MULTIVITAMIN) tablet Take 1 tablet by mouth daily.   nitroGLYCERIN (NITROSTAT) 0.4 MG SL tablet Place 1 tablet (0.4 mg total) under the tongue every 5 (five) minutes as needed for chest pain.   Omega-3 Fatty Acids (FISH OIL PO) Take 1 capsule by mouth daily.   pravastatin (PRAVACHOL) 40 MG tablet TAKE 1 TABLET BY MOUTH DAILY GENERIC EQUIVALENT FOR PRAVACHOL   tamoxifen (NOLVADEX) 20 MG tablet Take 1 tablet by mouth once daily   TOVIAZ 4 MG TB24 tablet Take 4 mg by mouth daily.    traMADol (ULTRAM) 50 MG tablet Take 0.5-1 tablets (25-50 mg total) by mouth every 8 (eight) hours as needed.   Current Facility-Administered Medications (Other)  Medication Route   Bevacizumab (  AVASTIN) SOLN 1.25 mg Intravitreal   Bevacizumab (AVASTIN) SOLN 1.25 mg Intravitreal   Bevacizumab (AVASTIN) SOLN 1.25 mg Intravitreal   REVIEW OF SYSTEMS: ROS   Positive for: HENT, Endocrine, Cardiovascular, Eyes, Respiratory, Heme/Lymph Negative for: Constitutional, Gastrointestinal, Neurological, Skin, Genitourinary, Musculoskeletal, Psychiatric, Allergic/Imm Last edited by Roselee Nova D, COT on 03/06/2022 12:47 PM.     ALLERGIES Allergies  Allergen Reactions   Lipitor [Atorvastatin]     Elevated LFT's   PAST MEDICAL HISTORY Past Medical History:  Diagnosis Date   Anxiety     Arthritis    Breast cancer ([place])    CAD (coronary artery disease)    a. 03/2017: 95% LCx stenosis --> PCI/DES placed   Cataract    Chest pain 03/27/2017   Chronic diastolic heart failure ([place]) 06/25/2017   Chronic lower back pain    Colon cancer ([place]) 11/2019   s/p colectomy   COPD (chronic obstructive pulmonary disease) ([place])    Coronary artery disease    Diverticulitis    Diverticulosis    GERD (gastroesophageal reflux disease)    Glaucoma    Heart failure, type unknown ([place]) 03/11/2017   History of radiation therapy 08/05/19- 09/03/19   Right Breast/ SCV 16 fractions of 2.66 Gy each for a total of 42.56 Gy. Right breast boost 4 fractions of 2 Gy each to total 8 Gy.    Hyperlipidemia 03/11/2017   Hypertension    Hypothyroidism    Macular degeneration    wet and dry per pt's son   Personal history of chemotherapy    Personal history of radiation therapy    Pneumonia X 1   "years and years ago" (03/26/2017)   Shortness of breath 03/11/2017   Status post dilation of esophageal narrowing    Past Surgical History:  Procedure Laterality Date   BILATERAL OOPHORECTOMY Bilateral    BREAST LUMPECTOMY Right 06/19/2019   BREAST LUMPECTOMY WITH RADIOACTIVE SEED AND SENTINEL LYMPH NODE BIOPSY Right 06/19/2019   Procedure: RIGHT BREAST LUMPECTOMY WITH RADIOACTIVE SEED AND SENTINEL LYMPH NODE BIOPSY;  Surgeon: Jovita Kussmaul, MD;  Location: [facility];  Service: General;  Laterality: Right;   CATARACT EXTRACTION     CATARACT EXTRACTION W/ INTRAOCULAR LENS  IMPLANT, BILATERAL Bilateral    COLOSTOMY     Greater than 10 yrs   CORONARY ANGIOPLASTY WITH STENT PLACEMENT  03/26/2017   LAPAROSCOPIC RIGHT COLECTOMY Right 12/15/2019   Procedure: LAPAROSCOPIC ASSISTED RIGHT COLECTOMY;  Surgeon: Jovita Kussmaul, MD;  Location: [facility];  Service: General;  Laterality: Right;   LEFT HEART CATH AND CORONARY ANGIOGRAPHY N/A 03/26/2017   Procedure: Left Heart Cath and Coronary Angiography;  Surgeon: Belva Crome, MD;   Location: [facility];  Service: Cardiovascular;  Laterality: N/A;   NERVE SURGERY     "lump on my sciatic nerve was removed years ago"   SHOULDER OPEN ROTATOR CUFF REPAIR Right    THYROIDECTOMY, PARTIAL     TONSILLECTOMY     UPPER GASTROINTESTINAL ENDOSCOPY     > ten yrs    FAMILY HISTORY Family History  Problem Relation Age of Onset   CAD Mother    Cataracts Mother    Coronary artery disease Father    Leukemia Sister    Amblyopia Neg Hx    Blindness Neg Hx    Diabetes Neg Hx    Glaucoma Neg Hx    Macular degeneration Neg Hx    Retinal detachment Neg Hx    Strabismus Neg Hx  Retinitis pigmentosa Neg Hx    Colon cancer Neg Hx    Colon polyps Neg Hx    Esophageal cancer Neg Hx    Rectal cancer Neg Hx    Stomach cancer Neg Hx    SOCIAL HISTORY Social History   Tobacco Use   Smoking status: Former    Packs/day: 0.50    Years: 60.00    Total pack years: 30.00    Types: Cigarettes   Smokeless tobacco: Never  Vaping Use   Vaping Use: Never used  Substance Use Topics   Alcohol use: Yes    Comment: twice a month   Drug use: No       OPHTHALMIC EXAM:  Base Eye Exam     Visual Acuity (Snellen - Linear)       Right Left   Dist [place] CF at 2' 20/100 -2   Dist ph [place] NI NI    Correction: Glasses         Tonometry (Tonopen, 12:57 PM)       Right Left   Pressure 10 09         Pupils       Dark Light Shape React APD   Right 4 4 Round Minimal +2   Left 4 3 Round Brisk None         Visual Fields (Counting fingers)       Left Right    Full Full         Extraocular Movement       Right Left    Full, Ortho Full, Ortho         Neuro/Psych     Oriented x3: Yes   Mood/Affect: Normal         Dilation     Both eyes: 1.0% Mydriacyl, 2.5% Phenylephrine @ 12:58 PM           Slit Lamp and Fundus Exam     Slit Lamp Exam       Right Left   Lids/Lashes Dermatochalasis - upper lid, Telangiectasia, Meibomian gland dysfunction -  improving Dermatochalasis - upper lid, Telangiectasia   Conjunctiva/Sclera White and quiet trace Injection   Cornea Arcus, 1+ Punctate epithelial erosions, 3+ fine endo pigment, tear film debris Arcus, trace Punctate epithelial erosions, endo pigment / Krunkenberg's spindle, mild central haze   Anterior Chamber Deep and quiet Deep and quiet   Iris Round and dilated Round and dilated   Lens Three piece PC IOL in good position, open PC Three piece PC IOL in good position, clear PC   Anterior Vitreous Vitreous syneresis, Posterior vitreous detachment, mild vitreous condensations Mild Vitreous syneresis, Posterior vitreous detachment         Fundus Exam       Right Left   Disc sharp rim, 360 Peripapillary atrophy, 2-3+Pallor 360 Peripapillary atrophy, 1-2+pallor, sharp rim   C/D Ratio 0.3 0.1   Macula Blunted foveal reflex, Drusen, RPE mottling, clumping and atrophy, +PED, +CNVM, sub-retinal central Disciform scar with subretinal fibrosis, no heme Blunted foveal reflex, Drusen, RPE mottling, clumping, and atrophy, focal GA nasal macula extending into fovea, no heme   Vessels attenuated, mild tortuosity attenuated, mild tortuosity   Periphery Attached, reticular degeneration, No heme Attached, reticular degeneration           Refraction     Wearing Rx       Sphere Cylinder Axis   Right -1.00 +1.50 014   Left -1.25 +1.25 174  Manifest Refraction       Sphere Cylinder Axis Dist VA   Right       Left -1.25 +1.25 180 20/100-           IMAGING AND PROCEDURES  Imaging and Procedures for '@TODAY'$ @  OCT, Retina - OU - Both Eyes       Right Eye Quality was good. Central Foveal Thickness: 341. Progression has been stable. Findings include no IRF, no SRF, abnormal foveal contour, retinal drusen , subretinal hyper-reflective material, disciform scar, epiretinal membrane, pigment epithelial detachment, outer retinal atrophy (persistent thick PED/SRHM/disciform scar -- stable  from prior).   Left Eye Quality was good. Central Foveal Thickness: 232. Progression has been stable. Findings include no IRF, no SRF, abnormal foveal contour, retinal drusen , pigment epithelial detachment, outer retinal atrophy (Significant central GA / ORA, ?progression of ORA).   Notes *Images captured and stored on drive  Diagnosis / Impression:  OD: Exudative AMD with significant submacular scar/SRHM --  persistent thick PED/SRHM/disciform scar -- no IRF/SRF OS: Non-exudative AMD w/ Significant central GA / ORA, ?progression of ORA  Clinical management:  See below  Abbreviations: NFP - Normal foveal profile. CME - cystoid macular edema. PED - pigment epithelial detachment. IRF - intraretinal fluid. SRF - subretinal fluid. EZ - ellipsoid zone. ERM - epiretinal membrane. ORA - outer retinal atrophy. ORT - outer retinal tubulation. SRHM - subretinal hyper-reflective material             ASSESSMENT/PLAN:    ICD-10-CM   1. 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 Intermediate stage nonexudative age-related macular degeneration of left eye  H35.3122 OCT, Retina - OU - Both Eyes    3. Posterior vitreous detachment of both eyes  H43.813     4. Pigmentary glaucoma of both eyes, mild stage  H40.1331     5. Pseudophakia of both eyes  Z96.1     6. Dry eyes  H04.123      1. 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 Kathlen Mody  - S/P IVA OD #1 (05.20.19), #2 (07.02.19), #3 (07.30.19)  - S/P IVE OD #1 (08.27.19), #2 (09.24.19), #3 (11.05.19), #4 (12.17.19), #5 (02.21.20), #6 (05.05.20), # 7 (08.05.20), #8 (9.2.20), #9 (11.10.20), #10 (02.12.21), #11 (05.12.21), #12 (08.13.21)  - VA remains CF  - OCT with stable improvment in peripapillary / nasal macula IRF and SRF; persistent thick PED/SRHM/disciform scar centrally  -  discussed findings and guarded prognosis and treatment options  - due to stable, inactive submacular scar and VA CF -- recommend holding off on injection today  - pt in agreement  - Eylea informed consent form signed and scanned on 11.10.2020  - Eylea paperwork and benefits investigation started on 07.30.19  - f/u in 6 months -- DFE/OCT  2. Age related macular degeneration, non-exudative, left eye  - mild progression of geographic atrophy on exam and OCT today  - BCVA 20/100 from 20/80  - The incidence, anatomy, and pathology of dry AMD, risk of progression, and the AREDS and AREDS 2 study including smoking risks discussed with patient.  - continue Amsler grid monitoring  3. PVD / vitreous syneresis OU  Discussed findings and prognosis  No RT or RD on 360 peripheral exam  Reviewed s/s of RT/RD  Strict return precautions for any such RT/RD  symptoms  4. Pigmentary glaucoma OU-   - using latanoprost OU qhs / brimonidine BID OU  - IOP good today -- 10,9  - monitor  5. Pseudophakia OU  - s/p CE/IOL OU  - beautiful surgery, doing well  - monitor  6. Dry eyes OU  - recommend artificial tears and lubricating ointment as needed  Ophthalmic Meds Ordered this visit:  No orders of the defined types were placed in this encounter.    Return in about 6 months (around 09/06/2022) for f/u exu ARMD OD, DFE, OCT.  There are no Patient Instructions on file for this visit.  This document serves as a record of services personally performed by Gardiner Sleeper, MD, PhD. It was created on their behalf by Roselee Nova, COMT. The creation of this record is the provider's dictation and/or activities during the visit.  Electronically signed by: Roselee Nova, COMT 03/06/22 11:12 PM  This document serves as a record of services personally performed by Gardiner Sleeper, MD, PhD. It was created on their behalf by San Jetty. Owens Shark, OA an ophthalmic technician. The creation of this record is the provider's  dictation and/or activities during the visit.    Electronically signed by: San Jetty. Owens Shark, [place] 07.17.2023 11:12 PM   Gardiner Sleeper, M.D., Ph.D. Diseases & Surgery of the Retina and Vitreous [facility]  I have reviewed the above documentation for accuracy and completeness, and I agree with the above. Gardiner Sleeper, M.D., Ph.D. 03/06/22 11:17 PM   Abbreviations: M myopia (nearsighted); A astigmatism; H hyperopia (farsighted); P presbyopia; Mrx spectacle prescription;  CTL contact lenses; OD right eye; OS left eye; OU both eyes  XT exotropia; ET esotropia; PEK punctate epithelial keratitis; PEE punctate epithelial erosions; DES dry eye syndrome; MGD meibomian gland dysfunction; ATs artificial tears; PFAT's preservative free artificial tears; [place] nuclear sclerotic cataract; PSC posterior subcapsular cataract; ERM epi-retinal membrane; PVD posterior vitreous detachment; RD retinal detachment; DM diabetes mellitus; DR diabetic retinopathy; NPDR non-proliferative diabetic retinopathy; PDR proliferative diabetic retinopathy; CSME clinically significant macular edema; DME diabetic macular edema; dbh dot blot hemorrhages; CWS cotton wool spot; POAG primary open angle glaucoma; C/D cup-to-disc ratio; HVF humphrey visual field; GVF goldmann visual field; OCT optical coherence tomography; IOP intraocular pressure; BRVO Branch retinal vein occlusion; CRVO central retinal vein occlusion; CRAO central retinal artery occlusion; BRAO branch retinal artery occlusion; RT retinal tear; SB scleral buckle; PPV pars plana vitrectomy; VH Vitreous hemorrhage; PRP panretinal laser photocoagulation; IVK intravitreal kenalog; VMT vitreomacular traction; MH Macular hole;  NVD neovascularization of the disc; NVE neovascularization elsewhere; AREDS age related eye disease study; ARMD age related macular degeneration; POAG primary open angle glaucoma; EBMD epithelial/anterior basement membrane dystrophy;  ACIOL anterior chamber intraocular lens; IOL intraocular lens; PCIOL posterior chamber intraocular lens; Phaco/IOL phacoemulsification with intraocular lens placement; Nolan photorefractive keratectomy; LASIK laser assisted in situ keratomileusis; HTN hypertension; DM diabetes mellitus; COPD chronic obstructive pulmonary disease

## 2022-03-06 ENCOUNTER — Ambulatory Visit (INDEPENDENT_AMBULATORY_CARE_PROVIDER_SITE_OTHER): Payer: Medicare Other | Admitting: Ophthalmology

## 2022-03-06 ENCOUNTER — Encounter (INDEPENDENT_AMBULATORY_CARE_PROVIDER_SITE_OTHER): Payer: Self-pay | Admitting: Ophthalmology

## 2022-03-06 DIAGNOSIS — H401331 Pigmentary glaucoma, bilateral, mild stage: Secondary | ICD-10-CM | POA: Diagnosis not present

## 2022-03-06 DIAGNOSIS — H353213 Exudative age-related macular degeneration, right eye, with inactive scar: Secondary | ICD-10-CM

## 2022-03-06 DIAGNOSIS — H43813 Vitreous degeneration, bilateral: Secondary | ICD-10-CM

## 2022-03-06 DIAGNOSIS — H04123 Dry eye syndrome of bilateral lacrimal glands: Secondary | ICD-10-CM

## 2022-03-06 DIAGNOSIS — Z961 Presence of intraocular lens: Secondary | ICD-10-CM

## 2022-03-06 DIAGNOSIS — H353122 Nonexudative age-related macular degeneration, left eye, intermediate dry stage: Secondary | ICD-10-CM

## 2022-03-09 ENCOUNTER — Ambulatory Visit: Payer: Medicare Other | Admitting: Specialist

## 2022-03-21 ENCOUNTER — Other Ambulatory Visit: Payer: Self-pay | Admitting: Specialist

## 2022-04-06 DIAGNOSIS — M48061 Spinal stenosis, lumbar region without neurogenic claudication: Secondary | ICD-10-CM | POA: Diagnosis not present

## 2022-04-06 DIAGNOSIS — E039 Hypothyroidism, unspecified: Secondary | ICD-10-CM | POA: Diagnosis not present

## 2022-04-06 DIAGNOSIS — E441 Mild protein-calorie malnutrition: Secondary | ICD-10-CM | POA: Diagnosis not present

## 2022-04-06 DIAGNOSIS — F419 Anxiety disorder, unspecified: Secondary | ICD-10-CM | POA: Diagnosis not present

## 2022-04-13 ENCOUNTER — Ambulatory Visit
Admission: RE | Admit: 2022-04-13 | Discharge: 2022-04-13 | Disposition: A | Discharge status: Home / Self Care | Payer: Medicare Other | Source: Ambulatory Visit | Attending: Hematology | Admitting: Hematology

## 2022-04-13 DIAGNOSIS — Z1231 Encounter for screening mammogram for malignant neoplasm of breast: Secondary | ICD-10-CM

## 2022-04-14 ENCOUNTER — Ambulatory Visit: Payer: Medicare Other | Admitting: Specialist

## 2022-04-14 VITALS — BP 108/68 | HR 76 | Ht 70.0 in | Wt 149.5 lb

## 2022-04-14 DIAGNOSIS — M47815 Spondylosis without myelopathy or radiculopathy, thoracolumbar region: Secondary | ICD-10-CM | POA: Diagnosis not present

## 2022-04-14 DIAGNOSIS — M4150 Other secondary scoliosis, site unspecified: Secondary | ICD-10-CM | POA: Diagnosis not present

## 2022-04-14 DIAGNOSIS — M48062 Spinal stenosis, lumbar region with neurogenic claudication: Secondary | ICD-10-CM

## 2022-04-14 DIAGNOSIS — M5136 Other intervertebral disc degeneration, lumbar region: Secondary | ICD-10-CM | POA: Diagnosis not present

## 2022-04-14 MED ORDER — DICLOFENAC POTASSIUM 50 MG PO TABS: 50.0000 mg | ORAL_TABLET | Freq: Every day | ORAL | 3 refills | Status: AC

## 2022-04-14 NOTE — Patient Instructions (Signed)
Avoid frequent bending and stooping  No lifting greater than 10 lbs. May use ice or moist heat for pain. Weight loss is of benefit. Best medication for lumbar disc disease is arthritis medications like motrin, celebrex and naprosyn. I will stop the meloxicam you are taking and start low dose cataflam 50 mg once a day with a meal or snack Therapy to improve core muscle strength and trial TENS (transcutaneous electrical nerve stimulation) for pain control Tramadol for more severe pain. Exercise is important to improve your indurance and does allow people to function better inspite of back pain.

## 2022-04-14 NOTE — Progress Notes (Signed)
Office Visit Note   Patient: Marissa Caldwell           Date of Birth: [birth year]           MRN: [id number] Visit Date: 04/14/2022              Requested by: [name], MD [address],  [place] [address] PCP: [name], MD   Assessment & Plan: Visit Diagnoses:  1. Spinal stenosis of lumbar region with neurogenic claudication   2. Scoliosis due to degenerative disease of spine in adult patient   3. 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 Degenerative disc disease, lumbar     Plan: Avoid frequent bending and stooping  No lifting greater than 10 lbs. May use ice or moist heat for pain. Weight loss is of benefit. Best medication for lumbar disc disease is arthritis medications like motrin, celebrex and naprosyn. I will stop the meloxicam you are taking and start low dose cataflam 50 mg once a day with a meal or snack Therapy to improve core muscle strength and trial TENS (transcutaneous electrical nerve stimulation) for pain control Tramadol for more severe pain. Exercise is important to improve your indurance and does allow people to function better inspite of back pain.  Follow-Up Instructions: No follow-ups on file.   Orders:  Orders Placed This Encounter  Procedures   Ambulatory referral to Physical Therapy   Meds ordered this encounter  Medications   diclofenac (CATAFLAM) 50 MG tablet    Sig: Take 1 tablet (50 mg total) by mouth daily after breakfast.    Dispense:  30 tablet    Refill:  3      Procedures: No procedures performed   Clinical Data: No additional findings.   Subjective: No chief complaint on file.   HPI  Review of Systems   Objective: Vital Signs: BP 108/68 (BP Location: Left Arm, Patient Position: Sitting, Cuff Size: Large)   Pulse 76   Ht '5\' 10"'$  (1.778 m)   Wt 149 lb 8 oz (67.8 kg)   BMI 21.45 kg/m   Physical Exam  Ortho Exam  Specialty Comments:  No specialty  comments available.  Imaging: No results found.   PMFS History: Patient Active Problem List   Diagnosis Date Noted   Malnutrition of moderate degree 12/22/2019   HOH (hard of hearing) 12/21/2019   Cancer of right colon ([place]) 12/15/2019   Malignant neoplasm of upper-outer quadrant of right breast in female, estrogen receptor positive ([place]) 04/03/2019   Chronic diastolic heart failure ([place]) 06/25/2017   COPD (chronic obstructive pulmonary disease) ([place]) 04/13/2017   Chest pain 03/27/2017   CAD S/P percutaneous coronary angioplasty 03/26/2017   Abnormal nuclear stress test 03/25/2017   Dyslipidemia 03/11/2017   Hypothyroidism 03/11/2017   Shortness of breath 03/11/2017   Past Medical History:  Diagnosis Date   Anxiety    Arthritis    Breast cancer ([place])    CAD (coronary artery disease)    a. 03/2017: 95% LCx stenosis --> PCI/DES placed   Cataract    Chest pain 03/27/2017   Chronic diastolic heart failure ([place]) 06/25/2017   Chronic lower back pain    Colon cancer ([place]) 11/2019   s/p colectomy   COPD (chronic obstructive pulmonary disease) ([facility])    Coronary artery disease    Diverticulitis    Diverticulosis    GERD (gastroesophageal reflux disease)    Glaucoma    Heart failure,  type unknown ([place]) 03/11/2017   History of radiation therapy 08/05/19- 09/03/19   Right Breast/ SCV 16 fractions of 2.66 Gy each for a total of 42.56 Gy. Right breast boost 4 fractions of 2 Gy each to total 8 Gy.    Hyperlipidemia 03/11/2017   Hypertension    Hypothyroidism    Macular degeneration    wet and dry per pt's son   Personal history of chemotherapy    Personal history of radiation therapy    Pneumonia X 1   "years and years ago" (03/26/2017)   Shortness of breath 03/11/2017   Status post dilation of esophageal narrowing     Family History  Problem Relation Age of Onset   CAD Mother    Cataracts Mother    Coronary artery disease Father    Leukemia Sister    Amblyopia Neg Hx     Blindness Neg Hx    Diabetes Neg Hx    Glaucoma Neg Hx    Macular degeneration Neg Hx    Retinal detachment Neg Hx    Strabismus Neg Hx    Retinitis pigmentosa Neg Hx    Colon cancer Neg Hx    Colon polyps Neg Hx    Esophageal cancer Neg Hx    Rectal cancer Neg Hx    Stomach cancer Neg Hx     Past Surgical History:  Procedure Laterality Date   BILATERAL OOPHORECTOMY Bilateral    BREAST LUMPECTOMY Right 06/19/2019   BREAST LUMPECTOMY WITH RADIOACTIVE SEED AND SENTINEL LYMPH NODE BIOPSY Right 06/19/2019   Procedure: RIGHT BREAST LUMPECTOMY WITH RADIOACTIVE SEED AND SENTINEL LYMPH NODE BIOPSY;  Surgeon: Jovita Kussmaul, MD;  Location: [facility];  Service: General;  Laterality: Right;   CATARACT EXTRACTION     CATARACT EXTRACTION W/ INTRAOCULAR LENS  IMPLANT, BILATERAL Bilateral    COLOSTOMY     Greater than 10 yrs   CORONARY ANGIOPLASTY WITH STENT PLACEMENT  03/26/2017   LAPAROSCOPIC RIGHT COLECTOMY Right 12/15/2019   Procedure: LAPAROSCOPIC ASSISTED RIGHT COLECTOMY;  Surgeon: Jovita Kussmaul, MD;  Location: [facility];  Service: General;  Laterality: Right;   LEFT HEART CATH AND CORONARY ANGIOGRAPHY N/A 03/26/2017   Procedure: Left Heart Cath and Coronary Angiography;  Surgeon: Belva Crome, MD;  Location: [facility];  Service: Cardiovascular;  Laterality: N/A;   NERVE SURGERY     "lump on my sciatic nerve was removed years ago"   SHOULDER OPEN ROTATOR CUFF REPAIR Right    THYROIDECTOMY, PARTIAL     TONSILLECTOMY     UPPER GASTROINTESTINAL ENDOSCOPY     > ten yrs   Social History   Occupational History   Occupation: retired  Tobacco Use   Smoking status: Former    Packs/day: 0.50    Years: 60.00    Total pack years: 30.00    Types: Cigarettes   Smokeless tobacco: Never  Vaping Use   Vaping Use: Never used  Substance and Sexual Activity   Alcohol use: Yes    Comment: twice a month   Drug use: No   Sexual activity: Not Currently

## 2022-04-27 ENCOUNTER — Other Ambulatory Visit: Payer: Self-pay | Admitting: Hematology

## 2022-05-15 ENCOUNTER — Ambulatory Visit: Payer: Medicare Other | Admitting: Specialist

## 2022-05-15 ENCOUNTER — Encounter: Payer: Self-pay | Admitting: Specialist

## 2022-05-15 VITALS — BP 143/81 | HR 71 | Ht 70.0 in | Wt 140.0 lb

## 2022-05-15 DIAGNOSIS — M4150 Other secondary scoliosis, site unspecified: Secondary | ICD-10-CM | POA: Diagnosis not present

## 2022-05-15 DIAGNOSIS — M4726 Other spondylosis with radiculopathy, lumbar region: Secondary | ICD-10-CM

## 2022-05-15 DIAGNOSIS — I70213 Atherosclerosis of native arteries of extremities with intermittent claudication, bilateral legs: Secondary | ICD-10-CM | POA: Diagnosis not present

## 2022-05-15 DIAGNOSIS — M48062 Spinal stenosis, lumbar region with neurogenic claudication: Secondary | ICD-10-CM | POA: Diagnosis not present

## 2022-05-15 DIAGNOSIS — M47815 Spondylosis without myelopathy or radiculopathy, thoracolumbar region: Secondary | ICD-10-CM | POA: Diagnosis not present

## 2022-05-15 DIAGNOSIS — M5136 Other intervertebral disc degeneration, lumbar region: Secondary | ICD-10-CM

## 2022-05-15 NOTE — Progress Notes (Signed)
Office Visit Note   Patient: Marissa Caldwell           Date of Birth: October 09, 1931           MRN: [id number] Visit Date: 05/15/2022              Requested by: [name], MD [place],  [place] [address] PCP: [name], MD   Assessment & Plan: Visit Diagnoses: No diagnosis found.  Plan: Avoid bending, stooping and avoid lifting weights greater than 10 lbs. Avoid prolong standing and walking. Avoid frequent bending and stooping  No lifting greater than 10 lbs. May use ice or moist heat for pain. Weight loss is of benefit. Handicap license is approved. Cataflam 50 mg po daily for arthritis pain   Yoga or stretching exercises or pool exercise program.    Follow-Up Instructions: Return in about 3 months (around 08/14/2022).   Orders:  No orders of the defined types were placed in this encounter.  No orders of the defined types were placed in this encounter.     Procedures: No procedures performed   Clinical Data: No additional findings.   Subjective: Chief Complaint  Patient presents with  . Lower Back - Follow-up    88 year old female with spondylosis and DDD with collapsing scoliosis. She is taking cataflam 50 mg daily and gabapentin 100 mg at night. She reports that knee pain and swelling is better and left shoulder pain is better and she is able to lie on the shoulder. Low back pain is also improved.   Review of Systems  Constitutional: Negative.   HENT: Negative.    Eyes: Negative.   Respiratory: Negative.    Cardiovascular: Negative.   Gastrointestinal: Negative.   Endocrine: Negative.   Genitourinary: Negative.   Musculoskeletal: Negative.   Skin: Negative.   Allergic/Immunologic: Negative.   Neurological: Negative.   Hematological: Negative.   Psychiatric/Behavioral: Negative.      Objective: Vital Signs: BP (!) 143/81   Pulse 71   Physical Exam Constitutional:      Appearance: She is well-developed.   HENT:     Head: Normocephalic and atraumatic.  Eyes:     Pupils: Pupils are equal, round, and reactive to light.  Pulmonary:     Effort: Pulmonary effort is normal.     Breath sounds: Normal breath sounds.  Abdominal:     General: Bowel sounds are normal.     Palpations: Abdomen is soft.  Musculoskeletal:     Cervical back: Normal range of motion and neck supple.     Lumbar back: Negative right straight leg raise test and negative left straight leg raise test.  Skin:    General: Skin is warm and dry.  Neurological:     Mental Status: She is alert and oriented to person, place, and time.  Psychiatric:        Behavior: Behavior normal.        Thought Content: Thought content normal.        Judgment: Judgment normal.   Back Exam   Tenderness  The patient is experiencing tenderness in the lumbar.  Range of Motion  Extension:  abnormal  Flexion:  abnormal  Lateral bend right:  abnormal  Lateral bend left:  abnormal  Rotation right:  abnormal  Rotation left:  abnormal   Muscle Strength  Right Quadriceps:  5/5  Left Quadriceps:  5/5  Right Hamstrings:  5/5  Left Hamstrings:  5/5  Tests  Straight leg raise right: negative Straight leg raise left: negative  Reflexes  Achilles:  0/4  Other  Toe walk: normal Heel walk: normal Sensation: normal Gait: normal     Specialty Comments:  No specialty comments available.  Imaging: No results found.   PMFS History: Patient Active Problem List   Diagnosis Date Noted  . Malnutrition of moderate degree 12/22/2019  . HOH (hard of hearing) 12/21/2019  . Cancer of right colon ([facility]) 12/15/2019  . Malignant neoplasm of upper-outer quadrant of right breast in female, estrogen receptor positive ([facility]) 04/03/2019  . Chronic diastolic heart failure ([facility]) 06/25/2017  . COPD (chronic obstructive pulmonary disease) ([facility]) 04/13/2017  . Chest pain 03/27/2017  . CAD S/P percutaneous coronary angioplasty 03/26/2017  . Abnormal  nuclear stress test 03/25/2017  . Dyslipidemia 03/11/2017  . Hypothyroidism 03/11/2017  . Shortness of breath 03/11/2017   Past Medical History:  Diagnosis Date  . Anxiety   . Arthritis   . Breast cancer ([facility])   . CAD (coronary artery disease)    a. 03/2017: 95% LCx stenosis --> PCI/DES placed  . Cataract   . Chest pain 03/27/2017  . Chronic diastolic heart failure ([facility]) 06/25/2017  . Chronic lower back pain   . Colon cancer ([facility]) 11/2019   s/p colectomy  . COPD (chronic obstructive pulmonary disease) ([place])   . Coronary artery disease   . Diverticulitis   . Diverticulosis   . GERD (gastroesophageal reflux disease)   . Glaucoma   . Heart failure, type unknown ([facility]) 03/11/2017  . History of radiation therapy 08/05/19- 09/03/19   Right Breast/ SCV 16 fractions of 2.66 Gy each for a total of 42.56 Gy. Right breast boost 4 fractions of 2 Gy each to total 8 Gy.   [facility] Hyperlipidemia 03/11/2017  . Hypertension   . Hypothyroidism   . Macular degeneration    wet and dry per pt's son  . Personal history of chemotherapy   . Personal history of radiation therapy   . Pneumonia X 1   "years and years ago" (03/26/2017)  . Shortness of breath 03/11/2017  . Status post dilation of esophageal narrowing     Family History  Problem Relation Age of Onset  . CAD Mother   . Cataracts Mother   . Coronary artery disease Father   . Leukemia Sister   . Amblyopia Neg Hx   . Blindness Neg Hx   . Diabetes Neg Hx   . Glaucoma Neg Hx   . Macular degeneration Neg Hx   . Retinal detachment Neg Hx   . Strabismus Neg Hx   . Retinitis pigmentosa Neg Hx   . Colon cancer Neg Hx   . Colon polyps Neg Hx   . Esophageal cancer Neg Hx   . Rectal cancer Neg Hx   . Stomach cancer Neg Hx     Past Surgical History:  Procedure Laterality Date  . BILATERAL OOPHORECTOMY Bilateral   . BREAST LUMPECTOMY Right 06/19/2019  . BREAST LUMPECTOMY WITH RADIOACTIVE SEED AND SENTINEL LYMPH NODE BIOPSY Right 06/19/2019    Procedure: RIGHT BREAST LUMPECTOMY WITH RADIOACTIVE SEED AND SENTINEL LYMPH NODE BIOPSY;  Surgeon: Jovita Kussmaul, MD;  Location: [facility];  Service: General;  Laterality: Right;  . CATARACT EXTRACTION    . CATARACT EXTRACTION W/ INTRAOCULAR LENS  IMPLANT, BILATERAL Bilateral   . COLOSTOMY     Greater than 10 yrs  . CORONARY ANGIOPLASTY WITH STENT PLACEMENT  03/26/2017  . LAPAROSCOPIC RIGHT  COLECTOMY Right 12/15/2019   Procedure: LAPAROSCOPIC ASSISTED RIGHT COLECTOMY;  Surgeon: Jovita Kussmaul, MD;  Location: [facility];  Service: General;  Laterality: Right;  . LEFT HEART CATH AND CORONARY ANGIOGRAPHY N/A 03/26/2017   Procedure: Left Heart Cath and Coronary Angiography;  Surgeon: Belva Crome, MD;  Location: [facility];  Service: Cardiovascular;  Laterality: N/A;  . NERVE SURGERY     "lump on my sciatic nerve was removed years ago"  . SHOULDER OPEN ROTATOR CUFF REPAIR Right   . THYROIDECTOMY, PARTIAL    . TONSILLECTOMY    . UPPER GASTROINTESTINAL ENDOSCOPY     > ten yrs   Social History   Occupational History  . Occupation: retired  Tobacco Use  . Smoking status: Former    Packs/day: 0.50    Years: 60.00    Total pack years: 30.00    Types: Cigarettes  . Smokeless tobacco: Never  Vaping Use  . Vaping Use: Never used  Substance and Sexual Activity  . Alcohol use: Yes    Comment: twice a month  . Drug use: No  . Sexual activity: Not Currently

## 2022-05-15 NOTE — Patient Instructions (Addendum)
Avoid bending, stooping and avoid lifting weights greater than 10 lbs. Avoid prolong standing and walking. Avoid frequent bending and stooping  No lifting greater than 10 lbs. May use ice or moist heat for pain. Weight loss is of benefit. Handicap license is approved. Cataflam 50 mg po daily for arthritis pain   Yoga or stretching exercises or pool exercise program.    See your primary care MD and be sure that they are aware of the use of the cataflam Daily. Using the least amount makes likelihood of side effect less.

## 2022-05-30 DIAGNOSIS — H04123 Dry eye syndrome of bilateral lacrimal glands: Secondary | ICD-10-CM | POA: Diagnosis not present

## 2022-05-30 DIAGNOSIS — H401331 Pigmentary glaucoma, bilateral, mild stage: Secondary | ICD-10-CM | POA: Diagnosis not present

## 2022-05-30 DIAGNOSIS — H353211 Exudative age-related macular degeneration, right eye, with active choroidal neovascularization: Secondary | ICD-10-CM | POA: Diagnosis not present

## 2022-05-30 DIAGNOSIS — H353122 Nonexudative age-related macular degeneration, left eye, intermediate dry stage: Secondary | ICD-10-CM | POA: Diagnosis not present

## 2022-06-01 ENCOUNTER — Inpatient Hospital Stay: Payer: Medicare Other

## 2022-06-01 ENCOUNTER — Inpatient Hospital Stay: Payer: Medicare Other | Admitting: Hematology

## 2022-06-12 ENCOUNTER — Telehealth: Payer: Self-pay | Admitting: Specialist

## 2022-06-12 ENCOUNTER — Encounter: Payer: Self-pay | Admitting: Radiology

## 2022-06-12 NOTE — Telephone Encounter (Signed)
Pt's son Josph Macho called requesting a call back from [facility]. He is asking for pt refills of diclofenac and gabapentin. Also Josph Macho states Dr Dema Severin office states they never received letter from Dr. Louanne Skye. Seen letter on pt chart. Josph Macho is asking for  letter to be emailed to him but did not provide email. He is asking for Alyse Low to call him and will provide email then. Fred phone number is [phone].

## 2022-06-20 ENCOUNTER — Encounter: Payer: Self-pay | Admitting: Specialist

## 2022-06-20 NOTE — Telephone Encounter (Signed)
Letter sent to her primary care requesting that she take over the prescribing of her meds diclofenac and gabapentin but also  Stating to have her call for an appointment if her condition worsens or the medications become ineffective.

## 2022-07-24 ENCOUNTER — Other Ambulatory Visit: Payer: Self-pay | Admitting: Hematology

## 2022-07-25 ENCOUNTER — Telehealth: Payer: Self-pay | Admitting: Nurse Practitioner

## 2022-07-25 DIAGNOSIS — M810 Age-related osteoporosis without current pathological fracture: Secondary | ICD-10-CM | POA: Diagnosis not present

## 2022-07-25 NOTE — Telephone Encounter (Signed)
Per 12/5 IB, son called and confirmed

## 2022-08-06 NOTE — Progress Notes (Deleted)
[place]   [phone] [phone]   Clinic Follow up Note   Patient Care Team: [name], MD as PCP - General (Family Medicine) Skeet Latch, MD as PCP - Cardiology (Cardiology) Rockwell Germany, RN as Oncology Nurse Navigator Mauro Kaufmann, RN as Oncology Nurse Navigator Jovita Kussmaul, MD as Consulting Physician (General Surgery) [name], MD as Consulting Physician (Hematology) Eppie Gibson, MD as Attending Physician (Radiation Oncology) 08/06/2022  CHIEF COMPLAINT: Follow up h/o colon and breast cancers  SUMMARY OF ONCOLOGIC HISTORY: Oncology History Overview Note  Cancer Staging Cancer of right colon [facility]) Staging form: Colon and Rectum, AJCC 8th Edition - Pathologic stage from 12/15/2019: Stage I (pT2, pN0, cM0) - Signed by [name], MD on 01/08/2020  Malignant neoplasm of upper-outer quadrant of right breast in female, estrogen receptor positive ([place]) Staging form: Breast, AJCC 8th Edition - Clinical: Stage IIA (cT1c, cN1, cM0, G3, ER+, PR+, HER2-) - Signed by [name], MD on 04/09/2019 - Pathologic stage from 06/19/2019: Stage IB (pT1c, pN2a, cM0, G2, ER+, PR+, HER2-) - Signed by [name], MD on 07/03/2019    Malignant neoplasm of upper-outer quadrant of right breast in female, estrogen receptor positive ([place])  03/19/2019 Mammogram   Diagnostic Mammogram 03/19/19  IMPRESSION The 2 cm distortion in the  right breast 9:30 position middle depth 3cm from the nipple is indeterminate. [place] recommended.  There is also a 1.2cmx3.2cm enlarged right axillary LN highly suggestive of malignancy.     03/31/2019 Initial Biopsy   Diagnosis 03/31/19  1. Breast, right, needle core biopsy, 9:30 o'clock, mid depth, 3cmfn - INVASIVE DUCTAL CARCINOMA. - LYMPHOVASCULAR INVASION IS IDENTIFIED. - SEE COMMENT. 2. Lymph node, needle/core biopsy, right axilla - INVASIVE DUCTAL CARCINOMA. - SEE COMMENT.   03/31/2019 Receptors her2   1.  PROGNOSTIC INDICATORS Results: IMMUNOHISTOCHEMICAL AND MORPHOMETRIC ANALYSIS PERFORMED MANUALLY The tumor cells are NEGATIVE for Her2 (0). Estrogen Receptor: 100%, POSITIVE, STRONG STAINING INTENSITY Progesterone Receptor: 10%, POSITIVE, STRONG STAINING INTENSITY Proliferation Marker Ki67: 10%  2. PROGNOSTIC INDICATORS Results: IMMUNOHISTOCHEMICAL AND MORPHOMETRIC ANALYSIS PERFORMED MANUALLY Estrogen Receptor: 100%, POSITIVE, STRONG STAINING INTENSITY Progesterone Receptor: 0%, NEGATIVE Proliferation Marker Ki67: 40%   04/03/2019 Initial Diagnosis   Malignant neoplasm of upper-outer quadrant of right breast in female, estrogen receptor positive ([place])   04/09/2019 Cancer Staging   Staging form: Breast, AJCC 8th Edition - Clinical: Stage IIA (cT1c, cN1, cM0, G3, ER+, PR+, HER2-) - Signed by [name], MD on 04/09/2019  Staging form: Breast, AJCC 8th Edition - Clinical: Stage IIA (cT1c, cN1, cM0, G3, ER+, PR+, HER2-) - Signed by [name], MD on 04/09/2019   03/2019 -  Anti-estrogen oral therapy   Tamoxifen 74m daily starting 03/2019. Held 05/30/19 -07/2019 for surgery.   06/19/2019 Surgery   RIGHT BREAST LUMPECTOMY WITH RADIOACTIVE SEED AND SENTINEL LYMPH NODE BIOPSY by Dr [name] 06/19/19    06/19/2019 Pathology Results   FINAL MICROSCOPIC DIAGNOSIS: 06/19/19  A. BREAST, RIGHT, LUMPECTOMY:  -  Mucinous carcinoma, Nottingham grade 2  -  Margins uninvolved by carcinoma (0.2 cm; medial margin)  -  Lymphovascular space invasion present  -  Previous biopsy site changes present  -  See oncology table and comment below   B. LYMPH NODE, RIGHT AXILLARY #1, SENTINEL, BIOPSY:  -  Metastatic carcinoma involving two lymph nodes (2/2)  -  Extracapsular extension present  -  See comment   C. BREAST, RIGHT MEDIAL MARGIN, EXCISION:  -  Residual invasive  carcinoma  -  Usual ductal hyperplasia with calcifications  -  See comment   D. LYMPH NODE, RIGHT AXILLARY #2, SENTINEL, BIOPSY:  -   Metastatic carcinoma involving one lymph node (1/1)   E. LYMPH NODE, RIGHT AXILLARY #1 , BIOPSY:  -  Metastatic carcinoma involving one lymph node (1/1)   F. LYMPH NODE, RIGHT AXILLARY #2, BIOPSY:  -  No carcinoma identified in one lymph node (0/1)    06/19/2019 Cancer Staging   Staging form: Breast, AJCC 8th Edition - Pathologic stage from 06/19/2019: Stage IB (pT1c, pN2a, cM0, G2, ER+, PR+, HER2-) - Signed by [name], MD on 07/03/2019   07/22/2019 PET scan   IMPRESSION: 1. Low level hypermetabolism identified in the surgical beds of the right breast and right axilla. This is presumably related to the surgery. 2. Tiny right subpectoral lymph nodes are asymmetric in concerning for metastatic disease on CT imaging. No hypermetabolism on the PET study although size of these lymph nodes is below reliable resolution on PET imaging. 3. 10 mm short axis precarinal lymph node shows low level hypermetabolism. Metastatic involvement not excluded. 4. 11 mm ground-glass nodule in right upper lobe shows low level FDG accumulation. Well differentiated or low-grade neoplasm is a distinct concern. Close follow-up recommended. 5. Focal hypermetabolism in the right colon, potentially related to the ileocecal valve but incompletely characterized. Adenoma or carcinoma could have this appearance. Correlation with colorectal cancer screening history recommended. 6. Focal hypermetabolism identified in the region of the left pubic bone and adjacent sigmoid colon. No underlying bony or colonic lesion evident. Metastatic involvement of the left pubic bone not excluded. 7.  Aortic Atherosclerois (ICD10-170.0)     08/05/2019 - 09/03/2019 Radiation Therapy   Adjuvant RT with Dr Isidore Moos 08/05/19-09/03/19.    06/13/2021 PET scan   IMPRESSION: 1. Sub solid right upper lobe pulmonary nodule shows low level hypermetabolism. Neoplasm not excluded. 2. 11 mm ground-glass nodule superior segment left lower lobe  shows very low level FDG accumulation. Findings may be infectious/inflammatory. As well differentiated in low-grade neoplasm can be poorly FDG avid, continued close attention recommended. 3. No evidence for hypermetabolic metastatic disease in the abdomen or pelvis. 4.  Aortic Atherosclerois (ICD10-170.0) 5.  Emphysema. (ION62-X52.9)   Cancer of right colon ([place])  10/15/2019 Procedure   Colonoscopy by Dr Silverio Decamp  IMPRESSION - Likely malignant tumor in the proximal ascending colon. Biopsied. Tattooed. - Severe diverticulosis in the sigmoid colon, in the descending colon, in the transverse colon and in the ascending colon. - Non-bleeding internal hemorrhoids.   10/15/2019 Initial Biopsy   Diagnosis Ascending Colon Polyp, mass - ADENOCARCINOMA. Microscopic Comment IHC for MMR will be reported separately. Results reported to Dr. Harl Bowie on 10/16/2019. Intradepartmental consultation (Dr. Vic Ripper).   12/15/2019 Initial Diagnosis   Cancer of right colon ([place])   12/15/2019 Cancer Staging   Staging form: Colon and Rectum, AJCC 8th Edition - Pathologic stage from 12/15/2019: Stage I (pT2, pN0, cM0) - Signed by [name], MD on 01/08/2020   12/15/2019 Surgery   LAPAROSCOPIC ASSISTED RIGHT COLECTOMY by Dr Marlou Starks and Dr Marcello Moores   12/15/2019 Pathology Results   FINAL MICROSCOPIC DIAGNOSIS:   A. COLON, TERMINAL ILEUM AND RIGHT, COLECTOMY:  - Invasive moderately differentiated adenocarcinoma, 4.6 cm, involving  ascending colon  - Carcinoma invades into deeper muscularis propria but not beyond  - Resection margins are negative for carcinoma  - Negative for lymphovascular or perineural invasion  - Unremarkable appendix  - Nineteen lymph nodes,  negative for carcinoma (0/19)  - See oncology table    12/15/2019 Genetic Testing   ADDENDUM:   Mismatch Repair Protein (IHC)  SUMMARY INTERPRETATION: ABNORMAL  There is loss of the major and minor MMR proteins MLH1 and PMS2. The  loss of  expression may be secondary to promoter hyper-methylation, gene  mutation or other genetic event. BRAF mutation testing and/or MLH1  methylation testing is indicated. The presence of a BRAF mutation and/or  MLH1 hypermethylation is indicative of a sporadic-type tumor. The  absence of either BRAF mutation and/or presence of normal methylation  indicate the possible presence of a hereditary germline mutation (e.g.  Lynch syndrome) and referral to genetic counseling is warranted. It is  recommended that the loss of protein expression be correlated with  molecular based MSI testing.   IHC EXPRESSION RESULTS  TEST           RESULT  MLH1:          LOSS OF NUCLEAR EXPRESSION  MSH2:          Preserved nuclear expression  MSH6:          Preserved nuclear expression  PMS2:          LOSS OF NUCLEAR EXPRESSION    12/29/2019 Imaging   CT AP W contrast  IMPRESSION: 1. Sigmoid diverticulosis without inflammation. Status post right colectomy. 2. Multiple small calcified uterine fibroids. 3. No acute abnormality seen in the abdomen or pelvis.   Aortic Atherosclerosis (ICD10-I70.0).     01/28/2020 Imaging   CT AP w contrast  IMPRESSION: 1. Moderate irregular wall thickening of the right colon with some areas of low attenuation. Findings could be due to cecal diverticulitis or other inflammatory or infectious colitis. I doubt that this represents recurrent colon cancer. 2. Status post partial right colectomy. No findings for locoregional adenopathy or metastatic disease. 3. Stable advanced atherosclerotic calcifications involving the aorta and branch vessels. 4. Stable benign-appearing central hepatic cysts. 5. Stable calcified uterine fibroids. 6. Aortic atherosclerosis.   Aortic Atherosclerosis (ICD10-I70.0).   04/30/2020 Imaging   CT Chest  IMPRESSION: 1. Ground-glass nodule in the RIGHT upper lobe measuring 1.4 x 0.9 cm previously 1.4 x 0.9 cm. 2. Ground-glass nodule in the LEFT upper  lobe measuring 11 x 12 mm, spiculated margins and mild fissural distortion, accompanying features of this nodule. Findings are concerning for for multifocal bronchogenic neoplasm given persistence. 3. Stable size of small nodes along the RIGHT paratracheal chain largest approximately 9 mm. 4. Stable hepatic hemangioma. 5. Dilation of the ascending aorta to 3.7 cm, similar to previous exams. 6. Three-vessel coronary artery disease. 7. Aortic atherosclerosis.   Aortic Atherosclerosis (ICD10-I70.0).     02/01/2021 Imaging   CT CAP  IMPRESSION: No evidence of recurrent or metastatic carcinoma within the abdomen or pelvis. No evidence of thoracic metastatic disease.   Stable hepatic hemangioma.   Stable small calcified uterine fibroids.   Colonic diverticulosis. No radiographic evidence of diverticulitis.   Sub-solid pulmonary nodule in anterior right upper lobe shows increased solid component, suspicious for primary lung adenocarcinoma. PET-CT is recommended for further evaluation.   Stable 11 mm ground-glass nodule in superior segment of left lower lobe. Recommend continued attention on follow-up imaging, and this could also be assessed by PET-CT.   Aortic Atherosclerosis (ICD10-I70.0) and Emphysema (ICD10-J43.9).     06/13/2021 PET scan   IMPRESSION: 1. Sub solid right upper lobe pulmonary nodule shows low level hypermetabolism. Neoplasm not excluded. 2. 11 mm ground-glass  nodule superior segment left lower lobe shows very low level FDG accumulation. Findings may be infectious/inflammatory. As well differentiated in low-grade neoplasm can be poorly FDG avid, continued close attention recommended. 3. No evidence for hypermetabolic metastatic disease in the abdomen or pelvis. 4.  Aortic Atherosclerois (ICD10-170.0) 5.  Emphysema. (ZOX09-U04.9)     CURRENT THERAPY: Tamoxifen, starting 03/2019, held 10-07/2019 for surgery; and colon cancer surveillance   INTERVAL  HISTORY: Ms. Odle returns for follow up as scheduled. Last seen by Dr. Burr Medico 11/18/21. Mammogram 04/13/22 was negative.    REVIEW OF SYSTEMS:   Constitutional: Denies fevers, chills or abnormal weight loss Eyes: Denies blurriness of vision Ears, nose, mouth, throat, and face: Denies mucositis or sore throat Respiratory: Denies cough, dyspnea or wheezes Cardiovascular: Denies palpitation, chest discomfort or lower extremity swelling Gastrointestinal:  Denies nausea, heartburn or change in bowel habits Skin: Denies abnormal skin rashes Lymphatics: Denies new lymphadenopathy or easy bruising Neurological:Denies numbness, tingling or new weaknesses Behavioral/Psych: Mood is stable, no new changes  All other systems were reviewed with the patient and are negative.  MEDICAL HISTORY:  Past Medical History:  Diagnosis Date   Anxiety    Arthritis    Breast cancer ([place])    CAD (coronary artery disease)    a. 03/2017: 95% LCx stenosis --> PCI/DES placed   Cataract    Chest pain 03/27/2017   Chronic diastolic heart failure ([place]) 06/25/2017   Chronic lower back pain    Colon cancer ([place]) 11/2019   s/p colectomy   COPD (chronic obstructive pulmonary disease) ([place])    Coronary artery disease    Diverticulitis    Diverticulosis    GERD (gastroesophageal reflux disease)    Glaucoma    Heart failure, type unknown ([place]) 03/11/2017   History of radiation therapy 08/05/19- 09/03/19   Right Breast/ SCV 16 fractions of 2.66 Gy each for a total of 42.56 Gy. Right breast boost 4 fractions of 2 Gy each to total 8 Gy.    Hyperlipidemia 03/11/2017   Hypertension    Hypothyroidism    Macular degeneration    wet and dry per pt's son   Personal history of chemotherapy    Personal history of radiation therapy    Pneumonia X 1   "years and years ago" (03/26/2017)   Shortness of breath 03/11/2017   Status post dilation of esophageal narrowing     SURGICAL HISTORY: Past Surgical History:  Procedure  Laterality Date   BILATERAL OOPHORECTOMY Bilateral    BREAST LUMPECTOMY Right 06/19/2019   BREAST LUMPECTOMY WITH RADIOACTIVE SEED AND SENTINEL LYMPH NODE BIOPSY Right 06/19/2019   Procedure: RIGHT BREAST LUMPECTOMY WITH RADIOACTIVE SEED AND SENTINEL LYMPH NODE BIOPSY;  Surgeon: Jovita Kussmaul, MD;  Location: [facility];  Service: General;  Laterality: Right;   CATARACT EXTRACTION     CATARACT EXTRACTION W/ INTRAOCULAR LENS  IMPLANT, BILATERAL Bilateral    COLOSTOMY     Greater than 10 yrs   CORONARY ANGIOPLASTY WITH STENT PLACEMENT  03/26/2017   LAPAROSCOPIC RIGHT COLECTOMY Right 12/15/2019   Procedure: LAPAROSCOPIC ASSISTED RIGHT COLECTOMY;  Surgeon: Jovita Kussmaul, MD;  Location: [facility];  Service: General;  Laterality: Right;   LEFT HEART CATH AND CORONARY ANGIOGRAPHY N/A 03/26/2017   Procedure: Left Heart Cath and Coronary Angiography;  Surgeon: Belva Crome, MD;  Location: [facility];  Service: Cardiovascular;  Laterality: N/A;   NERVE SURGERY     "lump on my sciatic nerve was removed years ago"  SHOULDER OPEN ROTATOR CUFF REPAIR Right    THYROIDECTOMY, PARTIAL     TONSILLECTOMY     UPPER GASTROINTESTINAL ENDOSCOPY     > ten yrs    I have reviewed the social history and family history with the patient and they are unchanged from previous note.  ALLERGIES:  is allergic to lipitor [atorvastatin].  MEDICATIONS:  Current Outpatient Medications  Medication Sig Dispense Refill   albuterol (PROVENTIL HFA;VENTOLIN HFA) 108 (90 Base) MCG/ACT inhaler Inhale 2 puffs into the lungs every 6 (six) hours as needed (for shortness of breath/wheezing.).      aspirin EC 81 MG tablet Take 81 mg by mouth daily.     brimonidine (ALPHAGAN) 0.2 % ophthalmic solution Place 1 drop into the left eye 2 (two) times daily.      budesonide-formoterol (SYMBICORT) 160-4.5 MCG/ACT inhaler Inhale 2 puffs into the lungs 2 (two) times daily.     Calcium Carb-Cholecalciferol (CALCIUM + D3) 600-200 MG-UNIT TABS  Take 1 tablet by mouth daily.     Cholecalciferol (VITAMIN D3) 2000 units capsule Take 4,000 Units by mouth daily.     Coenzyme Q10 (COQ10) 100 MG CAPS Take 100 mg by mouth daily.     diclofenac (CATAFLAM) 50 MG tablet Take 1 tablet (50 mg total) by mouth daily after breakfast. 30 tablet 3   gabapentin (NEURONTIN) 100 MG capsule Take 1 capsule (100 mg total) by mouth at bedtime. 30 capsule 3   latanoprost (XALATAN) 0.005 % ophthalmic solution Place 1 drop into both eyes at bedtime.     levothyroxine (SYNTHROID, LEVOTHROID) 100 MCG tablet Take 100 mcg by mouth daily before breakfast.   0   LUTEIN PO Take 1 tablet by mouth daily.     mirtazapine (REMERON) 15 MG tablet Take 15 mg by mouth at bedtime.     Multiple Vitamin (MULTIVITAMIN) tablet Take 1 tablet by mouth daily.     nitroGLYCERIN (NITROSTAT) 0.4 MG SL tablet Place 1 tablet (0.4 mg total) under the tongue every 5 (five) minutes as needed for chest pain. 25 tablet 3   Omega-3 Fatty Acids (FISH OIL PO) Take 1 capsule by mouth daily.     pravastatin (PRAVACHOL) 40 MG tablet TAKE 1 TABLET BY MOUTH DAILY GENERIC EQUIVALENT FOR PRAVACHOL 90 tablet 0   tamoxifen (NOLVADEX) 20 MG tablet Take 1 tablet by mouth once daily 30 tablet 0   TOVIAZ 4 MG TB24 tablet Take 4 mg by mouth daily.   1   traMADol (ULTRAM) 50 MG tablet Take 0.5-1 tablets (25-50 mg total) by mouth every 8 (eight) hours as needed. 30 tablet 0   Current Facility-Administered Medications  Medication Dose Route Frequency Provider Last Rate Last Admin   aflibercept (EYLEA) SOLN 2 mg  2 mg Intravitreal  Bernarda Caffey, MD   2 mg at 04/16/18 1424   aflibercept (EYLEA) SOLN 2 mg  2 mg Intravitreal  Bernarda Caffey, MD   2 mg at 05/14/18 1358   aflibercept (EYLEA) SOLN 2 mg  2 mg Intravitreal  Bernarda Caffey, MD   2 mg at 06/25/18 1433   aflibercept (EYLEA) SOLN 2 mg  2 mg Intravitreal  Bernarda Caffey, MD   2 mg at 08/07/18 1557   aflibercept (EYLEA) SOLN 2 mg  2 mg Intravitreal  Bernarda Caffey, MD   2 mg at 10/11/18 1522   Bevacizumab (AVASTIN) SOLN 1.25 mg  1.25 mg Intravitreal  Bernarda Caffey, MD   1.25 mg at 01/07/18 1429  Bevacizumab (AVASTIN) SOLN 1.25 mg  1.25 mg Intravitreal  Bernarda Caffey, MD   1.25 mg at 02/19/18 1356   Bevacizumab (AVASTIN) SOLN 1.25 mg  1.25 mg Intravitreal  Bernarda Caffey, MD   1.25 mg at 03/19/18 1347    PHYSICAL EXAMINATION: ECOG PERFORMANCE STATUS: {CHL ONC ECOG PS:[id number]}  There were no vitals filed for this visit. There were no vitals filed for this visit.  GENERAL:alert, no distress and comfortable SKIN: skin color, texture, turgor are normal, no rashes or significant lesions EYES: normal, Conjunctiva are pink and non-injected, sclera clear OROPHARYNX:no exudate, no erythema and lips, buccal mucosa, and tongue normal  NECK: supple, thyroid normal size, non-tender, without nodularity LYMPH:  no palpable lymphadenopathy in the cervical, axillary or inguinal LUNGS: clear to auscultation and percussion with normal breathing effort HEART: regular rate & rhythm and no murmurs and no lower extremity edema ABDOMEN:abdomen soft, non-tender and normal bowel sounds Musculoskeletal:no cyanosis of digits and no clubbing  NEURO: alert & oriented x 3 with fluent speech, no focal motor/sensory deficits  LABORATORY DATA:  I have reviewed the data as listed    Latest Ref Rng & Units 01/09/2022   10:02 AM 10/20/2021    9:54 AM 06/13/2021    2:11 PM  CBC  WBC 4.0 - 10.5 K/uL 5.9  7.0  5.0   Hemoglobin 12.0 - 15.0 g/dL 13.4  13.1  12.8   Hematocrit 36.0 - 46.0 % 39.8  38.3  37.0   Platelets 150 - 400 K/uL 229  179  202         Latest Ref Rng & Units 01/09/2022   10:02 AM 10/20/2021    9:54 AM 06/13/2021    2:11 PM  CMP  Glucose 70 - 99 mg/dL 111  87  93   BUN 8 - 23 mg/dL _0 Creatinine 0.44 - 1.00 mg/dL 0.70  0.85  0.65   Sodium 135 - 145 mmol/L 138  140  135   Potassium 3.5 - 5.1 mmol/L 3.8  4.5  3.8   Chloride 98 - 111 mmol/L  102  105  103   CO2 22 - 32 mmol/L _1 Calcium 8.9 - 10.3 mg/dL 9.4  9.7  9.1   Total Protein 6.5 - 8.1 g/dL 7.3  7.2  6.9   Total Bilirubin 0.3 - 1.2 mg/dL 0.5  0.4  0.7   Alkaline Phos 38 - 126 U/L 44  47  41   AST 15 - 41 U/L _2 ALT 0 - 44 U/L _3 RADIOGRAPHIC STUDIES: I have personally reviewed the radiological images as listed and agreed with the findings in the report. No results found.   ASSESSMENT & PLAN:  No problem-specific Assessment & Plan notes found for this encounter.   No orders of the defined types were placed in this encounter.  All questions were answered. The patient knows to call the clinic with any problems, questions or concerns. No barriers to learning was detected. I spent {CHL ONC TIME VISIT - ZOXWR:[id number]} counseling the patient face to face. The total time spent in the appointment was {CHL ONC TIME VISIT - BJYNW:[id number]} and more than 50% was on counseling and review of test results     Alla Feeling, NP 08/06/22

## 2022-08-08 ENCOUNTER — Inpatient Hospital Stay: Payer: Medicare Other | Attending: Family Medicine

## 2022-08-08 ENCOUNTER — Inpatient Hospital Stay: Payer: Medicare Other | Admitting: Nurse Practitioner

## 2022-08-22 ENCOUNTER — Telehealth: Payer: Self-pay | Admitting: Hematology

## 2022-08-22 NOTE — Telephone Encounter (Signed)
Patient aware of appointment changes  

## 2022-08-28 ENCOUNTER — Other Ambulatory Visit: Payer: Self-pay | Admitting: Hematology

## 2022-08-30 DIAGNOSIS — R3915 Urgency of urination: Secondary | ICD-10-CM | POA: Diagnosis not present

## 2022-09-05 NOTE — Progress Notes (Shared)
[facility] Note  09/11/2022     CHIEF COMPLAINT Patient presents for No chief complaint on file.    HISTORY OF PRESENT ILLNESS: Marissa Caldwell is a 89 y.o. female who presents to the clinic today for:      Referring physician:  [name], MD [address] [place],  [place] [address]  HISTORICAL INFORMATION:   Selected notes from the MEDICAL RECORD NUMBER Referred by Dr. Quentin Ore for concern of exudative ARMD OD   CURRENT MEDICATIONS: Current Outpatient Medications (Ophthalmic Drugs)  Medication Sig   brimonidine (ALPHAGAN) 0.2 % ophthalmic solution Place 1 drop into the left eye 2 (two) times daily.    latanoprost (XALATAN) 0.005 % ophthalmic solution Place 1 drop into both eyes at bedtime.   Current Facility-Administered Medications (Ophthalmic Drugs)  Medication Route   aflibercept (EYLEA) SOLN 2 mg Intravitreal   aflibercept (EYLEA) SOLN 2 mg Intravitreal   aflibercept (EYLEA) SOLN 2 mg Intravitreal   aflibercept (EYLEA) SOLN 2 mg Intravitreal   aflibercept (EYLEA) SOLN 2 mg Intravitreal   Current Outpatient Medications (Other)  Medication Sig   albuterol (PROVENTIL HFA;VENTOLIN HFA) 108 (90 Base) MCG/ACT inhaler Inhale 2 puffs into the lungs every 6 (six) hours as needed (for shortness of breath/wheezing.).    aspirin EC 81 MG tablet Take 81 mg by mouth daily.   budesonide-formoterol (SYMBICORT) 160-4.5 MCG/ACT inhaler Inhale 2 puffs into the lungs 2 (two) times daily.   Calcium Carb-Cholecalciferol (CALCIUM + D3) 600-200 MG-UNIT TABS Take 1 tablet by mouth daily.   Cholecalciferol (VITAMIN D3) 2000 units capsule Take 4,000 Units by mouth daily.   Coenzyme Q10 (COQ10) 100 MG CAPS Take 100 mg by mouth daily.   diclofenac (CATAFLAM) 50 MG tablet Take 1 tablet (50 mg total) by mouth daily after breakfast.   gabapentin (NEURONTIN) 100 MG capsule Take 1 capsule (100 mg total) by mouth at bedtime.   levothyroxine  (SYNTHROID, LEVOTHROID) 100 MCG tablet Take 100 mcg by mouth daily before breakfast.    LUTEIN PO Take 1 tablet by mouth daily.   mirtazapine (REMERON) 15 MG tablet Take 15 mg by mouth at bedtime.   Multiple Vitamin (MULTIVITAMIN) tablet Take 1 tablet by mouth daily.   nitroGLYCERIN (NITROSTAT) 0.4 MG SL tablet Place 1 tablet (0.4 mg total) under the tongue every 5 (five) minutes as needed for chest pain.   Omega-3 Fatty Acids (FISH OIL PO) Take 1 capsule by mouth daily.   pravastatin (PRAVACHOL) 40 MG tablet TAKE 1 TABLET BY MOUTH DAILY GENERIC EQUIVALENT FOR PRAVACHOL   tamoxifen (NOLVADEX) 20 MG tablet TAKE 1 TABLET BY MOUTH ONCE DAILY . APPOINTMENT REQUIRED FOR FUTURE REFILLS   TOVIAZ 4 MG TB24 tablet Take 4 mg by mouth daily.    traMADol (ULTRAM) 50 MG tablet Take 0.5-1 tablets (25-50 mg total) by mouth every 8 (eight) hours as needed.   Current Facility-Administered Medications (Other)  Medication Route   Bevacizumab (AVASTIN) SOLN 1.25 mg Intravitreal   Bevacizumab (AVASTIN) SOLN 1.25 mg Intravitreal   Bevacizumab (AVASTIN) SOLN 1.25 mg Intravitreal   REVIEW OF SYSTEMS:   ALLERGIES Allergies  Allergen Reactions   Lipitor [Atorvastatin]     Elevated LFT's   PAST MEDICAL HISTORY Past Medical History:  Diagnosis Date   Anxiety    Arthritis    Breast cancer ([place])    CAD (coronary artery disease)    a. 03/2017: 95% LCx stenosis --> PCI/DES placed  Cataract    Chest pain 03/27/2017   Chronic diastolic heart failure ([place]) 06/25/2017   Chronic lower back pain    Colon cancer ([place]) 11/2019   s/p colectomy   COPD (chronic obstructive pulmonary disease) ([place])    Coronary artery disease    Diverticulitis    Diverticulosis    GERD (gastroesophageal reflux disease)    Glaucoma    Heart failure, type unknown ([place]) 03/11/2017   History of radiation therapy 08/05/19- 09/03/19   Right Breast/ SCV 16 fractions of 2.66 Gy each for a total of 42.56 Gy. Right breast boost 4 fractions of  2 Gy each to total 8 Gy.    Hyperlipidemia 03/11/2017   Hypertension    Hypothyroidism    Macular degeneration    wet and dry per pt's son   Personal history of chemotherapy    Personal history of radiation therapy    Pneumonia X 1   "years and years ago" (03/26/2017)   Shortness of breath 03/11/2017   Status post dilation of esophageal narrowing    Past Surgical History:  Procedure Laterality Date   BILATERAL OOPHORECTOMY Bilateral    BREAST LUMPECTOMY Right 06/19/2019   BREAST LUMPECTOMY WITH RADIOACTIVE SEED AND SENTINEL LYMPH NODE BIOPSY Right 06/19/2019   Procedure: RIGHT BREAST LUMPECTOMY WITH RADIOACTIVE SEED AND SENTINEL LYMPH NODE BIOPSY;  Surgeon: Jovita Kussmaul, MD;  Location: [facility];  Service: General;  Laterality: Right;   CATARACT EXTRACTION     CATARACT EXTRACTION W/ INTRAOCULAR LENS  IMPLANT, BILATERAL Bilateral    COLOSTOMY     Greater than 10 yrs   CORONARY ANGIOPLASTY WITH STENT PLACEMENT  03/26/2017   LAPAROSCOPIC RIGHT COLECTOMY Right 12/15/2019   Procedure: LAPAROSCOPIC ASSISTED RIGHT COLECTOMY;  Surgeon: Jovita Kussmaul, MD;  Location: [facility];  Service: General;  Laterality: Right;   LEFT HEART CATH AND CORONARY ANGIOGRAPHY N/A 03/26/2017   Procedure: Left Heart Cath and Coronary Angiography;  Surgeon: Belva Crome, MD;  Location: [facility];  Service: Cardiovascular;  Laterality: N/A;   NERVE SURGERY     "lump on my sciatic nerve was removed years ago"   SHOULDER OPEN ROTATOR CUFF REPAIR Right    THYROIDECTOMY, PARTIAL     TONSILLECTOMY     UPPER GASTROINTESTINAL ENDOSCOPY     > ten yrs    FAMILY HISTORY Family History  Problem Relation Age of Onset   CAD Mother    Cataracts Mother    Coronary artery disease Father    Leukemia Sister    Amblyopia Neg Hx    Blindness Neg Hx    Diabetes Neg Hx    Glaucoma Neg Hx    Macular degeneration Neg Hx    Retinal detachment Neg Hx    Strabismus Neg Hx    Retinitis pigmentosa Neg Hx    Colon cancer Neg  Hx    Colon polyps Neg Hx    Esophageal cancer Neg Hx    Rectal cancer Neg Hx    Stomach cancer Neg Hx    SOCIAL HISTORY Social History   Tobacco Use   Smoking status: Former    Packs/day: 0.50    Years: 60.00    Total pack years: 30.00    Types: Cigarettes   Smokeless tobacco: Never  Vaping Use   Vaping Use: Never used  Substance Use Topics   Alcohol use: Yes    Comment: twice a month   Drug use: No  OPHTHALMIC EXAM:  Not recorded    IMAGING AND PROCEDURES  Imaging and Procedures for '@TODAY'$ @          ASSESSMENT/PLAN:  No diagnosis found.  1. 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 Kathlen Mody  - S/P IVA OD #1 (05.20.19), #2 (07.02.19), #3 (07.30.19)  - S/P IVE OD #1 (08.27.19), #2 (09.24.19), #3 (11.05.19), #4 (12.17.19), #5 (02.21.20), #6 (05.05.20), # 7 (08.05.20), #8 (9.2.20), #9 (11.10.20), #10 (02.12.21), #11 (05.12.21), #12 (08.13.21)  - VA remains CF  - OCT with stable improvment in peripapillary / nasal macula IRF and SRF; persistent thick PED/SRHM/disciform scar centrally  - discussed findings and guarded prognosis and treatment options  - due to stable, inactive submacular scar and VA CF -- recommend holding off on injection today  - pt in agreement  - Eylea informed consent form signed and scanned on 11.10.2020  - Eylea paperwork and benefits investigation started on 07.30.19  - f/u in 6 months -- DFE/OCT  2. Age related macular degeneration, non-exudative, left eye  - mild progression of geographic atrophy on exam and OCT today  - BCVA 20/100 from 20/80  - The incidence, anatomy, and pathology of dry AMD, risk of progression, and the AREDS and AREDS 2 study including smoking risks discussed with patient.  - continue [place] grid monitoring  3. PVD / vitreous syneresis OU  Discussed findings and prognosis  No RT or RD on 360 peripheral exam  Reviewed s/s of  RT/RD  Strict return precautions for any such RT/RD symptoms  4. Pigmentary glaucoma OU-   - using latanoprost OU qhs / brimonidine BID OU  - IOP good today -- 10,9  - monitor  5. Pseudophakia OU  - s/p CE/IOL OU  - beautiful surgery, doing well  - monitor  6. Dry eyes OU  - recommend artificial tears and lubricating ointment as needed  Ophthalmic Meds Ordered this visit:  No orders of the defined types were placed in this encounter.    No follow-ups on file.  There are no Patient Instructions on file for this visit.  This document serves as a record of services personally performed by Gardiner Sleeper, MD, PhD. It was created on their behalf by Orvan Falconer, an ophthalmic technician. The creation of this record is the provider's dictation and/or activities during the visit.    Electronically signed by: Orvan Falconer, OA, 09/05/22  10:46 AM    Gardiner Sleeper, M.D., Ph.D. Diseases & Surgery of the Retina and Vitreous [facility]: M myopia (nearsighted); A astigmatism; H hyperopia (farsighted); P presbyopia; Mrx spectacle prescription;  CTL contact lenses; OD right eye; OS left eye; OU both eyes  XT exotropia; ET esotropia; PEK punctate epithelial keratitis; PEE punctate epithelial erosions; DES dry eye syndrome; MGD meibomian gland dysfunction; ATs artificial tears; PFAT's preservative free artificial tears; [place] nuclear sclerotic cataract; PSC posterior subcapsular cataract; ERM epi-retinal membrane; PVD posterior vitreous detachment; RD retinal detachment; DM diabetes mellitus; DR diabetic retinopathy; NPDR non-proliferative diabetic retinopathy; PDR proliferative diabetic retinopathy; CSME clinically significant macular edema; DME diabetic macular edema; dbh dot blot hemorrhages; CWS cotton wool spot; POAG primary open angle glaucoma; C/D cup-to-disc ratio; HVF humphrey visual field; GVF goldmann visual field; OCT optical coherence  tomography; IOP intraocular pressure; BRVO Branch retinal vein occlusion; CRVO central retinal vein occlusion; CRAO central retinal artery occlusion; BRAO branch retinal artery  occlusion; RT retinal tear; SB scleral buckle; PPV pars plana vitrectomy; VH Vitreous hemorrhage; PRP panretinal laser photocoagulation; IVK intravitreal kenalog; VMT vitreomacular traction; MH Macular hole;  NVD neovascularization of the disc; NVE neovascularization elsewhere; AREDS age related eye disease study; ARMD age related macular degeneration; POAG primary open angle glaucoma; EBMD epithelial/anterior basement membrane dystrophy; ACIOL anterior chamber intraocular lens; IOL intraocular lens; PCIOL posterior chamber intraocular lens; Phaco/IOL phacoemulsification with intraocular lens placement; [place] photorefractive keratectomy; LASIK laser assisted in situ keratomileusis; HTN hypertension; DM diabetes mellitus; COPD chronic obstructive pulmonary disease

## 2022-09-11 ENCOUNTER — Encounter (INDEPENDENT_AMBULATORY_CARE_PROVIDER_SITE_OTHER): Payer: Medicare Other | Admitting: Ophthalmology

## 2022-09-11 ENCOUNTER — Encounter (INDEPENDENT_AMBULATORY_CARE_PROVIDER_SITE_OTHER): Payer: Self-pay

## 2022-09-11 DIAGNOSIS — H401331 Pigmentary glaucoma, bilateral, mild stage: Secondary | ICD-10-CM

## 2022-09-11 DIAGNOSIS — Z961 Presence of intraocular lens: Secondary | ICD-10-CM

## 2022-09-11 DIAGNOSIS — H04123 Dry eye syndrome of bilateral lacrimal glands: Secondary | ICD-10-CM

## 2022-09-11 DIAGNOSIS — H43813 Vitreous degeneration, bilateral: Secondary | ICD-10-CM

## 2022-09-11 DIAGNOSIS — H353122 Nonexudative age-related macular degeneration, left eye, intermediate dry stage: Secondary | ICD-10-CM

## 2022-09-11 DIAGNOSIS — H353213 Exudative age-related macular degeneration, right eye, with inactive scar: Secondary | ICD-10-CM

## 2022-09-12 ENCOUNTER — Ambulatory Visit: Payer: Medicare Other | Admitting: Nurse Practitioner

## 2022-09-12 ENCOUNTER — Other Ambulatory Visit: Payer: Medicare Other

## 2022-09-12 NOTE — Progress Notes (Signed)
[facility] Note  09/18/2022     CHIEF COMPLAINT Patient presents for Retina Follow Up    HISTORY OF PRESENT ILLNESS: Marissa Caldwell is a 89 y.o. female who presents to the clinic today for:   HPI     Retina Follow Up   Patient presents with  Wet AMD.  In right eye.  This started 6 months ago.  Duration of 6 months.  Since onset it is stable.  I, the attending physician,  performed the HPI with the patient and updated documentation appropriately.        Comments   6 month retina follow up ARMD OD pt states for the most part vision has been stable she is having some watering at times and is using AT's as needed she denies flashes or floaters       Last edited by Bernarda Caffey, MD on 09/19/2022 12:48 AM.    Pt states [facility] has decreased for reading.   Referring physician:  Lisabeth Pick, MD [address] [place],  [place] [address]  HISTORICAL INFORMATION:   Selected notes from the MEDICAL RECORD NUMBER Referred by Dr. Quentin Ore for concern of exudative ARMD OD   CURRENT MEDICATIONS: Current Outpatient Medications (Ophthalmic Drugs)  Medication Sig   brimonidine (ALPHAGAN) 0.2 % ophthalmic solution Place 1 drop into the left eye 2 (two) times daily.    latanoprost (XALATAN) 0.005 % ophthalmic solution Place 1 drop into both eyes at bedtime.   Current Facility-Administered Medications (Ophthalmic Drugs)  Medication Route   aflibercept (EYLEA) SOLN 2 mg Intravitreal   aflibercept (EYLEA) SOLN 2 mg Intravitreal   aflibercept (EYLEA) SOLN 2 mg Intravitreal   aflibercept (EYLEA) SOLN 2 mg Intravitreal   aflibercept (EYLEA) SOLN 2 mg Intravitreal   Current Outpatient Medications (Other)  Medication Sig   albuterol (PROVENTIL HFA;VENTOLIN HFA) 108 (90 Base) MCG/ACT inhaler Inhale 2 puffs into the lungs every 6 (six) hours as needed (for shortness of breath/wheezing.).    aspirin EC 81 MG tablet Take 81 mg by mouth daily.    budesonide-formoterol (SYMBICORT) 160-4.5 MCG/ACT inhaler Inhale 2 puffs into the lungs 2 (two) times daily.   Calcium Carb-Cholecalciferol (CALCIUM + D3) 600-200 MG-UNIT TABS Take 1 tablet by mouth daily.   Cholecalciferol (VITAMIN D3) 2000 units capsule Take 4,000 Units by mouth daily.   Coenzyme Q10 (COQ10) 100 MG CAPS Take 100 mg by mouth daily.   diclofenac (CATAFLAM) 50 MG tablet Take 1 tablet (50 mg total) by mouth daily after breakfast.   gabapentin (NEURONTIN) 100 MG capsule Take 1 capsule (100 mg total) by mouth at bedtime.   levothyroxine (SYNTHROID, LEVOTHROID) 100 MCG tablet Take 100 mcg by mouth daily before breakfast.    LUTEIN PO Take 1 tablet by mouth daily.   mirtazapine (REMERON) 15 MG tablet Take 15 mg by mouth at bedtime.   Multiple Vitamin (MULTIVITAMIN) tablet Take 1 tablet by mouth daily.   nitroGLYCERIN (NITROSTAT) 0.4 MG SL tablet Place 1 tablet (0.4 mg total) under the tongue every 5 (five) minutes as needed for chest pain.   Omega-3 Fatty Acids (FISH OIL PO) Take 1 capsule by mouth daily.   pravastatin (PRAVACHOL) 40 MG tablet TAKE 1 TABLET BY MOUTH DAILY GENERIC EQUIVALENT FOR PRAVACHOL   tamoxifen (NOLVADEX) 20 MG tablet TAKE 1 TABLET BY MOUTH ONCE DAILY . APPOINTMENT REQUIRED FOR FUTURE REFILLS   TOVIAZ 4 MG TB24 tablet Take 4 mg by mouth daily.  traMADol (ULTRAM) 50 MG tablet Take 0.5-1 tablets (25-50 mg total) by mouth every 8 (eight) hours as needed.   Current Facility-Administered Medications (Other)  Medication Route   Bevacizumab (AVASTIN) SOLN 1.25 mg Intravitreal   Bevacizumab (AVASTIN) SOLN 1.25 mg Intravitreal   Bevacizumab (AVASTIN) SOLN 1.25 mg Intravitreal   REVIEW OF SYSTEMS: ROS   Positive for: HENT, Endocrine, Cardiovascular, Eyes, Respiratory, Heme/Lymph Negative for: Constitutional, Gastrointestinal, Neurological, Skin, Genitourinary, Musculoskeletal, Psychiatric, Allergic/Imm Last edited by Parthenia Ames, COT on 09/18/2022  1:54  PM.     ALLERGIES Allergies  Allergen Reactions   Lipitor [Atorvastatin]     Elevated LFT's   PAST MEDICAL HISTORY Past Medical History:  Diagnosis Date   Anxiety    Arthritis    Breast cancer ([facility])    CAD (coronary artery disease)    a. 03/2017: 95% LCx stenosis --> PCI/DES placed   Cataract    Chest pain 03/27/2017   Chronic diastolic heart failure ([facility]) 06/25/2017   Chronic lower back pain    Colon cancer ([facility]) 11/2019   s/p colectomy   COPD (chronic obstructive pulmonary disease) (HCC)    Coronary artery disease    Diverticulitis    Diverticulosis    GERD (gastroesophageal reflux disease)    Glaucoma    Heart failure, type unknown ([facility]) 03/11/2017   History of radiation therapy 08/05/19- 09/03/19   Right Breast/ SCV 16 fractions of 2.66 Gy each for a total of 42.56 Gy. Right breast boost 4 fractions of 2 Gy each to total 8 Gy.    Hyperlipidemia 03/11/2017   Hypertension    Hypothyroidism    Macular degeneration    wet and dry per pt's son   Personal history of chemotherapy    Personal history of radiation therapy    Pneumonia X 1   "years and years ago" (03/26/2017)   Shortness of breath 03/11/2017   Status post dilation of esophageal narrowing    Past Surgical History:  Procedure Laterality Date   BILATERAL OOPHORECTOMY Bilateral    BREAST LUMPECTOMY Right 06/19/2019   BREAST LUMPECTOMY WITH RADIOACTIVE SEED AND SENTINEL LYMPH NODE BIOPSY Right 06/19/2019   Procedure: RIGHT BREAST LUMPECTOMY WITH RADIOACTIVE SEED AND SENTINEL LYMPH NODE BIOPSY;  Surgeon: Jovita Kussmaul, MD;  Location: [facility];  Service: General;  Laterality: Right;   CATARACT EXTRACTION     CATARACT EXTRACTION W/ INTRAOCULAR LENS  IMPLANT, BILATERAL Bilateral    COLOSTOMY     Greater than 10 yrs   CORONARY ANGIOPLASTY WITH STENT PLACEMENT  03/26/2017   LAPAROSCOPIC RIGHT COLECTOMY Right 12/15/2019   Procedure: LAPAROSCOPIC ASSISTED RIGHT COLECTOMY;  Surgeon: Jovita Kussmaul, MD;  Location: [facility];   Service: General;  Laterality: Right;   LEFT HEART CATH AND CORONARY ANGIOGRAPHY N/A 03/26/2017   Procedure: Left Heart Cath and Coronary Angiography;  Surgeon: Belva Crome, MD;  Location: [facility];  Service: Cardiovascular;  Laterality: N/A;   NERVE SURGERY     "lump on my sciatic nerve was removed years ago"   SHOULDER OPEN ROTATOR CUFF REPAIR Right    THYROIDECTOMY, PARTIAL     TONSILLECTOMY     UPPER GASTROINTESTINAL ENDOSCOPY     > ten yrs   FAMILY HISTORY Family History  Problem Relation Age of Onset   CAD Mother    Cataracts Mother    Coronary artery disease Father    Leukemia Sister    Amblyopia Neg Hx    Blindness Neg Hx  Diabetes Neg Hx    Glaucoma Neg Hx    Macular degeneration Neg Hx    Retinal detachment Neg Hx    Strabismus Neg Hx    Retinitis pigmentosa Neg Hx    Colon cancer Neg Hx    Colon polyps Neg Hx    Esophageal cancer Neg Hx    Rectal cancer Neg Hx    Stomach cancer Neg Hx    SOCIAL HISTORY Social History   Tobacco Use   Smoking status: Former    Packs/day: 0.50    Years: 60.00    Total pack years: 30.00    Types: Cigarettes   Smokeless tobacco: Never  Vaping Use   Vaping Use: Never used  Substance Use Topics   Alcohol use: Yes    Comment: twice a month   Drug use: No       OPHTHALMIC EXAM:  Base Eye Exam     Visual Acuity (Snellen - Linear)       Right Left   Dist [place] CF at 3' 20/200   Dist ph [place] NI NI         Tonometry (Tonopen, 1:58 PM)       Right Left   Pressure 18 18         Pupils       Pupils Dark Light Shape React APD   Right PERRL 4 4 Round Minimal    Left PERRL 4 3 Round Sluggish None         Neuro/Psych     Oriented x3: Yes   Mood/Affect: Normal         Dilation     Both eyes: 2.5% Phenylephrine @ 1:58 PM           Slit Lamp and Fundus Exam     Slit Lamp Exam       Right Left   Lids/Lashes Dermatochalasis - upper lid, Telangiectasia, Meibomian gland dysfunction -  improving Dermatochalasis - upper lid, Telangiectasia, Meibomian gland dysfunction   Conjunctiva/Sclera White and quiet trace Injection   Cornea Arcus, 1+ Punctate epithelial erosions, 3+ fine endo pigment, tear film debris Arcus, 3+ Punctate epithelial erosions, endo pigment / Krunkenberg's spindle, mild central haze   Anterior Chamber Deep and quiet Deep and quiet   Iris Round and dilated Round and dilated   Lens Three piece PC IOL in good position, open PC Three piece PC IOL in good position, clear PC   Anterior Vitreous Vitreous syneresis, Posterior vitreous detachment, mild vitreous condensations Mild Vitreous syneresis, Posterior vitreous detachment         Fundus Exam       Right Left   Disc sharp rim, 360 Peripapillary atrophy, 2-3+Pallor 360 Peripapillary atrophy, 1-2+pallor, sharp rim   C/D Ratio 0.2 0.1   Macula Blunted foveal reflex, Drusen, RPE mottling, clumping and atrophy, +PED, +CNVM, sub-retinal central Disciform scar with subretinal fibrosis, no heme, punctacte heme centrally Blunted foveal reflex, Drusen, RPE mottling, clumping, and atrophy, focal GA nasal macula extending into fovea--slightly increased, no heme   Vessels attenuated, mild tortuosity attenuated, mild tortuosity   Periphery Attached, reticular degeneration, No heme Attached, reticular degeneration, no heme           Refraction     Wearing Rx       Sphere Cylinder Axis   Right -1.00 +1.50 014   Left -1.25 +1.25 174           IMAGING AND PROCEDURES  Imaging and Procedures for @  TODAY@  OCT, Retina - OU - Both Eyes       Right Eye Quality was good. Central Foveal Thickness: 476. Progression has been stable. Findings include no IRF, no SRF, abnormal foveal contour, retinal drusen , subretinal hyper-reflective material, disciform scar, epiretinal membrane, pigment epithelial detachment, outer retinal atrophy (persistent thick PED/SRHM/disciform scar -- stable from prior).   Left Eye Quality  was good. Central Foveal Thickness: 257. Progression has been stable. Findings include no IRF, no SRF, abnormal foveal contour, retinal drusen , pigment epithelial detachment, outer retinal atrophy (Significant central GA / ORA-- mild progression of ORA).   Notes *Images captured and stored on drive  Diagnosis / Impression:  OD: Exudative AMD with significant submacular scar/SRHM --  persistent thick PED/SRHM/disciform scar -- no IRF/SRF OS: Significant central GA / ORA-- mild progression of ORA  Clinical management:  See below  Abbreviations: NFP - Normal foveal profile. CME - cystoid macular edema. PED - pigment epithelial detachment. IRF - intraretinal fluid. SRF - subretinal fluid. EZ - ellipsoid zone. ERM - epiretinal membrane. ORA - outer retinal atrophy. ORT - outer retinal tubulation. SRHM - subretinal hyper-reflective material             ASSESSMENT/PLAN:    ICD-10-CM   1. 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 Posterior vitreous detachment of both eyes  H43.813     4. Pigmentary glaucoma of both eyes, mild stage  H40.1331     5. Pseudophakia of both eyes  Z96.1     6. Dry eyes  H04.123      1. 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 Kathlen Mody  - S/P IVA OD #1 (05.20.19), #2 (07.02.19), #3 (07.30.19)  - S/P IVE OD #1 (08.27.19), #2 (09.24.19), #3 (11.05.19), #4 (12.17.19), #5 (02.21.20), #6 (05.05.20), # 7 (08.05.20), #8 (9.2.20), #9 (11.10.20), #10 (02.12.21), #11 (05.12.21), #12 (08.13.21)  - VA remains CF  - OCT with stable improvment in peripapillary / nasal macula IRF and SRF; persistent thick PED/SRHM/disciform scar centrally  - discussed findings and  guarded prognosis and treatment options  - due to stable, inactive submacular scar and VA CF -- recommend holding off on injection today  - pt in agreement  - Eylea informed consent form signed and scanned on 11.10.2020  - Eylea paperwork and benefits investigation started on 07.30.19  - f/u in 6 months -- DFE/OCT  2. Age related macular degeneration, non-exudative, left eye  - mild progression of geographic atrophy on exam and OCT today  - BCVA 20/100 from 20/80  - The incidence, anatomy, and pathology of dry AMD, risk of progression, and the AREDS and AREDS 2 study including smoking risks discussed with patient.  - continue Amsler grid monitoring  3. PVD / vitreous syneresis OU  Discussed findings and prognosis  No RT or RD on 360 peripheral exam  Reviewed s/s of RT/RD  Strict return precautions for any such RT/RD symptoms  4. Pigmentary glaucoma OU-   - using latanoprost OU qhs / brimonidine BID OU  - IOP good today -- 10,9  - monitor  5. Pseudophakia OU  - s/p CE/IOL OU  - beautiful surgery, doing well  -  monitor  6. Dry eyes OU  - recommend artificial tears and lubricating ointment as needed  Ophthalmic Meds Ordered this visit:  No orders of the defined types were placed in this encounter.    Return in about 6 months (around 03/19/2023) for ARMD OU, DFE, OCT .  There are no Patient Instructions on file for this visit.  This document serves as a record of services personally performed by Gardiner Sleeper, MD, PhD. It was created on their behalf by Orvan Falconer, an ophthalmic technician. The creation of this record is the provider's dictation and/or activities during the visit.    Electronically signed by: Orvan Falconer, OA, 09/19/22  12:50 AM  Gardiner Sleeper, M.D., Ph.D. Diseases & Surgery of the Retina and Vitreous [facility]  I have reviewed the above documentation for accuracy and completeness, and I agree with the above. Gardiner Sleeper, M.D., Ph.D. 09/19/22 12:52 AM  Abbreviations: M myopia (nearsighted); A astigmatism; H hyperopia (farsighted); P presbyopia; Mrx spectacle prescription;  CTL contact lenses; OD right eye; OS left eye; OU both eyes  XT exotropia; ET esotropia; PEK punctate epithelial keratitis; PEE punctate epithelial erosions; DES dry eye syndrome; MGD meibomian gland dysfunction; ATs artificial tears; PFAT's preservative free artificial tears; [place] nuclear sclerotic cataract; PSC posterior subcapsular cataract; ERM epi-retinal membrane; PVD posterior vitreous detachment; RD retinal detachment; DM diabetes mellitus; DR diabetic retinopathy; NPDR non-proliferative diabetic retinopathy; PDR proliferative diabetic retinopathy; CSME clinically significant macular edema; DME diabetic macular edema; dbh dot blot hemorrhages; CWS cotton wool spot; POAG primary open angle glaucoma; C/D cup-to-disc ratio; HVF humphrey visual field; GVF goldmann visual field; OCT optical coherence tomography; IOP intraocular pressure; BRVO Branch retinal vein occlusion; CRVO central retinal vein occlusion; CRAO central retinal artery occlusion; BRAO branch retinal artery occlusion; RT retinal tear; SB scleral buckle; PPV pars plana vitrectomy; VH Vitreous hemorrhage; PRP panretinal laser photocoagulation; IVK intravitreal kenalog; VMT vitreomacular traction; MH Macular hole;  NVD neovascularization of the disc; NVE neovascularization elsewhere; AREDS age related eye disease study; ARMD age related macular degeneration; POAG primary open angle glaucoma; EBMD epithelial/anterior basement membrane dystrophy; ACIOL anterior chamber intraocular lens; IOL intraocular lens; PCIOL posterior chamber intraocular lens; Phaco/IOL phacoemulsification with intraocular lens placement; [place] photorefractive keratectomy; LASIK laser assisted in situ keratomileusis; HTN hypertension; DM diabetes mellitus; COPD chronic obstructive pulmonary disease

## 2022-09-14 ENCOUNTER — Other Ambulatory Visit: Payer: Self-pay

## 2022-09-14 DIAGNOSIS — C182 Malignant neoplasm of ascending colon: Secondary | ICD-10-CM

## 2022-09-14 DIAGNOSIS — Z170 Estrogen receptor positive status [ER+]: Secondary | ICD-10-CM

## 2022-09-15 ENCOUNTER — Encounter: Payer: Self-pay | Admitting: Hematology

## 2022-09-15 ENCOUNTER — Other Ambulatory Visit: Payer: Self-pay

## 2022-09-15 ENCOUNTER — Inpatient Hospital Stay: Payer: Medicare Other | Attending: Family Medicine

## 2022-09-15 ENCOUNTER — Inpatient Hospital Stay (HOSPITAL_BASED_OUTPATIENT_CLINIC_OR_DEPARTMENT_OTHER): Payer: Medicare Other | Admitting: Hematology

## 2022-09-15 VITALS — BP 145/66 | HR 79 | Temp 98.6°F | Resp 16 | Wt 135.4 lb

## 2022-09-15 DIAGNOSIS — C50411 Malignant neoplasm of upper-outer quadrant of right female breast: Secondary | ICD-10-CM | POA: Insufficient documentation

## 2022-09-15 DIAGNOSIS — R918 Other nonspecific abnormal finding of lung field: Secondary | ICD-10-CM | POA: Insufficient documentation

## 2022-09-15 DIAGNOSIS — Z9049 Acquired absence of other specified parts of digestive tract: Secondary | ICD-10-CM | POA: Diagnosis not present

## 2022-09-15 DIAGNOSIS — Z923 Personal history of irradiation: Secondary | ICD-10-CM | POA: Diagnosis not present

## 2022-09-15 DIAGNOSIS — Z79810 Long term (current) use of selective estrogen receptor modulators (SERMs): Secondary | ICD-10-CM | POA: Insufficient documentation

## 2022-09-15 DIAGNOSIS — R911 Solitary pulmonary nodule: Secondary | ICD-10-CM | POA: Diagnosis not present

## 2022-09-15 DIAGNOSIS — Z85038 Personal history of other malignant neoplasm of large intestine: Secondary | ICD-10-CM | POA: Insufficient documentation

## 2022-09-15 DIAGNOSIS — Z170 Estrogen receptor positive status [ER+]: Secondary | ICD-10-CM

## 2022-09-15 DIAGNOSIS — C182 Malignant neoplasm of ascending colon: Secondary | ICD-10-CM

## 2022-09-15 DIAGNOSIS — Z1231 Encounter for screening mammogram for malignant neoplasm of breast: Secondary | ICD-10-CM | POA: Diagnosis not present

## 2022-09-15 DIAGNOSIS — J449 Chronic obstructive pulmonary disease, unspecified: Secondary | ICD-10-CM | POA: Insufficient documentation

## 2022-09-15 DIAGNOSIS — Z720 Tobacco use: Secondary | ICD-10-CM | POA: Insufficient documentation

## 2022-09-15 LAB — CMP (CANCER CENTER ONLY)
ALT: 14 U/L (ref 0–44)
AST: 22 U/L (ref 15–41)
Albumin: 3.7 g/dL (ref 3.5–5.0)
Alkaline Phosphatase: 42 U/L (ref 38–126)
Anion gap: 3 — ABNORMAL LOW (ref 5–15)
BUN: 24 mg/dL — ABNORMAL HIGH (ref 8–23)
CO2: 30 mmol/L (ref 22–32)
Calcium: 9.5 mg/dL (ref 8.9–10.3)
Chloride: 106 mmol/L (ref 98–111)
Creatinine: 0.85 mg/dL (ref 0.44–1.00)
GFR, Estimated: >60 mL/min (ref >60)
Glucose, Bld: 99 mg/dL (ref 70–99)
Potassium: 4.3 mmol/L (ref 3.5–5.1)
Sodium: 139 mmol/L (ref 135–145)
Total Bilirubin: 0.4 mg/dL (ref 0.3–1.2)
Total Protein: 6.8 g/dL (ref 6.5–8.1)

## 2022-09-15 LAB — CBC WITH DIFFERENTIAL (CANCER CENTER ONLY)
Abs Immature Granulocytes: 0.01 10*3/uL (ref 0.00–0.07)
Basophils Absolute: 0.1 10*3/uL (ref 0.0–0.1)
Basophils Relative: 1 %
Eosinophils Absolute: 0.1 10*3/uL (ref 0.0–0.5)
Eosinophils Relative: 2 %
HCT: 37.1 % (ref 36.0–46.0)
Hemoglobin: 12.7 g/dL (ref 12.0–15.0)
Immature Granulocytes: 0 %
Lymphocytes Relative: 20 %
Lymphs Abs: 1.2 10*3/uL (ref 0.7–4.0)
MCH: 32.3 pg (ref 26.0–34.0)
MCHC: 34.2 g/dL (ref 30.0–36.0)
MCV: 94.4 fL (ref 80.0–100.0)
Monocytes Absolute: 0.9 10*3/uL (ref 0.1–1.0)
Monocytes Relative: 15 %
Neutro Abs: 3.8 10*3/uL (ref 1.7–7.7)
Neutrophils Relative %: 62 %
Platelet Count: 190 10*3/uL (ref 150–400)
RBC: 3.93 MIL/uL (ref 3.87–5.11)
RDW: 13.9 % (ref 11.5–15.5)
WBC Count: 6.1 10*3/uL (ref 4.0–10.5)
nRBC: 0 % (ref 0.0–0.2)

## 2022-09-15 MED ORDER — TRAMADOL HCL 50 MG PO TABS: 25.0000 mg | ORAL_TABLET | Freq: Three times a day (TID) | ORAL | 0 refills | Status: AC | PRN

## 2022-09-15 MED ORDER — TAMOXIFEN CITRATE 20 MG PO TABS: ORAL_TABLET | ORAL | 3 refills | Status: DC

## 2022-09-15 NOTE — Progress Notes (Signed)
[facility]   [phone] [phone]   Clinic Follow up Note   Patient Care Team: [name], MD as PCP - General (Family Medicine) Skeet Latch, MD as PCP - Cardiology (Cardiology) Rockwell Germany, RN as Oncology Nurse Navigator Mauro Kaufmann, RN as Oncology Nurse Navigator Jovita Kussmaul, MD as Consulting Physician (General Surgery) [name], MD as Consulting Physician (Hematology) Eppie Gibson, MD as Attending Physician (Radiation Oncology)  Date of Service:  09/15/2022  CHIEF COMPLAINT: f/u of colon and right breast cancers   CURRENT THERAPY: Tamoxifen '20mg'$  once daily since 03/2019. Held 05/2019-07/2019 for surgery.     ASSESSMENT:  Marissa Caldwell is a 89 y.o. female with   Malignant neoplasm of upper-outer quadrant of right breast in female, estrogen receptor positive ([place]) pT1cN2aMx, ER/PR+, HER2-, Grade II -s/p right breast lumpectomy by Dr Marlou Starks on 06/19/19, path showed 2 cm mucinous carcinoma and three positive lymph nodes (two with ECE) plus one with micrometastasis. Margins negative. -she received adjuvant RT with Dr. Isidore Moos 08/05/19-09/03/19.  -She started Tamoxifen in 03/2019 and held 05/2019-07/2019 for surgery.  -most recent MM 04/05/21 was benign -From a breast cancer standpoint, she is stable.  -Continue Tamoxifen and surveillance with yearly mammograms  Cancer of right colon ([place])  pT2N0M0, stage I  -She was diagnosed in 09/2019 by colonoscopy with Dr Silverio Decamp. -She underwent right colectomy on 12/15/19 with Dr. Marlou Starks. 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 Negative margins and LNs.  -restaging PET 06/13/21 was NED -continue cancer surveillance, no need for routine surveillance scans.  Lung nodule COPD, history of heavy smoking, bilateral lung nodules  -On inhalers, not on home oxygen  -She still smokes occasionally. She has not completely  quit.   -PET from 07/22/19 shows 70m ground-glass nodules in RU lung. -nodules appear stable on 06/13/21 PET. Will repeat CT chest in the next months   PLAN: - refill Tamoxifen for 3 months - mammogram due 03/2023 - refill tramadol - encourage pt to eat more  - labs reviewed - order CT chest without contrast to be done in the next months - f/u in six months    SUMMARY OF ONCOLOGIC HISTORY: Oncology History Overview Note  Cancer Staging Cancer of right colon ([facility] Staging form: Colon and Rectum, AJCC 8th Edition - Pathologic stage from 12/15/2019: Stage I (pT2, pN0, cM0) - Signed by FTruitt Merle MD on 01/08/2020  Malignant neoplasm of upper-outer quadrant of right breast in female, estrogen receptor positive ([place] Staging form: Breast, AJCC 8th Edition - Clinical: Stage IIA (cT1c, cN1, cM0, G3, ER+, PR+, HER2-) - Signed by FTruitt Merle MD on 04/09/2019 - Pathologic stage from 06/19/2019: Stage IB (pT1c, pN2a, cM0, G2, ER+, PR+, HER2-) - Signed by FTruitt Merle MD on 07/03/2019    Malignant neoplasm of upper-outer quadrant of right breast in female, estrogen receptor positive ([place]  03/19/2019 Mammogram   Diagnostic Mammogram 03/19/19  IMPRESSION The 2 cm distortion in the  right breast 9:30 position middle depth 3cm from the nipple is indeterminate. UKorearecommended.  There is also a 1.2cmx3.2cm enlarged right axillary LN highly suggestive of malignancy.     03/31/2019 Initial Biopsy   Diagnosis 03/31/19  1. Breast, right, needle core biopsy, 9:30 o'clock, mid depth, 3cmfn - INVASIVE DUCTAL CARCINOMA. - LYMPHOVASCULAR INVASION IS IDENTIFIED. - SEE COMMENT. 2. Lymph node, needle/core biopsy, right axilla - INVASIVE DUCTAL CARCINOMA. - SEE COMMENT.   03/31/2019 Receptors her2  1. PROGNOSTIC INDICATORS Results: IMMUNOHISTOCHEMICAL AND MORPHOMETRIC ANALYSIS PERFORMED MANUALLY The tumor cells are NEGATIVE for Her2 (0). Estrogen Receptor: 100%, POSITIVE, STRONG STAINING  INTENSITY Progesterone Receptor: 10%, POSITIVE, STRONG STAINING INTENSITY Proliferation Marker Ki67: 10%  2. PROGNOSTIC INDICATORS Results: IMMUNOHISTOCHEMICAL AND MORPHOMETRIC ANALYSIS PERFORMED MANUALLY Estrogen Receptor: 100%, POSITIVE, STRONG STAINING INTENSITY Progesterone Receptor: 0%, NEGATIVE Proliferation Marker Ki67: 40%   04/03/2019 Initial Diagnosis   Malignant neoplasm of upper-outer quadrant of right breast in female, estrogen receptor positive ([place])   04/09/2019 Cancer Staging   Staging form: Breast, AJCC 8th Edition - Clinical: Stage IIA (cT1c, cN1, cM0, G3, ER+, PR+, HER2-) - Signed by [name], MD on 04/09/2019  Staging form: Breast, AJCC 8th Edition - Clinical: Stage IIA (cT1c, cN1, cM0, G3, ER+, PR+, HER2-) - Signed by [name], MD on 04/09/2019   03/2019 -  Anti-estrogen oral therapy   Tamoxifen '20mg'$  daily starting 03/2019. Held 05/30/19 -07/2019 for surgery.   06/19/2019 Surgery   RIGHT BREAST LUMPECTOMY WITH RADIOACTIVE SEED AND SENTINEL LYMPH NODE BIOPSY by Dr Marlou Starks  06/19/19    06/19/2019 Pathology Results   FINAL MICROSCOPIC DIAGNOSIS: 06/19/19  A. BREAST, RIGHT, LUMPECTOMY:  -  Mucinous carcinoma, Nottingham grade 2  -  Margins uninvolved by carcinoma (0.2 cm; medial margin)  -  Lymphovascular space invasion present  -  Previous biopsy site changes present  -  See oncology table and comment below   B. LYMPH NODE, RIGHT AXILLARY #1, SENTINEL, BIOPSY:  -  Metastatic carcinoma involving two lymph nodes (2/2)  -  Extracapsular extension present  -  See comment   C. BREAST, RIGHT MEDIAL MARGIN, EXCISION:  -  Residual invasive carcinoma  -  Usual ductal hyperplasia with calcifications  -  See comment   D. LYMPH NODE, RIGHT AXILLARY #2, SENTINEL, BIOPSY:  -  Metastatic carcinoma involving one lymph node (1/1)   E. LYMPH NODE, RIGHT AXILLARY #1 , BIOPSY:  -  Metastatic carcinoma involving one lymph node (1/1)   F. LYMPH NODE, RIGHT AXILLARY #2,  BIOPSY:  -  No carcinoma identified in one lymph node (0/1)    06/19/2019 Cancer Staging   Staging form: Breast, AJCC 8th Edition - Pathologic stage from 06/19/2019: Stage IB (pT1c, pN2a, cM0, G2, ER+, PR+, HER2-) - Signed by [name], MD on 07/03/2019   07/22/2019 PET scan   IMPRESSION: 1. Low level hypermetabolism identified in the surgical beds of the right breast and right axilla. This is presumably related to the surgery. 2. Tiny right subpectoral lymph nodes are asymmetric in concerning for metastatic disease on CT imaging. No hypermetabolism on the PET study although size of these lymph nodes is below reliable resolution on PET imaging. 3. 10 mm short axis precarinal lymph node shows low level hypermetabolism. Metastatic involvement not excluded. 4. 11 mm ground-glass nodule in right upper lobe shows low level FDG accumulation. Well differentiated or low-grade neoplasm is a distinct concern. Close follow-up recommended. 5. Focal hypermetabolism in the right colon, potentially related to the ileocecal valve but incompletely characterized. Adenoma or carcinoma could have this appearance. Correlation with colorectal cancer screening history recommended. 6. Focal hypermetabolism identified in the region of the left pubic bone and adjacent sigmoid colon. No underlying bony or colonic lesion evident. Metastatic involvement of the left pubic bone not excluded. 7.  Aortic Atherosclerois (ICD10-170.0)     08/05/2019 - 09/03/2019 Radiation Therapy   Adjuvant RT with Dr Isidore Moos 08/05/19-09/03/19.    06/13/2021 PET scan   IMPRESSION:  1. Sub solid right upper lobe pulmonary nodule shows low level hypermetabolism. Neoplasm not excluded. 2. 11 mm ground-glass nodule superior segment left lower lobe shows very low level FDG accumulation. Findings may be infectious/inflammatory. As well differentiated in low-grade neoplasm can be poorly FDG avid, continued close  attention recommended. 3. No evidence for hypermetabolic metastatic disease in the abdomen or pelvis. 4.  Aortic Atherosclerois (ICD10-170.0) 5.  Emphysema. ([id number]-U04.9)   Cancer of right colon ([place])  10/15/2019 Procedure   Colonoscopy by Dr Silverio Decamp  IMPRESSION - Likely malignant tumor in the proximal ascending colon. Biopsied. Tattooed. - Severe diverticulosis in the sigmoid colon, in the descending colon, in the transverse colon and in the ascending colon. - Non-bleeding internal hemorrhoids.   10/15/2019 Initial Biopsy   Diagnosis Ascending Colon Polyp, mass - ADENOCARCINOMA. Microscopic Comment IHC for MMR will be reported separately. Results reported to Dr. Harl Bowie on 10/16/2019. Intradepartmental consultation (Dr. Vic Ripper).   12/15/2019 Initial Diagnosis   Cancer of right colon ([place])   12/15/2019 Cancer Staging   Staging form: Colon and Rectum, AJCC 8th Edition - Pathologic stage from 12/15/2019: Stage I (pT2, pN0, cM0) - Signed by [name], MD on 01/08/2020   12/15/2019 Surgery   LAPAROSCOPIC ASSISTED RIGHT COLECTOMY by Dr Marlou Starks and Dr Marcello Moores   12/15/2019 Pathology Results   FINAL MICROSCOPIC DIAGNOSIS:   A. COLON, TERMINAL ILEUM AND RIGHT, COLECTOMY:  - Invasive moderately differentiated adenocarcinoma, 4.6 cm, involving  ascending colon  - Carcinoma invades into deeper muscularis propria but not beyond  - Resection margins are negative for carcinoma  - Negative for lymphovascular or perineural invasion  - Unremarkable appendix  - Nineteen lymph nodes, negative for carcinoma (0/19)  - See oncology table    12/15/2019 Genetic Testing   ADDENDUM:   Mismatch Repair Protein (IHC)  SUMMARY INTERPRETATION: ABNORMAL  There is loss of the major and minor MMR proteins MLH1 and PMS2. The  loss of expression may be secondary to promoter hyper-methylation, gene  mutation or other genetic event. BRAF mutation testing and/or MLH1  methylation testing is indicated.  The presence of a BRAF mutation and/or  MLH1 hypermethylation is indicative of a sporadic-type tumor. The  absence of either BRAF mutation and/or presence of normal methylation  indicate the possible presence of a hereditary germline mutation (e.g.  Lynch syndrome) and referral to genetic counseling is warranted. It is  recommended that the loss of protein expression be correlated with  molecular based MSI testing.   IHC EXPRESSION RESULTS  TEST           RESULT  MLH1:          LOSS OF NUCLEAR EXPRESSION  MSH2:          Preserved nuclear expression  MSH6:          Preserved nuclear expression  PMS2:          LOSS OF NUCLEAR EXPRESSION    12/29/2019 Imaging   CT AP W contrast  IMPRESSION: 1. Sigmoid diverticulosis without inflammation. Status post right colectomy. 2. Multiple small calcified uterine fibroids. 3. No acute abnormality seen in the abdomen or pelvis.   Aortic Atherosclerosis (ICD10-I70.0).     01/28/2020 Imaging   CT AP w contrast  IMPRESSION: 1. Moderate irregular wall thickening of the right colon with some areas of low attenuation. Findings could be due to cecal diverticulitis or other inflammatory or infectious colitis. I doubt that this represents recurrent colon cancer. 2. Status post partial right  colectomy. No findings for locoregional adenopathy or metastatic disease. 3. Stable advanced atherosclerotic calcifications involving the aorta and branch vessels. 4. Stable benign-appearing central hepatic cysts. 5. Stable calcified uterine fibroids. 6. Aortic atherosclerosis.   Aortic Atherosclerosis (ICD10-I70.0).   04/30/2020 Imaging   CT Chest  IMPRESSION: 1. Ground-glass nodule in the RIGHT upper lobe measuring 1.4 x 0.9 cm previously 1.4 x 0.9 cm. 2. Ground-glass nodule in the LEFT upper lobe measuring 11 x 12 mm, spiculated margins and mild fissural distortion, accompanying features of this nodule. Findings are concerning for for  multifocal bronchogenic neoplasm given persistence. 3. Stable size of small nodes along the RIGHT paratracheal chain largest approximately 9 mm. 4. Stable hepatic hemangioma. 5. Dilation of the ascending aorta to 3.7 cm, similar to previous exams. 6. Three-vessel coronary artery disease. 7. Aortic atherosclerosis.   Aortic Atherosclerosis (ICD10-I70.0).     02/01/2021 Imaging   CT CAP  IMPRESSION: No evidence of recurrent or metastatic carcinoma within the abdomen or pelvis. No evidence of thoracic metastatic disease.   Stable hepatic hemangioma.   Stable small calcified uterine fibroids.   Colonic diverticulosis. No radiographic evidence of diverticulitis.   Sub-solid pulmonary nodule in anterior right upper lobe shows increased solid component, suspicious for primary lung adenocarcinoma. PET-CT is recommended for further evaluation.   Stable 11 mm ground-glass nodule in superior segment of left lower lobe. Recommend continued attention on follow-up imaging, and this could also be assessed by PET-CT.   Aortic Atherosclerosis (ICD10-I70.0) and Emphysema (ICD10-J43.9).     06/13/2021 PET scan   IMPRESSION: 1. Sub solid right upper lobe pulmonary nodule shows low level hypermetabolism. Neoplasm not excluded. 2. 11 mm ground-glass nodule superior segment left lower lobe shows very low level FDG accumulation. Findings may be infectious/inflammatory. As well differentiated in low-grade neoplasm can be poorly FDG avid, continued close attention recommended. 3. No evidence for hypermetabolic metastatic disease in the abdomen or pelvis. 4.  Aortic Atherosclerois (ICD10-170.0) 5.  Emphysema. (NFA21-H08.9)      INTERVAL HISTORY:  Marissa Caldwell is here for a follow up of colon and right breast cancers.  She was last seen by me on 11/18/2021.  She presents to the clinic with her daughter in law. Patient is having issues with back pain, no issues with coughing, pt states  under her nipple on the left breast she has a pain,     All other systems were reviewed with the patient and are negative.  MEDICAL HISTORY:  Past Medical History:  Diagnosis Date   Anxiety    Arthritis    Breast cancer ([place])    CAD (coronary artery disease)    a. 03/2017: 95% LCx stenosis --> PCI/DES placed   Cataract    Chest pain 03/27/2017   Chronic diastolic heart failure ([place]) 06/25/2017   Chronic lower back pain    Colon cancer ([place]) 11/2019   s/p colectomy   COPD (chronic obstructive pulmonary disease) ([place])    Coronary artery disease    Diverticulitis    Diverticulosis    GERD (gastroesophageal reflux disease)    Glaucoma    Heart failure, type unknown ([place]) 03/11/2017   History of radiation therapy 08/05/19- 09/03/19   Right Breast/ SCV 16 fractions of 2.66 Gy each for a total of 42.56 Gy. Right breast boost 4 fractions of 2 Gy each to total 8 Gy.    Hyperlipidemia 03/11/2017   Hypertension    Hypothyroidism    Macular degeneration    wet and  dry per pt's son   Personal history of chemotherapy    Personal history of radiation therapy    Pneumonia X 1   "years and years ago" (03/26/2017)   Shortness of breath 03/11/2017   Status post dilation of esophageal narrowing     SURGICAL HISTORY: Past Surgical History:  Procedure Laterality Date   BILATERAL OOPHORECTOMY Bilateral    BREAST LUMPECTOMY Right 06/19/2019   BREAST LUMPECTOMY WITH RADIOACTIVE SEED AND SENTINEL LYMPH NODE BIOPSY Right 06/19/2019   Procedure: RIGHT BREAST LUMPECTOMY WITH RADIOACTIVE SEED AND SENTINEL LYMPH NODE BIOPSY;  Surgeon: Jovita Kussmaul, MD;  Location: [facility];  Service: General;  Laterality: Right;   CATARACT EXTRACTION     CATARACT EXTRACTION W/ INTRAOCULAR LENS  IMPLANT, BILATERAL Bilateral    COLOSTOMY     Greater than 10 yrs   CORONARY ANGIOPLASTY WITH STENT PLACEMENT  03/26/2017   LAPAROSCOPIC RIGHT COLECTOMY Right 12/15/2019   Procedure: LAPAROSCOPIC ASSISTED RIGHT COLECTOMY;  Surgeon:  Jovita Kussmaul, MD;  Location: [facility];  Service: General;  Laterality: Right;   LEFT HEART CATH AND CORONARY ANGIOGRAPHY N/A 03/26/2017   Procedure: Left Heart Cath and Coronary Angiography;  Surgeon: Belva Crome, MD;  Location: [facility];  Service: Cardiovascular;  Laterality: N/A;   NERVE SURGERY     "lump on my sciatic nerve was removed years ago"   SHOULDER OPEN ROTATOR CUFF REPAIR Right    THYROIDECTOMY, PARTIAL     TONSILLECTOMY     UPPER GASTROINTESTINAL ENDOSCOPY     > ten yrs    I have reviewed the social history and family history with the patient and they are unchanged from previous note.  ALLERGIES:  is allergic to lipitor [atorvastatin].  MEDICATIONS:  Current Outpatient Medications  Medication Sig Dispense Refill   albuterol (PROVENTIL HFA;VENTOLIN HFA) 108 (90 Base) MCG/ACT inhaler Inhale 2 puffs into the lungs every 6 (six) hours as needed (for shortness of breath/wheezing.).      aspirin EC 81 MG tablet Take 81 mg by mouth daily.     brimonidine (ALPHAGAN) 0.2 % ophthalmic solution Place 1 drop into the left eye 2 (two) times daily.      budesonide-formoterol (SYMBICORT) 160-4.5 MCG/ACT inhaler Inhale 2 puffs into the lungs 2 (two) times daily.     Calcium Carb-Cholecalciferol (CALCIUM + D3) 600-200 MG-UNIT TABS Take 1 tablet by mouth daily.     Cholecalciferol (VITAMIN D3) 2000 units capsule Take 4,000 Units by mouth daily.     Coenzyme Q10 (COQ10) 100 MG CAPS Take 100 mg by mouth daily.     diclofenac (CATAFLAM) 50 MG tablet Take 1 tablet (50 mg total) by mouth daily after breakfast. 30 tablet 3   gabapentin (NEURONTIN) 100 MG capsule Take 1 capsule (100 mg total) by mouth at bedtime. 30 capsule 3   latanoprost (XALATAN) 0.005 % ophthalmic solution Place 1 drop into both eyes at bedtime.     levothyroxine (SYNTHROID, LEVOTHROID) 100 MCG tablet Take 100 mcg by mouth daily before breakfast.   0   LUTEIN PO Take 1 tablet by mouth daily.     mirtazapine  (REMERON) 15 MG tablet Take 15 mg by mouth at bedtime.     Multiple Vitamin (MULTIVITAMIN) tablet Take 1 tablet by mouth daily.     nitroGLYCERIN (NITROSTAT) 0.4 MG SL tablet Place 1 tablet (0.4 mg total) under the tongue every 5 (five) minutes as needed for chest pain. 25 tablet 3   Omega-3 Fatty  Acids (FISH OIL PO) Take 1 capsule by mouth daily.     pravastatin (PRAVACHOL) 40 MG tablet TAKE 1 TABLET BY MOUTH DAILY GENERIC EQUIVALENT FOR PRAVACHOL 90 tablet 0   tamoxifen (NOLVADEX) 20 MG tablet TAKE 1 TABLET BY MOUTH ONCE DAILY . APPOINTMENT REQUIRED FOR FUTURE REFILLS 90 tablet 3   TOVIAZ 4 MG TB24 tablet Take 4 mg by mouth daily.   1   traMADol (ULTRAM) 50 MG tablet Take 0.5-1 tablets (25-50 mg total) by mouth every 8 (eight) hours as needed. 30 tablet 0   Current Facility-Administered Medications  Medication Dose Route Frequency Provider Last Rate Last Admin   aflibercept (EYLEA) SOLN 2 mg  2 mg Intravitreal  Bernarda Caffey, MD   2 mg at 04/16/18 1424   aflibercept (EYLEA) SOLN 2 mg  2 mg Intravitreal  Bernarda Caffey, MD   2 mg at 05/14/18 1358   aflibercept (EYLEA) SOLN 2 mg  2 mg Intravitreal  Bernarda Caffey, MD   2 mg at 06/25/18 1433   aflibercept (EYLEA) SOLN 2 mg  2 mg Intravitreal  Bernarda Caffey, MD   2 mg at 08/07/18 1557   aflibercept (EYLEA) SOLN 2 mg  2 mg Intravitreal  Bernarda Caffey, MD   2 mg at 10/11/18 1522   Bevacizumab (AVASTIN) SOLN 1.25 mg  1.25 mg Intravitreal  Bernarda Caffey, MD   1.25 mg at 01/07/18 1429   Bevacizumab (AVASTIN) SOLN 1.25 mg  1.25 mg Intravitreal  Bernarda Caffey, MD   1.25 mg at 02/19/18 1356   Bevacizumab (AVASTIN) SOLN 1.25 mg  1.25 mg Intravitreal  Bernarda Caffey, MD   1.25 mg at 03/19/18 1347    PHYSICAL EXAMINATION: ECOG PERFORMANCE STATUS: 2 - Symptomatic, <50% confined to bed  Vitals:   09/15/22 1400  BP: (!) 145/66  Pulse: 79  Resp: 16  Temp: 98.6 F (37 C)  SpO2: 97%   Wt Readings from Last 3 Encounters:  09/15/22 135 lb 6.4 oz (61.4 kg)   05/15/22 140 lb (63.5 kg)  04/14/22 149 lb 8 oz (67.8 kg)    {NECK: (-)supple, thyroid normal size, non-tender, without nodularity LYMPH: (-) no palpable lymphadenopathy in the cervical, axillary  LUNGS: clear to auscultation and percussion with normal breathing effort HEART: regular rate & rhythm and no murmurs and no lower extremity edema ABDOMEN:(-)abdomen soft, non-tender and normal bowel sounds BREAST: pain in right breast, no palpable mass, left breast no palpable mass breast exam benign   LABORATORY DATA:  I have reviewed the data as listed    Latest Ref Rng & Units 09/15/2022    1:38 PM 01/09/2022   10:02 AM 10/20/2021    9:54 AM  CBC  WBC 4.0 - 10.5 K/uL 6.1  5.9  7.0   Hemoglobin 12.0 - 15.0 g/dL 12.7  13.4  13.1   Hematocrit 36.0 - 46.0 % 37.1  39.8  38.3   Platelets 150 - 400 K/uL 190  229  179         Latest Ref Rng & Units 09/15/2022    1:38 PM 01/09/2022   10:02 AM 10/20/2021    9:54 AM  CMP  Glucose 70 - 99 mg/dL 99  111  87   BUN 8 - 23 mg/dL '24  10  27   '$ Creatinine 0.44 - 1.00 mg/dL 0.85  0.70  0.85   Sodium 135 - 145 mmol/L 139  138  140   Potassium 3.5 - 5.1 mmol/L 4.3  3.8  4.5   Chloride 98 - 111 mmol/L 106  102  105   CO2 22 - 32 mmol/L '30  23  31   '$ Calcium 8.9 - 10.3 mg/dL 9.5  9.4  9.7   Total Protein 6.5 - 8.1 g/dL 6.8  7.3  7.2   Total Bilirubin 0.3 - 1.2 mg/dL 0.4  0.5  0.4   Alkaline Phos 38 - 126 U/L 42  44  47   AST 15 - 41 U/L '22  23  21   '$ ALT 0 - 44 U/L '14  13  11       '$ RADIOGRAPHIC STUDIES: I have personally reviewed the radiological images as listed and agreed with the findings in the report. No results found.    Orders Placed This Encounter  Procedures   MM Digital Screening    Standing Status:   Future    Standing Expiration Date:   09/15/2023    Order Specific Question:   Reason for Exam (SYMPTOM  OR DIAGNOSIS REQUIRED)    Answer:   screening    Order Specific Question:   Preferred imaging location?    Answer:   [facility]   CT Chest Wo Contrast    Standing Status:   Future    Standing Expiration Date:   09/15/2023    Order Specific Question:   Preferred imaging location?    Answer:   [facility]   All questions were answered. The patient knows to call the clinic with any problems, questions or concerns. No barriers to learning was detected. The total time spent in the appointment was 30 minutes.     [name], MD 09/15/2022   I, Maurine Simmering, CMA, am acting as scribe for [name], MD.   I have reviewed the above documentation for accuracy and completeness, and I agree with the above.

## 2022-09-15 NOTE — Assessment & Plan Note (Signed)
COPD, history of heavy smoking, bilateral lung nodules  -On inhalers, not on home oxygen  -She still smokes occasionally. She has not completely quit.   -PET from 07/22/19 shows 43m ground-glass nodules in RU lung. -nodules appear stable on 06/13/21 PET. Will repeat CT chest in a year

## 2022-09-15 NOTE — Assessment & Plan Note (Signed)
pT1cN2aMx, ER/PR+, HER2-, Grade II -s/p right breast lumpectomy by Dr Marlou Starks on 06/19/19, path showed 2 cm mucinous carcinoma and three positive lymph nodes (two with ECE) plus one with micrometastasis. Margins negative. -she received adjuvant RT with Dr. Isidore Moos 08/05/19-09/03/19.  -She started Tamoxifen in 03/2019 and held 05/2019-07/2019 for surgery.  -most recent MM 04/05/21 was benign -From a breast cancer standpoint, she is stable.  -Continue Tamoxifen and surveillance with yearly mammograms

## 2022-09-15 NOTE — Assessment & Plan Note (Signed)
pT2N0M0, stage I  -She was diagnosed in 09/2019 by colonoscopy with Dr Silverio Decamp. -She underwent right colectomy on 12/15/19 with Dr. Marlou Starks. 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 Negative margins and LNs.  -restaging PET 06/13/21 was NED -continue cancer surveillance, no need for routine surveillance scans.

## 2022-09-18 ENCOUNTER — Ambulatory Visit (INDEPENDENT_AMBULATORY_CARE_PROVIDER_SITE_OTHER): Payer: Medicare Other | Admitting: Ophthalmology

## 2022-09-18 ENCOUNTER — Telehealth: Payer: Self-pay | Admitting: Hematology

## 2022-09-18 DIAGNOSIS — H353122 Nonexudative age-related macular degeneration, left eye, intermediate dry stage: Secondary | ICD-10-CM

## 2022-09-18 DIAGNOSIS — H43813 Vitreous degeneration, bilateral: Secondary | ICD-10-CM

## 2022-09-18 DIAGNOSIS — H353213 Exudative age-related macular degeneration, right eye, with inactive scar: Secondary | ICD-10-CM

## 2022-09-18 DIAGNOSIS — H04123 Dry eye syndrome of bilateral lacrimal glands: Secondary | ICD-10-CM

## 2022-09-18 DIAGNOSIS — H353124 Nonexudative age-related macular degeneration, left eye, advanced atrophic with subfoveal involvement: Secondary | ICD-10-CM | POA: Diagnosis not present

## 2022-09-18 DIAGNOSIS — H401331 Pigmentary glaucoma, bilateral, mild stage: Secondary | ICD-10-CM | POA: Diagnosis not present

## 2022-09-18 DIAGNOSIS — Z961 Presence of intraocular lens: Secondary | ICD-10-CM

## 2022-09-18 NOTE — Telephone Encounter (Signed)
Spoke with patient son confirming upcoming appointments

## 2022-09-19 ENCOUNTER — Encounter (INDEPENDENT_AMBULATORY_CARE_PROVIDER_SITE_OTHER): Payer: Self-pay | Admitting: Ophthalmology

## 2022-10-04 DIAGNOSIS — E785 Hyperlipidemia, unspecified: Secondary | ICD-10-CM | POA: Diagnosis not present

## 2022-10-04 DIAGNOSIS — G4700 Insomnia, unspecified: Secondary | ICD-10-CM | POA: Diagnosis not present

## 2022-10-04 DIAGNOSIS — E039 Hypothyroidism, unspecified: Secondary | ICD-10-CM | POA: Diagnosis not present

## 2022-10-04 DIAGNOSIS — E441 Mild protein-calorie malnutrition: Secondary | ICD-10-CM | POA: Diagnosis not present

## 2022-10-04 DIAGNOSIS — M5136 Other intervertebral disc degeneration, lumbar region: Secondary | ICD-10-CM | POA: Diagnosis not present

## 2022-10-04 DIAGNOSIS — F419 Anxiety disorder, unspecified: Secondary | ICD-10-CM | POA: Diagnosis not present

## 2022-10-17 ENCOUNTER — Ambulatory Visit (HOSPITAL_COMMUNITY)
Admission: RE | Admit: 2022-10-17 | Discharge: 2022-10-17 | Disposition: A | Discharge status: Home / Self Care | Payer: Medicare Other | Source: Ambulatory Visit | Attending: Hematology | Admitting: Hematology

## 2022-10-17 DIAGNOSIS — R911 Solitary pulmonary nodule: Secondary | ICD-10-CM | POA: Diagnosis not present

## 2022-10-17 DIAGNOSIS — J439 Emphysema, unspecified: Secondary | ICD-10-CM | POA: Diagnosis not present

## 2022-10-19 ENCOUNTER — Encounter: Payer: Self-pay | Admitting: Hematology

## 2022-10-19 ENCOUNTER — Telehealth: Payer: Self-pay | Admitting: Hematology

## 2022-10-19 ENCOUNTER — Inpatient Hospital Stay: Payer: Medicare Other | Attending: Family Medicine | Admitting: Hematology

## 2022-10-19 ENCOUNTER — Other Ambulatory Visit: Payer: Self-pay

## 2022-10-19 VITALS — BP 166/85 | HR 74 | Temp 97.7°F | Ht 70.0 in | Wt 137.6 lb

## 2022-10-19 DIAGNOSIS — Z79810 Long term (current) use of selective estrogen receptor modulators (SERMs): Secondary | ICD-10-CM | POA: Diagnosis not present

## 2022-10-19 DIAGNOSIS — C773 Secondary and unspecified malignant neoplasm of axilla and upper limb lymph nodes: Secondary | ICD-10-CM | POA: Insufficient documentation

## 2022-10-19 DIAGNOSIS — C50411 Malignant neoplasm of upper-outer quadrant of right female breast: Secondary | ICD-10-CM | POA: Diagnosis not present

## 2022-10-19 DIAGNOSIS — R918 Other nonspecific abnormal finding of lung field: Secondary | ICD-10-CM

## 2022-10-19 DIAGNOSIS — Z170 Estrogen receptor positive status [ER+]: Secondary | ICD-10-CM | POA: Diagnosis not present

## 2022-10-19 NOTE — Progress Notes (Signed)
[place]   [phone] [phone]   Clinic Follow up Note   Patient Care Team: [name], MD as PCP - General (Family Medicine) Skeet Latch, MD as PCP - Cardiology (Cardiology) Rockwell Germany, RN as Oncology Nurse Navigator Mauro Kaufmann, RN as Oncology Nurse Navigator Jovita Kussmaul, MD as Consulting Physician (General Surgery) [name], MD as Consulting Physician (Hematology) Eppie Gibson, MD as Attending Physician (Radiation Oncology)  Date of Service:  10/19/2022  CHIEF COMPLAINT: f/u of  colon and right breast cancers     CURRENT THERAPY:  Tamoxifen '20mg'$  once daily since 03/2019.   ASSESSMENT:  Marissa Caldwell is a 89 y.o. female with   Multiple lung nodules -Patient has extensive history of smoking -She has multiple lung nodules/groundglass change on previous CT and PET scan since 07/2019, which has been stable until last scan in 10/2021 -I personally reviewed her CT chest without contrast from October 17, 2022, which showed enlarging right upper lobe nodule, now measuring 1.8cm, more solid appearing, highly suspicious for malignancy.  He also has multiple other small groundglass nodules, up to 1.3 cm, most are stable from previous scan. -I discussed the above findings with patient and her son in detail, and show them the image pictures. -I recommend a PET scan for further staging, and a pulmonary referral for bronchoscopy and biopsy -Patient initially was reluctant to do anything due to her advanced age and medical comorbidities, I discussed that this is potentially still curable if biopsy confirm lung cancer, such as SBRT. after lengthy discussion, patient agreed to proceed.  Malignant neoplasm of upper-outer quadrant of right breast in female, estrogen receptor positive ([place]) pT1cN2aMx, ER/PR+, HER2-, Grade II -s/p right breast lumpectomy by Dr Marlou Starks on 06/19/19, path showed 2 cm mucinous carcinoma and three positive  lymph nodes (two with ECE) plus one with micrometastasis. Margins negative. -she received adjuvant RT with Dr. Isidore Moos 08/05/19-09/03/19.  -She started Tamoxifen in 03/2019 and held 05/2019-07/2019 for surgery.  -most recent MM 04/05/21 was benign -From a breast cancer standpoint, she is stable.  -Continue Tamoxifen and surveillance with yearly mammograms   Cancer of right colon ([place])  pT2N0M0, stage I  -She was diagnosed in 09/2019 by colonoscopy with Dr Silverio Decamp. -She underwent right colectomy on 12/15/19 with Dr. Marlou Starks. 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 Negative margins and LNs.  -restaging PET 06/13/21 was NED  PLAN: -Discuss CT scan -Spot in the RT lung suspicious for cancer -Recommend PET scan  -Recommend seeing Pulmonologist, referral made  -I will call her son with PET result, and see her back if biopsy confirms malignancy.   SUMMARY OF ONCOLOGIC HISTORY: Oncology History Overview Note  Cancer Staging Cancer of right colon [facility]) Staging form: Colon and Rectum, AJCC 8th Edition - Pathologic stage from 12/15/2019: Stage I (pT2, pN0, cM0) - Signed by [name], MD on 01/08/2020  Malignant neoplasm of upper-outer quadrant of right breast in female, estrogen receptor positive ([place]) Staging form: Breast, AJCC 8th Edition - Clinical: Stage IIA (cT1c, cN1, cM0, G3, ER+, PR+, HER2-) - Signed by [name], MD on 04/09/2019 - Pathologic stage from 06/19/2019: Stage IB (pT1c, pN2a, cM0, G2, ER+, PR+, HER2-) - Signed by [name], MD on 07/03/2019    Malignant neoplasm of upper-outer quadrant of right breast in female, estrogen receptor positive ([place])  03/19/2019 Mammogram   Diagnostic Mammogram 03/19/19  IMPRESSION The 2 cm distortion in the  right breast 9:30 position middle depth 3cm from the nipple is indeterminate. [place] recommended.  There is also a 1.2cmx3.2cm enlarged right axillary LN highly suggestive of  malignancy.     03/31/2019 Initial Biopsy   Diagnosis 03/31/19  1. Breast, right, needle core biopsy, 9:30 o'clock, mid depth, 3cmfn - INVASIVE DUCTAL CARCINOMA. - LYMPHOVASCULAR INVASION IS IDENTIFIED. - SEE COMMENT. 2. Lymph node, needle/core biopsy, right axilla - INVASIVE DUCTAL CARCINOMA. - SEE COMMENT.   03/31/2019 Receptors her2   1. PROGNOSTIC INDICATORS Results: IMMUNOHISTOCHEMICAL AND MORPHOMETRIC ANALYSIS PERFORMED MANUALLY The tumor cells are NEGATIVE for Her2 (0). Estrogen Receptor: 100%, POSITIVE, STRONG STAINING INTENSITY Progesterone Receptor: 10%, POSITIVE, STRONG STAINING INTENSITY Proliferation Marker Ki67: 10%  2. PROGNOSTIC INDICATORS Results: IMMUNOHISTOCHEMICAL AND MORPHOMETRIC ANALYSIS PERFORMED MANUALLY Estrogen Receptor: 100%, POSITIVE, STRONG STAINING INTENSITY Progesterone Receptor: 0%, NEGATIVE Proliferation Marker Ki67: 40%   04/03/2019 Initial Diagnosis   Malignant neoplasm of upper-outer quadrant of right breast in female, estrogen receptor positive ([place])   04/09/2019 Cancer Staging   Staging form: Breast, AJCC 8th Edition - Clinical: Stage IIA (cT1c, cN1, cM0, G3, ER+, PR+, HER2-) - Signed by [name], MD on 04/09/2019  Staging form: Breast, AJCC 8th Edition - Clinical: Stage IIA (cT1c, cN1, cM0, G3, ER+, PR+, HER2-) - Signed by [name], MD on 04/09/2019   03/2019 -  Anti-estrogen oral therapy   Tamoxifen '20mg'$  daily starting 03/2019. Held 05/30/19 -07/2019 for surgery.   06/19/2019 Surgery   RIGHT BREAST LUMPECTOMY WITH RADIOACTIVE SEED AND SENTINEL LYMPH NODE BIOPSY by Dr Marlou Starks  06/19/19    06/19/2019 Pathology Results   FINAL MICROSCOPIC DIAGNOSIS: 06/19/19  A. BREAST, RIGHT, LUMPECTOMY:  -  Mucinous carcinoma, Nottingham grade 2  -  Margins uninvolved by carcinoma (0.2 cm; medial margin)  -  Lymphovascular space invasion present  -  Previous biopsy site changes present  -  See oncology table and comment below   B. LYMPH NODE, RIGHT  AXILLARY #1, SENTINEL, BIOPSY:  -  Metastatic carcinoma involving two lymph nodes (2/2)  -  Extracapsular extension present  -  See comment   C. BREAST, RIGHT MEDIAL MARGIN, EXCISION:  -  Residual invasive carcinoma  -  Usual ductal hyperplasia with calcifications  -  See comment   D. LYMPH NODE, RIGHT AXILLARY #2, SENTINEL, BIOPSY:  -  Metastatic carcinoma involving one lymph node (1/1)   E. LYMPH NODE, RIGHT AXILLARY #1 , BIOPSY:  -  Metastatic carcinoma involving one lymph node (1/1)   F. LYMPH NODE, RIGHT AXILLARY #2, BIOPSY:  -  No carcinoma identified in one lymph node (0/1)    06/19/2019 Cancer Staging   Staging form: Breast, AJCC 8th Edition - Pathologic stage from 06/19/2019: Stage IB (pT1c, pN2a, cM0, G2, ER+, PR+, HER2-) - Signed by [name], MD on 07/03/2019   07/22/2019 PET scan   IMPRESSION: 1. Low level hypermetabolism identified in the surgical beds of the right breast and right axilla. This is presumably related to the surgery. 2. Tiny right subpectoral lymph nodes are asymmetric in concerning for metastatic disease on CT imaging. No hypermetabolism on the PET study although size of these lymph nodes is below reliable resolution on PET imaging. 3. 10 mm short axis precarinal lymph node shows low level hypermetabolism. Metastatic involvement not excluded. 4. 11 mm ground-glass nodule in right upper lobe shows low level FDG accumulation. Well differentiated or low-grade neoplasm is a distinct concern. Close follow-up recommended. 5. Focal hypermetabolism in the right colon, potentially related  to the ileocecal valve but incompletely characterized. Adenoma or carcinoma could have this appearance. Correlation with colorectal cancer screening history recommended. 6. Focal hypermetabolism identified in the region of the left pubic bone and adjacent sigmoid colon. No underlying bony or colonic lesion evident. Metastatic involvement of the left pubic bone  not excluded. 7.  Aortic Atherosclerois (ICD10-170.0)     08/05/2019 - 09/03/2019 Radiation Therapy   Adjuvant RT with Dr Isidore Moos 08/05/19-09/03/19.    06/13/2021 PET scan   IMPRESSION: 1. Sub solid right upper lobe pulmonary nodule shows low level hypermetabolism. Neoplasm not excluded. 2. 11 mm ground-glass nodule superior segment left lower lobe shows very low level FDG accumulation. Findings may be infectious/inflammatory. As well differentiated in low-grade neoplasm can be poorly FDG avid, continued close attention recommended. 3. No evidence for hypermetabolic metastatic disease in the abdomen or pelvis. 4.  Aortic Atherosclerois (ICD10-170.0) 5.  Emphysema. [id number])   Cancer of right colon ([place])  10/15/2019 Procedure   Colonoscopy by Dr Silverio Decamp  IMPRESSION - Likely malignant tumor in the proximal ascending colon. Biopsied. Tattooed. - Severe diverticulosis in the sigmoid colon, in the descending colon, in the transverse colon and in the ascending colon. - Non-bleeding internal hemorrhoids.   10/15/2019 Initial Biopsy   Diagnosis Ascending Colon Polyp, mass - ADENOCARCINOMA. Microscopic Comment IHC for MMR will be reported separately. Results reported to Dr. Harl Bowie on 10/16/2019. Intradepartmental consultation (Dr. Vic Ripper).   12/15/2019 Initial Diagnosis   Cancer of right colon ([place])   12/15/2019 Cancer Staging   Staging form: Colon and Rectum, AJCC 8th Edition - Pathologic stage from 12/15/2019: Stage I (pT2, pN0, cM0) - Signed by [name], MD on 01/08/2020   12/15/2019 Surgery   LAPAROSCOPIC ASSISTED RIGHT COLECTOMY by Dr Marlou Starks and Dr Marcello Moores   12/15/2019 Pathology Results   FINAL MICROSCOPIC DIAGNOSIS:   A. COLON, TERMINAL ILEUM AND RIGHT, COLECTOMY:  - Invasive moderately differentiated adenocarcinoma, 4.6 cm, involving  ascending colon  - Carcinoma invades into deeper muscularis propria but not beyond  - Resection margins are negative for  carcinoma  - Negative for lymphovascular or perineural invasion  - Unremarkable appendix  - Nineteen lymph nodes, negative for carcinoma (0/19)  - See oncology table    12/15/2019 Genetic Testing   ADDENDUM:   Mismatch Repair Protein (IHC)  SUMMARY INTERPRETATION: ABNORMAL  There is loss of the major and minor MMR proteins MLH1 and PMS2. The  loss of expression may be secondary to promoter hyper-methylation, gene  mutation or other genetic event. BRAF mutation testing and/or MLH1  methylation testing is indicated. The presence of a BRAF mutation and/or  MLH1 hypermethylation is indicative of a sporadic-type tumor. The  absence of either BRAF mutation and/or presence of normal methylation  indicate the possible presence of a hereditary germline mutation (e.g.  Lynch syndrome) and referral to genetic counseling is warranted. It is  recommended that the loss of protein expression be correlated with  molecular based MSI testing.   IHC EXPRESSION RESULTS  TEST           RESULT  MLH1:          LOSS OF NUCLEAR EXPRESSION  MSH2:          Preserved nuclear expression  MSH6:          Preserved nuclear expression  PMS2:          LOSS OF NUCLEAR EXPRESSION    12/29/2019 Imaging   CT AP W contrast  IMPRESSION: 1.  Sigmoid diverticulosis without inflammation. Status post right colectomy. 2. Multiple small calcified uterine fibroids. 3. No acute abnormality seen in the abdomen or pelvis.   Aortic Atherosclerosis (ICD10-I70.0).     01/28/2020 Imaging   CT AP w contrast  IMPRESSION: 1. Moderate irregular wall thickening of the right colon with some areas of low attenuation. Findings could be due to cecal diverticulitis or other inflammatory or infectious colitis. I doubt that this represents recurrent colon cancer. 2. Status post partial right colectomy. No findings for locoregional adenopathy or metastatic disease. 3. Stable advanced atherosclerotic calcifications involving the aorta and  branch vessels. 4. Stable benign-appearing central hepatic cysts. 5. Stable calcified uterine fibroids. 6. Aortic atherosclerosis.   Aortic Atherosclerosis (ICD10-I70.0).   04/30/2020 Imaging   CT Chest  IMPRESSION: 1. Ground-glass nodule in the RIGHT upper lobe measuring 1.4 x 0.9 cm previously 1.4 x 0.9 cm. 2. Ground-glass nodule in the LEFT upper lobe measuring 11 x 12 mm, spiculated margins and mild fissural distortion, accompanying features of this nodule. Findings are concerning for for multifocal bronchogenic neoplasm given persistence. 3. Stable size of small nodes along the RIGHT paratracheal chain largest approximately 9 mm. 4. Stable hepatic hemangioma. 5. Dilation of the ascending aorta to 3.7 cm, similar to previous exams. 6. Three-vessel coronary artery disease. 7. Aortic atherosclerosis.   Aortic Atherosclerosis (ICD10-I70.0).     02/01/2021 Imaging   CT CAP  IMPRESSION: No evidence of recurrent or metastatic carcinoma within the abdomen or pelvis. No evidence of thoracic metastatic disease.   Stable hepatic hemangioma.   Stable small calcified uterine fibroids.   Colonic diverticulosis. No radiographic evidence of diverticulitis.   Sub-solid pulmonary nodule in anterior right upper lobe shows increased solid component, suspicious for primary lung adenocarcinoma. PET-CT is recommended for further evaluation.   Stable 11 mm ground-glass nodule in superior segment of left lower lobe. Recommend continued attention on follow-up imaging, and this could also be assessed by PET-CT.   Aortic Atherosclerosis (ICD10-I70.0) and Emphysema (ICD10-J43.9).     06/13/2021 PET scan   IMPRESSION: 1. Sub solid right upper lobe pulmonary nodule shows low level hypermetabolism. Neoplasm not excluded. 2. 11 mm ground-glass nodule superior segment left lower lobe shows very low level FDG accumulation. Findings may be infectious/inflammatory. As well differentiated in  low-grade neoplasm can be poorly FDG avid, continued close attention recommended. 3. No evidence for hypermetabolic metastatic disease in the abdomen or pelvis. 4.  Aortic Atherosclerois (ICD10-170.0) 5.  Emphysema. (ICD10-J43.9)   10/17/2022 Imaging    IMPRESSION: 1. Progressive enlargement and spiculation of the previously demonstrated right upper lobe nodule, highly suspicious for malignancy, likely primary bronchogenic carcinoma. 2. Additional ground-glass and part solid nodules in the lingula and superior segment of the left lower lobe are grossly stable from previous CT of 2022. Continued follow-up recommended. 3. No typical evidence of metastatic disease. 4. Stable postsurgical changes in the right breast and right axilla. 5. Aortic Atherosclerosis (ICD10-I70.0) and Emphysema (ICD10-J43.9).        INTERVAL HISTORY:  Marissa Caldwell is here for a follow up of   colon and right breast cancers   She was last seen by me on 09/15/2022 She presents to the clinic accompanied by son.Pt denies cough and SOB and discomfort. Pt son states she is not eating as good as she should.Pt does live alone.Pt state she is still smoking.       All other systems were reviewed with the patient and are negative.  MEDICAL HISTORY:  Past Medical History:  Diagnosis Date   Anxiety    Arthritis    Breast cancer ([place])    CAD (coronary artery disease)    a. 03/2017: 95% LCx stenosis --> PCI/DES placed   Cataract    Chest pain 03/27/2017   Chronic diastolic heart failure ([place]) 06/25/2017   Chronic lower back pain    Colon cancer ([place]) 11/2019   s/p colectomy   COPD (chronic obstructive pulmonary disease) ([place])    Coronary artery disease    Diverticulitis    Diverticulosis    GERD (gastroesophageal reflux disease)    Glaucoma    Heart failure, type unknown ([place]) 03/11/2017   History of radiation therapy 08/05/19- 09/03/19   Right Breast/ SCV 16 fractions of 2.66 Gy each for a total of 42.56  Gy. Right breast boost 4 fractions of 2 Gy each to total 8 Gy.    Hyperlipidemia 03/11/2017   Hypertension    Hypothyroidism    Macular degeneration    wet and dry per pt's son   Personal history of chemotherapy    Personal history of radiation therapy    Pneumonia X 1   "years and years ago" (03/26/2017)   Shortness of breath 03/11/2017   Status post dilation of esophageal narrowing     SURGICAL HISTORY: Past Surgical History:  Procedure Laterality Date   BILATERAL OOPHORECTOMY Bilateral    BREAST LUMPECTOMY Right 06/19/2019   BREAST LUMPECTOMY WITH RADIOACTIVE SEED AND SENTINEL LYMPH NODE BIOPSY Right 06/19/2019   Procedure: RIGHT BREAST LUMPECTOMY WITH RADIOACTIVE SEED AND SENTINEL LYMPH NODE BIOPSY;  Surgeon: Jovita Kussmaul, MD;  Location: [facility];  Service: General;  Laterality: Right;   CATARACT EXTRACTION     CATARACT EXTRACTION W/ INTRAOCULAR LENS  IMPLANT, BILATERAL Bilateral    COLOSTOMY     Greater than 10 yrs   CORONARY ANGIOPLASTY WITH STENT PLACEMENT  03/26/2017   LAPAROSCOPIC RIGHT COLECTOMY Right 12/15/2019   Procedure: LAPAROSCOPIC ASSISTED RIGHT COLECTOMY;  Surgeon: Jovita Kussmaul, MD;  Location: [facility];  Service: General;  Laterality: Right;   LEFT HEART CATH AND CORONARY ANGIOGRAPHY N/A 03/26/2017   Procedure: Left Heart Cath and Coronary Angiography;  Surgeon: Belva Crome, MD;  Location: [facility];  Service: Cardiovascular;  Laterality: N/A;   NERVE SURGERY     "lump on my sciatic nerve was removed years ago"   SHOULDER OPEN ROTATOR CUFF REPAIR Right    THYROIDECTOMY, PARTIAL     TONSILLECTOMY     UPPER GASTROINTESTINAL ENDOSCOPY     > ten yrs    I have reviewed the social history and family history with the patient and they are unchanged from previous note.  ALLERGIES:  is allergic to lipitor [atorvastatin].  MEDICATIONS:  Current Outpatient Medications  Medication Sig Dispense Refill   albuterol (PROVENTIL HFA;VENTOLIN HFA) 108 (90 Base) MCG/ACT  inhaler Inhale 2 puffs into the lungs every 6 (six) hours as needed (for shortness of breath/wheezing.).      aspirin EC 81 MG tablet Take 81 mg by mouth daily.     brimonidine (ALPHAGAN) 0.2 % ophthalmic solution Place 1 drop into the left eye 2 (two) times daily.      budesonide-formoterol (SYMBICORT) 160-4.5 MCG/ACT inhaler Inhale 2 puffs into the lungs 2 (two) times daily.     Calcium Carb-Cholecalciferol (CALCIUM + D3) 600-200 MG-UNIT TABS Take 1 tablet by mouth daily.     Cholecalciferol (VITAMIN D3) 2000 units capsule Take 4,000 Units by  mouth daily.     Coenzyme Q10 (COQ10) 100 MG CAPS Take 100 mg by mouth daily.     diclofenac (CATAFLAM) 50 MG tablet Take 1 tablet (50 mg total) by mouth daily after breakfast. 30 tablet 3   gabapentin (NEURONTIN) 100 MG capsule Take 1 capsule (100 mg total) by mouth at bedtime. 30 capsule 3   latanoprost (XALATAN) 0.005 % ophthalmic solution Place 1 drop into both eyes at bedtime.     levothyroxine (SYNTHROID, LEVOTHROID) 100 MCG tablet Take 100 mcg by mouth daily before breakfast.   0   LUTEIN PO Take 1 tablet by mouth daily.     mirtazapine (REMERON) 15 MG tablet Take 15 mg by mouth at bedtime.     Multiple Vitamin (MULTIVITAMIN) tablet Take 1 tablet by mouth daily.     nitroGLYCERIN (NITROSTAT) 0.4 MG SL tablet Place 1 tablet (0.4 mg total) under the tongue every 5 (five) minutes as needed for chest pain. 25 tablet 3   Omega-3 Fatty Acids (FISH OIL PO) Take 1 capsule by mouth daily.     pravastatin (PRAVACHOL) 40 MG tablet TAKE 1 TABLET BY MOUTH DAILY GENERIC EQUIVALENT FOR PRAVACHOL 90 tablet 0   tamoxifen (NOLVADEX) 20 MG tablet TAKE 1 TABLET BY MOUTH ONCE DAILY . APPOINTMENT REQUIRED FOR FUTURE REFILLS 90 tablet 3   TOVIAZ 4 MG TB24 tablet Take 4 mg by mouth daily.   1   traMADol (ULTRAM) 50 MG tablet Take 0.5-1 tablets (25-50 mg total) by mouth every 8 (eight) hours as needed. 30 tablet 0   Current Facility-Administered Medications  Medication  Dose Route Frequency Provider Last Rate Last Admin   aflibercept (EYLEA) SOLN 2 mg  2 mg Intravitreal  Bernarda Caffey, MD   2 mg at 04/16/18 1424   aflibercept (EYLEA) SOLN 2 mg  2 mg Intravitreal  Bernarda Caffey, MD   2 mg at 05/14/18 1358   aflibercept (EYLEA) SOLN 2 mg  2 mg Intravitreal  Bernarda Caffey, MD   2 mg at 06/25/18 1433   aflibercept (EYLEA) SOLN 2 mg  2 mg Intravitreal  Bernarda Caffey, MD   2 mg at 08/07/18 1557   aflibercept (EYLEA) SOLN 2 mg  2 mg Intravitreal  Bernarda Caffey, MD   2 mg at 10/11/18 1522   Bevacizumab (AVASTIN) SOLN 1.25 mg  1.25 mg Intravitreal  Bernarda Caffey, MD   1.25 mg at 01/07/18 1429   Bevacizumab (AVASTIN) SOLN 1.25 mg  1.25 mg Intravitreal  Bernarda Caffey, MD   1.25 mg at 02/19/18 1356   Bevacizumab (AVASTIN) SOLN 1.25 mg  1.25 mg Intravitreal  Bernarda Caffey, MD   1.25 mg at 03/19/18 1347    PHYSICAL EXAMINATION: ECOG PERFORMANCE STATUS: 1 - Symptomatic but completely ambulatory  Vitals:   10/19/22 1508  BP: (!) 166/85  Pulse: 74  Temp: 97.7 F (36.5 C)  SpO2: 98%   Wt Readings from Last 3 Encounters:  10/19/22 137 lb 9.6 oz (62.4 kg)  09/15/22 135 lb 6.4 oz (61.4 kg)  05/15/22 140 lb (63.5 kg)    GENERAL:alert, no distress and comfortable SKIN: skin color, texture, turgor are normal, no rashes or significant lesions Musculoskeletal:no cyanosis of digits and no clubbing  NEURO: alert & oriented x 3 with fluent speech, no focal motor/sensory deficits  LABORATORY DATA:  I have reviewed the data as listed    Latest Ref Rng & Units 09/15/2022    1:38 PM 01/09/2022   10:02 AM 10/20/2021  9:54 AM  CBC  WBC 4.0 - 10.5 K/uL 6.1  5.9  7.0   Hemoglobin 12.0 - 15.0 g/dL 12.7  13.4  13.1   Hematocrit 36.0 - 46.0 % 37.1  39.8  38.3   Platelets 150 - 400 K/uL 190  229  179         Latest Ref Rng & Units 09/15/2022    1:38 PM 01/09/2022   10:02 AM 10/20/2021    9:54 AM  CMP  Glucose 70 - 99 mg/dL 99  111  87   BUN 8 - 23 mg/dL '24  10  27    '$ Creatinine 0.44 - 1.00 mg/dL 0.85  0.70  0.85   Sodium 135 - 145 mmol/L 139  138  140   Potassium 3.5 - 5.1 mmol/L 4.3  3.8  4.5   Chloride 98 - 111 mmol/L 106  102  105   CO2 22 - 32 mmol/L '30  23  31   '$ Calcium 8.9 - 10.3 mg/dL 9.5  9.4  9.7   Total Protein 6.5 - 8.1 g/dL 6.8  7.3  7.2   Total Bilirubin 0.3 - 1.2 mg/dL 0.4  0.5  0.4   Alkaline Phos 38 - 126 U/L 42  44  47   AST 15 - 41 U/L '22  23  21   '$ ALT 0 - 44 U/L '14  13  11       '$ RADIOGRAPHIC STUDIES: I have personally reviewed the radiological images as listed and agreed with the findings in the report. No results found.    Orders Placed This Encounter  Procedures   NM PET Image Restag (PS) Skull Base To Thigh    Standing Status:   Future    Standing Expiration Date:   10/19/2023    Order Specific Question:   If indicated for the ordered procedure, I authorize the administration of a radiopharmaceutical per Radiology protocol    Answer:   Yes    Order Specific Question:   Preferred imaging location?    Answer:   [facility]    Order Specific Question:   Release to patient    Answer:   Immediate   Ambulatory referral to Pulmonology    Referral Priority:   Elective    Referral Type:   Consultation    Referral Reason:   Specialty Services Required    Requested Specialty:   Pulmonary Disease    Number of Visits Requested:   1   All questions were answered. The patient knows to call the clinic with any problems, questions or concerns. No barriers to learning was detected. The total time spent in the appointment was 30 minutes.     [name], MD 10/19/2022   Felicity Coyer, CMA, am acting as scribe for [name], MD.   I have reviewed the above documentation for accuracy and completeness, and I agree with the above.

## 2022-10-19 NOTE — Telephone Encounter (Signed)
Contacted patient to scheduled appointments. Patient is aware of appointments that are scheduled.   

## 2022-11-06 ENCOUNTER — Encounter (HOSPITAL_COMMUNITY)
Admission: RE | Admit: 2022-11-06 | Discharge: 2022-11-06 | Disposition: A | Discharge status: Home / Self Care | Payer: Medicare Other | Source: Ambulatory Visit | Attending: Hematology | Admitting: Hematology

## 2022-11-06 DIAGNOSIS — R911 Solitary pulmonary nodule: Secondary | ICD-10-CM | POA: Diagnosis not present

## 2022-11-06 DIAGNOSIS — R918 Other nonspecific abnormal finding of lung field: Secondary | ICD-10-CM | POA: Insufficient documentation

## 2022-11-06 LAB — GLUCOSE, CAPILLARY: Glucose-Capillary: 102 mg/dL — ABNORMAL HIGH (ref 70–99)

## 2022-11-06 MED ORDER — FLUDEOXYGLUCOSE F - 18 (FDG) INJECTION
6.8000 | Freq: Once | INTRAVENOUS | Status: AC
  Administered 2022-11-06: 6.77 via INTRAVENOUS

## 2022-11-07 NOTE — Assessment & Plan Note (Signed)
-  pT2N0M0, stage I  -She was diagnosed in 09/2019 by colonoscopy with Dr Silverio Decamp. -She underwent right colectomy on 12/15/19 with Dr. Marlou Starks. 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 Negative margins and LNs.  -restaging PET 06/13/21 was NED

## 2022-11-07 NOTE — Assessment & Plan Note (Signed)
[id number], ER/PR+, HER2-, Grade II -s/p right breast lumpectomy by Dr Toth on 06/19/19, path showed 2 cm mucinous carcinoma and three positive lymph nodes (two with ECE) plus one with micrometastasis. Margins negative. -she received adjuvant RT with Dr. Squire 08/05/19-09/03/19.  -She started Tamoxifen in 03/2019 and held 05/2019-07/2019 for surgery.  -most recent MM 04/05/21 was benign -From a breast cancer standpoint, she is stable.  -Continue Tamoxifen and surveillance with yearly mammograms 

## 2022-11-07 NOTE — Assessment & Plan Note (Signed)
-  Patient has extensive history of smoking -She has multiple lung nodules/groundglass change on previous CT and PET scan since 07/2019, which has been stable until last scan in 10/2021 -Her CT chest without contrast from October 17, 2022 showed enlarging right upper lobe nodule, now measuring 1.8cm, more solid appearing, highly suspicious for malignancy.  He also has multiple other small groundglass nodules, up to 1.3 cm, most are stable from previous scan. -I recommend a PET scan for further staging, and a pulmonary referral for bronchoscopy and biopsy

## 2022-11-08 ENCOUNTER — Encounter: Payer: Self-pay | Admitting: Hematology

## 2022-11-08 ENCOUNTER — Inpatient Hospital Stay: Payer: Medicare Other | Attending: Family Medicine | Admitting: Hematology

## 2022-11-08 DIAGNOSIS — C50411 Malignant neoplasm of upper-outer quadrant of right female breast: Secondary | ICD-10-CM | POA: Diagnosis not present

## 2022-11-08 DIAGNOSIS — Z170 Estrogen receptor positive status [ER+]: Secondary | ICD-10-CM | POA: Diagnosis not present

## 2022-11-08 DIAGNOSIS — R911 Solitary pulmonary nodule: Secondary | ICD-10-CM

## 2022-11-08 DIAGNOSIS — C182 Malignant neoplasm of ascending colon: Secondary | ICD-10-CM

## 2022-11-08 NOTE — Progress Notes (Signed)
[place]   [phone] [phone]   Clinic Follow up Note   Patient Care Team: [name], MD as PCP - General (Family Medicine) Skeet Latch, MD as PCP - Cardiology (Cardiology) Rockwell Germany, RN as Oncology Nurse Navigator Mauro Kaufmann, RN as Oncology Nurse Navigator Jovita Kussmaul, MD as Consulting Physician (General Surgery) [name], MD as Consulting Physician (Hematology) Eppie Gibson, MD as Attending Physician (Radiation Oncology)  Date of Service:  11/08/2022  I connected with Marissa Caldwell on 11/08/2022 at 12:20 PM EDT by telephone visit and verified that I am speaking with the correct person using two identifiers.  I discussed the limitations, risks, security and privacy concerns of performing an evaluation and management service by telephone and the availability of in person appointments. I also discussed with the patient that there may be a patient responsible charge related to this service. The patient expressed understanding and agreed to proceed.   Other persons participating in the visit and their role in the encounter:  Pt granddaughter and Son Patient's location:  home Provider's location:  office  CHIEF COMPLAINT: f/u of colon and right breast cancers     CURRENT THERAPY:  Tamoxifen 20mg  once daily since 03/2019.    ASSESSMENT & PLAN:  Marissa Caldwell is a 89 y.o. female with   Malignant neoplasm of upper-outer quadrant of right breast in female, estrogen receptor positive ([place]) pT1cN2aMx, ER/PR+, HER2-, Grade II -s/p right breast lumpectomy by Dr Marlou Starks on 06/19/19, path showed 2 cm mucinous carcinoma and three positive lymph nodes (two with ECE) plus one with micrometastasis. Margins negative. -she received adjuvant RT with Dr. Isidore Moos 08/05/19-09/03/19.  -She started Tamoxifen in 03/2019 and held 05/2019-07/2019 for surgery.  -most recent MM 04/05/21 was benign -From a breast cancer standpoint, she is  stable.  -Continue Tamoxifen and surveillance with yearly mammograms  Cancer of right colon ([place]) -pT2N0M0, stage I  -She was diagnosed in 09/2019 by colonoscopy with Dr Silverio Decamp. -She underwent right colectomy on 12/15/19 with Dr. Marlou Starks. 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 Negative margins and LNs.  -restaging PET 06/13/21 was NED  Lung nodule -Patient has extensive history of smoking -She has multiple lung nodules/groundglass change on previous CT and PET scan since 07/2019, which has been stable until last scan in 10/2021 -Her CT chest without contrast from October 17, 2022 showed enlarging right upper lobe nodule, now measuring 1.8cm, more solid appearing, highly suspicious for malignancy.  He also has multiple other small groundglass nodules, up to 1.3 cm, most are stable from previous scan. -I personally reviewed the PET scan images from November 06, 2022, which showed hypermetabolic 1.7 cm mass in the right upper lobe, with SUV 7.6, compatible with malignancy.  No other hypermetabolic lung lesion or adenopathy.  No evidence of distant metastasis. -I reviewed the results with patient's son and granddaughter, I discussed bronchoscopy and biopsy is usually recommended to get definitive diagnosis, and treatment option would be surgery or SBRT radiation.  Given her advanced age and comorbidities, she is not a good candidate for surgery.  I encouraged her to consider SBRT.  Given the typical finding on PET scan, if she does not want go through bronchoscopy and biopsy, I think it is reasonable to treat without a tissue diagnosis. 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  She would likely not take further workup  or treatment.  This is also very reasonable.  PLAN: -Discuss PET scan findings, which is highly suspicious for primary lung cancer in the right upper lobe, no  node or distant mets  -Recommend radiation SBRT, she will think about  -I recommend referral to [place], she will think about it  - Family will discuss and call us back with their decision    SUMMARY OF ONCOLOGIC HISTORY: Oncology History Overview Note  Cancer Staging Cancer of right colon [facility]) Staging form: Colon and Rectum, AJCC 8th Edition - Pathologic stage from 12/15/2019: Stage I (pT2, pN0, cM0) - Signed by [name], MD on 01/08/2020  Malignant neoplasm of upper-outer quadrant of right breast in female, estrogen receptor positive ([place]) Staging form: Breast, AJCC 8th Edition - Clinical: Stage IIA (cT1c, cN1, cM0, G3, ER+, PR+, HER2-) - Signed by [name], MD on 04/09/2019 - Pathologic stage from 06/19/2019: Stage IB (pT1c, pN2a, cM0, G2, ER+, PR+, HER2-) - Signed by [name], MD on 07/03/2019    Malignant neoplasm of upper-outer quadrant of right breast in female, estrogen receptor positive ([place])  03/19/2019 Mammogram   Diagnostic Mammogram 03/19/19  IMPRESSION The 2 cm distortion in the  right breast 9:30 position middle depth 3cm from the nipple is indeterminate. [place] recommended.  There is also a 1.2cmx3.2cm enlarged right axillary LN highly suggestive of malignancy.     03/31/2019 Initial Biopsy   Diagnosis 03/31/19  1. Breast, right, needle core biopsy, 9:30 o'clock, mid depth, 3cmfn - INVASIVE DUCTAL CARCINOMA. - LYMPHOVASCULAR INVASION IS IDENTIFIED. - SEE COMMENT. 2. Lymph node, needle/core biopsy, right axilla - INVASIVE DUCTAL CARCINOMA. - SEE COMMENT.   03/31/2019 Receptors her2   1. PROGNOSTIC INDICATORS Results: IMMUNOHISTOCHEMICAL AND MORPHOMETRIC ANALYSIS PERFORMED MANUALLY The tumor cells are NEGATIVE for Her2 (0). Estrogen Receptor: 100%, POSITIVE, STRONG STAINING INTENSITY Progesterone Receptor: 10%, POSITIVE, STRONG STAINING INTENSITY Proliferation Marker Ki67: 10%  2. PROGNOSTIC INDICATORS Results: IMMUNOHISTOCHEMICAL AND MORPHOMETRIC  ANALYSIS PERFORMED MANUALLY Estrogen Receptor: 100%, POSITIVE, STRONG STAINING INTENSITY Progesterone Receptor: 0%, NEGATIVE Proliferation Marker Ki67: 40%   04/03/2019 Initial Diagnosis   Malignant neoplasm of upper-outer quadrant of right breast in female, estrogen receptor positive ([place])   04/09/2019 Cancer Staging   Staging form: Breast, AJCC 8th Edition - Clinical: Stage IIA (cT1c, cN1, cM0, G3, ER+, PR+, HER2-) - Signed by [name], MD on 04/09/2019  Staging form: Breast, AJCC 8th Edition - Clinical: Stage IIA (cT1c, cN1, cM0, G3, ER+, PR+, HER2-) - Signed by [name], MD on 04/09/2019   03/2019 -  Anti-estrogen oral therapy   Tamoxifen 20mg  daily starting 03/2019. Held 05/30/19 -07/2019 for surgery.   06/19/2019 Surgery   RIGHT BREAST LUMPECTOMY WITH RADIOACTIVE SEED AND SENTINEL LYMPH NODE BIOPSY by Dr Marlou Starks  06/19/19    06/19/2019 Pathology Results   FINAL MICROSCOPIC DIAGNOSIS: 06/19/19  A. BREAST, RIGHT, LUMPECTOMY:  -  Mucinous carcinoma, Nottingham grade 2  -  Margins uninvolved by carcinoma (0.2 cm; medial margin)  -  Lymphovascular space invasion present  -  Previous biopsy site changes present  -  See oncology table and comment below   B. LYMPH NODE, RIGHT AXILLARY #1, SENTINEL, BIOPSY:  -  Metastatic carcinoma involving two lymph nodes (2/2)  -  Extracapsular extension present  -  See comment   C. BREAST, RIGHT MEDIAL MARGIN, EXCISION:  -  Residual invasive carcinoma  -  Usual ductal hyperplasia with calcifications  -  See comment   D. LYMPH NODE, RIGHT AXILLARY #  2, SENTINEL, BIOPSY:  -  Metastatic carcinoma involving one lymph node (1/1)   E. LYMPH NODE, RIGHT AXILLARY #1 , BIOPSY:  -  Metastatic carcinoma involving one lymph node (1/1)   F. LYMPH NODE, RIGHT AXILLARY #2, BIOPSY:  -  No carcinoma identified in one lymph node (0/1)    06/19/2019 Cancer Staging   Staging form: Breast, AJCC 8th Edition - Pathologic stage from 06/19/2019: Stage IB (pT1c,  pN2a, cM0, G2, ER+, PR+, HER2-) - Signed by [name], MD on 07/03/2019   07/22/2019 PET scan   IMPRESSION: 1. Low level hypermetabolism identified in the surgical beds of the right breast and right axilla. This is presumably related to the surgery. 2. Tiny right subpectoral lymph nodes are asymmetric in concerning for metastatic disease on CT imaging. No hypermetabolism on the PET study although size of these lymph nodes is below reliable resolution on PET imaging. 3. 10 mm short axis precarinal lymph node shows low level hypermetabolism. Metastatic involvement not excluded. 4. 11 mm ground-glass nodule in right upper lobe shows low level FDG accumulation. Well differentiated or low-grade neoplasm is a distinct concern. Close follow-up recommended. 5. Focal hypermetabolism in the right colon, potentially related to the ileocecal valve but incompletely characterized. Adenoma or carcinoma could have this appearance. Correlation with colorectal cancer screening history recommended. 6. Focal hypermetabolism identified in the region of the left pubic bone and adjacent sigmoid colon. No underlying bony or colonic lesion evident. Metastatic involvement of the left pubic bone not excluded. 7.  Aortic Atherosclerois (ICD10-170.0)     08/05/2019 - 09/03/2019 Radiation Therapy   Adjuvant RT with Dr Isidore Moos 08/05/19-09/03/19.    06/13/2021 PET scan   IMPRESSION: 1. Sub solid right upper lobe pulmonary nodule shows low level hypermetabolism. Neoplasm not excluded. 2. 11 mm ground-glass nodule superior segment left lower lobe shows very low level FDG accumulation. Findings may be infectious/inflammatory. As well differentiated in low-grade neoplasm can be poorly FDG avid, continued close attention recommended. 3. No evidence for hypermetabolic metastatic disease in the abdomen or pelvis. 4.  Aortic Atherosclerois (ICD10-170.0) 5.  Emphysema. [id number])   Cancer of right colon ([place])   10/15/2019 Procedure   Colonoscopy by Dr Silverio Decamp  IMPRESSION - Likely malignant tumor in the proximal ascending colon. Biopsied. Tattooed. - Severe diverticulosis in the sigmoid colon, in the descending colon, in the transverse colon and in the ascending colon. - Non-bleeding internal hemorrhoids.   10/15/2019 Initial Biopsy   Diagnosis Ascending Colon Polyp, mass - ADENOCARCINOMA. Microscopic Comment IHC for MMR will be reported separately. Results reported to Dr. Harl Bowie on 10/16/2019. Intradepartmental consultation (Dr. Vic Ripper).   12/15/2019 Initial Diagnosis   Cancer of right colon ([place])   12/15/2019 Cancer Staging   Staging form: Colon and Rectum, AJCC 8th Edition - Pathologic stage from 12/15/2019: Stage I (pT2, pN0, cM0) - Signed by [name], MD on 01/08/2020   12/15/2019 Surgery   LAPAROSCOPIC ASSISTED RIGHT COLECTOMY by Dr Marlou Starks and Dr Marcello Moores   12/15/2019 Pathology Results   FINAL MICROSCOPIC DIAGNOSIS:   A. COLON, TERMINAL ILEUM AND RIGHT, COLECTOMY:  - Invasive moderately differentiated adenocarcinoma, 4.6 cm, involving  ascending colon  - Carcinoma invades into deeper muscularis propria but not beyond  - Resection margins are negative for carcinoma  - Negative for lymphovascular or perineural invasion  - Unremarkable appendix  - Nineteen lymph nodes, negative for carcinoma (0/19)  - See oncology table    12/15/2019 Genetic Testing   ADDENDUM:   Mismatch  Repair Protein (IHC)  SUMMARY INTERPRETATION: ABNORMAL  There is loss of the major and minor MMR proteins MLH1 and PMS2. The  loss of expression may be secondary to promoter hyper-methylation, gene  mutation or other genetic event. BRAF mutation testing and/or MLH1  methylation testing is indicated. The presence of a BRAF mutation and/or  MLH1 hypermethylation is indicative of a sporadic-type tumor. The  absence of either BRAF mutation and/or presence of normal methylation  indicate the possible  presence of a hereditary germline mutation (e.g.  Lynch syndrome) and referral to genetic counseling is warranted. It is  recommended that the loss of protein expression be correlated with  molecular based MSI testing.   IHC EXPRESSION RESULTS  TEST           RESULT  MLH1:          LOSS OF NUCLEAR EXPRESSION  MSH2:          Preserved nuclear expression  MSH6:          Preserved nuclear expression  PMS2:          LOSS OF NUCLEAR EXPRESSION    12/29/2019 Imaging   CT AP W contrast  IMPRESSION: 1. Sigmoid diverticulosis without inflammation. Status post right colectomy. 2. Multiple small calcified uterine fibroids. 3. No acute abnormality seen in the abdomen or pelvis.   Aortic Atherosclerosis (ICD10-I70.0).     01/28/2020 Imaging   CT AP w contrast  IMPRESSION: 1. Moderate irregular wall thickening of the right colon with some areas of low attenuation. Findings could be due to cecal diverticulitis or other inflammatory or infectious colitis. I doubt that this represents recurrent colon cancer. 2. Status post partial right colectomy. No findings for locoregional adenopathy or metastatic disease. 3. Stable advanced atherosclerotic calcifications involving the aorta and branch vessels. 4. Stable benign-appearing central hepatic cysts. 5. Stable calcified uterine fibroids. 6. Aortic atherosclerosis.   Aortic Atherosclerosis (ICD10-I70.0).   04/30/2020 Imaging   CT Chest  IMPRESSION: 1. Ground-glass nodule in the RIGHT upper lobe measuring 1.4 x 0.9 cm previously 1.4 x 0.9 cm. 2. Ground-glass nodule in the LEFT upper lobe measuring 11 x 12 mm, spiculated margins and mild fissural distortion, accompanying features of this nodule. Findings are concerning for for multifocal bronchogenic neoplasm given persistence. 3. Stable size of small nodes along the RIGHT paratracheal chain largest approximately 9 mm. 4. Stable hepatic hemangioma. 5. Dilation of the ascending aorta to 3.7  cm, similar to previous exams. 6. Three-vessel coronary artery disease. 7. Aortic atherosclerosis.   Aortic Atherosclerosis (ICD10-I70.0).     02/01/2021 Imaging   CT CAP  IMPRESSION: No evidence of recurrent or metastatic carcinoma within the abdomen or pelvis. No evidence of thoracic metastatic disease.   Stable hepatic hemangioma.   Stable small calcified uterine fibroids.   Colonic diverticulosis. No radiographic evidence of diverticulitis.   Sub-solid pulmonary nodule in anterior right upper lobe shows increased solid component, suspicious for primary lung adenocarcinoma. PET-CT is recommended for further evaluation.   Stable 11 mm ground-glass nodule in superior segment of left lower lobe. Recommend continued attention on follow-up imaging, and this could also be assessed by PET-CT.   Aortic Atherosclerosis (ICD10-I70.0) and Emphysema (ICD10-J43.9).     06/13/2021 PET scan   IMPRESSION: 1. Sub solid right upper lobe pulmonary nodule shows low level hypermetabolism. Neoplasm not excluded. 2. 11 mm ground-glass nodule superior segment left lower lobe shows very low level FDG accumulation. Findings may be infectious/inflammatory. As well differentiated in low-grade  neoplasm can be poorly FDG avid, continued close attention recommended. 3. No evidence for hypermetabolic metastatic disease in the abdomen or pelvis. 4.  Aortic Atherosclerois (ICD10-170.0) 5.  Emphysema. (ICD10-J43.9)   10/17/2022 Imaging    IMPRESSION: 1. Progressive enlargement and spiculation of the previously demonstrated right upper lobe nodule, highly suspicious for malignancy, likely primary bronchogenic carcinoma. 2. Additional ground-glass and part solid nodules in the lingula and superior segment of the left lower lobe are grossly stable from previous CT of 2022. Continued follow-up recommended. 3. No typical evidence of metastatic disease. 4. Stable postsurgical changes in the right breast  and right axilla. 5. Aortic Atherosclerosis (ICD10-I70.0) and Emphysema (ICD10-J43.9).        INTERVAL HISTORY:  Marissa Caldwell was contacted for reviewing recent PET scan.  She was last seen by me on 10/19/2022.  Pt son is speaking for pt due to hearing impaired.  I also called her granddaughter and did 3 weeks because.  Patient denies any new symptoms since last visit.   All other systems were reviewed with the patient and are negative.  MEDICAL HISTORY:  Past Medical History:  Diagnosis Date   Anxiety    Arthritis    Breast cancer ([place])    CAD (coronary artery disease)    a. 03/2017: 95% LCx stenosis --> PCI/DES placed   Cataract    Chest pain 03/27/2017   Chronic diastolic heart failure ([place]) 06/25/2017   Chronic lower back pain    Colon cancer ([place]) 11/2019   s/p colectomy   COPD (chronic obstructive pulmonary disease) ([place])    Coronary artery disease    Diverticulitis    Diverticulosis    GERD (gastroesophageal reflux disease)    Glaucoma    Heart failure, type unknown ([place]) 03/11/2017   History of radiation therapy 08/05/19- 09/03/19   Right Breast/ SCV 16 fractions of 2.66 Gy each for a total of 42.56 Gy. Right breast boost 4 fractions of 2 Gy each to total 8 Gy.    Hyperlipidemia 03/11/2017   Hypertension    Hypothyroidism    Macular degeneration    wet and dry per pt's son   Personal history of chemotherapy    Personal history of radiation therapy    Pneumonia X 1   "years and years ago" (03/26/2017)   Shortness of breath 03/11/2017   Status post dilation of esophageal narrowing     SURGICAL HISTORY: Past Surgical History:  Procedure Laterality Date   BILATERAL OOPHORECTOMY Bilateral    BREAST LUMPECTOMY Right 06/19/2019   BREAST LUMPECTOMY WITH RADIOACTIVE SEED AND SENTINEL LYMPH NODE BIOPSY Right 06/19/2019   Procedure: RIGHT BREAST LUMPECTOMY WITH RADIOACTIVE SEED AND SENTINEL LYMPH NODE BIOPSY;  Surgeon: Jovita Kussmaul, MD;  Location: [facility];  Service:  General;  Laterality: Right;   CATARACT EXTRACTION     CATARACT EXTRACTION W/ INTRAOCULAR LENS  IMPLANT, BILATERAL Bilateral    COLOSTOMY     Greater than 10 yrs   CORONARY ANGIOPLASTY WITH STENT PLACEMENT  03/26/2017   LAPAROSCOPIC RIGHT COLECTOMY Right 12/15/2019   Procedure: LAPAROSCOPIC ASSISTED RIGHT COLECTOMY;  Surgeon: Jovita Kussmaul, MD;  Location: [facility];  Service: General;  Laterality: Right;   LEFT HEART CATH AND CORONARY ANGIOGRAPHY N/A 03/26/2017   Procedure: Left Heart Cath and Coronary Angiography;  Surgeon: Belva Crome, MD;  Location: [facility];  Service: Cardiovascular;  Laterality: N/A;   NERVE SURGERY     "lump on my sciatic nerve was removed  years ago"   SHOULDER OPEN ROTATOR CUFF REPAIR Right    THYROIDECTOMY, PARTIAL     TONSILLECTOMY     UPPER GASTROINTESTINAL ENDOSCOPY     > ten yrs    I have reviewed the social history and family history with the patient and they are unchanged from previous note.  ALLERGIES:  is allergic to lipitor [atorvastatin].  MEDICATIONS:  Current Outpatient Medications  Medication Sig Dispense Refill   albuterol (PROVENTIL HFA;VENTOLIN HFA) 108 (90 Base) MCG/ACT inhaler Inhale 2 puffs into the lungs every 6 (six) hours as needed (for shortness of breath/wheezing.).      aspirin EC 81 MG tablet Take 81 mg by mouth daily.     brimonidine (ALPHAGAN) 0.2 % ophthalmic solution Place 1 drop into the left eye 2 (two) times daily.      budesonide-formoterol (SYMBICORT) 160-4.5 MCG/ACT inhaler Inhale 2 puffs into the lungs 2 (two) times daily.     Calcium Carb-Cholecalciferol (CALCIUM + D3) 600-200 MG-UNIT TABS Take 1 tablet by mouth daily.     Cholecalciferol (VITAMIN D3) 2000 units capsule Take 4,000 Units by mouth daily.     Coenzyme Q10 (COQ10) 100 MG CAPS Take 100 mg by mouth daily.     diclofenac (CATAFLAM) 50 MG tablet Take 1 tablet (50 mg total) by mouth daily after breakfast. 30 tablet 3   gabapentin (NEURONTIN) 100 MG capsule  Take 1 capsule (100 mg total) by mouth at bedtime. 30 capsule 3   latanoprost (XALATAN) 0.005 % ophthalmic solution Place 1 drop into both eyes at bedtime.     levothyroxine (SYNTHROID, LEVOTHROID) 100 MCG tablet Take 100 mcg by mouth daily before breakfast.   0   LUTEIN PO Take 1 tablet by mouth daily.     mirtazapine (REMERON) 15 MG tablet Take 15 mg by mouth at bedtime.     Multiple Vitamin (MULTIVITAMIN) tablet Take 1 tablet by mouth daily.     nitroGLYCERIN (NITROSTAT) 0.4 MG SL tablet Place 1 tablet (0.4 mg total) under the tongue every 5 (five) minutes as needed for chest pain. 25 tablet 3   Omega-3 Fatty Acids (FISH OIL PO) Take 1 capsule by mouth daily.     pravastatin (PRAVACHOL) 40 MG tablet TAKE 1 TABLET BY MOUTH DAILY GENERIC EQUIVALENT FOR PRAVACHOL 90 tablet 0   tamoxifen (NOLVADEX) 20 MG tablet TAKE 1 TABLET BY MOUTH ONCE DAILY . APPOINTMENT REQUIRED FOR FUTURE REFILLS 90 tablet 3   TOVIAZ 4 MG TB24 tablet Take 4 mg by mouth daily.   1   traMADol (ULTRAM) 50 MG tablet Take 0.5-1 tablets (25-50 mg total) by mouth every 8 (eight) hours as needed. 30 tablet 0   Current Facility-Administered Medications  Medication Dose Route Frequency Provider Last Rate Last Admin   aflibercept (EYLEA) SOLN 2 mg  2 mg Intravitreal  Bernarda Caffey, MD   2 mg at 04/16/18 1424   aflibercept (EYLEA) SOLN 2 mg  2 mg Intravitreal  Bernarda Caffey, MD   2 mg at 05/14/18 1358   aflibercept (EYLEA) SOLN 2 mg  2 mg Intravitreal  Bernarda Caffey, MD   2 mg at 06/25/18 1433   aflibercept (EYLEA) SOLN 2 mg  2 mg Intravitreal  Bernarda Caffey, MD   2 mg at 08/07/18 1557   aflibercept (EYLEA) SOLN 2 mg  2 mg Intravitreal  Bernarda Caffey, MD   2 mg at 10/11/18 1522   Bevacizumab (AVASTIN) SOLN 1.25 mg  1.25 mg Intravitreal  Bernarda Caffey, MD  1.25 mg at 01/07/18 1429   Bevacizumab (AVASTIN) SOLN 1.25 mg  1.25 mg Intravitreal  Bernarda Caffey, MD   1.25 mg at 02/19/18 1356   Bevacizumab (AVASTIN) SOLN 1.25 mg  1.25 mg  Intravitreal  Bernarda Caffey, MD   1.25 mg at 03/19/18 1347    PHYSICAL EXAMINATION: ECOG PERFORMANCE STATUS: 2 - Symptomatic, <50% confined to bed  There were no vitals filed for this visit. Wt Readings from Last 3 Encounters:  10/19/22 137 lb 9.6 oz (62.4 kg)  09/15/22 135 lb 6.4 oz (61.4 kg)  05/15/22 140 lb (63.5 kg)     No vitals taken today, Exam not performed today  LABORATORY DATA:  I have reviewed the data as listed    Latest Ref Rng & Units 09/15/2022    1:38 PM 01/09/2022   10:02 AM 10/20/2021    9:54 AM  CBC  WBC 4.0 - 10.5 K/uL 6.1  5.9  7.0   Hemoglobin 12.0 - 15.0 g/dL 12.7  13.4  13.1   Hematocrit 36.0 - 46.0 % 37.1  39.8  38.3   Platelets 150 - 400 K/uL 190  229  179         Latest Ref Rng & Units 09/15/2022    1:38 PM 01/09/2022   10:02 AM 10/20/2021    9:54 AM  CMP  Glucose 70 - 99 mg/dL 99  111  87   BUN 8 - 23 mg/dL 24  10  27    Creatinine 0.44 - 1.00 mg/dL 0.85  0.70  0.85   Sodium 135 - 145 mmol/L 139  138  140   Potassium 3.5 - 5.1 mmol/L 4.3  3.8  4.5   Chloride 98 - 111 mmol/L 106  102  105   CO2 22 - 32 mmol/L 30  23  31    Calcium 8.9 - 10.3 mg/dL 9.5  9.4  9.7   Total Protein 6.5 - 8.1 g/dL 6.8  7.3  7.2   Total Bilirubin 0.3 - 1.2 mg/dL 0.4  0.5  0.4   Alkaline Phos 38 - 126 U/L 42  44  47   AST 15 - 41 U/L 22  23  21    ALT 0 - 44 U/L 14  13  11        RADIOGRAPHIC STUDIES: I have personally reviewed the radiological images as listed and agreed with the findings in the report. No results found.    No orders of the defined types were placed in this encounter.  All questions were answered. The patient knows to call the clinic with any problems, questions or concerns. No barriers to learning was detected. The total time spent in the appointment was 22 minutes.     [name], MD 11/08/2022   Felicity Coyer am acting as scribe for [name], MD.   I have reviewed the above documentation for accuracy and completeness, and I agree  with the above.

## 2022-11-14 ENCOUNTER — Telehealth: Payer: Self-pay

## 2022-11-14 NOTE — Telephone Encounter (Signed)
Son called in stating his mom is ready to start the radiation as per the discussion with Dr. Burr Medico last office visit. He also stated she doesn't want to do the biopsy just wants the radiation. I infomed him that Dr. Burr Medico is on [facility] and I will get this message to Cira Rue NP.

## 2022-11-21 NOTE — Progress Notes (Signed)
Thoracic Location of Tumor / Histology: right upper lobe nodule  NM PET Image Restag (PS) Skull Base To Thigh 11-06-22 --IMPRESSION: The 1.7 by 1.2 cm right upper lobe nodule is hypermetabolic with maximum SUV of 7.6, compatible with malignancy. This nodule has substantially increased in solidity over the last 4 years. The 0.7 by 0.7 cm ground-glass density pulmonary nodule in the superior segment left lower lobe has a maximum SUV of 1.1. This previously had a maximum SUV of 0.9. Possibilities include postinflammatory lesion versus low-grade indolent adenocarcinoma. Surveillance is likely warranted. No hypermetabolic adenopathy or findings of distant hypermetabolic metastatic disease. Aortic, coronary, and systemic atherosclerosis with mild cardiomegaly. Thoracolumbar scoliosis. Sigmoid colon diverticulosis.  CT Chest Wo Contrast  10-17-22 --IMPRESSION: Progressive enlargement and spiculation of the previously demonstrated right upper lobe nodule, highly suspicious for malignancy, likely primary bronchogenic carcinoma. Additional ground-glass and part solid nodules in the lingula and superior segment of the left lower lobe are grossly stable from previous CT of 2022. Continued follow-up recommended. No typical evidence of metastatic disease. Stable postsurgical changes in the right breast and right axilla. Aortic Atherosclerosis (ICD10-I70.0) and Emphysema (ICD10-J43.9).  Patient presented with symptoms of: abnormal PET and CT scans  Biopsies revealed:  Per Dr. Latanya Maudlin 11/08/22 note: "Given her advanced age and comorbidities, she is not a good candidate for surgery. I encouraged her to consider SBRT. Given the typical finding on PET scan, if she does not want go through bronchoscopy and biopsy, I think it is reasonable to treat without a tissue diagnosis."   Tobacco/Marijuana/Snuff/ETOH use: extensive history of smoking  Past/Anticipated interventions by cardiothoracic surgery, if any: (per  Dr. Mosetta Putt note on 11-08-22) none at this time due to advanced age and overall health condition-not a candidate for bronchoscopy   Past/Anticipated interventions by medical oncology, if any:  Dr. Mosetta Putt 11-08-22 --Lung nodule Patient has extensive history of smoking She has multiple lung nodules/groundglass change on previous CT and PET scan since 07/2019, which has been stable until last scan in 10/2021 Her CT chest without contrast from October 17, 2022 showed enlarging right upper lobe nodule, now measuring 1.8cm, more solid appearing, highly suspicious for malignancy.  He also has multiple other small groundglass nodules, up to 1.3 cm, most are stable from previous scan. I personally reviewed the PET scan images from November 06, 2022, which showed hypermetabolic 1.7 cm mass in the right upper lobe, with SUV 7.6, compatible with malignancy.  No other hypermetabolic lung lesion or adenopathy.  No evidence of distant metastasis. I reviewed the results with patient's son and granddaughter, I discussed bronchoscopy and biopsy is usually recommended to get definitive diagnosis, and treatment option would be surgery or SBRT radiation.  Given her advanced age and comorbidities, she is not a good candidate for surgery.  I encouraged her to consider SBRT.  Given the typical finding on PET scan, if she does not want go through bronchoscopy and biopsy, I think it is reasonable to treat without a tissue diagnosis. 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  She would likely not take further workup or treatment.  This is also very reasonable. --PLAN: Discuss PET scan findings, which is highly suspicious for primary lung cancer in the right upper lobe, no node or distant mets  Recommend radiation SBRT, she will think about  I recommend referral to Palliative Home Care, she will think about it  Family will discuss and call us back with  their decision    Signs/Symptoms Weight changes, if any:  Wt Readings from Last 3 Encounters:  12/05/22 132 lb 9.6 oz (60.1 kg)  10/19/22 137 lb 9.6 oz (62.4 kg)  09/15/22 135 lb 6.4 oz (61.4 kg)   Respiratory complaints, if any: Reports occasional SOB with activity, as well as an occasional productive cough (states phlegm is very thick and difficult to expel) Hemoptysis, if any: Denies Pain issues, if any:  Reports chronic lower pain to her lower back (manages with tramadol prescription). Also states she experienced a sharp/stabbing pain to her right shoulder   SAFETY ISSUES: Prior radiation? Yes 08/05/2019 through 09/03/2019 Site Technique Total Dose (Gy) Dose per Fx (Gy) Completed Fx Beam Energies  Breast, Right: Breast_Rt 3D 42.56/42.56 2.66 16/16 6X, 10X  Breast, Right: Breast_Rt_PAB_SCV 3D 42.56/42.56 2.66 16/16 6X, 10X  Breast, Right: Breast_Rt_Bst specialPort 8/8 2 4/4 9E, 12E   Pacemaker/ICD? No  Possible current pregnancy? No--postmenopausal Is the patient on methotrexate? No  Current Complaints / other details:

## 2022-12-05 ENCOUNTER — Ambulatory Visit
Admission: RE | Admit: 2022-12-05 | Discharge: 2022-12-05 | Disposition: A | Discharge status: Home / Self Care | Payer: Medicare Other | Source: Ambulatory Visit | Attending: Radiation Oncology | Admitting: Radiation Oncology

## 2022-12-05 ENCOUNTER — Encounter: Payer: Self-pay | Admitting: Radiation Oncology

## 2022-12-05 VITALS — BP 120/61 | HR 73 | Temp 97.5°F | Resp 18 | Wt 132.6 lb

## 2022-12-05 DIAGNOSIS — R918 Other nonspecific abnormal finding of lung field: Secondary | ICD-10-CM | POA: Diagnosis not present

## 2022-12-05 DIAGNOSIS — Z7982 Long term (current) use of aspirin: Secondary | ICD-10-CM | POA: Insufficient documentation

## 2022-12-05 DIAGNOSIS — E039 Hypothyroidism, unspecified: Secondary | ICD-10-CM | POA: Diagnosis not present

## 2022-12-05 DIAGNOSIS — J449 Chronic obstructive pulmonary disease, unspecified: Secondary | ICD-10-CM | POA: Diagnosis not present

## 2022-12-05 DIAGNOSIS — Z806 Family history of leukemia: Secondary | ICD-10-CM | POA: Insufficient documentation

## 2022-12-05 DIAGNOSIS — Z79899 Other long term (current) drug therapy: Secondary | ICD-10-CM | POA: Diagnosis not present

## 2022-12-05 DIAGNOSIS — Z79890 Hormone replacement therapy (postmenopausal): Secondary | ICD-10-CM | POA: Diagnosis not present

## 2022-12-05 DIAGNOSIS — C187 Malignant neoplasm of sigmoid colon: Secondary | ICD-10-CM | POA: Diagnosis not present

## 2022-12-05 DIAGNOSIS — I110 Hypertensive heart disease with heart failure: Secondary | ICD-10-CM | POA: Diagnosis not present

## 2022-12-05 DIAGNOSIS — C3411 Malignant neoplasm of upper lobe, right bronchus or lung: Secondary | ICD-10-CM | POA: Insufficient documentation

## 2022-12-05 DIAGNOSIS — C50411 Malignant neoplasm of upper-outer quadrant of right female breast: Secondary | ICD-10-CM | POA: Insufficient documentation

## 2022-12-05 DIAGNOSIS — Z87891 Personal history of nicotine dependence: Secondary | ICD-10-CM | POA: Diagnosis not present

## 2022-12-05 DIAGNOSIS — E785 Hyperlipidemia, unspecified: Secondary | ICD-10-CM | POA: Insufficient documentation

## 2022-12-05 DIAGNOSIS — Z170 Estrogen receptor positive status [ER+]: Secondary | ICD-10-CM | POA: Diagnosis not present

## 2022-12-05 DIAGNOSIS — Z923 Personal history of irradiation: Secondary | ICD-10-CM | POA: Diagnosis not present

## 2022-12-05 DIAGNOSIS — Z7951 Long term (current) use of inhaled steroids: Secondary | ICD-10-CM | POA: Insufficient documentation

## 2022-12-05 NOTE — Progress Notes (Signed)
Radiation Oncology         [phone] ________________________________  Outpatient Re-Consultation  Name: Marissa Caldwell MRN: [id number]  Date: 12/05/2022  DOB: 18-May-1932  CC:Laurann Montana, MD  Malachy Mood, MD   REFERRING PHYSICIAN: Malachy Mood, MD  DIAGNOSIS:    ICD-10-CM   1. Malignant neoplasm of right upper lobe of lung  C34.11      Enlarging right upper lobe pulmonary nodule concerning for malignancy    Cancer Staging  Cancer of right colon Staging form: Colon and Rectum, AJCC 8th Edition - Pathologic stage from 12/15/2019: Stage I (pT2, pN0, cM0) - Signed by Malachy Mood, MD on 01/08/2020 Total positive nodes: 0 Histologic grading system: 4 grade system Histologic grade (G): G2 Residual tumor (R): R0 - None  Malignant neoplasm of upper-outer quadrant of right breast in female, estrogen receptor positive Staging form: Breast, AJCC 8th Edition - Clinical: Stage IIA (cT1c, cN1, cM0, G3, ER+, PR+, HER2-) - Signed by Malachy Mood, MD on 04/09/2019 Histologic grading system: 3 grade system - Pathologic stage from 06/19/2019: Stage IB (pT1c, pN2a, cM0, G2, ER+, PR+, HER2-) - Signed by Malachy Mood, MD on 07/03/2019 Stage prefix: Initial diagnosis Multigene prognostic tests performed: None Histologic grading system: 3 grade system Residual tumor (R): R0 - None   CHIEF COMPLAINT: Here to discuss management of an enlarging right upper lobe nodule   HISTORY OF PRESENT ILLNESS::Marissa Caldwell is a 89 y.o. female who is known to me for her history of right breast cancer, s/p lumpectomy, radiation therapy completed in January 2021, and tamoxifen. She was lost to follow-up with radiation oncology after that time but has continued to follow with Dr. Mosetta Putt. She presents today for consideration of radiation therapy in management of her recently diagnosed enlarging right upper lobe pulmonary nodule.   To review, the patient had a PET scan performed on 07/22/2019 (for breast cancer staging  purposes) which incidentally showed an 11 mm ground-glass nodule in right upper lobe with low level FDG accumulation, raising possible concern for well differentiated or low-grade neoplasm.   She had a follow up chest CT performed on 04/30/2020 which showed stability of the RUL nodule, a suspicious ground-glass nodule in the LUL measuring 11 x 12 mm, and stable small nodes along the right paratracheal chain (the largest of which measuring approximately 9 mm).  The left and right upper lobe lung nodules remained consistently suspicious but relatively stable (vs a very slow progressive increase in size) in the following years. That is, until a chest chest CT on 10/17/22 demonstrated progressive enlargement and spiculation of the RUL nodule, measuring 1.8 x 1.3 cm, highly concerning for malignancy. Additional stable ground-glass nodules within the lingula were also appreciated, measuring 1.1 cm and approximately 1.3 x 0.7 cm (these were previously seen on imaging performed in 2022). CT otherwise showed stability of the previously demonstrated LUL nodule and no evidence of metastatic disease.   In light of CT findings, Dr. Mosetta Putt arranged for a restaging PET scan on 11/06/22 which demonstrated: the RUL as hypermetabolic (SUV max of 7.6) compatible with primary malignancy, and measuring 1.7 x 1.2 cm. PET otherwise showed stability of the LUL nodule which has low level hypermetabolism (SUV max of 1.1) and may represent a postinflammatory lesion vs low-grade indolent adenocarcinoma, and no hypermetabolic adenopathy or findings of distant hypermetabolic metastatic disease.  Given her advanced age and comorbidities, she is not a good surgical candidate for management of the RUL nodule. Dr. Mosetta Putt has also  informed the patient that it is reasonable to avoid bronchoscopy and biopsies and proceed with SBRT.   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 Given her advanced age and comorbidities,  Dr. Mosetta Putt has also recommended a referral to palliative home care which the patient and her family are still contemplating.   I personally reviewed her imaging.    Reports occasional SOB with activity, as well as an occasional productive non-bloody cough (states phlegm is very thick and difficult to expel).   PREVIOUS RADIATION THERAPY: Yes   Intent: Curative  Radiation Treatment Dates: 08/05/2019 through 09/03/2019 Site Technique Total Dose (Gy) Dose per Fx (Gy) Completed Fx Beam Energies  Breast, Right: Breast_Rt 3D 42.56/42.56 2.66 16/16 6X, 10X  Breast, Right: Breast_Rt_PAB_SCV 3D 42.56/42.56 2.66 16/16 6X, 10X  Breast, Right: Breast_Rt_Bst specialPort 8/8 2 4/4 9E, 12E    PAST MEDICAL HISTORY:  has a past medical history of Anxiety, Arthritis, Breast cancer, CAD (coronary artery disease), Cataract, Chest pain (03/27/2017), Chronic diastolic heart failure (06/25/2017), Chronic lower back pain, Colon cancer (11/2019), COPD (chronic obstructive pulmonary disease), Coronary artery disease, Diverticulitis, Diverticulosis, GERD (gastroesophageal reflux disease), Glaucoma, Heart failure, type unknown (03/11/2017), History of radiation therapy (08/05/19- 09/03/19), Hyperlipidemia (03/11/2017), Hypertension, Hypothyroidism, Macular degeneration, Personal history of chemotherapy, Personal history of radiation therapy, Pneumonia (X 1), Shortness of breath (03/11/2017), and Status post dilation of esophageal narrowing.    PAST SURGICAL HISTORY: Past Surgical History:  Procedure Laterality Date   BILATERAL OOPHORECTOMY Bilateral    BREAST LUMPECTOMY Right 06/19/2019   BREAST LUMPECTOMY WITH RADIOACTIVE SEED AND SENTINEL LYMPH NODE BIOPSY Right 06/19/2019   Procedure: RIGHT BREAST LUMPECTOMY WITH RADIOACTIVE SEED AND SENTINEL LYMPH NODE BIOPSY;  Surgeon: Griselda Miner, MD;  Location: [facility] OR;  Service: General;  Laterality: Right;   CATARACT EXTRACTION     CATARACT EXTRACTION W/ INTRAOCULAR LENS  IMPLANT,  BILATERAL Bilateral    COLOSTOMY     Greater than 10 yrs   CORONARY ANGIOPLASTY WITH STENT PLACEMENT  03/26/2017   LAPAROSCOPIC RIGHT COLECTOMY Right 12/15/2019   Procedure: LAPAROSCOPIC ASSISTED RIGHT COLECTOMY;  Surgeon: Griselda Miner, MD;  Location: [facility] OR;  Service: General;  Laterality: Right;   LEFT HEART CATH AND CORONARY ANGIOGRAPHY N/A 03/26/2017   Procedure: Left Heart Cath and Coronary Angiography;  Surgeon: Lyn Records, MD;  Location: [facility] INVASIVE CV LAB;  Service: Cardiovascular;  Laterality: N/A;   NERVE SURGERY     "lump on my sciatic nerve was removed years ago"   SHOULDER OPEN ROTATOR CUFF REPAIR Right    THYROIDECTOMY, PARTIAL     TONSILLECTOMY     UPPER GASTROINTESTINAL ENDOSCOPY     > ten yrs    FAMILY HISTORY: family history includes CAD in her mother; Cataracts in her mother; Coronary artery disease in her father; Leukemia in her sister.  SOCIAL HISTORY:  reports that she has quit smoking. Her smoking use included cigarettes. She has a 30.00 pack-year smoking history. She has never used smokeless tobacco. She reports that she does not currently use alcohol. She reports that she does not use drugs.  ALLERGIES: Lipitor [atorvastatin]  MEDICATIONS:  Current Outpatient Medications  Medication Sig Dispense Refill   famotidine (PEPCID) 20 MG tablet Take 20 mg by mouth daily.     LORazepam (ATIVAN) 0.5 MG tablet Take 0.25-0.5 mg by mouth every 8 (eight) hours as needed for anxiety.     albuterol (PROVENTIL HFA;VENTOLIN HFA) 108 (90 Base) MCG/ACT inhaler  Inhale 2 puffs into the lungs every 6 (six) hours as needed (for shortness of breath/wheezing.).      aspirin EC 81 MG tablet Take 81 mg by mouth daily.     brimonidine (ALPHAGAN) 0.2 % ophthalmic solution Place 1 drop into the left eye 2 (two) times daily.      budesonide-formoterol (SYMBICORT) 160-4.5 MCG/ACT inhaler Inhale 2 puffs into the lungs 2 (two) times daily.     Calcium Carb-Cholecalciferol (CALCIUM + D3)  600-200 MG-UNIT TABS Take 1 tablet by mouth daily.     Cholecalciferol (VITAMIN D3) 2000 units capsule Take 4,000 Units by mouth daily.     Coenzyme Q10 (COQ10) 100 MG CAPS Take 100 mg by mouth daily.     diclofenac (CATAFLAM) 50 MG tablet Take 1 tablet (50 mg total) by mouth daily after breakfast. 30 tablet 3   gabapentin (NEURONTIN) 100 MG capsule Take 1 capsule (100 mg total) by mouth at bedtime. 30 capsule 3   latanoprost (XALATAN) 0.005 % ophthalmic solution Place 1 drop into both eyes at bedtime.     levothyroxine (SYNTHROID, LEVOTHROID) 100 MCG tablet Take 100 mcg by mouth daily before breakfast.   0   LUTEIN PO Take 1 tablet by mouth daily.     mirtazapine (REMERON) 15 MG tablet Take 15 mg by mouth at bedtime.     Multiple Vitamin (MULTIVITAMIN) tablet Take 1 tablet by mouth daily.     nitroGLYCERIN (NITROSTAT) 0.4 MG SL tablet Place 1 tablet (0.4 mg total) under the tongue every 5 (five) minutes as needed for chest pain. 25 tablet 3   Omega-3 Fatty Acids (FISH OIL PO) Take 1 capsule by mouth daily.     pravastatin (PRAVACHOL) 40 MG tablet TAKE 1 TABLET BY MOUTH DAILY GENERIC EQUIVALENT FOR PRAVACHOL 90 tablet 0   tamoxifen (NOLVADEX) 20 MG tablet TAKE 1 TABLET BY MOUTH ONCE DAILY . APPOINTMENT REQUIRED FOR FUTURE REFILLS 90 tablet 3   TOVIAZ 4 MG TB24 tablet Take 4 mg by mouth daily.   1   traMADol (ULTRAM) 50 MG tablet Take 0.5-1 tablets (25-50 mg total) by mouth every 8 (eight) hours as needed. 30 tablet 0   Current Facility-Administered Medications  Medication Dose Route Frequency Provider Last Rate Last Admin   aflibercept (EYLEA) SOLN 2 mg  2 mg Intravitreal  Rennis Chris, MD   2 mg at 04/16/18 1424   aflibercept (EYLEA) SOLN 2 mg  2 mg Intravitreal  Rennis Chris, MD   2 mg at 05/14/18 1358   aflibercept (EYLEA) SOLN 2 mg  2 mg Intravitreal  Rennis Chris, MD   2 mg at 06/25/18 1433   aflibercept (EYLEA) SOLN 2 mg  2 mg Intravitreal  Rennis Chris, MD   2 mg at 08/07/18 1557    aflibercept (EYLEA) SOLN 2 mg  2 mg Intravitreal  Rennis Chris, MD   2 mg at 10/11/18 1522   Bevacizumab (AVASTIN) SOLN 1.25 mg  1.25 mg Intravitreal  Rennis Chris, MD   1.25 mg at 01/07/18 1429   Bevacizumab (AVASTIN) SOLN 1.25 mg  1.25 mg Intravitreal  Rennis Chris, MD   1.25 mg at 02/19/18 1356   Bevacizumab (AVASTIN) SOLN 1.25 mg  1.25 mg Intravitreal  Rennis Chris, MD   1.25 mg at 03/19/18 1347    REVIEW OF SYSTEMS:  Notable for that above.   PHYSICAL EXAM:  weight is 132 lb 9.6 oz (60.1 kg). Her temperature is 97.5 F (36.4 C) (abnormal). Her blood  pressure is 120/61 and her pulse is 73. Her respiration is 18 and oxygen saturation is 99%.   General: Alert and oriented, in no acute distress - hard of hearing HEENT: Head is normocephalic. Extraocular movements are intact.  Neck: Neck is supple, no palpable cervical or supraclavicular lymphadenopathy. Heart: Regular in rate and rhythm with no murmurs, rubs, or gallops. Chest: Clear to auscultation bilaterally, with no rhonchi, wheezes, or rales. Abdomen: Soft, nontender, nondistended, with no rigidity or guarding. Extremities: No cyanosis. 1+ edema in bilateral ankles.  Lymphatics: see Neck Exam Skin: No concerning lesions. Musculoskeletal: symmetric strength and muscle tone throughout. Neurologic: Cranial nerves II through XII are grossly intact. No obvious focalities. Speech is fluent. Coordination is intact. Psychiatric: Judgment and insight are intact. Affect is appropriate.   ECOG = 1  0 - Asymptomatic (Fully active, able to carry on all predisease activities without restriction)  1 - Symptomatic but completely ambulatory (Restricted in physically strenuous activity but ambulatory and able to carry out work of a light or sedentary nature. For example, light housework, office work)  2 - Symptomatic, <50% in bed during the day (Ambulatory and capable of all self care but unable to carry out any work activities. Up and about  more than 50% of waking hours)  3 - Symptomatic, >50% in bed, but not bedbound (Capable of only limited self-care, confined to bed or chair 50% or more of waking hours)  4 - Bedbound (Completely disabled. Cannot carry on any self-care. Totally confined to bed or chair)  5 - Death   Santiago Glad MM, Creech RH, Tormey DC, et al. [phone]). "Toxicity and response criteria of the [facility]". Am. Evlyn Clines. Oncol. 5 (6): 649-55   LABORATORY DATA:  Lab Results  Component Value Date   WBC 6.1 09/15/2022   HGB 12.7 09/15/2022   HCT 37.1 09/15/2022   MCV 94.4 09/15/2022   PLT 190 09/15/2022   CMP     Component Value Date/Time   NA 139 09/15/2022 1338   NA 140 02/19/2019 1115   K 4.3 09/15/2022 1338   CL 106 09/15/2022 1338   CO2 30 09/15/2022 1338   GLUCOSE 99 09/15/2022 1338   BUN 24 (H) 09/15/2022 1338   BUN 19 02/19/2019 1115   CREATININE 0.85 09/15/2022 1338   CALCIUM 9.5 09/15/2022 1338   PROT 6.8 09/15/2022 1338   PROT 6.8 02/19/2019 1115   ALBUMIN 3.7 09/15/2022 1338   ALBUMIN 4.0 02/19/2019 1115   AST 22 09/15/2022 1338   ALT 14 09/15/2022 1338   ALKPHOS 42 09/15/2022 1338   BILITOT 0.4 09/15/2022 1338   GFRNONAA >60 09/15/2022 1338   GFRAA >60 04/09/2020 1242         RADIOGRAPHY: NM PET Image Restag (PS) Skull Base To Thigh  Result Date: 11/08/2022 CLINICAL DATA:  Initial treatment strategy for enlarging right lung nodule. Also history colon cancer and right breast cancer. EXAM: NUCLEAR MEDICINE PET SKULL BASE TO THIGH TECHNIQUE: 6.8 mCi F-18 FDG was injected intravenously. Full-ring PET imaging was performed from the skull base to thigh after the radiotracer. CT data was obtained and used for attenuation correction and anatomic localization. Fasting blood glucose: 102 mg/dl COMPARISON:  Multiple exams, including 10/17/2022 and PET-CT 06/13/2021 FINDINGS: Mediastinal blood pool activity: SUV max 2.5 Liver activity: SUV max NA NECK: No significant abnormal  hypermetabolic activity in this region. Incidental CT findings: Bilateral common carotid atherosclerotic calcification. CHEST: The somewhat irregularly defined 1.7 by 1.2 cm right  upper lobe nodule on image 14 series 7 has maximum SUV of 7.6, compatible with malignancy. This nodule has substantially increased in solidity over the last 4 years. This previously had a maximum SUV of 2.5 on 06/13/2021. The 0.7 by 0.7 cm ground-glass density pulmonary nodule in the superior segment left lower lobe has a maximum SUV of 1.1. This previously had a maximum SUV of 0.9. Possibilities include postinflammatory lesion versus low-grade indolent adenocarcinoma. No definite hypermetabolic adenopathy. Incidental CT findings: Coronary, aortic arch, and branch vessel atherosclerotic vascular disease. Mild cardiomegaly. ABDOMEN/PELVIS: Mild physiologic activity in bowel. Incidental CT findings: Atherosclerosis is present, including aortoiliac atherosclerotic disease. Sigmoid colon diverticulosis. Calcified uterine fibroids. Postoperative findings in the right colon. SKELETON: No significant abnormal hypermetabolic activity in this region. Incidental CT findings: Dextroconvex thoracic and levoconvex lumbar scoliosis. IMPRESSION: 1. The 1.7 by 1.2 cm right upper lobe nodule is hypermetabolic with maximum SUV of 7.6, compatible with malignancy. This nodule has substantially increased in solidity over the last 4 years. 2. The 0.7 by 0.7 cm ground-glass density pulmonary nodule in the superior segment left lower lobe has a maximum SUV of 1.1. This previously had a maximum SUV of 0.9. Possibilities include postinflammatory lesion versus low-grade indolent adenocarcinoma. Surveillance is likely warranted. 3. No hypermetabolic adenopathy or findings of distant hypermetabolic metastatic disease. 4. Aortic, coronary, and systemic atherosclerosis with mild cardiomegaly. 5. Thoracolumbar scoliosis. 6. Sigmoid colon diverticulosis. Aortic  Atherosclerosis (ICD10-I70.0). Electronically Signed   By: Gaylyn Rong M.D.   On: 11/08/2022 08:42      IMPRESSION/PLAN: Enlarging right upper lobe pulmonary nodule concerning for malignancy   It was a pleasure seeing this patient again. Due to age and health status patient is not a candidate for biopsy. I agree that she would benefit from radiation therapy to the enlarging hypermetabolic pulmonary nodule in her right upper lobe as it is likely a progressive cancer based on serial scans..    We discussed the risks, benefits, short, and long term effects of radiotherapy, as well as the curative intent, and the patient is interested in proceeding. We discussed the delivery and logistics of radiotherapy and anticipates a course of 5 fractions of SBRT to the right upper lobe pulmonary nodule.  Side effects may include but not necessarily be limited to fatigue, injury to intrathoracic tissues, rib fracture; patient and family have a good understanding of the treatment plan and is enthusiastic about beginning treatment. All questions were answered. A consent form was signed today and placed in patient's chart. We will schedule CT simulation and plan to begin radiation treatment approximately one week after planning session.  On date of service, in total, I spent 40 minutes on this encounter. Patient was seen in person. __________________________________________   Joyice Faster, PA   Lonie Peak, MD  This document serves as a record of services personally performed by Lonie Peak, MD. It was created on her behalf by Neena Rhymes, a trained medical scribe. The creation of this record is based on the scribe's personal observations and the provider's statements to them. This document has been checked and approved by the attending provider.

## 2022-12-07 DIAGNOSIS — H401331 Pigmentary glaucoma, bilateral, mild stage: Secondary | ICD-10-CM | POA: Diagnosis not present

## 2022-12-11 ENCOUNTER — Ambulatory Visit
Admission: RE | Admit: 2022-12-11 | Discharge: 2022-12-11 | Disposition: A | Discharge status: Home / Self Care | Payer: Medicare Other | Source: Ambulatory Visit | Attending: Radiation Oncology | Admitting: Radiation Oncology

## 2022-12-11 DIAGNOSIS — R911 Solitary pulmonary nodule: Secondary | ICD-10-CM | POA: Diagnosis not present

## 2022-12-11 DIAGNOSIS — C3411 Malignant neoplasm of upper lobe, right bronchus or lung: Secondary | ICD-10-CM | POA: Diagnosis not present

## 2022-12-11 DIAGNOSIS — C50411 Malignant neoplasm of upper-outer quadrant of right female breast: Secondary | ICD-10-CM | POA: Insufficient documentation

## 2022-12-11 DIAGNOSIS — Z87891 Personal history of nicotine dependence: Secondary | ICD-10-CM | POA: Diagnosis not present

## 2022-12-15 DIAGNOSIS — R911 Solitary pulmonary nodule: Secondary | ICD-10-CM | POA: Diagnosis not present

## 2022-12-15 DIAGNOSIS — C50411 Malignant neoplasm of upper-outer quadrant of right female breast: Secondary | ICD-10-CM | POA: Diagnosis not present

## 2022-12-15 DIAGNOSIS — Z87891 Personal history of nicotine dependence: Secondary | ICD-10-CM | POA: Diagnosis not present

## 2022-12-15 DIAGNOSIS — C3411 Malignant neoplasm of upper lobe, right bronchus or lung: Secondary | ICD-10-CM | POA: Diagnosis not present

## 2022-12-20 ENCOUNTER — Ambulatory Visit: Payer: Medicare Other | Admitting: Radiation Oncology

## 2022-12-21 ENCOUNTER — Ambulatory Visit: Payer: Medicare Other | Admitting: Radiation Oncology

## 2022-12-22 ENCOUNTER — Ambulatory Visit: Payer: Medicare Other | Admitting: Radiation Oncology

## 2022-12-25 ENCOUNTER — Other Ambulatory Visit: Payer: Self-pay

## 2022-12-25 ENCOUNTER — Ambulatory Visit
Admission: RE | Admit: 2022-12-25 | Discharge: 2022-12-25 | Disposition: A | Discharge status: Home / Self Care | Payer: Medicare Other | Source: Ambulatory Visit | Attending: Radiation Oncology | Admitting: Radiation Oncology

## 2022-12-25 ENCOUNTER — Ambulatory Visit: Payer: Medicare Other

## 2022-12-25 DIAGNOSIS — R911 Solitary pulmonary nodule: Secondary | ICD-10-CM | POA: Insufficient documentation

## 2022-12-25 DIAGNOSIS — C3411 Malignant neoplasm of upper lobe, right bronchus or lung: Secondary | ICD-10-CM | POA: Diagnosis not present

## 2022-12-25 DIAGNOSIS — C50411 Malignant neoplasm of upper-outer quadrant of right female breast: Secondary | ICD-10-CM | POA: Insufficient documentation

## 2022-12-25 DIAGNOSIS — Z853 Personal history of malignant neoplasm of breast: Secondary | ICD-10-CM | POA: Insufficient documentation

## 2022-12-25 DIAGNOSIS — Z510 Encounter for antineoplastic radiation therapy: Secondary | ICD-10-CM | POA: Insufficient documentation

## 2022-12-25 LAB — RAD ONC ARIA SESSION SUMMARY
Course Elapsed Days: 0
Plan Fractions Treated to Date: 1
Plan Prescribed Dose Per Fraction: 12 Gy
Plan Total Fractions Prescribed: 5
Plan Total Prescribed Dose: 60 Gy
Reference Point Dosage Given to Date: 12 Gy
Reference Point Session Dosage Given: 12 Gy
Session Number: 1

## 2022-12-26 ENCOUNTER — Ambulatory Visit: Payer: Medicare Other | Admitting: Radiation Oncology

## 2022-12-27 ENCOUNTER — Ambulatory Visit
Admission: RE | Admit: 2022-12-27 | Discharge: 2022-12-27 | Disposition: A | Discharge status: Home / Self Care | Payer: Medicare Other | Source: Ambulatory Visit | Attending: Radiation Oncology | Admitting: Radiation Oncology

## 2022-12-27 ENCOUNTER — Other Ambulatory Visit: Payer: Self-pay

## 2022-12-27 DIAGNOSIS — C3411 Malignant neoplasm of upper lobe, right bronchus or lung: Secondary | ICD-10-CM | POA: Diagnosis not present

## 2022-12-27 DIAGNOSIS — Z853 Personal history of malignant neoplasm of breast: Secondary | ICD-10-CM | POA: Diagnosis not present

## 2022-12-27 DIAGNOSIS — Z510 Encounter for antineoplastic radiation therapy: Secondary | ICD-10-CM | POA: Diagnosis not present

## 2022-12-27 LAB — RAD ONC ARIA SESSION SUMMARY
Course Elapsed Days: 2
Plan Fractions Treated to Date: 2
Plan Prescribed Dose Per Fraction: 12 Gy
Plan Total Fractions Prescribed: 5
Plan Total Prescribed Dose: 60 Gy
Reference Point Dosage Given to Date: 24 Gy
Reference Point Session Dosage Given: 12 Gy
Session Number: 2

## 2022-12-29 ENCOUNTER — Other Ambulatory Visit: Payer: Self-pay

## 2022-12-29 ENCOUNTER — Ambulatory Visit
Admission: RE | Admit: 2022-12-29 | Discharge: 2022-12-29 | Disposition: A | Discharge status: Home / Self Care | Payer: Medicare Other | Source: Ambulatory Visit | Attending: Radiation Oncology | Admitting: Radiation Oncology

## 2022-12-29 DIAGNOSIS — Z853 Personal history of malignant neoplasm of breast: Secondary | ICD-10-CM | POA: Diagnosis not present

## 2022-12-29 DIAGNOSIS — C3411 Malignant neoplasm of upper lobe, right bronchus or lung: Secondary | ICD-10-CM | POA: Diagnosis not present

## 2022-12-29 DIAGNOSIS — Z510 Encounter for antineoplastic radiation therapy: Secondary | ICD-10-CM | POA: Diagnosis not present

## 2022-12-29 LAB — RAD ONC ARIA SESSION SUMMARY
Course Elapsed Days: 4
Plan Fractions Treated to Date: 3
Plan Prescribed Dose Per Fraction: 12 Gy
Plan Total Fractions Prescribed: 5
Plan Total Prescribed Dose: 60 Gy
Reference Point Dosage Given to Date: 36 Gy
Reference Point Session Dosage Given: 12 Gy
Session Number: 3

## 2023-01-02 ENCOUNTER — Other Ambulatory Visit: Payer: Self-pay

## 2023-01-02 ENCOUNTER — Ambulatory Visit
Admission: RE | Admit: 2023-01-02 | Discharge: 2023-01-02 | Disposition: A | Discharge status: Home / Self Care | Payer: Medicare Other | Source: Ambulatory Visit | Attending: Radiation Oncology | Admitting: Radiation Oncology

## 2023-01-02 DIAGNOSIS — Z510 Encounter for antineoplastic radiation therapy: Secondary | ICD-10-CM | POA: Diagnosis not present

## 2023-01-02 DIAGNOSIS — Z853 Personal history of malignant neoplasm of breast: Secondary | ICD-10-CM | POA: Diagnosis not present

## 2023-01-02 DIAGNOSIS — C3411 Malignant neoplasm of upper lobe, right bronchus or lung: Secondary | ICD-10-CM | POA: Diagnosis not present

## 2023-01-02 LAB — RAD ONC ARIA SESSION SUMMARY
Course Elapsed Days: 8
Plan Fractions Treated to Date: 4
Plan Prescribed Dose Per Fraction: 12 Gy
Plan Total Fractions Prescribed: 5
Plan Total Prescribed Dose: 60 Gy
Reference Point Dosage Given to Date: 48 Gy
Reference Point Session Dosage Given: 12 Gy
Session Number: 4

## 2023-01-04 ENCOUNTER — Other Ambulatory Visit: Payer: Self-pay

## 2023-01-04 ENCOUNTER — Ambulatory Visit
Admission: RE | Admit: 2023-01-04 | Discharge: 2023-01-04 | Disposition: A | Discharge status: Home / Self Care | Payer: Medicare Other | Source: Ambulatory Visit | Attending: Radiation Oncology | Admitting: Radiation Oncology

## 2023-01-04 DIAGNOSIS — Z853 Personal history of malignant neoplasm of breast: Secondary | ICD-10-CM | POA: Diagnosis not present

## 2023-01-04 DIAGNOSIS — Z510 Encounter for antineoplastic radiation therapy: Secondary | ICD-10-CM | POA: Diagnosis not present

## 2023-01-04 DIAGNOSIS — Z87891 Personal history of nicotine dependence: Secondary | ICD-10-CM | POA: Diagnosis not present

## 2023-01-04 DIAGNOSIS — C3411 Malignant neoplasm of upper lobe, right bronchus or lung: Secondary | ICD-10-CM | POA: Diagnosis not present

## 2023-01-04 LAB — RAD ONC ARIA SESSION SUMMARY
Course Elapsed Days: 10
Plan Fractions Treated to Date: 5
Plan Prescribed Dose Per Fraction: 12 Gy
Plan Total Fractions Prescribed: 5
Plan Total Prescribed Dose: 60 Gy
Reference Point Dosage Given to Date: 60 Gy
Reference Point Session Dosage Given: 12 Gy
Session Number: 5

## 2023-01-09 NOTE — Radiation Completion Notes (Signed)
Patient Name: Marissa Caldwell, Marissa Caldwell MRN: [id number] Date of Birth: 02/16/1932 Referring Physician: Malachy Mood, M.D. Date of Service: 2023-01-09 Radiation Oncologist: Lonie Peak, M.D. [facility] -                              RADIATION ONCOLOGY END OF TREATMENT NOTE     Diagnosis: C34.11 Malignant neoplasm of upper lobe, right bronchus or lung Staging on 2019-06-19: Malignant neoplasm of upper-outer quadrant of right breast in female, estrogen receptor positive (HCC) T=pT1c, N=pN2a, M=cM0 Staging on 2019-04-09: Malignant neoplasm of upper-outer quadrant of right breast in female, estrogen receptor positive (HCC) T=cT1c, N=cN1, M=cM0 Staging on 2019-12-15: Cancer of right colon (HCC) T=pT2, N=pN0, M=cM0 Intent: Curative     ==========DELIVERED PLANS==========  First Treatment Date: 2022-12-25 - Last Treatment Date: 2023-01-04   Plan Name: Lung_R_SBRT Site: Lung, Right Technique: SBRT/SRT-IMRT Mode: Photon Dose Per Fraction: 12 Gy Prescribed Dose (Delivered / Prescribed): 60 Gy / 60 Gy Prescribed Fxs (Delivered / Prescribed): 5 / 5     ==========ON TREATMENT VISIT DATES========== 2022-12-25, 2022-12-27, 2022-12-29, 2023-01-02, 2023-01-02, 2023-01-04     ==========UPCOMING VISITS==========       ==========APPENDIX - ON TREATMENT VISIT NOTES==========   See weekly On Treatment Notes is Epic for details.

## 2023-01-18 DIAGNOSIS — H401331 Pigmentary glaucoma, bilateral, mild stage: Secondary | ICD-10-CM | POA: Diagnosis not present

## 2023-02-02 DIAGNOSIS — M810 Age-related osteoporosis without current pathological fracture: Secondary | ICD-10-CM | POA: Diagnosis not present

## 2023-02-02 DIAGNOSIS — E039 Hypothyroidism, unspecified: Secondary | ICD-10-CM | POA: Diagnosis not present

## 2023-03-14 ENCOUNTER — Other Ambulatory Visit: Payer: Self-pay

## 2023-03-14 ENCOUNTER — Emergency Department (HOSPITAL_COMMUNITY): Payer: Medicare Other

## 2023-03-14 ENCOUNTER — Encounter (HOSPITAL_COMMUNITY): Payer: Self-pay

## 2023-03-14 ENCOUNTER — Observation Stay (HOSPITAL_COMMUNITY)
Admission: EM | Admit: 2023-03-14 | Discharge: 2023-03-15 | Disposition: A | Discharge status: Home / Self Care | Payer: Medicare Other | Attending: Family Medicine | Admitting: Family Medicine

## 2023-03-14 DIAGNOSIS — I110 Hypertensive heart disease with heart failure: Secondary | ICD-10-CM | POA: Diagnosis not present

## 2023-03-14 DIAGNOSIS — I5032 Chronic diastolic (congestive) heart failure: Secondary | ICD-10-CM | POA: Diagnosis not present

## 2023-03-14 DIAGNOSIS — Z7982 Long term (current) use of aspirin: Secondary | ICD-10-CM | POA: Insufficient documentation

## 2023-03-14 DIAGNOSIS — R079 Chest pain, unspecified: Secondary | ICD-10-CM | POA: Diagnosis not present

## 2023-03-14 DIAGNOSIS — I700 Atherosclerosis of aorta: Secondary | ICD-10-CM | POA: Diagnosis not present

## 2023-03-14 DIAGNOSIS — I160 Hypertensive urgency: Secondary | ICD-10-CM | POA: Diagnosis not present

## 2023-03-14 DIAGNOSIS — E039 Hypothyroidism, unspecified: Secondary | ICD-10-CM | POA: Insufficient documentation

## 2023-03-14 DIAGNOSIS — Z87891 Personal history of nicotine dependence: Secondary | ICD-10-CM

## 2023-03-14 DIAGNOSIS — G9389 Other specified disorders of brain: Secondary | ICD-10-CM | POA: Diagnosis not present

## 2023-03-14 DIAGNOSIS — Z85038 Personal history of other malignant neoplasm of large intestine: Secondary | ICD-10-CM | POA: Diagnosis not present

## 2023-03-14 DIAGNOSIS — I2510 Atherosclerotic heart disease of native coronary artery without angina pectoris: Secondary | ICD-10-CM | POA: Diagnosis present

## 2023-03-14 DIAGNOSIS — R911 Solitary pulmonary nodule: Secondary | ICD-10-CM | POA: Diagnosis present

## 2023-03-14 DIAGNOSIS — C50411 Malignant neoplasm of upper-outer quadrant of right female breast: Secondary | ICD-10-CM

## 2023-03-14 DIAGNOSIS — R072 Precordial pain: Secondary | ICD-10-CM | POA: Diagnosis not present

## 2023-03-14 DIAGNOSIS — R071 Chest pain on breathing: Secondary | ICD-10-CM | POA: Diagnosis not present

## 2023-03-14 DIAGNOSIS — J449 Chronic obstructive pulmonary disease, unspecified: Secondary | ICD-10-CM | POA: Diagnosis not present

## 2023-03-14 DIAGNOSIS — I10 Essential (primary) hypertension: Secondary | ICD-10-CM | POA: Diagnosis not present

## 2023-03-14 DIAGNOSIS — R0789 Other chest pain: Secondary | ICD-10-CM | POA: Diagnosis not present

## 2023-03-14 DIAGNOSIS — R1084 Generalized abdominal pain: Secondary | ICD-10-CM | POA: Diagnosis not present

## 2023-03-14 DIAGNOSIS — Z79899 Other long term (current) drug therapy: Secondary | ICD-10-CM | POA: Diagnosis not present

## 2023-03-14 DIAGNOSIS — Z853 Personal history of malignant neoplasm of breast: Secondary | ICD-10-CM

## 2023-03-14 DIAGNOSIS — J984 Other disorders of lung: Secondary | ICD-10-CM | POA: Diagnosis not present

## 2023-03-14 DIAGNOSIS — R0902 Hypoxemia: Secondary | ICD-10-CM | POA: Diagnosis not present

## 2023-03-14 DIAGNOSIS — R519 Headache, unspecified: Secondary | ICD-10-CM | POA: Diagnosis not present

## 2023-03-14 DIAGNOSIS — Z170 Estrogen receptor positive status [ER+]: Secondary | ICD-10-CM

## 2023-03-14 LAB — BASIC METABOLIC PANEL
Anion gap: 11 (ref 5–15)
BUN: 19 mg/dL (ref 8–23)
CO2: 25 mmol/L (ref 22–32)
Calcium: 9.1 mg/dL (ref 8.9–10.3)
Chloride: 100 mmol/L (ref 98–111)
Creatinine, Ser: 0.73 mg/dL (ref 0.44–1.00)
GFR, Estimated: >60 mL/min (ref >60)
Glucose, Bld: 96 mg/dL (ref 70–99)
Potassium: 4.5 mmol/L (ref 3.5–5.1)
Sodium: 136 mmol/L (ref 135–145)

## 2023-03-14 LAB — URINALYSIS, ROUTINE W REFLEX MICROSCOPIC
Bilirubin Urine: NEGATIVE
Glucose, UA: NEGATIVE mg/dL
Hgb urine dipstick: NEGATIVE
Ketones, ur: NEGATIVE mg/dL
Leukocytes,Ua: NEGATIVE
Nitrite: NEGATIVE
Protein, ur: NEGATIVE mg/dL
Specific Gravity, Urine: 1.009 (ref 1.005–1.030)
pH: 7 (ref 5.0–8.0)

## 2023-03-14 LAB — TROPONIN I (HIGH SENSITIVITY)
Troponin I (High Sensitivity): 7 ng/L (ref <18)
Troponin I (High Sensitivity): 3 ng/L (ref <18)

## 2023-03-14 LAB — CBC
HCT: 38.3 % (ref 36.0–46.0)
Hemoglobin: 12.7 g/dL (ref 12.0–15.0)
MCH: 32.5 pg (ref 26.0–34.0)
MCHC: 33.2 g/dL (ref 30.0–36.0)
MCV: 98 fL (ref 80.0–100.0)
Platelets: 187 10*3/uL (ref 150–400)
RBC: 3.91 MIL/uL (ref 3.87–5.11)
RDW: 14.1 % (ref 11.5–15.5)
WBC: 7.9 10*3/uL (ref 4.0–10.5)
nRBC: 0 % (ref 0.0–0.2)

## 2023-03-14 MED ORDER — SENNOSIDES-DOCUSATE SODIUM 8.6-50 MG PO TABS: 1.0000 | ORAL_TABLET | Freq: Every evening | ORAL | Status: DC | PRN

## 2023-03-14 MED ORDER — ENOXAPARIN SODIUM 40 MG/0.4ML IJ SOSY
40.0000 mg | PREFILLED_SYRINGE | INTRAMUSCULAR | Status: DC
  Administered 2023-03-14: 40 mg via SUBCUTANEOUS
  Filled 2023-03-14: qty 0.4

## 2023-03-14 MED ORDER — ASPIRIN 81 MG PO TBEC
81.0000 mg | DELAYED_RELEASE_TABLET | Freq: Every day | ORAL | Status: DC
  Administered 2023-03-15: 81 mg via ORAL
  Filled 2023-03-14: qty 1

## 2023-03-14 MED ORDER — BRIMONIDINE TARTRATE 0.2 % OP SOLN
1.0000 [drp] | Freq: Two times a day (BID) | OPHTHALMIC | Status: DC
  Filled 2023-03-14 (×2): qty 5

## 2023-03-14 MED ORDER — MORPHINE SULFATE (PF) 2 MG/ML IV SOLN
2.0000 mg | Freq: Once | INTRAVENOUS | Status: AC
  Administered 2023-03-14: 2 mg via INTRAVENOUS
  Filled 2023-03-14: qty 1

## 2023-03-14 MED ORDER — OXYCODONE-ACETAMINOPHEN 5-325 MG PO TABS
1.0000 | ORAL_TABLET | Freq: Four times a day (QID) | ORAL | Status: DC | PRN
  Administered 2023-03-14: 1 via ORAL
  Filled 2023-03-14: qty 1

## 2023-03-14 MED ORDER — METOCLOPRAMIDE HCL 5 MG/ML IJ SOLN
10.0000 mg | Freq: Once | INTRAMUSCULAR | Status: AC
  Administered 2023-03-14: 10 mg via INTRAVENOUS
  Filled 2023-03-14: qty 2

## 2023-03-14 MED ORDER — ACETAMINOPHEN 650 MG RE SUPP: 650.0000 mg | Freq: Four times a day (QID) | RECTAL | Status: DC | PRN

## 2023-03-14 MED ORDER — ACETAMINOPHEN 325 MG PO TABS
650.0000 mg | ORAL_TABLET | Freq: Four times a day (QID) | ORAL | Status: DC | PRN
  Administered 2023-03-15: 650 mg via ORAL
  Filled 2023-03-14: qty 2

## 2023-03-14 MED ORDER — ACETAMINOPHEN 325 MG PO TABS
650.0000 mg | ORAL_TABLET | Freq: Once | ORAL | Status: DC
  Filled 2023-03-14: qty 2

## 2023-03-14 MED ORDER — MOMETASONE FURO-FORMOTEROL FUM 200-5 MCG/ACT IN AERO
2.0000 | INHALATION_SPRAY | Freq: Two times a day (BID) | RESPIRATORY_TRACT | Status: DC
  Filled 2023-03-14: qty 8.8

## 2023-03-14 MED ORDER — OXYCODONE HCL 5 MG PO TABS: 5.0000 mg | ORAL_TABLET | ORAL | Status: DC | PRN

## 2023-03-14 MED ORDER — LATANOPROST 0.005 % OP SOLN
1.0000 [drp] | Freq: Every day | OPHTHALMIC | Status: DC
  Filled 2023-03-14: qty 2.5

## 2023-03-14 MED ORDER — ONDANSETRON HCL 4 MG/2ML IJ SOLN: 4.0000 mg | Freq: Four times a day (QID) | INTRAMUSCULAR | Status: DC | PRN

## 2023-03-14 MED ORDER — NITROGLYCERIN 0.4 MG SL SUBL: 0.4000 mg | SUBLINGUAL_TABLET | SUBLINGUAL | Status: DC | PRN

## 2023-03-14 MED ORDER — ONDANSETRON HCL 4 MG PO TABS: 4.0000 mg | ORAL_TABLET | Freq: Four times a day (QID) | ORAL | Status: DC | PRN

## 2023-03-14 MED ORDER — ALBUTEROL SULFATE (2.5 MG/3ML) 0.083% IN NEBU: 3.0000 mL | INHALATION_SOLUTION | Freq: Four times a day (QID) | RESPIRATORY_TRACT | Status: DC | PRN

## 2023-03-14 MED ORDER — LEVOTHYROXINE SODIUM 100 MCG PO TABS
100.0000 ug | ORAL_TABLET | Freq: Every day | ORAL | Status: DC
  Administered 2023-03-15: 100 ug via ORAL
  Filled 2023-03-14: qty 1

## 2023-03-14 MED ORDER — PRAVASTATIN SODIUM 40 MG PO TABS
40.0000 mg | ORAL_TABLET | Freq: Every day | ORAL | Status: DC
  Filled 2023-03-14: qty 1

## 2023-03-14 MED ORDER — IOHEXOL 350 MG/ML SOLN
75.0000 mL | Freq: Once | INTRAVENOUS | Status: AC | PRN
  Administered 2023-03-14: 75 mL via INTRAVENOUS

## 2023-03-14 MED ORDER — SODIUM CHLORIDE 0.9 % IV BOLUS
500.0000 mL | Freq: Once | INTRAVENOUS | Status: AC
  Administered 2023-03-14: 500 mL via INTRAVENOUS

## 2023-03-14 MED ORDER — SODIUM CHLORIDE 0.9% FLUSH
3.0000 mL | Freq: Two times a day (BID) | INTRAVENOUS | Status: DC
  Administered 2023-03-14 – 2023-03-15 (×2): 3 mL via INTRAVENOUS

## 2023-03-14 NOTE — ED Provider Notes (Signed)
[place] EMERGENCY DEPARTMENT AT [facility] Provider Note   CSN: [id number] Arrival date & time: 03/14/23  7846     History  Chief Complaint  Patient presents with   chest wall pain    Marissa Caldwell is a 89 y.o. female with past medical history significant for hyperlipidemia, hypothyroidism, CAD with previous stent placement, COPD, chronic diastolic heart failure, cancer of right colon as well as right upper lobe of lung with recent radiation within the last 6 weeks who presents with concern for chest pain over the last 2 to 3 days, increased pain when taking a deep breath.  She denies any fever, chills, nausea, vomiting but does have some decreased appetite at baseline secondary to her cancer.  On repeat examination patient also began to complain of headache which she describes as frontal, right-sided, that has been present since she has been in the emergency department.  She denies any numbness, tingling, vision changes associated with a headache.  HPI     Home Medications Prior to Admission medications   Medication Sig Start Date End Date Taking? Authorizing Provider  albuterol (PROVENTIL HFA;VENTOLIN HFA) 108 (90 Base) MCG/ACT inhaler Inhale 2 puffs into the lungs every 6 (six) hours as needed (for shortness of breath/wheezing.).     [provider]  aspirin EC 81 MG tablet Take 81 mg by mouth daily.    [provider]  brimonidine (ALPHAGAN) 0.2 % ophthalmic solution Place 1 drop into the left eye 2 (two) times daily.  05/06/19   [provider]  budesonide-formoterol (SYMBICORT) 160-4.5 MCG/ACT inhaler Inhale 2 puffs into the lungs 2 (two) times daily.    [provider]  Calcium Carb-Cholecalciferol (CALCIUM + D3) 600-200 MG-UNIT TABS Take 1 tablet by mouth daily.    [provider]  Cholecalciferol (VITAMIN D3) 2000 units capsule Take 4,000 Units by mouth daily.    [provider]  Coenzyme Q10 (COQ10) 100 MG  CAPS Take 100 mg by mouth daily.    [provider]  diclofenac (CATAFLAM) 50 MG tablet Take 1 tablet (50 mg total) by mouth daily after breakfast. 04/14/22   Kerrin Champagne, MD  famotidine (PEPCID) 20 MG tablet Take 20 mg by mouth daily. 10/19/22   [provider]  gabapentin (NEURONTIN) 100 MG capsule Take 1 capsule (100 mg total) by mouth at bedtime. 12/22/21   Kerrin Champagne, MD  latanoprost (XALATAN) 0.005 % ophthalmic solution Place 1 drop into both eyes at bedtime.    [provider]  levothyroxine (SYNTHROID, LEVOTHROID) 100 MCG tablet Take 100 mcg by mouth daily before breakfast.  05/17/18   [provider]  LORazepam (ATIVAN) 0.5 MG tablet Take 0.25-0.5 mg by mouth every 8 (eight) hours as needed for anxiety. 10/23/22   [provider]  LUTEIN PO Take 1 tablet by mouth daily.    [provider]  mirtazapine (REMERON) 15 MG tablet Take 15 mg by mouth at bedtime.    [provider]  Multiple Vitamin (MULTIVITAMIN) tablet Take 1 tablet by mouth daily.    [provider]  nitroGLYCERIN (NITROSTAT) 0.4 MG SL tablet Place 1 tablet (0.4 mg total) under the tongue every 5 (five) minutes as needed for chest pain. 01/01/20   Abelino Derrick, PA-C  Omega-3 Fatty Acids (FISH OIL PO) Take 1 capsule by mouth daily.    [provider]  pravastatin (PRAVACHOL) 40 MG tablet TAKE 1 TABLET BY MOUTH DAILY GENERIC EQUIVALENT FOR  PRAVACHOL 10/06/20   Chilton Si, MD  tamoxifen (NOLVADEX) 20 MG tablet TAKE 1 TABLET BY MOUTH ONCE DAILY . APPOINTMENT REQUIRED FOR FUTURE REFILLS 09/15/22   Malachy Mood, MD  TOVIAZ 4 MG TB24 tablet Take 4 mg by mouth daily.  05/20/18   [provider]  traMADol (ULTRAM) 50 MG tablet Take 0.5-1 tablets (25-50 mg total) by mouth every 8 (eight) hours as needed. 09/15/22   Malachy Mood, MD      Allergies    Lipitor [atorvastatin]    Review of Systems   Review of Systems  Respiratory:  Positive for  shortness of breath.   Cardiovascular:  Positive for chest pain.  Neurological:  Positive for headaches.  All other systems reviewed and are negative.   Physical Exam Updated Vital Signs BP (!) 197/74   Pulse 71   Temp 98 F (36.7 C) (Oral)   Resp 14   Ht 5\' 10"  (1.778 m)   Wt 61 kg   SpO2 96%   BMI 19.30 kg/m  Physical Exam Vitals and nursing note reviewed.  Constitutional:      General: She is not in acute distress.    Appearance: Normal appearance.  HENT:     Head: Normocephalic and atraumatic.  Eyes:     General:        Right eye: No discharge.        Left eye: No discharge.  Cardiovascular:     Rate and Rhythm: Normal rate and regular rhythm.     Heart sounds: No murmur heard.    No friction rub. No gallop.     Comments: Mild tenderness to palpation of the left-sided chest wall Pulmonary:     Effort: Pulmonary effort is normal.     Breath sounds: Normal breath sounds.  Abdominal:     General: Bowel sounds are normal.     Palpations: Abdomen is soft.  Skin:    General: Skin is warm and dry.     Capillary Refill: Capillary refill takes less than 2 seconds.  Neurological:     Mental Status: She is alert and oriented to person, place, and time.     Comments: Cranial nerves II through XII grossly intact.  Intact finger-nose, intact heel-to-shin.  Romberg negative, gait normal.  Alert and oriented x3.  Moves all 4 limbs spontaneously, normal coordination.  No pronator drift.  Intact strength 5 out of 5 bilateral upper and lower extremities.    Psychiatric:        Mood and Affect: Mood normal.        Behavior: Behavior normal.     ED Results / Procedures / Treatments   Labs (all labs ordered are listed, but only abnormal results are displayed) Labs Reviewed  URINALYSIS, ROUTINE W REFLEX MICROSCOPIC - Abnormal; Notable for the following components:      Result Value   Color, Urine STRAW (*)    All other components within normal limits  BASIC METABOLIC PANEL   CBC  TROPONIN I (HIGH SENSITIVITY)  TROPONIN I (HIGH SENSITIVITY)    EKG EKG Interpretation Date/Time:  Wednesday March 14 2023 08:17:00 EDT Ventricular Rate:  64 PR Interval:  190 QRS Duration:  97 QT Interval:  451 QTC Calculation: 466 R Axis:   78  Text Interpretation: Sinus rhythm Probable left ventricular hypertrophy Confirmed by Margarita Grizzle [phone]) on 03/14/2023 9:22:40 AM  Radiology CT Head Wo Contrast  Result Date: 03/14/2023 CLINICAL DATA:  Headache EXAM: CT HEAD WITHOUT CONTRAST TECHNIQUE:  Contiguous axial images were obtained from the base of the skull through the vertex without intravenous contrast. RADIATION DOSE REDUCTION: This exam was performed according to the departmental dose-optimization program which includes automated exposure control, adjustment of the mA and/or kV according to patient size and/or use of iterative reconstruction technique. COMPARISON:  Brain MRI 09/10/08 FINDINGS: Brain: No evidence of acute infarction, hemorrhage, hydrocephalus, extra-axial collection or mass lesion/mass effect. Vascular: No hyperdense vessel. Compared to prior MRI dated 09/10/2008 there is new presumably vascular calcification in the left frontal lobe (series 6, image 38). Skull: Normal. Negative for fracture or focal lesion. Sinuses/Orbits: No acute finding. Other: None. IMPRESSION: 1. No acute intracranial abnormality. 2. Compared to prior MRI dated 09/10/2008 there is new vascular calcification in the left frontal lobe. This is nonspecific and age indeterminate. If the patient has stroke-like symptoms, further evaluation with a brain MRI is recommended. Electronically Signed   By: Lorenza Cambridge M.D.   On: 03/14/2023 13:17   CT Angio Chest PE W and/or Wo Contrast  Result Date: 03/14/2023 CLINICAL DATA:  Chest wall pain. EXAM: CT ANGIOGRAPHY CHEST WITH CONTRAST TECHNIQUE: Multidetector CT imaging of the chest was performed using the standard protocol during bolus administration of  intravenous contrast. Multiplanar CT image reconstructions and MIPs were obtained to evaluate the vascular anatomy. RADIATION DOSE REDUCTION: This exam was performed according to the departmental dose-optimization program which includes automated exposure control, adjustment of the mA and/or kV according to patient size and/or use of iterative reconstruction technique. CONTRAST:  75mL OMNIPAQUE IOHEXOL 350 MG/ML SOLN COMPARISON:  October 17, 2022.  November 06, 2022. FINDINGS: Cardiovascular: Satisfactory opacification of the pulmonary arteries to the segmental level. No evidence of pulmonary embolism. Mild cardiomegaly is noted. Coronary artery calcifications are noted. No pericardial effusion. Mediastinum/Nodes: No enlarged mediastinal, hilar, or axillary lymph nodes. Thyroid gland, trachea, and esophagus demonstrate no significant findings. Lungs/Pleura: No pneumothorax or pleural effusion is noted. Minimal bilateral posterior basilar subsegmental atelectasis. 17 x 12 mm density is again noted in right upper lobe consistent with history of malignancy as noted on PET scan. Stable 8 x 6 mm irregular density seen in superior segment of left lower lobe of indeterminate significance based on prior PET scan. Upper Abdomen: No acute abnormality. Musculoskeletal: No chest wall abnormality. No acute or significant osseous findings. Review of the MIP images confirms the above findings. IMPRESSION: No definite evidence of pulmonary embolus. 17 x 12 mm density is noted in right upper lobe consistent with malignancy as noted in prior PET scan. Grossly stable 8 x 6 mm irregular density is noted in superior segment of left lower lobe which is of indeterminate significance based on prior PET scan. Coronary artery calcifications are noted. Aortic Atherosclerosis (ICD10-I70.0). Electronically Signed   By: Lupita Raider M.D.   On: 03/14/2023 10:53   DG Chest 2 View  Result Date: 03/14/2023 CLINICAL DATA:  Chest pain EXAM: CHEST  - 2 VIEW COMPARISON:  CT chest 10/17/2022 FINDINGS: Heart size is normal. Aortic atherosclerosis. No pleural fluid or interstitial edema. No airspace disease. Known right upper lobe lung lesion is of scared by overlying osseous structures. Thoracolumbar scoliosis. Surgical clips in the right axilla. IMPRESSION: No acute cardiopulmonary disease. Known right upper lobe lung lesion is obscured by overlying osseous structures. Electronically Signed   By: Signa Kell M.D.   On: 03/14/2023 08:47    Procedures Procedures    Medications Ordered in ED Medications  nitroGLYCERIN (NITROSTAT) SL tablet 0.4 mg (has  no administration in time range)  acetaminophen (TYLENOL) tablet 650 mg (650 mg Oral Patient Refused/Not Given 03/14/23 1248)  morphine (PF) 2 MG/ML injection 2 mg (2 mg Intravenous Given 03/14/23 0845)  iohexol (OMNIPAQUE) 350 MG/ML injection 75 mL (75 mLs Intravenous Contrast Given 03/14/23 1014)  metoCLOPramide (REGLAN) injection 10 mg (10 mg Intravenous Given 03/14/23 1247)  sodium chloride 0.9 % bolus 500 mL (0 mLs Intravenous Stopped 03/14/23 1425)  morphine (PF) 2 MG/ML injection 2 mg (2 mg Intravenous Given 03/14/23 1248)    ED Course/ Medical Decision Making/ A&P Clinical Course as of 03/14/23 1509  Wed Mar 14, 2023  1506 Dispo pending cardiology consult [SM]    Clinical Course User Index [SM] Dorthy Cooler, PA-C                             Medical Decision Making Amount and/or Complexity of Data Reviewed Labs: ordered. Radiology: ordered.  Risk OTC drugs. Prescription drug management.   This patient is a 89 y.o. female  who presents to the ED for concern of headache, chest pain, shob.   Differential diagnoses prior to evaluation: The emergent differential diagnosis includes, but is not limited to,  ACS, AAS, PE, Mallory-Weiss, Boerhaave's, Pneumonia, acute bronchitis, asthma or COPD exacerbation, anxiety, MSK pain or traumatic injury to the chest, acid reflux  versus other, Stroke, increased ICP, meningitis, CVA, intracranial tumor, venous sinus thrombosis, migraine, cluster headache, hypertension, drug related, head injury, tension headache, sinusitis, dental abscess, otitis media, TMJ . This is not an exhaustive differential.   Past Medical History / Co-morbidities / Social History: hyperlipidemia, hypothyroidism, CAD with previous stent placement, COPD, chronic diastolic heart failure, cancer of right colon as well as right upper lobe of lung with recent radiation within the last 6 weeks  Additional history: Chart reviewed. Pertinent results include: Reviewed lab work, imaging from previous emergency department visits and evaluation  Physical Exam: Physical exam performed. The pertinent findings include: Patient with no focal neurodeficits on my exam, she has some mild tenderness to palpation of the left-sided chest wall.  She is quite hard of hearing but otherwise in no acute distress, stable vital signs, she has been hypertensive in the emergency department, blood pressure 156/76.  Lab Tests/Imaging studies: I personally interpreted labs/imaging and the pertinent results include: CBC unremarkable, UA unremarkable, BMP unremarkable, negative troponin x 2..  Independently interpreted CT angio PE study, plain film chest x-ray shows stable lung masses related to malignancy, no evidence of acute PE or other abnormality at this time.  CT head without contrast shows no acute intracranial abnormality, some calcification in left frontal lob of indeterminate age, no acute focal neuro abnormalities at this time.  I agree with the radiologist interpretation.  Cardiac monitoring: EKG obtained and interpreted by myself and attending physician which shows: Normal sinus rhythm with LVH, no significant change from baseline  Medications: I ordered medication including morphine, Reglan, fluid bolus, nitroglycerin for chest pain, headache.   3:09 PM Care of Marissa  A Caldwell transferred to [facility] and Dr. Rubin Payor at the end of my shift as the patient will require reassessment once labs/imaging have resulted. Patient presentation, ED course, and plan of care discussed with review of all pertinent labs and imaging. Please see his/her note for further details regarding further ED course and disposition. Plan at time of handoff is pending cardiology consultation patient will likely benefit from admission for ongoing chest pain  with coronary artery disease at baseline. Cardiology will assess to clear if they think she should be discharged. This may be altered or completely changed at the discretion of the oncoming team pending results of further workup.  Final Clinical Impression(s) / ED Diagnoses Final diagnoses:  Chest pain, unspecified type    Rx / DC Orders ED Discharge Orders     None         [place] 03/14/23 1510    Margarita Grizzle, MD 03/15/23 1551

## 2023-03-14 NOTE — ED Triage Notes (Signed)
Pt BIBGEMS from home pt c/o cp over the past two to three days increasing worsens when taking a deep breath.  Right sided limb alert  Hx lung cancer and breast cancer  Tx radiation 6 weeks ago  90-95% RA 174/80 Hr 64  Cbg 111

## 2023-03-14 NOTE — ED Notes (Signed)
Patient transported to X-ray 

## 2023-03-14 NOTE — ED Provider Notes (Signed)
    Accepted handoff at shift change from [facility]. Please see prior provider note for more detail.   Briefly: Patient is 89 y.o. "concern for chest pain over the last 2 to 3 days, increased pain when taking a deep breath. She denies any fever, chills, nausea, vomiting but does have some decreased appetite at baseline secondary to her cancer. "  DDX: concern for ACS, AAS, PE, Mallory-Weiss, Boerhaave's, Pneumonia, acute bronchitis, asthma or COPD exacerbation, anxiety, MSK pain or traumatic injury to the chest, acid reflux versus other, Stroke, increased ICP, meningitis, CVA, intracranial tumor, venous sinus thrombosis, migraine, cluster headache, hypertension, drug related, head injury, tension headache, sinusitis, dental abscess, otitis media, TMJ   Plan:  - Admitting patient for further cardiology workup - PMHx hyperlipidemia, hypothyroidism, CAD with previous stent placement, COPD, chronic diastolic heart failure, cancer of right colon as well as right upper lobe of lung with recent radiation within the last 6 weeks - Presenting to ED concerned for chest pain x2-3 days. - Lab workup reassuring. Delta trop flat. CBC without anemia or leukocytosis. BMP normal. UA without infection. CT head without acute concern. CT chest without PE. Chest xray with no acute cardiopulmonary disease. EKG reassuring. Patient with high HEART score  - consulted with cardiology (Dr. Odis Hollingshead) who agreed to see patient for further cardiology workup. - Dr Antionette Char admitting provider       Dorthy Cooler, PA-C 03/14/23 2014    Margarita Grizzle, MD 03/15/23 [phone]

## 2023-03-14 NOTE — H&P (Signed)
History and Physical    Marissa Caldwell:[id number] DOB: [birth year] DOA: 03/14/2023  PCP: Laurann Montana, MD   Patient coming from: Home   Chief Complaint: Chest pain   HPI: Marissa Caldwell is a pleasant 89 y.o. female with medical history significant for breast cancer status postlumpectomy and radiation, stage I colon cancer status post colectomy, lung nodule undergoing SBRT, CAD, COPD, chronic diastolic CHF, hearing impairment, and blindness who presents for evaluation of chest pain.   Patient reports 2 to 3 days of pain involving her chest wall that is worse when taking a deep breath and improved with Tylenol.  She has not noticed any change in her pain with exertion.  She feels short of breath when her pain becomes severe but denies any change in her chronic cough and does not have any dyspnea now that her pain is resolved.  She denies any recent fever or chills.  ED Course: Upon arrival to the ED, patient is found to be afebrile and saturating well on room air normal heart rate and elevated blood pressure.  Chemistry panel, CBC, and troponin x 2 are normal.  CTA chest is negative for definite PE but notable for right upper lobe and left lower lobe densities.  Etiology was consulted by the ED PA and the patient was given 500 mL of normal saline, 2 doses of morphine, and Percocet in the ED.  Review of Systems:  All other systems reviewed and apart from HPI, are negative.  Past Medical History:  Diagnosis Date   Anxiety    Arthritis    Breast cancer (HCC)    CAD (coronary artery disease)    a. 03/2017: 95% LCx stenosis --> PCI/DES placed   Cataract    Chest pain 03/27/2017   Chronic diastolic heart failure (HCC) 06/25/2017   Chronic lower back pain    Colon cancer (HCC) 11/2019   s/p colectomy   COPD (chronic obstructive pulmonary disease) (HCC)    Coronary artery disease    Diverticulitis    Diverticulosis    GERD (gastroesophageal reflux disease)    Glaucoma    Heart  failure, type unknown (HCC) 03/11/2017   History of radiation therapy 08/05/19- 09/03/19   Right Breast/ SCV 16 fractions of 2.66 Gy each for a total of 42.56 Gy. Right breast boost 4 fractions of 2 Gy each to total 8 Gy.    Hyperlipidemia 03/11/2017   Hypertension    Hypothyroidism    Macular degeneration    wet and dry per pt's son   Personal history of chemotherapy    Personal history of radiation therapy    Pneumonia X 1   "years and years ago" (03/26/2017)   Shortness of breath 03/11/2017   Status post dilation of esophageal narrowing     Past Surgical History:  Procedure Laterality Date   BILATERAL OOPHORECTOMY Bilateral    BREAST LUMPECTOMY Right 06/19/2019   BREAST LUMPECTOMY WITH RADIOACTIVE SEED AND SENTINEL LYMPH NODE BIOPSY Right 06/19/2019   Procedure: RIGHT BREAST LUMPECTOMY WITH RADIOACTIVE SEED AND SENTINEL LYMPH NODE BIOPSY;  Surgeon: Griselda Miner, MD;  Location: [facility] OR;  Service: General;  Laterality: Right;   CATARACT EXTRACTION     CATARACT EXTRACTION W/ INTRAOCULAR LENS  IMPLANT, BILATERAL Bilateral    COLOSTOMY     Greater than 10 yrs   CORONARY ANGIOPLASTY WITH STENT PLACEMENT  03/26/2017   LAPAROSCOPIC RIGHT COLECTOMY Right 12/15/2019   Procedure: LAPAROSCOPIC ASSISTED RIGHT COLECTOMY;  Surgeon: Chevis Pretty III,  MD;  Location: [facility] OR;  Service: General;  Laterality: Right;   LEFT HEART CATH AND CORONARY ANGIOGRAPHY N/A 03/26/2017   Procedure: Left Heart Cath and Coronary Angiography;  Surgeon: Lyn Records, MD;  Location: [facility];  Service: Cardiovascular;  Laterality: N/A;   NERVE SURGERY     "lump on my sciatic nerve was removed years ago"   SHOULDER OPEN ROTATOR CUFF REPAIR Right    THYROIDECTOMY, PARTIAL     TONSILLECTOMY     UPPER GASTROINTESTINAL ENDOSCOPY     > ten yrs    Social History:   reports that she has quit smoking. Her smoking use included cigarettes. She has a 30 pack-year smoking history. She has never used smokeless tobacco. She  reports that she does not currently use alcohol. She reports that she does not use drugs.  Allergies  Allergen Reactions   Lipitor [Atorvastatin]     Elevated LFT's    Family History  Problem Relation Age of Onset   CAD Mother    Cataracts Mother    Coronary artery disease Father    Leukemia Sister    Amblyopia Neg Hx    Blindness Neg Hx    Diabetes Neg Hx    Glaucoma Neg Hx    Macular degeneration Neg Hx    Retinal detachment Neg Hx    Strabismus Neg Hx    Retinitis pigmentosa Neg Hx    Colon cancer Neg Hx    Colon polyps Neg Hx    Esophageal cancer Neg Hx    Rectal cancer Neg Hx    Stomach cancer Neg Hx      Prior to Admission medications   Medication Sig Start Date End Date Taking? Authorizing Provider  albuterol (PROVENTIL HFA;VENTOLIN HFA) 108 (90 Base) MCG/ACT inhaler Inhale 2 puffs into the lungs every 6 (six) hours as needed (for shortness of breath/wheezing.).     [provider]  aspirin EC 81 MG tablet Take 81 mg by mouth daily.    [provider]  brimonidine (ALPHAGAN) 0.2 % ophthalmic solution Place 1 drop into the left eye 2 (two) times daily.  05/06/19   [provider]  budesonide-formoterol (SYMBICORT) 160-4.5 MCG/ACT inhaler Inhale 2 puffs into the lungs 2 (two) times daily.    [provider]  Calcium Carb-Cholecalciferol (CALCIUM + D3) 600-200 MG-UNIT TABS Take 1 tablet by mouth daily.    [provider]  Cholecalciferol (VITAMIN D3) 2000 units capsule Take 4,000 Units by mouth daily.    [provider]  Coenzyme Q10 (COQ10) 100 MG CAPS Take 100 mg by mouth daily.    [provider]  diclofenac (CATAFLAM) 50 MG tablet Take 1 tablet (50 mg total) by mouth daily after breakfast. 04/14/22   Kerrin Champagne, MD  famotidine (PEPCID) 20 MG tablet Take 20 mg by mouth daily. 10/19/22   [provider]  gabapentin (NEURONTIN) 100 MG capsule Take 1 capsule (100 mg total) by mouth at bedtime. 12/22/21    Kerrin Champagne, MD  latanoprost (XALATAN) 0.005 % ophthalmic solution Place 1 drop into both eyes at bedtime.    [provider]  levothyroxine (SYNTHROID, LEVOTHROID) 100 MCG tablet Take 100 mcg by mouth daily before breakfast.  05/17/18   [provider]  LORazepam (ATIVAN) 0.5 MG tablet Take 0.25-0.5 mg by mouth every 8 (eight) hours as needed for anxiety. 10/23/22   [provider]  LUTEIN PO Take 1 tablet by mouth daily.  [provider]  mirtazapine (REMERON) 15 MG tablet Take 15 mg by mouth at bedtime.    [provider]  Multiple Vitamin (MULTIVITAMIN) tablet Take 1 tablet by mouth daily.    [provider]  nitroGLYCERIN (NITROSTAT) 0.4 MG SL tablet Place 1 tablet (0.4 mg total) under the tongue every 5 (five) minutes as needed for chest pain. 01/01/20   Abelino Derrick, PA-C  Omega-3 Fatty Acids (FISH OIL PO) Take 1 capsule by mouth daily.    [provider]  pravastatin (PRAVACHOL) 40 MG tablet TAKE 1 TABLET BY MOUTH DAILY GENERIC EQUIVALENT FOR PRAVACHOL 10/06/20   Chilton Si, MD  tamoxifen (NOLVADEX) 20 MG tablet TAKE 1 TABLET BY MOUTH ONCE DAILY . APPOINTMENT REQUIRED FOR FUTURE REFILLS 09/15/22   Malachy Mood, MD  TOVIAZ 4 MG TB24 tablet Take 4 mg by mouth daily.  05/20/18   [provider]  traMADol (ULTRAM) 50 MG tablet Take 0.5-1 tablets (25-50 mg total) by mouth every 8 (eight) hours as needed. 09/15/22   Malachy Mood, MD    Physical Exam: Vitals:   03/14/23 1400 03/14/23 1647 03/14/23 1930 03/14/23 2043  BP: (!) 197/74 (!) 157/86 132/61   Pulse: 71 73 67   Resp: 14 (!) 22 17   Temp:  98.8 F (37.1 C)  98.7 F (37.1 C)  TempSrc:  Oral  Oral  SpO2: 96% 99% 94%   Weight:      Height:        Constitutional: NAD, calm  Eyes: PERTLA, lids and conjunctivae normal ENMT: Mucous membranes are moist. Posterior pharynx clear of any exudate or lesions.   Neck: supple, no masses  Respiratory: no wheezing, no  crackles. No accessory muscle use.  Cardiovascular: S1 & S2 heard, regular rate and rhythm. No JVD. Abdomen: No distension, no tenderness, soft. Bowel sounds active.  Musculoskeletal: no clubbing / cyanosis. No joint deformity upper and lower extremities.   Skin: no significant rashes, lesions, ulcers. Warm, dry, well-perfused. Neurologic: Gross hearing and vision deficits, no facial asymmetry. Sensation to light touch intact. Moving all extremities. Alert and oriented.  Psychiatric: Pleasant. Cooperative.    Labs and Imaging on Admission: I have personally reviewed following labs and imaging studies  CBC: Recent Labs  Lab 03/14/23 0818  WBC 7.9  HGB 12.7  HCT 38.3  MCV 98.0  PLT 187   Basic Metabolic Panel: Recent Labs  Lab 03/14/23 0818  NA 136  K 4.5  CL 100  CO2 25  GLUCOSE 96  BUN 19  CREATININE 0.73  CALCIUM 9.1   GFR: Estimated Creatinine Clearance: 45.9 mL/min (by C-G formula based on SCr of 0.73 mg/dL). Liver Function Tests: No results for input(s): "AST", "ALT", "ALKPHOS", "BILITOT", "PROT", "ALBUMIN" in the last 168 hours. No results for input(s): "LIPASE", "AMYLASE" in the last 168 hours. No results for input(s): "AMMONIA" in the last 168 hours. Coagulation Profile: No results for input(s): "INR", "PROTIME" in the last 168 hours. Cardiac Enzymes: No results for input(s): "CKTOTAL", "CKMB", "CKMBINDEX", "TROPONINI" in the last 168 hours. BNP (last 3 results) No results for input(s): "PROBNP" in the last 8760 hours. HbA1C: No results for input(s): "HGBA1C" in the last 72 hours. CBG: No results for input(s): "GLUCAP" in the last 168 hours. Lipid Profile: No results for input(s): "CHOL", "HDL", "LDLCALC", "TRIG", "CHOLHDL", "LDLDIRECT" in the last 72 hours. Thyroid Function Tests: No results for input(s): "TSH", "T4TOTAL", "FREET4", "T3FREE", "THYROIDAB" in the last 72 hours. Anemia Panel: No results  for input(s): "VITAMINB12", "FOLATE", "FERRITIN",  "TIBC", "IRON", "RETICCTPCT" in the last 72 hours. Urine analysis:    Component Value Date/Time   COLORURINE STRAW (A) 03/14/2023 1020   APPEARANCEUR CLEAR 03/14/2023 1020   LABSPEC 1.009 03/14/2023 1020   PHURINE 7.0 03/14/2023 1020   GLUCOSEU NEGATIVE 03/14/2023 1020   HGBUR NEGATIVE 03/14/2023 1020   BILIRUBINUR NEGATIVE 03/14/2023 1020   KETONESUR NEGATIVE 03/14/2023 1020   PROTEINUR NEGATIVE 03/14/2023 1020   UROBILINOGEN 0.2 09/28/2017 1305   NITRITE NEGATIVE 03/14/2023 1020   LEUKOCYTESUR NEGATIVE 03/14/2023 1020   Sepsis Labs: @LABRCNTIP (procalcitonin:4,lacticidven:4) )No results found for this or any previous visit (from the past 240 hour(s)).   Radiological Exams on Admission: CT Head Wo Contrast  Result Date: 03/14/2023 CLINICAL DATA:  Headache EXAM: CT HEAD WITHOUT CONTRAST TECHNIQUE: Contiguous axial images were obtained from the base of the skull through the vertex without intravenous contrast. RADIATION DOSE REDUCTION: This exam was performed according to the departmental dose-optimization program which includes automated exposure control, adjustment of the mA and/or kV according to patient size and/or use of iterative reconstruction technique. COMPARISON:  Brain MRI 09/10/08 FINDINGS: Brain: No evidence of acute infarction, hemorrhage, hydrocephalus, extra-axial collection or mass lesion/mass effect. Vascular: No hyperdense vessel. Compared to prior MRI dated 09/10/2008 there is new presumably vascular calcification in the left frontal lobe (series 6, image 38). Skull: Normal. Negative for fracture or focal lesion. Sinuses/Orbits: No acute finding. Other: None. IMPRESSION: 1. No acute intracranial abnormality. 2. Compared to prior MRI dated 09/10/2008 there is new vascular calcification in the left frontal lobe. This is nonspecific and age indeterminate. If the patient has stroke-like symptoms, further evaluation with a brain MRI is recommended. Electronically Signed   By:  Lorenza Cambridge M.D.   On: 03/14/2023 13:17   CT Angio Chest PE W and/or Wo Contrast  Result Date: 03/14/2023 CLINICAL DATA:  Chest wall pain. EXAM: CT ANGIOGRAPHY CHEST WITH CONTRAST TECHNIQUE: Multidetector CT imaging of the chest was performed using the standard protocol during bolus administration of intravenous contrast. Multiplanar CT image reconstructions and MIPs were obtained to evaluate the vascular anatomy. RADIATION DOSE REDUCTION: This exam was performed according to the departmental dose-optimization program which includes automated exposure control, adjustment of the mA and/or kV according to patient size and/or use of iterative reconstruction technique. CONTRAST:  75mL OMNIPAQUE IOHEXOL 350 MG/ML SOLN COMPARISON:  October 17, 2022.  November 06, 2022. FINDINGS: Cardiovascular: Satisfactory opacification of the pulmonary arteries to the segmental level. No evidence of pulmonary embolism. Mild cardiomegaly is noted. Coronary artery calcifications are noted. No pericardial effusion. Mediastinum/Nodes: No enlarged mediastinal, hilar, or axillary lymph nodes. Thyroid gland, trachea, and esophagus demonstrate no significant findings. Lungs/Pleura: No pneumothorax or pleural effusion is noted. Minimal bilateral posterior basilar subsegmental atelectasis. 17 x 12 mm density is again noted in right upper lobe consistent with history of malignancy as noted on PET scan. Stable 8 x 6 mm irregular density seen in superior segment of left lower lobe of indeterminate significance based on prior PET scan. Upper Abdomen: No acute abnormality. Musculoskeletal: No chest wall abnormality. No acute or significant osseous findings. Review of the MIP images confirms the above findings. IMPRESSION: No definite evidence of pulmonary embolus. 17 x 12 mm density is noted in right upper lobe consistent with malignancy as noted in prior PET scan. Grossly stable 8 x 6 mm irregular density is noted in superior segment of left  lower lobe which is of indeterminate significance based on prior PET  scan. Coronary artery calcifications are noted. Aortic Atherosclerosis (ICD10-I70.0). Electronically Signed   By: Lupita Raider M.D.   On: 03/14/2023 10:53   DG Chest 2 View  Result Date: 03/14/2023 CLINICAL DATA:  Chest pain EXAM: CHEST - 2 VIEW COMPARISON:  CT chest 10/17/2022 FINDINGS: Heart size is normal. Aortic atherosclerosis. No pleural fluid or interstitial edema. No airspace disease. Known right upper lobe lung lesion is of scared by overlying osseous structures. Thoracolumbar scoliosis. Surgical clips in the right axilla. IMPRESSION: No acute cardiopulmonary disease. Known right upper lobe lung lesion is obscured by overlying osseous structures. Electronically Signed   By: Signa Kell M.D.   On: 03/14/2023 08:47    EKG: Independently reviewed. Sinus rhythm.   Assessment/Plan   1. Chest pain  - Pleuritic pain with normal troponin x2, non-ischemic EKG, and no definite PE on CTA  - Appreciate cardiology recommendations  - Follow-up ESR, CRP, BNP, and echo results    2. Hypertensive urgency  - SBP close to 200 in ED, normalized with analgesics  - Coreg was discontinued years ago d/t hypotension  - Continue to treat pain, resume Coreg if BP remains elevated despite pain-control   3. COPD  - Not in exacerbation on admission  - Continue ICS-LABA and as-needed albuterol   4. Pulmonary nodule  - Following with Dr. Mosetta Putt of oncology and undergoing SBRT - Appears stable on CT in ED    5. CAD  - Continue ASA, statin    6. Chronic diastolic CHF  - Appears compensated, BNP pending  - Monitor weight    DVT prophylaxis: Lovenox  Code Status: Full  Level of Care: Level of care: Telemetry Cardiac Family Communication: None available at time of admission   Disposition Plan:  Patient is from: Home   Anticipated d/c is to: TBD Anticipated d/c date is: 7/25 or 03/16/23  Patient currently: Pending echo and  additional lab workup Consults called: Cardiology  Admission status: Observation     Briscoe Deutscher, MD [facility]  03/14/2023, 9:06 PM

## 2023-03-14 NOTE — Consult Note (Addendum)
CARDIOLOGY CONSULT NOTE  Patient ID: Marissa Caldwell MRN: [id number] DOB/AGE: 05/16/1932 88 y.o.  Admit date: 03/14/2023 Attending physician: Briscoe Deutscher, MD Primary Physician:  Laurann Montana, MD Outpatient Cardiology Provider: NA Former cardiologist: Dr. Chilton Si. Inpatient Cardiologist: Tessa Lerner, DO, [facility]  Reason of consultation: Chest wall pain  Referring physician: Valrie Hart, PA-C  Chief complaint: Chest pain  HPI:  Marissa Caldwell is a 89 y.o. Caucasian female who presents with a chief complaint of " chest wall pain." Her past medical history and cardiovascular risk factors include: History of coronary artery disease status post PCI to the LCx, prior history of diastolic heart failure, hyperlipidemia, hypothyroidism, COPD, smoker, history of breast cancer, history of colon cancer, pulmonary nodule.  Patient is being followed by Dr. Chilton Si and has been lost to follow-up, last seen in the office in 2019.  Patient presents to the hospital with a chief complaint of chest wall pain has been ongoing for the last 2 or 3 days, intermittent, describes it as a cramp-like sensation, pleuritic in nature, states that it could be a pulled muscle, at times intensity is 6 out of 10, improves with Tylenol.  Not much noticeable with exertion and at times improves with resting.  Patient denies dyspnea on exertion but states that she does have shortness of breath if she forgets to use her inhalers.  Of note she has a prolonged history of smoking/COPD.  The discomfort she comes in today is not similar to when she had her PCI to the LCx.  Otherwise history is limited as she is hard of hearing.  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  History of right breast cancer treated with lumpectomy, radiation therapy, and tamoxifen.  History of colon cancer status post right colectomy.  Patient has had a history of longstanding smoking, imaging  studies have noted a lung nodule suspicious for malignancy, currently being followed by medical oncology.  ALLERGIES: Allergies  Allergen Reactions   Lipitor [Atorvastatin]     Elevated LFT's    PAST MEDICAL HISTORY: Past Medical History:  Diagnosis Date   Anxiety    Arthritis    Breast cancer (HCC)    CAD (coronary artery disease)    a. 03/2017: 95% LCx stenosis --> PCI/DES placed   Cataract    Chest pain 03/27/2017   Chronic diastolic heart failure (HCC) 06/25/2017   Chronic lower back pain    Colon cancer (HCC) 11/2019   s/p colectomy   COPD (chronic obstructive pulmonary disease) (HCC)    Coronary artery disease    Diverticulitis    Diverticulosis    GERD (gastroesophageal reflux disease)    Glaucoma    Heart failure, type unknown (HCC) 03/11/2017   History of radiation therapy 08/05/19- 09/03/19   Right Breast/ SCV 16 fractions of 2.66 Gy each for a total of 42.56 Gy. Right breast boost 4 fractions of 2 Gy each to total 8 Gy.    Hyperlipidemia 03/11/2017   Hypertension    Hypothyroidism    Macular degeneration    wet and dry per pt's son   Personal history of chemotherapy    Personal history of radiation therapy    Pneumonia X 1   "years and years ago" (03/26/2017)   Shortness of breath 03/11/2017   Status post dilation of esophageal narrowing     PAST SURGICAL HISTORY: Past Surgical History:  Procedure Laterality Date   BILATERAL OOPHORECTOMY Bilateral  BREAST LUMPECTOMY Right 06/19/2019   BREAST LUMPECTOMY WITH RADIOACTIVE SEED AND SENTINEL LYMPH NODE BIOPSY Right 06/19/2019   Procedure: RIGHT BREAST LUMPECTOMY WITH RADIOACTIVE SEED AND SENTINEL LYMPH NODE BIOPSY;  Surgeon: Griselda Miner, MD;  Location: [facility] OR;  Service: General;  Laterality: Right;   CATARACT EXTRACTION     CATARACT EXTRACTION W/ INTRAOCULAR LENS  IMPLANT, BILATERAL Bilateral    COLOSTOMY     Greater than 10 yrs   CORONARY ANGIOPLASTY WITH STENT PLACEMENT  03/26/2017   LAPAROSCOPIC RIGHT  COLECTOMY Right 12/15/2019   Procedure: LAPAROSCOPIC ASSISTED RIGHT COLECTOMY;  Surgeon: Griselda Miner, MD;  Location: [facility] OR;  Service: General;  Laterality: Right;   LEFT HEART CATH AND CORONARY ANGIOGRAPHY N/A 03/26/2017   Procedure: Left Heart Cath and Coronary Angiography;  Surgeon: Lyn Records, MD;  Location: [facility] INVASIVE CV LAB;  Service: Cardiovascular;  Laterality: N/A;   NERVE SURGERY     "lump on my sciatic nerve was removed years ago"   SHOULDER OPEN ROTATOR CUFF REPAIR Right    THYROIDECTOMY, PARTIAL     TONSILLECTOMY     UPPER GASTROINTESTINAL ENDOSCOPY     > ten yrs    FAMILY HISTORY: The patient's family history includes CAD in her mother; Cataracts in her mother; Coronary artery disease in her father; Leukemia in her sister.   SOCIAL HISTORY:  The patient  reports that she has quit smoking. Her smoking use included cigarettes. She has a 30 pack-year smoking history. She has never used smokeless tobacco. She reports that she does not currently use alcohol. She reports that she does not use drugs.  MEDICATIONS: Current Outpatient Medications  Medication Instructions   albuterol (PROVENTIL HFA;VENTOLIN HFA) 108 (90 Base) MCG/ACT inhaler 2 puffs, Inhalation, Every 6 hours PRN   aspirin EC 81 mg, Oral, Daily   brimonidine (ALPHAGAN) 0.2 % ophthalmic solution 1 drop, Left Eye, 2 times daily   budesonide-formoterol (SYMBICORT) 160-4.5 MCG/ACT inhaler 2 puffs, Inhalation, 2 times daily   Calcium Carb-Cholecalciferol (CALCIUM + D3) 600-200 MG-UNIT TABS 1 tablet, Oral, Daily   CoQ10 100 mg, Oral, Daily   diclofenac (CATAFLAM) 50 mg, Oral, Daily after breakfast   famotidine (PEPCID) 20 mg, Oral, Daily   gabapentin (NEURONTIN) 100 mg, Oral, Daily at bedtime   latanoprost (XALATAN) 0.005 % ophthalmic solution 1 drop, Both Eyes, Daily at bedtime   levothyroxine (SYNTHROID) 100 mcg, Oral, Daily before breakfast   LORazepam (ATIVAN) 0.25-0.5 mg, Oral, Every 8 hours PRN   LUTEIN PO 1  tablet, Oral, Daily   mirtazapine (REMERON) 15 mg, Oral, Daily at bedtime   Multiple Vitamin (MULTIVITAMIN) tablet 1 tablet, Oral, Daily   nitroGLYCERIN (NITROSTAT) 0.4 mg, Sublingual, Every 5 min PRN   Omega-3 Fatty Acids (FISH OIL PO) 1 capsule, Oral, Daily   pravastatin (PRAVACHOL) 40 MG tablet TAKE 1 TABLET BY MOUTH DAILY GENERIC EQUIVALENT FOR PRAVACHOL   tamoxifen (NOLVADEX) 20 MG tablet TAKE 1 TABLET BY MOUTH ONCE DAILY . APPOINTMENT REQUIRED FOR FUTURE REFILLS   Toviaz 4 mg, Oral, Daily   traMADol (ULTRAM) 25-50 mg, Oral, Every 8 hours PRN   Vitamin D3 4,000 Units, Oral, Daily    REVIEW OF SYSTEMS: Review of Systems  HENT:         Hard of hearing  Cardiovascular:  Positive for chest pain (left anterior chest wall pain). Negative for claudication, dyspnea on exertion, irregular heartbeat, leg swelling, near-syncope, orthopnea, palpitations, paroxysmal nocturnal dyspnea and syncope.  Respiratory:  Positive for shortness  of breath (better).   Hematologic/Lymphatic: Negative for bleeding problem.  Musculoskeletal:  Negative for muscle cramps and myalgias.  Neurological:  Negative for dizziness and light-headedness.    PHYSICAL EXAMINATION: PHYSICAL EXAM: Temp:  [98 F (36.7 C)-98.8 F (37.1 C)] 98.8 F (37.1 C) (07/24 1647) Pulse Rate:  [64-73] 67 (07/24 1930) Resp:  [13-22] 17 (07/24 1930) BP: (132-197)/(59-93) 132/61 (07/24 1930) SpO2:  [94 %-99 %] 94 % (07/24 1930) Weight:  [61 kg] 61 kg (07/24 0815)  Intake/Output:  Intake/Output Summary (Last 24 hours) at 03/14/2023 2013 Last data filed at 03/14/2023 1425 Gross per 24 hour  Intake 500 ml  Output --  Net 500 ml     Net IO Since Admission: 500 mL [03/14/23 2013]  Weights:     03/14/2023    8:15 AM 12/05/2022   12:34 PM 10/19/2022    3:08 PM  Last 3 Weights  Weight (lbs) 134 lb 7.7 oz 132 lb 9.6 oz 137 lb 9.6 oz  Weight (kg) 61 kg 60.147 kg 62.415 kg     Physical Exam  Constitutional: She appears cachectic.  No distress. She appears chronically ill.  hemodynamically stable, temporal wasting  Neck: No JVD present.  Cardiovascular: Normal rate, regular rhythm, S1 normal, S2 normal, intact distal pulses and normal pulses. Exam reveals no gallop, no S3 and no S4.  Murmur heard. Holosystolic murmur is present with a grade of 3/6 at the apex. Pulmonary/Chest: No stridor. She has no wheezes. She has no rales. Tenderness is present which mimics chest pain.  Decreased breath sounds bilaterally.  Abdominal: Soft. Bowel sounds are normal. She exhibits no distension. There is no abdominal tenderness.  Musculoskeletal:        General: No edema.     Cervical back: Neck supple.  Neurological: She is alert and oriented to person, place, and time. She has intact cranial nerves (2-12).  Skin: Skin is warm and moist.  Ecchymosis bilateral legs.    LAB RESULTS: Chemistry Recent Labs  Lab 03/14/23 0818  NA 136  K 4.5  CL 100  CO2 25  GLUCOSE 96  BUN 19  CREATININE 0.73  CALCIUM 9.1  GFRNONAA >60  ANIONGAP 11    Hematology Recent Labs  Lab 03/14/23 0818  WBC 7.9  RBC 3.91  HGB 12.7  HCT 38.3  MCV 98.0  MCH 32.5  MCHC 33.2  RDW 14.1  PLT 187   High Sensitivity Troponin:   Recent Labs  Lab 03/14/23 0818 03/14/23 1026  TROPONINIHS 7 3     Cardiac EnzymesNo results for input(s): "TROPONINI" in the last 168 hours. No results for input(s): "TROPIPOC" in the last 168 hours.  BNPNo results for input(s): "BNP", "PROBNP" in the last 168 hours.  DDimer No results for input(s): "DDIMER" in the last 168 hours.  Hemoglobin A1c:  Lab Results  Component Value Date   HGBA1C 5.8 (H) 12/11/2019   MPG 119.76 12/11/2019   TSH No results for input(s): "TSH" in the last 8760 hours. Lipid Panel  Lab Results  Component Value Date   CHOL 202 (H) 02/19/2019   HDL 71 02/19/2019   LDLCALC 114 (H) 02/19/2019   TRIG 83 02/19/2019   CHOLHDL 2.8 02/19/2019   Drugs of Abuse  No results found for:  "LABOPIA", "COCAINSCRNUR", "LABBENZ", "AMPHETMU", "THCU", "LABBARB"    CARDIAC DATABASE: EKG: March 14, 2023 sinus rhythm, 64 bpm, LVH per voltage criteria, without underlying ischemia or injury pattern.  Echocardiogram: 02/2017: - Left ventricle: The cavity  size was normal. There was severe    focal basal and mild concentric hypertrophy. Systolic function    was normal. The estimated ejection fraction was in the range of    60% to 65%. Wall motion was normal; there were no regional wall    motion abnormalities. There was an increased relative    contribution of atrial contraction to ventricular filling.    Doppler parameters are consistent with abnormal left ventricular    relaxation (grade 1 diastolic dysfunction). Doppler parameters    are consistent with high ventricular filling pressure.  - Aortic valve: There was trivial regurgitation.  - Mitral valve: There was mild regurgitation. Valve area by    pressure half-time: 2.44 cm^2.  - Pulmonic valve: There was mild regurgitation.   Stress Testing:  Myocardial perfusion imaging September 2020:  Nuclear stress EF: 67%.  No T wave inversion was noted during stress.  There was no ST segment deviation noted during stress.  This is a low risk study.   No reversible ischemia. LVEF 67% with normal wall motion. This is a low risk study.   Heart Catheterization: August 2018 Dominant circumflex with distal 95% stenosis beyond which an inferolateral wall obtuse marginal and the PDA arise.  Otherwise widely patent coronary arteries with marked tortuosity and angulation. Right coronary is nondominant.  Successful DES implantation reducing the 95% stenosis to 0% with TIMI grade 3 flow using a 3.0 x 15 mm study stent.  Chronic diastolic heart failure with LVEDP of 20 mmHg and normal overall systolic function with EF 60%.   Scheduled Meds:  acetaminophen  650 mg Oral Once   aflibercept  2 mg Intravitreal    aflibercept  2 mg  Intravitreal    aflibercept  2 mg Intravitreal    aflibercept  2 mg Intravitreal    aflibercept  2 mg Intravitreal    Bevacizumab  1.25 mg Intravitreal    Bevacizumab  1.25 mg Intravitreal    Bevacizumab  1.25 mg Intravitreal     Continuous Infusions:   PRN Meds: aflibercept, aflibercept, aflibercept, aflibercept, aflibercept, Bevacizumab, Bevacizumab, Bevacizumab, nitroGLYCERIN, oxyCODONE-acetaminophen  IMPRESSION & RECOMMENDATIONS: Marissa Caldwell is a 89 y.o. Caucasian female whose past medical history and cardiovascular risk factors include: History of coronary artery disease status post PCI to the LCx, prior history of diastolic heart failure, hyperlipidemia, hypothyroidism, COPD, smoker, history of breast cancer, history of colon cancer, pulmonary nodule.  Impression:  Precordial discomfort Hypertensive urgency Established coronary artery disease with prior PCI to the LCx Extensive history of smoking. COPD. History of breast cancer. History of colon cancer. Pulmonary nodule suspicious for malignancy  Plan:  Patient presents to the hospital with a chief complaint of chest wall pain.  Symptoms or not suggestive of anginal discomfort.  High sensitive troponins are negative x 2.  EKG is nonischemic.  Chest wall pain improves with Tylenol.  Cardiology was asked to weigh in with regards to additional workup given her chest pain.  Currently patient is resting in bed comfortably, asymptomatic, hemodynamics stable.  Overall euvolemic and not in congestive heart failure.  CT PE protocol study negative for pulmonary embolism & pericardial effusion per report.  Given history of diastolic dysfunction, radiation and chemotherapy will check BNP, ESR, CRP.  Infectious workup per primary team -given her immunocompromise state.  Plan echo to evaluate for LVEF, regional wall motion abnormalities, and structural heart disease.  Resume home blood pressure medications-per primary  team.  Further recommendations to follow.   Patient's  questions and concerns were addressed to her satisfaction. She voices understanding of the instructions provided during this encounter.   This note was created using a voice recognition software as a result there may be grammatical errors inadvertently enclosed that do not reflect the nature of this encounter. Every attempt is made to correct such errors.  Delilah Shan [facility]  Pager:  [phone] Office: [phone] 03/14/2023, 8:13 PM

## 2023-03-14 NOTE — ED Notes (Signed)
Patient transported to CT 

## 2023-03-14 NOTE — ED Notes (Signed)
Nurse attempted IV insertion, unsuccessful at this time.

## 2023-03-14 NOTE — Progress Notes (Addendum)
ON-CALL CARDIOLOGY 03/14/23  Patient's name: Marissa Caldwell.   MRN: [id number].    DOB: 09-05-1931 Primary care provider: Laurann Montana, MD. Former cardiologist : Dr. Chilton Si Primary cardiologist: NA  Referring provider: Margarita Rana    Interaction regarding this patient's care today: 89 year old female presents to the hospital with a chief complaint of chest wall pain ongoing for the last 2 or 3 days worse with deep breathing.  History of colon cancer, breast cancer, and pulmonary nodule.   Last saw Dr. Duke Salvia in August 2019.  History of CAD with PCI to the LCx.  Initial vital signs 171/70, pulse of 64, respiratory rate of 19, 98% on room air.  Renal function at baseline.  High sensitive troponins negative x 2.  CT PE protocol negative for pulmonary embolism per report.  17 x 12 mm density in the right upper lobe consistent with malignancy.  Coronary artery calcification and aortic atherosclerosis.  See report for additional details.  EKG shows sinus rhythm without underlying schema or injury pattern.  Impression: Precordial pain (chest wall and pleuritic discomfort) Hypertensive urgency (SBP ranges 156-138mmHg) Established coronary artery disease with nonanginal discomfort History of breast and colon cancer  Recommendations: Spoke to ED physician assistant regarding her case.  I was asked to weigh in regarding either inpatient or outpatient evaluation.   Symptoms are suggestive of pleuritic chest pain, EKG not consistent with ACS, and high sensitive troponins are essentially negative x 2.  CT PE protocol negative for pulmonary embolism per report.  Recommend either outpatient evaluation with either myself or Dr. Duke Salvia OR inpatient evaluation if thats what patient and family wishes.  Recommend better blood pressure management.  Will await final decision as per patient/family. Will be more than happy to see if they decide inpatient workup.  ED PA to close the loop.   Delilah Shan [facility]  Pager:  [phone] Office: [phone] 6:25 PM 03/14/23

## 2023-03-15 ENCOUNTER — Observation Stay (HOSPITAL_BASED_OUTPATIENT_CLINIC_OR_DEPARTMENT_OTHER): Payer: Medicare Other

## 2023-03-15 DIAGNOSIS — I340 Nonrheumatic mitral (valve) insufficiency: Secondary | ICD-10-CM | POA: Diagnosis not present

## 2023-03-15 DIAGNOSIS — R072 Precordial pain: Secondary | ICD-10-CM | POA: Diagnosis not present

## 2023-03-15 DIAGNOSIS — I2510 Atherosclerotic heart disease of native coronary artery without angina pectoris: Secondary | ICD-10-CM | POA: Diagnosis not present

## 2023-03-15 DIAGNOSIS — Z87891 Personal history of nicotine dependence: Secondary | ICD-10-CM | POA: Diagnosis not present

## 2023-03-15 DIAGNOSIS — R071 Chest pain on breathing: Secondary | ICD-10-CM | POA: Diagnosis not present

## 2023-03-15 DIAGNOSIS — I160 Hypertensive urgency: Secondary | ICD-10-CM | POA: Diagnosis not present

## 2023-03-15 LAB — CBC
HCT: 36.3 % (ref 36.0–46.0)
Hemoglobin: 12.3 g/dL (ref 12.0–15.0)
MCH: 32.5 pg (ref 26.0–34.0)
MCHC: 33.9 g/dL (ref 30.0–36.0)
MCV: 95.8 fL (ref 80.0–100.0)
Platelets: 176 10*3/uL (ref 150–400)
RDW: 14 % (ref 11.5–15.5)
WBC: 9.1 10*3/uL (ref 4.0–10.5)
nRBC: 0 % (ref 0.0–0.2)

## 2023-03-15 LAB — ECHOCARDIOGRAM COMPLETE
AR max vel: 3.12 cm2
AV Area VTI: 3.06 cm2
AV Area mean vel: 2.92 cm2
AV Mean grad: 8 mmHg
AV Peak grad: 13.4 mmHg
Ao pk vel: 1.83 m/s
Area-P 1/2: 1.11 cm2
Calc EF: 72.2 %
Height: 70 in
MV M vel: 5.7 m/s
MV Peak grad: 129.7 mmHg
MV VTI: 3.4 cm2
P 1/2 time: 549 msec
S' Lateral: 1.8 cm
Single Plane A2C EF: 70.4 %
Single Plane A4C EF: 75.8 %
Weight: 2151.69 oz

## 2023-03-15 LAB — TSH: TSH: 1.728 u[IU]/mL (ref 0.350–4.500)

## 2023-03-15 LAB — BASIC METABOLIC PANEL
Anion gap: 10 (ref 5–15)
BUN: 16 mg/dL (ref 8–23)
CO2: 22 mmol/L (ref 22–32)
Calcium: 8.3 mg/dL — ABNORMAL LOW (ref 8.9–10.3)
Chloride: 100 mmol/L (ref 98–111)
Creatinine, Ser: 0.72 mg/dL (ref 0.44–1.00)
GFR, Estimated: >60 mL/min (ref >60)
Glucose, Bld: 111 mg/dL — ABNORMAL HIGH (ref 70–99)
Potassium: 3.3 mmol/L — ABNORMAL LOW (ref 3.5–5.1)
Sodium: 132 mmol/L — ABNORMAL LOW (ref 135–145)

## 2023-03-15 LAB — BRAIN NATRIURETIC PEPTIDE: B Natriuretic Peptide: 236.9 pg/mL — ABNORMAL HIGH (ref 0.0–100.0)

## 2023-03-15 LAB — SEDIMENTATION RATE: Sed Rate: 23 mm/hr — ABNORMAL HIGH (ref 0–22)

## 2023-03-15 LAB — C-REACTIVE PROTEIN: CRP: 5.8 mg/dL — ABNORMAL HIGH (ref <1.0)

## 2023-03-15 MED ORDER — ASPIRIN EC 81 MG PO TBEC: 81.0000 mg | DELAYED_RELEASE_TABLET | Freq: Every day | ORAL | 0 refills | Status: AC

## 2023-03-15 MED ORDER — FUROSEMIDE 20 MG PO TABS
10.0000 mg | ORAL_TABLET | Freq: Every morning | ORAL | Status: DC
  Administered 2023-03-15: 10 mg via ORAL
  Filled 2023-03-15: qty 1

## 2023-03-15 MED ORDER — POTASSIUM CHLORIDE CRYS ER 20 MEQ PO TBCR
40.0000 meq | EXTENDED_RELEASE_TABLET | Freq: Once | ORAL | Status: AC
  Administered 2023-03-15: 40 meq via ORAL
  Filled 2023-03-15: qty 2

## 2023-03-15 MED ORDER — BRIMONIDINE TARTRATE 0.2 % OP SOLN: 1.0000 [drp] | Freq: Two times a day (BID) | OPHTHALMIC | Status: DC

## 2023-03-15 MED ORDER — LOSARTAN POTASSIUM 25 MG PO TABS: 12.5000 mg | ORAL_TABLET | Freq: Every day | ORAL | Status: DC

## 2023-03-15 NOTE — ED Notes (Signed)
Discharge instructions discussed with son, Merlyn Albert, who verbalized understanding.

## 2023-03-15 NOTE — Progress Notes (Signed)
  Echocardiogram 2D Echocardiogram has been performed.  Marissa Caldwell 03/15/2023, 8:46 AM

## 2023-03-15 NOTE — Discharge Summary (Signed)
Physician Discharge Summary  Marissa Caldwell ZOX:[id number] DOB: Mar 26, 1932 DOA: 03/14/2023  PCP: Laurann Montana, MD  Admit date: 03/14/2023 Discharge date: 03/15/2023    Admitted From: Home Disposition: Home  Recommendations for Outpatient Follow-up:  Follow up with PCP in 1-2 weeks Please obtain BMP/CBC in one week Follow-up with cardiology as they have set up or as needed. Please follow up with your PCP on the following pending results: Unresulted Labs (From admission, onward)     Start     Ordered   03/21/23 0500  Creatinine, serum  (enoxaparin (LOVENOX)    CrCl >/= 30 ml/min)  Weekly,   R     Comments: while on enoxaparin therapy    03/14/23 2106              Home Health: None Equipment/Devices: None  Discharge Condition: Stable CODE STATUS: Full code Diet recommendation: Cardiac  Following HPI and ED course is copied from admitting hospitalist colleagues H&P: HPI: Marissa Caldwell is a pleasant 89 y.o. female with medical history significant for breast cancer status postlumpectomy and radiation, stage I colon cancer status post colectomy, lung nodule undergoing SBRT, CAD, COPD, chronic diastolic CHF, hearing impairment, and blindness who presents for evaluation of chest pain.    Patient reports 2 to 3 days of pain involving her chest wall that is worse when taking a deep breath and improved with Tylenol.  She has not noticed any change in her pain with exertion.  She feels short of breath when her pain becomes severe but denies any change in her chronic cough and does not have any dyspnea now that her pain is resolved.  She denies any recent fever or chills.   ED Course: Upon arrival to the ED, patient is found to be afebrile and saturating well on room air normal heart rate and elevated blood pressure.  Chemistry panel, CBC, and troponin x 2 are normal.  CTA chest is negative for definite PE but notable for right upper lobe and left lower lobe densities.   Etiology  was consulted by the ED PA and the patient was given 500 mL of normal saline, 2 doses of morphine, and Percocet in the ED.  Subjective: Seen and in the ED.  She said that her chest pain is much better but it gets worse with movement of the left upper extremity suggesting possible musculoskeletal pain.  She was stable, discussed plan of discharge and she was agreeable.  Brief/Interim Summary: Patient basically was admitted for atypical chest pain, ruled out of MI with negative cardiac enzymes, seen by cardiology, remained hemodynamically stable, had echo done which ruled out any wall motion abnormality and she had normal ejection fraction.  Reevaluated by cardiology today.  This is atypical pain and likely musculoskeletal.  No need of further cardiac workup however cardiology recommended to the patient that she can follow-up with them and have outpatient further cardiac workup if she has further chest pain.  Cardiologist Dr. Odis Hollingshead also has discussed with the son the plan of discharge and he is in agreement.  Of note, she has mild hypokalemia, we are replacing her potassium before discharge.  Discharge plan was discussed with patient and/or family member and they verbalized understanding and agreed with it.  Discharge Diagnoses:  Principal Problem:   Chest pain Active Problems:   COPD (chronic obstructive pulmonary disease) (HCC)   Malignant neoplasm of upper-outer quadrant of right breast in female, estrogen receptor positive (HCC)   Hypertensive urgency  Former smoker   Hx of breast cancer   Hx of colon cancer, stage I   Pulmonary nodule   Atherosclerosis of native coronary artery of native heart without angina pectoris    Discharge Instructions   Allergies as of 03/15/2023       Reactions   Lipitor [atorvastatin]    Elevated LFT's        Medication List     TAKE these medications    albuterol 108 (90 Base) MCG/ACT inhaler Commonly known as: VENTOLIN HFA Inhale 2 puffs into  the lungs every 6 (six) hours as needed for wheezing or shortness of breath.   aspirin EC 81 MG tablet Take 1 tablet (81 mg total) by mouth daily.   brimonidine 0.2 % ophthalmic solution Commonly known as: ALPHAGAN Place 1 drop into the left eye 2 (two) times daily.   budesonide-formoterol 160-4.5 MCG/ACT inhaler Commonly known as: SYMBICORT Inhale 2 puffs into the lungs 2 (two) times daily.   Calcium + D3 600-200 MG-UNIT Tabs Take 1 tablet by mouth daily.   CoQ10 100 MG Caps Take 100 mg by mouth daily.   diclofenac 50 MG tablet Commonly known as: CATAFLAM Take 1 tablet (50 mg total) by mouth daily after breakfast.   famotidine 20 MG tablet Commonly known as: PEPCID Take 20 mg by mouth daily.   FISH OIL PO Take 1 capsule by mouth daily.   gabapentin 100 MG capsule Commonly known as: NEURONTIN Take 1 capsule (100 mg total) by mouth at bedtime.   latanoprost 0.005 % ophthalmic solution Commonly known as: XALATAN Place 1 drop into both eyes at bedtime.   levothyroxine 100 MCG tablet Commonly known as: SYNTHROID Take 100 mcg by mouth daily before breakfast.   LORazepam 0.5 MG tablet Commonly known as: ATIVAN Take 0.25-0.5 mg by mouth every 8 (eight) hours as needed for anxiety.   LUTEIN PO Take 1 tablet by mouth daily.   mirtazapine 15 MG tablet Commonly known as: REMERON Take 15 mg by mouth at bedtime.   multivitamin tablet Take 1 tablet by mouth daily.   nitroGLYCERIN 0.4 MG SL tablet Commonly known as: NITROSTAT Place 1 tablet (0.4 mg total) under the tongue every 5 (five) minutes as needed for chest pain.   pravastatin 40 MG tablet Commonly known as: PRAVACHOL TAKE 1 TABLET BY MOUTH DAILY GENERIC EQUIVALENT FOR PRAVACHOL   tamoxifen 20 MG tablet Commonly known as: NOLVADEX TAKE 1 TABLET BY MOUTH ONCE DAILY . APPOINTMENT REQUIRED FOR FUTURE REFILLS   Toviaz 4 MG Tb24 tablet Generic drug: fesoterodine Take 4 mg by mouth daily.   traMADol 50 MG  tablet Commonly known as: ULTRAM Take 0.5-1 tablets (25-50 mg total) by mouth every 8 (eight) hours as needed.   Vitamin D3 50 MCG (2000 UT) capsule Take 4,000 Units by mouth daily.        Follow-up Information     Tolia, Sunit, DO. Call today.   Specialties: Cardiology, Vascular Surgery Why: Follow up in 1 month or sooner if needed. Contact information: [address] [place] [address] [phone]         Laurann Montana, MD. Schedule an appointment as soon as possible for a visit in 1 week(s).   Specialty: Family Medicine Why: Follow up s/p hospitalization.  Labs in one week to check renal function - started on medications. Contact information: [address] [place] [address] [phone]         S[place]land,  Aram Beecham, MD Follow up in 1 week(s).   Specialty: Family Medicine Contact information: [phone] [place] [address] [phone]                Allergies  Allergen Reactions   Lipitor [Atorvastatin]     Elevated LFT's    Consultations: Cardiology   Procedures/Studies: ECHOCARDIOGRAM COMPLETE  Result Date: 03/15/2023    ECHOCARDIOGRAM REPORT   Patient Name:   Marissa Caldwell Date of Exam: 03/15/2023 Medical Rec #:  [id number]        Height:       70.0 in Accession #:    [id number]       Weight:       134.5 lb Date of Birth:  [birth year]         BSA:          1.763 m Patient Age:    89 years         BP:           144/73 mmHg Patient Gender: F                HR:           67 bpm. Exam Location:  Inpatient Procedure: 2D Echo, Color Doppler and Cardiac Doppler Indications:    Chest pain  History:        Patient has prior history of Echocardiogram examinations, most                 recent 03/15/2017. CHF, CAD, COPD, Arrythmias:Bradycardia;                 Signs/Symptoms:Chest Pain. BC, colon CA.  Sonographer:    Milda Smart Referring Phys: [id number] KGMWN UUVOZ  Sonographer Comments: Image acquisition  challenging due to patient body habitus and Image acquisition challenging due to respiratory motion. IMPRESSIONS  1. Left ventricular ejection fraction, by estimation, is 65 to 70%. The left ventricle has normal function. The left ventricle has no regional wall motion abnormalities. There is moderate asymmetric left ventricular hypertrophy of the basal-septal segment. There was turbulence in the LV outflow tract but significant gradient was not identified. Possible mild mitral valve systolic anterior motion but not well-visualized. Left ventricular diastolic parameters are consistent with Grade I diastolic dysfunction (impaired relaxation).  2. Right ventricular systolic function is normal. The right ventricular size is normal. There is mildly elevated pulmonary artery systolic pressure. The estimated right ventricular systolic pressure is 37.1 mmHg.  3. The mitral valve is abnormal, possible mild SAM but not well-visualized. Moderate eccentric mitral valve regurgitation. No evidence of mitral stenosis.  4. The aortic valve is tricuspid. There is mild calcification of the aortic valve. Aortic valve regurgitation is mild. No aortic stenosis is present.  5. Aortic dilatation noted. There is mild dilatation of the ascending aorta, measuring 39 mm.  6. The inferior vena cava is normal in size with greater than 50% respiratory variability, suggesting right atrial pressure of 3 mmHg. FINDINGS  Left Ventricle: Left ventricular ejection fraction, by estimation, is 65 to 70%. The left ventricle has normal function. The left ventricle has no regional wall motion abnormalities. The left ventricular internal cavity size was normal in size. There is  moderate asymmetric left ventricular hypertrophy of the basal-septal segment. Left ventricular diastolic parameters are consistent with Grade I diastolic dysfunction (impaired relaxation). Right Ventricle: The right ventricular size is normal. No increase in right ventricular wall  thickness. Right ventricular systolic function is  normal. There is mildly elevated pulmonary artery systolic pressure. The tricuspid regurgitant velocity is 2.92  m/s, and with an assumed right atrial pressure of 3 mmHg, the estimated right ventricular systolic pressure is 37.1 mmHg. Left Atrium: Left atrial size was normal in size. Right Atrium: Right atrial size was normal in size. Pericardium: Trivial pericardial effusion is present. Mitral Valve: The mitral valve is abnormal. There is mild calcification of the mitral valve leaflet(s). Mild mitral annular calcification. Moderate mitral valve regurgitation. No evidence of mitral valve stenosis. MV peak gradient, 6.9 mmHg. The mean mitral valve gradient is 4.0 mmHg. Tricuspid Valve: The tricuspid valve is normal in structure. Tricuspid valve regurgitation is mild. Aortic Valve: The aortic valve is tricuspid. There is mild calcification of the aortic valve. Aortic valve regurgitation is mild. Aortic regurgitation PHT measures 549 msec. No aortic stenosis is present. Aortic valve mean gradient measures 8.0 mmHg. Aortic valve peak gradient measures 13.4 mmHg. Aortic valve area, by VTI measures 3.06 cm. Pulmonic Valve: The pulmonic valve was normal in structure. Pulmonic valve regurgitation is trivial. Aorta: The aortic root is normal in size and structure and aortic dilatation noted. There is mild dilatation of the ascending aorta, measuring 39 mm. Venous: The inferior vena cava is normal in size with greater than 50% respiratory variability, suggesting right atrial pressure of 3 mmHg. IAS/Shunts: No atrial level shunt detected by color flow Doppler.  LEFT VENTRICLE PLAX 2D LVIDd:         4.00 cm     Diastology LVIDs:         1.80 cm     LV e' medial:    3.05 cm/s LV PW:         1.30 cm     LV E/e' medial:  26.1 LV IVS:        1.40 cm     LV e' lateral:   3.81 cm/s LVOT diam:     2.10 cm     LV E/e' lateral: 20.9 LV SV:         135 LV SV Index:   76 LVOT Area:      3.46 cm  LV Volumes (MOD) LV vol d, MOD A2C: 47.9 ml LV vol d, MOD A4C: 52.4 ml LV vol s, MOD A2C: 14.2 ml LV vol s, MOD A4C: 12.7 ml LV SV MOD A2C:     33.7 ml LV SV MOD A4C:     52.4 ml LV SV MOD BP:      36.4 ml RIGHT VENTRICLE RV Basal diam:  3.60 cm RV S prime:     19.20 cm/s TAPSE (M-mode): 2.1 cm LEFT ATRIUM             Index        RIGHT ATRIUM           Index LA diam:        3.70 cm 2.10 cm/m   RA Area:     12.40 cm LA Vol (A2C):   35.7 ml 20.25 ml/m  RA Volume:   27.60 ml  15.65 ml/m LA Vol (A4C):   34.4 ml 19.51 ml/m LA Biplane Vol: 36.8 ml 20.87 ml/m  AORTIC VALVE AV Area (Vmax):    3.12 cm AV Area (Vmean):   2.92 cm AV Area (VTI):     3.06 cm AV Vmax:           183.00 cm/s AV Vmean:          140.000  cm/s AV VTI:            0.441 m AV Peak Grad:      13.4 mmHg AV Mean Grad:      8.0 mmHg LVOT Vmax:         165.00 cm/s LVOT Vmean:        118.000 cm/s LVOT VTI:          0.389 m LVOT/AV VTI ratio: 0.88 AI PHT:            549 msec  AORTA Ao Root diam: 3.10 cm Ao Asc diam:  3.90 cm MITRAL VALVE                TRICUSPID VALVE MV Area (PHT): 1.11 cm     TR Peak grad:   34.1 mmHg MV Area VTI:   3.40 cm     TR Mean grad:   25.0 mmHg MV Peak grad:  6.9 mmHg     TR Vmax:        292.00 cm/s MV Mean grad:  4.0 mmHg     TR Vmean:       244.0 cm/s MV Vmax:       1.31 m/s MV Vmean:      96.8 cm/s    SHUNTS MV Decel Time: 682 msec     Systemic VTI:  0.39 m MR Peak grad: 129.7 mmHg    Systemic Diam: 2.10 cm MR Mean grad: 103.0 mmHg MR Vmax:      569.50 cm/s MR Vmean:     488.0 cm/s MV E velocity: 79.70 cm/s MV A velocity: 116.00 cm/s MV E/A ratio:  0.69 Dalton McleanMD Electronically signed by Wilfred Lacy Signature Date/Time: 03/15/2023/8:58:44 AM    Final    CT Head Wo Contrast  Result Date: 03/14/2023 CLINICAL DATA:  Headache EXAM: CT HEAD WITHOUT CONTRAST TECHNIQUE: Contiguous axial images were obtained from the base of the skull through the vertex without intravenous contrast. RADIATION DOSE  REDUCTION: This exam was performed according to the departmental dose-optimization program which includes automated exposure control, adjustment of the mA and/or kV according to patient size and/or use of iterative reconstruction technique. COMPARISON:  Brain MRI 09/10/08 FINDINGS: Brain: No evidence of acute infarction, hemorrhage, hydrocephalus, extra-axial collection or mass lesion/mass effect. Vascular: No hyperdense vessel. Compared to prior MRI dated 09/10/2008 there is new presumably vascular calcification in the left frontal lobe (series 6, image 38). Skull: Normal. Negative for fracture or focal lesion. Sinuses/Orbits: No acute finding. Other: None. IMPRESSION: 1. No acute intracranial abnormality. 2. Compared to prior MRI dated 09/10/2008 there is new vascular calcification in the left frontal lobe. This is nonspecific and age indeterminate. If the patient has stroke-like symptoms, further evaluation with a brain MRI is recommended. Electronically Signed   By: Lorenza Cambridge M.D.   On: 03/14/2023 13:17   CT Angio Chest PE W and/or Wo Contrast  Result Date: 03/14/2023 CLINICAL DATA:  Chest wall pain. EXAM: CT ANGIOGRAPHY CHEST WITH CONTRAST TECHNIQUE: Multidetector CT imaging of the chest was performed using the standard protocol during bolus administration of intravenous contrast. Multiplanar CT image reconstructions and MIPs were obtained to evaluate the vascular anatomy. RADIATION DOSE REDUCTION: This exam was performed according to the departmental dose-optimization program which includes automated exposure control, adjustment of the mA and/or kV according to patient size and/or use of iterative reconstruction technique. CONTRAST:  75mL OMNIPAQUE IOHEXOL 350 MG/ML SOLN COMPARISON:  October 17, 2022.  November 06, 2022. FINDINGS: Cardiovascular: Satisfactory  opacification of the pulmonary arteries to the segmental level. No evidence of pulmonary embolism. Mild cardiomegaly is noted. Coronary artery  calcifications are noted. No pericardial effusion. Mediastinum/Nodes: No enlarged mediastinal, hilar, or axillary lymph nodes. Thyroid gland, trachea, and esophagus demonstrate no significant findings. Lungs/Pleura: No pneumothorax or pleural effusion is noted. Minimal bilateral posterior basilar subsegmental atelectasis. 17 x 12 mm density is again noted in right upper lobe consistent with history of malignancy as noted on PET scan. Stable 8 x 6 mm irregular density seen in superior segment of left lower lobe of indeterminate significance based on prior PET scan. Upper Abdomen: No acute abnormality. Musculoskeletal: No chest wall abnormality. No acute or significant osseous findings. Review of the MIP images confirms the above findings. IMPRESSION: No definite evidence of pulmonary embolus. 17 x 12 mm density is noted in right upper lobe consistent with malignancy as noted in prior PET scan. Grossly stable 8 x 6 mm irregular density is noted in superior segment of left lower lobe which is of indeterminate significance based on prior PET scan. Coronary artery calcifications are noted. Aortic Atherosclerosis (ICD10-I70.0). Electronically Signed   By: Lupita Raider M.D.   On: 03/14/2023 10:53   DG Chest 2 View  Result Date: 03/14/2023 CLINICAL DATA:  Chest pain EXAM: CHEST - 2 VIEW COMPARISON:  CT chest 10/17/2022 FINDINGS: Heart size is normal. Aortic atherosclerosis. No pleural fluid or interstitial edema. No airspace disease. Known right upper lobe lung lesion is of scared by overlying osseous structures. Thoracolumbar scoliosis. Surgical clips in the right axilla. IMPRESSION: No acute cardiopulmonary disease. Known right upper lobe lung lesion is obscured by overlying osseous structures. Electronically Signed   By: Signa Kell M.D.   On: 03/14/2023 08:47     Discharge Exam: Vitals:   03/15/23 0900 03/15/23 0915  BP: 138/69   Pulse: 68   Resp: 20   Temp:  98.2 F (36.8 C)  SpO2: 97%    Vitals:    03/15/23 0700 03/15/23 0800 03/15/23 0900 03/15/23 0915  BP: (!) 122/58 (!) 144/73 138/69   Pulse: 62 69 68   Resp: 14 15 20    Temp:    98.2 F (36.8 C)  TempSrc:    Oral  SpO2: 95% 97% 97%   Weight:      Height:        General: Pt is alert, awake, not in acute distress Cardiovascular: RRR, S1/S2 +, no rubs, no gallops Respiratory: CTA bilaterally, no wheezing, no rhonchi Abdominal: Soft, NT, ND, bowel sounds + Extremities: no edema, no cyanosis    The results of significant diagnostics from this hospitalization (including imaging, microbiology, ancillary and laboratory) are listed below for reference.     Microbiology: No results found for this or any previous visit (from the past 240 hour(s)).   Labs: BNP (last 3 results) Recent Labs    03/15/23 0445  BNP 236.9*   Basic Metabolic Panel: Recent Labs  Lab 03/14/23 0818 03/15/23 0445  NA 136 132*  K 4.5 3.3*  CL 100 100  CO2 25 22  GLUCOSE 96 111*  BUN 19 16  CREATININE 0.73 0.72  CALCIUM 9.1 8.3*   Liver Function Tests: No results for input(s): "AST", "ALT", "ALKPHOS", "BILITOT", "PROT", "ALBUMIN" in the last 168 hours. No results for input(s): "LIPASE", "AMYLASE" in the last 168 hours. No results for input(s): "AMMONIA" in the last 168 hours. CBC: Recent Labs  Lab 03/14/23 0818 03/15/23 0445  WBC 7.9 9.1  HGB  12.7 12.3  HCT 38.3 36.3  MCV 98.0 95.8  PLT 187 176   Cardiac Enzymes: No results for input(s): "CKTOTAL", "CKMB", "CKMBINDEX", "TROPONINI" in the last 168 hours. BNP: Invalid input(s): "POCBNP" CBG: No results for input(s): "GLUCAP" in the last 168 hours. D-Dimer No results for input(s): "DDIMER" in the last 72 hours. Hgb A1c No results for input(s): "HGBA1C" in the last 72 hours. Lipid Profile No results for input(s): "CHOL", "HDL", "LDLCALC", "TRIG", "CHOLHDL", "LDLDIRECT" in the last 72 hours. Thyroid function studies Recent Labs    03/15/23 0445  TSH 1.728   Anemia work  up No results for input(s): "VITAMINB12", "FOLATE", "FERRITIN", "TIBC", "IRON", "RETICCTPCT" in the last 72 hours. Urinalysis    Component Value Date/Time   COLORURINE STRAW (A) 03/14/2023 1020   APPEARANCEUR CLEAR 03/14/2023 1020   LABSPEC 1.009 03/14/2023 1020   PHURINE 7.0 03/14/2023 1020   GLUCOSEU NEGATIVE 03/14/2023 1020   HGBUR NEGATIVE 03/14/2023 1020   BILIRUBINUR NEGATIVE 03/14/2023 1020   KETONESUR NEGATIVE 03/14/2023 1020   PROTEINUR NEGATIVE 03/14/2023 1020   UROBILINOGEN 0.2 09/28/2017 1305   NITRITE NEGATIVE 03/14/2023 1020   LEUKOCYTESUR NEGATIVE 03/14/2023 1020   Sepsis Labs Recent Labs  Lab 03/14/23 0818 03/15/23 0445  WBC 7.9 9.1   Microbiology No results found for this or any previous visit (from the past 240 hour(s)).  FURTHER DISCHARGE INSTRUCTIONS:   Get Medicines reviewed and adjusted: Please take all your medications with you for your next visit with your Primary MD   Laboratory/radiological data: Please request your Primary MD to go over all hospital tests and procedure/radiological results at the follow up, please ask your Primary MD to get all Hospital records sent to his/her office.   In some cases, they will be blood work, cultures and biopsy results pending at the time of your discharge. Please request that your primary care M.D. goes through all the records of your hospital data and follows up on these results.   Also Note the following: If you experience worsening of your admission symptoms, develop shortness of breath, life threatening emergency, suicidal or homicidal thoughts you must seek medical attention immediately by calling 911 or calling your MD immediately  if symptoms less severe.   You must read complete instructions/literature along with all the possible adverse reactions/side effects for all the Medicines you take and that have been prescribed to you. Take any new Medicines after you have completely understood and accpet all the  possible adverse reactions/side effects.    Do not drive when taking Pain medications or sleeping medications (Benzodaizepines)   Do not take more than prescribed Pain, Sleep and Anxiety Medications. It is not advisable to combine anxiety,sleep and pain medications without talking with your primary care practitioner   Special Instructions: If you have smoked or chewed Tobacco  in the last 2 yrs please stop smoking, stop any regular Alcohol  and or any Recreational drug use.   Wear Seat belts while driving.   Please note: You were cared for by a hospitalist during your hospital stay. Once you are discharged, your primary care physician will handle any further medical issues. Please note that NO REFILLS for any discharge medications will be authorized once you are discharged, as it is imperative that you return to your primary care physician (or establish a relationship with a primary care physician if you do not have one) for your post hospital discharge needs so that they can reassess your need for medications and monitor your  lab values  Time coordinating discharge: Over 30 minutes  SIGNED:   Hughie Closs, MD  [facility] 03/15/2023, 10:08 AM *Please note that this is a verbal dictation therefore any spelling or grammatical errors are due to the "[employer] One" system interpretation. If 7PM-7AM, please contact night-coverage [email or web address]

## 2023-03-15 NOTE — Progress Notes (Signed)
Progress Note  Patient Name: Marissa Caldwell MRN: [id number] DOB: September 11, 1931 Date of Encounter: 03/15/2023  Attending physician: Hughie Closs, MD Primary care provider: Laurann Montana, MD  Subjective: Marissa Caldwell is a 89 y.o. Caucasian female who was seen and examined at bedside  Resting in bed comfortably. No events overnight. Chest pain responded well to Tylenol this morning. Telemetry notes sinus rhythm Case discussed and reviewed with her nurse.  Objective: Vital Signs in the last 24 hours: Temp:  [98 F (36.7 C)-98.8 F (37.1 C)] 98.1 F (36.7 C) (07/25 0509) Pulse Rate:  [60-77] 60 (07/25 0600) Resp:  [13-23] 15 (07/25 0600) BP: (129-197)/(59-97) 148/74 (07/25 0600) SpO2:  [94 %-99 %] 95 % (07/25 0600) Weight:  [61 kg] 61 kg (07/24 0815)  Intake/Output:  Intake/Output Summary (Last 24 hours) at 03/15/2023 0650 Last data filed at 03/15/2023 0509 Gross per 24 hour  Intake 500 ml  Output 100 ml  Net 400 ml    Net IO Since Admission: 400 mL [03/15/23 0650]  Weights:     03/14/2023    8:15 AM 12/05/2022   12:34 PM 10/19/2022    3:08 PM  Last 3 Weights  Weight (lbs) 134 lb 7.7 oz 132 lb 9.6 oz 137 lb 9.6 oz  Weight (kg) 61 kg 60.147 kg 62.415 kg      Telemetry:  Overnight telemetry shows sinus with occasional PVCs, which I personally reviewed.   Physical examination: PHYSICAL EXAM: Vitals:   03/15/23 0338 03/15/23 0500 03/15/23 0509 03/15/23 0600  BP:  (!) 143/69  (!) 148/74  Pulse:  67  60  Resp:  18  15  Temp: 98.1 F (36.7 C)  98.1 F (36.7 C)   TempSrc: Oral  Oral   SpO2:  95%  95%  Weight:      Height:        Physical Exam  Constitutional: She appears cachectic. No distress. She appears chronically ill.  temporal wasting, hemodynamically stable.   Neck: No JVD present.  Cardiovascular: Normal rate, regular rhythm, S1 normal, S2 normal, intact distal pulses and normal pulses. Exam reveals no gallop, no S3 and no S4.  Murmur  heard. Holosystolic murmur is present with a grade of 3/6 at the apex. Pulmonary/Chest: Effort normal. No stridor. She has no wheezes. She has no rales. She exhibits no tenderness.  Decreased breath sounds bilaterally.   Abdominal: Soft. Bowel sounds are normal. She exhibits no distension. There is no abdominal tenderness.  Musculoskeletal:        General: No edema.     Cervical back: Neck supple.  Neurological: She is alert and oriented to person, place, and time. She has intact cranial nerves (2-12).  Skin: Skin is warm and moist.  Ecchymosis bilateral legs.    Lab Results: Chemistry Recent Labs  Lab 03/14/23 0818 03/15/23 0445  NA 136 132*  K 4.5 3.3*  CL 100 100  CO2 25 22  GLUCOSE 96 111*  BUN 19 16  CREATININE 0.73 0.72  CALCIUM 9.1 8.3*  GFRNONAA >60 >60  ANIONGAP 11 10    Hematology Recent Labs  Lab 03/14/23 0818 03/15/23 0445  WBC 7.9 9.1  RBC 3.91 3.79*  HGB 12.7 12.3  HCT 38.3 36.3  MCV 98.0 95.8  MCH 32.5 32.5  MCHC 33.2 33.9  RDW 14.1 14.0  PLT 187 176   High Sensitivity Troponin:   Recent Labs  Lab 03/14/23 0818 03/14/23 1026  TROPONINIHS 7 3     Cardiac  EnzymesNo results for input(s): "TROPONINI" in the last 168 hours. No results for input(s): "TROPIPOC" in the last 168 hours.  BNP Recent Labs  Lab 03/15/23 0445  BNP 236.9*    DDimer No results for input(s): "DDIMER" in the last 168 hours.  Hemoglobin A1c:  Lab Results  Component Value Date   HGBA1C 5.8 (H) 12/11/2019   MPG 119.76 12/11/2019   TSH  Recent Labs    03/15/23 0445  TSH 1.728   Lipid Panel  Lab Results  Component Value Date   CHOL 202 (H) 02/19/2019   HDL 71 02/19/2019   LDLCALC 114 (H) 02/19/2019   TRIG 83 02/19/2019   CHOLHDL 2.8 02/19/2019   Drugs of Abuse  No results found for: "LABOPIA", "COCAINSCRNUR", "LABBENZ", "AMPHETMU", "THCU", "LABBARB"    Imaging: CT Head Wo Contrast  Result Date: 03/14/2023 CLINICAL DATA:  Headache EXAM: CT HEAD WITHOUT  CONTRAST TECHNIQUE: Contiguous axial images were obtained from the base of the skull through the vertex without intravenous contrast. RADIATION DOSE REDUCTION: This exam was performed according to the departmental dose-optimization program which includes automated exposure control, adjustment of the mA and/or kV according to patient size and/or use of iterative reconstruction technique. COMPARISON:  Brain MRI 09/10/08 FINDINGS: Brain: No evidence of acute infarction, hemorrhage, hydrocephalus, extra-axial collection or mass lesion/mass effect. Vascular: No hyperdense vessel. Compared to prior MRI dated 09/10/2008 there is new presumably vascular calcification in the left frontal lobe (series 6, image 38). Skull: Normal. Negative for fracture or focal lesion. Sinuses/Orbits: No acute finding. Other: None. IMPRESSION: 1. No acute intracranial abnormality. 2. Compared to prior MRI dated 09/10/2008 there is new vascular calcification in the left frontal lobe. This is nonspecific and age indeterminate. If the patient has stroke-like symptoms, further evaluation with a brain MRI is recommended. Electronically Signed   By: Lorenza Cambridge M.D.   On: 03/14/2023 13:17   CT Angio Chest PE W and/or Wo Contrast  Result Date: 03/14/2023 CLINICAL DATA:  Chest wall pain. EXAM: CT ANGIOGRAPHY CHEST WITH CONTRAST TECHNIQUE: Multidetector CT imaging of the chest was performed using the standard protocol during bolus administration of intravenous contrast. Multiplanar CT image reconstructions and MIPs were obtained to evaluate the vascular anatomy. RADIATION DOSE REDUCTION: This exam was performed according to the departmental dose-optimization program which includes automated exposure control, adjustment of the mA and/or kV according to patient size and/or use of iterative reconstruction technique. CONTRAST:  75mL OMNIPAQUE IOHEXOL 350 MG/ML SOLN COMPARISON:  October 17, 2022.  November 06, 2022. FINDINGS: Cardiovascular: Satisfactory  opacification of the pulmonary arteries to the segmental level. No evidence of pulmonary embolism. Mild cardiomegaly is noted. Coronary artery calcifications are noted. No pericardial effusion. Mediastinum/Nodes: No enlarged mediastinal, hilar, or axillary lymph nodes. Thyroid gland, trachea, and esophagus demonstrate no significant findings. Lungs/Pleura: No pneumothorax or pleural effusion is noted. Minimal bilateral posterior basilar subsegmental atelectasis. 17 x 12 mm density is again noted in right upper lobe consistent with history of malignancy as noted on PET scan. Stable 8 x 6 mm irregular density seen in superior segment of left lower lobe of indeterminate significance based on prior PET scan. Upper Abdomen: No acute abnormality. Musculoskeletal: No chest wall abnormality. No acute or significant osseous findings. Review of the MIP images confirms the above findings. IMPRESSION: No definite evidence of pulmonary embolus. 17 x 12 mm density is noted in right upper lobe consistent with malignancy as noted in prior PET scan. Grossly stable 8 x 6 mm irregular density  is noted in superior segment of left lower lobe which is of indeterminate significance based on prior PET scan. Coronary artery calcifications are noted. Aortic Atherosclerosis (ICD10-I70.0). Electronically Signed   By: Lupita Raider M.D.   On: 03/14/2023 10:53   DG Chest 2 View  Result Date: 03/14/2023 CLINICAL DATA:  Chest pain EXAM: CHEST - 2 VIEW COMPARISON:  CT chest 10/17/2022 FINDINGS: Heart size is normal. Aortic atherosclerosis. No pleural fluid or interstitial edema. No airspace disease. Known right upper lobe lung lesion is of scared by overlying osseous structures. Thoracolumbar scoliosis. Surgical clips in the right axilla. IMPRESSION: No acute cardiopulmonary disease. Known right upper lobe lung lesion is obscured by overlying osseous structures. Electronically Signed   By: Signa Kell M.D.   On: 03/14/2023 08:47     CARDIAC DATABASE: EKG: March 14, 2023 sinus rhythm, 64 bpm, LVH per voltage criteria, without underlying ischemia or injury pattern.   Echocardiogram: 03/15/2023  1. Left ventricular ejection fraction, by estimation, is 65 to 70%. The  left ventricle has normal function. The left ventricle has no regional  wall motion abnormalities. There is moderate asymmetric left ventricular  hypertrophy of the basal-septal  segment. There was turbulence in the LV outflow tract but significant  gradient was not identified. Possible mild mitral valve systolic anterior  motion but not well-visualized. Left ventricular diastolic parameters are  consistent with Grade I diastolic  dysfunction (impaired relaxation).   2. Right ventricular systolic function is normal. The right ventricular  size is normal. There is mildly elevated pulmonary artery systolic  pressure. The estimated right ventricular systolic pressure is 37.1 mmHg.   3. The mitral valve is abnormal, possible mild SAM but not  well-visualized. Moderate eccentric mitral valve regurgitation. No  evidence of mitral stenosis.   4. The aortic valve is tricuspid. There is mild calcification of the  aortic valve. Aortic valve regurgitation is mild. No aortic stenosis is  present.   5. Aortic dilatation noted. There is mild dilatation of the ascending  aorta, measuring 39 mm.   6. The inferior vena cava is normal in size with greater than 50%  respiratory variability, suggesting right atrial pressure of 3 mmHg.    Stress Testing:  Myocardial perfusion imaging September 2020: Nuclear stress EF: 67%. No T wave inversion was noted during stress. There was no ST segment deviation noted during stress. This is a low risk study.   No reversible ischemia. LVEF 67% with normal wall motion. This is a low risk study.     Heart Catheterization: August 2018 Dominant circumflex with distal 95% stenosis beyond which an inferolateral wall obtuse  marginal and the PDA arise.   Otherwise widely patent coronary arteries with marked tortuosity and angulation. Right coronary is nondominant.   Successful DES implantation reducing the 95% stenosis to 0% with TIMI grade 3 flow using a 3.0 x 15 mm study stent.   Chronic diastolic heart failure with LVEDP of 20 mmHg and normal overall systolic function with EF 60%.  Scheduled Meds:  aflibercept  2 mg Intravitreal    aflibercept  2 mg Intravitreal    aflibercept  2 mg Intravitreal    aflibercept  2 mg Intravitreal    aflibercept  2 mg Intravitreal    aspirin EC  81 mg Oral Daily   Bevacizumab  1.25 mg Intravitreal    Bevacizumab  1.25 mg Intravitreal    Bevacizumab  1.25 mg Intravitreal    brimonidine  1 drop Left Eye  BID   enoxaparin (LOVENOX) injection  40 mg Subcutaneous Q24H   latanoprost  1 drop Both Eyes QHS   levothyroxine  100 mcg Oral Q0600   mometasone-formoterol  2 puff Inhalation BID   pravastatin  40 mg Oral Daily   sodium chloride flush  3 mL Intravenous Q12H    Continuous Infusions:   PRN Meds: acetaminophen **OR** acetaminophen, aflibercept, aflibercept, aflibercept, aflibercept, aflibercept, albuterol, Bevacizumab, Bevacizumab, Bevacizumab, ondansetron **OR** ondansetron (ZOFRAN) IV, oxyCODONE, senna-docusate   IMPRESSION & RECOMMENDATIONS: Marissa Caldwell is a 89 y.o. Caucasian female whose past medical history and cardiac risk factors include:  History of coronary artery disease status post PCI to the LCx, prior history of diastolic heart failure, hyperlipidemia, hypothyroidism, COPD, smoker, history of breast cancer, history of colon cancer, pulmonary nodule.   Impression: Precordial pain. Hypertensive urgency on arrival. Established coronary disease with prior PCI to the LCx Extensive history of smoking. COPD. History of breast cancer. History of colon cancer. Pulmonary nodule suspicious for malignancy  Recommendations: Presented to the hospital with  a chief complaint of chest pain suggestive of anterior chest wall pain/pleuritic discomfort.  Symptoms not suggestive of anginal discomfort.  High sensitive troponins negative x 2.  EKG is nonischemic.  And chest pain improved with Tylenol.  Recommended outpatient follow-up initially; however, patient did not have a ride back home and therefore she was admitted under observation and cardiology asked to evaluate.  CT PE protocol is negative for pulmonary embolism and pericardial effusion per report.  ESR and CRP are elevated.  BNP above normal limits at 236.  Clinically not in congestive heart failure.  But does carry history of HFpEF.  Medications have been limited due to orthostasis/hypotension in the past.  Echocardiogram notes preserved LVEF, grade 1 diastolic impairment, no regional wall motion abnormalities, no significant valvular heart disease.  For now continue Lasix 10 mg p.o. every morning and losartan 12.5 mg p.o. every afternoon.    Currently on pravastatin 40 mg p.o. nightly.  Spoke with patient's son and daughter-in-law over the phone and gave them an update regarding her care during his hospitalization.  They have been informed that her chest pain based on symptoms appears to be noncardiac, cardiac enzymes are negative, and EKG does not illustrate concerns for acute heart attack.  Echocardiogram performed this morning shows normal LVEF without any significant valvular heart disease.  Given her advanced age and no ACS recommend conservative management.  Both son and daughter-in-law agreed.  They also confirmed that the patient is legally blind and deaf.  It is very reasonable to continue losartan and Lasix as discussed above.  She is more than welcome to see me back in the office once discharged.  They are in agreement with the plan of care and recommendations conveyed to the Dr. Jacqulyn Bath, attending physician.  This note was created using a voice recognition software as a result  there may be grammatical errors inadvertently enclosed that do not reflect the nature of this encounter. Every attempt is made to correct such errors.  Delilah Shan [facility]  Pager:  [phone] Office: [phone] 03/15/2023, 5:45 PM

## 2023-03-19 ENCOUNTER — Encounter (INDEPENDENT_AMBULATORY_CARE_PROVIDER_SITE_OTHER): Payer: Medicare Other | Admitting: Ophthalmology

## 2023-03-19 ENCOUNTER — Encounter (INDEPENDENT_AMBULATORY_CARE_PROVIDER_SITE_OTHER): Payer: Self-pay

## 2023-03-19 ENCOUNTER — Ambulatory Visit: Payer: Medicare Other | Admitting: Hematology

## 2023-03-19 ENCOUNTER — Other Ambulatory Visit: Payer: Medicare Other

## 2023-03-19 DIAGNOSIS — H353213 Exudative age-related macular degeneration, right eye, with inactive scar: Secondary | ICD-10-CM

## 2023-03-19 DIAGNOSIS — H401331 Pigmentary glaucoma, bilateral, mild stage: Secondary | ICD-10-CM

## 2023-03-19 DIAGNOSIS — Z961 Presence of intraocular lens: Secondary | ICD-10-CM

## 2023-03-19 DIAGNOSIS — H04123 Dry eye syndrome of bilateral lacrimal glands: Secondary | ICD-10-CM

## 2023-03-19 DIAGNOSIS — H43813 Vitreous degeneration, bilateral: Secondary | ICD-10-CM

## 2023-03-19 DIAGNOSIS — H353124 Nonexudative age-related macular degeneration, left eye, advanced atrophic with subfoveal involvement: Secondary | ICD-10-CM

## 2023-03-25 NOTE — Assessment & Plan Note (Deleted)
-  pT2N0M0, stage I  -She was diagnosed in 09/2019 by colonoscopy with Dr Lavon Paganini. -She underwent right colectomy on 12/15/19 with Dr. Carolynne Edouard. 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 Negative margins and LNs.  -restaging PET 06/13/21 was NED -Continue cancer surveillance with labs and clinical follow-up, I do not plan to repeat surveillance CT scan.

## 2023-03-25 NOTE — Assessment & Plan Note (Deleted)
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 Margins negative. -she received adjuvant RT with Dr. Basilio Cairo 08/05/19-09/03/19.  -She started Tamoxifen in 03/2019 and held 05/2019-07/2019 for surgery.  -most recent MM 04/05/21 was benign -From a breast cancer standpoint, she is stable.  -Continue Tamoxifen until August 2025  -Continue surveillance with yearly mammograms

## 2023-03-26 ENCOUNTER — Ambulatory Visit: Payer: Medicare Other | Admitting: Hematology

## 2023-03-26 ENCOUNTER — Other Ambulatory Visit: Payer: Medicare Other

## 2023-03-26 DIAGNOSIS — Z170 Estrogen receptor positive status [ER+]: Secondary | ICD-10-CM

## 2023-03-26 DIAGNOSIS — C182 Malignant neoplasm of ascending colon: Secondary | ICD-10-CM

## 2023-03-28 ENCOUNTER — Emergency Department (HOSPITAL_BASED_OUTPATIENT_CLINIC_OR_DEPARTMENT_OTHER): Payer: Medicare Other | Admitting: Radiology

## 2023-03-28 ENCOUNTER — Other Ambulatory Visit (HOSPITAL_BASED_OUTPATIENT_CLINIC_OR_DEPARTMENT_OTHER): Payer: Self-pay

## 2023-03-28 ENCOUNTER — Other Ambulatory Visit: Payer: Self-pay

## 2023-03-28 ENCOUNTER — Emergency Department (HOSPITAL_BASED_OUTPATIENT_CLINIC_OR_DEPARTMENT_OTHER)
Admission: EM | Admit: 2023-03-28 | Discharge: 2023-03-28 | Disposition: A | Discharge status: Home / Self Care | Payer: Medicare Other | Attending: Emergency Medicine | Admitting: Emergency Medicine

## 2023-03-28 DIAGNOSIS — R0602 Shortness of breath: Secondary | ICD-10-CM | POA: Diagnosis not present

## 2023-03-28 DIAGNOSIS — Z7982 Long term (current) use of aspirin: Secondary | ICD-10-CM | POA: Insufficient documentation

## 2023-03-28 DIAGNOSIS — Z85118 Personal history of other malignant neoplasm of bronchus and lung: Secondary | ICD-10-CM | POA: Insufficient documentation

## 2023-03-28 DIAGNOSIS — I700 Atherosclerosis of aorta: Secondary | ICD-10-CM | POA: Diagnosis not present

## 2023-03-28 DIAGNOSIS — R9431 Abnormal electrocardiogram [ECG] [EKG]: Secondary | ICD-10-CM | POA: Insufficient documentation

## 2023-03-28 DIAGNOSIS — U071 COVID-19: Secondary | ICD-10-CM | POA: Diagnosis not present

## 2023-03-28 DIAGNOSIS — Z853 Personal history of malignant neoplasm of breast: Secondary | ICD-10-CM | POA: Insufficient documentation

## 2023-03-28 LAB — CBC
HCT: 39.9 % (ref 36.0–46.0)
Hemoglobin: 13.7 g/dL (ref 12.0–15.0)
MCH: 31.9 pg (ref 26.0–34.0)
MCHC: 34.3 g/dL (ref 30.0–36.0)
MCV: 93 fL (ref 80.0–100.0)
Platelets: 344 10*3/uL (ref 150–400)
RBC: 4.29 MIL/uL (ref 3.87–5.11)
RDW: 13.3 % (ref 11.5–15.5)
WBC: 8.3 10*3/uL (ref 4.0–10.5)
nRBC: 0 % (ref 0.0–0.2)

## 2023-03-28 LAB — COMPREHENSIVE METABOLIC PANEL WITH GFR
ALT: 19 U/L (ref 0–44)
AST: 48 U/L — ABNORMAL HIGH (ref 15–41)
Albumin: 4 g/dL (ref 3.5–5.0)
Alkaline Phosphatase: 49 U/L (ref 38–126)
Anion gap: 9 (ref 5–15)
BUN: 17 mg/dL (ref 8–23)
CO2: 27 mmol/L (ref 22–32)
Calcium: 8.8 mg/dL — ABNORMAL LOW (ref 8.9–10.3)
Chloride: 98 mmol/L (ref 98–111)
Creatinine, Ser: 0.62 mg/dL (ref 0.44–1.00)
GFR, Estimated: >60 mL/min
Glucose, Bld: 104 mg/dL — ABNORMAL HIGH (ref 70–99)
Potassium: 5.6 mmol/L — ABNORMAL HIGH (ref 3.5–5.1)
Sodium: 134 mmol/L — ABNORMAL LOW (ref 135–145)
Total Bilirubin: 0.4 mg/dL (ref 0.3–1.2)
Total Protein: 7.4 g/dL (ref 6.5–8.1)

## 2023-03-28 LAB — DIFFERENTIAL
Abs Immature Granulocytes: 0.02 K/uL (ref 0.00–0.07)
Basophils Absolute: 0 K/uL (ref 0.0–0.1)
Basophils Relative: 0 %
Eosinophils Absolute: 0 K/uL (ref 0.0–0.5)
Eosinophils Relative: 0 %
Immature Granulocytes: 0 %
Lymphocytes Relative: 15 %
Lymphs Abs: 1.2 K/uL (ref 0.7–4.0)
Monocytes Absolute: 0.8 K/uL (ref 0.1–1.0)
Monocytes Relative: 10 %
Neutro Abs: 6.2 K/uL (ref 1.7–7.7)
Neutrophils Relative %: 75 %

## 2023-03-28 LAB — BRAIN NATRIURETIC PEPTIDE: B Natriuretic Peptide: 140.4 pg/mL — ABNORMAL HIGH (ref 0.0–100.0)

## 2023-03-28 LAB — TROPONIN I (HIGH SENSITIVITY)
Troponin I (High Sensitivity): 7 ng/L (ref <18)
Troponin I (High Sensitivity): 8 ng/L

## 2023-03-28 LAB — SARS CORONAVIRUS 2 BY RT PCR: SARS Coronavirus 2 by RT PCR: POSITIVE — AB

## 2023-03-28 MED ORDER — ALBUTEROL SULFATE (2.5 MG/3ML) 0.083% IN NEBU
2.5000 mg | INHALATION_SOLUTION | Freq: Once | RESPIRATORY_TRACT | Status: AC
  Administered 2023-03-28: 2.5 mg via RESPIRATORY_TRACT
  Filled 2023-03-28: qty 3

## 2023-03-28 MED ORDER — PAXLOVID (300/100) 20 X 150 MG & 10 X 100MG PO TBPK
3.0000 | ORAL_TABLET | Freq: Two times a day (BID) | ORAL | 0 refills | Status: AC
  Filled 2023-03-28: qty 30, 5d supply, fill #0

## 2023-03-28 MED ORDER — IPRATROPIUM-ALBUTEROL 0.5-2.5 (3) MG/3ML IN SOLN
3.0000 mL | Freq: Once | RESPIRATORY_TRACT | Status: AC
  Administered 2023-03-28: 3 mL via RESPIRATORY_TRACT
  Filled 2023-03-28: qty 3

## 2023-03-28 MED ORDER — ALBUTEROL SULFATE HFA 108 (90 BASE) MCG/ACT IN AERS
2.0000 | INHALATION_SPRAY | RESPIRATORY_TRACT | Status: DC | PRN
  Administered 2023-03-28: 2 via RESPIRATORY_TRACT
  Filled 2023-03-28: qty 6.7

## 2023-03-28 NOTE — ED Triage Notes (Signed)
Pt via pov from home with sob x 1 week; she was recently hospitalized for the same. Pt has hx of lung and breat cancer. Pt alert & oriented, nad noted.

## 2023-03-28 NOTE — Progress Notes (Signed)
RT ambulated Pt on RA SATS 95% and HR 82. Pt said walking make her really tired.

## 2023-03-28 NOTE — ED Provider Notes (Signed)
[place] EMERGENCY DEPARTMENT AT [facility] Provider Note   CSN: [id number] Arrival date & time: 03/28/23  1135     History  Chief Complaint  Patient presents with   Shortness of Breath    Marissa Caldwell is a 89 y.o. female.   Shortness of Breath Associated symptoms: cough      89 year old female presenting to the emergency department with cough and shortness of breath for the past week.  She states that she has a history of lung and breast cancer.  She denies any chest pain.  She denies any fevers or chills.  She denies any known sick contacts.  She denies any lower extremity swelling.  Home Medications Prior to Admission medications   Medication Sig Start Date End Date Taking? Authorizing Provider  brimonidine-timolol (COMBIGAN) 0.2-0.5 % ophthalmic solution Apply to eye. 02/20/23  Yes [provider]  DULERA 200-5 MCG/ACT AERO Inhale 2 puffs into the lungs 2 (two) times daily. 12/05/22  Yes [provider]  nirmatrelvir & ritonavir (PAXLOVID, 300/100,) 20 x 150 MG & 10 x 100MG  TBPK Take 3 tablets by mouth 2 (two) times daily for 5 days. 03/28/23 04/02/23 Yes Ernie Avena, MD  solifenacin (VESICARE) 5 MG tablet Take 5 mg by mouth daily. 01/12/23  Yes [provider]  albuterol (PROVENTIL HFA;VENTOLIN HFA) 108 (90 Base) MCG/ACT inhaler Inhale 2 puffs into the lungs every 6 (six) hours as needed for wheezing or shortness of breath.    [provider]  aspirin EC 81 MG tablet Take 1 tablet (81 mg total) by mouth daily. 03/15/23 04/14/23  Hughie Closs, MD  brimonidine (ALPHAGAN) 0.2 % ophthalmic solution Place 1 drop into the left eye 2 (two) times daily. 03/15/23   Hughie Closs, MD  budesonide-formoterol (SYMBICORT) 160-4.5 MCG/ACT inhaler Inhale 2 puffs into the lungs 2 (two) times daily.    [provider]  Calcium Carb-Cholecalciferol (CALCIUM + D3) 600-200 MG-UNIT TABS Take 1 tablet by mouth daily.    [provider]   Cholecalciferol (VITAMIN D3) 2000 units capsule Take 4,000 Units by mouth daily.    [provider]  Coenzyme Q10 (COQ10) 100 MG CAPS Take 100 mg by mouth daily.    [provider]  diclofenac (CATAFLAM) 50 MG tablet Take 1 tablet (50 mg total) by mouth daily after breakfast. 04/14/22   Kerrin Champagne, MD  famotidine (PEPCID) 20 MG tablet Take 20 mg by mouth daily. 10/19/22   [provider]  gabapentin (NEURONTIN) 100 MG capsule Take 1 capsule (100 mg total) by mouth at bedtime. 12/22/21   Kerrin Champagne, MD  latanoprost (XALATAN) 0.005 % ophthalmic solution Place 1 drop into both eyes at bedtime.    [provider]  levothyroxine (SYNTHROID, LEVOTHROID) 100 MCG tablet Take 100 mcg by mouth daily before breakfast.  05/17/18   [provider]  LORazepam (ATIVAN) 0.5 MG tablet Take 0.25-0.5 mg by mouth every 8 (eight) hours as needed for anxiety. 10/23/22   [provider]  LUTEIN PO Take 1 tablet by mouth daily.    [provider]  mirtazapine (REMERON) 15 MG tablet Take 15 mg by mouth at bedtime.    [provider]  Multiple Vitamin (MULTIVITAMIN) tablet Take 1 tablet by mouth daily.    [provider]  nitroGLYCERIN (NITROSTAT) 0.4 MG SL tablet Place 1 tablet (0.4 mg total) under the tongue every 5 (five) minutes as needed for chest pain. 01/01/20   Abelino Derrick,  PA-C  Omega-3 Fatty Acids (FISH OIL PO) Take 1 capsule by mouth daily.    [provider]  pravastatin (PRAVACHOL) 40 MG tablet TAKE 1 TABLET BY MOUTH DAILY GENERIC EQUIVALENT FOR PRAVACHOL 10/06/20   Chilton Si, MD  tamoxifen (NOLVADEX) 20 MG tablet TAKE 1 TABLET BY MOUTH ONCE DAILY . APPOINTMENT REQUIRED FOR FUTURE REFILLS 09/15/22   Malachy Mood, MD  TOVIAZ 4 MG TB24 tablet Take 4 mg by mouth daily.  05/20/18   [provider]  traMADol (ULTRAM) 50 MG tablet Take 0.5-1 tablets (25-50 mg total) by mouth every 8 (eight) hours as needed. 09/15/22    Malachy Mood, MD      Allergies    Lipitor [atorvastatin]    Review of Systems   Review of Systems  Respiratory:  Positive for cough and shortness of breath.   All other systems reviewed and are negative.   Physical Exam Updated Vital Signs BP (!) 158/82   Pulse 66   Temp 98.5 F (36.9 C) (Oral)   Resp 20   Ht 5\' 10"  (1.778 m)   Wt 61 kg   SpO2 96%   BMI 19.30 kg/m  Physical Exam Vitals and nursing note reviewed.  Constitutional:      General: She is not in acute distress.    Appearance: She is well-developed.  HENT:     Head: Normocephalic and atraumatic.  Eyes:     Conjunctiva/sclera: Conjunctivae normal.  Cardiovascular:     Rate and Rhythm: Normal rate and regular rhythm.     Heart sounds: No murmur heard. Pulmonary:     Effort: Pulmonary effort is normal. No respiratory distress.     Breath sounds: Normal breath sounds.  Abdominal:     Palpations: Abdomen is soft.     Tenderness: There is no abdominal tenderness.  Musculoskeletal:        General: No swelling.     Cervical back: Neck supple.  Skin:    General: Skin is warm and dry.     Capillary Refill: Capillary refill takes less than 2 seconds.  Neurological:     Mental Status: She is alert.  Psychiatric:        Mood and Affect: Mood normal.     ED Results / Procedures / Treatments   Labs (all labs ordered are listed, but only abnormal results are displayed) Labs Reviewed  SARS CORONAVIRUS 2 BY RT PCR - Abnormal; Notable for the following components:      Result Value   SARS Coronavirus 2 by RT PCR POSITIVE (*)    All other components within normal limits  COMPREHENSIVE METABOLIC PANEL - Abnormal; Notable for the following components:   Sodium 134 (*)    Potassium 5.6 (*)    Glucose, Bld 104 (*)    Calcium 8.8 (*)    AST 48 (*)    All other components within normal limits  BRAIN NATRIURETIC PEPTIDE - Abnormal; Notable for the following components:   B Natriuretic Peptide 140.4 (*)    All  other components within normal limits  CBC  DIFFERENTIAL  CBC WITH DIFFERENTIAL/PLATELET  TROPONIN I (HIGH SENSITIVITY)  TROPONIN I (HIGH SENSITIVITY)    EKG EKG Interpretation Date/Time:  Wednesday March 28 2023 11:47:37 EDT Ventricular Rate:  66 PR Interval:  182 QRS Duration:  80 QT Interval:  452 QTC Calculation: 473 R Axis:   74  Text Interpretation: Normal sinus rhythm Septal infarct , age undetermined Abnormal ECG No significant change since  last tracing Confirmed by Ernie Avena ([id number]) on 03/28/2023 12:32:50 PM  Radiology No results found.  Procedures Procedures    Medications Ordered in ED Medications  ipratropium-albuterol (DUONEB) 0.5-2.5 (3) MG/3ML nebulizer solution 3 mL (3 mLs Nebulization Given 03/28/23 1203)  albuterol (PROVENTIL) (2.5 MG/3ML) 0.083% nebulizer solution 2.5 mg (2.5 mg Nebulization Given 03/28/23 1203)    ED Course/ Medical Decision Making/ A&P Clinical Course as of 03/30/23 1611  Wed Mar 28, 2023  1356 SARS Coronavirus 2 by RT PCR(!): POSITIVE [JL]    Clinical Course User Index [JL] Ernie Avena, MD                                 Medical Decision Making Amount and/or Complexity of Data Reviewed Labs: ordered. Decision-making details documented in ED Course. Radiology: ordered.  Risk Prescription drug management.    89 year old female presenting to the emergency department with cough and shortness of breath for the past week.  She states that she has a history of lung and breast cancer.  She denies any chest pain.  She denies any fevers or chills.  She denies any known sick contacts.  She denies any lower extremity swelling.  On arrival, the patient was vitally stable, physical exam generally unremarkable with clear lungs to auscultation bilaterally, saturating well on room air, not tachypneic or tachycardic.  Patient symptoms without evidence of volume overload.  Not wheezing on exam.  Cough is nonproductive.  Considered viral  infection, pneumonia, less likely COPD exacerbation, CHF exacerbation.  Laboratory evaluation revealed COVID-positive PCR testing which was positive.  CBC without a leukocytosis or anemia, troponin negative, BNP nonspecifically mildly elevated to 140, CMP with only mild hyperkalemia to 5.6, likely hemolysis.  No AKI.  Chest x-ray was performed which revealed no acute cardiac or pulmonary abnormality.  An EKG was unremarkable with no acute ischemic changes.  The patient was ambulatory in the emergency department with no desaturations on ambulation.  She is feeling symptomatically well after receiving a breathing treatment.  She is tolerating oral intake, vitally stable.  She is outside the window for Paxlovid.  I advised supportive care, Tylenol and ibuprofen, continued oral rehydration, follow-up and return to the emergency department in the event of any severe worsening symptoms.   Final Clinical Impression(s) / ED Diagnoses Final diagnoses:  COVID-19    Rx / DC Orders ED Discharge Orders          Ordered    nirmatrelvir & ritonavir (PAXLOVID, 300/100,) 20 x 150 MG & 10 x 100MG  TBPK  2 times daily        03/28/23 1532              Ernie Avena, MD 03/30/23 1611

## 2023-03-28 NOTE — Discharge Instructions (Addendum)
Your symptoms are due to COVID-19.  You were ambulatory in the emergency department with no desaturations, overall are well-appearing with stable vitals.  Return to the emergency department for any severe worsening symptoms to include shortness of breath, severe chest pain.  If you are able to monitor your pulse oximetry at home, return for any persistent pulse ox below 93%.

## 2023-03-29 DIAGNOSIS — U071 COVID-19: Secondary | ICD-10-CM | POA: Diagnosis not present

## 2023-04-02 ENCOUNTER — Telehealth: Payer: Self-pay | Admitting: *Deleted

## 2023-04-02 NOTE — Telephone Encounter (Signed)
CALLED PATIENT'S SON- FRED RIDGE TO INFORM OF CT FOR 04-10-23- ARRIVAL TIME- 12 PM, NO RESTRICTIONS TO TEST, PATIENT TO RECEIVE RESULTS FROM DR. SQUIRE ON 04-13-23 @ 11:40 AM, SPOKE WITH PATIENTS' SON- FRED RIDGE AND HE IS AWARE OF THESE APPTS. AND THE INSTRUCTIONS

## 2023-04-10 ENCOUNTER — Ambulatory Visit (HOSPITAL_COMMUNITY)
Admission: RE | Admit: 2023-04-10 | Discharge: 2023-04-10 | Disposition: A | Discharge status: Home / Self Care | Payer: Medicare Other | Source: Ambulatory Visit | Attending: Radiation Oncology | Admitting: Radiation Oncology

## 2023-04-10 DIAGNOSIS — C189 Malignant neoplasm of colon, unspecified: Secondary | ICD-10-CM | POA: Diagnosis not present

## 2023-04-10 DIAGNOSIS — C3411 Malignant neoplasm of upper lobe, right bronchus or lung: Secondary | ICD-10-CM | POA: Diagnosis not present

## 2023-04-11 ENCOUNTER — Ambulatory Visit: Payer: Self-pay | Admitting: Radiation Oncology

## 2023-04-13 ENCOUNTER — Ambulatory Visit: Payer: Medicare Other | Admitting: Radiation Oncology

## 2023-04-13 NOTE — Progress Notes (Signed)
Marissa Caldwell is here today for follow up post radiation to the lung and to review CT scan results from 04/10/2023. She completed SBRT 01/04/2023  Lung Side: Right  Does the patient complain of any of the following: Pain: Reports a constant dull ache to the left side of her chest, as well as constant lower back pain.  Shortness of breath w/wo exertion: SOB with exertion Cough: Reports she's been experiencing a productive cough for the past week or so (was worse earlier this month when she had COVID-19) Hemoptysis: Denies Pain with swallowing: Denies Swallowing/choking concerns: Yes--related to thick phlegm and narrowing of throat (per daughter).  Appetite: Reports a fair appetite Wt Readings from Last 3 Encounters:  04/17/23 132 lb 12.8 oz (60.2 kg)  03/28/23 134 lb 7.7 oz (61 kg)  03/14/23 134 lb 7.7 oz (61 kg)   Energy Level: Reports constant fatigue requiring frequent rest periods and naps.  Post radiation skin Changes: None  Additional comments if applicable:

## 2023-04-16 ENCOUNTER — Ambulatory Visit: Payer: Medicare Other

## 2023-04-17 ENCOUNTER — Ambulatory Visit
Admission: RE | Admit: 2023-04-17 | Discharge: 2023-04-17 | Disposition: A | Discharge status: Home / Self Care | Payer: Medicare Other | Source: Ambulatory Visit | Attending: Radiation Oncology | Admitting: Radiation Oncology

## 2023-04-17 VITALS — BP 101/89 | HR 75 | Temp 97.4°F | Resp 20 | Ht 70.0 in | Wt 132.8 lb

## 2023-04-17 DIAGNOSIS — R0602 Shortness of breath: Secondary | ICD-10-CM | POA: Diagnosis not present

## 2023-04-17 DIAGNOSIS — R079 Chest pain, unspecified: Secondary | ICD-10-CM | POA: Insufficient documentation

## 2023-04-17 DIAGNOSIS — I2510 Atherosclerotic heart disease of native coronary artery without angina pectoris: Secondary | ICD-10-CM | POA: Diagnosis not present

## 2023-04-17 DIAGNOSIS — Z79890 Hormone replacement therapy (postmenopausal): Secondary | ICD-10-CM | POA: Insufficient documentation

## 2023-04-17 DIAGNOSIS — Z853 Personal history of malignant neoplasm of breast: Secondary | ICD-10-CM | POA: Insufficient documentation

## 2023-04-17 DIAGNOSIS — I700 Atherosclerosis of aorta: Secondary | ICD-10-CM | POA: Insufficient documentation

## 2023-04-17 DIAGNOSIS — M5459 Other low back pain: Secondary | ICD-10-CM | POA: Diagnosis not present

## 2023-04-17 DIAGNOSIS — M8580 Other specified disorders of bone density and structure, unspecified site: Secondary | ICD-10-CM | POA: Insufficient documentation

## 2023-04-17 DIAGNOSIS — Z8616 Personal history of COVID-19: Secondary | ICD-10-CM | POA: Insufficient documentation

## 2023-04-17 DIAGNOSIS — C3411 Malignant neoplasm of upper lobe, right bronchus or lung: Secondary | ICD-10-CM | POA: Insufficient documentation

## 2023-04-17 DIAGNOSIS — Z87891 Personal history of nicotine dependence: Secondary | ICD-10-CM | POA: Diagnosis not present

## 2023-04-17 DIAGNOSIS — Z923 Personal history of irradiation: Secondary | ICD-10-CM | POA: Insufficient documentation

## 2023-04-17 DIAGNOSIS — Z79899 Other long term (current) drug therapy: Secondary | ICD-10-CM | POA: Diagnosis not present

## 2023-04-17 DIAGNOSIS — Z7951 Long term (current) use of inhaled steroids: Secondary | ICD-10-CM | POA: Insufficient documentation

## 2023-04-17 DIAGNOSIS — C50411 Malignant neoplasm of upper-outer quadrant of right female breast: Secondary | ICD-10-CM | POA: Diagnosis not present

## 2023-04-17 NOTE — Progress Notes (Incomplete)
Radiation Oncology         [phone] ________________________________  Name: Marissa Caldwell MRN: [id number]  Date: 04/17/2023  DOB: 1931/12/24  Follow-Up Visit Note  Outpatient  CC: Laurann Montana, MD  Laurann Montana, MD  Diagnosis:   Enlarging right upper lobe pulmonary nodule concerning for malignancy      ICD-10-CM   1. Malignant neoplasm of right upper lobe of lung (HCC)  C34.11     2. Malignant neoplasm of upper-outer quadrant of right breast in female, estrogen receptor positive (HCC)  C50.411    Z17.0        Cancer Staging  Cancer of right colon [facility]) Staging form: Colon and Rectum, AJCC 8th Edition - Pathologic stage from 12/15/2019: Stage I (pT2, pN0, cM0) - Signed by Malachy Mood, MD on 01/08/2020 Total positive nodes: 0 Histologic grading system: 4 grade system Histologic grade (G): G2 Residual tumor (R): R0 - None  Malignant neoplasm of upper-outer quadrant of right breast in female, estrogen receptor positive (HCC) Staging form: Breast, AJCC 8th Edition - Clinical: Stage IIA (cT1c, cN1, cM0, G3, ER+, PR+, HER2-) - Signed by Malachy Mood, MD on 04/09/2019 Histologic grading system: 3 grade system - Pathologic stage from 06/19/2019: Stage IB (pT1c, pN2a, cM0, G2, ER+, PR+, HER2-) - Signed by Malachy Mood, MD on 07/03/2019 Stage prefix: Initial diagnosis Multigene prognostic tests performed: None Histologic grading system: 3 grade system Residual tumor (R): R0 - None   CHIEF COMPLAINT: Here for follow-up and surveillance of lung cancer  Interval Since Last Radiation:  3 months  ==========DELIVERED PLANS==========  First Treatment Date: 2022-12-25 - Last Treatment Date: 2023-01-04   Plan Name: Lung_R_SBRT Site: Lung, Right Technique: SBRT/SRT-IMRT Mode: Photon Dose Per Fraction: 12 Gy Prescribed Dose (Delivered / Prescribed): 60 Gy / 60 Gy Prescribed Fxs (Delivered / Prescribed): 5 / 5  Narrative:  The patient returns today for routine follow-up and to  review most recent chest CT.   CT of the chest on 04/10/23 showed a mild decrease in size of the treated RUL pulmonary nodule; a new consolidation in the MLL lobe along with several peripheral lower lobe nodules favoring infectious etiology; stable LLL and LUL pulmonary nodules; and no thoracic adenopathy.   Does the patient complain of any of the following: Pain: Reports a constant dull ache to the left side of her chest, as well as constant lower back pain.  Shortness of breath w/wo exertion: SOB with exertion Cough: Reports she's been experiencing a productive cough for the past week or so (was worse earlier this month when she had COVID-19) Hemoptysis: Denies Pain with swallowing: Denies Swallowing/choking concerns: Yes--related to thick phlegm and narrowing of throat (per daughter).  Appetite: Reports a fair appetite    Wt Readings from Last 3 Encounters:  04/17/23 132 lb 12.8 oz (60.2 kg)  03/28/23 134 lb 7.7 oz (61 kg)  03/14/23 134 lb 7.7 oz (61 kg)    Energy Level: Reports constant fatigue requiring frequent rest periods and naps.  Post radiation skin Changes: None    ALLERGIES:  is allergic to lipitor [atorvastatin].  Meds: Current Outpatient Medications  Medication Sig Dispense Refill   albuterol (PROVENTIL HFA;VENTOLIN HFA) 108 (90 Base) MCG/ACT inhaler Inhale 2 puffs into the lungs every 6 (six) hours as needed for wheezing or shortness of breath.     brimonidine (ALPHAGAN) 0.2 % ophthalmic solution Place 1 drop into the left eye 2 (two) times daily.     brimonidine-timolol (COMBIGAN) 0.2-0.5 %  ophthalmic solution Apply to eye.     budesonide-formoterol (SYMBICORT) 160-4.5 MCG/ACT inhaler Inhale 2 puffs into the lungs 2 (two) times daily.     Calcium Carb-Cholecalciferol (CALCIUM + D3) 600-200 MG-UNIT TABS Take 1 tablet by mouth daily.     Cholecalciferol (VITAMIN D3) 2000 units capsule Take 4,000 Units by mouth daily.     Coenzyme Q10 (COQ10) 100 MG CAPS Take 100 mg by  mouth daily.     diclofenac (CATAFLAM) 50 MG tablet Take 1 tablet (50 mg total) by mouth daily after breakfast. 30 tablet 3   DULERA 200-5 MCG/ACT AERO Inhale 2 puffs into the lungs 2 (two) times daily.     famotidine (PEPCID) 20 MG tablet Take 20 mg by mouth daily.     gabapentin (NEURONTIN) 100 MG capsule Take 1 capsule (100 mg total) by mouth at bedtime. 30 capsule 3   latanoprost (XALATAN) 0.005 % ophthalmic solution Place 1 drop into both eyes at bedtime.     levothyroxine (SYNTHROID, LEVOTHROID) 100 MCG tablet Take 100 mcg by mouth daily before breakfast.   0   LORazepam (ATIVAN) 0.5 MG tablet Take 0.25-0.5 mg by mouth every 8 (eight) hours as needed for anxiety.     LUTEIN PO Take 1 tablet by mouth daily.     mirtazapine (REMERON) 15 MG tablet Take 15 mg by mouth at bedtime.     Multiple Vitamin (MULTIVITAMIN) tablet Take 1 tablet by mouth daily.     nitroGLYCERIN (NITROSTAT) 0.4 MG SL tablet Place 1 tablet (0.4 mg total) under the tongue every 5 (five) minutes as needed for chest pain. 25 tablet 3   Omega-3 Fatty Acids (FISH OIL PO) Take 1 capsule by mouth daily.     pravastatin (PRAVACHOL) 40 MG tablet TAKE 1 TABLET BY MOUTH DAILY GENERIC EQUIVALENT FOR PRAVACHOL 90 tablet 0   solifenacin (VESICARE) 5 MG tablet Take 5 mg by mouth daily.     tamoxifen (NOLVADEX) 20 MG tablet TAKE 1 TABLET BY MOUTH ONCE DAILY . APPOINTMENT REQUIRED FOR FUTURE REFILLS 90 tablet 3   TOVIAZ 4 MG TB24 tablet Take 4 mg by mouth daily.   1   traMADol (ULTRAM) 50 MG tablet Take 0.5-1 tablets (25-50 mg total) by mouth every 8 (eight) hours as needed. 30 tablet 0   Current Facility-Administered Medications  Medication Dose Route Frequency Provider Last Rate Last Admin   aflibercept (EYLEA) SOLN 2 mg  2 mg Intravitreal  Rennis Chris, MD   2 mg at 04/16/18 1424   aflibercept (EYLEA) SOLN 2 mg  2 mg Intravitreal  Rennis Chris, MD   2 mg at 05/14/18 1358   aflibercept (EYLEA) SOLN 2 mg  2 mg Intravitreal  Rennis Chris, MD   2 mg at 06/25/18 1433   aflibercept (EYLEA) SOLN 2 mg  2 mg Intravitreal  Rennis Chris, MD   2 mg at 08/07/18 1557   aflibercept (EYLEA) SOLN 2 mg  2 mg Intravitreal  Rennis Chris, MD   2 mg at 10/11/18 1522   Bevacizumab (AVASTIN) SOLN 1.25 mg  1.25 mg Intravitreal  Rennis Chris, MD   1.25 mg at 01/07/18 1429   Bevacizumab (AVASTIN) SOLN 1.25 mg  1.25 mg Intravitreal  Rennis Chris, MD   1.25 mg at 02/19/18 1356   Bevacizumab (AVASTIN) SOLN 1.25 mg  1.25 mg Intravitreal  Rennis Chris, MD   1.25 mg at 03/19/18 1347    Physical Findings: The patient is in no acute distress. Patient  is alert and oriented.  height is 5\' 10"  (1.778 m) and weight is 132 lb 12.8 oz (60.2 kg). Her temperature is 97.4 F (36.3 C) (abnormal). Her blood pressure is 101/89 and her pulse is 75. Her respiration is 20 and oxygen saturation is 95%. Marland Kitchen    HEENT: tongue and oropharnyx are clear with no concerning lesions.  Neck: No palpable cervical, supraclavicular lymphadenopathy Cardiovascular: RRR Pulmonology: CTA  Lab Findings: Lab Results  Component Value Date   WBC 8.3 03/28/2023   HGB 13.7 03/28/2023   HCT 39.9 03/28/2023   MCV 93.0 03/28/2023   PLT 344 03/28/2023    @LASTCHEMISTRY @  Radiographic Findings: CT Chest Wo Contrast  Result Date: 04/12/2023 CLINICAL DATA:  Right upper lobe non-small cell lung cancer (NSCLC), monitor. Additional history of breast and colon cancer. * Tracking Code: BO * EXAM: CT CHEST WITHOUT CONTRAST TECHNIQUE: Multidetector CT imaging of the chest was performed following the standard protocol without IV contrast. RADIATION DOSE REDUCTION: This exam was performed according to the departmental dose-optimization program which includes automated exposure control, adjustment of the mA and/or kV according to patient size and/or use of iterative reconstruction technique. COMPARISON:  03/14/2023 chest CT angiogram. 11/06/2022 PET-CT. 10/17/2022 chest CT. FINDINGS:  Cardiovascular: Normal heart size. No significant pericardial effusion/thickening. Three-vessel coronary atherosclerosis. Atherosclerotic thoracic aorta with dilated 4.0 cm ascending thoracic aorta. Normal caliber pulmonary arteries. Mediastinum/Nodes: Thyroid either surgically absent or atrophic. Unremarkable esophagus. Surgical clips again noted in the right axilla. No axillary adenopathy. No pathologically enlarged mediastinal or discrete hilar nodes on these noncontrast images. Lungs/Pleura: No pneumothorax. No pleural effusion. Indistinct nodular focus of consolidation in the medial left lower lobe measures 2.9 x 2.8 cm (series 8/image 100) with surrounding patchy ground-glass opacity, new from recent 03/14/2023 chest CT angiogram study. Anterior right upper lobe solid 1.6 x 1.1 cm pulmonary nodule (series 8/image 38), mildly decreased from 1.9 x 1.3 cm on 03/14/2023 CT and 1.8 x 1.2 cm on 10/17/2022 CT. Several small indistinct pulmonary nodules at the peripheral lower lobes bilaterally, for example 0.6 cm in the anterior right lower lobe (series 8/image 141) and 0.4 cm in the peripheral basilar left lower lobe (series 8/image 146), all new from recent 03/14/2023 CT. Sub solid 1.1 cm superior segment left lower lobe pulmonary nodule along the major fissure (series 8/image 44), stable since 10/17/2022 CT using similar measurement technique. Left upper lobe 0.9 cm ground-glass nodule (series 8/image 68) is stable from 10/17/2022 CT. Upper abdomen: No acute abnormality. Musculoskeletal: No aggressive appearing focal osseous lesions. Soft tissue anchors in the right humeral head. Moderate thoracolumbar spondylosis. Stable exaggerated thoracic kyphosis. IMPRESSION: 1. Anterior right upper lobe solid 1.6 cm pulmonary nodule, mildly decreased. 2. Nodular focus of consolidation in the medial left lower lobe and several indistinct subcentimeter peripheral lower lobe pulmonary nodules, all new from recent 03/14/2023  chest CT angiogram study, favoring infectious/inflammatory etiology. Close chest CT follow-up advised in 3 months. 3. No thoracic adenopathy. 4. Chronic superior segment left lower lobe subsolid pulmonary nodule and small ground-glass left upper lobe pulmonary nodule are stable. 5. Three-vessel coronary atherosclerosis. 6.  Aortic Atherosclerosis (ICD10-I70.0). Electronically Signed   By: Delbert Phenix M.D.   On: 04/12/2023 11:01   DG Chest 2 View  Result Date: 03/28/2023 CLINICAL DATA:  Shortness of breath for a week EXAM: CHEST - 2 VIEW COMPARISON:  X-ray and CT angiogram 03/14/2023 FINDINGS: Hyperinflation. Enlarged cardiopericardial silhouette. Calcified aorta. No consolidation, pneumothorax or effusion. No edema. Overlapping cardiac leads.  Surgical clips in the right axillary region. Osteopenia. Degenerative changes known right upper lobe lesion is not well seen on this portable rotated x-ray. Film is rotated to the right. IMPRESSION: Hyperinflation. Enlarged cardiopericardial silhouette. Calcified aorta. Chronic changes. Electronically Signed   By: Karen Kays M.D.   On: 03/28/2023 14:36    Impression/Plan:   Patient is recovering well from her radiation therapy. Her most recent CT scan shows a favorable response to the radiation. We will continue to closely monitor her.   She is having pain throughout her chest. Patient was concerned this was indicative of tumor metastasis. Her CT scan shows no concerning findings of disease progression. This is more likely a musculoskeletal cause. She has been coughing a lot since having COVID. She sees her PCP tomorrow and will follow-up with her for this issue.   Repeat CT of the chest in 3 months with follow-up appointment to review. Patient knows to call with any questions or concerns in the meantime.   On date of service, in total, I spent 20 minutes on this encounter. Patient was seen in person. Note signed after encounter date; minutes pertain to date of  service, only.  _____________________________________   Joyice Faster, PA-C   Lonie Peak, MD

## 2023-04-18 ENCOUNTER — Other Ambulatory Visit: Payer: Self-pay | Admitting: Radiology

## 2023-04-18 ENCOUNTER — Encounter: Payer: Self-pay | Admitting: Radiation Oncology

## 2023-04-18 DIAGNOSIS — J449 Chronic obstructive pulmonary disease, unspecified: Secondary | ICD-10-CM | POA: Diagnosis not present

## 2023-04-18 DIAGNOSIS — M419 Scoliosis, unspecified: Secondary | ICD-10-CM | POA: Diagnosis not present

## 2023-04-18 DIAGNOSIS — C3411 Malignant neoplasm of upper lobe, right bronchus or lung: Secondary | ICD-10-CM

## 2023-04-19 ENCOUNTER — Ambulatory Visit
Admission: RE | Admit: 2023-04-19 | Discharge: 2023-04-19 | Disposition: A | Discharge status: Home / Self Care | Payer: Medicare Other | Source: Ambulatory Visit | Attending: Hematology | Admitting: Hematology

## 2023-04-19 DIAGNOSIS — Z1231 Encounter for screening mammogram for malignant neoplasm of breast: Secondary | ICD-10-CM | POA: Diagnosis not present

## 2023-05-08 DIAGNOSIS — E46 Unspecified protein-calorie malnutrition: Secondary | ICD-10-CM | POA: Diagnosis not present

## 2023-05-08 DIAGNOSIS — J449 Chronic obstructive pulmonary disease, unspecified: Secondary | ICD-10-CM | POA: Diagnosis not present

## 2023-05-08 DIAGNOSIS — Z742 Need for assistance at home and no other household member able to render care: Secondary | ICD-10-CM | POA: Diagnosis not present

## 2023-05-08 DIAGNOSIS — J441 Chronic obstructive pulmonary disease with (acute) exacerbation: Secondary | ICD-10-CM | POA: Diagnosis not present

## 2023-05-14 DIAGNOSIS — I2510 Atherosclerotic heart disease of native coronary artery without angina pectoris: Secondary | ICD-10-CM | POA: Diagnosis not present

## 2023-05-14 DIAGNOSIS — Z7982 Long term (current) use of aspirin: Secondary | ICD-10-CM | POA: Diagnosis not present

## 2023-05-14 DIAGNOSIS — H3530 Unspecified macular degeneration: Secondary | ICD-10-CM | POA: Diagnosis not present

## 2023-05-14 DIAGNOSIS — I509 Heart failure, unspecified: Secondary | ICD-10-CM | POA: Diagnosis not present

## 2023-05-14 DIAGNOSIS — N3281 Overactive bladder: Secondary | ICD-10-CM | POA: Diagnosis not present

## 2023-05-14 DIAGNOSIS — Z79891 Long term (current) use of opiate analgesic: Secondary | ICD-10-CM | POA: Diagnosis not present

## 2023-05-14 DIAGNOSIS — Z9981 Dependence on supplemental oxygen: Secondary | ICD-10-CM | POA: Diagnosis not present

## 2023-05-14 DIAGNOSIS — J441 Chronic obstructive pulmonary disease with (acute) exacerbation: Secondary | ICD-10-CM | POA: Diagnosis not present

## 2023-05-14 DIAGNOSIS — E785 Hyperlipidemia, unspecified: Secondary | ICD-10-CM | POA: Diagnosis not present

## 2023-05-14 DIAGNOSIS — Z556 Problems related to health literacy: Secondary | ICD-10-CM | POA: Diagnosis not present

## 2023-05-14 DIAGNOSIS — E039 Hypothyroidism, unspecified: Secondary | ICD-10-CM | POA: Diagnosis not present

## 2023-05-14 DIAGNOSIS — M810 Age-related osteoporosis without current pathological fracture: Secondary | ICD-10-CM | POA: Diagnosis not present

## 2023-05-14 DIAGNOSIS — F419 Anxiety disorder, unspecified: Secondary | ICD-10-CM | POA: Diagnosis not present

## 2023-05-14 DIAGNOSIS — Z87891 Personal history of nicotine dependence: Secondary | ICD-10-CM | POA: Diagnosis not present

## 2023-05-14 DIAGNOSIS — E441 Mild protein-calorie malnutrition: Secondary | ICD-10-CM | POA: Diagnosis not present

## 2023-05-14 DIAGNOSIS — I110 Hypertensive heart disease with heart failure: Secondary | ICD-10-CM | POA: Diagnosis not present

## 2023-05-14 DIAGNOSIS — M51369 Other intervertebral disc degeneration, lumbar region without mention of lumbar back pain or lower extremity pain: Secondary | ICD-10-CM | POA: Diagnosis not present

## 2023-05-17 DIAGNOSIS — Z9981 Dependence on supplemental oxygen: Secondary | ICD-10-CM | POA: Diagnosis not present

## 2023-05-17 DIAGNOSIS — C50919 Malignant neoplasm of unspecified site of unspecified female breast: Secondary | ICD-10-CM | POA: Diagnosis not present

## 2023-05-17 DIAGNOSIS — H04123 Dry eye syndrome of bilateral lacrimal glands: Secondary | ICD-10-CM | POA: Diagnosis not present

## 2023-05-21 DIAGNOSIS — I2510 Atherosclerotic heart disease of native coronary artery without angina pectoris: Secondary | ICD-10-CM | POA: Diagnosis not present

## 2023-05-21 DIAGNOSIS — Z87891 Personal history of nicotine dependence: Secondary | ICD-10-CM | POA: Diagnosis not present

## 2023-05-21 DIAGNOSIS — J441 Chronic obstructive pulmonary disease with (acute) exacerbation: Secondary | ICD-10-CM | POA: Diagnosis not present

## 2023-05-21 DIAGNOSIS — E039 Hypothyroidism, unspecified: Secondary | ICD-10-CM | POA: Diagnosis not present

## 2023-05-21 DIAGNOSIS — E785 Hyperlipidemia, unspecified: Secondary | ICD-10-CM | POA: Diagnosis not present

## 2023-05-21 DIAGNOSIS — Z9981 Dependence on supplemental oxygen: Secondary | ICD-10-CM | POA: Diagnosis not present

## 2023-05-21 DIAGNOSIS — M810 Age-related osteoporosis without current pathological fracture: Secondary | ICD-10-CM | POA: Diagnosis not present

## 2023-05-21 DIAGNOSIS — M51369 Other intervertebral disc degeneration, lumbar region without mention of lumbar back pain or lower extremity pain: Secondary | ICD-10-CM | POA: Diagnosis not present

## 2023-05-21 DIAGNOSIS — I509 Heart failure, unspecified: Secondary | ICD-10-CM | POA: Diagnosis not present

## 2023-05-21 DIAGNOSIS — F419 Anxiety disorder, unspecified: Secondary | ICD-10-CM | POA: Diagnosis not present

## 2023-05-21 DIAGNOSIS — H3530 Unspecified macular degeneration: Secondary | ICD-10-CM | POA: Diagnosis not present

## 2023-05-21 DIAGNOSIS — Z79891 Long term (current) use of opiate analgesic: Secondary | ICD-10-CM | POA: Diagnosis not present

## 2023-05-21 DIAGNOSIS — I110 Hypertensive heart disease with heart failure: Secondary | ICD-10-CM | POA: Diagnosis not present

## 2023-05-21 DIAGNOSIS — E441 Mild protein-calorie malnutrition: Secondary | ICD-10-CM | POA: Diagnosis not present

## 2023-05-21 DIAGNOSIS — Z556 Problems related to health literacy: Secondary | ICD-10-CM | POA: Diagnosis not present

## 2023-05-21 DIAGNOSIS — N3281 Overactive bladder: Secondary | ICD-10-CM | POA: Diagnosis not present

## 2023-05-23 DIAGNOSIS — I2510 Atherosclerotic heart disease of native coronary artery without angina pectoris: Secondary | ICD-10-CM | POA: Diagnosis not present

## 2023-05-23 DIAGNOSIS — N3281 Overactive bladder: Secondary | ICD-10-CM | POA: Diagnosis not present

## 2023-05-23 DIAGNOSIS — Z79891 Long term (current) use of opiate analgesic: Secondary | ICD-10-CM | POA: Diagnosis not present

## 2023-05-23 DIAGNOSIS — M810 Age-related osteoporosis without current pathological fracture: Secondary | ICD-10-CM | POA: Diagnosis not present

## 2023-05-23 DIAGNOSIS — Z87891 Personal history of nicotine dependence: Secondary | ICD-10-CM | POA: Diagnosis not present

## 2023-05-23 DIAGNOSIS — I110 Hypertensive heart disease with heart failure: Secondary | ICD-10-CM | POA: Diagnosis not present

## 2023-05-23 DIAGNOSIS — E039 Hypothyroidism, unspecified: Secondary | ICD-10-CM | POA: Diagnosis not present

## 2023-05-23 DIAGNOSIS — I509 Heart failure, unspecified: Secondary | ICD-10-CM | POA: Diagnosis not present

## 2023-05-23 DIAGNOSIS — Z556 Problems related to health literacy: Secondary | ICD-10-CM | POA: Diagnosis not present

## 2023-05-23 DIAGNOSIS — Z9981 Dependence on supplemental oxygen: Secondary | ICD-10-CM | POA: Diagnosis not present

## 2023-05-23 DIAGNOSIS — E441 Mild protein-calorie malnutrition: Secondary | ICD-10-CM | POA: Diagnosis not present

## 2023-05-23 DIAGNOSIS — H3530 Unspecified macular degeneration: Secondary | ICD-10-CM | POA: Diagnosis not present

## 2023-05-23 DIAGNOSIS — J441 Chronic obstructive pulmonary disease with (acute) exacerbation: Secondary | ICD-10-CM | POA: Diagnosis not present

## 2023-05-23 DIAGNOSIS — F419 Anxiety disorder, unspecified: Secondary | ICD-10-CM | POA: Diagnosis not present

## 2023-05-23 DIAGNOSIS — E785 Hyperlipidemia, unspecified: Secondary | ICD-10-CM | POA: Diagnosis not present

## 2023-05-23 DIAGNOSIS — M51369 Other intervertebral disc degeneration, lumbar region without mention of lumbar back pain or lower extremity pain: Secondary | ICD-10-CM | POA: Diagnosis not present

## 2023-05-28 DIAGNOSIS — Z9981 Dependence on supplemental oxygen: Secondary | ICD-10-CM | POA: Diagnosis not present

## 2023-05-28 DIAGNOSIS — E441 Mild protein-calorie malnutrition: Secondary | ICD-10-CM | POA: Diagnosis not present

## 2023-05-28 DIAGNOSIS — N3281 Overactive bladder: Secondary | ICD-10-CM | POA: Diagnosis not present

## 2023-05-28 DIAGNOSIS — H3530 Unspecified macular degeneration: Secondary | ICD-10-CM | POA: Diagnosis not present

## 2023-05-28 DIAGNOSIS — I509 Heart failure, unspecified: Secondary | ICD-10-CM | POA: Diagnosis not present

## 2023-05-28 DIAGNOSIS — I110 Hypertensive heart disease with heart failure: Secondary | ICD-10-CM | POA: Diagnosis not present

## 2023-05-28 DIAGNOSIS — E785 Hyperlipidemia, unspecified: Secondary | ICD-10-CM | POA: Diagnosis not present

## 2023-05-28 DIAGNOSIS — Z556 Problems related to health literacy: Secondary | ICD-10-CM | POA: Diagnosis not present

## 2023-05-28 DIAGNOSIS — I2510 Atherosclerotic heart disease of native coronary artery without angina pectoris: Secondary | ICD-10-CM | POA: Diagnosis not present

## 2023-05-28 DIAGNOSIS — M51369 Other intervertebral disc degeneration, lumbar region without mention of lumbar back pain or lower extremity pain: Secondary | ICD-10-CM | POA: Diagnosis not present

## 2023-05-28 DIAGNOSIS — Z87891 Personal history of nicotine dependence: Secondary | ICD-10-CM | POA: Diagnosis not present

## 2023-05-28 DIAGNOSIS — F419 Anxiety disorder, unspecified: Secondary | ICD-10-CM | POA: Diagnosis not present

## 2023-05-28 DIAGNOSIS — Z79891 Long term (current) use of opiate analgesic: Secondary | ICD-10-CM | POA: Diagnosis not present

## 2023-05-28 DIAGNOSIS — J441 Chronic obstructive pulmonary disease with (acute) exacerbation: Secondary | ICD-10-CM | POA: Diagnosis not present

## 2023-05-28 DIAGNOSIS — E039 Hypothyroidism, unspecified: Secondary | ICD-10-CM | POA: Diagnosis not present

## 2023-05-28 DIAGNOSIS — M810 Age-related osteoporosis without current pathological fracture: Secondary | ICD-10-CM | POA: Diagnosis not present

## 2023-05-30 DIAGNOSIS — Z79891 Long term (current) use of opiate analgesic: Secondary | ICD-10-CM | POA: Diagnosis not present

## 2023-05-30 DIAGNOSIS — Z87891 Personal history of nicotine dependence: Secondary | ICD-10-CM | POA: Diagnosis not present

## 2023-05-30 DIAGNOSIS — H3530 Unspecified macular degeneration: Secondary | ICD-10-CM | POA: Diagnosis not present

## 2023-05-30 DIAGNOSIS — I2510 Atherosclerotic heart disease of native coronary artery without angina pectoris: Secondary | ICD-10-CM | POA: Diagnosis not present

## 2023-05-30 DIAGNOSIS — Z556 Problems related to health literacy: Secondary | ICD-10-CM | POA: Diagnosis not present

## 2023-05-30 DIAGNOSIS — E039 Hypothyroidism, unspecified: Secondary | ICD-10-CM | POA: Diagnosis not present

## 2023-05-30 DIAGNOSIS — N3281 Overactive bladder: Secondary | ICD-10-CM | POA: Diagnosis not present

## 2023-05-30 DIAGNOSIS — M810 Age-related osteoporosis without current pathological fracture: Secondary | ICD-10-CM | POA: Diagnosis not present

## 2023-05-30 DIAGNOSIS — I509 Heart failure, unspecified: Secondary | ICD-10-CM | POA: Diagnosis not present

## 2023-05-30 DIAGNOSIS — F419 Anxiety disorder, unspecified: Secondary | ICD-10-CM | POA: Diagnosis not present

## 2023-05-30 DIAGNOSIS — Z9981 Dependence on supplemental oxygen: Secondary | ICD-10-CM | POA: Diagnosis not present

## 2023-05-30 DIAGNOSIS — M51369 Other intervertebral disc degeneration, lumbar region without mention of lumbar back pain or lower extremity pain: Secondary | ICD-10-CM | POA: Diagnosis not present

## 2023-05-30 DIAGNOSIS — E441 Mild protein-calorie malnutrition: Secondary | ICD-10-CM | POA: Diagnosis not present

## 2023-05-30 DIAGNOSIS — I110 Hypertensive heart disease with heart failure: Secondary | ICD-10-CM | POA: Diagnosis not present

## 2023-05-30 DIAGNOSIS — J441 Chronic obstructive pulmonary disease with (acute) exacerbation: Secondary | ICD-10-CM | POA: Diagnosis not present

## 2023-05-30 DIAGNOSIS — E785 Hyperlipidemia, unspecified: Secondary | ICD-10-CM | POA: Diagnosis not present

## 2023-06-01 DIAGNOSIS — H3530 Unspecified macular degeneration: Secondary | ICD-10-CM | POA: Diagnosis not present

## 2023-06-01 DIAGNOSIS — N3281 Overactive bladder: Secondary | ICD-10-CM | POA: Diagnosis not present

## 2023-06-01 DIAGNOSIS — J441 Chronic obstructive pulmonary disease with (acute) exacerbation: Secondary | ICD-10-CM | POA: Diagnosis not present

## 2023-06-01 DIAGNOSIS — F419 Anxiety disorder, unspecified: Secondary | ICD-10-CM | POA: Diagnosis not present

## 2023-06-01 DIAGNOSIS — Z87891 Personal history of nicotine dependence: Secondary | ICD-10-CM | POA: Diagnosis not present

## 2023-06-01 DIAGNOSIS — I509 Heart failure, unspecified: Secondary | ICD-10-CM | POA: Diagnosis not present

## 2023-06-01 DIAGNOSIS — Z9981 Dependence on supplemental oxygen: Secondary | ICD-10-CM | POA: Diagnosis not present

## 2023-06-01 DIAGNOSIS — E785 Hyperlipidemia, unspecified: Secondary | ICD-10-CM | POA: Diagnosis not present

## 2023-06-01 DIAGNOSIS — Z556 Problems related to health literacy: Secondary | ICD-10-CM | POA: Diagnosis not present

## 2023-06-01 DIAGNOSIS — I110 Hypertensive heart disease with heart failure: Secondary | ICD-10-CM | POA: Diagnosis not present

## 2023-06-01 DIAGNOSIS — Z79891 Long term (current) use of opiate analgesic: Secondary | ICD-10-CM | POA: Diagnosis not present

## 2023-06-01 DIAGNOSIS — I2510 Atherosclerotic heart disease of native coronary artery without angina pectoris: Secondary | ICD-10-CM | POA: Diagnosis not present

## 2023-06-01 DIAGNOSIS — E039 Hypothyroidism, unspecified: Secondary | ICD-10-CM | POA: Diagnosis not present

## 2023-06-01 DIAGNOSIS — E441 Mild protein-calorie malnutrition: Secondary | ICD-10-CM | POA: Diagnosis not present

## 2023-06-01 DIAGNOSIS — M810 Age-related osteoporosis without current pathological fracture: Secondary | ICD-10-CM | POA: Diagnosis not present

## 2023-06-01 DIAGNOSIS — M51369 Other intervertebral disc degeneration, lumbar region without mention of lumbar back pain or lower extremity pain: Secondary | ICD-10-CM | POA: Diagnosis not present

## 2023-06-04 DIAGNOSIS — E785 Hyperlipidemia, unspecified: Secondary | ICD-10-CM | POA: Diagnosis not present

## 2023-06-04 DIAGNOSIS — Z556 Problems related to health literacy: Secondary | ICD-10-CM | POA: Diagnosis not present

## 2023-06-04 DIAGNOSIS — Z87891 Personal history of nicotine dependence: Secondary | ICD-10-CM | POA: Diagnosis not present

## 2023-06-04 DIAGNOSIS — I2510 Atherosclerotic heart disease of native coronary artery without angina pectoris: Secondary | ICD-10-CM | POA: Diagnosis not present

## 2023-06-04 DIAGNOSIS — M810 Age-related osteoporosis without current pathological fracture: Secondary | ICD-10-CM | POA: Diagnosis not present

## 2023-06-04 DIAGNOSIS — F419 Anxiety disorder, unspecified: Secondary | ICD-10-CM | POA: Diagnosis not present

## 2023-06-04 DIAGNOSIS — I509 Heart failure, unspecified: Secondary | ICD-10-CM | POA: Diagnosis not present

## 2023-06-04 DIAGNOSIS — M51369 Other intervertebral disc degeneration, lumbar region without mention of lumbar back pain or lower extremity pain: Secondary | ICD-10-CM | POA: Diagnosis not present

## 2023-06-04 DIAGNOSIS — I110 Hypertensive heart disease with heart failure: Secondary | ICD-10-CM | POA: Diagnosis not present

## 2023-06-04 DIAGNOSIS — N3281 Overactive bladder: Secondary | ICD-10-CM | POA: Diagnosis not present

## 2023-06-04 DIAGNOSIS — Z9981 Dependence on supplemental oxygen: Secondary | ICD-10-CM | POA: Diagnosis not present

## 2023-06-04 DIAGNOSIS — E039 Hypothyroidism, unspecified: Secondary | ICD-10-CM | POA: Diagnosis not present

## 2023-06-04 DIAGNOSIS — Z79891 Long term (current) use of opiate analgesic: Secondary | ICD-10-CM | POA: Diagnosis not present

## 2023-06-04 DIAGNOSIS — E441 Mild protein-calorie malnutrition: Secondary | ICD-10-CM | POA: Diagnosis not present

## 2023-06-04 DIAGNOSIS — H3530 Unspecified macular degeneration: Secondary | ICD-10-CM | POA: Diagnosis not present

## 2023-06-04 DIAGNOSIS — J441 Chronic obstructive pulmonary disease with (acute) exacerbation: Secondary | ICD-10-CM | POA: Diagnosis not present

## 2023-06-06 DIAGNOSIS — I509 Heart failure, unspecified: Secondary | ICD-10-CM | POA: Diagnosis not present

## 2023-06-06 DIAGNOSIS — I2510 Atherosclerotic heart disease of native coronary artery without angina pectoris: Secondary | ICD-10-CM | POA: Diagnosis not present

## 2023-06-06 DIAGNOSIS — H3530 Unspecified macular degeneration: Secondary | ICD-10-CM | POA: Diagnosis not present

## 2023-06-06 DIAGNOSIS — Z87891 Personal history of nicotine dependence: Secondary | ICD-10-CM | POA: Diagnosis not present

## 2023-06-06 DIAGNOSIS — M51369 Other intervertebral disc degeneration, lumbar region without mention of lumbar back pain or lower extremity pain: Secondary | ICD-10-CM | POA: Diagnosis not present

## 2023-06-06 DIAGNOSIS — F419 Anxiety disorder, unspecified: Secondary | ICD-10-CM | POA: Diagnosis not present

## 2023-06-06 DIAGNOSIS — I110 Hypertensive heart disease with heart failure: Secondary | ICD-10-CM | POA: Diagnosis not present

## 2023-06-06 DIAGNOSIS — N3281 Overactive bladder: Secondary | ICD-10-CM | POA: Diagnosis not present

## 2023-06-06 DIAGNOSIS — E039 Hypothyroidism, unspecified: Secondary | ICD-10-CM | POA: Diagnosis not present

## 2023-06-06 DIAGNOSIS — E441 Mild protein-calorie malnutrition: Secondary | ICD-10-CM | POA: Diagnosis not present

## 2023-06-06 DIAGNOSIS — M810 Age-related osteoporosis without current pathological fracture: Secondary | ICD-10-CM | POA: Diagnosis not present

## 2023-06-06 DIAGNOSIS — Z9981 Dependence on supplemental oxygen: Secondary | ICD-10-CM | POA: Diagnosis not present

## 2023-06-06 DIAGNOSIS — Z556 Problems related to health literacy: Secondary | ICD-10-CM | POA: Diagnosis not present

## 2023-06-06 DIAGNOSIS — Z79891 Long term (current) use of opiate analgesic: Secondary | ICD-10-CM | POA: Diagnosis not present

## 2023-06-06 DIAGNOSIS — J441 Chronic obstructive pulmonary disease with (acute) exacerbation: Secondary | ICD-10-CM | POA: Diagnosis not present

## 2023-06-06 DIAGNOSIS — E785 Hyperlipidemia, unspecified: Secondary | ICD-10-CM | POA: Diagnosis not present

## 2023-06-07 DIAGNOSIS — I110 Hypertensive heart disease with heart failure: Secondary | ICD-10-CM | POA: Diagnosis not present

## 2023-06-07 DIAGNOSIS — H3530 Unspecified macular degeneration: Secondary | ICD-10-CM | POA: Diagnosis not present

## 2023-06-07 DIAGNOSIS — E039 Hypothyroidism, unspecified: Secondary | ICD-10-CM | POA: Diagnosis not present

## 2023-06-07 DIAGNOSIS — J441 Chronic obstructive pulmonary disease with (acute) exacerbation: Secondary | ICD-10-CM | POA: Diagnosis not present

## 2023-06-07 DIAGNOSIS — F419 Anxiety disorder, unspecified: Secondary | ICD-10-CM | POA: Diagnosis not present

## 2023-06-07 DIAGNOSIS — E441 Mild protein-calorie malnutrition: Secondary | ICD-10-CM | POA: Diagnosis not present

## 2023-06-07 DIAGNOSIS — E785 Hyperlipidemia, unspecified: Secondary | ICD-10-CM | POA: Diagnosis not present

## 2023-06-07 DIAGNOSIS — M810 Age-related osteoporosis without current pathological fracture: Secondary | ICD-10-CM | POA: Diagnosis not present

## 2023-06-07 DIAGNOSIS — N3281 Overactive bladder: Secondary | ICD-10-CM | POA: Diagnosis not present

## 2023-06-07 DIAGNOSIS — Z87891 Personal history of nicotine dependence: Secondary | ICD-10-CM | POA: Diagnosis not present

## 2023-06-07 DIAGNOSIS — I2510 Atherosclerotic heart disease of native coronary artery without angina pectoris: Secondary | ICD-10-CM | POA: Diagnosis not present

## 2023-06-07 DIAGNOSIS — Z556 Problems related to health literacy: Secondary | ICD-10-CM | POA: Diagnosis not present

## 2023-06-07 DIAGNOSIS — Z79891 Long term (current) use of opiate analgesic: Secondary | ICD-10-CM | POA: Diagnosis not present

## 2023-06-07 DIAGNOSIS — M51369 Other intervertebral disc degeneration, lumbar region without mention of lumbar back pain or lower extremity pain: Secondary | ICD-10-CM | POA: Diagnosis not present

## 2023-06-07 DIAGNOSIS — Z9981 Dependence on supplemental oxygen: Secondary | ICD-10-CM | POA: Diagnosis not present

## 2023-06-07 DIAGNOSIS — J449 Chronic obstructive pulmonary disease, unspecified: Secondary | ICD-10-CM | POA: Diagnosis not present

## 2023-06-07 DIAGNOSIS — I509 Heart failure, unspecified: Secondary | ICD-10-CM | POA: Diagnosis not present

## 2023-06-12 DIAGNOSIS — N3281 Overactive bladder: Secondary | ICD-10-CM | POA: Diagnosis not present

## 2023-06-12 DIAGNOSIS — I509 Heart failure, unspecified: Secondary | ICD-10-CM | POA: Diagnosis not present

## 2023-06-12 DIAGNOSIS — H3530 Unspecified macular degeneration: Secondary | ICD-10-CM | POA: Diagnosis not present

## 2023-06-12 DIAGNOSIS — I110 Hypertensive heart disease with heart failure: Secondary | ICD-10-CM | POA: Diagnosis not present

## 2023-06-12 DIAGNOSIS — E785 Hyperlipidemia, unspecified: Secondary | ICD-10-CM | POA: Diagnosis not present

## 2023-06-12 DIAGNOSIS — Z87891 Personal history of nicotine dependence: Secondary | ICD-10-CM | POA: Diagnosis not present

## 2023-06-12 DIAGNOSIS — E441 Mild protein-calorie malnutrition: Secondary | ICD-10-CM | POA: Diagnosis not present

## 2023-06-12 DIAGNOSIS — Z79891 Long term (current) use of opiate analgesic: Secondary | ICD-10-CM | POA: Diagnosis not present

## 2023-06-12 DIAGNOSIS — I2510 Atherosclerotic heart disease of native coronary artery without angina pectoris: Secondary | ICD-10-CM | POA: Diagnosis not present

## 2023-06-12 DIAGNOSIS — J441 Chronic obstructive pulmonary disease with (acute) exacerbation: Secondary | ICD-10-CM | POA: Diagnosis not present

## 2023-06-12 DIAGNOSIS — E039 Hypothyroidism, unspecified: Secondary | ICD-10-CM | POA: Diagnosis not present

## 2023-06-12 DIAGNOSIS — M810 Age-related osteoporosis without current pathological fracture: Secondary | ICD-10-CM | POA: Diagnosis not present

## 2023-06-12 DIAGNOSIS — M51369 Other intervertebral disc degeneration, lumbar region without mention of lumbar back pain or lower extremity pain: Secondary | ICD-10-CM | POA: Diagnosis not present

## 2023-06-12 DIAGNOSIS — Z9981 Dependence on supplemental oxygen: Secondary | ICD-10-CM | POA: Diagnosis not present

## 2023-06-12 DIAGNOSIS — Z556 Problems related to health literacy: Secondary | ICD-10-CM | POA: Diagnosis not present

## 2023-06-12 DIAGNOSIS — F419 Anxiety disorder, unspecified: Secondary | ICD-10-CM | POA: Diagnosis not present

## 2023-06-13 DIAGNOSIS — I2510 Atherosclerotic heart disease of native coronary artery without angina pectoris: Secondary | ICD-10-CM | POA: Diagnosis not present

## 2023-06-13 DIAGNOSIS — H3530 Unspecified macular degeneration: Secondary | ICD-10-CM | POA: Diagnosis not present

## 2023-06-13 DIAGNOSIS — F419 Anxiety disorder, unspecified: Secondary | ICD-10-CM | POA: Diagnosis not present

## 2023-06-13 DIAGNOSIS — Z87891 Personal history of nicotine dependence: Secondary | ICD-10-CM | POA: Diagnosis not present

## 2023-06-13 DIAGNOSIS — Z7982 Long term (current) use of aspirin: Secondary | ICD-10-CM | POA: Diagnosis not present

## 2023-06-13 DIAGNOSIS — I110 Hypertensive heart disease with heart failure: Secondary | ICD-10-CM | POA: Diagnosis not present

## 2023-06-13 DIAGNOSIS — E039 Hypothyroidism, unspecified: Secondary | ICD-10-CM | POA: Diagnosis not present

## 2023-06-13 DIAGNOSIS — M810 Age-related osteoporosis without current pathological fracture: Secondary | ICD-10-CM | POA: Diagnosis not present

## 2023-06-13 DIAGNOSIS — M51369 Other intervertebral disc degeneration, lumbar region without mention of lumbar back pain or lower extremity pain: Secondary | ICD-10-CM | POA: Diagnosis not present

## 2023-06-13 DIAGNOSIS — I509 Heart failure, unspecified: Secondary | ICD-10-CM | POA: Diagnosis not present

## 2023-06-13 DIAGNOSIS — N3281 Overactive bladder: Secondary | ICD-10-CM | POA: Diagnosis not present

## 2023-06-13 DIAGNOSIS — E785 Hyperlipidemia, unspecified: Secondary | ICD-10-CM | POA: Diagnosis not present

## 2023-06-13 DIAGNOSIS — J441 Chronic obstructive pulmonary disease with (acute) exacerbation: Secondary | ICD-10-CM | POA: Diagnosis not present

## 2023-06-13 DIAGNOSIS — Z9981 Dependence on supplemental oxygen: Secondary | ICD-10-CM | POA: Diagnosis not present

## 2023-06-13 DIAGNOSIS — E441 Mild protein-calorie malnutrition: Secondary | ICD-10-CM | POA: Diagnosis not present

## 2023-06-13 DIAGNOSIS — Z79891 Long term (current) use of opiate analgesic: Secondary | ICD-10-CM | POA: Diagnosis not present

## 2023-06-13 DIAGNOSIS — Z556 Problems related to health literacy: Secondary | ICD-10-CM | POA: Diagnosis not present

## 2023-06-14 DIAGNOSIS — I110 Hypertensive heart disease with heart failure: Secondary | ICD-10-CM | POA: Diagnosis not present

## 2023-06-14 DIAGNOSIS — N3281 Overactive bladder: Secondary | ICD-10-CM | POA: Diagnosis not present

## 2023-06-14 DIAGNOSIS — Z9981 Dependence on supplemental oxygen: Secondary | ICD-10-CM | POA: Diagnosis not present

## 2023-06-14 DIAGNOSIS — Z87891 Personal history of nicotine dependence: Secondary | ICD-10-CM | POA: Diagnosis not present

## 2023-06-14 DIAGNOSIS — E441 Mild protein-calorie malnutrition: Secondary | ICD-10-CM | POA: Diagnosis not present

## 2023-06-14 DIAGNOSIS — Z79891 Long term (current) use of opiate analgesic: Secondary | ICD-10-CM | POA: Diagnosis not present

## 2023-06-14 DIAGNOSIS — F419 Anxiety disorder, unspecified: Secondary | ICD-10-CM | POA: Diagnosis not present

## 2023-06-14 DIAGNOSIS — I2510 Atherosclerotic heart disease of native coronary artery without angina pectoris: Secondary | ICD-10-CM | POA: Diagnosis not present

## 2023-06-14 DIAGNOSIS — E039 Hypothyroidism, unspecified: Secondary | ICD-10-CM | POA: Diagnosis not present

## 2023-06-14 DIAGNOSIS — M51369 Other intervertebral disc degeneration, lumbar region without mention of lumbar back pain or lower extremity pain: Secondary | ICD-10-CM | POA: Diagnosis not present

## 2023-06-14 DIAGNOSIS — H3530 Unspecified macular degeneration: Secondary | ICD-10-CM | POA: Diagnosis not present

## 2023-06-14 DIAGNOSIS — M810 Age-related osteoporosis without current pathological fracture: Secondary | ICD-10-CM | POA: Diagnosis not present

## 2023-06-14 DIAGNOSIS — E785 Hyperlipidemia, unspecified: Secondary | ICD-10-CM | POA: Diagnosis not present

## 2023-06-14 DIAGNOSIS — Z556 Problems related to health literacy: Secondary | ICD-10-CM | POA: Diagnosis not present

## 2023-06-14 DIAGNOSIS — I509 Heart failure, unspecified: Secondary | ICD-10-CM | POA: Diagnosis not present

## 2023-06-14 DIAGNOSIS — J441 Chronic obstructive pulmonary disease with (acute) exacerbation: Secondary | ICD-10-CM | POA: Diagnosis not present

## 2023-06-20 DIAGNOSIS — I110 Hypertensive heart disease with heart failure: Secondary | ICD-10-CM | POA: Diagnosis not present

## 2023-06-20 DIAGNOSIS — M51369 Other intervertebral disc degeneration, lumbar region without mention of lumbar back pain or lower extremity pain: Secondary | ICD-10-CM | POA: Diagnosis not present

## 2023-06-20 DIAGNOSIS — F419 Anxiety disorder, unspecified: Secondary | ICD-10-CM | POA: Diagnosis not present

## 2023-06-20 DIAGNOSIS — H3530 Unspecified macular degeneration: Secondary | ICD-10-CM | POA: Diagnosis not present

## 2023-06-20 DIAGNOSIS — J441 Chronic obstructive pulmonary disease with (acute) exacerbation: Secondary | ICD-10-CM | POA: Diagnosis not present

## 2023-06-20 DIAGNOSIS — I509 Heart failure, unspecified: Secondary | ICD-10-CM | POA: Diagnosis not present

## 2023-06-20 DIAGNOSIS — N3281 Overactive bladder: Secondary | ICD-10-CM | POA: Diagnosis not present

## 2023-06-20 DIAGNOSIS — Z556 Problems related to health literacy: Secondary | ICD-10-CM | POA: Diagnosis not present

## 2023-06-20 DIAGNOSIS — E441 Mild protein-calorie malnutrition: Secondary | ICD-10-CM | POA: Diagnosis not present

## 2023-06-20 DIAGNOSIS — Z9981 Dependence on supplemental oxygen: Secondary | ICD-10-CM | POA: Diagnosis not present

## 2023-06-20 DIAGNOSIS — M810 Age-related osteoporosis without current pathological fracture: Secondary | ICD-10-CM | POA: Diagnosis not present

## 2023-06-20 DIAGNOSIS — E785 Hyperlipidemia, unspecified: Secondary | ICD-10-CM | POA: Diagnosis not present

## 2023-06-20 DIAGNOSIS — Z79891 Long term (current) use of opiate analgesic: Secondary | ICD-10-CM | POA: Diagnosis not present

## 2023-06-20 DIAGNOSIS — Z87891 Personal history of nicotine dependence: Secondary | ICD-10-CM | POA: Diagnosis not present

## 2023-06-20 DIAGNOSIS — E039 Hypothyroidism, unspecified: Secondary | ICD-10-CM | POA: Diagnosis not present

## 2023-06-20 DIAGNOSIS — I2510 Atherosclerotic heart disease of native coronary artery without angina pectoris: Secondary | ICD-10-CM | POA: Diagnosis not present

## 2023-06-21 DIAGNOSIS — I509 Heart failure, unspecified: Secondary | ICD-10-CM | POA: Diagnosis not present

## 2023-06-21 DIAGNOSIS — F419 Anxiety disorder, unspecified: Secondary | ICD-10-CM | POA: Diagnosis not present

## 2023-06-21 DIAGNOSIS — I110 Hypertensive heart disease with heart failure: Secondary | ICD-10-CM | POA: Diagnosis not present

## 2023-06-21 DIAGNOSIS — M51369 Other intervertebral disc degeneration, lumbar region without mention of lumbar back pain or lower extremity pain: Secondary | ICD-10-CM | POA: Diagnosis not present

## 2023-06-21 DIAGNOSIS — I2510 Atherosclerotic heart disease of native coronary artery without angina pectoris: Secondary | ICD-10-CM | POA: Diagnosis not present

## 2023-06-21 DIAGNOSIS — E039 Hypothyroidism, unspecified: Secondary | ICD-10-CM | POA: Diagnosis not present

## 2023-06-21 DIAGNOSIS — Z87891 Personal history of nicotine dependence: Secondary | ICD-10-CM | POA: Diagnosis not present

## 2023-06-21 DIAGNOSIS — Z79891 Long term (current) use of opiate analgesic: Secondary | ICD-10-CM | POA: Diagnosis not present

## 2023-06-21 DIAGNOSIS — E785 Hyperlipidemia, unspecified: Secondary | ICD-10-CM | POA: Diagnosis not present

## 2023-06-21 DIAGNOSIS — Z556 Problems related to health literacy: Secondary | ICD-10-CM | POA: Diagnosis not present

## 2023-06-21 DIAGNOSIS — H3530 Unspecified macular degeneration: Secondary | ICD-10-CM | POA: Diagnosis not present

## 2023-06-21 DIAGNOSIS — E441 Mild protein-calorie malnutrition: Secondary | ICD-10-CM | POA: Diagnosis not present

## 2023-06-21 DIAGNOSIS — J441 Chronic obstructive pulmonary disease with (acute) exacerbation: Secondary | ICD-10-CM | POA: Diagnosis not present

## 2023-06-21 DIAGNOSIS — M810 Age-related osteoporosis without current pathological fracture: Secondary | ICD-10-CM | POA: Diagnosis not present

## 2023-06-21 DIAGNOSIS — N3281 Overactive bladder: Secondary | ICD-10-CM | POA: Diagnosis not present

## 2023-06-21 DIAGNOSIS — Z9981 Dependence on supplemental oxygen: Secondary | ICD-10-CM | POA: Diagnosis not present

## 2023-06-22 DIAGNOSIS — F419 Anxiety disorder, unspecified: Secondary | ICD-10-CM | POA: Diagnosis not present

## 2023-06-22 DIAGNOSIS — M810 Age-related osteoporosis without current pathological fracture: Secondary | ICD-10-CM | POA: Diagnosis not present

## 2023-06-22 DIAGNOSIS — N3281 Overactive bladder: Secondary | ICD-10-CM | POA: Diagnosis not present

## 2023-06-22 DIAGNOSIS — H3530 Unspecified macular degeneration: Secondary | ICD-10-CM | POA: Diagnosis not present

## 2023-06-22 DIAGNOSIS — Z556 Problems related to health literacy: Secondary | ICD-10-CM | POA: Diagnosis not present

## 2023-06-22 DIAGNOSIS — E785 Hyperlipidemia, unspecified: Secondary | ICD-10-CM | POA: Diagnosis not present

## 2023-06-22 DIAGNOSIS — Z79891 Long term (current) use of opiate analgesic: Secondary | ICD-10-CM | POA: Diagnosis not present

## 2023-06-22 DIAGNOSIS — I110 Hypertensive heart disease with heart failure: Secondary | ICD-10-CM | POA: Diagnosis not present

## 2023-06-22 DIAGNOSIS — I2510 Atherosclerotic heart disease of native coronary artery without angina pectoris: Secondary | ICD-10-CM | POA: Diagnosis not present

## 2023-06-22 DIAGNOSIS — I509 Heart failure, unspecified: Secondary | ICD-10-CM | POA: Diagnosis not present

## 2023-06-22 DIAGNOSIS — E039 Hypothyroidism, unspecified: Secondary | ICD-10-CM | POA: Diagnosis not present

## 2023-06-22 DIAGNOSIS — J441 Chronic obstructive pulmonary disease with (acute) exacerbation: Secondary | ICD-10-CM | POA: Diagnosis not present

## 2023-06-22 DIAGNOSIS — Z87891 Personal history of nicotine dependence: Secondary | ICD-10-CM | POA: Diagnosis not present

## 2023-06-22 DIAGNOSIS — Z9981 Dependence on supplemental oxygen: Secondary | ICD-10-CM | POA: Diagnosis not present

## 2023-06-22 DIAGNOSIS — M51369 Other intervertebral disc degeneration, lumbar region without mention of lumbar back pain or lower extremity pain: Secondary | ICD-10-CM | POA: Diagnosis not present

## 2023-06-22 DIAGNOSIS — E441 Mild protein-calorie malnutrition: Secondary | ICD-10-CM | POA: Diagnosis not present

## 2023-06-25 DIAGNOSIS — E785 Hyperlipidemia, unspecified: Secondary | ICD-10-CM | POA: Diagnosis not present

## 2023-06-25 DIAGNOSIS — N3281 Overactive bladder: Secondary | ICD-10-CM | POA: Diagnosis not present

## 2023-06-25 DIAGNOSIS — Z79891 Long term (current) use of opiate analgesic: Secondary | ICD-10-CM | POA: Diagnosis not present

## 2023-06-25 DIAGNOSIS — Z556 Problems related to health literacy: Secondary | ICD-10-CM | POA: Diagnosis not present

## 2023-06-25 DIAGNOSIS — Z9981 Dependence on supplemental oxygen: Secondary | ICD-10-CM | POA: Diagnosis not present

## 2023-06-25 DIAGNOSIS — E441 Mild protein-calorie malnutrition: Secondary | ICD-10-CM | POA: Diagnosis not present

## 2023-06-25 DIAGNOSIS — E039 Hypothyroidism, unspecified: Secondary | ICD-10-CM | POA: Diagnosis not present

## 2023-06-25 DIAGNOSIS — Z87891 Personal history of nicotine dependence: Secondary | ICD-10-CM | POA: Diagnosis not present

## 2023-06-25 DIAGNOSIS — J441 Chronic obstructive pulmonary disease with (acute) exacerbation: Secondary | ICD-10-CM | POA: Diagnosis not present

## 2023-06-25 DIAGNOSIS — I509 Heart failure, unspecified: Secondary | ICD-10-CM | POA: Diagnosis not present

## 2023-06-25 DIAGNOSIS — M51369 Other intervertebral disc degeneration, lumbar region without mention of lumbar back pain or lower extremity pain: Secondary | ICD-10-CM | POA: Diagnosis not present

## 2023-06-25 DIAGNOSIS — H3530 Unspecified macular degeneration: Secondary | ICD-10-CM | POA: Diagnosis not present

## 2023-06-25 DIAGNOSIS — M810 Age-related osteoporosis without current pathological fracture: Secondary | ICD-10-CM | POA: Diagnosis not present

## 2023-06-25 DIAGNOSIS — F419 Anxiety disorder, unspecified: Secondary | ICD-10-CM | POA: Diagnosis not present

## 2023-06-25 DIAGNOSIS — I2510 Atherosclerotic heart disease of native coronary artery without angina pectoris: Secondary | ICD-10-CM | POA: Diagnosis not present

## 2023-06-25 DIAGNOSIS — I110 Hypertensive heart disease with heart failure: Secondary | ICD-10-CM | POA: Diagnosis not present

## 2023-06-29 DIAGNOSIS — Z9981 Dependence on supplemental oxygen: Secondary | ICD-10-CM | POA: Diagnosis not present

## 2023-06-29 DIAGNOSIS — M51369 Other intervertebral disc degeneration, lumbar region without mention of lumbar back pain or lower extremity pain: Secondary | ICD-10-CM | POA: Diagnosis not present

## 2023-06-29 DIAGNOSIS — Z556 Problems related to health literacy: Secondary | ICD-10-CM | POA: Diagnosis not present

## 2023-06-29 DIAGNOSIS — I110 Hypertensive heart disease with heart failure: Secondary | ICD-10-CM | POA: Diagnosis not present

## 2023-06-29 DIAGNOSIS — J441 Chronic obstructive pulmonary disease with (acute) exacerbation: Secondary | ICD-10-CM | POA: Diagnosis not present

## 2023-06-29 DIAGNOSIS — E785 Hyperlipidemia, unspecified: Secondary | ICD-10-CM | POA: Diagnosis not present

## 2023-06-29 DIAGNOSIS — H3530 Unspecified macular degeneration: Secondary | ICD-10-CM | POA: Diagnosis not present

## 2023-06-29 DIAGNOSIS — N3281 Overactive bladder: Secondary | ICD-10-CM | POA: Diagnosis not present

## 2023-06-29 DIAGNOSIS — Z79891 Long term (current) use of opiate analgesic: Secondary | ICD-10-CM | POA: Diagnosis not present

## 2023-06-29 DIAGNOSIS — F419 Anxiety disorder, unspecified: Secondary | ICD-10-CM | POA: Diagnosis not present

## 2023-06-29 DIAGNOSIS — M810 Age-related osteoporosis without current pathological fracture: Secondary | ICD-10-CM | POA: Diagnosis not present

## 2023-06-29 DIAGNOSIS — I509 Heart failure, unspecified: Secondary | ICD-10-CM | POA: Diagnosis not present

## 2023-06-29 DIAGNOSIS — I2510 Atherosclerotic heart disease of native coronary artery without angina pectoris: Secondary | ICD-10-CM | POA: Diagnosis not present

## 2023-06-29 DIAGNOSIS — Z87891 Personal history of nicotine dependence: Secondary | ICD-10-CM | POA: Diagnosis not present

## 2023-06-29 DIAGNOSIS — E441 Mild protein-calorie malnutrition: Secondary | ICD-10-CM | POA: Diagnosis not present

## 2023-06-29 DIAGNOSIS — E039 Hypothyroidism, unspecified: Secondary | ICD-10-CM | POA: Diagnosis not present

## 2023-07-04 DIAGNOSIS — Z556 Problems related to health literacy: Secondary | ICD-10-CM | POA: Diagnosis not present

## 2023-07-04 DIAGNOSIS — M810 Age-related osteoporosis without current pathological fracture: Secondary | ICD-10-CM | POA: Diagnosis not present

## 2023-07-04 DIAGNOSIS — N3281 Overactive bladder: Secondary | ICD-10-CM | POA: Diagnosis not present

## 2023-07-04 DIAGNOSIS — Z87891 Personal history of nicotine dependence: Secondary | ICD-10-CM | POA: Diagnosis not present

## 2023-07-04 DIAGNOSIS — Z79891 Long term (current) use of opiate analgesic: Secondary | ICD-10-CM | POA: Diagnosis not present

## 2023-07-04 DIAGNOSIS — J441 Chronic obstructive pulmonary disease with (acute) exacerbation: Secondary | ICD-10-CM | POA: Diagnosis not present

## 2023-07-04 DIAGNOSIS — I2510 Atherosclerotic heart disease of native coronary artery without angina pectoris: Secondary | ICD-10-CM | POA: Diagnosis not present

## 2023-07-04 DIAGNOSIS — I110 Hypertensive heart disease with heart failure: Secondary | ICD-10-CM | POA: Diagnosis not present

## 2023-07-04 DIAGNOSIS — I509 Heart failure, unspecified: Secondary | ICD-10-CM | POA: Diagnosis not present

## 2023-07-04 DIAGNOSIS — E785 Hyperlipidemia, unspecified: Secondary | ICD-10-CM | POA: Diagnosis not present

## 2023-07-04 DIAGNOSIS — H3530 Unspecified macular degeneration: Secondary | ICD-10-CM | POA: Diagnosis not present

## 2023-07-04 DIAGNOSIS — M51369 Other intervertebral disc degeneration, lumbar region without mention of lumbar back pain or lower extremity pain: Secondary | ICD-10-CM | POA: Diagnosis not present

## 2023-07-04 DIAGNOSIS — E039 Hypothyroidism, unspecified: Secondary | ICD-10-CM | POA: Diagnosis not present

## 2023-07-04 DIAGNOSIS — Z9981 Dependence on supplemental oxygen: Secondary | ICD-10-CM | POA: Diagnosis not present

## 2023-07-04 DIAGNOSIS — F419 Anxiety disorder, unspecified: Secondary | ICD-10-CM | POA: Diagnosis not present

## 2023-07-04 DIAGNOSIS — E441 Mild protein-calorie malnutrition: Secondary | ICD-10-CM | POA: Diagnosis not present

## 2023-07-06 DIAGNOSIS — J441 Chronic obstructive pulmonary disease with (acute) exacerbation: Secondary | ICD-10-CM | POA: Diagnosis not present

## 2023-07-06 DIAGNOSIS — I110 Hypertensive heart disease with heart failure: Secondary | ICD-10-CM | POA: Diagnosis not present

## 2023-07-06 DIAGNOSIS — E441 Mild protein-calorie malnutrition: Secondary | ICD-10-CM | POA: Diagnosis not present

## 2023-07-06 DIAGNOSIS — H3530 Unspecified macular degeneration: Secondary | ICD-10-CM | POA: Diagnosis not present

## 2023-07-06 DIAGNOSIS — E785 Hyperlipidemia, unspecified: Secondary | ICD-10-CM | POA: Diagnosis not present

## 2023-07-06 DIAGNOSIS — Z9981 Dependence on supplemental oxygen: Secondary | ICD-10-CM | POA: Diagnosis not present

## 2023-07-06 DIAGNOSIS — I2510 Atherosclerotic heart disease of native coronary artery without angina pectoris: Secondary | ICD-10-CM | POA: Diagnosis not present

## 2023-07-06 DIAGNOSIS — F419 Anxiety disorder, unspecified: Secondary | ICD-10-CM | POA: Diagnosis not present

## 2023-07-06 DIAGNOSIS — E039 Hypothyroidism, unspecified: Secondary | ICD-10-CM | POA: Diagnosis not present

## 2023-07-06 DIAGNOSIS — I509 Heart failure, unspecified: Secondary | ICD-10-CM | POA: Diagnosis not present

## 2023-07-06 DIAGNOSIS — Z79891 Long term (current) use of opiate analgesic: Secondary | ICD-10-CM | POA: Diagnosis not present

## 2023-07-06 DIAGNOSIS — N3281 Overactive bladder: Secondary | ICD-10-CM | POA: Diagnosis not present

## 2023-07-06 DIAGNOSIS — Z556 Problems related to health literacy: Secondary | ICD-10-CM | POA: Diagnosis not present

## 2023-07-06 DIAGNOSIS — M810 Age-related osteoporosis without current pathological fracture: Secondary | ICD-10-CM | POA: Diagnosis not present

## 2023-07-06 DIAGNOSIS — Z87891 Personal history of nicotine dependence: Secondary | ICD-10-CM | POA: Diagnosis not present

## 2023-07-06 DIAGNOSIS — M51369 Other intervertebral disc degeneration, lumbar region without mention of lumbar back pain or lower extremity pain: Secondary | ICD-10-CM | POA: Diagnosis not present

## 2023-07-08 DIAGNOSIS — J449 Chronic obstructive pulmonary disease, unspecified: Secondary | ICD-10-CM | POA: Diagnosis not present

## 2023-07-09 ENCOUNTER — Telehealth: Payer: Self-pay | Admitting: *Deleted

## 2023-07-09 ENCOUNTER — Other Ambulatory Visit: Payer: Self-pay | Admitting: *Deleted

## 2023-07-09 NOTE — Telephone Encounter (Signed)
Called patient to inform of CT for 07-13-23- arrival time- 3:15 pm @ [facility], no restrictions to scan, patient to receive results from Dr. Basilio Cairo on 07-17-23  @ 2:40 pm, spoke with patient's son Barnett Applebaum and mailed appt. cards

## 2023-07-10 DIAGNOSIS — I110 Hypertensive heart disease with heart failure: Secondary | ICD-10-CM | POA: Diagnosis not present

## 2023-07-10 DIAGNOSIS — N3281 Overactive bladder: Secondary | ICD-10-CM | POA: Diagnosis not present

## 2023-07-10 DIAGNOSIS — H3530 Unspecified macular degeneration: Secondary | ICD-10-CM | POA: Diagnosis not present

## 2023-07-10 DIAGNOSIS — M810 Age-related osteoporosis without current pathological fracture: Secondary | ICD-10-CM | POA: Diagnosis not present

## 2023-07-10 DIAGNOSIS — E039 Hypothyroidism, unspecified: Secondary | ICD-10-CM | POA: Diagnosis not present

## 2023-07-10 DIAGNOSIS — Z87891 Personal history of nicotine dependence: Secondary | ICD-10-CM | POA: Diagnosis not present

## 2023-07-10 DIAGNOSIS — M51369 Other intervertebral disc degeneration, lumbar region without mention of lumbar back pain or lower extremity pain: Secondary | ICD-10-CM | POA: Diagnosis not present

## 2023-07-10 DIAGNOSIS — F419 Anxiety disorder, unspecified: Secondary | ICD-10-CM | POA: Diagnosis not present

## 2023-07-10 DIAGNOSIS — E785 Hyperlipidemia, unspecified: Secondary | ICD-10-CM | POA: Diagnosis not present

## 2023-07-10 DIAGNOSIS — I509 Heart failure, unspecified: Secondary | ICD-10-CM | POA: Diagnosis not present

## 2023-07-10 DIAGNOSIS — J441 Chronic obstructive pulmonary disease with (acute) exacerbation: Secondary | ICD-10-CM | POA: Diagnosis not present

## 2023-07-10 DIAGNOSIS — I2510 Atherosclerotic heart disease of native coronary artery without angina pectoris: Secondary | ICD-10-CM | POA: Diagnosis not present

## 2023-07-10 DIAGNOSIS — Z79891 Long term (current) use of opiate analgesic: Secondary | ICD-10-CM | POA: Diagnosis not present

## 2023-07-10 DIAGNOSIS — Z9981 Dependence on supplemental oxygen: Secondary | ICD-10-CM | POA: Diagnosis not present

## 2023-07-10 DIAGNOSIS — Z556 Problems related to health literacy: Secondary | ICD-10-CM | POA: Diagnosis not present

## 2023-07-10 DIAGNOSIS — E441 Mild protein-calorie malnutrition: Secondary | ICD-10-CM | POA: Diagnosis not present

## 2023-07-13 ENCOUNTER — Ambulatory Visit (HOSPITAL_COMMUNITY): Payer: Medicare Other

## 2023-07-13 DIAGNOSIS — E441 Mild protein-calorie malnutrition: Secondary | ICD-10-CM | POA: Diagnosis not present

## 2023-07-13 DIAGNOSIS — J441 Chronic obstructive pulmonary disease with (acute) exacerbation: Secondary | ICD-10-CM | POA: Diagnosis not present

## 2023-07-13 DIAGNOSIS — I110 Hypertensive heart disease with heart failure: Secondary | ICD-10-CM | POA: Diagnosis not present

## 2023-07-13 DIAGNOSIS — I509 Heart failure, unspecified: Secondary | ICD-10-CM | POA: Diagnosis not present

## 2023-07-16 ENCOUNTER — Telehealth: Payer: Self-pay | Admitting: *Deleted

## 2023-07-16 NOTE — Telephone Encounter (Signed)
Returned patient's sonMerlyn Albert Caldwell's phone call, unable to leave message, due to vm being full

## 2023-07-17 ENCOUNTER — Ambulatory Visit: Payer: Medicare Other | Admitting: Radiation Oncology

## 2023-07-18 ENCOUNTER — Telehealth: Payer: Self-pay | Admitting: Radiation Oncology

## 2023-07-18 DIAGNOSIS — Z791 Long term (current) use of non-steroidal anti-inflammatories (NSAID): Secondary | ICD-10-CM | POA: Diagnosis not present

## 2023-07-18 DIAGNOSIS — M810 Age-related osteoporosis without current pathological fracture: Secondary | ICD-10-CM | POA: Diagnosis not present

## 2023-07-18 DIAGNOSIS — Z556 Problems related to health literacy: Secondary | ICD-10-CM | POA: Diagnosis not present

## 2023-07-18 DIAGNOSIS — H3530 Unspecified macular degeneration: Secondary | ICD-10-CM | POA: Diagnosis not present

## 2023-07-18 DIAGNOSIS — Z85038 Personal history of other malignant neoplasm of large intestine: Secondary | ICD-10-CM | POA: Diagnosis not present

## 2023-07-18 DIAGNOSIS — E441 Mild protein-calorie malnutrition: Secondary | ICD-10-CM | POA: Diagnosis not present

## 2023-07-18 DIAGNOSIS — Z9981 Dependence on supplemental oxygen: Secondary | ICD-10-CM | POA: Diagnosis not present

## 2023-07-18 DIAGNOSIS — M51369 Other intervertebral disc degeneration, lumbar region without mention of lumbar back pain or lower extremity pain: Secondary | ICD-10-CM | POA: Diagnosis not present

## 2023-07-18 DIAGNOSIS — I2510 Atherosclerotic heart disease of native coronary artery without angina pectoris: Secondary | ICD-10-CM | POA: Diagnosis not present

## 2023-07-18 DIAGNOSIS — F419 Anxiety disorder, unspecified: Secondary | ICD-10-CM | POA: Diagnosis not present

## 2023-07-18 DIAGNOSIS — N3281 Overactive bladder: Secondary | ICD-10-CM | POA: Diagnosis not present

## 2023-07-18 DIAGNOSIS — E039 Hypothyroidism, unspecified: Secondary | ICD-10-CM | POA: Diagnosis not present

## 2023-07-18 DIAGNOSIS — Z853 Personal history of malignant neoplasm of breast: Secondary | ICD-10-CM | POA: Diagnosis not present

## 2023-07-18 DIAGNOSIS — I110 Hypertensive heart disease with heart failure: Secondary | ICD-10-CM | POA: Diagnosis not present

## 2023-07-18 DIAGNOSIS — Z7982 Long term (current) use of aspirin: Secondary | ICD-10-CM | POA: Diagnosis not present

## 2023-07-18 DIAGNOSIS — E785 Hyperlipidemia, unspecified: Secondary | ICD-10-CM | POA: Diagnosis not present

## 2023-07-18 DIAGNOSIS — Z7951 Long term (current) use of inhaled steroids: Secondary | ICD-10-CM | POA: Diagnosis not present

## 2023-07-18 DIAGNOSIS — I509 Heart failure, unspecified: Secondary | ICD-10-CM | POA: Diagnosis not present

## 2023-07-18 DIAGNOSIS — J441 Chronic obstructive pulmonary disease with (acute) exacerbation: Secondary | ICD-10-CM | POA: Diagnosis not present

## 2023-07-18 DIAGNOSIS — Z87891 Personal history of nicotine dependence: Secondary | ICD-10-CM | POA: Diagnosis not present

## 2023-07-18 DIAGNOSIS — Z79891 Long term (current) use of opiate analgesic: Secondary | ICD-10-CM | POA: Diagnosis not present

## 2023-07-18 NOTE — Telephone Encounter (Signed)
11/27 @ 4:18 pm Left voicemail to reschedule his mother missed appointments for CT scan/FU20.  Email forward to [facility], so they aware.

## 2023-07-26 DIAGNOSIS — Z556 Problems related to health literacy: Secondary | ICD-10-CM | POA: Diagnosis not present

## 2023-07-26 DIAGNOSIS — E785 Hyperlipidemia, unspecified: Secondary | ICD-10-CM | POA: Diagnosis not present

## 2023-07-26 DIAGNOSIS — N3281 Overactive bladder: Secondary | ICD-10-CM | POA: Diagnosis not present

## 2023-07-26 DIAGNOSIS — I509 Heart failure, unspecified: Secondary | ICD-10-CM | POA: Diagnosis not present

## 2023-07-26 DIAGNOSIS — J441 Chronic obstructive pulmonary disease with (acute) exacerbation: Secondary | ICD-10-CM | POA: Diagnosis not present

## 2023-07-26 DIAGNOSIS — I110 Hypertensive heart disease with heart failure: Secondary | ICD-10-CM | POA: Diagnosis not present

## 2023-07-26 DIAGNOSIS — Z9981 Dependence on supplemental oxygen: Secondary | ICD-10-CM | POA: Diagnosis not present

## 2023-07-26 DIAGNOSIS — Z87891 Personal history of nicotine dependence: Secondary | ICD-10-CM | POA: Diagnosis not present

## 2023-07-26 DIAGNOSIS — F419 Anxiety disorder, unspecified: Secondary | ICD-10-CM | POA: Diagnosis not present

## 2023-07-26 DIAGNOSIS — M51369 Other intervertebral disc degeneration, lumbar region without mention of lumbar back pain or lower extremity pain: Secondary | ICD-10-CM | POA: Diagnosis not present

## 2023-07-26 DIAGNOSIS — E039 Hypothyroidism, unspecified: Secondary | ICD-10-CM | POA: Diagnosis not present

## 2023-07-26 DIAGNOSIS — E441 Mild protein-calorie malnutrition: Secondary | ICD-10-CM | POA: Diagnosis not present

## 2023-07-26 DIAGNOSIS — Z79891 Long term (current) use of opiate analgesic: Secondary | ICD-10-CM | POA: Diagnosis not present

## 2023-07-26 DIAGNOSIS — H3530 Unspecified macular degeneration: Secondary | ICD-10-CM | POA: Diagnosis not present

## 2023-07-26 DIAGNOSIS — M810 Age-related osteoporosis without current pathological fracture: Secondary | ICD-10-CM | POA: Diagnosis not present

## 2023-07-26 DIAGNOSIS — I2510 Atherosclerotic heart disease of native coronary artery without angina pectoris: Secondary | ICD-10-CM | POA: Diagnosis not present

## 2023-07-31 DIAGNOSIS — J441 Chronic obstructive pulmonary disease with (acute) exacerbation: Secondary | ICD-10-CM | POA: Diagnosis not present

## 2023-07-31 DIAGNOSIS — Z79891 Long term (current) use of opiate analgesic: Secondary | ICD-10-CM | POA: Diagnosis not present

## 2023-07-31 DIAGNOSIS — Z9981 Dependence on supplemental oxygen: Secondary | ICD-10-CM | POA: Diagnosis not present

## 2023-07-31 DIAGNOSIS — H3530 Unspecified macular degeneration: Secondary | ICD-10-CM | POA: Diagnosis not present

## 2023-07-31 DIAGNOSIS — M51369 Other intervertebral disc degeneration, lumbar region without mention of lumbar back pain or lower extremity pain: Secondary | ICD-10-CM | POA: Diagnosis not present

## 2023-07-31 DIAGNOSIS — N3281 Overactive bladder: Secondary | ICD-10-CM | POA: Diagnosis not present

## 2023-07-31 DIAGNOSIS — F419 Anxiety disorder, unspecified: Secondary | ICD-10-CM | POA: Diagnosis not present

## 2023-07-31 DIAGNOSIS — E039 Hypothyroidism, unspecified: Secondary | ICD-10-CM | POA: Diagnosis not present

## 2023-07-31 DIAGNOSIS — Z556 Problems related to health literacy: Secondary | ICD-10-CM | POA: Diagnosis not present

## 2023-07-31 DIAGNOSIS — E785 Hyperlipidemia, unspecified: Secondary | ICD-10-CM | POA: Diagnosis not present

## 2023-07-31 DIAGNOSIS — I2510 Atherosclerotic heart disease of native coronary artery without angina pectoris: Secondary | ICD-10-CM | POA: Diagnosis not present

## 2023-07-31 DIAGNOSIS — M810 Age-related osteoporosis without current pathological fracture: Secondary | ICD-10-CM | POA: Diagnosis not present

## 2023-07-31 DIAGNOSIS — E441 Mild protein-calorie malnutrition: Secondary | ICD-10-CM | POA: Diagnosis not present

## 2023-07-31 DIAGNOSIS — Z87891 Personal history of nicotine dependence: Secondary | ICD-10-CM | POA: Diagnosis not present

## 2023-07-31 DIAGNOSIS — I509 Heart failure, unspecified: Secondary | ICD-10-CM | POA: Diagnosis not present

## 2023-07-31 DIAGNOSIS — I110 Hypertensive heart disease with heart failure: Secondary | ICD-10-CM | POA: Diagnosis not present

## 2023-08-02 DIAGNOSIS — M810 Age-related osteoporosis without current pathological fracture: Secondary | ICD-10-CM | POA: Diagnosis not present

## 2023-08-02 DIAGNOSIS — J441 Chronic obstructive pulmonary disease with (acute) exacerbation: Secondary | ICD-10-CM | POA: Diagnosis not present

## 2023-08-02 DIAGNOSIS — E039 Hypothyroidism, unspecified: Secondary | ICD-10-CM | POA: Diagnosis not present

## 2023-08-02 DIAGNOSIS — I2510 Atherosclerotic heart disease of native coronary artery without angina pectoris: Secondary | ICD-10-CM | POA: Diagnosis not present

## 2023-08-02 DIAGNOSIS — Z556 Problems related to health literacy: Secondary | ICD-10-CM | POA: Diagnosis not present

## 2023-08-02 DIAGNOSIS — Z87891 Personal history of nicotine dependence: Secondary | ICD-10-CM | POA: Diagnosis not present

## 2023-08-02 DIAGNOSIS — Z79891 Long term (current) use of opiate analgesic: Secondary | ICD-10-CM | POA: Diagnosis not present

## 2023-08-02 DIAGNOSIS — F419 Anxiety disorder, unspecified: Secondary | ICD-10-CM | POA: Diagnosis not present

## 2023-08-02 DIAGNOSIS — E441 Mild protein-calorie malnutrition: Secondary | ICD-10-CM | POA: Diagnosis not present

## 2023-08-02 DIAGNOSIS — Z9981 Dependence on supplemental oxygen: Secondary | ICD-10-CM | POA: Diagnosis not present

## 2023-08-02 DIAGNOSIS — I110 Hypertensive heart disease with heart failure: Secondary | ICD-10-CM | POA: Diagnosis not present

## 2023-08-02 DIAGNOSIS — I509 Heart failure, unspecified: Secondary | ICD-10-CM | POA: Diagnosis not present

## 2023-08-02 DIAGNOSIS — H3530 Unspecified macular degeneration: Secondary | ICD-10-CM | POA: Diagnosis not present

## 2023-08-02 DIAGNOSIS — E785 Hyperlipidemia, unspecified: Secondary | ICD-10-CM | POA: Diagnosis not present

## 2023-08-02 DIAGNOSIS — N3281 Overactive bladder: Secondary | ICD-10-CM | POA: Diagnosis not present

## 2023-08-02 DIAGNOSIS — M51369 Other intervertebral disc degeneration, lumbar region without mention of lumbar back pain or lower extremity pain: Secondary | ICD-10-CM | POA: Diagnosis not present

## 2023-08-07 DIAGNOSIS — J449 Chronic obstructive pulmonary disease, unspecified: Secondary | ICD-10-CM | POA: Diagnosis not present

## 2023-08-08 DIAGNOSIS — J441 Chronic obstructive pulmonary disease with (acute) exacerbation: Secondary | ICD-10-CM | POA: Diagnosis not present

## 2023-08-08 DIAGNOSIS — E039 Hypothyroidism, unspecified: Secondary | ICD-10-CM | POA: Diagnosis not present

## 2023-08-08 DIAGNOSIS — Z79891 Long term (current) use of opiate analgesic: Secondary | ICD-10-CM | POA: Diagnosis not present

## 2023-08-08 DIAGNOSIS — I509 Heart failure, unspecified: Secondary | ICD-10-CM | POA: Diagnosis not present

## 2023-08-08 DIAGNOSIS — E441 Mild protein-calorie malnutrition: Secondary | ICD-10-CM | POA: Diagnosis not present

## 2023-08-08 DIAGNOSIS — I2510 Atherosclerotic heart disease of native coronary artery without angina pectoris: Secondary | ICD-10-CM | POA: Diagnosis not present

## 2023-08-08 DIAGNOSIS — N3281 Overactive bladder: Secondary | ICD-10-CM | POA: Diagnosis not present

## 2023-08-08 DIAGNOSIS — F419 Anxiety disorder, unspecified: Secondary | ICD-10-CM | POA: Diagnosis not present

## 2023-08-08 DIAGNOSIS — Z9981 Dependence on supplemental oxygen: Secondary | ICD-10-CM | POA: Diagnosis not present

## 2023-08-08 DIAGNOSIS — H3530 Unspecified macular degeneration: Secondary | ICD-10-CM | POA: Diagnosis not present

## 2023-08-08 DIAGNOSIS — Z87891 Personal history of nicotine dependence: Secondary | ICD-10-CM | POA: Diagnosis not present

## 2023-08-08 DIAGNOSIS — E785 Hyperlipidemia, unspecified: Secondary | ICD-10-CM | POA: Diagnosis not present

## 2023-08-08 DIAGNOSIS — M810 Age-related osteoporosis without current pathological fracture: Secondary | ICD-10-CM | POA: Diagnosis not present

## 2023-08-08 DIAGNOSIS — I110 Hypertensive heart disease with heart failure: Secondary | ICD-10-CM | POA: Diagnosis not present

## 2023-08-08 DIAGNOSIS — M51369 Other intervertebral disc degeneration, lumbar region without mention of lumbar back pain or lower extremity pain: Secondary | ICD-10-CM | POA: Diagnosis not present

## 2023-08-08 DIAGNOSIS — Z556 Problems related to health literacy: Secondary | ICD-10-CM | POA: Diagnosis not present

## 2023-08-09 DIAGNOSIS — M810 Age-related osteoporosis without current pathological fracture: Secondary | ICD-10-CM | POA: Diagnosis not present

## 2023-08-13 DIAGNOSIS — J441 Chronic obstructive pulmonary disease with (acute) exacerbation: Secondary | ICD-10-CM | POA: Diagnosis not present

## 2023-08-13 DIAGNOSIS — Z556 Problems related to health literacy: Secondary | ICD-10-CM | POA: Diagnosis not present

## 2023-08-13 DIAGNOSIS — M810 Age-related osteoporosis without current pathological fracture: Secondary | ICD-10-CM | POA: Diagnosis not present

## 2023-08-13 DIAGNOSIS — Z9981 Dependence on supplemental oxygen: Secondary | ICD-10-CM | POA: Diagnosis not present

## 2023-08-13 DIAGNOSIS — Z85038 Personal history of other malignant neoplasm of large intestine: Secondary | ICD-10-CM | POA: Diagnosis not present

## 2023-08-13 DIAGNOSIS — Z7982 Long term (current) use of aspirin: Secondary | ICD-10-CM | POA: Diagnosis not present

## 2023-08-13 DIAGNOSIS — I110 Hypertensive heart disease with heart failure: Secondary | ICD-10-CM | POA: Diagnosis not present

## 2023-08-13 DIAGNOSIS — F419 Anxiety disorder, unspecified: Secondary | ICD-10-CM | POA: Diagnosis not present

## 2023-08-13 DIAGNOSIS — Z791 Long term (current) use of non-steroidal anti-inflammatories (NSAID): Secondary | ICD-10-CM | POA: Diagnosis not present

## 2023-08-13 DIAGNOSIS — E441 Mild protein-calorie malnutrition: Secondary | ICD-10-CM | POA: Diagnosis not present

## 2023-08-13 DIAGNOSIS — Z853 Personal history of malignant neoplasm of breast: Secondary | ICD-10-CM | POA: Diagnosis not present

## 2023-08-13 DIAGNOSIS — I2510 Atherosclerotic heart disease of native coronary artery without angina pectoris: Secondary | ICD-10-CM | POA: Diagnosis not present

## 2023-08-13 DIAGNOSIS — N3281 Overactive bladder: Secondary | ICD-10-CM | POA: Diagnosis not present

## 2023-08-13 DIAGNOSIS — H3530 Unspecified macular degeneration: Secondary | ICD-10-CM | POA: Diagnosis not present

## 2023-08-13 DIAGNOSIS — E039 Hypothyroidism, unspecified: Secondary | ICD-10-CM | POA: Diagnosis not present

## 2023-08-13 DIAGNOSIS — Z79891 Long term (current) use of opiate analgesic: Secondary | ICD-10-CM | POA: Diagnosis not present

## 2023-08-13 DIAGNOSIS — Z7951 Long term (current) use of inhaled steroids: Secondary | ICD-10-CM | POA: Diagnosis not present

## 2023-08-13 DIAGNOSIS — I509 Heart failure, unspecified: Secondary | ICD-10-CM | POA: Diagnosis not present

## 2023-08-13 DIAGNOSIS — E785 Hyperlipidemia, unspecified: Secondary | ICD-10-CM | POA: Diagnosis not present

## 2023-08-13 DIAGNOSIS — Z87891 Personal history of nicotine dependence: Secondary | ICD-10-CM | POA: Diagnosis not present

## 2023-08-13 DIAGNOSIS — M51369 Other intervertebral disc degeneration, lumbar region without mention of lumbar back pain or lower extremity pain: Secondary | ICD-10-CM | POA: Diagnosis not present

## 2023-08-16 DIAGNOSIS — I2510 Atherosclerotic heart disease of native coronary artery without angina pectoris: Secondary | ICD-10-CM | POA: Diagnosis not present

## 2023-08-16 DIAGNOSIS — I509 Heart failure, unspecified: Secondary | ICD-10-CM | POA: Diagnosis not present

## 2023-08-16 DIAGNOSIS — E039 Hypothyroidism, unspecified: Secondary | ICD-10-CM | POA: Diagnosis not present

## 2023-08-16 DIAGNOSIS — Z9981 Dependence on supplemental oxygen: Secondary | ICD-10-CM | POA: Diagnosis not present

## 2023-08-16 DIAGNOSIS — Z87891 Personal history of nicotine dependence: Secondary | ICD-10-CM | POA: Diagnosis not present

## 2023-08-16 DIAGNOSIS — H3530 Unspecified macular degeneration: Secondary | ICD-10-CM | POA: Diagnosis not present

## 2023-08-16 DIAGNOSIS — E441 Mild protein-calorie malnutrition: Secondary | ICD-10-CM | POA: Diagnosis not present

## 2023-08-16 DIAGNOSIS — Z556 Problems related to health literacy: Secondary | ICD-10-CM | POA: Diagnosis not present

## 2023-08-16 DIAGNOSIS — I110 Hypertensive heart disease with heart failure: Secondary | ICD-10-CM | POA: Diagnosis not present

## 2023-08-16 DIAGNOSIS — F419 Anxiety disorder, unspecified: Secondary | ICD-10-CM | POA: Diagnosis not present

## 2023-08-16 DIAGNOSIS — M810 Age-related osteoporosis without current pathological fracture: Secondary | ICD-10-CM | POA: Diagnosis not present

## 2023-08-16 DIAGNOSIS — J441 Chronic obstructive pulmonary disease with (acute) exacerbation: Secondary | ICD-10-CM | POA: Diagnosis not present

## 2023-08-16 DIAGNOSIS — N3281 Overactive bladder: Secondary | ICD-10-CM | POA: Diagnosis not present

## 2023-08-16 DIAGNOSIS — Z79891 Long term (current) use of opiate analgesic: Secondary | ICD-10-CM | POA: Diagnosis not present

## 2023-08-16 DIAGNOSIS — E785 Hyperlipidemia, unspecified: Secondary | ICD-10-CM | POA: Diagnosis not present

## 2023-08-16 DIAGNOSIS — M51369 Other intervertebral disc degeneration, lumbar region without mention of lumbar back pain or lower extremity pain: Secondary | ICD-10-CM | POA: Diagnosis not present

## 2023-08-29 DIAGNOSIS — H04123 Dry eye syndrome of bilateral lacrimal glands: Secondary | ICD-10-CM | POA: Diagnosis not present

## 2023-08-29 DIAGNOSIS — H401331 Pigmentary glaucoma, bilateral, mild stage: Secondary | ICD-10-CM | POA: Diagnosis not present

## 2023-08-29 DIAGNOSIS — Z961 Presence of intraocular lens: Secondary | ICD-10-CM | POA: Diagnosis not present

## 2023-08-29 DIAGNOSIS — H353231 Exudative age-related macular degeneration, bilateral, with active choroidal neovascularization: Secondary | ICD-10-CM | POA: Diagnosis not present

## 2023-08-30 NOTE — Progress Notes (Signed)
 [facility] - Clinic Note  08/31/2023     CHIEF COMPLAINT Patient presents for Retina Follow Up    HISTORY OF PRESENT ILLNESS: Marissa Caldwell is a 89 y.o. female who presents to the clinic today for:   HPI     Retina Follow Up   Patient presents with  Wet AMD.  In both eyes.  This started 1 year ago.  Duration of 1 year.  Since onset it is gradually worsening.  I, the attending physician,  performed the HPI with the patient and updated documentation appropriately.        Comments   Retina follow up per DR Fleeta for AMD pt is reporting vision is not as sharp she denies any flashes or floaters pt is using brim bid OS and latanoprost  at bedtime ou       Last edited by Valdemar Rogue, MD on 08/31/2023  1:35 PM.     Pt states VA has decreased for reading.   Referring physician:  Fleeta Zerita DASEN, MD [address] [place],  [place] [address]  HISTORICAL INFORMATION:   Selected notes from the MEDICAL RECORD NUMBER Referred by Dr. Medford Ferrier for concern of exudative ARMD OD   CURRENT MEDICATIONS: Current Outpatient Medications (Ophthalmic Drugs)  Medication Sig   brimonidine  (ALPHAGAN ) 0.2 % ophthalmic solution Place 1 drop into the left eye 2 (two) times daily.   brimonidine -timolol  (COMBIGAN) 0.2-0.5 % ophthalmic solution Apply to eye.   latanoprost  (XALATAN ) 0.005 % ophthalmic solution Place 1 drop into both eyes at bedtime.   Current Facility-Administered Medications (Ophthalmic Drugs)  Medication Route   aflibercept  (EYLEA ) SOLN 2 mg Intravitreal   aflibercept  (EYLEA ) SOLN 2 mg Intravitreal   aflibercept  (EYLEA ) SOLN 2 mg Intravitreal   aflibercept  (EYLEA ) SOLN 2 mg Intravitreal   aflibercept  (EYLEA ) SOLN 2 mg Intravitreal   Current Outpatient Medications (Other)  Medication Sig   albuterol  (PROVENTIL  HFA;VENTOLIN  HFA) 108 (90 Base) MCG/ACT inhaler Inhale 2 puffs into the lungs every 6 (six) hours as needed for wheezing or shortness of  breath.   budesonide -formoterol  (SYMBICORT ) 160-4.5 MCG/ACT inhaler Inhale 2 puffs into the lungs 2 (two) times daily.   Calcium  Carb-Cholecalciferol  (CALCIUM  + D3) 600-200 MG-UNIT TABS Take 1 tablet by mouth daily.   Cholecalciferol  (VITAMIN D3) 2000 units capsule Take 4,000 Units by mouth daily.   Coenzyme Q10 (COQ10) 100 MG CAPS Take 100 mg by mouth daily.   diclofenac  (CATAFLAM ) 50 MG tablet Take 1 tablet (50 mg total) by mouth daily after breakfast.   DULERA  200-5 MCG/ACT AERO Inhale 2 puffs into the lungs 2 (two) times daily.   famotidine  (PEPCID ) 20 MG tablet Take 20 mg by mouth daily.   gabapentin  (NEURONTIN ) 100 MG capsule Take 1 capsule (100 mg total) by mouth at bedtime.   levothyroxine  (SYNTHROID , LEVOTHROID) 100 MCG tablet Take 100 mcg by mouth daily before breakfast.    LORazepam  (ATIVAN ) 0.5 MG tablet Take 0.25-0.5 mg by mouth every 8 (eight) hours as needed for anxiety.   LUTEIN  PO Take 1 tablet by mouth daily.   mirtazapine  (REMERON ) 15 MG tablet Take 15 mg by mouth at bedtime.   Multiple Vitamin (MULTIVITAMIN) tablet Take 1 tablet by mouth daily.   nitroGLYCERIN  (NITROSTAT ) 0.4 MG SL tablet Place 1 tablet (0.4 mg total) under the tongue every 5 (five) minutes as needed for chest pain.   Omega-3 Fatty Acids (FISH OIL PO) Take 1 capsule by mouth daily.   pravastatin  (PRAVACHOL )  40 MG tablet TAKE 1 TABLET BY MOUTH DAILY GENERIC EQUIVALENT FOR PRAVACHOL    solifenacin (VESICARE) 5 MG tablet Take 5 mg by mouth daily.   tamoxifen  (NOLVADEX ) 20 MG tablet TAKE 1 TABLET BY MOUTH ONCE DAILY . APPOINTMENT REQUIRED FOR FUTURE REFILLS   TOVIAZ  4 MG TB24 tablet Take 4 mg by mouth daily.    traMADol  (ULTRAM ) 50 MG tablet Take 0.5-1 tablets (25-50 mg total) by mouth every 8 (eight) hours as needed.   Current Facility-Administered Medications (Other)  Medication Route   Bevacizumab  (AVASTIN ) SOLN 1.25 mg Intravitreal   Bevacizumab  (AVASTIN ) SOLN 1.25 mg Intravitreal   Bevacizumab  (AVASTIN )  SOLN 1.25 mg Intravitreal   REVIEW OF SYSTEMS: ROS   Positive for: HENT, Endocrine, Cardiovascular, Eyes, Respiratory, Heme/Lymph Negative for: Constitutional, Gastrointestinal, Neurological, Skin, Genitourinary, Musculoskeletal, Psychiatric, Allergic/Imm Last edited by Resa Delon ORN, COT on 08/31/2023  8:59 AM.      ALLERGIES Allergies  Allergen Reactions   Lipitor [Atorvastatin ]     Elevated LFT's   PAST MEDICAL HISTORY Past Medical History:  Diagnosis Date   Anxiety    Arthritis    Breast cancer (HCC)    CAD (coronary artery disease)    a. 03/2017: 95% LCx stenosis --> PCI/DES placed   Cataract    Chest pain 03/27/2017   Chronic diastolic heart failure (HCC) 06/25/2017   Chronic lower back pain    Colon cancer (HCC) 11/2019   s/p colectomy   COPD (chronic obstructive pulmonary disease) (HCC)    Coronary artery disease    Diverticulitis    Diverticulosis    GERD (gastroesophageal reflux disease)    Glaucoma    Heart failure, type unknown (HCC) 03/11/2017   History of radiation therapy 08/05/19- 09/03/19   Right Breast/ SCV 16 fractions of 2.66 Gy each for a total of 42.56 Gy. Right breast boost 4 fractions of 2 Gy each to total 8 Gy.    Hyperlipidemia 03/11/2017   Hypertension    Hypothyroidism    Macular degeneration    wet and dry per pt's son   Personal history of chemotherapy    Personal history of radiation therapy    Pneumonia X 1   years and years ago (03/26/2017)   Shortness of breath 03/11/2017   Status post dilation of esophageal narrowing    Past Surgical History:  Procedure Laterality Date   BILATERAL OOPHORECTOMY Bilateral    BREAST LUMPECTOMY Right 06/19/2019   BREAST LUMPECTOMY WITH RADIOACTIVE SEED AND SENTINEL LYMPH NODE BIOPSY Right 06/19/2019   Procedure: RIGHT BREAST LUMPECTOMY WITH RADIOACTIVE SEED AND SENTINEL LYMPH NODE BIOPSY;  Surgeon: Curvin Deward MOULD, MD;  Location: [facility] OR;  Service: General;  Laterality: Right;   CATARACT EXTRACTION      CATARACT EXTRACTION W/ INTRAOCULAR LENS  IMPLANT, BILATERAL Bilateral    COLOSTOMY     Greater than 10 yrs   CORONARY ANGIOPLASTY WITH STENT PLACEMENT  03/26/2017   LAPAROSCOPIC RIGHT COLECTOMY Right 12/15/2019   Procedure: LAPAROSCOPIC ASSISTED RIGHT COLECTOMY;  Surgeon: Curvin Deward MOULD, MD;  Location: [facility] OR;  Service: General;  Laterality: Right;   LEFT HEART CATH AND CORONARY ANGIOGRAPHY N/A 03/26/2017   Procedure: Left Heart Cath and Coronary Angiography;  Surgeon: Claudene Victory ORN, MD;  Location: [facility] INVASIVE CV LAB;  Service: Cardiovascular;  Laterality: N/A;   NERVE SURGERY     lump on my sciatic nerve was removed years ago   SHOULDER OPEN ROTATOR CUFF REPAIR Right    THYROIDECTOMY, PARTIAL  TONSILLECTOMY     UPPER GASTROINTESTINAL ENDOSCOPY     > ten yrs   FAMILY HISTORY Family History  Problem Relation Age of Onset   CAD Mother    Cataracts Mother    Coronary artery disease Father    Leukemia Sister    Amblyopia Neg Hx    Blindness Neg Hx    Diabetes Neg Hx    Glaucoma Neg Hx    Macular degeneration Neg Hx    Retinal detachment Neg Hx    Strabismus Neg Hx    Retinitis pigmentosa Neg Hx    Colon cancer Neg Hx    Colon polyps Neg Hx    Esophageal cancer Neg Hx    Rectal cancer Neg Hx    Stomach cancer Neg Hx    SOCIAL HISTORY Social History   Tobacco Use   Smoking status: Former    Current packs/day: 0.50    Average packs/day: 0.5 packs/day for 60.0 years (30.0 ttl pk-yrs)    Types: Cigarettes   Smokeless tobacco: Never  Vaping Use   Vaping status: Never Used  Substance Use Topics   Alcohol use: Not Currently    Comment: twice a month   Drug use: No       OPHTHALMIC EXAM:  Base Eye Exam     Visual Acuity (Snellen - Linear)       Right Left   Dist [place] CF at 3' CF at 3'   Dist ph [place] NI NI         Tonometry (Tonopen, 9:07 AM)       Right Left   Pressure 5 8         Pupils       Pupils Dark Light Shape React APD   Right PERRL 3 2  Round Sluggish None   Left PERRL 3 2 Round Sluggish None         Visual Fields       Left Right    Full Full         Extraocular Movement       Right Left    Full, Ortho Full, Ortho         Neuro/Psych     Oriented x3: Yes   Mood/Affect: Normal         Dilation     Both eyes: 2.5% Phenylephrine  @ 9:07 AM           Slit Lamp and Fundus Exam     Slit Lamp Exam       Right Left   Lids/Lashes Dermatochalasis - upper lid, Telangiectasia, Meibomian gland dysfunction - improving Dermatochalasis - upper lid, Telangiectasia, Meibomian gland dysfunction   Conjunctiva/Sclera White and quiet trace Injection   Cornea Arcus, 2-3+ Punctate epithelial erosions, 3+ fine endo pigment, tear film debris, mild central haze Arcus, 3+ Punctate epithelial erosions, endo pigment / Krunkenberg's spindle, mild central haze   Anterior Chamber Deep and quiet Deep and quiet   Iris Round and dilated Round and dilated   Lens Three piece PC IOL in good position, open PC PC IOL in good position, clear PC   Anterior Vitreous Vitreous syneresis, Posterior vitreous detachment, mild vitreous condensations Mild Vitreous syneresis, Posterior vitreous detachment         Fundus Exam       Right Left   Disc sharp rim, 360 Peripapillary atrophy, 2-3+Pallor 360 Peripapillary atrophy, 2+pallor, sharp rim   C/D Ratio 0.2 0.1   Macula Blunted foveal reflex,  Drusen, RPE mottling, clumping and atrophy, +PED, +CNVM, sub-retinal central Disciform scar with subretinal fibrosis, no hem, central pigment clumping Blunted foveal reflex, Drusen, RPE mottling, clumping, and atrophy, focal GA nasal macula extending into fovea, no heme, new CNV with +edema temporal to fovea, no frank heme   Vessels attenuated, mild tortuosity attenuated, mild tortuosity   Periphery Attached, reticular degeneration, No heme Attached, reticular degeneration, Focal IRH above disc           Refraction     Manifest Refraction    Unable to correct pt states does not looked           IMAGING AND PROCEDURES  Imaging and Procedures for @TODAY @  OCT, Retina - OU - Both Eyes       Right Eye Quality was good. Central Foveal Thickness: 283. Progression has been stable. Findings include no IRF, no SRF, abnormal foveal contour, retinal drusen , subretinal hyper-reflective material, disciform scar, epiretinal membrane, pigment epithelial detachment, outer retinal atrophy (persistent thick PED/SRHM/disciform scar -- stable from prior).   Left Eye Quality was good. Central Foveal Thickness: 288. Progression has worsened. Findings include no IRF, no SRF, abnormal foveal contour, retinal drusen , subretinal hyper-reflective material, pigment epithelial detachment, outer retinal atrophy (Interval development of PED/CNV with SRHM/ edema overlying, Significant central GA / ORA-- mild progression of ORA).   Notes *Images captured and stored on drive  Diagnosis / Impression:  OD: Exudative AMD with significant submacular scar/SRHM --  persistent thick PED/SRHM/disciform scar -- no IRF/SRF OS: Interval development of PED/CNV with SRHM/ edema overlying, Significant central GA / ORA-- mild progression of ORA  Clinical management:  See below  Abbreviations: NFP - Normal foveal profile. CME - cystoid macular edema. PED - pigment epithelial detachment. IRF - intraretinal fluid. SRF - subretinal fluid. EZ - ellipsoid zone. ERM - epiretinal membrane. ORA - outer retinal atrophy. ORT - outer retinal tubulation. SRHM - subretinal hyper-reflective material       Intravitreal Injection, Pharmacologic Agent - OS - Left Eye       Time Out 08/31/2023. 10:01 AM. Confirmed correct patient, procedure, site, and patient consented.   Anesthesia Topical anesthesia was used. Anesthetic medications included Lidocaine  2%, Proparacaine 0.5%.   Procedure Preparation included 5% betadine  to ocular surface, eyelid speculum. A supplied needle  was used.   Injection: 1.25 mg Bevacizumab  1.25mg /0.46ml   Route: Intravitreal, Site: Left Eye   NDC: [id number], Lot: [id number], Expiration date: 10/04/2023   Post-op Post injection exam found visual acuity of at least counting fingers. The patient tolerated the procedure well. There were no complications. The patient received written and verbal post procedure care education. Post injection medications were not given.            ASSESSMENT/PLAN:    ICD-10-CM   1. 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 Exudative age-related macular degeneration of left eye with active choroidal neovascularization (HCC)  H35.3221 OCT, Retina - OU - Both Eyes    Intravitreal Injection, Pharmacologic Agent - OS - Left Eye    Bevacizumab  (AVASTIN ) SOLN 1.25 mg    3. Posterior vitreous detachment of both eyes  H43.813     4. Pigmentary glaucoma of both eyes, mild stage  H40.1331     5. Pseudophakia of both eyes  Z96.1     6. Dry eyes  H04.123      Pt has been  lost to retina f/u since Jan 2024 -- various illnesses and other health issues - referred back today 01.10.25, by Dr. Fleeta for conversion of OS to exudative ARMD  1. Exudative age related macular degeneration, OD -- inactive CNV / scar  - onset ~2 mos prior to presentation, waited for scheduled appt with Dr. Lelon  - S/P IVA OD #1 (05.20.19), #2 (07.02.19), #3 (07.30.19) - S/P IVE OD #1 (08.27.19), #2 (09.24.19), #3 (11.05.19), #4 (12.17.19), #5 (02.21.20), #6 (05.05.20), # 7 (08.05.20), #8 (9.2.20), #9 (11.10.20), #10 (02.12.21), #11 (05.12.21), #12 (08.13.21)  - VA remains CF - OCT with stable improvment in peripapillary / nasal macula IRF and SRF; persistent thick PED/SRHM/disciform scar centrally  - discussed findings and guarded prognosis and treatment options - due to stable, inactive submacular scar and VA CF -- recommend holding off on injection  today  - pt in agreement  2. Exudative age related macular degeneration, left eye  - interval conversion from nonexudative ARMD since last visit -- pt has been lost to retina f/u since Jan 2024  - referred back here by Dr. Fleeta  - BCVA OS: CF 3' from 20/200 - OCT shows Interval development of PED/CNV with SRHM/edema overlying, Significant central GA / ORA-- mild progression of ORA - recommend IVA OS #1 today 01.10.25 - RBA of procedure discussed, questions answered - Avastin  consent obtained, signed, and scanned 01.10.25 - see procedure note   - f/u 4 weeks, DFE, OCT, possible injection  3. PVD / vitreous syneresis OU  Discussed findings and prognosis  No RT or RD on 360 peripheral exam  Reviewed s/s of RT/RD  Strict return precautions for any such RT/RD symptoms  4. Pigmentary glaucoma OU-   - using latanoprost  OU qhs / brimonidine  BID OU  - IOP good today -- 10,9  - monitor  5. Pseudophakia OU  - s/p CE/IOL OU  - beautiful surgery, doing well  - monitor  6. Dry eyes OU  - recommend artificial tears and lubricating ointment as needed  Ophthalmic Meds Ordered this visit:  Meds ordered this encounter  Medications   Bevacizumab  (AVASTIN ) SOLN 1.25 mg     Return in about 4 weeks (around 09/28/2023) for f/u EX. AMD OU , DFE, OCT, Possible, IVA, OS.  There are no Patient Instructions on file for this visit.  This document serves as a record of services personally performed by Redell JUDITHANN Hans, MD, PhD. It was created on their behalf by Delon Newness COT, an ophthalmic technician. The creation of this record is the provider's dictation and/or activities during the visit.    Electronically signed by: Delon Newness COT 01.09.24 1:37 PM  This document serves as a record of services personally performed by Redell JUDITHANN Hans, MD, PhD. It was created on their behalf by Wanda GEANNIE Keens, COT an ophthalmic technician. The creation of this record is the provider's dictation  and/or activities during the visit.    Electronically signed by:  Wanda GEANNIE Keens, COT  08/31/23 1:37 PM  Redell JUDITHANN Hans, M.D., Ph.D. Diseases & Surgery of the Retina and Vitreous [facility] 08/31/2023   I have reviewed the above documentation for accuracy and completeness, and I agree with the above. Redell JUDITHANN Hans, M.D., Ph.D. 08/31/23 1:42 PM   Abbreviations: M myopia (nearsighted); A astigmatism; H hyperopia (farsighted); P presbyopia; Mrx spectacle prescription;  CTL contact lenses; OD right eye; OS left eye; OU both eyes  XT exotropia; ET esotropia; PEK  punctate epithelial keratitis; PEE punctate epithelial erosions; DES dry eye syndrome; MGD meibomian gland dysfunction; ATs artificial tears; PFAT's preservative free artificial tears; NSC nuclear sclerotic cataract; PSC posterior subcapsular cataract; ERM epi-retinal membrane; PVD posterior vitreous detachment; RD retinal detachment; DM diabetes mellitus; DR diabetic retinopathy; NPDR non-proliferative diabetic retinopathy; PDR proliferative diabetic retinopathy; CSME clinically significant macular edema; DME diabetic macular edema; dbh dot blot hemorrhages; CWS cotton wool spot; POAG primary open angle glaucoma; C/D cup-to-disc ratio; HVF humphrey visual field; GVF goldmann visual field; OCT optical coherence tomography; IOP intraocular pressure; BRVO Branch retinal vein occlusion; CRVO central retinal vein occlusion; CRAO central retinal artery occlusion; BRAO branch retinal artery occlusion; RT retinal tear; SB scleral buckle; PPV pars plana vitrectomy; VH Vitreous hemorrhage; PRP panretinal laser photocoagulation; IVK intravitreal kenalog; VMT vitreomacular traction; MH Macular hole;  NVD neovascularization of the disc; NVE neovascularization elsewhere; AREDS age related eye disease study; ARMD age related macular degeneration; POAG primary open angle glaucoma; EBMD epithelial/anterior basement membrane dystrophy;  ACIOL anterior chamber intraocular lens; IOL intraocular lens; PCIOL posterior chamber intraocular lens; Phaco/IOL phacoemulsification with intraocular lens placement; PRK photorefractive keratectomy; LASIK laser assisted in situ keratomileusis; HTN hypertension; DM diabetes mellitus; COPD chronic obstructive pulmonary disease

## 2023-08-31 ENCOUNTER — Ambulatory Visit (INDEPENDENT_AMBULATORY_CARE_PROVIDER_SITE_OTHER): Payer: Medicare Other | Admitting: Ophthalmology

## 2023-08-31 ENCOUNTER — Encounter (INDEPENDENT_AMBULATORY_CARE_PROVIDER_SITE_OTHER): Payer: Self-pay | Admitting: Ophthalmology

## 2023-08-31 DIAGNOSIS — H43813 Vitreous degeneration, bilateral: Secondary | ICD-10-CM

## 2023-08-31 DIAGNOSIS — H353221 Exudative age-related macular degeneration, left eye, with active choroidal neovascularization: Secondary | ICD-10-CM | POA: Diagnosis not present

## 2023-08-31 DIAGNOSIS — H353213 Exudative age-related macular degeneration, right eye, with inactive scar: Secondary | ICD-10-CM

## 2023-08-31 DIAGNOSIS — H401331 Pigmentary glaucoma, bilateral, mild stage: Secondary | ICD-10-CM | POA: Diagnosis not present

## 2023-08-31 DIAGNOSIS — Z961 Presence of intraocular lens: Secondary | ICD-10-CM

## 2023-08-31 DIAGNOSIS — H353124 Nonexudative age-related macular degeneration, left eye, advanced atrophic with subfoveal involvement: Secondary | ICD-10-CM

## 2023-08-31 DIAGNOSIS — H04123 Dry eye syndrome of bilateral lacrimal glands: Secondary | ICD-10-CM

## 2023-08-31 MED ORDER — BEVACIZUMAB CHEMO INJECTION 1.25MG/0.05ML SYRINGE FOR KALEIDOSCOPE
1.2500 mg | INTRAVITREAL | Status: AC | PRN
Start: 2023-08-31 — End: 2023-08-31
  Administered 2023-08-31: 1.25 mg via INTRAVITREAL

## 2023-09-07 DIAGNOSIS — J449 Chronic obstructive pulmonary disease, unspecified: Secondary | ICD-10-CM | POA: Diagnosis not present

## 2023-09-12 DIAGNOSIS — M461 Sacroiliitis, not elsewhere classified: Secondary | ICD-10-CM | POA: Diagnosis not present

## 2023-09-12 DIAGNOSIS — M47817 Spondylosis without myelopathy or radiculopathy, lumbosacral region: Secondary | ICD-10-CM | POA: Diagnosis not present

## 2023-09-19 ENCOUNTER — Telehealth: Payer: Self-pay | Admitting: *Deleted

## 2023-09-19 ENCOUNTER — Telehealth: Payer: Self-pay | Admitting: Radiation Oncology

## 2023-09-19 NOTE — Telephone Encounter (Signed)
1/29 @ 1:32 pm Patient's son called to get patient reschedule for her missed appointments in November 2024, CT scan and FU20.  Called and forwarded call to Darryl Nestle, so they are aware. Patient's son is aware to left voicemail.

## 2023-09-19 NOTE — Telephone Encounter (Signed)
RETURNED PATIENT'S SON'S PHONE CALL (FRED RIDGE), SPOKE WITH FRED RIDGE

## 2023-09-25 NOTE — Progress Notes (Signed)
 [facility] - Clinic Note  10/01/2023     CHIEF COMPLAINT Patient presents for Retina Follow Up    HISTORY OF PRESENT ILLNESS: Marissa Caldwell is a 89 y.o. female who presents to the clinic today for:   HPI     Retina Follow Up   Patient presents with  Wet AMD.  In both eyes.  This started 4 weeks ago.  Duration of 4 weeks.  Since onset it is gradually worsening.  I, the attending physician,  performed the HPI with the patient and updated documentation appropriately.        Comments   Patient states the vision is the same. She is using Latanoprost  OU at bedtime, Brimonidine  BID OU, and AT's.      Last edited by Ronelle Coffee, MD on 10/01/2023  1:39 PM.    Pt states vision is stable, no problems after the last injection  Referring physician:  Victorio Grave, MD [phone] [address] [place],  [place] [address]  HISTORICAL INFORMATION:   Selected notes from the MEDICAL RECORD NUMBER Referred by Dr. Donovan Gallant for concern of exudative ARMD OD   CURRENT MEDICATIONS: Current Outpatient Medications (Ophthalmic Drugs)  Medication Sig   brimonidine  (ALPHAGAN ) 0.2 % ophthalmic solution Place 1 drop into the left eye 2 (two) times daily.   brimonidine -timolol  (COMBIGAN) 0.2-0.5 % ophthalmic solution Apply to eye.   latanoprost  (XALATAN ) 0.005 % ophthalmic solution Place 1 drop into both eyes at bedtime.   Current Facility-Administered Medications (Ophthalmic Drugs)  Medication Route   aflibercept  (EYLEA ) SOLN 2 mg Intravitreal   aflibercept  (EYLEA ) SOLN 2 mg Intravitreal   aflibercept  (EYLEA ) SOLN 2 mg Intravitreal   aflibercept  (EYLEA ) SOLN 2 mg Intravitreal   aflibercept  (EYLEA ) SOLN 2 mg Intravitreal   Current Outpatient Medications (Other)  Medication Sig   albuterol  (PROVENTIL  HFA;VENTOLIN  HFA) 108 (90 Base) MCG/ACT inhaler Inhale 2 puffs into the lungs every 6 (six) hours as needed for wheezing or shortness of breath.    budesonide -formoterol  (SYMBICORT ) 160-4.5 MCG/ACT inhaler Inhale 2 puffs into the lungs 2 (two) times daily.   Calcium  Carb-Cholecalciferol  (CALCIUM  + D3) 600-200 MG-UNIT TABS Take 1 tablet by mouth daily.   Cholecalciferol  (VITAMIN D3) 2000 units capsule Take 4,000 Units by mouth daily.   Coenzyme Q10 (COQ10) 100 MG CAPS Take 100 mg by mouth daily.   diclofenac  (CATAFLAM ) 50 MG tablet Take 1 tablet (50 mg total) by mouth daily after breakfast.   DULERA  200-5 MCG/ACT AERO Inhale 2 puffs into the lungs 2 (two) times daily.   famotidine  (PEPCID ) 20 MG tablet Take 20 mg by mouth daily.   gabapentin  (NEURONTIN ) 100 MG capsule Take 1 capsule (100 mg total) by mouth at bedtime.   levothyroxine  (SYNTHROID , LEVOTHROID) 100 MCG tablet Take 100 mcg by mouth daily before breakfast.    LORazepam  (ATIVAN ) 0.5 MG tablet Take 0.25-0.5 mg by mouth every 8 (eight) hours as needed for anxiety.   LUTEIN  PO Take 1 tablet by mouth daily.   mirtazapine  (REMERON ) 15 MG tablet Take 15 mg by mouth at bedtime.   Multiple Vitamin (MULTIVITAMIN) tablet Take 1 tablet by mouth daily.   nitroGLYCERIN  (NITROSTAT ) 0.4 MG SL tablet Place 1 tablet (0.4 mg total) under the tongue every 5 (five) minutes as needed for chest pain.   Omega-3 Fatty Acids (FISH OIL PO) Take 1 capsule by mouth daily.   pravastatin  (PRAVACHOL ) 40 MG tablet TAKE 1 TABLET BY MOUTH DAILY GENERIC EQUIVALENT  FOR PRAVACHOL    solifenacin (VESICARE) 5 MG tablet Take 5 mg by mouth daily.   tamoxifen  (NOLVADEX ) 20 MG tablet TAKE 1 TABLET BY MOUTH ONCE DAILY . APPOINTMENT REQUIRED FOR FUTURE REFILLS   TOVIAZ  4 MG TB24 tablet Take 4 mg by mouth daily.    traMADol  (ULTRAM ) 50 MG tablet Take 0.5-1 tablets (25-50 mg total) by mouth every 8 (eight) hours as needed.   Current Facility-Administered Medications (Other)  Medication Route   Bevacizumab  (AVASTIN ) SOLN 1.25 mg Intravitreal   Bevacizumab  (AVASTIN ) SOLN 1.25 mg Intravitreal   Bevacizumab  (AVASTIN ) SOLN 1.25  mg Intravitreal   REVIEW OF SYSTEMS: ROS   Positive for: HENT, Endocrine, Cardiovascular, Eyes, Respiratory, Heme/Lymph Negative for: Constitutional, Gastrointestinal, Neurological, Skin, Genitourinary, Musculoskeletal, Psychiatric, Allergic/Imm Last edited by Olene Berne, COT on 10/01/2023 12:53 PM.       ALLERGIES Allergies  Allergen Reactions   Lipitor [Atorvastatin ]     Elevated LFT's   PAST MEDICAL HISTORY Past Medical History:  Diagnosis Date   Anxiety    Arthritis    Breast cancer (HCC)    CAD (coronary artery disease)    a. 03/2017: 95% LCx stenosis --> PCI/DES placed   Cataract    Chest pain 03/27/2017   Chronic diastolic heart failure (HCC) 06/25/2017   Chronic lower back pain    Colon cancer (HCC) 11/2019   s/p colectomy   COPD (chronic obstructive pulmonary disease) (HCC)    Coronary artery disease    Diverticulitis    Diverticulosis    GERD (gastroesophageal reflux disease)    Glaucoma    Heart failure, type unknown (HCC) 03/11/2017   History of radiation therapy 08/05/19- 09/03/19   Right Breast/ SCV 16 fractions of 2.66 Gy each for a total of 42.56 Gy. Right breast boost 4 fractions of 2 Gy each to total 8 Gy.    Hyperlipidemia 03/11/2017   Hypertension    Hypothyroidism    Macular degeneration    wet and dry per pt's son   Personal history of chemotherapy    Personal history of radiation therapy    Pneumonia X 1   "years and years ago" (03/26/2017)   Shortness of breath 03/11/2017   Status post dilation of esophageal narrowing    Past Surgical History:  Procedure Laterality Date   BILATERAL OOPHORECTOMY Bilateral    BREAST LUMPECTOMY Right 06/19/2019   BREAST LUMPECTOMY WITH RADIOACTIVE SEED AND SENTINEL LYMPH NODE BIOPSY Right 06/19/2019   Procedure: RIGHT BREAST LUMPECTOMY WITH RADIOACTIVE SEED AND SENTINEL LYMPH NODE BIOPSY;  Surgeon: Caralyn Chandler, MD;  Location: [facility] OR;  Service: General;  Laterality: Right;   CATARACT EXTRACTION      CATARACT EXTRACTION W/ INTRAOCULAR LENS  IMPLANT, BILATERAL Bilateral    COLOSTOMY     Greater than 10 yrs   CORONARY ANGIOPLASTY WITH STENT PLACEMENT  03/26/2017   LAPAROSCOPIC RIGHT COLECTOMY Right 12/15/2019   Procedure: LAPAROSCOPIC ASSISTED RIGHT COLECTOMY;  Surgeon: Caralyn Chandler, MD;  Location: [facility] OR;  Service: General;  Laterality: Right;   LEFT HEART CATH AND CORONARY ANGIOGRAPHY N/A 03/26/2017   Procedure: Left Heart Cath and Coronary Angiography;  Surgeon: Arty Binning, MD;  Location: [facility] INVASIVE CV LAB;  Service: Cardiovascular;  Laterality: N/A;   NERVE SURGERY     "lump on my sciatic nerve was removed years ago"   SHOULDER OPEN ROTATOR CUFF REPAIR Right    THYROIDECTOMY, PARTIAL     TONSILLECTOMY     UPPER GASTROINTESTINAL ENDOSCOPY     >  ten yrs   FAMILY HISTORY Family History  Problem Relation Age of Onset   CAD Mother    Cataracts Mother    Coronary artery disease Father    Leukemia Sister    Amblyopia Neg Hx    Blindness Neg Hx    Diabetes Neg Hx    Glaucoma Neg Hx    Macular degeneration Neg Hx    Retinal detachment Neg Hx    Strabismus Neg Hx    Retinitis pigmentosa Neg Hx    Colon cancer Neg Hx    Colon polyps Neg Hx    Esophageal cancer Neg Hx    Rectal cancer Neg Hx    Stomach cancer Neg Hx    SOCIAL HISTORY Social History   Tobacco Use   Smoking status: Former    Current packs/day: 0.50    Average packs/day: 0.5 packs/day for 60.0 years (30.0 ttl pk-yrs)    Types: Cigarettes   Smokeless tobacco: Never  Vaping Use   Vaping status: Never Used  Substance Use Topics   Alcohol use: Not Currently    Comment: twice a month   Drug use: No       OPHTHALMIC EXAM:  Base Eye Exam     Visual Acuity (Snellen - Linear)       Right Left   Dist [place] CF at 3' CF at 3'   Dist ph [place] NI NI         Tonometry (Tonopen, 12:55 PM)       Right Left   Pressure 12 11         Pupils       Dark Light Shape React APD   Right 3 2 Round Sluggish  None   Left 3 2 Round Sluggish None         Visual Fields       Left Right    Full Full         Extraocular Movement       Right Left    Full, Ortho Full, Ortho         Neuro/Psych     Oriented x3: Yes   Mood/Affect: Normal         Dilation     Both eyes: 1.0% Mydriacyl, 2.5% Phenylephrine  @ 12:54 PM           Slit Lamp and Fundus Exam     Slit Lamp Exam       Right Left   Lids/Lashes Dermatochalasis - upper lid, Telangiectasia, Meibomian gland dysfunction - improving Dermatochalasis - upper lid, Telangiectasia, Meibomian gland dysfunction   Conjunctiva/Sclera White and quiet trace Injection   Cornea Arcus, 2-3+ Punctate epithelial erosions, 3+ fine endo pigment, tear film debris, mild central haze Arcus, 3+ Punctate epithelial erosions, endo pigment / Krunkenberg's spindle, mild central haze   Anterior Chamber Deep and quiet Deep and quiet   Iris Round and dilated Round and dilated   Lens Three piece PC IOL in good position, open PC PC IOL in good position, clear PC   Anterior Vitreous Vitreous syneresis, Posterior vitreous detachment, mild vitreous condensations Mild Vitreous syneresis, Posterior vitreous detachment         Fundus Exam       Right Left   Disc sharp rim, 360 Peripapillary atrophy, 2-3+Pallor 360 Peripapillary atrophy, 2+pallor, sharp rim   C/D Ratio 0.2 0.1   Macula Blunted foveal reflex, Drusen, RPE mottling, clumping and atrophy, +PED, +CNVM, sub-retinal central Disciform scar with subretinal  fibrosis, no heme, central pigment clumping Blunted foveal reflex, Drusen, RPE mottling, clumping, and atrophy, focal GA nasal macula extending into fovea, CNV with +edema temporal to fovea -- improving, early fibrosis, no frank heme   Vessels attenuated, mild tortuosity attenuated, mild tortuosity   Periphery Attached, reticular degeneration, No heme Attached, reticular degeneration, Focal IRH above disc           IMAGING AND PROCEDURES   Imaging and Procedures for @TODAY @  OCT, Retina - OU - Both Eyes       Right Eye Quality was good. Central Foveal Thickness: 321. Progression has been stable. Findings include no IRF, no SRF, abnormal foveal contour, retinal drusen , subretinal hyper-reflective material, disciform scar, epiretinal membrane, pigment epithelial detachment, outer retinal atrophy (persistent thick SRHM/disciform scar -- stable from prior).   Left Eye Quality was good. Central Foveal Thickness: 288. Progression has improved. Findings include no IRF, no SRF, abnormal foveal contour, retinal drusen , subretinal hyper-reflective material, pigment epithelial detachment, outer retinal atrophy (Mild interval improvement in PED/CNV and SRHM / edema overlying, Significant central GA / ORA ).   Notes *Images captured and stored on drive  Diagnosis / Impression:  OD: Exudative AMD with significant submacular scar/SRHM --  persistent thick SRHM/disciform scar -- no IRF/SRF OS: Mild interval improvement in PED/CNV and SRHM / edema overlying, Significant central GA / ORA   Clinical management:  See below  Abbreviations: NFP - Normal foveal profile. CME - cystoid macular edema. PED - pigment epithelial detachment. IRF - intraretinal fluid. SRF - subretinal fluid. EZ - ellipsoid zone. ERM - epiretinal membrane. ORA - outer retinal atrophy. ORT - outer retinal tubulation. SRHM - subretinal hyper-reflective material       Intravitreal Injection, Pharmacologic Agent - OS - Left Eye       Time Out 10/01/2023. 1:28 PM. Confirmed correct patient, procedure, site, and patient consented.   Anesthesia Topical anesthesia was used. Anesthetic medications included Lidocaine  2%, Proparacaine 0.5%.   Procedure Preparation included 5% betadine  to ocular surface, eyelid speculum. A supplied needle was used.   Injection: 1.25 mg Bevacizumab  1.25mg /0.58ml   Route: Intravitreal, Site: Left Eye   NDC: [id number], Lot: [id number],  Expiration date: 11/06/2023   Post-op Post injection exam found visual acuity of at least counting fingers. The patient tolerated the procedure well. There were no complications. The patient received written and verbal post procedure care education. Post injection medications were not given.            ASSESSMENT/PLAN:    ICD-10-CM   1. 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 Exudative age-related macular degeneration of left eye with active choroidal neovascularization (HCC)  H35.3221 OCT, Retina - OU - Both Eyes    Intravitreal Injection, Pharmacologic Agent - OS - Left Eye    Bevacizumab  (AVASTIN ) SOLN 1.25 mg    3. Posterior vitreous detachment of both eyes  H43.813     4. Pigmentary glaucoma of both eyes, mild stage  H40.1331     5. Pseudophakia of both eyes  Z96.1     6. Dry eyes  H04.123      1. Exudative age related macular degeneration, OD -- inactive CNV / scar  - onset ~2 mos prior to presentation, waited for scheduled appt with Dr. Reyne Cave  - S/P IVA OD #1 (05.20.19), #2 (07.02.19), #3 (07.30.19) - S/P IVE OD #1 (08.27.19), #2 (  09.24.19), #3 (11.05.19), #4 (12.17.19), #5 (02.21.20), #6 (05.05.20), # 7 (08.05.20), #8 (9.2.20), #9 (11.10.20), #10 (02.12.21), #11 (05.12.21), #12 (08.13.21)  - VA remains CF - OCT with persistent thick /SRHM/disciform scar centrally  - discussed findings and guarded prognosis and treatment options - due to stable, inactive submacular scar and VA CF -- recommend holding off on injection today  - pt in agreement  2. Exudative age related macular degeneration, left eye  - s/p IVA OS #1 (01.10.25)  - interval conversion from nonexudative ARMD noted on 01.10.25 visit  -- pt was lost to retina f/u from Jan 2024 to Jan 2025  - referred back here by Dr. Carloyn Chi  - BCVA OS: stable at CF 3'  - OCT shows Interval improvement PED/CNV and SRHM/edema overlying,  Significant central GA / ORA - recommend IVA OS #2 today 02.10.25 - pt wishes to proceed with injection - RBA of procedure discussed, questions answered - Avastin  consent obtained, signed, and scanned 01.10.25 - see procedure note   - f/u 4 weeks, DFE, OCT, possible injection  3. PVD / vitreous syneresis OU  Discussed findings and prognosis  No RT or RD on 360 peripheral exam  Reviewed s/s of RT/RD  Strict return precautions for any such RT/RD symptoms  4. Pigmentary glaucoma OU-   - using latanoprost  OU qhs / brimonidine  BID OU  - IOP good today -- 12,11  - monitor  5. Pseudophakia OU  - s/p CE/IOL OU  - beautiful surgery, doing well  - monitor  6. Dry eyes OU  - recommend artificial tears and lubricating ointment as needed  Ophthalmic Meds Ordered this visit:  Meds ordered this encounter  Medications   Bevacizumab  (AVASTIN ) SOLN 1.25 mg     Return in about 4 weeks (around 10/29/2023) for f/u exu ARMD OU, DFE, OCT, Possible Injxn.  There are no Patient Instructions on file for this visit.  This document serves as a record of services personally performed by Jeanice Millard, MD, PhD. It was created on their behalf by Morley Arabia. Bevin Bucks, OA an ophthalmic technician. The creation of this record is the provider's dictation and/or activities during the visit.    Electronically signed by: Morley Arabia. Bevin Bucks, OA 10/01/23 1:42 PM  Jeanice Millard, M.D., Ph.D. Diseases & Surgery of the Retina and Vitreous [facility] 10/01/2023   I have reviewed the above documentation for accuracy and completeness, and I agree with the above. Jeanice Millard, M.D., Ph.D. 10/01/23 1:42 PM   Abbreviations: M myopia (nearsighted); A astigmatism; H hyperopia (farsighted); P presbyopia; Mrx spectacle prescription;  CTL contact lenses; OD right eye; OS left eye; OU both eyes  XT exotropia; ET esotropia; PEK punctate epithelial keratitis; PEE punctate epithelial erosions; DES dry  eye syndrome; MGD meibomian gland dysfunction; ATs artificial tears; PFAT's preservative free artificial tears; NSC nuclear sclerotic cataract; PSC posterior subcapsular cataract; ERM epi-retinal membrane; PVD posterior vitreous detachment; RD retinal detachment; DM diabetes mellitus; DR diabetic retinopathy; NPDR non-proliferative diabetic retinopathy; PDR proliferative diabetic retinopathy; CSME clinically significant macular edema; DME diabetic macular edema; dbh dot blot hemorrhages; CWS cotton wool spot; POAG primary open angle glaucoma; C/D cup-to-disc ratio; HVF humphrey visual field; GVF goldmann visual field; OCT optical coherence tomography; IOP intraocular pressure; BRVO Branch retinal vein occlusion; CRVO central retinal vein occlusion; CRAO central retinal artery occlusion; BRAO branch retinal artery occlusion; RT retinal tear; SB scleral buckle; PPV pars plana vitrectomy; VH Vitreous hemorrhage; PRP panretinal laser  photocoagulation; IVK intravitreal kenalog; VMT vitreomacular traction; MH Macular hole;  NVD neovascularization of the disc; NVE neovascularization elsewhere; AREDS age related eye disease study; ARMD age related macular degeneration; POAG primary open angle glaucoma; EBMD epithelial/anterior basement membrane dystrophy; ACIOL anterior chamber intraocular lens; IOL intraocular lens; PCIOL posterior chamber intraocular lens; Phaco/IOL phacoemulsification with intraocular lens placement; PRK photorefractive keratectomy; LASIK laser assisted in situ keratomileusis; HTN hypertension; DM diabetes mellitus; COPD chronic obstructive pulmonary disease

## 2023-09-28 NOTE — Progress Notes (Incomplete)
  Marissa Caldwell is here today for follow up post radiation to the lung and to receive review CT scan results. She completed SBRT on 01/04/2023  Lung Side: Right  Does the patient complain of any of the following: Pain:*** Shortness of breath w/wo exertion: *** Cough: *** Hemoptysis: *** Pain with swallowing: *** Swallowing/choking concerns: *** Appetite: *** Energy Level: *** Post radiation skin Changes: ***    Additional comments if applicable:

## 2023-10-01 ENCOUNTER — Other Ambulatory Visit: Payer: Self-pay | Admitting: Radiology

## 2023-10-01 ENCOUNTER — Encounter (INDEPENDENT_AMBULATORY_CARE_PROVIDER_SITE_OTHER): Payer: Self-pay | Admitting: Ophthalmology

## 2023-10-01 ENCOUNTER — Ambulatory Visit (INDEPENDENT_AMBULATORY_CARE_PROVIDER_SITE_OTHER): Payer: Medicare Other | Admitting: Ophthalmology

## 2023-10-01 DIAGNOSIS — Z961 Presence of intraocular lens: Secondary | ICD-10-CM

## 2023-10-01 DIAGNOSIS — H401331 Pigmentary glaucoma, bilateral, mild stage: Secondary | ICD-10-CM | POA: Diagnosis not present

## 2023-10-01 DIAGNOSIS — H353213 Exudative age-related macular degeneration, right eye, with inactive scar: Secondary | ICD-10-CM

## 2023-10-01 DIAGNOSIS — H353233 Exudative age-related macular degeneration, bilateral, with inactive scar: Secondary | ICD-10-CM | POA: Diagnosis not present

## 2023-10-01 DIAGNOSIS — C3411 Malignant neoplasm of upper lobe, right bronchus or lung: Secondary | ICD-10-CM

## 2023-10-01 DIAGNOSIS — H353221 Exudative age-related macular degeneration, left eye, with active choroidal neovascularization: Secondary | ICD-10-CM

## 2023-10-01 DIAGNOSIS — H43813 Vitreous degeneration, bilateral: Secondary | ICD-10-CM | POA: Diagnosis not present

## 2023-10-01 DIAGNOSIS — H04123 Dry eye syndrome of bilateral lacrimal glands: Secondary | ICD-10-CM

## 2023-10-01 MED ORDER — BEVACIZUMAB CHEMO INJECTION 1.25MG/0.05ML SYRINGE FOR KALEIDOSCOPE
1.2500 mg | INTRAVITREAL | Status: AC | PRN
Start: 2023-10-01 — End: 2023-10-01
  Administered 2023-10-01: 1.25 mg via INTRAVITREAL

## 2023-10-02 ENCOUNTER — Ambulatory Visit (HOSPITAL_COMMUNITY)
Admission: RE | Admit: 2023-10-02 | Discharge: 2023-10-02 | Disposition: A | Discharge status: Home / Self Care | Payer: Medicare Other | Source: Ambulatory Visit | Attending: Radiology | Admitting: Radiology

## 2023-10-02 ENCOUNTER — Encounter (HOSPITAL_COMMUNITY): Payer: Self-pay

## 2023-10-02 DIAGNOSIS — C3411 Malignant neoplasm of upper lobe, right bronchus or lung: Secondary | ICD-10-CM | POA: Diagnosis not present

## 2023-10-02 DIAGNOSIS — R918 Other nonspecific abnormal finding of lung field: Secondary | ICD-10-CM | POA: Diagnosis not present

## 2023-10-02 DIAGNOSIS — I3139 Other pericardial effusion (noninflammatory): Secondary | ICD-10-CM | POA: Diagnosis not present

## 2023-10-02 DIAGNOSIS — C189 Malignant neoplasm of colon, unspecified: Secondary | ICD-10-CM | POA: Diagnosis not present

## 2023-10-08 ENCOUNTER — Other Ambulatory Visit: Payer: Self-pay

## 2023-10-08 ENCOUNTER — Emergency Department (HOSPITAL_COMMUNITY): Payer: Medicare Other

## 2023-10-08 ENCOUNTER — Inpatient Hospital Stay (HOSPITAL_COMMUNITY)
Admission: EM | Admit: 2023-10-08 | Discharge: 2023-10-14 | DRG: 193 | Disposition: A | Discharge status: Home Health | Payer: Medicare Other | Attending: Internal Medicine | Admitting: Internal Medicine

## 2023-10-08 ENCOUNTER — Encounter (HOSPITAL_COMMUNITY): Payer: Self-pay

## 2023-10-08 DIAGNOSIS — Z9981 Dependence on supplemental oxygen: Secondary | ICD-10-CM

## 2023-10-08 DIAGNOSIS — R079 Chest pain, unspecified: Secondary | ICD-10-CM | POA: Diagnosis not present

## 2023-10-08 DIAGNOSIS — J189 Pneumonia, unspecified organism: Secondary | ICD-10-CM | POA: Diagnosis not present

## 2023-10-08 DIAGNOSIS — Z87891 Personal history of nicotine dependence: Secondary | ICD-10-CM

## 2023-10-08 DIAGNOSIS — G9341 Metabolic encephalopathy: Secondary | ICD-10-CM | POA: Diagnosis not present

## 2023-10-08 DIAGNOSIS — R443 Hallucinations, unspecified: Secondary | ICD-10-CM | POA: Diagnosis present

## 2023-10-08 DIAGNOSIS — Z1152 Encounter for screening for COVID-19: Secondary | ICD-10-CM

## 2023-10-08 DIAGNOSIS — E039 Hypothyroidism, unspecified: Secondary | ICD-10-CM | POA: Diagnosis present

## 2023-10-08 DIAGNOSIS — Z955 Presence of coronary angioplasty implant and graft: Secondary | ICD-10-CM

## 2023-10-08 DIAGNOSIS — I110 Hypertensive heart disease with heart failure: Secondary | ICD-10-CM | POA: Diagnosis present

## 2023-10-08 DIAGNOSIS — J440 Chronic obstructive pulmonary disease with acute lower respiratory infection: Secondary | ICD-10-CM | POA: Diagnosis not present

## 2023-10-08 DIAGNOSIS — Z7951 Long term (current) use of inhaled steroids: Secondary | ICD-10-CM

## 2023-10-08 DIAGNOSIS — Z961 Presence of intraocular lens: Secondary | ICD-10-CM | POA: Diagnosis present

## 2023-10-08 DIAGNOSIS — I5032 Chronic diastolic (congestive) heart failure: Secondary | ICD-10-CM | POA: Diagnosis not present

## 2023-10-08 DIAGNOSIS — J121 Respiratory syncytial virus pneumonia: Secondary | ICD-10-CM | POA: Diagnosis not present

## 2023-10-08 DIAGNOSIS — E785 Hyperlipidemia, unspecified: Secondary | ICD-10-CM | POA: Diagnosis present

## 2023-10-08 DIAGNOSIS — K219 Gastro-esophageal reflux disease without esophagitis: Secondary | ICD-10-CM | POA: Diagnosis not present

## 2023-10-08 DIAGNOSIS — Z933 Colostomy status: Secondary | ICD-10-CM | POA: Diagnosis not present

## 2023-10-08 DIAGNOSIS — J9621 Acute and chronic respiratory failure with hypoxia: Secondary | ICD-10-CM | POA: Diagnosis present

## 2023-10-08 DIAGNOSIS — Z66 Do not resuscitate: Secondary | ICD-10-CM | POA: Diagnosis present

## 2023-10-08 DIAGNOSIS — I10 Essential (primary) hypertension: Secondary | ICD-10-CM | POA: Diagnosis not present

## 2023-10-08 DIAGNOSIS — I2510 Atherosclerotic heart disease of native coronary artery without angina pectoris: Secondary | ICD-10-CM | POA: Diagnosis not present

## 2023-10-08 DIAGNOSIS — Z79890 Hormone replacement therapy (postmenopausal): Secondary | ICD-10-CM | POA: Diagnosis not present

## 2023-10-08 DIAGNOSIS — Z9841 Cataract extraction status, right eye: Secondary | ICD-10-CM

## 2023-10-08 DIAGNOSIS — H548 Legal blindness, as defined in USA: Secondary | ICD-10-CM | POA: Diagnosis not present

## 2023-10-08 DIAGNOSIS — Z9221 Personal history of antineoplastic chemotherapy: Secondary | ICD-10-CM

## 2023-10-08 DIAGNOSIS — J984 Other disorders of lung: Secondary | ICD-10-CM | POA: Diagnosis not present

## 2023-10-08 DIAGNOSIS — R0600 Dyspnea, unspecified: Secondary | ICD-10-CM | POA: Diagnosis not present

## 2023-10-08 DIAGNOSIS — Z8249 Family history of ischemic heart disease and other diseases of the circulatory system: Secondary | ICD-10-CM

## 2023-10-08 DIAGNOSIS — B338 Other specified viral diseases: Secondary | ICD-10-CM | POA: Diagnosis not present

## 2023-10-08 DIAGNOSIS — R059 Cough, unspecified: Secondary | ICD-10-CM | POA: Diagnosis not present

## 2023-10-08 DIAGNOSIS — Z79899 Other long term (current) drug therapy: Secondary | ICD-10-CM

## 2023-10-08 DIAGNOSIS — Z85038 Personal history of other malignant neoplasm of large intestine: Secondary | ICD-10-CM

## 2023-10-08 DIAGNOSIS — J449 Chronic obstructive pulmonary disease, unspecified: Secondary | ICD-10-CM | POA: Diagnosis not present

## 2023-10-08 DIAGNOSIS — Z888 Allergy status to other drugs, medicaments and biological substances status: Secondary | ICD-10-CM

## 2023-10-08 DIAGNOSIS — J479 Bronchiectasis, uncomplicated: Secondary | ICD-10-CM | POA: Diagnosis not present

## 2023-10-08 DIAGNOSIS — F419 Anxiety disorder, unspecified: Secondary | ICD-10-CM | POA: Diagnosis present

## 2023-10-08 DIAGNOSIS — Z79810 Long term (current) use of selective estrogen receptor modulators (SERMs): Secondary | ICD-10-CM

## 2023-10-08 DIAGNOSIS — E871 Hypo-osmolality and hyponatremia: Secondary | ICD-10-CM | POA: Diagnosis present

## 2023-10-08 DIAGNOSIS — G8929 Other chronic pain: Secondary | ICD-10-CM | POA: Diagnosis present

## 2023-10-08 DIAGNOSIS — Z806 Family history of leukemia: Secondary | ICD-10-CM

## 2023-10-08 DIAGNOSIS — H9190 Unspecified hearing loss, unspecified ear: Secondary | ICD-10-CM | POA: Diagnosis present

## 2023-10-08 DIAGNOSIS — C3411 Malignant neoplasm of upper lobe, right bronchus or lung: Secondary | ICD-10-CM | POA: Diagnosis present

## 2023-10-08 DIAGNOSIS — R0602 Shortness of breath: Secondary | ICD-10-CM | POA: Diagnosis not present

## 2023-10-08 DIAGNOSIS — C3490 Malignant neoplasm of unspecified part of unspecified bronchus or lung: Secondary | ICD-10-CM | POA: Diagnosis not present

## 2023-10-08 DIAGNOSIS — J9811 Atelectasis: Secondary | ICD-10-CM | POA: Diagnosis not present

## 2023-10-08 DIAGNOSIS — Z9049 Acquired absence of other specified parts of digestive tract: Secondary | ICD-10-CM

## 2023-10-08 DIAGNOSIS — E876 Hypokalemia: Secondary | ICD-10-CM | POA: Diagnosis present

## 2023-10-08 DIAGNOSIS — Z9842 Cataract extraction status, left eye: Secondary | ICD-10-CM

## 2023-10-08 DIAGNOSIS — Z90722 Acquired absence of ovaries, bilateral: Secondary | ICD-10-CM

## 2023-10-08 DIAGNOSIS — Z923 Personal history of irradiation: Secondary | ICD-10-CM

## 2023-10-08 DIAGNOSIS — C50919 Malignant neoplasm of unspecified site of unspecified female breast: Secondary | ICD-10-CM | POA: Diagnosis not present

## 2023-10-08 LAB — CBC WITH DIFFERENTIAL/PLATELET
Abs Immature Granulocytes: 0.07 10*3/uL (ref 0.00–0.07)
Basophils Absolute: 0 10*3/uL (ref 0.0–0.1)
Basophils Relative: 0 %
Eosinophils Absolute: 0 10*3/uL (ref 0.0–0.5)
Eosinophils Relative: 0 %
HCT: 37.1 % (ref 36.0–46.0)
Hemoglobin: 12.6 g/dL (ref 12.0–15.0)
Immature Granulocytes: 1 %
Lymphocytes Relative: 4 %
Lymphs Abs: 0.5 10*3/uL — ABNORMAL LOW (ref 0.7–4.0)
MCH: 32.1 pg (ref 26.0–34.0)
MCHC: 34 g/dL (ref 30.0–36.0)
MCV: 94.6 fL (ref 80.0–100.0)
Monocytes Absolute: 1.1 10*3/uL — ABNORMAL HIGH (ref 0.1–1.0)
Monocytes Relative: 8 %
Neutro Abs: 11.6 10*3/uL — ABNORMAL HIGH (ref 1.7–7.7)
Neutrophils Relative %: 87 %
Platelets: 231 10*3/uL (ref 150–400)
RBC: 3.92 MIL/uL (ref 3.87–5.11)
RDW: 14.6 % (ref 11.5–15.5)
WBC: 13.3 10*3/uL — ABNORMAL HIGH (ref 4.0–10.5)
nRBC: 0 % (ref 0.0–0.2)

## 2023-10-08 LAB — COMPREHENSIVE METABOLIC PANEL
ALT: 14 U/L (ref 0–44)
AST: 24 U/L (ref 15–41)
Albumin: 3.2 g/dL — ABNORMAL LOW (ref 3.5–5.0)
Alkaline Phosphatase: 43 U/L (ref 38–126)
Anion gap: 13 (ref 5–15)
BUN: 20 mg/dL (ref 8–23)
CO2: 21 mmol/L — ABNORMAL LOW (ref 22–32)
Calcium: 8.4 mg/dL — ABNORMAL LOW (ref 8.9–10.3)
Chloride: 100 mmol/L (ref 98–111)
Creatinine, Ser: 0.53 mg/dL (ref 0.44–1.00)
GFR, Estimated: >60 mL/min (ref >60)
Glucose, Bld: 123 mg/dL — ABNORMAL HIGH (ref 70–99)
Potassium: 3.3 mmol/L — ABNORMAL LOW (ref 3.5–5.1)
Sodium: 134 mmol/L — ABNORMAL LOW (ref 135–145)
Total Bilirubin: 0.8 mg/dL (ref 0.0–1.2)
Total Protein: 6.6 g/dL (ref 6.5–8.1)

## 2023-10-08 LAB — TROPONIN I (HIGH SENSITIVITY)
Troponin I (High Sensitivity): 31 ng/L — ABNORMAL HIGH (ref <18)
Troponin I (High Sensitivity): 35 ng/L — ABNORMAL HIGH (ref <18)

## 2023-10-08 LAB — RESP PANEL BY RT-PCR (RSV, FLU A&B, COVID)  RVPGX2
Influenza A by PCR: NEGATIVE
Influenza B by PCR: NEGATIVE
Resp Syncytial Virus by PCR: NEGATIVE
SARS Coronavirus 2 by RT PCR: NEGATIVE

## 2023-10-08 LAB — I-STAT CG4 LACTIC ACID, ED
Lactic Acid, Venous: 1.6 mmol/L (ref 0.5–1.9)
Lactic Acid, Venous: 1 mmol/L (ref 0.5–1.9)

## 2023-10-08 LAB — BRAIN NATRIURETIC PEPTIDE: B Natriuretic Peptide: 106.9 pg/mL — ABNORMAL HIGH (ref 0.0–100.0)

## 2023-10-08 MED ORDER — LEVOTHYROXINE SODIUM 100 MCG PO TABS
100.0000 ug | ORAL_TABLET | Freq: Every day | ORAL | Status: DC
  Administered 2023-10-09 – 2023-10-14 (×6): 100 ug via ORAL
  Filled 2023-10-08 (×6): qty 1

## 2023-10-08 MED ORDER — ACETAMINOPHEN 325 MG PO TABS
650.0000 mg | ORAL_TABLET | Freq: Four times a day (QID) | ORAL | Status: DC | PRN
  Administered 2023-10-08: 650 mg via ORAL
  Filled 2023-10-08: qty 2

## 2023-10-08 MED ORDER — SODIUM CHLORIDE 0.9 % IV SOLN
1.0000 g | INTRAVENOUS | Status: DC
  Administered 2023-10-09 – 2023-10-11 (×3): 1 g via INTRAVENOUS
  Filled 2023-10-08 (×4): qty 10

## 2023-10-08 MED ORDER — DICLOFENAC POTASSIUM 50 MG PO TABS: 50.0000 mg | ORAL_TABLET | Freq: Every day | ORAL | Status: DC

## 2023-10-08 MED ORDER — ONDANSETRON HCL 4 MG PO TABS: 4.0000 mg | ORAL_TABLET | Freq: Four times a day (QID) | ORAL | Status: DC | PRN

## 2023-10-08 MED ORDER — LORAZEPAM 0.5 MG PO TABS: 0.2500 mg | ORAL_TABLET | Freq: Three times a day (TID) | ORAL | Status: DC | PRN

## 2023-10-08 MED ORDER — MOMETASONE FURO-FORMOTEROL FUM 200-5 MCG/ACT IN AERO
2.0000 | INHALATION_SPRAY | Freq: Two times a day (BID) | RESPIRATORY_TRACT | Status: DC
  Administered 2023-10-08 – 2023-10-12 (×8): 2 via RESPIRATORY_TRACT
  Filled 2023-10-08: qty 8.8

## 2023-10-08 MED ORDER — SODIUM CHLORIDE 0.9 % IV SOLN
1.0000 g | Freq: Once | INTRAVENOUS | Status: AC
  Administered 2023-10-08: 1 g via INTRAVENOUS
  Filled 2023-10-08: qty 10

## 2023-10-08 MED ORDER — PRAVASTATIN SODIUM 40 MG PO TABS
40.0000 mg | ORAL_TABLET | Freq: Every day | ORAL | Status: DC
  Administered 2023-10-08 – 2023-10-14 (×7): 40 mg via ORAL
  Filled 2023-10-08 (×7): qty 1

## 2023-10-08 MED ORDER — IBUPROFEN 400 MG PO TABS
400.0000 mg | ORAL_TABLET | Freq: Every day | ORAL | Status: DC
  Administered 2023-10-09 – 2023-10-14 (×6): 400 mg via ORAL
  Filled 2023-10-08 (×6): qty 1

## 2023-10-08 MED ORDER — IOHEXOL 350 MG/ML SOLN
75.0000 mL | Freq: Once | INTRAVENOUS | Status: AC | PRN
  Administered 2023-10-08: 75 mL via INTRAVENOUS

## 2023-10-08 MED ORDER — ONDANSETRON HCL 4 MG/2ML IJ SOLN: 4.0000 mg | Freq: Four times a day (QID) | INTRAMUSCULAR | Status: DC | PRN

## 2023-10-08 MED ORDER — TAMOXIFEN CITRATE 10 MG PO TABS
20.0000 mg | ORAL_TABLET | Freq: Every day | ORAL | Status: DC
  Administered 2023-10-08 – 2023-10-14 (×7): 20 mg via ORAL
  Filled 2023-10-08 (×7): qty 2

## 2023-10-08 MED ORDER — TRAMADOL HCL 50 MG PO TABS: 25.0000 mg | ORAL_TABLET | Freq: Three times a day (TID) | ORAL | Status: DC | PRN

## 2023-10-08 MED ORDER — ENOXAPARIN SODIUM 30 MG/0.3ML IJ SOSY
30.0000 mg | PREFILLED_SYRINGE | INTRAMUSCULAR | Status: DC
  Administered 2023-10-08 – 2023-10-13 (×6): 30 mg via SUBCUTANEOUS
  Filled 2023-10-08 (×6): qty 0.3

## 2023-10-08 MED ORDER — ADULT MULTIVITAMIN W/MINERALS CH
1.0000 | ORAL_TABLET | Freq: Every day | ORAL | Status: DC
  Administered 2023-10-08 – 2023-10-14 (×7): 1 via ORAL
  Filled 2023-10-08 (×7): qty 1

## 2023-10-08 MED ORDER — DOXYCYCLINE HYCLATE 100 MG PO TABS
100.0000 mg | ORAL_TABLET | Freq: Once | ORAL | Status: AC
  Administered 2023-10-08: 100 mg via ORAL
  Filled 2023-10-08: qty 1

## 2023-10-08 MED ORDER — FAMOTIDINE 20 MG PO TABS
20.0000 mg | ORAL_TABLET | Freq: Every day | ORAL | Status: DC
  Administered 2023-10-08 – 2023-10-14 (×7): 20 mg via ORAL
  Filled 2023-10-08 (×7): qty 1

## 2023-10-08 MED ORDER — POTASSIUM CHLORIDE CRYS ER 20 MEQ PO TBCR
40.0000 meq | EXTENDED_RELEASE_TABLET | Freq: Once | ORAL | Status: AC
  Administered 2023-10-08: 40 meq via ORAL
  Filled 2023-10-08: qty 2

## 2023-10-08 MED ORDER — FESOTERODINE FUMARATE ER 4 MG PO TB24
4.0000 mg | ORAL_TABLET | Freq: Every day | ORAL | Status: DC
  Administered 2023-10-08 – 2023-10-14 (×7): 4 mg via ORAL
  Filled 2023-10-08 (×7): qty 1

## 2023-10-08 MED ORDER — TRAMADOL HCL 50 MG PO TABS
50.0000 mg | ORAL_TABLET | Freq: Four times a day (QID) | ORAL | Status: DC
  Administered 2023-10-08 – 2023-10-12 (×15): 50 mg via ORAL
  Filled 2023-10-08 (×16): qty 1

## 2023-10-08 MED ORDER — ACETAMINOPHEN 650 MG RE SUPP: 650.0000 mg | Freq: Four times a day (QID) | RECTAL | Status: DC | PRN

## 2023-10-08 MED ORDER — LATANOPROST 0.005 % OP SOLN
1.0000 [drp] | Freq: Every day | OPHTHALMIC | Status: DC
  Administered 2023-10-08 – 2023-10-13 (×6): 1 [drp] via OPHTHALMIC
  Filled 2023-10-08: qty 2.5

## 2023-10-08 MED ORDER — IPRATROPIUM-ALBUTEROL 0.5-2.5 (3) MG/3ML IN SOLN: 3.0000 mL | Freq: Four times a day (QID) | RESPIRATORY_TRACT | Status: DC | PRN

## 2023-10-08 MED ORDER — SENNOSIDES-DOCUSATE SODIUM 8.6-50 MG PO TABS: 1.0000 | ORAL_TABLET | Freq: Every evening | ORAL | Status: DC | PRN

## 2023-10-08 MED ORDER — DOXYCYCLINE HYCLATE 100 MG PO TABS
100.0000 mg | ORAL_TABLET | Freq: Two times a day (BID) | ORAL | Status: AC
Start: 2023-10-09 — End: 2023-10-13
  Administered 2023-10-09 – 2023-10-12 (×8): 100 mg via ORAL
  Filled 2023-10-08 (×8): qty 1

## 2023-10-08 NOTE — ED Provider Notes (Signed)
89 yo female presenting with cough x 3 weeks Hx of COPD and lung cancer On baseline home 2L [place] at this time Covid/flu negative Pending 2nd trop and CT PE study  Physical Exam  BP (!) 145/68 (BP Location: Right Arm)   Pulse 78   Temp 100.2 F (37.9 C) (Oral)   Resp 16   SpO2 95%   Physical Exam  Procedures  Procedures  ED Course / MDM    Medical Decision Making Amount and/or Complexity of Data Reviewed Labs: ordered. Radiology: ordered.  Risk Prescription drug management. Decision regarding hospitalization.   CT study reviewed with no acute PE, there is significant mucous plugging, known malignancy noted on CT.  With the patient's white blood cell count elevation and her shortness of breath, I think it is reasonable to treat for community pneumonia and have ordered IV Rocephin and doxycycline.  Given her age and high comorbidity and risk factors, she will be admitted to the hospital, stable on 2 L nasal cannula, for monitoring and treatment of pneumonia.  The patient is comfortable with this plan.       Terald Sleeper, MD 10/08/23 [phone]

## 2023-10-08 NOTE — H&P (Addendum)
History and Physical  YITTA GONGAWARE ZOX:[id number] DOB: Feb 09, 1932 DOA: 10/08/2023  PCP: Laurann Montana, MD   Chief Complaint: Shortness of breath, cough  HPI: Marissa Caldwell is a 89 y.o. female with medical history significant for CAD s/p PCI, legally blind, GERD, colon cancer s/p colectomy, breast cancer, lung cancer s/p radiation, HTN, HLD, hypothyroidism, COPD and anxiety who presents to the ED for evaluation of shortness of breath and cough.  Patient reports she has had a cough for sometimes however this morning, she just could not catch her breath.  Cough has also become productive with greenish sputum.  Reports she was previously placed on home oxygen during the day then it was switched to nightly however when she weaned herself off over the last few weeks.  When she woke up today, she just could not breathe and had mild chest discomfort.  She endorsed a headache but denies any fevers, chills, dysuria, hematuria, falls, dizziness, abdominal pain, nausea or vomiting.  States she lives alone with her dog but has a neighbor that helps her frequently.  ED Course: Initial vitals overall stable but shows temp of 100.0 and SpO2 of 95% on 2 L.  Labs significant for leukocytosis of 13.3, BNP 106.9, sodium 134, K+ 3.3, normal creatinine, troponin 31-35, lactic acid 1.6, negative COVID, flu and RSV test. CXR shows stable minimal patchy density in the left midlung zone concerning for minimal pneumonia or focal inflammatory changes.  CTA chest PE study negative for PE but shows mucous plugging and several peripheral left lower lobe bronchi.  Patient was given oral doxycycline and IV Rocephin.  TRH was consulted for admission  Review of Systems: Please see HPI for pertinent positives and negatives. A complete 10 system review of systems are otherwise negative.  Past Medical History:  Diagnosis Date   Anxiety    Arthritis    Breast cancer (HCC)    CAD (coronary artery disease)    a. 03/2017: 95% LCx  stenosis --> PCI/DES placed   Cataract    Chest pain 03/27/2017   Chronic diastolic heart failure (HCC) 06/25/2017   Chronic lower back pain    Colon cancer (HCC) 11/2019   s/p colectomy   COPD (chronic obstructive pulmonary disease) (HCC)    Coronary artery disease    Diverticulitis    Diverticulosis    GERD (gastroesophageal reflux disease)    Glaucoma    Heart failure, type unknown (HCC) 03/11/2017   History of radiation therapy 08/05/19- 09/03/19   Right Breast/ SCV 16 fractions of 2.66 Gy each for a total of 42.56 Gy. Right breast boost 4 fractions of 2 Gy each to total 8 Gy.    Hyperlipidemia 03/11/2017   Hypertension    Hypothyroidism    Macular degeneration    wet and dry per pt's son   Personal history of chemotherapy    Personal history of radiation therapy    Pneumonia X 1   "years and years ago" (03/26/2017)   Shortness of breath 03/11/2017   Status post dilation of esophageal narrowing    Past Surgical History:  Procedure Laterality Date   BILATERAL OOPHORECTOMY Bilateral    BREAST LUMPECTOMY Right 06/19/2019   BREAST LUMPECTOMY WITH RADIOACTIVE SEED AND SENTINEL LYMPH NODE BIOPSY Right 06/19/2019   Procedure: RIGHT BREAST LUMPECTOMY WITH RADIOACTIVE SEED AND SENTINEL LYMPH NODE BIOPSY;  Surgeon: Griselda Miner, MD;  Location: [facility] OR;  Service: General;  Laterality: Right;   CATARACT EXTRACTION     CATARACT EXTRACTION  W/ INTRAOCULAR LENS  IMPLANT, BILATERAL Bilateral    COLOSTOMY     Greater than 10 yrs   CORONARY ANGIOPLASTY WITH STENT PLACEMENT  03/26/2017   LAPAROSCOPIC RIGHT COLECTOMY Right 12/15/2019   Procedure: LAPAROSCOPIC ASSISTED RIGHT COLECTOMY;  Surgeon: Griselda Miner, MD;  Location: [facility] OR;  Service: General;  Laterality: Right;   LEFT HEART CATH AND CORONARY ANGIOGRAPHY N/A 03/26/2017   Procedure: Left Heart Cath and Coronary Angiography;  Surgeon: Lyn Records, MD;  Location: [facility] INVASIVE CV LAB;  Service: Cardiovascular;  Laterality: N/A;   NERVE SURGERY      "lump on my sciatic nerve was removed years ago"   SHOULDER OPEN ROTATOR CUFF REPAIR Right    THYROIDECTOMY, PARTIAL     TONSILLECTOMY     UPPER GASTROINTESTINAL ENDOSCOPY     > ten yrs   Social History:  reports that she has quit smoking. Her smoking use included cigarettes. She has a 30 pack-year smoking history. She has never used smokeless tobacco. She reports that she does not currently use alcohol. She reports that she does not use drugs.  Allergies  Allergen Reactions   Lipitor [Atorvastatin]     Elevated LFT's    Family History  Problem Relation Age of Onset   CAD Mother    Cataracts Mother    Coronary artery disease Father    Leukemia Sister    Amblyopia Neg Hx    Blindness Neg Hx    Diabetes Neg Hx    Glaucoma Neg Hx    Macular degeneration Neg Hx    Retinal detachment Neg Hx    Strabismus Neg Hx    Retinitis pigmentosa Neg Hx    Colon cancer Neg Hx    Colon polyps Neg Hx    Esophageal cancer Neg Hx    Rectal cancer Neg Hx    Stomach cancer Neg Hx      Prior to Admission medications   Medication Sig Start Date End Date Taking? Authorizing Provider  albuterol (PROVENTIL HFA;VENTOLIN HFA) 108 (90 Base) MCG/ACT inhaler Inhale 2 puffs into the lungs every 6 (six) hours as needed for wheezing or shortness of breath.    [provider]  brimonidine (ALPHAGAN) 0.2 % ophthalmic solution Place 1 drop into the left eye 2 (two) times daily. 03/15/23   Hughie Closs, MD  brimonidine-timolol (COMBIGAN) 0.2-0.5 % ophthalmic solution Apply to eye. 02/20/23   [provider]  budesonide-formoterol (SYMBICORT) 160-4.5 MCG/ACT inhaler Inhale 2 puffs into the lungs 2 (two) times daily.    [provider]  Calcium Carb-Cholecalciferol (CALCIUM + D3) 600-200 MG-UNIT TABS Take 1 tablet by mouth daily.    [provider]  Cholecalciferol (VITAMIN D3) 2000 units capsule Take 4,000 Units by mouth daily.    [provider]  Coenzyme Q10 (COQ10)  100 MG CAPS Take 100 mg by mouth daily.    [provider]  diclofenac (CATAFLAM) 50 MG tablet Take 1 tablet (50 mg total) by mouth daily after breakfast. 04/14/22   Kerrin Champagne, MD  DULERA 200-5 MCG/ACT AERO Inhale 2 puffs into the lungs 2 (two) times daily. 12/05/22   [provider]  famotidine (PEPCID) 20 MG tablet Take 20 mg by mouth daily. 10/19/22   [provider]  gabapentin (NEURONTIN) 100 MG capsule Take 1 capsule (100 mg total) by mouth at bedtime. 12/22/21   Kerrin Champagne, MD  latanoprost (XALATAN) 0.005 % ophthalmic solution Place 1 drop into both eyes at  bedtime.    [provider]  levothyroxine (SYNTHROID, LEVOTHROID) 100 MCG tablet Take 100 mcg by mouth daily before breakfast.  05/17/18   [provider]  LORazepam (ATIVAN) 0.5 MG tablet Take 0.25-0.5 mg by mouth every 8 (eight) hours as needed for anxiety. 10/23/22   [provider]  LUTEIN PO Take 1 tablet by mouth daily.    [provider]  mirtazapine (REMERON) 15 MG tablet Take 15 mg by mouth at bedtime.    [provider]  Multiple Vitamin (MULTIVITAMIN) tablet Take 1 tablet by mouth daily.    [provider]  nitroGLYCERIN (NITROSTAT) 0.4 MG SL tablet Place 1 tablet (0.4 mg total) under the tongue every 5 (five) minutes as needed for chest pain. 01/01/20   Abelino Derrick, PA-C  Omega-3 Fatty Acids (FISH OIL PO) Take 1 capsule by mouth daily.    [provider]  pravastatin (PRAVACHOL) 40 MG tablet TAKE 1 TABLET BY MOUTH DAILY GENERIC EQUIVALENT FOR PRAVACHOL 10/06/20   Chilton Si, MD  solifenacin (VESICARE) 5 MG tablet Take 5 mg by mouth daily. 01/12/23   [provider]  tamoxifen (NOLVADEX) 20 MG tablet TAKE 1 TABLET BY MOUTH ONCE DAILY . APPOINTMENT REQUIRED FOR FUTURE REFILLS 09/15/22   Malachy Mood, MD  TOVIAZ 4 MG TB24 tablet Take 4 mg by mouth daily.  05/20/18   [provider]  traMADol (ULTRAM) 50 MG tablet Take  0.5-1 tablets (25-50 mg total) by mouth every 8 (eight) hours as needed. 09/15/22   Malachy Mood, MD    Physical Exam: BP (!) 120/101   Pulse 84   Temp 100.2 F (37.9 C) (Oral)   Resp (!) 22   SpO2 93%  General: Pleasant, very frail and cachectic woman laying in bed. No acute distress. HEENT: [place]/AT. Anicteric sclera. Legally blind and hard of hearing CV: RRR. No murmurs, rubs, or gallops. No LE edema Pulmonary: On 3 L [place]. Lungs CTAB. Normal effort. Decreased breath sounds throughout. No wheezing or rales. Abdominal: Soft, nontender, nondistended. Normal bowel sounds. Extremities: Palpable radial and DP pulses. Normal ROM. Skin: Warm and dry. No obvious rash or lesions. Neuro: A&Ox3. Moves all extremities. Normal sensation to light touch. No focal deficit. Psych: Normal mood and affect          Labs on Admission:  Basic Metabolic Panel: Recent Labs  Lab 10/08/23 1016  NA 134*  K 3.3*  CL 100  CO2 21*  GLUCOSE 123*  BUN 20  CREATININE 0.53  CALCIUM 8.4*   Liver Function Tests: Recent Labs  Lab 10/08/23 1016  AST 24  ALT 14  ALKPHOS 43  BILITOT 0.8  PROT 6.6  ALBUMIN 3.2*   No results for input(s): "LIPASE", "AMYLASE" in the last 168 hours. No results for input(s): "AMMONIA" in the last 168 hours. CBC: Recent Labs  Lab 10/08/23 0852  WBC 13.3*  NEUTROABS 11.6*  HGB 12.6  HCT 37.1  MCV 94.6  PLT 231   Cardiac Enzymes: No results for input(s): "CKTOTAL", "CKMB", "CKMBINDEX", "TROPONINI" in the last 168 hours. BNP (last 3 results) Recent Labs    03/15/23 0445 03/28/23 1508 10/08/23 0852  BNP 236.9* 140.4* 106.9*    ProBNP (last 3 results) No results for input(s): "PROBNP" in the last 8760 hours.  CBG: No results for input(s): "GLUCAP" in the last 168 hours.  Radiological Exams on Admission: CT Angio Chest PE W and/or Wo Contrast Result Date: 10/08/2023 CLINICAL DATA:  Chest pain and  shortness of breath. Cough. High probability for pulmonary  embolism. Lung carcinoma. * Tracking Code: BO * EXAM: CT ANGIOGRAPHY CHEST WITH CONTRAST TECHNIQUE: Multidetector CT imaging of the chest was performed using the standard protocol during bolus administration of intravenous contrast. Multiplanar CT image reconstructions and MIPs were obtained to evaluate the vascular anatomy. RADIATION DOSE REDUCTION: This exam was performed according to the departmental dose-optimization program which includes automated exposure control, adjustment of the mA and/or kV according to patient size and/or use of iterative reconstruction technique. CONTRAST:  75mL OMNIPAQUE IOHEXOL 350 MG/ML SOLN COMPARISON:  10/02/2023 FINDINGS: Cardiovascular: Satisfactory opacification of pulmonary arteries noted, and no pulmonary emboli identified. No evidence of thoracic aortic dissection or aneurysm. Mediastinum/Nodes: No masses or pathologically enlarged lymph nodes identified. Lungs/Pleura: Ill-defined masslike opacity is again seen in the anterior right upper lobe with adjacent mild bronchiectasis. This measures 3.0 x 2.8 cm on image 42/6, without significant change since previous study. A few sub-centimeter ill-defined nodular opacities in the left upper and lower lobes also unchanged. Increased dependent atelectasis is seen in both lung bases, with mucous plugging noted in several peripheral left lower lobe bronchi. Upper abdomen: No acute findings. Musculoskeletal: No suspicious bone lesions identified. Review of the MIP images confirms the above findings. IMPRESSION: No evidence of pulmonary embolism. Increased dependent atelectasis in both lung bases, with mucous plugging noted in several peripheral left lower lobe bronchi. Stable masslike opacity in anterior right upper lobe. Stable sub-centimeter nodular opacities in left upper and lower lobes. Electronically Signed   By: Danae Orleans M.D.   On: 10/08/2023 16:41   DG Chest Port 1 View Result Date: 10/08/2023 CLINICAL DATA:  Cough and  shortness of breath. Right upper lobe non-small-cell lung cancer. Breast cancer. EXAM: PORTABLE CHEST 1 VIEW COMPARISON:  03/28/2023.  Chest CT dated 10/02/2023 FINDINGS: Right upper lobe mass, posttreatment changes without significant change since 10/02/2023 the lungs remain hyperexpanded. Minimal patchy densities in the mid left lung without significant change since 10/02/2023. No pleural fluid stable mildly enlarged cardiac silhouette and tortuous and partially calcified thoracic aorta. Stable mild-to-moderate thoracolumbar scoliosis and diffuse osteopenia. IMPRESSION: 1. Stable minimal patchy density in the left mid lung zone, possibly representing minimal pneumonia or focal inflammatory changes. 2. Stable hyperexpansion of the lungs compatible with COPD. 3. Stable right upper lobe mass, posttreatment changes. 4. Stable mild cardiomegaly. Electronically Signed   By: Beckie Salts M.D.   On: 10/08/2023 09:05   Assessment/Plan Milta Croson Jaye is a 89 y.o. female with medical history significant for CAD s/p PCI, legally blind, GERD, colon cancer s/p colectomy, breast cancer, lung cancer s/p radiation, HTN, HLD, hypothyroidism, COPD and anxiety who presents to the ED for evaluation of shortness of breath and cough and admitted for community-acquired pneumonia.  # Community-acquired pneumonia -Patient with history of COPD and lung cancer presenting with progressive cough that is productive as well as 1 day of shortness of breath found to have evidence of pneumonia on chest imaging. CT chest also concerning for mucous plug. COVID, flu and RSV negative. Patient currently requiring 3 L [place] to maintain SpO2 in the mid 90s. She was previously on 3 L but reports weaning herself off. -Continue IV Rocephin and oral doxycycline -Continue supplemental O2, wean as able -Follow-up blood culture, sputum culture -Follow-up MRSA screen, urinary Legionella and strep pneumo -Check full RVP and procalcitonin -Incentive  spirometer, flutter valve  # COPD without acute exacerbation No significant wheezing on exam. -Continue Dulera twice daily -As needed  DuoNebs  # Hypokalemia K+ of 3.3 on admission. -Pleated with KCl 40 mEq x 1 -Follow-up morning BMP, mag and Phos  # Lung cancer CT chest shows there is a 3 x 2.8 cm ill-defined mass in the anterior right upper lobe that is unchanged in size compared to previous imaging of her known lung malignancy. -Follow-up with medical and radiation oncology in the outpatient -Consider palliative care consult during this hospitalization to discuss goals of care  # Hypothyroidism -Continue Synthroid  # HLD -Continue pravastatin  # Breast cancer -Continue tamoxifen  # Chronic pain -Continue diclofenac tablet and as needed tramadol  # Anxiety -Continue as needed Ativan  # GERD -Continue famotidine  DVT prophylaxis: Lovenox     Code Status: Limited: Do not attempt resuscitation (DNR) -DNR-LIMITED -Do Not Intubate/DNI   Consults called: None  Family Communication: No family at bedside  Severity of Illness: The appropriate patient status for this patient is OBSERVATION. Observation status is judged to be reasonable and necessary in order to provide the required intensity of service to ensure the patient's safety. The patient's presenting symptoms, physical exam findings, and initial radiographic and laboratory data in the context of their medical condition is felt to place them at decreased risk for further clinical deterioration. Furthermore, it is anticipated that the patient will be medically stable for discharge from the hospital within 2 midnights of admission.   Level of care:  MedSurg  Steffanie Rainwater, MD 10/08/2023, 5:37 PM [facility] Pager: [phone] Isaiah 41:10   If 7PM-7AM, please contact night-coverage [email or web address] Password [email or web address]

## 2023-10-08 NOTE — ED Provider Notes (Signed)
[place] EMERGENCY DEPARTMENT AT [facility] Provider Note   CSN: [id number] Arrival date & time: 10/08/23  6440     History  Chief Complaint  Patient presents with   Shortness of Breath    Marissa Caldwell is a 89 y.o. female.  89 year old female with prior medical history as detailed below presents for evaluation.  Patient with known history of lung cancer.  She complains of increased shortness of breath and vague chest discomfort today.  She reports that symptoms began approximately 3 days ago.  Cough has been persistent for the last 2 to 3 weeks.  She reports that she felt much worse this morning decided to call EMS.  The history is provided by the patient and medical records.       Home Medications Prior to Admission medications   Medication Sig Start Date End Date Taking? Authorizing Provider  albuterol (PROVENTIL HFA;VENTOLIN HFA) 108 (90 Base) MCG/ACT inhaler Inhale 2 puffs into the lungs every 6 (six) hours as needed for wheezing or shortness of breath.    [provider]  brimonidine (ALPHAGAN) 0.2 % ophthalmic solution Place 1 drop into the left eye 2 (two) times daily. 03/15/23   Hughie Closs, MD  brimonidine-timolol (COMBIGAN) 0.2-0.5 % ophthalmic solution Apply to eye. 02/20/23   [provider]  budesonide-formoterol (SYMBICORT) 160-4.5 MCG/ACT inhaler Inhale 2 puffs into the lungs 2 (two) times daily.    [provider]  Calcium Carb-Cholecalciferol (CALCIUM + D3) 600-200 MG-UNIT TABS Take 1 tablet by mouth daily.    [provider]  Cholecalciferol (VITAMIN D3) 2000 units capsule Take 4,000 Units by mouth daily.    [provider]  Coenzyme Q10 (COQ10) 100 MG CAPS Take 100 mg by mouth daily.    [provider]  diclofenac (CATAFLAM) 50 MG tablet Take 1 tablet (50 mg total) by mouth daily after breakfast. 04/14/22   Kerrin Champagne, MD  DULERA 200-5 MCG/ACT AERO Inhale 2 puffs into the lungs 2 (two)  times daily. 12/05/22   [provider]  famotidine (PEPCID) 20 MG tablet Take 20 mg by mouth daily. 10/19/22   [provider]  gabapentin (NEURONTIN) 100 MG capsule Take 1 capsule (100 mg total) by mouth at bedtime. 12/22/21   Kerrin Champagne, MD  latanoprost (XALATAN) 0.005 % ophthalmic solution Place 1 drop into both eyes at bedtime.    [provider]  levothyroxine (SYNTHROID, LEVOTHROID) 100 MCG tablet Take 100 mcg by mouth daily before breakfast.  05/17/18   [provider]  LORazepam (ATIVAN) 0.5 MG tablet Take 0.25-0.5 mg by mouth every 8 (eight) hours as needed for anxiety. 10/23/22   [provider]  LUTEIN PO Take 1 tablet by mouth daily.    [provider]  mirtazapine (REMERON) 15 MG tablet Take 15 mg by mouth at bedtime.    [provider]  Multiple Vitamin (MULTIVITAMIN) tablet Take 1 tablet by mouth daily.    [provider]  nitroGLYCERIN (NITROSTAT) 0.4 MG SL tablet Place 1 tablet (0.4 mg total) under the tongue every 5 (five) minutes as needed for chest pain. 01/01/20   Abelino Derrick, PA-C  Omega-3 Fatty Acids (FISH OIL PO) Take 1 capsule by mouth daily.    [provider]  pravastatin (PRAVACHOL) 40 MG tablet TAKE 1 TABLET BY MOUTH DAILY GENERIC EQUIVALENT FOR PRAVACHOL 10/06/20   Chilton Si, MD  solifenacin (VESICARE) 5 MG tablet Take 5 mg by mouth daily. 01/12/23  [provider]  tamoxifen (NOLVADEX) 20 MG tablet TAKE 1 TABLET BY MOUTH ONCE DAILY . APPOINTMENT REQUIRED FOR FUTURE REFILLS 09/15/22   Malachy Mood, MD  TOVIAZ 4 MG TB24 tablet Take 4 mg by mouth daily.  05/20/18   [provider]  traMADol (ULTRAM) 50 MG tablet Take 0.5-1 tablets (25-50 mg total) by mouth every 8 (eight) hours as needed. 09/15/22   Malachy Mood, MD      Allergies    Lipitor [atorvastatin]    Review of Systems   Review of Systems  All other systems reviewed and are negative.   Physical Exam Updated  Vital Signs There were no vitals taken for this visit. Physical Exam Vitals and nursing note reviewed.  Constitutional:      General: She is not in acute distress.    Comments: Alert, hard of hearing, frail, cachectic  HENT:     Head: Normocephalic and atraumatic.  Eyes:     Conjunctiva/sclera: Conjunctivae normal.  Cardiovascular:     Rate and Rhythm: Normal rate and regular rhythm.     Heart sounds: No murmur heard. Pulmonary:     Effort: Pulmonary effort is normal. No respiratory distress.     Breath sounds: Normal breath sounds.  Abdominal:     Palpations: Abdomen is soft.     Tenderness: There is no abdominal tenderness.  Musculoskeletal:        General: No swelling.     Cervical back: Neck supple.  Skin:    General: Skin is warm and dry.     Capillary Refill: Capillary refill takes less than 2 seconds.  Neurological:     Mental Status: She is alert.  Psychiatric:        Mood and Affect: Mood normal.     ED Results / Procedures / Treatments   Labs (all labs ordered are listed, but only abnormal results are displayed) Labs Reviewed  RESP PANEL BY RT-PCR (RSV, FLU A&B, COVID)  [id number]  CULTURE, BLOOD (ROUTINE X 2)  CULTURE, BLOOD (ROUTINE X 2)  CBC WITH DIFFERENTIAL/PLATELET  COMPREHENSIVE METABOLIC PANEL  BRAIN NATRIURETIC PEPTIDE  URINALYSIS, W/ REFLEX TO CULTURE (INFECTION SUSPECTED)  I-STAT CG4 LACTIC ACID, ED  TROPONIN I (HIGH SENSITIVITY)    EKG None  Radiology No results found.  Procedures Procedures    Medications Ordered in ED Medications - No data to display  ED Course/ Medical Decision Making/ A&P                                 Medical Decision Making Amount and/or Complexity of Data Reviewed Labs: ordered. Radiology: ordered.  Risk Prescription drug management.    Medical Screen Complete  This patient presented to the ED with complaint of chest pain, shortness of breath.  This complaint involves an extensive number of  treatment options. The initial differential diagnosis includes, but is not limited to, pneumonia, PE, viral infection, metabolic abnormality, etc.  This presentation is: Acute, Chronic, Self-Limited, Previously Undiagnosed, Uncertain Prognosis, Complicated, Systemic Symptoms, and Threat to Life/Bodily Function  Patient is presenting with complaint of shortness of breath and atypical chest pain.  Patient with known history of lung cancer.  CTA ordered and results pending to rule out PE.  Initial screening labs demonstrated white count of 13.  COVID and flu are negative.  BNP is 106.  Troponin is initially 31.  Oncoming EDP is aware of pending studies.  He  is aware of need for disposition.    Co morbidities that complicated the patient's evaluation  See HPI   Additional history obtained: External records from outside sources obtained and reviewed including prior ED visits and prior Inpatient records.    Lab Tests:  I ordered and personally interpreted labs.  The pertinent results include: CBC, CMP, troponin x 2, lactic acid, blood cultures, COVID, flu, BNP   Imaging Studies ordered:  I ordered imaging studies including chest, CTA chest  I agree with the radiologist interpretation.   Cardiac Monitoring:  The patient was maintained on a cardiac monitor.  I personally viewed and interpreted the cardiac monitor which showed an underlying rhythm of: NSR   Problem List / ED Course:  Chest pain, shortness of breath   Reevaluation:  After the interventions noted above, I reevaluated the patient and found that they have: stayed the same    Disposition:  After consideration of the diagnostic results and the patients response to treatment, I feel that the patent would benefit from completion of ED evaluation.          Final Clinical Impression(s) / ED Diagnoses Final diagnoses:  Dyspnea, unspecified type    Rx / DC Orders ED Discharge Orders     None          Wynetta Fines, MD 10/08/23 [phone]

## 2023-10-08 NOTE — Progress Notes (Signed)
Pt received from ED, VS taken and stable with 3L nasal O2. Pt is alert and oriented X4. Oriented to the room, call bell and telephone. Fall mats in place and bed alarm on. No sign of distress noted.

## 2023-10-08 NOTE — ED Triage Notes (Signed)
Pt having CP on and off for a few weeks, hx of lung cancer. C/O [facility] today while working out. Pt wears 3L in morning only and states it did not help. Cough for 3 weeks. Pt had scan last week and found another mass. Pt very nauseous. Pt refused zofran.

## 2023-10-09 ENCOUNTER — Ambulatory Visit: Payer: Medicare Other | Admitting: Radiation Oncology

## 2023-10-09 DIAGNOSIS — J189 Pneumonia, unspecified organism: Secondary | ICD-10-CM | POA: Diagnosis not present

## 2023-10-09 DIAGNOSIS — B338 Other specified viral diseases: Secondary | ICD-10-CM | POA: Diagnosis not present

## 2023-10-09 LAB — URINALYSIS, W/ REFLEX TO CULTURE (INFECTION SUSPECTED)
Bacteria, UA: NONE SEEN
Bilirubin Urine: NEGATIVE
Glucose, UA: NEGATIVE mg/dL
Hgb urine dipstick: NEGATIVE
Ketones, ur: NEGATIVE mg/dL
Leukocytes,Ua: NEGATIVE
Nitrite: NEGATIVE
Protein, ur: 30 mg/dL — AB
Specific Gravity, Urine: 1.017 (ref 1.005–1.030)
pH: 5 (ref 5.0–8.0)

## 2023-10-09 LAB — CBC
HCT: 33.7 % — ABNORMAL LOW (ref 36.0–46.0)
Hemoglobin: 11.5 g/dL — ABNORMAL LOW (ref 12.0–15.0)
MCH: 31.7 pg (ref 26.0–34.0)
MCHC: 34.1 g/dL (ref 30.0–36.0)
MCV: 92.8 fL (ref 80.0–100.0)
Platelets: 183 10*3/uL (ref 150–400)
RBC: 3.63 MIL/uL — ABNORMAL LOW (ref 3.87–5.11)
RDW: 14.4 % (ref 11.5–15.5)
WBC: 10 10*3/uL (ref 4.0–10.5)
nRBC: 0 % (ref 0.0–0.2)

## 2023-10-09 LAB — EXPECTORATED SPUTUM ASSESSMENT W GRAM STAIN, RFLX TO RESP C

## 2023-10-09 LAB — RESPIRATORY PANEL BY PCR

## 2023-10-09 LAB — BASIC METABOLIC PANEL
Anion gap: 12 (ref 5–15)
BUN: 22 mg/dL (ref 8–23)
CO2: 21 mmol/L — ABNORMAL LOW (ref 22–32)
Calcium: 8.2 mg/dL — ABNORMAL LOW (ref 8.9–10.3)
Chloride: 100 mmol/L (ref 98–111)
Creatinine, Ser: 0.68 mg/dL (ref 0.44–1.00)
GFR, Estimated: >60 mL/min (ref >60)
Glucose, Bld: 109 mg/dL — ABNORMAL HIGH (ref 70–99)
Potassium: 3.8 mmol/L (ref 3.5–5.1)
Sodium: 133 mmol/L — ABNORMAL LOW (ref 135–145)

## 2023-10-09 LAB — PHOSPHORUS: Phosphorus: 2.1 mg/dL — ABNORMAL LOW (ref 2.5–4.6)

## 2023-10-09 LAB — PROCALCITONIN: Procalcitonin: 0.16 ng/mL

## 2023-10-09 LAB — MAGNESIUM: Magnesium: 2.1 mg/dL (ref 1.7–2.4)

## 2023-10-09 LAB — MRSA NEXT GEN BY PCR, NASAL: MRSA by PCR Next Gen: NOT DETECTED

## 2023-10-09 MED ORDER — K PHOS MONO-SOD PHOS DI & MONO 155-852-130 MG PO TABS
500.0000 mg | ORAL_TABLET | Freq: Three times a day (TID) | ORAL | Status: AC
  Administered 2023-10-09 – 2023-10-10 (×3): 500 mg via ORAL
  Filled 2023-10-09 (×3): qty 2

## 2023-10-09 MED ORDER — GABAPENTIN 100 MG PO CAPS
100.0000 mg | ORAL_CAPSULE | Freq: Every day | ORAL | Status: DC
  Administered 2023-10-09 – 2023-10-13 (×5): 100 mg via ORAL
  Filled 2023-10-09 (×5): qty 1

## 2023-10-09 MED ORDER — GUAIFENESIN ER 600 MG PO TB12
1200.0000 mg | ORAL_TABLET | Freq: Two times a day (BID) | ORAL | Status: AC
  Administered 2023-10-09 – 2023-10-13 (×10): 1200 mg via ORAL
  Filled 2023-10-09 (×10): qty 2

## 2023-10-09 NOTE — Hospital Course (Signed)
Marissa Caldwell is a 89 y.o. female with a history of CAD s/p PCI, legally blind, GERD, colon cancer s/p right colectomy, breast cancer s/p lumpectomy and radiotherapy, lung cancer s/p radiation, hypertension, hyperlipidemia, hypothyroidism, COPD, anxiety, chronic respiratory failure.  Patient presented secondary to shortness of breath and cough and found to have evidence of pneumonia. Empiric antibiotics started. RSV positive.

## 2023-10-09 NOTE — Plan of Care (Signed)
   Problem: Clinical Measurements: Goal: Respiratory complications will improve Outcome: Progressing   Problem: Pain Managment: Goal: General experience of comfort will improve and/or be controlled Outcome: Progressing   Problem: Safety: Goal: Ability to remain free from injury will improve Outcome: Progressing

## 2023-10-09 NOTE — Care Management Obs Status (Signed)
MEDICARE OBSERVATION STATUS NOTIFICATION   Patient Details  Name: Marissa Caldwell MRN: [id number] Date of Birth: 04-11-1932   Medicare Observation Status Notification Given:  Yes    Kingsley Plan, RN 10/09/2023, 2:47 PM

## 2023-10-09 NOTE — Plan of Care (Signed)
  Problem: Education: Goal: Knowledge of General Education information will improve Description: Including pain rating scale, medication(s)/side effects and non-pharmacologic comfort measures 10/09/2023 1753 by Montez Hageman, RN Outcome: Progressing 10/09/2023 1753 by Montez Hageman, RN Outcome: Progressing   Problem: Health Behavior/Discharge Planning: Goal: Ability to manage health-related needs will improve 10/09/2023 1753 by Montez Hageman, RN Outcome: Progressing 10/09/2023 1753 by Montez Hageman, RN Outcome: Progressing   Problem: Clinical Measurements: Goal: Ability to maintain clinical measurements within normal limits will improve 10/09/2023 1753 by Montez Hageman, RN Outcome: Progressing 10/09/2023 1753 by Montez Hageman, RN Outcome: Progressing Goal: Will remain free from infection 10/09/2023 1753 by Montez Hageman, RN Outcome: Progressing 10/09/2023 1753 by Montez Hageman, RN Outcome: Progressing Goal: Diagnostic test results will improve 10/09/2023 1753 by Montez Hageman, RN Outcome: Progressing 10/09/2023 1753 by Montez Hageman, RN Outcome: Progressing Goal: Respiratory complications will improve 10/09/2023 1753 by Montez Hageman, RN Outcome: Progressing 10/09/2023 1753 by Montez Hageman, RN Outcome: Progressing Goal: Cardiovascular complication will be avoided 10/09/2023 1753 by Montez Hageman, RN Outcome: Progressing 10/09/2023 1753 by Montez Hageman, RN Outcome: Progressing   Problem: Activity: Goal: Risk for activity intolerance will decrease Outcome: Progressing   Problem: Nutrition: Goal: Adequate nutrition will be maintained Outcome: Progressing   Problem: Coping: Goal: Level of anxiety will decrease Outcome: Progressing   Problem: Elimination: Goal: Will not experience complications related to bowel motility Outcome: Progressing Goal: Will not experience complications related to  urinary retention Outcome: Progressing   Problem: Pain Managment: Goal: General experience of comfort will improve and/or be controlled Outcome: Progressing   Problem: Safety: Goal: Ability to remain free from injury will improve Outcome: Progressing   Problem: Skin Integrity: Goal: Risk for impaired skin integrity will decrease Outcome: Progressing   Problem: Activity: Goal: Ability to tolerate increased activity will improve Outcome: Progressing   Problem: Clinical Measurements: Goal: Ability to maintain a body temperature in the normal range will improve Outcome: Progressing   Problem: Respiratory: Goal: Ability to maintain adequate ventilation will improve Outcome: Progressing Goal: Ability to maintain a clear airway will improve Outcome: Progressing

## 2023-10-09 NOTE — Progress Notes (Addendum)
   10/09/23 1451  TOC Brief Assessment  Insurance and Status Reviewed  Patient has primary care physician Yes  Home environment has been reviewed lives alone  Prior level of function: independent  Prior/Current Home Services No current home services  Social Drivers of Health Review SDOH reviewed no interventions necessary  Transition of care needs no transition of care needs at this time   Patient from home alone. Neighbor currently taking care of her dog. Patient has home oxygen already at home, however just uses at night , not during the day and does not have a portable tank. Patient does not know the name of her Oxygen Company but they are in [place]. NCM called Jermaine with [employer], await call back. Patient has home o2 no portable , just nocturnal  with [employer]   Await PT/OT recommendations     Transition of Care Department [facility]) has reviewed patient and   will continue to monitor patient advancement through interdisciplinary progression rounds. If new patient transition needs arise, please place a TOC consult.

## 2023-10-09 NOTE — Progress Notes (Signed)
PROGRESS NOTE    Marissa Caldwell  WUJ:[id number] DOB: 1931-10-27 DOA: 10/08/2023 PCP: Laurann Montana, MD   Brief Narrative: Marissa Caldwell is a 89 y.o. female with a history of CAD s/p PCI, legally blind, GERD, colon cancer s/p right colectomy, breast cancer s/p lumpectomy and radiotherapy, lung cancer s/p radiation, hypertension, hyperlipidemia, hypothyroidism, COPD, anxiety, chronic respiratory failure.  Patient presented secondary to shortness of breath and cough and found to have evidence of pneumonia. Empiric antibiotics started. RSV positive.   Assessment and Plan:  Community acquired pneumonia Patient with symptoms concerning for pneumonia. Imaging significant for atelectasis and mucous plugging in setting of known lung cancer. Procalcitonin low. Patient is high risk for bacterial pneumonia. RSV is positive. Patient started empirically on antibiotics on admission. -Continue Ceftriaxone and doxycycline pending sputum culture data -Follow-up blood cultures, sputum culture -Flutter valve -Mucinex  RSV infection Respiratory virus panel positive for RSV infection. Likely etiology of pneumonia-like symptoms.  -Supportive care  Chronic respiratory failure with hypoxia Patient uses 3 L/min at baseline -Wean to room air as able  COPD without exacerbation No evidence of exacerbation. -Continue Dulera and Duoneb  Hypokalemia Mild. Potassium of 3.3 on admission. Resolved with potassium supplementation.  Hypothyroidism -Continue Synthroid  Hyperlipidemia -Continue pravastatin  History of breast cancer S/p right breast lumpectomy, adjuvant radiotherapy and is currently on Tamoxifen -Continue tamoxifen  History of lung cancer Right upper lobe mass. Patient follows with radiation oncology for radiation therapy.  History of right colon cancer History of right colectomy.  Chronic pain -Continue Tramadol and gabapentin  Anxiety -Continue Ativan as  needed  Hypophosphatemia -phosphorus supplementation  GERD -Continue famotidine   DVT prophylaxis: Lovenox Code Status:   Code Status: Limited: Do not attempt resuscitation (DNR) -DNR-LIMITED -Do Not Intubate/DNI  Family Communication: Son on telephone Disposition Plan: Discharge pending improvement of respiratory status and PT/OT recommendations (patient lives at home alone and uses a walker at baseline)   Consultants:  None  Procedures:  None  Antimicrobials: Ceftriaxone Doxycycline    Subjective: Patient reports not feeling well. Cough. Shortness of breath.  Objective: BP 134/69 (BP Location: Left Arm)   Pulse 89   Temp 98.9 F (37.2 C) (Oral)   Resp 18   SpO2 95%   Examination:  General exam: Appears calm. Chronically ill appearing. Respiratory system: Diminished with mild rales/rhonchi. Respiratory effort normal. Cardiovascular system: S1 & S2 heard, RRR. No murmurs, rubs, gallops or clicks. Gastrointestinal system: Abdomen is nondistended, soft and nontender. Normal bowel sounds heard. Central nervous system: Alert and oriented. No focal neurological deficits. Musculoskeletal: No edema. No calf tenderness Psychiatry: Judgement and insight appear normal. Mood & affect appropriate.    Data Reviewed: I have personally reviewed following labs and imaging studies  CBC Lab Results  Component Value Date   WBC 10.0 10/09/2023   RBC 3.63 (L) 10/09/2023   HGB 11.5 (L) 10/09/2023   HCT 33.7 (L) 10/09/2023   MCV 92.8 10/09/2023   MCH 31.7 10/09/2023   PLT 183 10/09/2023   MCHC 34.1 10/09/2023   RDW 14.4 10/09/2023   LYMPHSABS 0.5 (L) 10/08/2023   MONOABS 1.1 (H) 10/08/2023   EOSABS 0.0 10/08/2023   BASOSABS 0.0 10/08/2023     Last metabolic panel Lab Results  Component Value Date   NA 134 (L) 10/08/2023   K 3.3 (L) 10/08/2023   CL 100 10/08/2023   CO2 21 (L) 10/08/2023   BUN 20 10/08/2023   CREATININE 0.53 10/08/2023   GLUCOSE 123 (  H) 10/08/2023    GFRNONAA >60 10/08/2023   GFRAA >60 04/09/2020   CALCIUM 8.4 (L) 10/08/2023   PROT 6.6 10/08/2023   ALBUMIN 3.2 (L) 10/08/2023   LABGLOB 2.8 02/19/2019   AGRATIO 1.4 02/19/2019   BILITOT 0.8 10/08/2023   ALKPHOS 43 10/08/2023   AST 24 10/08/2023   ALT 14 10/08/2023   ANIONGAP 13 10/08/2023    GFR: CrCl cannot be calculated (Unknown ideal weight.).  Recent Results (from the past 240 hours)  Resp panel by RT-PCR (RSV, Flu A&B, Covid) Anterior Nasal Swab     Status: None   Collection Time: 10/08/23  8:52 AM   Specimen: Anterior Nasal Swab  Result Value Ref Range Status   SARS Coronavirus 2 by RT PCR NEGATIVE NEGATIVE Final   Influenza A by PCR NEGATIVE NEGATIVE Final   Influenza B by PCR NEGATIVE NEGATIVE Final    Comment: (NOTE) The Xpert Xpress SARS-CoV-2/FLU/RSV plus assay is intended as an aid in the diagnosis of influenza from Nasopharyngeal swab specimens and should not be used as a sole basis for treatment. Nasal washings and aspirates are unacceptable for Xpert Xpress SARS-CoV-2/FLU/RSV testing.  Fact Sheet for Patients: BloggerCourse.com  Fact Sheet for Healthcare Providers: SeriousBroker.it  This test is not yet approved or cleared by the [place] FDA and has been authorized for detection and/or diagnosis of SARS-CoV-2 by FDA under an Emergency Use Authorization (EUA). This EUA will remain in effect (meaning this test can be used) for the duration of the COVID-19 declaration under Section 564(b)(1) of the Act, 21 U.S.C. section 360bbb-3(b)(1), unless the authorization is terminated or revoked.     Resp Syncytial Virus by PCR NEGATIVE NEGATIVE Final    Comment: (NOTE) Fact Sheet for Patients: BloggerCourse.com  Fact Sheet for Healthcare Providers: SeriousBroker.it  This test is not yet approved or cleared by the [place] FDA and has been  authorized for detection and/or diagnosis of SARS-CoV-2 by FDA under an Emergency Use Authorization (EUA). This EUA will remain in effect (meaning this test can be used) for the duration of the COVID-19 declaration under Section 564(b)(1) of the Act, 21 U.S.C. section 360bbb-3(b)(1), unless the authorization is terminated or revoked.  Performed at [facility], [address], [place], [place] [address]   Culture, blood (routine x 2)     Status: None (Preliminary result)   Collection Time: 10/08/23  9:09 AM   Specimen: BLOOD LEFT HAND  Result Value Ref Range Status   Specimen Description BLOOD LEFT HAND  Final   Special Requests   Final    BOTTLES DRAWN AEROBIC ONLY Performed at [facility], [address] [place], [place] [address]    Culture PENDING  Incomplete   Report Status PENDING  Incomplete  Respiratory (~20 pathogens) panel by PCR     Status: Abnormal   Collection Time: 10/08/23  8:04 PM   Specimen: Nasopharyngeal Swab; Respiratory  Result Value Ref Range Status   Adenovirus NOT DETECTED NOT DETECTED Final   Coronavirus 229E NOT DETECTED NOT DETECTED Final    Comment: (NOTE) The Coronavirus on the Respiratory Panel, DOES NOT test for the novel  Coronavirus (2019 nCoV)    Coronavirus HKU1 NOT DETECTED NOT DETECTED Final   Coronavirus NL63 NOT DETECTED NOT DETECTED Final   Coronavirus OC43 NOT DETECTED NOT DETECTED Final   Metapneumovirus NOT DETECTED NOT DETECTED Final   Rhinovirus / Enterovirus NOT DETECTED NOT DETECTED Final   Influenza A NOT DETECTED NOT  DETECTED Final   Influenza B NOT DETECTED NOT DETECTED Final   Parainfluenza Virus 1 NOT DETECTED NOT DETECTED Final   Parainfluenza Virus 2 NOT DETECTED NOT DETECTED Final   Parainfluenza Virus 3 NOT DETECTED NOT DETECTED Final   Parainfluenza Virus 4 NOT DETECTED NOT DETECTED Final   Respiratory Syncytial Virus DETECTED (A) NOT DETECTED Final   Bordetella pertussis NOT DETECTED NOT DETECTED Final    Bordetella Parapertussis NOT DETECTED NOT DETECTED Final   Chlamydophila pneumoniae NOT DETECTED NOT DETECTED Final   Mycoplasma pneumoniae NOT DETECTED NOT DETECTED Final    Comment: Performed at [facility], [address] [place], [place] [address]  MRSA Next Gen by PCR, Nasal     Status: None   Collection Time: 10/08/23  8:04 PM   Specimen: Nasal Mucosa; Nasal Swab  Result Value Ref Range Status   MRSA by PCR Next Gen NOT DETECTED NOT DETECTED Final    Comment: (NOTE) The GeneXpert MRSA Assay (FDA approved for NASAL specimens only), is one component of a comprehensive MRSA colonization surveillance program. It is not intended to diagnose MRSA infection nor to guide or monitor treatment for MRSA infections. Test performance is not FDA approved in patients less than 59 years old. Performed at [facility], [address] [place], [place] [address]   Expectorated Sputum Assessment w Gram Stain, Rflx to Resp Cult     Status: None   Collection Time: 10/09/23  6:00 AM   Specimen: Expectorated Sputum  Result Value Ref Range Status   Specimen Description EXPECTORATED SPUTUM  Final   Special Requests NONE  Final   Sputum evaluation   Final    THIS SPECIMEN IS ACCEPTABLE FOR SPUTUM CULTURE Performed at [facility], [address] [place], [place] [address]    Report Status 10/09/2023 FINAL  Final  Culture, Respiratory w Gram Stain     Status: None (Preliminary result)   Collection Time: 10/09/23  6:00 AM  Result Value Ref Range Status   Specimen Description EXPECTORATED SPUTUM  Final   Special Requests NONE Reflexed from [id number]  Final   Gram Stain   Final    RARE SQUAMOUS EPITHELIAL CELLS PRESENT FEW WBC PRESENT,BOTH PMN AND MONONUCLEAR RARE GRAM VARIABLE ROD Performed at [facility], [address] [place], [place] [address]    Culture PENDING  Incomplete   Report Status PENDING  Incomplete      Radiology Studies: CT Angio Chest PE W and/or  Wo Contrast Result Date: 10/08/2023 CLINICAL DATA:  Chest pain and shortness of breath. Cough. High probability for pulmonary embolism. Lung carcinoma. * Tracking Code: BO * EXAM: CT ANGIOGRAPHY CHEST WITH CONTRAST TECHNIQUE: Multidetector CT imaging of the chest was performed using the standard protocol during bolus administration of intravenous contrast. Multiplanar CT image reconstructions and MIPs were obtained to evaluate the vascular anatomy. RADIATION DOSE REDUCTION: This exam was performed according to the departmental dose-optimization program which includes automated exposure control, adjustment of the mA and/or kV according to patient size and/or use of iterative reconstruction technique. CONTRAST:  75mL OMNIPAQUE IOHEXOL 350 MG/ML SOLN COMPARISON:  10/02/2023 FINDINGS: Cardiovascular: Satisfactory opacification of pulmonary arteries noted, and no pulmonary emboli identified. No evidence of thoracic aortic dissection or aneurysm. Mediastinum/Nodes: No masses or pathologically enlarged lymph nodes identified. Lungs/Pleura: Ill-defined masslike opacity is again seen in the anterior right upper lobe with adjacent mild bronchiectasis. This measures 3.0 x 2.8 cm on image 42/6, without significant  change since previous study. A few sub-centimeter ill-defined nodular opacities in the left upper and lower lobes also unchanged. Increased dependent atelectasis is seen in both lung bases, with mucous plugging noted in several peripheral left lower lobe bronchi. Upper abdomen: No acute findings. Musculoskeletal: No suspicious bone lesions identified. Review of the MIP images confirms the above findings. IMPRESSION: No evidence of pulmonary embolism. Increased dependent atelectasis in both lung bases, with mucous plugging noted in several peripheral left lower lobe bronchi. Stable masslike opacity in anterior right upper lobe. Stable sub-centimeter nodular opacities in left upper and lower lobes. Electronically  Signed   By: Danae Orleans M.D.   On: 10/08/2023 16:41   DG Chest Port 1 View Result Date: 10/08/2023 CLINICAL DATA:  Cough and shortness of breath. Right upper lobe non-small-cell lung cancer. Breast cancer. EXAM: PORTABLE CHEST 1 VIEW COMPARISON:  03/28/2023.  Chest CT dated 10/02/2023 FINDINGS: Right upper lobe mass, posttreatment changes without significant change since 10/02/2023 the lungs remain hyperexpanded. Minimal patchy densities in the mid left lung without significant change since 10/02/2023. No pleural fluid stable mildly enlarged cardiac silhouette and tortuous and partially calcified thoracic aorta. Stable mild-to-moderate thoracolumbar scoliosis and diffuse osteopenia. IMPRESSION: 1. Stable minimal patchy density in the left mid lung zone, possibly representing minimal pneumonia or focal inflammatory changes. 2. Stable hyperexpansion of the lungs compatible with COPD. 3. Stable right upper lobe mass, posttreatment changes. 4. Stable mild cardiomegaly. Electronically Signed   By: Beckie Salts M.D.   On: 10/08/2023 09:05      LOS: 0 days    Jacquelin Hawking, MD [facility] 10/09/2023, 8:01 AM   If 7PM-7AM, please contact night-coverage [email or web address]

## 2023-10-10 DIAGNOSIS — J9621 Acute and chronic respiratory failure with hypoxia: Secondary | ICD-10-CM | POA: Diagnosis present

## 2023-10-10 DIAGNOSIS — E785 Hyperlipidemia, unspecified: Secondary | ICD-10-CM | POA: Diagnosis present

## 2023-10-10 DIAGNOSIS — J189 Pneumonia, unspecified organism: Secondary | ICD-10-CM | POA: Diagnosis present

## 2023-10-10 DIAGNOSIS — I2510 Atherosclerotic heart disease of native coronary artery without angina pectoris: Secondary | ICD-10-CM | POA: Diagnosis present

## 2023-10-10 DIAGNOSIS — C3411 Malignant neoplasm of upper lobe, right bronchus or lung: Secondary | ICD-10-CM | POA: Diagnosis present

## 2023-10-10 DIAGNOSIS — I5032 Chronic diastolic (congestive) heart failure: Secondary | ICD-10-CM | POA: Diagnosis present

## 2023-10-10 DIAGNOSIS — J440 Chronic obstructive pulmonary disease with acute lower respiratory infection: Secondary | ICD-10-CM | POA: Diagnosis present

## 2023-10-10 DIAGNOSIS — Z9981 Dependence on supplemental oxygen: Secondary | ICD-10-CM | POA: Diagnosis not present

## 2023-10-10 DIAGNOSIS — Z1152 Encounter for screening for COVID-19: Secondary | ICD-10-CM | POA: Diagnosis not present

## 2023-10-10 DIAGNOSIS — G8929 Other chronic pain: Secondary | ICD-10-CM | POA: Diagnosis present

## 2023-10-10 DIAGNOSIS — J121 Respiratory syncytial virus pneumonia: Secondary | ICD-10-CM | POA: Diagnosis present

## 2023-10-10 DIAGNOSIS — R0600 Dyspnea, unspecified: Secondary | ICD-10-CM | POA: Diagnosis not present

## 2023-10-10 DIAGNOSIS — G9341 Metabolic encephalopathy: Secondary | ICD-10-CM | POA: Diagnosis present

## 2023-10-10 DIAGNOSIS — R443 Hallucinations, unspecified: Secondary | ICD-10-CM | POA: Diagnosis present

## 2023-10-10 DIAGNOSIS — E876 Hypokalemia: Secondary | ICD-10-CM | POA: Diagnosis present

## 2023-10-10 DIAGNOSIS — Z7951 Long term (current) use of inhaled steroids: Secondary | ICD-10-CM | POA: Diagnosis not present

## 2023-10-10 DIAGNOSIS — H548 Legal blindness, as defined in USA: Secondary | ICD-10-CM | POA: Diagnosis present

## 2023-10-10 DIAGNOSIS — Z933 Colostomy status: Secondary | ICD-10-CM | POA: Diagnosis not present

## 2023-10-10 DIAGNOSIS — Z79890 Hormone replacement therapy (postmenopausal): Secondary | ICD-10-CM | POA: Diagnosis not present

## 2023-10-10 DIAGNOSIS — I110 Hypertensive heart disease with heart failure: Secondary | ICD-10-CM | POA: Diagnosis present

## 2023-10-10 DIAGNOSIS — K219 Gastro-esophageal reflux disease without esophagitis: Secondary | ICD-10-CM | POA: Diagnosis present

## 2023-10-10 DIAGNOSIS — E039 Hypothyroidism, unspecified: Secondary | ICD-10-CM | POA: Diagnosis present

## 2023-10-10 DIAGNOSIS — Z66 Do not resuscitate: Secondary | ICD-10-CM | POA: Diagnosis present

## 2023-10-10 DIAGNOSIS — E871 Hypo-osmolality and hyponatremia: Secondary | ICD-10-CM | POA: Diagnosis present

## 2023-10-10 LAB — BASIC METABOLIC PANEL
Anion gap: 9 (ref 5–15)
BUN: 20 mg/dL (ref 8–23)
CO2: 23 mmol/L (ref 22–32)
Calcium: 8 mg/dL — ABNORMAL LOW (ref 8.9–10.3)
Chloride: 98 mmol/L (ref 98–111)
Creatinine, Ser: 1 mg/dL (ref 0.44–1.00)
GFR, Estimated: 54 mL/min — ABNORMAL LOW (ref >60)
Glucose, Bld: 98 mg/dL (ref 70–99)
Potassium: 3.3 mmol/L — ABNORMAL LOW (ref 3.5–5.1)
Sodium: 130 mmol/L — ABNORMAL LOW (ref 135–145)

## 2023-10-10 LAB — PHOSPHORUS: Phosphorus: 2.2 mg/dL — ABNORMAL LOW (ref 2.5–4.6)

## 2023-10-10 LAB — STREP PNEUMONIAE URINARY ANTIGEN: Strep Pneumo Urinary Antigen: NEGATIVE

## 2023-10-10 MED ORDER — POTASSIUM CHLORIDE CRYS ER 20 MEQ PO TBCR
40.0000 meq | EXTENDED_RELEASE_TABLET | Freq: Once | ORAL | Status: AC
Start: 2023-10-10 — End: 2023-10-10
  Administered 2023-10-10: 40 meq via ORAL
  Filled 2023-10-10: qty 2

## 2023-10-10 MED ORDER — SODIUM PHOSPHATES 45 MMOLE/15ML IV SOLN
15.0000 mmol | Freq: Once | INTRAVENOUS | Status: AC
  Administered 2023-10-10: 15 mmol via INTRAVENOUS
  Filled 2023-10-10: qty 5

## 2023-10-10 NOTE — Evaluation (Signed)
Occupational Therapy Evaluation Patient Details Name: Marissa Caldwell MRN: [id number] DOB: [birth year] Today's Date: 10/10/2023   History of Present Illness   Pt is a 89 y/o female who presents 10/08/2023 with SOB. She was found to have RSV and CAP. PMH significant for lung CA s/p radiation, COPD, CAD s/p PCI, legally blind, colon CA s/p R colectomy, breast CA s/p radiation, HTN, hypothyroidism, chronic respiratory failure on 3L/min supplemental O2 at night.     Clinical Impressions Pt is typically mod I in apt, does not drive. Legally blind and very HOH. Has good support from neighbors and son/granddaughter. Typically enjoys walking multiple times a day and caring for her dog "Charlie". Today Pt is limited by feelings of nausea after ambulation, SpO2 (typically only on O2 at night while sleeping) reading 90% on RA with in room ambulation. BUE generalized weakness but functional for tasks. Able to demonstrate toilet transfer, peri care, clothing manipulation at GCA. Overall Pt presents with decreased activity tolerance and min A for ADL. Pt will benefit from skilled OT in the acute setting as well as afterwards at the [facility] level to maximize safety and independence in ADL and and functional transfers. Next session continue to assess SpO2 as well as practice tasks associated with caregiving for dog and provide energy conservation strategies (provide handout - but be prepared to explain due to baseline visual deficits)     If plan is discharge home, recommend the following:   A little help with walking and/or transfers;A little help with bathing/dressing/bathroom;Assistance with cooking/housework;Assist for transportation     Functional Status Assessment   Patient has had a recent decline in their functional status and demonstrates the ability to make significant improvements in function in a reasonable and predictable amount of time.     Equipment Recommendations   Tub/shower seat      Recommendations for Other Services   PT consult     Precautions/Restrictions   Precautions Precautions: Fall Recall of Precautions/Restrictions: Intact Precaution/Restrictions Comments: HOH, legally blind Restrictions Weight Bearing Restrictions Per Provider Order: No     Mobility Bed Mobility Overal bed mobility: Modified Independent             General bed mobility comments: supine>sitting, no assist or use of rail including scooting to EOB    Transfers Overall transfer level: Needs assistance Equipment used: Rolling walker (2 wheels) Transfers: Sit to/from Stand Sit to Stand: Min assist           General transfer comment: slight boost and assist for balance with initial sit<>stand x2      Balance Overall balance assessment: Needs assistance Sitting-balance support: Feet supported, No upper extremity supported Sitting balance-Leahy Scale: Fair     Standing balance support: No upper extremity supported, During functional activity Standing balance-Leahy Scale: Fair Standing balance comment: statically - fair; dynamically - poor                           ADL either performed or assessed with clinical judgement   ADL Overall ADL's : Needs assistance/impaired Eating/Feeding: Supervision/ safety   Grooming: Contact guard assist;Standing Grooming Details (indicate cue type and reason): limited standing activity tolerance Upper Body Bathing: Minimal assistance Upper Body Bathing Details (indicate cue type and reason): for back Lower Body Bathing: Supervison/ safety;Sitting/lateral leans Lower Body Bathing Details (indicate cue type and reason): from seated position Upper Body Dressing : Minimal assistance Upper Body Dressing Details (indicate cue type and reason):  to don gown like robe Lower Body Dressing: Supervision/safety;Sitting/lateral leans Lower Body Dressing Details (indicate cue type and reason): socks and managing underwear Toilet  Transfer: Contact guard assist;Ambulation;Rolling walker (2 wheels) Toilet Transfer Details (indicate cue type and reason): cues for safety with RW Toileting- Clothing Manipulation and Hygiene: Contact guard assist;Sit to/from stand Toileting - Clothing Manipulation Details (indicate cue type and reason): underwear and peri care     Functional mobility during ADLs: Contact guard assist;Rolling walker (2 wheels);Cueing for safety General ADL Comments: generalized weakness, decreased activity tolerance     Vision Baseline Vision/History: 1 Wears glasses Ability to See in Adequate Light: 2 Moderately impaired Patient Visual Report: No change from baseline;Other (comment) (legally blind at baseline) Additional Comments: legally blind     Perception         Praxis         Pertinent Vitals/Pain       Extremity/Trunk Assessment Upper Extremity Assessment Upper Extremity Assessment: Generalized weakness   Lower Extremity Assessment Lower Extremity Assessment: Defer to PT evaluation   Cervical / Trunk Assessment Cervical / Trunk Assessment: Kyphotic   Communication Communication Communication: Impaired Factors Affecting Communication: Hearing impaired   Cognition Arousal: Alert Behavior During Therapy: WFL for tasks assessed/performed                                 Following commands: Intact       Cueing  General Comments   Cueing Techniques: Verbal cues;Gestural cues;Tactile cues      Exercises     Shoulder Instructions      Home Living Family/patient expects to be discharged to:: Private residence Living Arrangements: Alone (has a Geologist, engineering") Available Help at Discharge: Family;Neighbor;Available PRN/intermittently Type of Home: Apartment Home Access: Stairs to enter Entrance Stairs-Number of Steps: 1 curb step   Home Layout: One level     Bathroom Shower/Tub: Chief Strategy Officer: Handicapped height Bathroom  Accessibility: No   Home Equipment: BSC/3in1;Grab bars - toilet;Grab bars - tub/shower;Hand held shower head   Additional Comments: Pt lives in an [place] 55 community, eats lots of frozen meals      Prior Functioning/Environment Prior Level of Function : Needs assist             Mobility Comments: furniture surfs ADLs Comments: only cooks microwaveable meals    OT Problem List: Decreased activity tolerance;Impaired balance (sitting and/or standing);Decreased knowledge of use of DME or AE;Cardiopulmonary status limiting activity   OT Treatment/Interventions: Self-care/ADL training;Energy conservation;DME and/or AE instruction;Therapeutic activities;Visual/perceptual remediation/compensation;Patient/family education;Balance training      OT Goals(Current goals can be found in the care plan section)   Acute Rehab OT Goals Patient Stated Goal: get home to [facility] OT Goal Formulation: With patient Time For Goal Achievement: 10/24/23 Potential to Achieve Goals: Good ADL Goals Pt Will Perform Grooming: with modified independence;standing Pt Will Perform Upper Body Dressing: with modified independence;sitting Pt Will Perform Lower Body Dressing: with modified independence;sit to/from stand Pt Will Transfer to Toilet: ambulating;with modified independence Pt Will Perform Toileting - Clothing Manipulation and hygiene: with modified independence;sit to/from stand;sitting/lateral leans Additional ADL Goal #1: Pt will verbalize at least 3 energy conservation strategies for ADL with no cues Additional ADL Goal #2: Pt will demonstrate ability to perform care tasks for her dog (i.e. pick up bowl off the floor) at supervision level   OT Frequency:  Min 1X/week    Co-evaluation  AM-PAC OT "6 Clicks" Daily Activity     Outcome Measure Help from another person eating meals?: A Little Help from another person taking care of personal grooming?: A Little Help from another  person toileting, which includes using toliet, bedpan, or urinal?: A Little Help from another person bathing (including washing, rinsing, drying)?: A Little Help from another person to put on and taking off regular upper body clothing?: A Little Help from another person to put on and taking off regular lower body clothing?: A Little 6 Click Score: 18   End of Session Equipment Utilized During Treatment: Gait belt;Rolling walker (2 wheels) Nurse Communication: Mobility status;Other (comment);Precautions (SpO2)  Activity Tolerance: Patient tolerated treatment well Patient left: in bed;Other (comment) (sitting EOB with PT)  OT Visit Diagnosis: Unsteadiness on feet (R26.81);Muscle weakness (generalized) (M62.81)                Time: 1610-9604 OT Time Calculation (min): 24 min Charges:  OT General Charges $OT Visit: 1 Visit OT Evaluation $OT Eval Low Complexity: 1 Low Nyoka Cowden OTR/L Acute Rehabilitation Services Office: [phone]   Evern Bio [facility] 10/10/2023, 11:34 AM

## 2023-10-10 NOTE — TOC Progression Note (Addendum)
Transition of Care (TOC) - Progression Note   PT recommending home health PT/ OT Discussed with patient. Offered choice. She has had [facility] in the past and would like them again. Eber Jones with [facility] accepted referral.   Will need orders and face to face.   Patient has nouturnal oxygen through [employer]  at home. She does not have portable oxygen DME at home. If needed will need a complete ambulatory oxygen saturation note and order. Messaged team. Discussed with patient    PT also recommending tub seat and rolling walker. Patient states she already has a walker and does not want tub seat. HHPT will follow.   1315 Called Jermiane with [employer] for new oxygen order and patient will need portable tank  Patient Details  Name: Marissa Caldwell MRN: [id number] Date of Birth: [birth year]  Transition of Care [facility]) CM/SW Contact  Nadene Rubins Adria Devon, RN Phone Number: 10/10/2023, 11:45 AM  Clinical Narrative:            Expected Discharge Plan and Services                                               Social Determinants of Health (SDOH) Interventions SDOH Screenings   Food Insecurity: No Food Insecurity (10/08/2023)  Housing: Patient Declined (10/08/2023)  Transportation Needs: No Transportation Needs (10/08/2023)  Utilities: Patient Declined (10/08/2023)  Social Connections: Unknown (10/08/2023)  Tobacco Use: Medium Risk (10/08/2023)    Readmission Risk Interventions     No data to display

## 2023-10-10 NOTE — Plan of Care (Signed)
  Problem: Pain Managment: Goal: General experience of comfort will improve and/or be controlled Outcome: Progressing   Problem: Safety: Goal: Ability to remain free from injury will improve Outcome: Progressing   Problem: Skin Integrity: Goal: Risk for impaired skin integrity will decrease Outcome: Progressing

## 2023-10-10 NOTE — Evaluation (Signed)
Physical Therapy Evaluation  Patient Details Name: Marissa Caldwell MRN: [id number] DOB: [birth year] Today's Date: 10/10/2023  History of Present Illness  Pt is a 89 y/o female who presents 10/08/2023 with SOB. She was found to have RSV and CAP. PMH significant for lung CA s/p radiation, COPD, CAD s/p PCI, legally blind, colon CA s/p R colectomy, breast CA s/p radiation, HTN, hypothyroidism, chronic respiratory failure on 3L/min supplemental O2 at night.   Clinical Impression  Pt admitted with above diagnosis. Pt currently with functional limitations due to the deficits listed below (see PT Problem List). At the time of PT eval pt was able to perform transfers with modified independence to supervision for safety, and ambulation with gross CGA and RW for support. O2 sats down to 85% on RA after ambulation, improving to 93% on 3L/min supplemental O2 within 1 minute. Pt will benefit from acute skilled PT to increase their independence and safety with mobility to allow discharge.           If plan is discharge home, recommend the following: A little help with walking and/or transfers;A little help with bathing/dressing/bathroom;Assistance with cooking/housework;Assist for transportation;Help with stairs or ramp for entrance   Can travel by private vehicle        Equipment Recommendations Rolling walker (2 wheels)  Recommendations for Other Services       Functional Status Assessment Patient has had a recent decline in their functional status and demonstrates the ability to make significant improvements in function in a reasonable and predictable amount of time.     Precautions / Restrictions Precautions Precautions: Fall Recall of Precautions/Restrictions: Intact Precaution/Restrictions Comments: HOH, legally blind Restrictions Weight Bearing Restrictions Per Provider Order: No      Mobility  Bed Mobility Overal bed mobility: Modified Independent             General bed mobility  comments: Sit>supine, pt was able to complete with HOB flat and no use of rails. No assist required and pt was able to reposition once supine.    Transfers Overall transfer level: Needs assistance Equipment used: Rolling walker (2 wheels) Transfers: Sit to/from Stand Sit to Stand: Supervision           General transfer comment: Light supervision for safety as pt powered up to full stand. Pt demonstrated good hand placement on seated surface for safety.    Ambulation/Gait Ambulation/Gait assistance: Contact guard assist Gait Distance (Feet): 75 Feet Assistive device: Rolling walker (2 wheels) Gait Pattern/deviations: Step-through pattern, Decreased stride length, Trunk flexed Gait velocity: Decreased Gait velocity interpretation: <1.8 ft/sec, indicate of risk for recurrent falls   General Gait Details: Pt ambulating well on RA but fatigues quickly with DOE present about 35-40' in to walk. Pt cued for pursed lip breathing and to slow her breathing. O2 sats 87% at this time down to 85% with seated rest break on RA.  Stairs            Wheelchair Mobility     Tilt Bed    Modified Rankin (Stroke Patients Only)       Balance Overall balance assessment: Needs assistance Sitting-balance support: Feet supported, No upper extremity supported Sitting balance-Leahy Scale: Fair     Standing balance support: No upper extremity supported, During functional activity Standing balance-Leahy Scale: Fair Standing balance comment: statically - fair; dynamically - poor  Pertinent Vitals/Pain Pain Assessment Pain Assessment: No/denies pain    Home Living Family/patient expects to be discharged to:: Private residence Living Arrangements: Alone Available Help at Discharge: Family;Neighbor;Available PRN/intermittently Type of Home: Apartment Home Access: Stairs to enter   Entrance Stairs-Number of Steps: 1 curb step   Home Layout: One  level Home Equipment: BSC/3in1 Additional Comments: Some information taken from prior admission.    Prior Function Prior Level of Function : Needs assist             Mobility Comments: Pt reports her apartment is too small for a walker. Unclear if she has access to one or not, however. Pt states she touches to walls and furniture to walk around in her apartment.       Extremity/Trunk Assessment   Upper Extremity Assessment Upper Extremity Assessment: Defer to OT evaluation    Lower Extremity Assessment Lower Extremity Assessment: Generalized weakness (Mild, however age appropriate.)    Cervical / Trunk Assessment Cervical / Trunk Assessment: Kyphotic  Communication   Communication Communication: Impaired Factors Affecting Communication: Hearing impaired    Cognition Arousal: Alert Behavior During Therapy: WFL for tasks assessed/performed   PT - Cognitive impairments: No apparent impairments                       PT - Cognition Comments: Pt showing good insight to deficits and able to self monitor for distance during ambulation. Following commands: Intact       Cueing Cueing Techniques: Verbal cues, Gestural cues, Tactile cues     General Comments      Exercises     Assessment/Plan    PT Assessment Patient needs continued PT services  PT Problem List Decreased strength;Decreased activity tolerance;Decreased balance;Decreased mobility;Decreased knowledge of use of DME;Decreased safety awareness;Decreased knowledge of precautions;Cardiopulmonary status limiting activity       PT Treatment Interventions Gait training;DME instruction;Functional mobility training;Therapeutic activities;Therapeutic exercise;Balance training;Patient/family education    PT Goals (Current goals can be found in the Care Plan section)  Acute Rehab PT Goals Patient Stated Goal: Return to her home PT Goal Formulation: With patient Time For Goal Achievement:  10/17/23 Potential to Achieve Goals: Good Additional Goals Additional Goal #1: Pt will be able to maintain SpO2 at 88% or greater on RA with ambulation of 50' or more.    Frequency Min 1X/week     Co-evaluation               AM-PAC PT "6 Clicks" Mobility  Outcome Measure Help needed turning from your back to your side while in a flat bed without using bedrails?: None Help needed moving from lying on your back to sitting on the side of a flat bed without using bedrails?: None Help needed moving to and from a bed to a chair (including a wheelchair)?: A Little Help needed standing up from a chair using your arms (e.g., wheelchair or bedside chair)?: A Little Help needed to walk in hospital room?: A Little Help needed climbing 3-5 steps with a railing? : A Little 6 Click Score: 20    End of Session Equipment Utilized During Treatment: Gait belt;Oxygen Activity Tolerance: Patient tolerated treatment well Patient left: in bed;with call bell/phone within reach;with bed alarm set Nurse Communication: Mobility status PT Visit Diagnosis: Unsteadiness on feet (R26.81);Difficulty in walking, not elsewhere classified (R26.2)    Time: 1610-9604 PT Time Calculation (min) (ACUTE ONLY): 29 min   Charges:   PT Evaluation $PT Eval Moderate Complexity:  1 Mod PT Treatments $Gait Training: 8-22 mins PT General Charges $$ ACUTE PT VISIT: 1 Visit         Conni Slipper, PT, DPT Acute Rehabilitation Services Secure Chat Preferred Office: [phone]   Marylynn Pearson 10/10/2023, 10:35 AM

## 2023-10-10 NOTE — Progress Notes (Addendum)
PROGRESS NOTE    Marissa Caldwell  ZOX:[id number] DOB: 1932/07/31 DOA: 10/08/2023 PCP: Laurann Montana, MD   Brief Narrative: 89 year old with past medical history significant for CAD status post PCI, legally blind, GERD, colon cancer status post right colectomy, breast cancer status postlumpectomy and radiotherapy, lung cancer s/p radiation, hypertension, hyperlipidemia, hypothyroidism, COPD, anxiety, chronic respiratory failure on home oxygen at night only presented with worsening shortness of breath, cough, found to have pneumonia and RSV positive.  Started on empiric antibiotics.    Assessment & Plan:   Principal Problem:   Community acquired pneumonia Active Problems:   Dyspnea   1-Community-acquired pneumonia, RSV positive infection  Patient presented with cough, chest x-ray atelectasis versus mucous plugging in the setting of lung cancer.  RSV positive. -Continue ceftriaxone and doxycycline -Follow blood cultures, sputum culture -Continue Mucinex  RSV infection: Continue with supportive care  Acute on chronic hypoxic respiratory failure, she uses 3 L of oxygen at bedtime Requiring oxygen at rest and on ambulation  COPD without exacerbation: Continue with Dulera and DuoNeb  Hypokalemia Replete   Hypothyroidism -Continue Synthroid   Hyperlipidemia -Continue pravastatin   History of breast cancer S/p right breast lumpectomy, adjuvant radiotherapy and is currently on Tamoxifen -Continue tamoxifen   History of lung cancer Right upper lobe mass. Patient follows with radiation oncology for radiation therapy.   History of right colon cancer History of right colectomy.   Chronic pain -Continue Tramadol and gabapentin   Anxiety -Continue Ativan as needed   Hypophosphatemia -phosphorus supplementation   GERD -Continue famotidine   Hyponatremia; monitor     Estimated body mass index is 19.05 kg/m as calculated from the following:   Height as of 04/17/23:  5\' 10"  (1.778 m).   Weight as of 04/17/23: 60.2 kg.   DVT prophylaxis: Lovenox Code Status: DNR Family Communication: Son updated Disposition Plan:  Status is: Observation The patient remains OBS appropriate and will d/c before 2 midnights.    Consultants:  none  Procedures:  none  Antimicrobials:    Subjective: She is alert, denies pain. Report cough, not feeling well   Objective: Vitals:   10/09/23 0839 10/09/23 1628 10/09/23 2034 10/10/23 0446  BP: (!) 97/56 (!) 144/63 123/67 112/61  Pulse: 85 82 79 72  Resp: 18 20 18 18   Temp: 98.9 F (37.2 C)  99.4 F (37.4 C) 99.3 F (37.4 C)  TempSrc: Oral  Oral Oral  SpO2: 92% 98% 96% 97%    Intake/Output Summary (Last 24 hours) at 10/10/2023 8119 Last data filed at 10/10/2023 1478 Gross per 24 hour  Intake 460 ml  Output 400 ml  Net 60 ml   There were no vitals filed for this visit.  Examination:  General exam: Appears calm and comfortable  Respiratory system: BL Rochus.  Cardiovascular system: S1 & S2 heard, RRR.  Gastrointestinal system: Abdomen is nondistended, soft and nontender. No organomegaly or masses felt. Normal bowel sounds heard. Central nervous system: Alert and oriented.  Extremities: Symmetric 5 x 5 power.   Data Reviewed: I have personally reviewed following labs and imaging studies  CBC: Recent Labs  Lab 10/08/23 0852 10/09/23 0658  WBC 13.3* 10.0  NEUTROABS 11.6*  --   HGB 12.6 11.5*  HCT 37.1 33.7*  MCV 94.6 92.8  PLT 231 183   Basic Metabolic Panel: Recent Labs  Lab 10/08/23 1016 10/09/23 0658 10/10/23 0555  NA 134* 133* 130*  K 3.3* 3.8 3.3*  CL 100 100 98  CO2 21*  21* 23  GLUCOSE 123* 109* 98  BUN 20 22 20   CREATININE 0.53 0.68 1.00  CALCIUM 8.4* 8.2* 8.0*  MG  --  2.1  --   PHOS  --  2.1* 2.2*   GFR: CrCl cannot be calculated (Unknown ideal weight.). Liver Function Tests: Recent Labs  Lab 10/08/23 1016  AST 24  ALT 14  ALKPHOS 43  BILITOT 0.8  PROT 6.6   ALBUMIN 3.2*   No results for input(s): "LIPASE", "AMYLASE" in the last 168 hours. No results for input(s): "AMMONIA" in the last 168 hours. Coagulation Profile: No results for input(s): "INR", "PROTIME" in the last 168 hours. Cardiac Enzymes: No results for input(s): "CKTOTAL", "CKMB", "CKMBINDEX", "TROPONINI" in the last 168 hours. BNP (last 3 results) No results for input(s): "PROBNP" in the last 8760 hours. HbA1C: No results for input(s): "HGBA1C" in the last 72 hours. CBG: No results for input(s): "GLUCAP" in the last 168 hours. Lipid Profile: No results for input(s): "CHOL", "HDL", "LDLCALC", "TRIG", "CHOLHDL", "LDLDIRECT" in the last 72 hours. Thyroid Function Tests: No results for input(s): "TSH", "T4TOTAL", "FREET4", "T3FREE", "THYROIDAB" in the last 72 hours. Anemia Panel: No results for input(s): "VITAMINB12", "FOLATE", "FERRITIN", "TIBC", "IRON", "RETICCTPCT" in the last 72 hours. Sepsis Labs: Recent Labs  Lab 10/08/23 0859 10/08/23 1024 10/09/23 0658  PROCALCITON  --   --  0.16  LATICACIDVEN 1.6 1.0  --     Recent Results (from the past 240 hours)  Resp panel by RT-PCR (RSV, Flu A&B, Covid) Anterior Nasal Swab     Status: None   Collection Time: 10/08/23  8:52 AM   Specimen: Anterior Nasal Swab  Result Value Ref Range Status   SARS Coronavirus 2 by RT PCR NEGATIVE NEGATIVE Final   Influenza A by PCR NEGATIVE NEGATIVE Final   Influenza B by PCR NEGATIVE NEGATIVE Final    Comment: (NOTE) The Xpert Xpress SARS-CoV-2/FLU/RSV plus assay is intended as an aid in the diagnosis of influenza from Nasopharyngeal swab specimens and should not be used as a sole basis for treatment. Nasal washings and aspirates are unacceptable for Xpert Xpress SARS-CoV-2/FLU/RSV testing.  Fact Sheet for Patients: BloggerCourse.com  Fact Sheet for Healthcare Providers: SeriousBroker.it  This test is not yet approved or cleared by  the [place] FDA and has been authorized for detection and/or diagnosis of SARS-CoV-2 by FDA under an Emergency Use Authorization (EUA). This EUA will remain in effect (meaning this test can be used) for the duration of the COVID-19 declaration under Section 564(b)(1) of the Act, 21 U.S.C. section 360bbb-3(b)(1), unless the authorization is terminated or revoked.     Resp Syncytial Virus by PCR NEGATIVE NEGATIVE Final    Comment: (NOTE) Fact Sheet for Patients: BloggerCourse.com  Fact Sheet for Healthcare Providers: SeriousBroker.it  This test is not yet approved or cleared by the [place] FDA and has been authorized for detection and/or diagnosis of SARS-CoV-2 by FDA under an Emergency Use Authorization (EUA). This EUA will remain in effect (meaning this test can be used) for the duration of the COVID-19 declaration under Section 564(b)(1) of the Act, 21 U.S.C. section 360bbb-3(b)(1), unless the authorization is terminated or revoked.  Performed at [facility], [address], [place], [place] [address]   Culture, blood (routine x 2)     Status: None (Preliminary result)   Collection Time: 10/08/23  8:52 AM   Specimen: BLOOD LEFT ARM  Result Value Ref Range Status   Specimen Description BLOOD LEFT  ARM  Final   Special Requests   Final    BOTTLES DRAWN AEROBIC ONLY Blood Culture results may not be optimal due to an inadequate volume of blood received in culture bottles   Culture   Final    NO GROWTH 2 DAYS Performed at [facility], [address], [place], [place] [address]    Report Status PENDING  Incomplete  Culture, blood (routine x 2)     Status: None (Preliminary result)   Collection Time: 10/08/23  9:09 AM   Specimen: BLOOD LEFT HAND  Result Value Ref Range Status   Specimen Description BLOOD LEFT HAND  Final   Special Requests BOTTLES DRAWN AEROBIC ONLY  Final   Culture   Final    NO GROWTH  2 DAYS Performed at [facility], [address], [place], [place] [address]    Report Status PENDING  Incomplete  Respiratory (~20 pathogens) panel by PCR     Status: Abnormal   Collection Time: 10/08/23  8:04 PM   Specimen: Nasopharyngeal Swab; Respiratory  Result Value Ref Range Status   Adenovirus NOT DETECTED NOT DETECTED Final   Coronavirus 229E NOT DETECTED NOT DETECTED Final    Comment: (NOTE) The Coronavirus on the Respiratory Panel, DOES NOT test for the novel  Coronavirus (2019 nCoV)    Coronavirus HKU1 NOT DETECTED NOT DETECTED Final   Coronavirus NL63 NOT DETECTED NOT DETECTED Final   Coronavirus OC43 NOT DETECTED NOT DETECTED Final   Metapneumovirus NOT DETECTED NOT DETECTED Final   Rhinovirus / Enterovirus NOT DETECTED NOT DETECTED Final   Influenza A NOT DETECTED NOT DETECTED Final   Influenza B NOT DETECTED NOT DETECTED Final   Parainfluenza Virus 1 NOT DETECTED NOT DETECTED Final   Parainfluenza Virus 2 NOT DETECTED NOT DETECTED Final   Parainfluenza Virus 3 NOT DETECTED NOT DETECTED Final   Parainfluenza Virus 4 NOT DETECTED NOT DETECTED Final   Respiratory Syncytial Virus DETECTED (A) NOT DETECTED Final   Bordetella pertussis NOT DETECTED NOT DETECTED Final   Bordetella Parapertussis NOT DETECTED NOT DETECTED Final   Chlamydophila pneumoniae NOT DETECTED NOT DETECTED Final   Mycoplasma pneumoniae NOT DETECTED NOT DETECTED Final    Comment: Performed at [facility], [address] [place], [place] [address]  MRSA Next Gen by PCR, Nasal     Status: None   Collection Time: 10/08/23  8:04 PM   Specimen: Nasal Mucosa; Nasal Swab  Result Value Ref Range Status   MRSA by PCR Next Gen NOT DETECTED NOT DETECTED Final    Comment: (NOTE) The GeneXpert MRSA Assay (FDA approved for NASAL specimens only), is one component of a comprehensive MRSA colonization surveillance program. It is not intended to diagnose MRSA infection nor to guide or monitor  treatment for MRSA infections. Test performance is not FDA approved in patients less than 9 years old. Performed at [facility], [address], [place], [place] [address]   Expectorated Sputum Assessment w Gram Stain, Rflx to Resp Cult     Status: None   Collection Time: 10/09/23  6:00 AM   Specimen: Expectorated Sputum  Result Value Ref Range Status   Specimen Description EXPECTORATED SPUTUM  Final   Special Requests NONE  Final   Sputum evaluation   Final    THIS SPECIMEN IS ACCEPTABLE FOR SPUTUM CULTURE Performed at [facility], [address] [place], [place] [address]    Report Status 10/09/2023 FINAL  Final  Culture, Respiratory w Gram Stain     Status: None (Preliminary result)   Collection Time: 10/09/23  6:00 AM  Result Value Ref Range Status   Specimen Description EXPECTORATED SPUTUM  Final   Special Requests NONE Reflexed from [id number]  Final   Gram Stain   Final    RARE SQUAMOUS EPITHELIAL CELLS PRESENT FEW WBC PRESENT,BOTH PMN AND MONONUCLEAR RARE GRAM VARIABLE ROD Performed at [facility], [address], [place], [place] [address]    Culture PENDING  Incomplete   Report Status PENDING  Incomplete         Radiology Studies: CT Angio Chest PE W and/or Wo Contrast Result Date: 10/08/2023 CLINICAL DATA:  Chest pain and shortness of breath. Cough. High probability for pulmonary embolism. Lung carcinoma. * Tracking Code: BO * EXAM: CT ANGIOGRAPHY CHEST WITH CONTRAST TECHNIQUE: Multidetector CT imaging of the chest was performed using the standard protocol during bolus administration of intravenous contrast. Multiplanar CT image reconstructions and MIPs were obtained to evaluate the vascular anatomy. RADIATION DOSE REDUCTION: This exam was performed according to the departmental dose-optimization program which includes automated exposure control, adjustment of the mA and/or kV according to patient size and/or use of iterative reconstruction  technique. CONTRAST:  75mL OMNIPAQUE IOHEXOL 350 MG/ML SOLN COMPARISON:  10/02/2023 FINDINGS: Cardiovascular: Satisfactory opacification of pulmonary arteries noted, and no pulmonary emboli identified. No evidence of thoracic aortic dissection or aneurysm. Mediastinum/Nodes: No masses or pathologically enlarged lymph nodes identified. Lungs/Pleura: Ill-defined masslike opacity is again seen in the anterior right upper lobe with adjacent mild bronchiectasis. This measures 3.0 x 2.8 cm on image 42/6, without significant change since previous study. A few sub-centimeter ill-defined nodular opacities in the left upper and lower lobes also unchanged. Increased dependent atelectasis is seen in both lung bases, with mucous plugging noted in several peripheral left lower lobe bronchi. Upper abdomen: No acute findings. Musculoskeletal: No suspicious bone lesions identified. Review of the MIP images confirms the above findings. IMPRESSION: No evidence of pulmonary embolism. Increased dependent atelectasis in both lung bases, with mucous plugging noted in several peripheral left lower lobe bronchi. Stable masslike opacity in anterior right upper lobe. Stable sub-centimeter nodular opacities in left upper and lower lobes. Electronically Signed   By: Danae Orleans M.D.   On: 10/08/2023 16:41   DG Chest Port 1 View Result Date: 10/08/2023 CLINICAL DATA:  Cough and shortness of breath. Right upper lobe non-small-cell lung cancer. Breast cancer. EXAM: PORTABLE CHEST 1 VIEW COMPARISON:  03/28/2023.  Chest CT dated 10/02/2023 FINDINGS: Right upper lobe mass, posttreatment changes without significant change since 10/02/2023 the lungs remain hyperexpanded. Minimal patchy densities in the mid left lung without significant change since 10/02/2023. No pleural fluid stable mildly enlarged cardiac silhouette and tortuous and partially calcified thoracic aorta. Stable mild-to-moderate thoracolumbar scoliosis and diffuse osteopenia.  IMPRESSION: 1. Stable minimal patchy density in the left mid lung zone, possibly representing minimal pneumonia or focal inflammatory changes. 2. Stable hyperexpansion of the lungs compatible with COPD. 3. Stable right upper lobe mass, posttreatment changes. 4. Stable mild cardiomegaly. Electronically Signed   By: Beckie Salts M.D.   On: 10/08/2023 09:05        Scheduled Meds:  doxycycline  100 mg Oral Q12H   enoxaparin (LOVENOX) injection  30 mg Subcutaneous Q24H   famotidine  20 mg Oral Daily   fesoterodine  4 mg Oral Daily   gabapentin  100 mg Oral QHS   guaiFENesin  1,200  mg Oral BID   ibuprofen  400 mg Oral Q breakfast   latanoprost  1 drop Both Eyes QHS   levothyroxine  100 mcg Oral QAC breakfast   mometasone-formoterol  2 puff Inhalation BID   multivitamin with minerals  1 tablet Oral Daily   phosphorus  500 mg Oral TID   potassium chloride  40 mEq Oral Once   pravastatin  40 mg Oral Daily   tamoxifen  20 mg Oral Daily   traMADol  50 mg Oral Q6H   Continuous Infusions:  cefTRIAXone (ROCEPHIN)  IV 1 g (10/09/23 1622)     LOS: 0 days    Time spent: 35 minutes    Kailani Brass A Nazaiah Navarrete, MD [facility]   If 7PM-7AM, please contact night-coverage [email or web address]  10/10/2023, 8:23 AM

## 2023-10-10 NOTE — Progress Notes (Addendum)
SATURATION QUALIFICATIONS: (This note is used to comply with regulatory documentation for home oxygen)  Patient Saturations on Room Air at Rest = 90%  Patient Saturations on Room Air while Ambulating = 85%  Please briefly explain why patient needs home oxygen: Pt is unable to maintain O2 sats >85% on RA during functional mobility. Pt required 3L/min supplemental O2 to recover sats to 93%.   Full PT note to follow.   Conni Slipper, PT, DPT Acute Rehabilitation Services Secure Chat Preferred Office: [phone]

## 2023-10-11 DIAGNOSIS — R0600 Dyspnea, unspecified: Secondary | ICD-10-CM | POA: Diagnosis not present

## 2023-10-11 LAB — CULTURE, RESPIRATORY W GRAM STAIN: Culture: NORMAL

## 2023-10-11 LAB — CBC
HCT: 30.9 % — ABNORMAL LOW (ref 36.0–46.0)
Hemoglobin: 10.6 g/dL — ABNORMAL LOW (ref 12.0–15.0)
MCH: 32 pg (ref 26.0–34.0)
MCHC: 34.3 g/dL (ref 30.0–36.0)
MCV: 93.4 fL (ref 80.0–100.0)
Platelets: 179 10*3/uL (ref 150–400)
RBC: 3.31 MIL/uL — ABNORMAL LOW (ref 3.87–5.11)
RDW: 14.1 % (ref 11.5–15.5)
WBC: 7.5 10*3/uL (ref 4.0–10.5)
nRBC: 0 % (ref 0.0–0.2)

## 2023-10-11 LAB — MAGNESIUM: Magnesium: 2.2 mg/dL (ref 1.7–2.4)

## 2023-10-11 LAB — BASIC METABOLIC PANEL
Anion gap: 11 (ref 5–15)
BUN: 15 mg/dL (ref 8–23)
CO2: 21 mmol/L — ABNORMAL LOW (ref 22–32)
Calcium: 7.9 mg/dL — ABNORMAL LOW (ref 8.9–10.3)
Chloride: 100 mmol/L (ref 98–111)
Creatinine, Ser: 0.62 mg/dL (ref 0.44–1.00)
GFR, Estimated: >60 mL/min (ref >60)
Glucose, Bld: 171 mg/dL — ABNORMAL HIGH (ref 70–99)
Potassium: 3.4 mmol/L — ABNORMAL LOW (ref 3.5–5.1)
Sodium: 132 mmol/L — ABNORMAL LOW (ref 135–145)

## 2023-10-11 LAB — PHOSPHORUS
Phosphorus: 1.5 mg/dL — ABNORMAL LOW (ref 2.5–4.6)
Phosphorus: 3 mg/dL (ref 2.5–4.6)

## 2023-10-11 MED ORDER — POTASSIUM PHOSPHATES 15 MMOLE/5ML IV SOLN
45.0000 mmol | Freq: Once | INTRAVENOUS | Status: AC
  Administered 2023-10-11: 45 mmol via INTRAVENOUS
  Filled 2023-10-11: qty 15

## 2023-10-11 MED ORDER — IPRATROPIUM-ALBUTEROL 0.5-2.5 (3) MG/3ML IN SOLN
3.0000 mL | Freq: Three times a day (TID) | RESPIRATORY_TRACT | Status: DC
  Administered 2023-10-11 – 2023-10-12 (×4): 3 mL via RESPIRATORY_TRACT
  Filled 2023-10-11 (×4): qty 3

## 2023-10-11 MED ORDER — POTASSIUM CHLORIDE CRYS ER 20 MEQ PO TBCR
40.0000 meq | EXTENDED_RELEASE_TABLET | Freq: Once | ORAL | Status: AC
  Administered 2023-10-11: 40 meq via ORAL
  Filled 2023-10-11: qty 2

## 2023-10-11 NOTE — TOC Progression Note (Signed)
Transition of Care [facility]) - Progression Note    Patient Details  Name: [name] MRN: [id number] Date of Birth: 1931-09-20  Transition [facility]) CM/SW Contact  Carley Hammed, LCSW Phone Number: 10/11/2023, 10:57 AM  Clinical Narrative:    CSW was advised by RN that pt wanted to discuss SNF vs. HH at DC. CSW met with pt and discussed current recommendations. CSW also advised that pt is not meeting criteria for SNF and PT states that pt would likely do better at home. Pt notes understanding and states she briefly considered it as she was feeling sick last night, but feels better today. CSW and pt discussed personal care services. Pt requested CSW speak with pt's son. CSW spoke with Merlyn Albert who is also in agreement for pt to DC home. Son will provide transport but was concerned about coming out today. Per MD plan is for pt to DC tomorrow. CSW updated family. TOC will continue to follow.         Expected Discharge Plan and Services                                               Social Determinants of Health (SDOH) Interventions SDOH Screenings   Food Insecurity: No Food Insecurity (10/08/2023)  Housing: Patient Declined (10/08/2023)  Transportation Needs: No Transportation Needs (10/08/2023)  Utilities: Patient Declined (10/08/2023)  Social Connections: Unknown (10/08/2023)  Tobacco Use: Medium Risk (10/08/2023)    Readmission Risk Interventions     No data to display

## 2023-10-11 NOTE — Progress Notes (Signed)
Mobility Specialist Progress Note:   10/11/23 1209  Mobility  Activity Ambulated with assistance in hallway  Level of Assistance Contact guard assist, steadying assist  Assistive Device Front wheel walker  Distance Ambulated (ft) 120 ft  Activity Response Tolerated well  Mobility Referral Yes  Mobility visit 1 Mobility  Mobility Specialist Start Time (ACUTE ONLY) 1135  Mobility Specialist Stop Time (ACUTE ONLY) 1147  Mobility Specialist Time Calculation (min) (ACUTE ONLY) 12 min   Pre Mobility: 95% SpO2 2 L During Mobility: 88%- 95% SpO2 2 L Post Mobility: 95% SpO2 2 L  Pt received in bed, agreeable to mobility. SB bed mobility. CG during ambulation. Ambulated on 2 L. VSS throughout. C/o slight SOB, otherwise asx throughout. Pursed lip breathing encouraged. Pt returned to bed with call bell in reach and all needs met.    [name]  Mobility Specialist Please contact via [employer] office at [phone]

## 2023-10-11 NOTE — TOC Progression Note (Signed)
Transition of Care (TOC) - Progression Note   Patient has decided she does want shower seat , same ordered with Earna Coder with [employer]  Patient Details  Name: Marissa Caldwell MRN: [id number] Date of Birth: 03/27/1932  Transition of Care [facility]) CM/SW Contact  Kingsley Plan, RN Phone Number: 10/11/2023, 3:40 PM  Clinical Narrative:            Expected Discharge Plan and Services                                               Social Determinants of Health (SDOH) Interventions SDOH Screenings   Food Insecurity: No Food Insecurity (10/08/2023)  Housing: Patient Declined (10/08/2023)  Transportation Needs: No Transportation Needs (10/08/2023)  Utilities: Patient Declined (10/08/2023)  Social Connections: Unknown (10/08/2023)  Tobacco Use: Medium Risk (10/08/2023)    Readmission Risk Interventions     No data to display

## 2023-10-11 NOTE — Progress Notes (Signed)
PROGRESS NOTE    Marissa Caldwell  YQM:[id number] DOB: [birth year] DOA: 10/08/2023 PCP: Laurann Montana, MD   Brief Narrative: 89 year old with past medical history significant for CAD status post PCI, legally blind, GERD, colon cancer status post right colectomy, breast cancer status postlumpectomy and radiotherapy, lung cancer s/p radiation, hypertension, hyperlipidemia, hypothyroidism, COPD, anxiety, chronic respiratory failure on home oxygen at night only presented with worsening shortness of breath, cough, found to have pneumonia and RSV positive.  Started on empiric antibiotics.  Assessment & Plan:   Principal Problem:   Community acquired pneumonia Active Problems:   Dyspnea   1-Community-acquired pneumonia, RSV positive infection  Patient presented with cough, chest x-ray atelectasis versus mucous plugging in the setting of lung cancer.  RSV positive. -Continue ceftriaxone and doxycycline -Follow blood cultures: no growth to date, sputum culture: no growth to date.  -Continue Mucinex Improving, continue current management.   RSV infection: Continue with supportive care  Acute on chronic hypoxic respiratory failure, she uses 3 L of oxygen at bedtime Requiring oxygen at rest and on ambulation Home oxygen ordered.   COPD without exacerbation: Continue with Dulera and DuoNeb  Hypokalemia Replete   Hypothyroidism -Continue Synthroid   Hyperlipidemia -Continue pravastatin   History of breast cancer S/p right breast lumpectomy, adjuvant radiotherapy and is currently on Tamoxifen -Continue tamoxifen   History of lung cancer Right upper lobe mass. Patient follows with radiation oncology for radiation therapy.   History of right colon cancer History of right colectomy.   Chronic pain -Continue Tramadol and gabapentin   Anxiety -Continue Ativan as needed   Hypophosphatemia -phosphorus supplementation IV    GERD -Continue famotidine   Hyponatremia;  monitor     Estimated body mass index is 19.05 kg/m as calculated from the following:   Height as of 04/17/23: 5\' 10"  (1.778 m).   Weight as of 04/17/23: 60.2 kg.   DVT prophylaxis: Lovenox Code Status: DNR Family Communication: Son updated Disposition Plan:  Status is: Observation The patient remains OBS appropriate and will d/c before 2 midnights.    Consultants:  none  Procedures:  none  Antimicrobials:    Subjective: She is feeling better, report cough.    Objective: Vitals:   10/10/23 0837 10/10/23 2119 10/11/23 0448 10/11/23 0825  BP: (!) 112/59 (!) 158/74 (!) 150/70 131/70  Pulse: 69 81 73 73  Resp: 17 16 16  (!) 22  Temp: 98.3 F (36.8 C) (!) 97.5 F (36.4 C) 98.2 F (36.8 C) 98.5 F (36.9 C)  TempSrc:  Oral Oral Oral  SpO2: 98% 93% 96% 97%    Intake/Output Summary (Last 24 hours) at 10/11/2023 1420 Last data filed at 10/11/2023 1117 Gross per 24 hour  Intake 256.47 ml  Output --  Net 256.47 ml   There were no vitals filed for this visit.  Examination:  General exam: NAD Respiratory system: BL [place].  Cardiovascular system: S 1, S 2  RRR Gastrointestinal system: BS present, soft, nt Central nervous system: Alert Extremities: Symmetric 5 x 5 power.   Data Reviewed: I have personally reviewed following labs and imaging studies  CBC: Recent Labs  Lab 10/08/23 0852 10/09/23 0658 10/11/23 0926  WBC 13.3* 10.0 7.5  NEUTROABS 11.6*  --   --   HGB 12.6 11.5* 10.6*  HCT 37.1 33.7* 30.9*  MCV 94.6 92.8 93.4  PLT 231 183 179   Basic Metabolic Panel: Recent Labs  Lab 10/08/23 1016 10/09/23 0658 10/10/23 0555 10/11/23 0926  NA 134* 133*  130* 132*  K 3.3* 3.8 3.3* 3.4*  CL 100 100 98 100  CO2 21* 21* 23 21*  GLUCOSE 123* 109* 98 171*  BUN 20 22 20 15   CREATININE 0.53 0.68 1.00 0.62  CALCIUM 8.4* 8.2* 8.0* 7.9*  MG  --  2.1  --  2.2  PHOS  --  2.1* 2.2* 1.5*   GFR: CrCl cannot be calculated (Unknown ideal weight.). Liver  Function Tests: Recent Labs  Lab 10/08/23 1016  AST 24  ALT 14  ALKPHOS 43  BILITOT 0.8  PROT 6.6  ALBUMIN 3.2*   No results for input(s): "LIPASE", "AMYLASE" in the last 168 hours. No results for input(s): "AMMONIA" in the last 168 hours. Coagulation Profile: No results for input(s): "INR", "PROTIME" in the last 168 hours. Cardiac Enzymes: No results for input(s): "CKTOTAL", "CKMB", "CKMBINDEX", "TROPONINI" in the last 168 hours. BNP (last 3 results) No results for input(s): "PROBNP" in the last 8760 hours. HbA1C: No results for input(s): "HGBA1C" in the last 72 hours. CBG: No results for input(s): "GLUCAP" in the last 168 hours. Lipid Profile: No results for input(s): "CHOL", "HDL", "LDLCALC", "TRIG", "CHOLHDL", "LDLDIRECT" in the last 72 hours. Thyroid Function Tests: No results for input(s): "TSH", "T4TOTAL", "FREET4", "T3FREE", "THYROIDAB" in the last 72 hours. Anemia Panel: No results for input(s): "VITAMINB12", "FOLATE", "FERRITIN", "TIBC", "IRON", "RETICCTPCT" in the last 72 hours. Sepsis Labs: Recent Labs  Lab 10/08/23 0859 10/08/23 1024 10/09/23 0658  PROCALCITON  --   --  0.16  LATICACIDVEN 1.6 1.0  --     Recent Results (from the past 240 hours)  Resp panel by RT-PCR (RSV, Flu A&B, Covid) Anterior Nasal Swab     Status: None   Collection Time: 10/08/23  8:52 AM   Specimen: Anterior Nasal Swab  Result Value Ref Range Status   SARS Coronavirus 2 by RT PCR NEGATIVE NEGATIVE Final   Influenza A by PCR NEGATIVE NEGATIVE Final   Influenza B by PCR NEGATIVE NEGATIVE Final    Comment: (NOTE) The Xpert Xpress SARS-CoV-2/FLU/RSV plus assay is intended as an aid in the diagnosis of influenza from Nasopharyngeal swab specimens and should not be used as a sole basis for treatment. Nasal washings and aspirates are unacceptable for Xpert Xpress SARS-CoV-2/FLU/RSV testing.  Fact Sheet for Patients: BloggerCourse.com  Fact Sheet for  Healthcare Providers: SeriousBroker.it  This test is not yet approved or cleared by the [place] FDA and has been authorized for detection and/or diagnosis of SARS-CoV-2 by FDA under an Emergency Use Authorization (EUA). This EUA will remain in effect (meaning this test can be used) for the duration of the COVID-19 declaration under Section 564(b)(1) of the Act, 21 U.S.C. section 360bbb-3(b)(1), unless the authorization is terminated or revoked.     Resp Syncytial Virus by PCR NEGATIVE NEGATIVE Final    Comment: (NOTE) Fact Sheet for Patients: BloggerCourse.com  Fact Sheet for Healthcare Providers: SeriousBroker.it  This test is not yet approved or cleared by the [place] FDA and has been authorized for detection and/or diagnosis of SARS-CoV-2 by FDA under an Emergency Use Authorization (EUA). This EUA will remain in effect (meaning this test can be used) for the duration of the COVID-19 declaration under Section 564(b)(1) of the Act, 21 U.S.C. section 360bbb-3(b)(1), unless the authorization is terminated or revoked.  Performed at [facility], [address], [place], [place] [address]   Culture, blood (routine x 2)     Status: None (Preliminary result)  Collection Time: 10/08/23  8:52 AM   Specimen: BLOOD LEFT ARM  Result Value Ref Range Status   Specimen Description BLOOD LEFT ARM  Final   Special Requests   Final    BOTTLES DRAWN AEROBIC ONLY Blood Culture results may not be optimal due to an inadequate volume of blood received in culture bottles   Culture   Final    NO GROWTH 3 DAYS Performed at [facility], [address], [place], [place] [address]    Report Status PENDING  Incomplete  Culture, blood (routine x 2)     Status: None (Preliminary result)   Collection Time: 10/08/23  9:09 AM   Specimen: BLOOD LEFT HAND  Result Value Ref Range Status   Specimen  Description BLOOD LEFT HAND  Final   Special Requests BOTTLES DRAWN AEROBIC ONLY  Final   Culture   Final    NO GROWTH 3 DAYS Performed at [facility], [address], [place], [place] [address]    Report Status PENDING  Incomplete  Respiratory (~20 pathogens) panel by PCR     Status: Abnormal   Collection Time: 10/08/23  8:04 PM   Specimen: Nasopharyngeal Swab; Respiratory  Result Value Ref Range Status   Adenovirus NOT DETECTED NOT DETECTED Final   Coronavirus 229E NOT DETECTED NOT DETECTED Final    Comment: (NOTE) The Coronavirus on the Respiratory Panel, DOES NOT test for the novel  Coronavirus (2019 nCoV)    Coronavirus HKU1 NOT DETECTED NOT DETECTED Final   Coronavirus NL63 NOT DETECTED NOT DETECTED Final   Coronavirus OC43 NOT DETECTED NOT DETECTED Final   Metapneumovirus NOT DETECTED NOT DETECTED Final   Rhinovirus / Enterovirus NOT DETECTED NOT DETECTED Final   Influenza A NOT DETECTED NOT DETECTED Final   Influenza B NOT DETECTED NOT DETECTED Final   Parainfluenza Virus 1 NOT DETECTED NOT DETECTED Final   Parainfluenza Virus 2 NOT DETECTED NOT DETECTED Final   Parainfluenza Virus 3 NOT DETECTED NOT DETECTED Final   Parainfluenza Virus 4 NOT DETECTED NOT DETECTED Final   Respiratory Syncytial Virus DETECTED (A) NOT DETECTED Final   Bordetella pertussis NOT DETECTED NOT DETECTED Final   Bordetella Parapertussis NOT DETECTED NOT DETECTED Final   Chlamydophila pneumoniae NOT DETECTED NOT DETECTED Final   Mycoplasma pneumoniae NOT DETECTED NOT DETECTED Final    Comment: Performed at [facility], [address], [place], [place] [address]  MRSA Next Gen by PCR, Nasal     Status: None   Collection Time: 10/08/23  8:04 PM   Specimen: Nasal Mucosa; Nasal Swab  Result Value Ref Range Status   MRSA by PCR Next Gen NOT DETECTED NOT DETECTED Final    Comment: (NOTE) The GeneXpert MRSA Assay (FDA approved for NASAL specimens only), is one component of a  comprehensive MRSA colonization surveillance program. It is not intended to diagnose MRSA infection nor to guide or monitor treatment for MRSA infections. Test performance is not FDA approved in patients less than 52 years old. Performed at [facility], [address], [place], [place] [address]   Expectorated Sputum Assessment w Gram Stain, Rflx to Resp Cult     Status: None   Collection Time: 10/09/23  6:00 AM   Specimen: Expectorated Sputum  Result Value Ref Range Status   Specimen Description EXPECTORATED SPUTUM  Final   Special Requests NONE  Final   Sputum evaluation   Final    THIS SPECIMEN IS ACCEPTABLE  FOR SPUTUM CULTURE Performed at [facility], [address], [place], [place] [address]    Report Status 10/09/2023 FINAL  Final  Culture, Respiratory w Gram Stain     Status: None   Collection Time: 10/09/23  6:00 AM  Result Value Ref Range Status   Specimen Description EXPECTORATED SPUTUM  Final   Special Requests NONE Reflexed from [id number]  Final   Gram Stain   Final    RARE SQUAMOUS EPITHELIAL CELLS PRESENT FEW WBC PRESENT,BOTH PMN AND MONONUCLEAR RARE GRAM VARIABLE ROD    Culture   Final    RARE Normal respiratory flora-no Staph aureus or Pseudomonas seen Performed at [facility], [address] [place], [place] [address]    Report Status 10/11/2023 FINAL  Final         Radiology Studies: No results found.       Scheduled Meds:  doxycycline  100 mg Oral Q12H   enoxaparin (LOVENOX) injection  30 mg Subcutaneous Q24H   famotidine  20 mg Oral Daily   fesoterodine  4 mg Oral Daily   gabapentin  100 mg Oral QHS   guaiFENesin  1,200 mg Oral BID   ibuprofen  400 mg Oral Q breakfast   ipratropium-albuterol  3 mL Nebulization TID   latanoprost  1 drop Both Eyes QHS   levothyroxine  100 mcg Oral QAC breakfast   mometasone-formoterol  2 puff Inhalation BID   multivitamin with minerals  1 tablet Oral Daily   pravastatin  40 mg Oral  Daily   tamoxifen  20 mg Oral Daily   traMADol  50 mg Oral Q6H   Continuous Infusions:  cefTRIAXone (ROCEPHIN)  IV 1 g (10/10/23 1821)   potassium PHOSPHATE IVPB (in mmol) 45 mmol (10/11/23 1202)     LOS: 1 day    Time spent: 35 minutes    Carl Butner A Monia Timmers, MD [facility]   If 7PM-7AM, please contact night-coverage [email or web address]  10/11/2023, 2:20 PM

## 2023-10-11 NOTE — Progress Notes (Signed)
Physical Therapy Treatment  Patient Details Name: Marissa Caldwell MRN: [id number] DOB: 06-May-1932 Today's Date: 10/11/2023   History of Present Illness Pt is a 89 y/o female who presents 10/08/2023 with SOB. She was found to have RSV and CAP. PMH significant for lung CA s/p radiation, COPD, CAD s/p PCI, legally blind, colon CA s/p R colectomy, breast CA s/p radiation, HTN, hypothyroidism, chronic respiratory failure on 3L/min supplemental O2 at night.    PT Comments  Pt progressing towards physical therapy goals. Was able to perform transfers and ambulation with modified independence to CGA and RW for support. Pt occasionally dyspneic with functional mobility/exercise, however able to maintain SpO2 ~93% with 2L/min supplemental O2 donned. On RA, sats decreased to 80% after walk to the bathroom, standing at the sink to wash hands, and walk back to the bed. Pt states she changed her mind about wanting a seat for her shower. TOC updated after session. Will continue to follow.    If plan is discharge home, recommend the following: A little help with walking and/or transfers;A little help with bathing/dressing/bathroom;Assistance with cooking/housework;Assist for transportation;Help with stairs or ramp for entrance   Can travel by private vehicle        Equipment Recommendations  Rolling walker (2 wheels)    Recommendations for Other Services       Precautions / Restrictions Precautions Precautions: Fall Recall of Precautions/Restrictions: Intact Precaution/Restrictions Comments: HOH, legally blind Restrictions Weight Bearing Restrictions Per Provider Order: No     Mobility  Bed Mobility Overal bed mobility: Modified Independent             General bed mobility comments: No assist required to transition to/from EOB.    Transfers Overall transfer level: Needs assistance Equipment used: Rolling walker (2 wheels) Transfers: Sit to/from Stand Sit to Stand: Contact guard assist,  Supervision           General transfer comment: VC's for hand placement on seated surface for safety. pt able to power up to full stand without assist. Hands on guarding for stand>sit as pt not making corrective changes to reach back before initiating sit.    Ambulation/Gait Ambulation/Gait assistance: Contact guard assist Gait Distance (Feet): 30 Feet Assistive device: Rolling walker (2 wheels) Gait Pattern/deviations: Step-through pattern, Decreased stride length, Trunk flexed Gait velocity: Decreased Gait velocity interpretation: 1.31 - 2.62 ft/sec, indicative of limited community ambulator   General Gait Details: In room only as pt recently ambulated with mobility specialist. Pt ambualted to and from the bathroom on RA. Noted pt at 80% SpO2 upon return to bed.   Stairs             Wheelchair Mobility     Tilt Bed    Modified Rankin (Stroke Patients Only)       Balance Overall balance assessment: Needs assistance Sitting-balance support: Feet supported, No upper extremity supported Sitting balance-Leahy Scale: Fair     Standing balance support: No upper extremity supported, During functional activity Standing balance-Leahy Scale: Fair Standing balance comment: statically - fair; dynamically - poor                            Communication Communication Communication: Impaired Factors Affecting Communication: Hearing impaired  Cognition Arousal: Alert Behavior During Therapy: WFL for tasks assessed/performed   PT - Cognitive impairments: No apparent impairments  Following commands: Intact      Cueing Cueing Techniques: Verbal cues, Gestural cues, Tactile cues  Exercises Other Exercises Other Exercises: 5x sit to stand with 2L/min supplemental O2 donned. Sats 93% and pt with 3/4 DOE.    General Comments        Pertinent Vitals/Pain Pain Assessment Pain Assessment: No/denies pain    Home Living                           Prior Function            PT Goals (current goals can now be found in the care plan section) Acute Rehab PT Goals Patient Stated Goal: Return to her home PT Goal Formulation: With patient Time For Goal Achievement: 10/17/23 Potential to Achieve Goals: Good Progress towards PT goals: Progressing toward goals    Frequency    Min 1X/week      PT Plan      Co-evaluation              AM-PAC PT "6 Clicks" Mobility   Outcome Measure  Help needed turning from your back to your side while in a flat bed without using bedrails?: None Help needed moving from lying on your back to sitting on the side of a flat bed without using bedrails?: None Help needed moving to and from a bed to a chair (including a wheelchair)?: A Little Help needed standing up from a chair using your arms (e.g., wheelchair or bedside chair)?: A Little Help needed to walk in hospital room?: A Little Help needed climbing 3-5 steps with a railing? : A Little 6 Click Score: 20    End of Session Equipment Utilized During Treatment: Gait belt;Oxygen Activity Tolerance: Patient tolerated treatment well Patient left: in bed;with call bell/phone within reach;with bed alarm set Nurse Communication: Mobility status PT Visit Diagnosis: Unsteadiness on feet (R26.81);Difficulty in walking, not elsewhere classified (R26.2)     Time: 4696-2952 PT Time Calculation (min) (ACUTE ONLY): 25 min  Charges:    $Gait Training: 23-37 mins PT General Charges $$ ACUTE PT VISIT: 1 Visit                     Conni Slipper, PT, DPT Acute Rehabilitation Services Secure Chat Preferred Office: [phone]    Marylynn Pearson 10/11/2023, 3:32 PM

## 2023-10-12 DIAGNOSIS — R0600 Dyspnea, unspecified: Secondary | ICD-10-CM | POA: Diagnosis not present

## 2023-10-12 LAB — BASIC METABOLIC PANEL
Anion gap: 8 (ref 5–15)
BUN: 10 mg/dL (ref 8–23)
CO2: 21 mmol/L — ABNORMAL LOW (ref 22–32)
Calcium: 8.1 mg/dL — ABNORMAL LOW (ref 8.9–10.3)
Chloride: 103 mmol/L (ref 98–111)
Creatinine, Ser: 0.58 mg/dL (ref 0.44–1.00)
GFR, Estimated: >60 mL/min (ref >60)
Glucose, Bld: 123 mg/dL — ABNORMAL HIGH (ref 70–99)
Potassium: 4.5 mmol/L (ref 3.5–5.1)
Sodium: 132 mmol/L — ABNORMAL LOW (ref 135–145)

## 2023-10-12 LAB — AMMONIA: Ammonia: 25 umol/L (ref 9–35)

## 2023-10-12 MED ORDER — TRAMADOL HCL 50 MG PO TABS
25.0000 mg | ORAL_TABLET | Freq: Three times a day (TID) | ORAL | Status: DC
  Administered 2023-10-12: 25 mg via ORAL

## 2023-10-12 MED ORDER — ARFORMOTEROL TARTRATE 15 MCG/2ML IN NEBU
15.0000 ug | INHALATION_SOLUTION | Freq: Two times a day (BID) | RESPIRATORY_TRACT | Status: DC
  Administered 2023-10-12 – 2023-10-14 (×4): 15 ug via RESPIRATORY_TRACT
  Filled 2023-10-12 (×5): qty 2

## 2023-10-12 MED ORDER — SODIUM CHLORIDE 0.9% FLUSH: 10.0000 mL | INTRAVENOUS | Status: DC | PRN

## 2023-10-12 MED ORDER — BUDESONIDE 0.25 MG/2ML IN SUSP
0.2500 mg | Freq: Two times a day (BID) | RESPIRATORY_TRACT | Status: DC
  Administered 2023-10-12 – 2023-10-14 (×4): 0.25 mg via RESPIRATORY_TRACT
  Filled 2023-10-12 (×5): qty 2

## 2023-10-12 MED ORDER — SODIUM CHLORIDE 0.9 % IV SOLN
1.0000 g | Freq: Once | INTRAVENOUS | Status: AC
  Administered 2023-10-12: 1 g via INTRAVENOUS
  Filled 2023-10-12: qty 10

## 2023-10-12 MED ORDER — AMLODIPINE BESYLATE 5 MG PO TABS
5.0000 mg | ORAL_TABLET | Freq: Every day | ORAL | Status: DC
  Administered 2023-10-12 – 2023-10-14 (×3): 5 mg via ORAL
  Filled 2023-10-12 (×3): qty 1

## 2023-10-12 MED ORDER — SODIUM CHLORIDE 0.9% FLUSH
10.0000 mL | Freq: Two times a day (BID) | INTRAVENOUS | Status: DC
  Administered 2023-10-12: 10 mL

## 2023-10-12 MED ORDER — IPRATROPIUM-ALBUTEROL 0.5-2.5 (3) MG/3ML IN SOLN
3.0000 mL | Freq: Four times a day (QID) | RESPIRATORY_TRACT | Status: DC | PRN
  Administered 2023-10-12: 3 mL via RESPIRATORY_TRACT

## 2023-10-12 NOTE — Plan of Care (Signed)
  Problem: Nutrition: Goal: Adequate nutrition will be maintained Outcome: Progressing   Problem: Coping: Goal: Level of anxiety will decrease Outcome: Progressing   Problem: Pain Managment: Goal: General experience of comfort will improve and/or be controlled Outcome: Progressing   Problem: Skin Integrity: Goal: Risk for impaired skin integrity will decrease Outcome: Progressing   Problem: Respiratory: Goal: Ability to maintain adequate ventilation will improve Outcome: Progressing

## 2023-10-12 NOTE — Plan of Care (Signed)
  Problem: Activity: Goal: Risk for activity intolerance will decrease Outcome: Progressing   Problem: Nutrition: Goal: Adequate nutrition will be maintained Outcome: Progressing   Problem: Pain Managment: Goal: General experience of comfort will improve and/or be controlled Outcome: Progressing   Problem: Safety: Goal: Ability to remain free from injury will improve Outcome: Progressing   Problem: Skin Integrity: Goal: Risk for impaired skin integrity will decrease Outcome: Progressing   Problem: Respiratory: Goal: Ability to maintain a clear airway will improve Outcome: Progressing

## 2023-10-12 NOTE — Progress Notes (Signed)

## 2023-10-12 NOTE — Progress Notes (Addendum)
PROGRESS NOTE    Marissa Caldwell  ZOX:[id number] DOB: 03/12/1932 DOA: 10/08/2023 PCP: Laurann Montana, MD   Brief Narrative: 89 year old with past medical history significant for CAD status post PCI, legally blind, GERD, colon cancer status post right colectomy, breast cancer status postlumpectomy and radiotherapy, lung cancer s/p radiation, hypertension, hyperlipidemia, hypothyroidism, COPD, anxiety, chronic respiratory failure on home oxygen at night only presented with worsening shortness of breath, cough, found to have pneumonia and RSV positive.  Started on empiric antibiotics.  Assessment & Plan:   Principal Problem:   Community acquired pneumonia Active Problems:   Dyspnea   1-Community-acquired pneumonia, RSV positive infection  Patient presented with cough, chest x-ray atelectasis versus mucous plugging in the setting of lung cancer.  RSV positive. -Continue ceftriaxone and doxycycline -Follow blood cultures: no growth to date, sputum culture: no growth to date.  -Continue Mucinex Improving, continue current management.   RSV infection: Continue with supportive care.  Acute on chronic hypoxic respiratory failure, she uses 3 L of oxygen at bedtime Requiring oxygen at rest and on ambulation Home oxygen ordered.   COPD without exacerbation: Continue with Duo-Neb. Change dulera  to pulmicort and Brovana  Hypokalemia Replete   Hypothyroidism -Continue Synthroid   Hyperlipidemia -Continue pravastatin   History of breast cancer S/p right breast lumpectomy, adjuvant radiotherapy and is currently on Tamoxifen -Continue tamoxifen   History of lung cancer Right upper lobe mass. Patient follows with radiation oncology for radiation therapy.   History of right colon cancer History of right colectomy.   Chronic pain -Continue Tramadol and gabapentin   Anxiety -Continue Ativan as needed   Hypophosphatemia -phosphorus supplementation IV    GERD -Continue  famotidine   Hyponatremia; monitor  HTN; start Low dose Norvasc. Hallucinations; Reduce tramadol dose. Will be in the setting of delirium, acute illness CO2 not significantly elevated Oxygen saturation 100% on 2 L  Estimated body mass index is 19.05 kg/m as calculated from the following:   Height as of 04/17/23: 5\' 10"  (1.778 m).   Weight as of 04/17/23: 60.2 kg.   DVT prophylaxis: Lovenox Code Status: DNR Family Communication: Son updated 2/20 Disposition Plan:  Status is: Observation The patient remains OBS appropriate and will d/c before 2 midnights.    Consultants:  none  Procedures:  none  Antimicrobials:    Subjective: She appears to be SOB today. Still coughing. Report having hallucination and nightmare at night.  Objective: Vitals:   10/11/23 2041 10/12/23 0430 10/12/23 0825 10/12/23 0826  BP: 136/68 (!) 150/76  (!) 160/80  Pulse: 81 73  77  Resp: 16 15  17   Temp: 98 F (36.7 C) 98.3 F (36.8 C)  98.8 F (37.1 C)  TempSrc: Oral Oral  Oral  SpO2: 97% 96% 98% 100%    Intake/Output Summary (Last 24 hours) at 10/12/2023 1225 Last data filed at 10/12/2023 0500 Gross per 24 hour  Intake 1076.37 ml  Output 450 ml  Net 626.37 ml   There were no vitals filed for this visit.  Examination:  General exam: NAD Respiratory system; BL [place] Cardiovascular system: S 1, S 2 RRR Gastrointestinal system: BS present, soft, nt Central nervous system:Alert  Data Reviewed: I have personally reviewed following labs and imaging studies  CBC: Recent Labs  Lab 10/08/23 0852 10/09/23 0658 10/11/23 0926  WBC 13.3* 10.0 7.5  NEUTROABS 11.6*  --   --   HGB 12.6 11.5* 10.6*  HCT 37.1 33.7* 30.9*  MCV 94.6 92.8 93.4  PLT  231 183 179   Basic Metabolic Panel: Recent Labs  Lab 10/08/23 1016 10/09/23 0658 10/10/23 0555 10/11/23 0926 10/11/23 2152 10/12/23 0926  NA 134* 133* 130* 132*  --  132*  K 3.3* 3.8 3.3* 3.4*  --  4.5  CL 100 100 98 100  --  103   CO2 21* 21* 23 21*  --  21*  GLUCOSE 123* 109* 98 171*  --  123*  BUN 20 22 20 15   --  10  CREATININE 0.53 0.68 1.00 0.62  --  0.58  CALCIUM 8.4* 8.2* 8.0* 7.9*  --  8.1*  MG  --  2.1  --  2.2  --   --   PHOS  --  2.1* 2.2* 1.5* 3.0  --    GFR: CrCl cannot be calculated (Unknown ideal weight.). Liver Function Tests: Recent Labs  Lab 10/08/23 1016  AST 24  ALT 14  ALKPHOS 43  BILITOT 0.8  PROT 6.6  ALBUMIN 3.2*   No results for input(s): "LIPASE", "AMYLASE" in the last 168 hours. No results for input(s): "AMMONIA" in the last 168 hours. Coagulation Profile: No results for input(s): "INR", "PROTIME" in the last 168 hours. Cardiac Enzymes: No results for input(s): "CKTOTAL", "CKMB", "CKMBINDEX", "TROPONINI" in the last 168 hours. BNP (last 3 results) No results for input(s): "PROBNP" in the last 8760 hours. HbA1C: No results for input(s): "HGBA1C" in the last 72 hours. CBG: No results for input(s): "GLUCAP" in the last 168 hours. Lipid Profile: No results for input(s): "CHOL", "HDL", "LDLCALC", "TRIG", "CHOLHDL", "LDLDIRECT" in the last 72 hours. Thyroid Function Tests: No results for input(s): "TSH", "T4TOTAL", "FREET4", "T3FREE", "THYROIDAB" in the last 72 hours. Anemia Panel: No results for input(s): "VITAMINB12", "FOLATE", "FERRITIN", "TIBC", "IRON", "RETICCTPCT" in the last 72 hours. Sepsis Labs: Recent Labs  Lab 10/08/23 0859 10/08/23 1024 10/09/23 0658  PROCALCITON  --   --  0.16  LATICACIDVEN 1.6 1.0  --     Recent Results (from the past 240 hours)  Resp panel by RT-PCR (RSV, Flu A&B, Covid) Anterior Nasal Swab     Status: None   Collection Time: 10/08/23  8:52 AM   Specimen: Anterior Nasal Swab  Result Value Ref Range Status   SARS Coronavirus 2 by RT PCR NEGATIVE NEGATIVE Final   Influenza A by PCR NEGATIVE NEGATIVE Final   Influenza B by PCR NEGATIVE NEGATIVE Final    Comment: (NOTE) The Xpert Xpress SARS-CoV-2/FLU/RSV plus assay is intended as an  aid in the diagnosis of influenza from Nasopharyngeal swab specimens and should not be used as a sole basis for treatment. Nasal washings and aspirates are unacceptable for Xpert Xpress SARS-CoV-2/FLU/RSV testing.  Fact Sheet for Patients: BloggerCourse.com  Fact Sheet for Healthcare Providers: SeriousBroker.it  This test is not yet approved or cleared by the [place] FDA and has been authorized for detection and/or diagnosis of SARS-CoV-2 by FDA under an Emergency Use Authorization (EUA). This EUA will remain in effect (meaning this test can be used) for the duration of the COVID-19 declaration under Section 564(b)(1) of the Act, 21 U.S.C. section 360bbb-3(b)(1), unless the authorization is terminated or revoked.     Resp Syncytial Virus by PCR NEGATIVE NEGATIVE Final    Comment: (NOTE) Fact Sheet for Patients: BloggerCourse.com  Fact Sheet for Healthcare Providers: SeriousBroker.it  This test is not yet approved or cleared by the [place] FDA and has been authorized for detection and/or diagnosis of SARS-CoV-2 by FDA under an Emergency  Use Authorization (EUA). This EUA will remain in effect (meaning this test can be used) for the duration of the COVID-19 declaration under Section 564(b)(1) of the Act, 21 U.S.C. section 360bbb-3(b)(1), unless the authorization is terminated or revoked.  Performed at [facility], [address] [place], [place] [address]   Culture, blood (routine x 2)     Status: None (Preliminary result)   Collection Time: 10/08/23  8:52 AM   Specimen: BLOOD LEFT ARM  Result Value Ref Range Status   Specimen Description BLOOD LEFT ARM  Final   Special Requests   Final    BOTTLES DRAWN AEROBIC ONLY Blood Culture results may not be optimal due to an inadequate volume of blood received in culture bottles   Culture   Final    NO GROWTH 4  DAYS Performed at [facility], [address], [place], [place] [address]    Report Status PENDING  Incomplete  Culture, blood (routine x 2)     Status: None (Preliminary result)   Collection Time: 10/08/23  9:09 AM   Specimen: BLOOD LEFT HAND  Result Value Ref Range Status   Specimen Description BLOOD LEFT HAND  Final   Special Requests BOTTLES DRAWN AEROBIC ONLY  Final   Culture   Final    NO GROWTH 4 DAYS Performed at [facility], [address] [place], [place] [address]    Report Status PENDING  Incomplete  Respiratory (~20 pathogens) panel by PCR     Status: Abnormal   Collection Time: 10/08/23  8:04 PM   Specimen: Nasopharyngeal Swab; Respiratory  Result Value Ref Range Status   Adenovirus NOT DETECTED NOT DETECTED Final   Coronavirus 229E NOT DETECTED NOT DETECTED Final    Comment: (NOTE) The Coronavirus on the Respiratory Panel, DOES NOT test for the novel  Coronavirus (2019 nCoV)    Coronavirus HKU1 NOT DETECTED NOT DETECTED Final   Coronavirus NL63 NOT DETECTED NOT DETECTED Final   Coronavirus OC43 NOT DETECTED NOT DETECTED Final   Metapneumovirus NOT DETECTED NOT DETECTED Final   Rhinovirus / Enterovirus NOT DETECTED NOT DETECTED Final   Influenza A NOT DETECTED NOT DETECTED Final   Influenza B NOT DETECTED NOT DETECTED Final   Parainfluenza Virus 1 NOT DETECTED NOT DETECTED Final   Parainfluenza Virus 2 NOT DETECTED NOT DETECTED Final   Parainfluenza Virus 3 NOT DETECTED NOT DETECTED Final   Parainfluenza Virus 4 NOT DETECTED NOT DETECTED Final   Respiratory Syncytial Virus DETECTED (A) NOT DETECTED Final   Bordetella pertussis NOT DETECTED NOT DETECTED Final   Bordetella Parapertussis NOT DETECTED NOT DETECTED Final   Chlamydophila pneumoniae NOT DETECTED NOT DETECTED Final   Mycoplasma pneumoniae NOT DETECTED NOT DETECTED Final    Comment: Performed at [facility], [address], [place], [place] [address]  MRSA Next Gen by PCR, Nasal      Status: None   Collection Time: 10/08/23  8:04 PM   Specimen: Nasal Mucosa; Nasal Swab  Result Value Ref Range Status   MRSA by PCR Next Gen NOT DETECTED NOT DETECTED Final    Comment: (NOTE) The GeneXpert MRSA Assay (FDA approved for NASAL specimens only), is one component of a comprehensive MRSA colonization surveillance program. It is not intended to diagnose MRSA infection nor to guide or monitor treatment for MRSA infections. Test performance is not FDA approved in patients less than 55 years old. Performed at [facility], [address], [place], [place] [address]   Expectorated Sputum Assessment w Gram Stain, Rflx to Resp Cult     Status: None   Collection Time: 10/09/23  6:00 AM   Specimen: Expectorated Sputum  Result Value Ref Range Status   Specimen Description EXPECTORATED SPUTUM  Final   Special Requests NONE  Final   Sputum evaluation   Final    THIS SPECIMEN IS ACCEPTABLE FOR SPUTUM CULTURE Performed at [facility], [address] [place], [place] [address]    Report Status 10/09/2023 FINAL  Final  Culture, Respiratory w Gram Stain     Status: None   Collection Time: 10/09/23  6:00 AM  Result Value Ref Range Status   Specimen Description EXPECTORATED SPUTUM  Final   Special Requests NONE Reflexed from [id number]  Final   Gram Stain   Final    RARE SQUAMOUS EPITHELIAL CELLS PRESENT FEW WBC PRESENT,BOTH PMN AND MONONUCLEAR RARE GRAM VARIABLE ROD    Culture   Final    RARE Normal respiratory flora-no Staph aureus or Pseudomonas seen Performed at [facility], [address] [place], [place] [address]    Report Status 10/11/2023 FINAL  Final         Radiology Studies: No results found.       Scheduled Meds:  amLODipine  5 mg Oral Daily   arformoterol  15 mcg Nebulization BID   budesonide (PULMICORT) nebulizer solution  0.25 mg Nebulization BID   doxycycline  100 mg Oral Q12H   enoxaparin (LOVENOX) injection  30 mg Subcutaneous  Q24H   famotidine  20 mg Oral Daily   fesoterodine  4 mg Oral Daily   gabapentin  100 mg Oral QHS   guaiFENesin  1,200 mg Oral BID   ibuprofen  400 mg Oral Q breakfast   ipratropium-albuterol  3 mL Nebulization TID   latanoprost  1 drop Both Eyes QHS   levothyroxine  100 mcg Oral QAC breakfast   multivitamin with minerals  1 tablet Oral Daily   pravastatin  40 mg Oral Daily   tamoxifen  20 mg Oral Daily   traMADol  50 mg Oral Q6H   Continuous Infusions:  cefTRIAXone (ROCEPHIN)  IV 1 g (10/11/23 2039)     LOS: 2 days    Time spent: 35 minutes    Kaysee Hergert A Jendayi Berling, MD [facility]   If 7PM-7AM, please contact night-coverage [email or web address]  10/12/2023, 12:25 PM

## 2023-10-12 NOTE — Progress Notes (Signed)
OT Cancellation Note  Patient Details Name: Marissa Caldwell MRN: [id number] DOB: [birth year]   Cancelled Treatment:    Reason Eval/Treat Not Completed: Other (comment).  Attempted skilled OT treatment session.  RN present and son also.  Pt. Declined need for any review or participation in OT session stating "I think I'm getting ready to leave and I've been going to/from the b.room by myself since I've been here".  Reviewed other goals including caring for her dog and she reports no concerns and ready for home.    Alessandra Bevels Lorraine-COTA/L 10/12/2023, 1:31 PM

## 2023-10-12 NOTE — Progress Notes (Signed)
Physical Therapy Treatment Patient Details Name: Marissa Caldwell MRN: [id number] DOB: January 30, 1932 Today's Date: 10/12/2023   History of Present Illness Pt is a 89 y/o female who presents 10/08/2023 with SOB. She was found to have RSV and CAP. PMH significant for lung CA s/p radiation, COPD, CAD s/p PCI, legally blind, colon CA s/p R colectomy, breast CA s/p radiation, HTN, hypothyroidism, chronic respiratory failure on 3L/min supplemental O2 at night.    PT Comments  Pt tolerates treatment well, ambulating for increased distances. Pt does report DOE with attempts to wean to room air when mobilizing, sats dropping to 90%. Pt will benefit from continued frequent mobilization in an effort to improve activity tolerance and pulmonary function.  PT recommends HHPT at the time of discharge.   If plan is discharge home, recommend the following: A little help with walking and/or transfers;A little help with bathing/dressing/bathroom;Assistance with cooking/housework;Assist for transportation;Help with stairs or ramp for entrance   Can travel by private vehicle        Equipment Recommendations  Rolling walker (2 wheels)    Recommendations for Other Services       Precautions / Restrictions Precautions Precautions: Fall Recall of Precautions/Restrictions: Intact Precaution/Restrictions Comments: HOH, legally blind Restrictions Weight Bearing Restrictions Per Provider Order: No     Mobility  Bed Mobility Overal bed mobility: Modified Independent                  Transfers Overall transfer level: Modified independent Equipment used: Rolling walker (2 wheels) Transfers: Sit to/from Stand Sit to Stand: Modified independent (Device/Increase time)                Ambulation/Gait Ambulation/Gait assistance: Supervision Gait Distance (Feet): 150 Feet (150' x 2) Assistive device: Rolling walker (2 wheels) Gait Pattern/deviations: Step-through pattern Gait velocity:  Decreased Gait velocity interpretation: 1.31 - 2.62 ft/sec, indicative of limited community ambulator   General Gait Details: slowed step-through gait   Stairs             Wheelchair Mobility     Tilt Bed    Modified Rankin (Stroke Patients Only)       Balance Overall balance assessment: Needs assistance Sitting-balance support: No upper extremity supported, Feet supported Sitting balance-Leahy Scale: Good     Standing balance support: Single extremity supported, Reliant on assistive device for balance Standing balance-Leahy Scale: Poor                              Communication Communication Communication: Impaired Factors Affecting Communication: Hearing impaired  Cognition Arousal: Alert Behavior During Therapy: WFL for tasks assessed/performed   PT - Cognitive impairments: No apparent impairments                         Following commands: Intact      Cueing Cueing Techniques: Verbal cues, Gestural cues, Tactile cues  Exercises      General Comments General comments (skin integrity, edema, etc.): pt on 2L [place] at rest, weaned to room air with mobility. Pt desats to 90% with ambulation and reports DOE. Pt returned to 2L  at end of session      Pertinent Vitals/Pain Pain Assessment Pain Assessment: No/denies pain    Home Living                          Prior Function  PT Goals (current goals can now be found in the care plan section) Acute Rehab PT Goals Patient Stated Goal: Return to her home Progress towards PT goals: Progressing toward goals    Frequency    Min 1X/week      PT Plan      Co-evaluation              AM-PAC PT "6 Clicks" Mobility   Outcome Measure  Help needed turning from your back to your side while in a flat bed without using bedrails?: None Help needed moving from lying on your back to sitting on the side of a flat bed without using bedrails?: None Help needed  moving to and from a bed to a chair (including a wheelchair)?: None Help needed standing up from a chair using your arms (e.g., wheelchair or bedside chair)?: None Help needed to walk in hospital room?: A Little Help needed climbing 3-5 steps with a railing? : A Little 6 Click Score: 22    End of Session Equipment Utilized During Treatment: Gait belt;Oxygen Activity Tolerance: Patient tolerated treatment well Patient left: in bed;with call bell/phone within reach Nurse Communication: Mobility status PT Visit Diagnosis: Unsteadiness on feet (R26.81);Difficulty in walking, not elsewhere classified (R26.2)     Time: 5784-6962 PT Time Calculation (min) (ACUTE ONLY): 23 min  Charges:    $Gait Training: 23-37 mins PT General Charges $$ ACUTE PT VISIT: 1 Visit                     Arlyss Gandy, PT, DPT Acute Rehabilitation Office [phone]    Arlyss Gandy 10/12/2023, 3:44 PM

## 2023-10-13 DIAGNOSIS — R0600 Dyspnea, unspecified: Secondary | ICD-10-CM | POA: Diagnosis not present

## 2023-10-13 LAB — CULTURE, BLOOD (ROUTINE X 2)
Culture: NO GROWTH
Culture: NO GROWTH

## 2023-10-13 LAB — LEGIONELLA PNEUMOPHILA SEROGP 1 UR AG: L. pneumophila Serogp 1 Ur Ag: NEGATIVE

## 2023-10-13 MED ORDER — AMOXICILLIN-POT CLAVULANATE 875-125 MG PO TABS: 1.0000 | ORAL_TABLET | Freq: Two times a day (BID) | ORAL | 0 refills | Status: AC

## 2023-10-13 MED ORDER — GUAIFENESIN ER 600 MG PO TB12: 1200.0000 mg | ORAL_TABLET | Freq: Two times a day (BID) | ORAL | 0 refills | Status: AC

## 2023-10-13 MED ORDER — IPRATROPIUM-ALBUTEROL 0.5-2.5 (3) MG/3ML IN SOLN: 3.0000 mL | Freq: Four times a day (QID) | RESPIRATORY_TRACT | 0 refills | Status: AC | PRN

## 2023-10-13 MED ORDER — ARFORMOTEROL TARTRATE 15 MCG/2ML IN NEBU: 15.0000 ug | INHALATION_SOLUTION | Freq: Two times a day (BID) | RESPIRATORY_TRACT | 0 refills | Status: AC

## 2023-10-13 MED ORDER — AMOXICILLIN-POT CLAVULANATE 875-125 MG PO TABS
1.0000 | ORAL_TABLET | Freq: Two times a day (BID) | ORAL | Status: DC
  Administered 2023-10-13 – 2023-10-14 (×3): 1 via ORAL
  Filled 2023-10-13 (×3): qty 1

## 2023-10-13 MED ORDER — AMLODIPINE BESYLATE 5 MG PO TABS: 5.0000 mg | ORAL_TABLET | Freq: Every day | ORAL | 1 refills | Status: AC

## 2023-10-13 MED ORDER — BUDESONIDE 0.25 MG/2ML IN SUSP: 0.2500 mg | Freq: Two times a day (BID) | RESPIRATORY_TRACT | 12 refills | Status: AC

## 2023-10-13 NOTE — TOC Transition Note (Signed)
 Transition of Care [facility]) - Discharge Note   Patient Details  Name: Marissa Caldwell MRN: [id number] Date of Birth: 06-21-1932  [facility] CM/SW Contact:  Ronny Bacon, RN Phone Number: 10/13/2023, 8:44 AM   Clinical Narrative:   Patient is being discharged today. Eber Jones with [facility] made aware.    Final next level of care: Home w Home Health Services Barriers to Discharge: No Barriers Identified   Patient Goals and CMS Choice            Discharge Placement                       Discharge Plan and Services Additional resources added to the After Visit Summary for                                       Social Drivers of Health (SDOH) Interventions SDOH Screenings   Food Insecurity: No Food Insecurity (10/08/2023)  Housing: Patient Declined (10/08/2023)  Transportation Needs: No Transportation Needs (10/08/2023)  Utilities: Patient Declined (10/08/2023)  Social Connections: Unknown (10/08/2023)  Tobacco Use: Medium Risk (10/08/2023)     Readmission Risk Interventions     No data to display

## 2023-10-13 NOTE — Discharge Summary (Signed)
 Physician Discharge Summary   Patient: Marissa Caldwell MRN: [id number] DOB: 1932/06/11  Admit date:     10/08/2023  Discharge date: 10/13/23  Discharge Physician: Alba Cory   PCP: Laurann Montana, MD   Recommendations at discharge:    Follow up resolution of PNA Follow up resolution of Delirium  Discharge Diagnoses: Principal Problem:   Community acquired pneumonia Active Problems:   Dyspnea  Resolved Problems:   * No resolved hospital problems. *  Hospital Course: 89 year old with past medical history significant for CAD status post PCI, legally blind, GERD, colon cancer status post right colectomy, breast cancer status postlumpectomy and radiotherapy, lung cancer s/p radiation, hypertension, hyperlipidemia, hypothyroidism, COPD, anxiety, chronic respiratory failure on home oxygen at night only presented with worsening shortness of breath, cough, found to have pneumonia and RSV positive. Started on empiric antibiotics.    Assessment and Plan: 1-Community-acquired pneumonia, RSV positive infection  Patient presented with cough, chest x-ray atelectasis versus mucous plugging in the setting of lung cancer.  RSV positive. -Treated with  ceftriaxone and doxycycline for 5 days. Complete 7 days with Augmentin  -Follow blood cultures: no growth to date, sputum culture: no growth to date.  -Continue Mucinex Improving, continue current management.    RSV infection: Continue with supportive care.   Acute Metabolic encephalopathy Hallucinations; Stop tramadol Will be in the setting of delirium, acute illness CO2 not significantly elevated Oxygen saturation 100% on 2 L Will need 24 hours care. SW cosnulted   Acute on chronic hypoxic respiratory failure, she uses 3 L of oxygen at bedtime Requiring oxygen at rest and on ambulation Home oxygen ordered.    COPD without exacerbation: Continue with Duo-Neb. Change dulera  to pulmicort and Brovana   Hypokalemia Replete     Hypothyroidism -Continue Synthroid   Hyperlipidemia -Continue pravastatin   History of breast cancer S/p right breast lumpectomy, adjuvant radiotherapy and is currently on Tamoxifen -Continue tamoxifen   History of lung cancer Right upper lobe mass. Patient follows with radiation oncology for radiation therapy.   History of right colon cancer History of right colectomy.   Chronic pain -Continue Tramadol and gabapentin   Anxiety -Continue Ativan as needed   Hypophosphatemia -phosphorus supplementation IV    GERD -Continue famotidine   Hyponatremia; monitor   HTN; start Low dose Norvasc.     Estimated body mass index is 19.05 kg/m as calculated from the following:   Height as of 04/17/23: 5\' 10"  (1.778 m).   Weight as of 04/17/23: 60.2 kg.         Consultants: none Procedures performed: none Disposition: Home Diet recommendation:  Discharge Diet Orders (From admission, onward)     Start     Ordered   10/13/23 0000  Diet - low sodium heart healthy        10/13/23 0838   10/13/23 0000  Diet - low sodium heart healthy        10/13/23 1401           Carb modified diet DISCHARGE MEDICATION: Allergies as of 10/13/2023       Reactions   Lipitor [atorvastatin]    Elevated LFT's        Medication List     STOP taking these medications    brimonidine 0.2 % ophthalmic solution Commonly known as: ALPHAGAN   budesonide-formoterol 160-4.5 MCG/ACT inhaler Commonly known as: SYMBICORT   Dulera 200-5 MCG/ACT Aero Generic drug: mometasone-formoterol   mirtazapine 15 MG tablet Commonly known as: REMERON  Toviaz 4 MG Tb24 tablet Generic drug: fesoterodine       TAKE these medications    albuterol 108 (90 Base) MCG/ACT inhaler Commonly known as: VENTOLIN HFA Inhale 2 puffs into the lungs every 6 (six) hours as needed for wheezing or shortness of breath.   amLODipine 5 MG tablet Commonly known as: NORVASC Take 1 tablet (5 mg total) by  mouth daily.   amoxicillin-clavulanate 875-125 MG tablet Commonly known as: AUGMENTIN Take 1 tablet by mouth 2 (two) times daily for 3 days.   arformoterol 15 MCG/2ML Nebu Commonly known as: BROVANA Take 2 mLs (15 mcg total) by nebulization 2 (two) times daily.   brimonidine-timolol 0.2-0.5 % ophthalmic solution Commonly known as: COMBIGAN Apply to eye.   budesonide 0.25 MG/2ML nebulizer solution Commonly known as: PULMICORT Take 2 mLs (0.25 mg total) by nebulization 2 (two) times daily.   Calcium + D3 600-200 MG-UNIT Tabs Take 1 tablet by mouth daily.   CoQ10 100 MG Caps Take 100 mg by mouth daily.   diclofenac 50 MG tablet Commonly known as: CATAFLAM Take 1 tablet (50 mg total) by mouth daily after breakfast.   famotidine 20 MG tablet Commonly known as: PEPCID Take 20 mg by mouth daily.   FISH OIL PO Take 1 capsule by mouth daily.   gabapentin 100 MG capsule Commonly known as: NEURONTIN Take 1 capsule (100 mg total) by mouth at bedtime.   guaiFENesin 600 MG 12 hr tablet Commonly known as: MUCINEX Take 2 tablets (1,200 mg total) by mouth 2 (two) times daily.   ipratropium-albuterol 0.5-2.5 (3) MG/3ML Soln Commonly known as: DUONEB Take 3 mLs by nebulization every 6 (six) hours as needed.   latanoprost 0.005 % ophthalmic solution Commonly known as: XALATAN Place 1 drop into both eyes at bedtime.   levothyroxine 100 MCG tablet Commonly known as: SYNTHROID Take 100 mcg by mouth daily before breakfast.   LORazepam 0.5 MG tablet Commonly known as: ATIVAN Take 0.25-0.5 mg by mouth every 8 (eight) hours as needed for anxiety.   LUTEIN PO Take 1 tablet by mouth daily.   multivitamin tablet Take 1 tablet by mouth daily.   nitroGLYCERIN 0.4 MG SL tablet Commonly known as: NITROSTAT Place 1 tablet (0.4 mg total) under the tongue every 5 (five) minutes as needed for chest pain.   pravastatin 40 MG tablet Commonly known as: PRAVACHOL TAKE 1 TABLET BY MOUTH  DAILY GENERIC EQUIVALENT FOR PRAVACHOL   solifenacin 5 MG tablet Commonly known as: VESICARE Take 5 mg by mouth daily.   tamoxifen 20 MG tablet Commonly known as: NOLVADEX TAKE 1 TABLET BY MOUTH ONCE DAILY . APPOINTMENT REQUIRED FOR FUTURE REFILLS   traMADol 50 MG tablet Commonly known as: ULTRAM Take 0.5-1 tablets (25-50 mg total) by mouth every 8 (eight) hours as needed.   Vitamin D3 50 MCG (2000 UT) capsule Take 4,000 Units by mouth daily.               Durable Medical Equipment  (From admission, onward)           Start     Ordered   10/13/23 0839  For home use only DME Nebulizer machine  Once       Question Answer Comment  Patient needs a nebulizer to treat with the following condition COPD (chronic obstructive pulmonary disease) (HCC)   Length of Need Lifetime   Additional equipment included Administration kit      10/13/23 [phone]   10/11/23 1537  For home use only DME Shower stool  Once        10/11/23 1536   10/10/23 1309  For home use only DME oxygen  Once       Question Answer Comment  Length of Need Lifetime   Mode or (Route) Nasal cannula   Liters per Minute 3   Frequency Continuous (stationary and portable oxygen unit needed)   Oxygen delivery system Gas      10/10/23 1308            Discharge Exam: There were no vitals filed for this visit. General; NAD  Condition at discharge: stable  The results of significant diagnostics from this hospitalization (including imaging, microbiology, ancillary and laboratory) are listed below for reference.   Imaging Studies: CT Angio Chest PE W and/or Wo Contrast Result Date: 10/08/2023 CLINICAL DATA:  Chest pain and shortness of breath. Cough. High probability for pulmonary embolism. Lung carcinoma. * Tracking Code: BO * EXAM: CT ANGIOGRAPHY CHEST WITH CONTRAST TECHNIQUE: Multidetector CT imaging of the chest was performed using the standard protocol during bolus administration of intravenous contrast.  Multiplanar CT image reconstructions and MIPs were obtained to evaluate the vascular anatomy. RADIATION DOSE REDUCTION: This exam was performed according to the departmental dose-optimization program which includes automated exposure control, adjustment of the mA and/or kV according to patient size and/or use of iterative reconstruction technique. CONTRAST:  75mL OMNIPAQUE IOHEXOL 350 MG/ML SOLN COMPARISON:  10/02/2023 FINDINGS: Cardiovascular: Satisfactory opacification of pulmonary arteries noted, and no pulmonary emboli identified. No evidence of thoracic aortic dissection or aneurysm. Mediastinum/Nodes: No masses or pathologically enlarged lymph nodes identified. Lungs/Pleura: Ill-defined masslike opacity is again seen in the anterior right upper lobe with adjacent mild bronchiectasis. This measures 3.0 x 2.8 cm on image 42/6, without significant change since previous study. A few sub-centimeter ill-defined nodular opacities in the left upper and lower lobes also unchanged. Increased dependent atelectasis is seen in both lung bases, with mucous plugging noted in several peripheral left lower lobe bronchi. Upper abdomen: No acute findings. Musculoskeletal: No suspicious bone lesions identified. Review of the MIP images confirms the above findings. IMPRESSION: No evidence of pulmonary embolism. Increased dependent atelectasis in both lung bases, with mucous plugging noted in several peripheral left lower lobe bronchi. Stable masslike opacity in anterior right upper lobe. Stable sub-centimeter nodular opacities in left upper and lower lobes. Electronically Signed   By: Danae Orleans M.D.   On: 10/08/2023 16:41   CT Chest Wo Contrast Result Date: 10/08/2023 CLINICAL DATA:  Follow-up right upper lobe non-small-cell lung cancer. History of breast and colon cancer. EXAM: CT CHEST WITHOUT CONTRAST TECHNIQUE: Multidetector CT imaging of the chest was performed following the standard protocol without IV contrast.  RADIATION DOSE REDUCTION: This exam was performed according to the departmental dose-optimization program which includes automated exposure control, adjustment of the mA and/or kV according to patient size and/or use of iterative reconstruction technique. COMPARISON:  04/10/2023 FINDINGS: Cardiovascular: Normal sized heart. Stable minimal pericardial effusion with a maximum thickness of 5 mm Atheromatous calcifications, including the coronary arteries and aorta. Mediastinum/Nodes: No enlarged mediastinal or axillary lymph nodes. Thyroid gland, trachea, and esophagus demonstrate no significant findings. Lungs/Pleura: The previously demonstrated 1.6 x 1.1 cm right upper lobe lung nodule currently measures 3.2 x 2.1 cm in corresponding dimensions on image number 33/8. This has a thicker soft tissue extension to the pleural surface with spread along the pleural surface extending 4.5 cm. Is also an interval 2nd mildly  thickened linear extension to the pleural surface posteriorly with extension along the pleural surface. No significant change in bilateral calcified granulomata. Previously demonstrated 0.9 cm ground-glass nodule in the left upper lobe measures 1.1 cm today on image number 62/8. A previously demonstrated 1.5 cm more inferiorly located left upper lobe ground-glass nodule currently measures 1.4 cm on image number 78/8. Additional patchy and consolidative densities previously demonstrated in the left lower lobe have resolved No new nodules.  No pleural fluid. Upper Abdomen: Atheromatous arterial calcifications. No evidence of upper abdominal metastatic disease. Musculoskeletal: Moderate thoracolumbar scoliosis and degenerative changes no evidence of bony metastatic disease IMPRESSION: 1. Interval increase in size of the right upper lobe lung nodule with thicker soft tissue extensions to the pleural surface and spread along the pleural surface. This is compatible with interval growth of known malignancy. 2.  Interval increase in size of 2 ground-glass nodules in the left upper lobe, most likely representing a mildly progressive atypical infectious process. 3. Interval resolution of additional patchy and consolidative densities previously demonstrated in the left lower lobe. 4. No evidence of metastatic disease. 5. Stable minimal pericardial effusion. 6. Calcific coronary artery and aortic atherosclerosis. Aortic Atherosclerosis (ICD10-I70.0). Electronically Signed   By: Beckie Salts M.D.   On: 10/08/2023 09:20   DG Chest Port 1 View Result Date: 10/08/2023 CLINICAL DATA:  Cough and shortness of breath. Right upper lobe non-small-cell lung cancer. Breast cancer. EXAM: PORTABLE CHEST 1 VIEW COMPARISON:  03/28/2023.  Chest CT dated 10/02/2023 FINDINGS: Right upper lobe mass, posttreatment changes without significant change since 10/02/2023 the lungs remain hyperexpanded. Minimal patchy densities in the mid left lung without significant change since 10/02/2023. No pleural fluid stable mildly enlarged cardiac silhouette and tortuous and partially calcified thoracic aorta. Stable mild-to-moderate thoracolumbar scoliosis and diffuse osteopenia. IMPRESSION: 1. Stable minimal patchy density in the left mid lung zone, possibly representing minimal pneumonia or focal inflammatory changes. 2. Stable hyperexpansion of the lungs compatible with COPD. 3. Stable right upper lobe mass, posttreatment changes. 4. Stable mild cardiomegaly. Electronically Signed   By: Beckie Salts M.D.   On: 10/08/2023 09:05   Intravitreal Injection, Pharmacologic Agent - OS - Left Eye Result Date: 10/01/2023 Time Out 10/01/2023. 1:28 PM. Confirmed correct patient, procedure, site, and patient consented. Anesthesia Topical anesthesia was used. Anesthetic medications included Lidocaine 2%, Proparacaine 0.5%. Procedure Preparation included 5% betadine to ocular surface, eyelid speculum. A supplied needle was used. Injection: 1.25 mg Bevacizumab  1.25mg /0.39ml   Route: Intravitreal, Site: Left Eye   NDC: [id number], Lot: [id number], Expiration date: 11/06/2023 Post-op Post injection exam found visual acuity of at least counting fingers. The patient tolerated the procedure well. There were no complications. The patient received written and verbal post procedure care education. Post injection medications were not given.   OCT, Retina - OU - Both Eyes Result Date: 10/01/2023 Right Eye Quality was good. Central Foveal Thickness: 321. Progression has been stable. Findings include no IRF, no SRF, abnormal foveal contour, retinal drusen , subretinal hyper-reflective material, disciform scar, epiretinal membrane, pigment epithelial detachment, outer retinal atrophy (persistent thick SRHM/disciform scar -- stable from prior). Left Eye Quality was good. Central Foveal Thickness: 288. Progression has improved. Findings include no IRF, no SRF, abnormal foveal contour, retinal drusen , subretinal hyper-reflective material, pigment epithelial detachment, outer retinal atrophy (Mild interval improvement in PED/CNV and SRHM / edema overlying, Significant central GA / ORA ). 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 CME - cystoid macular edema. PED - pigment epithelial detachment. IRF - intraretinal fluid. SRF - subretinal fluid. EZ - ellipsoid zone. ERM - epiretinal membrane. ORA - outer retinal atrophy. ORT - outer retinal tubulation. SRHM - subretinal hyper-reflective material   Microbiology: Results for orders placed or performed during the hospital encounter of 10/08/23  Resp panel by RT-PCR (RSV, Flu A&B, Covid) Anterior Nasal Swab     Status: None    Collection Time: 10/08/23  8:52 AM   Specimen: Anterior Nasal Swab  Result Value Ref Range Status   SARS Coronavirus 2 by RT PCR NEGATIVE NEGATIVE Final   Influenza A by PCR NEGATIVE NEGATIVE Final   Influenza B by PCR NEGATIVE NEGATIVE Final    Comment: (NOTE) The Xpert Xpress SARS-CoV-2/FLU/RSV plus assay is intended as an aid in the diagnosis of influenza from Nasopharyngeal swab specimens and should not be used as a sole basis for treatment. Nasal washings and aspirates are unacceptable for Xpert Xpress SARS-CoV-2/FLU/RSV testing.  Fact Sheet for Patients: BloggerCourse.com  Fact Sheet for Healthcare Providers: SeriousBroker.it  This test is not yet approved or cleared by the [place] FDA and has been authorized for detection and/or diagnosis of SARS-CoV-2 by FDA under an Emergency Use Authorization (EUA). This EUA will remain in effect (meaning this test can be used) for the duration of the COVID-19 declaration under Section 564(b)(1) of the Act, 21 U.S.C. section 360bbb-3(b)(1), unless the authorization is terminated or revoked.     Resp Syncytial Virus by PCR NEGATIVE NEGATIVE Final    Comment: (NOTE) Fact Sheet for Patients: BloggerCourse.com  Fact Sheet for Healthcare Providers: SeriousBroker.it  This test is not yet approved or cleared by the [place] FDA and has been authorized for detection and/or diagnosis of SARS-CoV-2 by FDA under an Emergency Use Authorization (EUA). This EUA will remain in effect (meaning this test can be used) for the duration of the COVID-19 declaration under Section 564(b)(1) of the Act, 21 U.S.C. section 360bbb-3(b)(1), unless the authorization is terminated or revoked.  Performed at [facility], [address] [place], [place] [address]   Culture, blood (routine x 2)     Status: None   Collection Time: 10/08/23  8:52  AM   Specimen: BLOOD LEFT ARM  Result Value Ref Range Status   Specimen Description BLOOD LEFT ARM  Final   Special Requests   Final    BOTTLES DRAWN AEROBIC ONLY Blood Culture results may not be optimal due to an inadequate volume of blood received in culture bottles   Culture   Final    NO GROWTH 5 DAYS Performed at [facility], [address] [place], [place] [address]    Report Status 10/13/2023 FINAL  Final  Culture, blood (routine x 2)     Status: None   Collection Time: 10/08/23  9:09 AM   Specimen: BLOOD LEFT HAND  Result Value Ref Range Status   Specimen Description BLOOD LEFT HAND  Final   Special Requests BOTTLES DRAWN AEROBIC ONLY  Final   Culture   Final    NO GROWTH 5 DAYS Performed at [facility], [address], [place], [place] [address]    Report Status 10/13/2023 FINAL  Final  Respiratory (~20 pathogens)  panel by PCR     Status: Abnormal   Collection Time: 10/08/23  8:04 PM   Specimen: Nasopharyngeal Swab; Respiratory  Result Value Ref Range Status   Adenovirus NOT DETECTED NOT DETECTED Final   Coronavirus 229E NOT DETECTED NOT DETECTED Final    Comment: (NOTE) The Coronavirus on the Respiratory Panel, DOES NOT test for the novel  Coronavirus (2019 nCoV)    Coronavirus HKU1 NOT DETECTED NOT DETECTED Final   Coronavirus NL63 NOT DETECTED NOT DETECTED Final   Coronavirus OC43 NOT DETECTED NOT DETECTED Final   Metapneumovirus NOT DETECTED NOT DETECTED Final   Rhinovirus / Enterovirus NOT DETECTED NOT DETECTED Final   Influenza A NOT DETECTED NOT DETECTED Final   Influenza B NOT DETECTED NOT DETECTED Final   Parainfluenza Virus 1 NOT DETECTED NOT DETECTED Final   Parainfluenza Virus 2 NOT DETECTED NOT DETECTED Final   Parainfluenza Virus 3 NOT DETECTED NOT DETECTED Final   Parainfluenza Virus 4 NOT DETECTED NOT DETECTED Final   Respiratory Syncytial Virus DETECTED (A) NOT DETECTED Final   Bordetella pertussis NOT DETECTED NOT DETECTED Final    Bordetella Parapertussis NOT DETECTED NOT DETECTED Final   Chlamydophila pneumoniae NOT DETECTED NOT DETECTED Final   Mycoplasma pneumoniae NOT DETECTED NOT DETECTED Final    Comment: Performed at [facility], [address] [place], [place] [address]  MRSA Next Gen by PCR, Nasal     Status: None   Collection Time: 10/08/23  8:04 PM   Specimen: Nasal Mucosa; Nasal Swab  Result Value Ref Range Status   MRSA by PCR Next Gen NOT DETECTED NOT DETECTED Final    Comment: (NOTE) The GeneXpert MRSA Assay (FDA approved for NASAL specimens only), is one component of a comprehensive MRSA colonization surveillance program. It is not intended to diagnose MRSA infection nor to guide or monitor treatment for MRSA infections. Test performance is not FDA approved in patients less than 26 years old. Performed at [facility], [address], [place], [place] [address]   Expectorated Sputum Assessment w Gram Stain, Rflx to Resp Cult     Status: None   Collection Time: 10/09/23  6:00 AM   Specimen: Expectorated Sputum  Result Value Ref Range Status   Specimen Description EXPECTORATED SPUTUM  Final   Special Requests NONE  Final   Sputum evaluation   Final    THIS SPECIMEN IS ACCEPTABLE FOR SPUTUM CULTURE Performed at [facility], [address] [place], [place] [address]    Report Status 10/09/2023 FINAL  Final  Culture, Respiratory w Gram Stain     Status: None   Collection Time: 10/09/23  6:00 AM  Result Value Ref Range Status   Specimen Description EXPECTORATED SPUTUM  Final   Special Requests NONE Reflexed from [id number]  Final   Gram Stain   Final    RARE SQUAMOUS EPITHELIAL CELLS PRESENT FEW WBC PRESENT,BOTH PMN AND MONONUCLEAR RARE GRAM VARIABLE ROD    Culture   Final    RARE Normal respiratory flora-no Staph aureus or Pseudomonas seen Performed at [facility], [address], [place], [place] [address]    Report Status 10/11/2023 FINAL  Final     Labs: CBC: Recent Labs  Lab 10/08/23 0852 10/09/23 0658 10/11/23 0926  WBC 13.3* 10.0 7.5  NEUTROABS 11.6*  --   --   HGB 12.6 11.5* 10.6*  HCT 37.1 33.7* 30.9*  MCV 94.6 92.8 93.4  PLT 231 183 179  Basic Metabolic Panel: Recent Labs  Lab 10/08/23 1016 10/09/23 0658 10/10/23 0555 10/11/23 0926 10/11/23 2152 10/12/23 0926  NA 134* 133* 130* 132*  --  132*  K 3.3* 3.8 3.3* 3.4*  --  4.5  CL 100 100 98 100  --  103  CO2 21* 21* 23 21*  --  21*  GLUCOSE 123* 109* 98 171*  --  123*  BUN 20 22 20 15   --  10  CREATININE 0.53 0.68 1.00 0.62  --  0.58  CALCIUM 8.4* 8.2* 8.0* 7.9*  --  8.1*  MG  --  2.1  --  2.2  --   --   PHOS  --  2.1* 2.2* 1.5* 3.0  --    Liver Function Tests: Recent Labs  Lab 10/08/23 1016  AST 24  ALT 14  ALKPHOS 43  BILITOT 0.8  PROT 6.6  ALBUMIN 3.2*   CBG: No results for input(s): "GLUCAP" in the last 168 hours.  Discharge time spent: greater than 30 minutes.  Signed: Alba Cory, MD [facility] 10/13/2023

## 2023-10-13 NOTE — TOC Progression Note (Signed)
 Transition of Care [facility]) - Progression Note    Patient Details  Name: Marissa Caldwell MRN: [id number] Date of Birth: 19-Dec-1931  Transition [facility] CM/SW Contact  Donnalee Curry, LCSWA Phone Number: 10/13/2023, 11:21 AM  Clinical Narrative:      SW informed by bedside RN and MD, concerns for pt returning home due to son not available 24/7. SW informed RN/MD, can pursue SNF if son is agreeable but pt may not get auth approved based on current PT notes.   SW attempted to call son Marissa Caldwell [phone]) but straight to VM and VM full, will continue efforts.   Update 135pm SW spoke with pt's son Marissa Caldwell, who reports he does not believe pt will agree to SNF, but if he is able to arrange private pay help he is willing to take pt home today. SW emailed list of PCS in the area to [employer] ([email or web address]) for review.     Barriers to Discharge: No Barriers Identified  Expected Discharge Plan and Services         Expected Discharge Date: 10/13/23                                     Social Determinants of Health (SDOH) Interventions SDOH Screenings   Food Insecurity: No Food Insecurity (10/08/2023)  Housing: Patient Declined (10/08/2023)  Transportation Needs: No Transportation Needs (10/08/2023)  Utilities: Patient Declined (10/08/2023)  Social Connections: Unknown (10/08/2023)  Tobacco Use: Medium Risk (10/08/2023)    Readmission Risk Interventions     No data to display

## 2023-10-13 NOTE — Progress Notes (Signed)
 PROGRESS NOTE    Marissa Caldwell  ZOX:[id number] DOB: [birth year] DOA: 10/08/2023 PCP: Laurann Montana, MD   Brief Narrative: 89 year old with past medical history significant for CAD status post PCI, legally blind, GERD, colon cancer status post right colectomy, breast cancer status postlumpectomy and radiotherapy, lung cancer s/p radiation, hypertension, hyperlipidemia, hypothyroidism, COPD, anxiety, chronic respiratory failure on home oxygen at night only presented with worsening shortness of breath, cough, found to have pneumonia and RSV positive.  Started on empiric antibiotics.  Assessment & Plan:   Principal Problem:   Community acquired pneumonia Active Problems:   Dyspnea   1-Community-acquired pneumonia, RSV positive infection  Patient presented with cough, chest x-ray atelectasis versus mucous plugging in the setting of lung cancer.  RSV positive. -Treated with  ceftriaxone and doxycycline for 5 days. Complete 7 days with Augmentin  -Follow blood cultures: no growth to date, sputum culture: no growth to date.  -Continue Mucinex Improving, continue current management.   RSV infection: Continue with supportive care.  Acute Metabolic encephalopathy Hallucinations; Stop tramadol Will be in the setting of delirium, acute illness CO2 not significantly elevated Oxygen saturation 100% on 2 L Will need 24 hours care. SW cosnulted  Acute on chronic hypoxic respiratory failure, she uses 3 L of oxygen at bedtime Requiring oxygen at rest and on ambulation Home oxygen ordered.   COPD without exacerbation: Continue with Duo-Neb. Change dulera  to pulmicort and Brovana  Hypokalemia Replete   Hypothyroidism -Continue Synthroid   Hyperlipidemia -Continue pravastatin   History of breast cancer S/p right breast lumpectomy, adjuvant radiotherapy and is currently on Tamoxifen -Continue tamoxifen   History of lung cancer Right upper lobe mass. Patient follows with radiation  oncology for radiation therapy.   History of right colon cancer History of right colectomy.   Chronic pain -Continue Tramadol and gabapentin   Anxiety -Continue Ativan as needed   Hypophosphatemia -phosphorus supplementation IV    GERD -Continue famotidine   Hyponatremia; monitor  HTN; start Low dose Norvasc.   Estimated body mass index is 19.05 kg/m as calculated from the following:   Height as of 04/17/23: 5\' 10"  (1.778 m).   Weight as of 04/17/23: 60.2 kg.   DVT prophylaxis: Lovenox Code Status: DNR Family Communication: Son updated 2/21 Disposition Plan:  Status is: Observation The patient remains OBS appropriate and will d/c before 2 midnights.    Consultants:  none  Procedures:  none  Antimicrobials:    Subjective: Continue to be confuse. Having hallucination  Objective: Vitals:   10/12/23 2033 10/13/23 0404 10/13/23 0736 10/13/23 1023  BP: (!) 137/52 (!) 156/78 (!) 162/81   Pulse: 89 84 82   Resp: 17 18 17    Temp: 98.5 F (36.9 C) 98.6 F (37 C) 98.5 F (36.9 C)   TempSrc: Oral Oral Oral   SpO2: 98% 96% 95% 94%    Intake/Output Summary (Last 24 hours) at 10/13/2023 1057 Last data filed at 10/13/2023 0700 Gross per 24 hour  Intake 560 ml  Output --  Net 560 ml   There were no vitals filed for this visit.  Examination:  General exam: NAD Respiratory system; BL [place]. Cardiovascular system: S 1, S 2 RRR Gastrointestinal system: BS present,soft, nt Central nervous system: Alert, confuse  Data Reviewed: I have personally reviewed following labs and imaging studies  CBC: Recent Labs  Lab 10/08/23 0852 10/09/23 0658 10/11/23 0926  WBC 13.3* 10.0 7.5  NEUTROABS 11.6*  --   --   HGB 12.6  11.5* 10.6*  HCT 37.1 33.7* 30.9*  MCV 94.6 92.8 93.4  PLT 231 183 179   Basic Metabolic Panel: Recent Labs  Lab 10/08/23 1016 10/09/23 0658 10/10/23 0555 10/11/23 0926 10/11/23 2152 10/12/23 0926  NA 134* 133* 130* 132*  --  132*   K 3.3* 3.8 3.3* 3.4*  --  4.5  CL 100 100 98 100  --  103  CO2 21* 21* 23 21*  --  21*  GLUCOSE 123* 109* 98 171*  --  123*  BUN 20 22 20 15   --  10  CREATININE 0.53 0.68 1.00 0.62  --  0.58  CALCIUM 8.4* 8.2* 8.0* 7.9*  --  8.1*  MG  --  2.1  --  2.2  --   --   PHOS  --  2.1* 2.2* 1.5* 3.0  --    GFR: CrCl cannot be calculated (Unknown ideal weight.). Liver Function Tests: Recent Labs  Lab 10/08/23 1016  AST 24  ALT 14  ALKPHOS 43  BILITOT 0.8  PROT 6.6  ALBUMIN 3.2*   No results for input(s): "LIPASE", "AMYLASE" in the last 168 hours. Recent Labs  Lab 10/12/23 1820  AMMONIA 25   Coagulation Profile: No results for input(s): "INR", "PROTIME" in the last 168 hours. Cardiac Enzymes: No results for input(s): "CKTOTAL", "CKMB", "CKMBINDEX", "TROPONINI" in the last 168 hours. BNP (last 3 results) No results for input(s): "PROBNP" in the last 8760 hours. HbA1C: No results for input(s): "HGBA1C" in the last 72 hours. CBG: No results for input(s): "GLUCAP" in the last 168 hours. Lipid Profile: No results for input(s): "CHOL", "HDL", "LDLCALC", "TRIG", "CHOLHDL", "LDLDIRECT" in the last 72 hours. Thyroid Function Tests: No results for input(s): "TSH", "T4TOTAL", "FREET4", "T3FREE", "THYROIDAB" in the last 72 hours. Anemia Panel: No results for input(s): "VITAMINB12", "FOLATE", "FERRITIN", "TIBC", "IRON", "RETICCTPCT" in the last 72 hours. Sepsis Labs: Recent Labs  Lab 10/08/23 0859 10/08/23 1024 10/09/23 0658  PROCALCITON  --   --  0.16  LATICACIDVEN 1.6 1.0  --     Recent Results (from the past 240 hours)  Resp panel by RT-PCR (RSV, Flu A&B, Covid) Anterior Nasal Swab     Status: None   Collection Time: 10/08/23  8:52 AM   Specimen: Anterior Nasal Swab  Result Value Ref Range Status   SARS Coronavirus 2 by RT PCR NEGATIVE NEGATIVE Final   Influenza A by PCR NEGATIVE NEGATIVE Final   Influenza B by PCR NEGATIVE NEGATIVE Final    Comment: (NOTE) The Xpert  Xpress SARS-CoV-2/FLU/RSV plus assay is intended as an aid in the diagnosis of influenza from Nasopharyngeal swab specimens and should not be used as a sole basis for treatment. Nasal washings and aspirates are unacceptable for Xpert Xpress SARS-CoV-2/FLU/RSV testing.  Fact Sheet for Patients: BloggerCourse.com  Fact Sheet for Healthcare Providers: SeriousBroker.it  This test is not yet approved or cleared by the [place] FDA and has been authorized for detection and/or diagnosis of SARS-CoV-2 by FDA under an Emergency Use Authorization (EUA). This EUA will remain in effect (meaning this test can be used) for the duration of the COVID-19 declaration under Section 564(b)(1) of the Act, 21 U.S.C. section 360bbb-3(b)(1), unless the authorization is terminated or revoked.     Resp Syncytial Virus by PCR NEGATIVE NEGATIVE Final    Comment: (NOTE) Fact Sheet for Patients: BloggerCourse.com  Fact Sheet for Healthcare Providers: SeriousBroker.it  This test is not yet approved or cleared by the [place] FDA  and has been authorized for detection and/or diagnosis of SARS-CoV-2 by FDA under an Emergency Use Authorization (EUA). This EUA will remain in effect (meaning this test can be used) for the duration of the COVID-19 declaration under Section 564(b)(1) of the Act, 21 U.S.C. section 360bbb-3(b)(1), unless the authorization is terminated or revoked.  Performed at [facility], [address], [place], [place] [address]   Culture, blood (routine x 2)     Status: None   Collection Time: 10/08/23  8:52 AM   Specimen: BLOOD LEFT ARM  Result Value Ref Range Status   Specimen Description BLOOD LEFT ARM  Final   Special Requests   Final    BOTTLES DRAWN AEROBIC ONLY Blood Culture results may not be optimal due to an inadequate volume of blood received in culture bottles    Culture   Final    NO GROWTH 5 DAYS Performed at [facility], [address] [place], [place] [address]    Report Status 10/13/2023 FINAL  Final  Culture, blood (routine x 2)     Status: None   Collection Time: 10/08/23  9:09 AM   Specimen: BLOOD LEFT HAND  Result Value Ref Range Status   Specimen Description BLOOD LEFT HAND  Final   Special Requests BOTTLES DRAWN AEROBIC ONLY  Final   Culture   Final    NO GROWTH 5 DAYS Performed at [facility], [address], [place], [place] [address]    Report Status 10/13/2023 FINAL  Final  Respiratory (~20 pathogens) panel by PCR     Status: Abnormal   Collection Time: 10/08/23  8:04 PM   Specimen: Nasopharyngeal Swab; Respiratory  Result Value Ref Range Status   Adenovirus NOT DETECTED NOT DETECTED Final   Coronavirus 229E NOT DETECTED NOT DETECTED Final    Comment: (NOTE) The Coronavirus on the Respiratory Panel, DOES NOT test for the novel  Coronavirus (2019 nCoV)    Coronavirus HKU1 NOT DETECTED NOT DETECTED Final   Coronavirus NL63 NOT DETECTED NOT DETECTED Final   Coronavirus OC43 NOT DETECTED NOT DETECTED Final   Metapneumovirus NOT DETECTED NOT DETECTED Final   Rhinovirus / Enterovirus NOT DETECTED NOT DETECTED Final   Influenza A NOT DETECTED NOT DETECTED Final   Influenza B NOT DETECTED NOT DETECTED Final   Parainfluenza Virus 1 NOT DETECTED NOT DETECTED Final   Parainfluenza Virus 2 NOT DETECTED NOT DETECTED Final   Parainfluenza Virus 3 NOT DETECTED NOT DETECTED Final   Parainfluenza Virus 4 NOT DETECTED NOT DETECTED Final   Respiratory Syncytial Virus DETECTED (A) NOT DETECTED Final   Bordetella pertussis NOT DETECTED NOT DETECTED Final   Bordetella Parapertussis NOT DETECTED NOT DETECTED Final   Chlamydophila pneumoniae NOT DETECTED NOT DETECTED Final   Mycoplasma pneumoniae NOT DETECTED NOT DETECTED Final    Comment: Performed at [facility], [address], [place], [place] [address]  MRSA Next  Gen by PCR, Nasal     Status: None   Collection Time: 10/08/23  8:04 PM   Specimen: Nasal Mucosa; Nasal Swab  Result Value Ref Range Status   MRSA by PCR Next Gen NOT DETECTED NOT DETECTED Final    Comment: (NOTE) The GeneXpert MRSA Assay (FDA approved for NASAL specimens only), is one component of a comprehensive MRSA colonization surveillance program. It is not intended to diagnose MRSA infection nor to guide or monitor treatment for MRSA infections. Test performance is not FDA approved in patients less  than 68 years old. Performed at [facility], [address] [place], [place] [address]   Expectorated Sputum Assessment w Gram Stain, Rflx to Resp Cult     Status: None   Collection Time: 10/09/23  6:00 AM   Specimen: Expectorated Sputum  Result Value Ref Range Status   Specimen Description EXPECTORATED SPUTUM  Final   Special Requests NONE  Final   Sputum evaluation   Final    THIS SPECIMEN IS ACCEPTABLE FOR SPUTUM CULTURE Performed at [facility], [address], [place], [place] [address]    Report Status 10/09/2023 FINAL  Final  Culture, Respiratory w Gram Stain     Status: None   Collection Time: 10/09/23  6:00 AM  Result Value Ref Range Status   Specimen Description EXPECTORATED SPUTUM  Final   Special Requests NONE Reflexed from [id number]  Final   Gram Stain   Final    RARE SQUAMOUS EPITHELIAL CELLS PRESENT FEW WBC PRESENT,BOTH PMN AND MONONUCLEAR RARE GRAM VARIABLE ROD    Culture   Final    RARE Normal respiratory flora-no Staph aureus or Pseudomonas seen Performed at [facility], [address], [place], [place] [address]    Report Status 10/11/2023 FINAL  Final         Radiology Studies: No results found.       Scheduled Meds:  amLODipine  5 mg Oral Daily   amoxicillin-clavulanate  1 tablet Oral Q12H   arformoterol  15 mcg Nebulization BID   budesonide (PULMICORT) nebulizer solution  0.25 mg Nebulization BID   enoxaparin  (LOVENOX) injection  30 mg Subcutaneous Q24H   famotidine  20 mg Oral Daily   fesoterodine  4 mg Oral Daily   gabapentin  100 mg Oral QHS   guaiFENesin  1,200 mg Oral BID   ibuprofen  400 mg Oral Q breakfast   latanoprost  1 drop Both Eyes QHS   levothyroxine  100 mcg Oral QAC breakfast   multivitamin with minerals  1 tablet Oral Daily   pravastatin  40 mg Oral Daily   sodium chloride flush  10-40 mL Intracatheter Q12H   tamoxifen  20 mg Oral Daily   Continuous Infusions:  cefTRIAXone (ROCEPHIN)  IV Stopped (10/11/23 2109)     LOS: 3 days    Time spent: 35 minutes    Jp Eastham A Zaim Nitta, MD [facility]   If 7PM-7AM, please contact night-coverage [email or web address]  10/13/2023, 10:57 AM

## 2023-10-13 NOTE — Plan of Care (Signed)
   Problem: Education: Goal: Knowledge of General Education information will improve Description: Including pain rating scale, medication(s)/side effects and non-pharmacologic comfort measures Outcome: Progressing   Problem: Health Behavior/Discharge Planning: Goal: Ability to manage health-related needs will improve Outcome: Progressing   Problem: Nutrition: Goal: Adequate nutrition will be maintained Outcome: Progressing

## 2023-10-14 DIAGNOSIS — R0600 Dyspnea, unspecified: Secondary | ICD-10-CM | POA: Diagnosis not present

## 2023-10-14 NOTE — Discharge Summary (Signed)
 Physician Discharge Summary   Patient: Marissa Caldwell MRN: [id number] DOB: [birth year]  Admit date:     10/08/2023  Discharge date: 10/14/23  Discharge Physician: Alba Cory   PCP: Laurann Montana, MD   Recommendations at discharge:    Follow up resolution of PNA Follow up resolution of Delirium  Discharge Diagnoses: Principal Problem:   Community acquired pneumonia Active Problems:   Dyspnea  Resolved Problems:   * No resolved hospital problems. *  Hospital Course: 89 year old with past medical history significant for CAD status post PCI, legally blind, GERD, colon cancer status post right colectomy, breast cancer status postlumpectomy and radiotherapy, lung cancer s/p radiation, hypertension, hyperlipidemia, hypothyroidism, COPD, anxiety, chronic respiratory failure on home oxygen at night only presented with worsening shortness of breath, cough, found to have pneumonia and RSV positive. Started on empiric antibiotics.   Stable for discharge.    Assessment and Plan: 1-Community-acquired pneumonia, RSV positive infection  Patient presented with cough, chest x-ray atelectasis versus mucous plugging in the setting of lung cancer.  RSV positive. -Treated with  ceftriaxone and doxycycline for 5 days. Complete 7 days with Augmentin  -Follow blood cultures: no growth to date, sputum culture: no growth to date.  -Continue Mucinex Improving, continue current management.    RSV infection: Continue with supportive care.   Acute Metabolic encephalopathy Hallucinations; Stop tramadol Will be in the setting of delirium, acute illness CO2 not significantly elevated Oxygen saturation 100% on 2 L Will need 24 hours care. SW cosnulted Son will arrange 24 hours care.   Acute on chronic hypoxic respiratory failure, she uses 3 L of oxygen at bedtime Requiring oxygen at rest and on ambulation Home oxygen ordered.    COPD without exacerbation: Continue with Duo-Neb. Change dulera   to pulmicort and Brovana   Hypokalemia Replete    Hypothyroidism -Continue Synthroid   Hyperlipidemia -Continue pravastatin   History of breast cancer S/p right breast lumpectomy, adjuvant radiotherapy and is currently on Tamoxifen -Continue tamoxifen   History of lung cancer Right upper lobe mass. Patient follows with radiation oncology for radiation therapy.   History of right colon cancer History of right colectomy.   Chronic pain -Continue Tramadol and gabapentin   Anxiety -Continue Ativan as needed   Hypophosphatemia -phosphorus supplementation IV    GERD -Continue famotidine   Hyponatremia; monitor   HTN; start Low dose Norvasc.     Estimated body mass index is 19.05 kg/m as calculated from the following:   Height as of 04/17/23: 5\' 10"  (1.778 m).   Weight as of 04/17/23: 60.2 kg.         Consultants: none Procedures performed: none Disposition: Home Diet recommendation:  Discharge Diet Orders (From admission, onward)     Start     Ordered   10/13/23 0000  Diet - low sodium heart healthy        10/13/23 0838   10/13/23 0000  Diet - low sodium heart healthy        10/13/23 1401           Carb modified diet DISCHARGE MEDICATION: Allergies as of 10/14/2023       Reactions   Lipitor [atorvastatin]    Elevated LFT's        Medication List     STOP taking these medications    brimonidine 0.2 % ophthalmic solution Commonly known as: ALPHAGAN   budesonide-formoterol 160-4.5 MCG/ACT inhaler Commonly known as: SYMBICORT   Dulera 200-5 MCG/ACT Aero Generic drug:  mometasone-formoterol   mirtazapine 15 MG tablet Commonly known as: REMERON   Toviaz 4 MG Tb24 tablet Generic drug: fesoterodine       TAKE these medications    albuterol 108 (90 Base) MCG/ACT inhaler Commonly known as: VENTOLIN HFA Inhale 2 puffs into the lungs every 6 (six) hours as needed for wheezing or shortness of breath.   amLODipine 5 MG tablet Commonly  known as: NORVASC Take 1 tablet (5 mg total) by mouth daily.   amoxicillin-clavulanate 875-125 MG tablet Commonly known as: AUGMENTIN Take 1 tablet by mouth 2 (two) times daily for 3 days.   arformoterol 15 MCG/2ML Nebu Commonly known as: BROVANA Take 2 mLs (15 mcg total) by nebulization 2 (two) times daily.   brimonidine-timolol 0.2-0.5 % ophthalmic solution Commonly known as: COMBIGAN Apply to eye.   budesonide 0.25 MG/2ML nebulizer solution Commonly known as: PULMICORT Take 2 mLs (0.25 mg total) by nebulization 2 (two) times daily.   Calcium + D3 600-200 MG-UNIT Tabs Take 1 tablet by mouth daily.   CoQ10 100 MG Caps Take 100 mg by mouth daily.   diclofenac 50 MG tablet Commonly known as: CATAFLAM Take 1 tablet (50 mg total) by mouth daily after breakfast.   famotidine 20 MG tablet Commonly known as: PEPCID Take 20 mg by mouth daily.   FISH OIL PO Take 1 capsule by mouth daily.   gabapentin 100 MG capsule Commonly known as: NEURONTIN Take 1 capsule (100 mg total) by mouth at bedtime.   guaiFENesin 600 MG 12 hr tablet Commonly known as: MUCINEX Take 2 tablets (1,200 mg total) by mouth 2 (two) times daily.   ipratropium-albuterol 0.5-2.5 (3) MG/3ML Soln Commonly known as: DUONEB Take 3 mLs by nebulization every 6 (six) hours as needed.   latanoprost 0.005 % ophthalmic solution Commonly known as: XALATAN Place 1 drop into both eyes at bedtime.   levothyroxine 100 MCG tablet Commonly known as: SYNTHROID Take 100 mcg by mouth daily before breakfast.   LORazepam 0.5 MG tablet Commonly known as: ATIVAN Take 0.25-0.5 mg by mouth every 8 (eight) hours as needed for anxiety.   LUTEIN PO Take 1 tablet by mouth daily.   multivitamin tablet Take 1 tablet by mouth daily.   nitroGLYCERIN 0.4 MG SL tablet Commonly known as: NITROSTAT Place 1 tablet (0.4 mg total) under the tongue every 5 (five) minutes as needed for chest pain.   pravastatin 40 MG  tablet Commonly known as: PRAVACHOL TAKE 1 TABLET BY MOUTH DAILY GENERIC EQUIVALENT FOR PRAVACHOL   solifenacin 5 MG tablet Commonly known as: VESICARE Take 5 mg by mouth daily.   tamoxifen 20 MG tablet Commonly known as: NOLVADEX TAKE 1 TABLET BY MOUTH ONCE DAILY . APPOINTMENT REQUIRED FOR FUTURE REFILLS   traMADol 50 MG tablet Commonly known as: ULTRAM Take 0.5-1 tablets (25-50 mg total) by mouth every 8 (eight) hours as needed.   Vitamin D3 50 MCG (2000 UT) capsule Take 4,000 Units by mouth daily.               Durable Medical Equipment  (From admission, onward)           Start     Ordered   10/13/23 0839  For home use only DME Nebulizer machine  Once       Question Answer Comment  Patient needs a nebulizer to treat with the following condition COPD (chronic obstructive pulmonary disease) (HCC)   Length of Need Lifetime   Additional equipment included Administration  kit      10/13/23 6213   10/11/23 1537  For home use only DME Shower stool  Once        10/11/23 1536   10/10/23 1309  For home use only DME oxygen  Once       Question Answer Comment  Length of Need Lifetime   Mode or (Route) Nasal cannula   Liters per Minute 3   Frequency Continuous (stationary and portable oxygen unit needed)   Oxygen delivery system Gas      10/10/23 1308            Discharge Exam: There were no vitals filed for this visit. General; NAD  Condition at discharge: stable  The results of significant diagnostics from this hospitalization (including imaging, microbiology, ancillary and laboratory) are listed below for reference.   Imaging Studies: CT Angio Chest PE W and/or Wo Contrast Result Date: 10/08/2023 CLINICAL DATA:  Chest pain and shortness of breath. Cough. High probability for pulmonary embolism. Lung carcinoma. * Tracking Code: BO * EXAM: CT ANGIOGRAPHY CHEST WITH CONTRAST TECHNIQUE: Multidetector CT imaging of the chest was performed using the standard  protocol during bolus administration of intravenous contrast. Multiplanar CT image reconstructions and MIPs were obtained to evaluate the vascular anatomy. RADIATION DOSE REDUCTION: This exam was performed according to the departmental dose-optimization program which includes automated exposure control, adjustment of the mA and/or kV according to patient size and/or use of iterative reconstruction technique. CONTRAST:  75mL OMNIPAQUE IOHEXOL 350 MG/ML SOLN COMPARISON:  10/02/2023 FINDINGS: Cardiovascular: Satisfactory opacification of pulmonary arteries noted, and no pulmonary emboli identified. No evidence of thoracic aortic dissection or aneurysm. Mediastinum/Nodes: No masses or pathologically enlarged lymph nodes identified. Lungs/Pleura: Ill-defined masslike opacity is again seen in the anterior right upper lobe with adjacent mild bronchiectasis. This measures 3.0 x 2.8 cm on image 42/6, without significant change since previous study. A few sub-centimeter ill-defined nodular opacities in the left upper and lower lobes also unchanged. Increased dependent atelectasis is seen in both lung bases, with mucous plugging noted in several peripheral left lower lobe bronchi. Upper abdomen: No acute findings. Musculoskeletal: No suspicious bone lesions identified. Review of the MIP images confirms the above findings. IMPRESSION: No evidence of pulmonary embolism. Increased dependent atelectasis in both lung bases, with mucous plugging noted in several peripheral left lower lobe bronchi. Stable masslike opacity in anterior right upper lobe. Stable sub-centimeter nodular opacities in left upper and lower lobes. Electronically Signed   By: Danae Orleans M.D.   On: 10/08/2023 16:41   CT Chest Wo Contrast Result Date: 10/08/2023 CLINICAL DATA:  Follow-up right upper lobe non-small-cell lung cancer. History of breast and colon cancer. EXAM: CT CHEST WITHOUT CONTRAST TECHNIQUE: Multidetector CT imaging of the chest was  performed following the standard protocol without IV contrast. RADIATION DOSE REDUCTION: This exam was performed according to the departmental dose-optimization program which includes automated exposure control, adjustment of the mA and/or kV according to patient size and/or use of iterative reconstruction technique. COMPARISON:  04/10/2023 FINDINGS: Cardiovascular: Normal sized heart. Stable minimal pericardial effusion with a maximum thickness of 5 mm Atheromatous calcifications, including the coronary arteries and aorta. Mediastinum/Nodes: No enlarged mediastinal or axillary lymph nodes. Thyroid gland, trachea, and esophagus demonstrate no significant findings. Lungs/Pleura: The previously demonstrated 1.6 x 1.1 cm right upper lobe lung nodule currently measures 3.2 x 2.1 cm in corresponding dimensions on image number 33/8. This has a thicker soft tissue extension to the pleural surface with spread  along the pleural surface extending 4.5 cm. Is also an interval 2nd mildly thickened linear extension to the pleural surface posteriorly with extension along the pleural surface. No significant change in bilateral calcified granulomata. Previously demonstrated 0.9 cm ground-glass nodule in the left upper lobe measures 1.1 cm today on image number 62/8. A previously demonstrated 1.5 cm more inferiorly located left upper lobe ground-glass nodule currently measures 1.4 cm on image number 78/8. Additional patchy and consolidative densities previously demonstrated in the left lower lobe have resolved No new nodules.  No pleural fluid. Upper Abdomen: Atheromatous arterial calcifications. No evidence of upper abdominal metastatic disease. Musculoskeletal: Moderate thoracolumbar scoliosis and degenerative changes no evidence of bony metastatic disease IMPRESSION: 1. Interval increase in size of the right upper lobe lung nodule with thicker soft tissue extensions to the pleural surface and spread along the pleural surface. This  is compatible with interval growth of known malignancy. 2. Interval increase in size of 2 ground-glass nodules in the left upper lobe, most likely representing a mildly progressive atypical infectious process. 3. Interval resolution of additional patchy and consolidative densities previously demonstrated in the left lower lobe. 4. No evidence of metastatic disease. 5. Stable minimal pericardial effusion. 6. Calcific coronary artery and aortic atherosclerosis. Aortic Atherosclerosis (ICD10-I70.0). Electronically Signed   By: Beckie Salts M.D.   On: 10/08/2023 09:20   DG Chest Port 1 View Result Date: 10/08/2023 CLINICAL DATA:  Cough and shortness of breath. Right upper lobe non-small-cell lung cancer. Breast cancer. EXAM: PORTABLE CHEST 1 VIEW COMPARISON:  03/28/2023.  Chest CT dated 10/02/2023 FINDINGS: Right upper lobe mass, posttreatment changes without significant change since 10/02/2023 the lungs remain hyperexpanded. Minimal patchy densities in the mid left lung without significant change since 10/02/2023. No pleural fluid stable mildly enlarged cardiac silhouette and tortuous and partially calcified thoracic aorta. Stable mild-to-moderate thoracolumbar scoliosis and diffuse osteopenia. IMPRESSION: 1. Stable minimal patchy density in the left mid lung zone, possibly representing minimal pneumonia or focal inflammatory changes. 2. Stable hyperexpansion of the lungs compatible with COPD. 3. Stable right upper lobe mass, posttreatment changes. 4. Stable mild cardiomegaly. Electronically Signed   By: Beckie Salts M.D.   On: 10/08/2023 09:05   Intravitreal Injection, Pharmacologic Agent - OS - Left Eye Result Date: 10/01/2023 Time Out 10/01/2023. 1:28 PM. Confirmed correct patient, procedure, site, and patient consented. Anesthesia Topical anesthesia was used. Anesthetic medications included Lidocaine 2%, Proparacaine 0.5%. Procedure Preparation included 5% betadine to ocular surface, eyelid speculum. A  supplied needle was used. Injection: 1.25 mg Bevacizumab 1.25mg /0.69ml   Route: Intravitreal, Site: Left Eye   NDC: [id number], Lot: [id number], Expiration date: 11/06/2023 Post-op Post injection exam found visual acuity of at least counting fingers. The patient tolerated the procedure well. There were no complications. The patient received written and verbal post procedure care education. Post injection medications were not given.   OCT, Retina - OU - Both Eyes Result Date: 10/01/2023 Right Eye Quality was good. Central Foveal Thickness: 321. Progression has been stable. Findings include no IRF, no SRF, abnormal foveal contour, retinal drusen , subretinal hyper-reflective material, disciform scar, epiretinal membrane, pigment epithelial detachment, outer retinal atrophy (persistent thick SRHM/disciform scar -- stable from prior). Left Eye Quality was good. Central Foveal Thickness: 288. Progression has improved. Findings include no IRF, no SRF, abnormal foveal contour, retinal drusen , subretinal hyper-reflective material, pigment epithelial detachment, outer retinal atrophy (Mild interval improvement in PED/CNV and SRHM / edema overlying, Significant central GA / ORA ). Notes *Images  captured and stored on drive Diagnosis / Impression: OD: Exudative AMD with significant submacular scar/SRHM --  persistent thick SRHM/disciform scar -- no IRF/SRF OS: Mild interval improvement in PED/CNV and SRHM / edema overlying, Significant central GA / ORA Clinical management: See below Abbreviations: NFP - Normal foveal profile. CME - cystoid macular edema. PED - pigment epithelial detachment. IRF - intraretinal fluid. SRF - subretinal fluid. EZ - ellipsoid zone. ERM - epiretinal membrane. ORA - outer retinal atrophy. ORT - outer retinal tubulation. SRHM - subretinal hyper-reflective material   Microbiology: Results for orders placed or performed during the hospital encounter of 10/08/23  Resp panel by RT-PCR (RSV, Flu  A&B, Covid) Anterior Nasal Swab     Status: None   Collection Time: 10/08/23  8:52 AM   Specimen: Anterior Nasal Swab  Result Value Ref Range Status   SARS Coronavirus 2 by RT PCR NEGATIVE NEGATIVE Final   Influenza A by PCR NEGATIVE NEGATIVE Final   Influenza B by PCR NEGATIVE NEGATIVE Final    Comment: (NOTE) The Xpert Xpress SARS-CoV-2/FLU/RSV plus assay is intended as an aid in the diagnosis of influenza from Nasopharyngeal swab specimens and should not be used as a sole basis for treatment. Nasal washings and aspirates are unacceptable for Xpert Xpress SARS-CoV-2/FLU/RSV testing.  Fact Sheet for Patients: BloggerCourse.com  Fact Sheet for Healthcare Providers: SeriousBroker.it  This test is not yet approved or cleared by the [place] FDA and has been authorized for detection and/or diagnosis of SARS-CoV-2 by FDA under an Emergency Use Authorization (EUA). This EUA will remain in effect (meaning this test can be used) for the duration of the COVID-19 declaration under Section 564(b)(1) of the Act, 21 U.S.C. section 360bbb-3(b)(1), unless the authorization is terminated or revoked.     Resp Syncytial Virus by PCR NEGATIVE NEGATIVE Final    Comment: (NOTE) Fact Sheet for Patients: BloggerCourse.com  Fact Sheet for Healthcare Providers: SeriousBroker.it  This test is not yet approved or cleared by the [place] FDA and has been authorized for detection and/or diagnosis of SARS-CoV-2 by FDA under an Emergency Use Authorization (EUA). This EUA will remain in effect (meaning this test can be used) for the duration of the COVID-19 declaration under Section 564(b)(1) of the Act, 21 U.S.C. section 360bbb-3(b)(1), unless the authorization is terminated or revoked.  Performed at [facility], [address] [place], [place] [address]   Culture, blood (routine x 2)      Status: None   Collection Time: 10/08/23  8:52 AM   Specimen: BLOOD LEFT ARM  Result Value Ref Range Status   Specimen Description BLOOD LEFT ARM  Final   Special Requests   Final    BOTTLES DRAWN AEROBIC ONLY Blood Culture results may not be optimal due to an inadequate volume of blood received in culture bottles   Culture   Final    NO GROWTH 5 DAYS Performed at [facility], [address], [place], [place] [address]    Report Status 10/13/2023 FINAL  Final  Culture, blood (routine x 2)     Status: None   Collection Time: 10/08/23  9:09 AM   Specimen: BLOOD LEFT HAND  Result Value Ref Range Status   Specimen Description BLOOD LEFT HAND  Final   Special Requests BOTTLES DRAWN AEROBIC ONLY  Final   Culture   Final    NO GROWTH 5 DAYS Performed at [facility], [address], [place], [place] [address]  Report Status 10/13/2023 FINAL  Final  Respiratory (~20 pathogens) panel by PCR     Status: Abnormal   Collection Time: 10/08/23  8:04 PM   Specimen: Nasopharyngeal Swab; Respiratory  Result Value Ref Range Status   Adenovirus NOT DETECTED NOT DETECTED Final   Coronavirus 229E NOT DETECTED NOT DETECTED Final    Comment: (NOTE) The Coronavirus on the Respiratory Panel, DOES NOT test for the novel  Coronavirus (2019 nCoV)    Coronavirus HKU1 NOT DETECTED NOT DETECTED Final   Coronavirus NL63 NOT DETECTED NOT DETECTED Final   Coronavirus OC43 NOT DETECTED NOT DETECTED Final   Metapneumovirus NOT DETECTED NOT DETECTED Final   Rhinovirus / Enterovirus NOT DETECTED NOT DETECTED Final   Influenza A NOT DETECTED NOT DETECTED Final   Influenza B NOT DETECTED NOT DETECTED Final   Parainfluenza Virus 1 NOT DETECTED NOT DETECTED Final   Parainfluenza Virus 2 NOT DETECTED NOT DETECTED Final   Parainfluenza Virus 3 NOT DETECTED NOT DETECTED Final   Parainfluenza Virus 4 NOT DETECTED NOT DETECTED Final   Respiratory Syncytial Virus DETECTED (A) NOT DETECTED Final    Bordetella pertussis NOT DETECTED NOT DETECTED Final   Bordetella Parapertussis NOT DETECTED NOT DETECTED Final   Chlamydophila pneumoniae NOT DETECTED NOT DETECTED Final   Mycoplasma pneumoniae NOT DETECTED NOT DETECTED Final    Comment: Performed at [facility], [address], [place], [place] [address]  MRSA Next Gen by PCR, Nasal     Status: None   Collection Time: 10/08/23  8:04 PM   Specimen: Nasal Mucosa; Nasal Swab  Result Value Ref Range Status   MRSA by PCR Next Gen NOT DETECTED NOT DETECTED Final    Comment: (NOTE) The GeneXpert MRSA Assay (FDA approved for NASAL specimens only), is one component of a comprehensive MRSA colonization surveillance program. It is not intended to diagnose MRSA infection nor to guide or monitor treatment for MRSA infections. Test performance is not FDA approved in patients less than 18 years old. Performed at [facility], [address], [place], [place] [address]   Expectorated Sputum Assessment w Gram Stain, Rflx to Resp Cult     Status: None   Collection Time: 10/09/23  6:00 AM   Specimen: Expectorated Sputum  Result Value Ref Range Status   Specimen Description EXPECTORATED SPUTUM  Final   Special Requests NONE  Final   Sputum evaluation   Final    THIS SPECIMEN IS ACCEPTABLE FOR SPUTUM CULTURE Performed at [facility], [address] [place], [place] [address]    Report Status 10/09/2023 FINAL  Final  Culture, Respiratory w Gram Stain     Status: None   Collection Time: 10/09/23  6:00 AM  Result Value Ref Range Status   Specimen Description EXPECTORATED SPUTUM  Final   Special Requests NONE Reflexed from [id number]  Final   Gram Stain   Final    RARE SQUAMOUS EPITHELIAL CELLS PRESENT FEW WBC PRESENT,BOTH PMN AND MONONUCLEAR RARE GRAM VARIABLE ROD    Culture   Final    RARE Normal respiratory flora-no Staph aureus or Pseudomonas seen Performed at [facility], [address], [place], [place] [address]     Report Status 10/11/2023 FINAL  Final    Labs: CBC: Recent Labs  Lab 10/08/23 0852 10/09/23 0658 10/11/23 0926  WBC 13.3* 10.0 7.5  NEUTROABS 11.6*  --   --   HGB 12.6 11.5* 10.6*  HCT 37.1 33.7* 30.9*  MCV 94.6 92.8 93.4  PLT 231 183 179   Basic Metabolic Panel: Recent Labs  Lab 10/08/23 1016 10/09/23 0658 10/10/23 0555 10/11/23 0926 10/11/23 2152 10/12/23 0926  NA 134* 133* 130* 132*  --  132*  K 3.3* 3.8 3.3* 3.4*  --  4.5  CL 100 100 98 100  --  103  CO2 21* 21* 23 21*  --  21*  GLUCOSE 123* 109* 98 171*  --  123*  BUN 20 22 20 15   --  10  CREATININE 0.53 0.68 1.00 0.62  --  0.58  CALCIUM 8.4* 8.2* 8.0* 7.9*  --  8.1*  MG  --  2.1  --  2.2  --   --   PHOS  --  2.1* 2.2* 1.5* 3.0  --    Liver Function Tests: Recent Labs  Lab 10/08/23 1016  AST 24  ALT 14  ALKPHOS 43  BILITOT 0.8  PROT 6.6  ALBUMIN 3.2*   CBG: No results for input(s): "GLUCAP" in the last 168 hours.  Discharge time spent: greater than 30 minutes.  Signed: Alba Cory, MD [facility] 10/14/2023

## 2023-10-14 NOTE — Progress Notes (Signed)
 Patient d/c home education provided to Marissa Caldwell (son) on AVS and new medication. Education provided on how to administer neb treatment. Oxygen company stated they will bring a neb machine to home this afternoon per Nadyne Coombes, RN . Home health aid from [facility] arranged to start this afternoon. Pt d/c on 2L [place] portable oxygen tank in room brought by home oxygen company yesterday. Patients personal shower chair and all belongings given to Marissa Caldwell (son). Patient has dentures in mouth.

## 2023-10-15 DIAGNOSIS — I110 Hypertensive heart disease with heart failure: Secondary | ICD-10-CM | POA: Diagnosis not present

## 2023-10-15 DIAGNOSIS — H3530 Unspecified macular degeneration: Secondary | ICD-10-CM | POA: Diagnosis not present

## 2023-10-15 DIAGNOSIS — C3411 Malignant neoplasm of upper lobe, right bronchus or lung: Secondary | ICD-10-CM | POA: Diagnosis not present

## 2023-10-15 DIAGNOSIS — E785 Hyperlipidemia, unspecified: Secondary | ICD-10-CM | POA: Diagnosis not present

## 2023-10-15 DIAGNOSIS — F419 Anxiety disorder, unspecified: Secondary | ICD-10-CM | POA: Diagnosis not present

## 2023-10-15 DIAGNOSIS — I5032 Chronic diastolic (congestive) heart failure: Secondary | ICD-10-CM | POA: Diagnosis not present

## 2023-10-15 DIAGNOSIS — C50919 Malignant neoplasm of unspecified site of unspecified female breast: Secondary | ICD-10-CM | POA: Diagnosis not present

## 2023-10-15 DIAGNOSIS — H548 Legal blindness, as defined in USA: Secondary | ICD-10-CM | POA: Diagnosis not present

## 2023-10-15 DIAGNOSIS — Z79891 Long term (current) use of opiate analgesic: Secondary | ICD-10-CM | POA: Diagnosis not present

## 2023-10-15 DIAGNOSIS — Z7982 Long term (current) use of aspirin: Secondary | ICD-10-CM | POA: Diagnosis not present

## 2023-10-15 DIAGNOSIS — K5790 Diverticulosis of intestine, part unspecified, without perforation or abscess without bleeding: Secondary | ICD-10-CM | POA: Diagnosis not present

## 2023-10-15 DIAGNOSIS — J121 Respiratory syncytial virus pneumonia: Secondary | ICD-10-CM | POA: Diagnosis not present

## 2023-10-15 DIAGNOSIS — I2510 Atherosclerotic heart disease of native coronary artery without angina pectoris: Secondary | ICD-10-CM | POA: Diagnosis not present

## 2023-10-15 DIAGNOSIS — E039 Hypothyroidism, unspecified: Secondary | ICD-10-CM | POA: Diagnosis not present

## 2023-10-15 DIAGNOSIS — J219 Acute bronchiolitis, unspecified: Secondary | ICD-10-CM | POA: Diagnosis not present

## 2023-10-15 DIAGNOSIS — G8929 Other chronic pain: Secondary | ICD-10-CM | POA: Diagnosis not present

## 2023-10-15 DIAGNOSIS — J440 Chronic obstructive pulmonary disease with acute lower respiratory infection: Secondary | ICD-10-CM | POA: Diagnosis not present

## 2023-10-15 DIAGNOSIS — Z602 Problems related to living alone: Secondary | ICD-10-CM | POA: Diagnosis not present

## 2023-10-15 DIAGNOSIS — M1990 Unspecified osteoarthritis, unspecified site: Secondary | ICD-10-CM | POA: Diagnosis not present

## 2023-10-15 DIAGNOSIS — Z791 Long term (current) use of non-steroidal anti-inflammatories (NSAID): Secondary | ICD-10-CM | POA: Diagnosis not present

## 2023-10-15 DIAGNOSIS — Z7951 Long term (current) use of inhaled steroids: Secondary | ICD-10-CM | POA: Diagnosis not present

## 2023-10-15 DIAGNOSIS — H409 Unspecified glaucoma: Secondary | ICD-10-CM | POA: Diagnosis not present

## 2023-10-15 DIAGNOSIS — J9611 Chronic respiratory failure with hypoxia: Secondary | ICD-10-CM | POA: Diagnosis not present

## 2023-10-15 DIAGNOSIS — Z9981 Dependence on supplemental oxygen: Secondary | ICD-10-CM | POA: Diagnosis not present

## 2023-10-18 DIAGNOSIS — I110 Hypertensive heart disease with heart failure: Secondary | ICD-10-CM | POA: Diagnosis not present

## 2023-10-18 DIAGNOSIS — I5032 Chronic diastolic (congestive) heart failure: Secondary | ICD-10-CM | POA: Diagnosis not present

## 2023-10-18 DIAGNOSIS — J9611 Chronic respiratory failure with hypoxia: Secondary | ICD-10-CM | POA: Diagnosis not present

## 2023-10-18 DIAGNOSIS — I2510 Atherosclerotic heart disease of native coronary artery without angina pectoris: Secondary | ICD-10-CM | POA: Diagnosis not present

## 2023-10-18 DIAGNOSIS — C50919 Malignant neoplasm of unspecified site of unspecified female breast: Secondary | ICD-10-CM | POA: Diagnosis not present

## 2023-10-18 DIAGNOSIS — J219 Acute bronchiolitis, unspecified: Secondary | ICD-10-CM | POA: Diagnosis not present

## 2023-10-18 DIAGNOSIS — E039 Hypothyroidism, unspecified: Secondary | ICD-10-CM | POA: Diagnosis not present

## 2023-10-18 DIAGNOSIS — G8929 Other chronic pain: Secondary | ICD-10-CM | POA: Diagnosis not present

## 2023-10-18 DIAGNOSIS — J121 Respiratory syncytial virus pneumonia: Secondary | ICD-10-CM | POA: Diagnosis not present

## 2023-10-18 DIAGNOSIS — H548 Legal blindness, as defined in USA: Secondary | ICD-10-CM | POA: Diagnosis not present

## 2023-10-18 DIAGNOSIS — M1990 Unspecified osteoarthritis, unspecified site: Secondary | ICD-10-CM | POA: Diagnosis not present

## 2023-10-18 DIAGNOSIS — C3411 Malignant neoplasm of upper lobe, right bronchus or lung: Secondary | ICD-10-CM | POA: Diagnosis not present

## 2023-10-18 DIAGNOSIS — K5790 Diverticulosis of intestine, part unspecified, without perforation or abscess without bleeding: Secondary | ICD-10-CM | POA: Diagnosis not present

## 2023-10-18 DIAGNOSIS — J440 Chronic obstructive pulmonary disease with acute lower respiratory infection: Secondary | ICD-10-CM | POA: Diagnosis not present

## 2023-10-18 DIAGNOSIS — E785 Hyperlipidemia, unspecified: Secondary | ICD-10-CM | POA: Diagnosis not present

## 2023-10-18 DIAGNOSIS — F419 Anxiety disorder, unspecified: Secondary | ICD-10-CM | POA: Diagnosis not present

## 2023-10-22 DIAGNOSIS — C50919 Malignant neoplasm of unspecified site of unspecified female breast: Secondary | ICD-10-CM | POA: Diagnosis not present

## 2023-10-22 DIAGNOSIS — C3411 Malignant neoplasm of upper lobe, right bronchus or lung: Secondary | ICD-10-CM | POA: Diagnosis not present

## 2023-10-22 DIAGNOSIS — G8929 Other chronic pain: Secondary | ICD-10-CM | POA: Diagnosis not present

## 2023-10-22 DIAGNOSIS — J219 Acute bronchiolitis, unspecified: Secondary | ICD-10-CM | POA: Diagnosis not present

## 2023-10-22 DIAGNOSIS — E039 Hypothyroidism, unspecified: Secondary | ICD-10-CM | POA: Diagnosis not present

## 2023-10-22 DIAGNOSIS — F419 Anxiety disorder, unspecified: Secondary | ICD-10-CM | POA: Diagnosis not present

## 2023-10-22 DIAGNOSIS — J9611 Chronic respiratory failure with hypoxia: Secondary | ICD-10-CM | POA: Diagnosis not present

## 2023-10-22 DIAGNOSIS — J121 Respiratory syncytial virus pneumonia: Secondary | ICD-10-CM | POA: Diagnosis not present

## 2023-10-22 DIAGNOSIS — E785 Hyperlipidemia, unspecified: Secondary | ICD-10-CM | POA: Diagnosis not present

## 2023-10-22 DIAGNOSIS — I110 Hypertensive heart disease with heart failure: Secondary | ICD-10-CM | POA: Diagnosis not present

## 2023-10-22 DIAGNOSIS — M1990 Unspecified osteoarthritis, unspecified site: Secondary | ICD-10-CM | POA: Diagnosis not present

## 2023-10-22 DIAGNOSIS — J440 Chronic obstructive pulmonary disease with acute lower respiratory infection: Secondary | ICD-10-CM | POA: Diagnosis not present

## 2023-10-22 DIAGNOSIS — I2510 Atherosclerotic heart disease of native coronary artery without angina pectoris: Secondary | ICD-10-CM | POA: Diagnosis not present

## 2023-10-22 DIAGNOSIS — K5790 Diverticulosis of intestine, part unspecified, without perforation or abscess without bleeding: Secondary | ICD-10-CM | POA: Diagnosis not present

## 2023-10-22 DIAGNOSIS — I5032 Chronic diastolic (congestive) heart failure: Secondary | ICD-10-CM | POA: Diagnosis not present

## 2023-10-22 DIAGNOSIS — H548 Legal blindness, as defined in USA: Secondary | ICD-10-CM | POA: Diagnosis not present

## 2023-10-23 DIAGNOSIS — J9691 Respiratory failure, unspecified with hypoxia: Secondary | ICD-10-CM | POA: Diagnosis not present

## 2023-10-23 DIAGNOSIS — Z8701 Personal history of pneumonia (recurrent): Secondary | ICD-10-CM | POA: Diagnosis not present

## 2023-10-23 DIAGNOSIS — E039 Hypothyroidism, unspecified: Secondary | ICD-10-CM | POA: Diagnosis not present

## 2023-10-23 DIAGNOSIS — Z8619 Personal history of other infectious and parasitic diseases: Secondary | ICD-10-CM | POA: Diagnosis not present

## 2023-10-23 DIAGNOSIS — J449 Chronic obstructive pulmonary disease, unspecified: Secondary | ICD-10-CM | POA: Diagnosis not present

## 2023-10-23 DIAGNOSIS — I10 Essential (primary) hypertension: Secondary | ICD-10-CM | POA: Diagnosis not present

## 2023-10-23 DIAGNOSIS — C3411 Malignant neoplasm of upper lobe, right bronchus or lung: Secondary | ICD-10-CM | POA: Diagnosis not present

## 2023-10-25 DIAGNOSIS — I110 Hypertensive heart disease with heart failure: Secondary | ICD-10-CM | POA: Diagnosis not present

## 2023-10-25 DIAGNOSIS — C50919 Malignant neoplasm of unspecified site of unspecified female breast: Secondary | ICD-10-CM | POA: Diagnosis not present

## 2023-10-25 DIAGNOSIS — M1990 Unspecified osteoarthritis, unspecified site: Secondary | ICD-10-CM | POA: Diagnosis not present

## 2023-10-25 DIAGNOSIS — I5032 Chronic diastolic (congestive) heart failure: Secondary | ICD-10-CM | POA: Diagnosis not present

## 2023-10-25 DIAGNOSIS — H548 Legal blindness, as defined in USA: Secondary | ICD-10-CM | POA: Diagnosis not present

## 2023-10-25 DIAGNOSIS — E785 Hyperlipidemia, unspecified: Secondary | ICD-10-CM | POA: Diagnosis not present

## 2023-10-25 DIAGNOSIS — F419 Anxiety disorder, unspecified: Secondary | ICD-10-CM | POA: Diagnosis not present

## 2023-10-25 DIAGNOSIS — G8929 Other chronic pain: Secondary | ICD-10-CM | POA: Diagnosis not present

## 2023-10-25 DIAGNOSIS — J9611 Chronic respiratory failure with hypoxia: Secondary | ICD-10-CM | POA: Diagnosis not present

## 2023-10-25 DIAGNOSIS — I2510 Atherosclerotic heart disease of native coronary artery without angina pectoris: Secondary | ICD-10-CM | POA: Diagnosis not present

## 2023-10-25 DIAGNOSIS — E039 Hypothyroidism, unspecified: Secondary | ICD-10-CM | POA: Diagnosis not present

## 2023-10-25 DIAGNOSIS — J121 Respiratory syncytial virus pneumonia: Secondary | ICD-10-CM | POA: Diagnosis not present

## 2023-10-25 DIAGNOSIS — C3411 Malignant neoplasm of upper lobe, right bronchus or lung: Secondary | ICD-10-CM | POA: Diagnosis not present

## 2023-10-25 DIAGNOSIS — J440 Chronic obstructive pulmonary disease with acute lower respiratory infection: Secondary | ICD-10-CM | POA: Diagnosis not present

## 2023-10-25 DIAGNOSIS — K5790 Diverticulosis of intestine, part unspecified, without perforation or abscess without bleeding: Secondary | ICD-10-CM | POA: Diagnosis not present

## 2023-10-25 DIAGNOSIS — J219 Acute bronchiolitis, unspecified: Secondary | ICD-10-CM | POA: Diagnosis not present

## 2023-10-26 ENCOUNTER — Other Ambulatory Visit: Payer: Self-pay | Admitting: Hematology

## 2023-10-29 DIAGNOSIS — H548 Legal blindness, as defined in USA: Secondary | ICD-10-CM | POA: Diagnosis not present

## 2023-10-29 DIAGNOSIS — G8929 Other chronic pain: Secondary | ICD-10-CM | POA: Diagnosis not present

## 2023-10-29 DIAGNOSIS — J219 Acute bronchiolitis, unspecified: Secondary | ICD-10-CM | POA: Diagnosis not present

## 2023-10-29 DIAGNOSIS — C3411 Malignant neoplasm of upper lobe, right bronchus or lung: Secondary | ICD-10-CM | POA: Diagnosis not present

## 2023-10-29 DIAGNOSIS — J440 Chronic obstructive pulmonary disease with acute lower respiratory infection: Secondary | ICD-10-CM | POA: Diagnosis not present

## 2023-10-29 DIAGNOSIS — E785 Hyperlipidemia, unspecified: Secondary | ICD-10-CM | POA: Diagnosis not present

## 2023-10-29 DIAGNOSIS — I110 Hypertensive heart disease with heart failure: Secondary | ICD-10-CM | POA: Diagnosis not present

## 2023-10-29 DIAGNOSIS — K5790 Diverticulosis of intestine, part unspecified, without perforation or abscess without bleeding: Secondary | ICD-10-CM | POA: Diagnosis not present

## 2023-10-29 DIAGNOSIS — J9611 Chronic respiratory failure with hypoxia: Secondary | ICD-10-CM | POA: Diagnosis not present

## 2023-10-29 DIAGNOSIS — J121 Respiratory syncytial virus pneumonia: Secondary | ICD-10-CM | POA: Diagnosis not present

## 2023-10-29 DIAGNOSIS — F419 Anxiety disorder, unspecified: Secondary | ICD-10-CM | POA: Diagnosis not present

## 2023-10-29 DIAGNOSIS — I2510 Atherosclerotic heart disease of native coronary artery without angina pectoris: Secondary | ICD-10-CM | POA: Diagnosis not present

## 2023-10-29 DIAGNOSIS — I5032 Chronic diastolic (congestive) heart failure: Secondary | ICD-10-CM | POA: Diagnosis not present

## 2023-10-29 DIAGNOSIS — E039 Hypothyroidism, unspecified: Secondary | ICD-10-CM | POA: Diagnosis not present

## 2023-10-29 DIAGNOSIS — C50919 Malignant neoplasm of unspecified site of unspecified female breast: Secondary | ICD-10-CM | POA: Diagnosis not present

## 2023-10-29 DIAGNOSIS — M1990 Unspecified osteoarthritis, unspecified site: Secondary | ICD-10-CM | POA: Diagnosis not present

## 2023-10-30 DIAGNOSIS — C50919 Malignant neoplasm of unspecified site of unspecified female breast: Secondary | ICD-10-CM | POA: Diagnosis not present

## 2023-10-30 DIAGNOSIS — I110 Hypertensive heart disease with heart failure: Secondary | ICD-10-CM | POA: Diagnosis not present

## 2023-10-30 DIAGNOSIS — J219 Acute bronchiolitis, unspecified: Secondary | ICD-10-CM | POA: Diagnosis not present

## 2023-10-30 DIAGNOSIS — M1990 Unspecified osteoarthritis, unspecified site: Secondary | ICD-10-CM | POA: Diagnosis not present

## 2023-10-30 DIAGNOSIS — H548 Legal blindness, as defined in USA: Secondary | ICD-10-CM | POA: Diagnosis not present

## 2023-10-30 DIAGNOSIS — E785 Hyperlipidemia, unspecified: Secondary | ICD-10-CM | POA: Diagnosis not present

## 2023-10-30 DIAGNOSIS — J9611 Chronic respiratory failure with hypoxia: Secondary | ICD-10-CM | POA: Diagnosis not present

## 2023-10-30 DIAGNOSIS — G8929 Other chronic pain: Secondary | ICD-10-CM | POA: Diagnosis not present

## 2023-10-30 DIAGNOSIS — E039 Hypothyroidism, unspecified: Secondary | ICD-10-CM | POA: Diagnosis not present

## 2023-10-30 DIAGNOSIS — J440 Chronic obstructive pulmonary disease with acute lower respiratory infection: Secondary | ICD-10-CM | POA: Diagnosis not present

## 2023-10-30 DIAGNOSIS — J121 Respiratory syncytial virus pneumonia: Secondary | ICD-10-CM | POA: Diagnosis not present

## 2023-10-30 DIAGNOSIS — F419 Anxiety disorder, unspecified: Secondary | ICD-10-CM | POA: Diagnosis not present

## 2023-10-30 DIAGNOSIS — K5790 Diverticulosis of intestine, part unspecified, without perforation or abscess without bleeding: Secondary | ICD-10-CM | POA: Diagnosis not present

## 2023-10-30 DIAGNOSIS — I2510 Atherosclerotic heart disease of native coronary artery without angina pectoris: Secondary | ICD-10-CM | POA: Diagnosis not present

## 2023-10-30 DIAGNOSIS — I5032 Chronic diastolic (congestive) heart failure: Secondary | ICD-10-CM | POA: Diagnosis not present

## 2023-10-30 DIAGNOSIS — C3411 Malignant neoplasm of upper lobe, right bronchus or lung: Secondary | ICD-10-CM | POA: Diagnosis not present

## 2023-11-01 DIAGNOSIS — J121 Respiratory syncytial virus pneumonia: Secondary | ICD-10-CM | POA: Diagnosis not present

## 2023-11-01 DIAGNOSIS — K5790 Diverticulosis of intestine, part unspecified, without perforation or abscess without bleeding: Secondary | ICD-10-CM | POA: Diagnosis not present

## 2023-11-01 DIAGNOSIS — E039 Hypothyroidism, unspecified: Secondary | ICD-10-CM | POA: Diagnosis not present

## 2023-11-01 DIAGNOSIS — J219 Acute bronchiolitis, unspecified: Secondary | ICD-10-CM | POA: Diagnosis not present

## 2023-11-01 DIAGNOSIS — C50919 Malignant neoplasm of unspecified site of unspecified female breast: Secondary | ICD-10-CM | POA: Diagnosis not present

## 2023-11-01 DIAGNOSIS — I2510 Atherosclerotic heart disease of native coronary artery without angina pectoris: Secondary | ICD-10-CM | POA: Diagnosis not present

## 2023-11-01 DIAGNOSIS — I5032 Chronic diastolic (congestive) heart failure: Secondary | ICD-10-CM | POA: Diagnosis not present

## 2023-11-01 DIAGNOSIS — J9611 Chronic respiratory failure with hypoxia: Secondary | ICD-10-CM | POA: Diagnosis not present

## 2023-11-01 DIAGNOSIS — H548 Legal blindness, as defined in USA: Secondary | ICD-10-CM | POA: Diagnosis not present

## 2023-11-01 DIAGNOSIS — F419 Anxiety disorder, unspecified: Secondary | ICD-10-CM | POA: Diagnosis not present

## 2023-11-01 DIAGNOSIS — C3411 Malignant neoplasm of upper lobe, right bronchus or lung: Secondary | ICD-10-CM | POA: Diagnosis not present

## 2023-11-01 DIAGNOSIS — I110 Hypertensive heart disease with heart failure: Secondary | ICD-10-CM | POA: Diagnosis not present

## 2023-11-01 DIAGNOSIS — J440 Chronic obstructive pulmonary disease with acute lower respiratory infection: Secondary | ICD-10-CM | POA: Diagnosis not present

## 2023-11-01 DIAGNOSIS — M1990 Unspecified osteoarthritis, unspecified site: Secondary | ICD-10-CM | POA: Diagnosis not present

## 2023-11-01 DIAGNOSIS — G8929 Other chronic pain: Secondary | ICD-10-CM | POA: Diagnosis not present

## 2023-11-01 DIAGNOSIS — E785 Hyperlipidemia, unspecified: Secondary | ICD-10-CM | POA: Diagnosis not present

## 2023-11-05 DIAGNOSIS — I5032 Chronic diastolic (congestive) heart failure: Secondary | ICD-10-CM | POA: Diagnosis not present

## 2023-11-05 DIAGNOSIS — E039 Hypothyroidism, unspecified: Secondary | ICD-10-CM | POA: Diagnosis not present

## 2023-11-05 DIAGNOSIS — J449 Chronic obstructive pulmonary disease, unspecified: Secondary | ICD-10-CM | POA: Diagnosis not present

## 2023-11-05 DIAGNOSIS — I2510 Atherosclerotic heart disease of native coronary artery without angina pectoris: Secondary | ICD-10-CM | POA: Diagnosis not present

## 2023-11-05 DIAGNOSIS — J440 Chronic obstructive pulmonary disease with acute lower respiratory infection: Secondary | ICD-10-CM | POA: Diagnosis not present

## 2023-11-05 DIAGNOSIS — G8929 Other chronic pain: Secondary | ICD-10-CM | POA: Diagnosis not present

## 2023-11-05 DIAGNOSIS — C50919 Malignant neoplasm of unspecified site of unspecified female breast: Secondary | ICD-10-CM | POA: Diagnosis not present

## 2023-11-05 DIAGNOSIS — J121 Respiratory syncytial virus pneumonia: Secondary | ICD-10-CM | POA: Diagnosis not present

## 2023-11-05 DIAGNOSIS — J219 Acute bronchiolitis, unspecified: Secondary | ICD-10-CM | POA: Diagnosis not present

## 2023-11-05 DIAGNOSIS — I110 Hypertensive heart disease with heart failure: Secondary | ICD-10-CM | POA: Diagnosis not present

## 2023-11-05 DIAGNOSIS — M1990 Unspecified osteoarthritis, unspecified site: Secondary | ICD-10-CM | POA: Diagnosis not present

## 2023-11-05 DIAGNOSIS — F419 Anxiety disorder, unspecified: Secondary | ICD-10-CM | POA: Diagnosis not present

## 2023-11-05 DIAGNOSIS — J9611 Chronic respiratory failure with hypoxia: Secondary | ICD-10-CM | POA: Diagnosis not present

## 2023-11-05 DIAGNOSIS — E785 Hyperlipidemia, unspecified: Secondary | ICD-10-CM | POA: Diagnosis not present

## 2023-11-05 DIAGNOSIS — K5790 Diverticulosis of intestine, part unspecified, without perforation or abscess without bleeding: Secondary | ICD-10-CM | POA: Diagnosis not present

## 2023-11-05 DIAGNOSIS — C3411 Malignant neoplasm of upper lobe, right bronchus or lung: Secondary | ICD-10-CM | POA: Diagnosis not present

## 2023-11-05 DIAGNOSIS — H548 Legal blindness, as defined in USA: Secondary | ICD-10-CM | POA: Diagnosis not present

## 2023-11-08 DIAGNOSIS — J9611 Chronic respiratory failure with hypoxia: Secondary | ICD-10-CM | POA: Diagnosis not present

## 2023-11-08 DIAGNOSIS — K5790 Diverticulosis of intestine, part unspecified, without perforation or abscess without bleeding: Secondary | ICD-10-CM | POA: Diagnosis not present

## 2023-11-08 DIAGNOSIS — H548 Legal blindness, as defined in USA: Secondary | ICD-10-CM | POA: Diagnosis not present

## 2023-11-08 DIAGNOSIS — I110 Hypertensive heart disease with heart failure: Secondary | ICD-10-CM | POA: Diagnosis not present

## 2023-11-08 DIAGNOSIS — M1990 Unspecified osteoarthritis, unspecified site: Secondary | ICD-10-CM | POA: Diagnosis not present

## 2023-11-08 DIAGNOSIS — C50919 Malignant neoplasm of unspecified site of unspecified female breast: Secondary | ICD-10-CM | POA: Diagnosis not present

## 2023-11-08 DIAGNOSIS — F419 Anxiety disorder, unspecified: Secondary | ICD-10-CM | POA: Diagnosis not present

## 2023-11-08 DIAGNOSIS — J121 Respiratory syncytial virus pneumonia: Secondary | ICD-10-CM | POA: Diagnosis not present

## 2023-11-08 DIAGNOSIS — G8929 Other chronic pain: Secondary | ICD-10-CM | POA: Diagnosis not present

## 2023-11-08 DIAGNOSIS — J219 Acute bronchiolitis, unspecified: Secondary | ICD-10-CM | POA: Diagnosis not present

## 2023-11-08 DIAGNOSIS — C3411 Malignant neoplasm of upper lobe, right bronchus or lung: Secondary | ICD-10-CM | POA: Diagnosis not present

## 2023-11-08 DIAGNOSIS — E039 Hypothyroidism, unspecified: Secondary | ICD-10-CM | POA: Diagnosis not present

## 2023-11-08 DIAGNOSIS — E785 Hyperlipidemia, unspecified: Secondary | ICD-10-CM | POA: Diagnosis not present

## 2023-11-08 DIAGNOSIS — I2510 Atherosclerotic heart disease of native coronary artery without angina pectoris: Secondary | ICD-10-CM | POA: Diagnosis not present

## 2023-11-08 DIAGNOSIS — J440 Chronic obstructive pulmonary disease with acute lower respiratory infection: Secondary | ICD-10-CM | POA: Diagnosis not present

## 2023-11-08 DIAGNOSIS — I5032 Chronic diastolic (congestive) heart failure: Secondary | ICD-10-CM | POA: Diagnosis not present

## 2023-11-08 NOTE — Progress Notes (Signed)
 [facility] - Clinic Note  11/12/2023     CHIEF COMPLAINT Patient presents for Retina Follow Up    HISTORY OF PRESENT ILLNESS: Marissa Caldwell is a 89 y.o. female who presents to the clinic today for:   HPI     Retina Follow Up   Patient presents with  Wet AMD.  In both eyes.  This started 6 weeks ago.  I, the attending physician,  performed the HPI with the patient and updated documentation appropriately.        Comments   Patient here for 6 weeks retina follow up for exu ARMD OU. Patient states visiion not good. No eye pain. Had pneumonia and RSV.  Using a nebulizer 2 times a day.      Last edited by Rennis Chris, MD on 11/12/2023  4:00 PM.    Pt states vision is stable, no problems after the last injection  Referring physician:  Laurann Montana, MD [phone] [place],  [place] [address]  HISTORICAL INFORMATION:   Selected notes from the MEDICAL RECORD NUMBER Referred by Dr. Baker Pierini for concern of exudative ARMD OD   CURRENT MEDICATIONS: Current Outpatient Medications (Ophthalmic Drugs)  Medication Sig   brimonidine-timolol (COMBIGAN) 0.2-0.5 % ophthalmic solution Apply to eye.   latanoprost (XALATAN) 0.005 % ophthalmic solution Place 1 drop into both eyes at bedtime.   No current facility-administered medications for this visit. (Ophthalmic Drugs)   Current Outpatient Medications (Other)  Medication Sig   albuterol (PROVENTIL HFA;VENTOLIN HFA) 108 (90 Base) MCG/ACT inhaler Inhale 2 puffs into the lungs every 6 (six) hours as needed for wheezing or shortness of breath.   amLODipine (NORVASC) 5 MG tablet Take 1 tablet (5 mg total) by mouth daily.   arformoterol (BROVANA) 15 MCG/2ML NEBU Take 2 mLs (15 mcg total) by nebulization 2 (two) times daily.   budesonide (PULMICORT) 0.25 MG/2ML nebulizer solution Take 2 mLs (0.25 mg total) by nebulization 2 (two) times daily.   Calcium Carb-Cholecalciferol (CALCIUM + D3) 600-200  MG-UNIT TABS Take 1 tablet by mouth daily.   Cholecalciferol (VITAMIN D3) 2000 units capsule Take 4,000 Units by mouth daily.   Coenzyme Q10 (COQ10) 100 MG CAPS Take 100 mg by mouth daily.   diclofenac (CATAFLAM) 50 MG tablet Take 1 tablet (50 mg total) by mouth daily after breakfast.   famotidine (PEPCID) 20 MG tablet Take 20 mg by mouth daily.   gabapentin (NEURONTIN) 100 MG capsule Take 1 capsule (100 mg total) by mouth at bedtime.   guaiFENesin (MUCINEX) 600 MG 12 hr tablet Take 2 tablets (1,200 mg total) by mouth 2 (two) times daily.   ipratropium-albuterol (DUONEB) 0.5-2.5 (3) MG/3ML SOLN Take 3 mLs by nebulization every 6 (six) hours as needed.   levothyroxine (SYNTHROID, LEVOTHROID) 100 MCG tablet Take 100 mcg by mouth daily before breakfast.    LORazepam (ATIVAN) 0.5 MG tablet Take 0.25-0.5 mg by mouth every 8 (eight) hours as needed for anxiety.   LUTEIN PO Take 1 tablet by mouth daily.   Multiple Vitamin (MULTIVITAMIN) tablet Take 1 tablet by mouth daily.   nitroGLYCERIN (NITROSTAT) 0.4 MG SL tablet Place 1 tablet (0.4 mg total) under the tongue every 5 (five) minutes as needed for chest pain.   Omega-3 Fatty Acids (FISH OIL PO) Take 1 capsule by mouth daily.   pravastatin (PRAVACHOL) 40 MG tablet TAKE 1 TABLET BY MOUTH DAILY GENERIC EQUIVALENT FOR PRAVACHOL   solifenacin (VESICARE) 5 MG tablet  Take 5 mg by mouth daily.   tamoxifen (NOLVADEX) 20 MG tablet TAKE 1 TABLET BY MOUTH ONCE DAILY. APPOINTMENT REQUIRED FOR FUTURE REFILLS   traMADol (ULTRAM) 50 MG tablet Take 0.5-1 tablets (25-50 mg total) by mouth every 8 (eight) hours as needed.   No current facility-administered medications for this visit. (Other)   REVIEW OF SYSTEMS: ROS   Positive for: HENT, Endocrine, Cardiovascular, Eyes, Respiratory, Heme/Lymph Negative for: Constitutional, Gastrointestinal, Neurological, Skin, Genitourinary, Musculoskeletal, Psychiatric, Allergic/Imm Last edited by Laddie Aquas, COA on  11/12/2023 12:53 PM.     ALLERGIES Allergies  Allergen Reactions   Lipitor [Atorvastatin]     Elevated LFT's   PAST MEDICAL HISTORY Past Medical History:  Diagnosis Date   Anxiety    Arthritis    Breast cancer (HCC)    CAD (coronary artery disease)    a. 03/2017: 95% LCx stenosis --> PCI/DES placed   Cataract    Chest pain 03/27/2017   Chronic diastolic heart failure (HCC) 06/25/2017   Chronic lower back pain    Colon cancer (HCC) 11/2019   s/p colectomy   COPD (chronic obstructive pulmonary disease) (HCC)    Coronary artery disease    Diverticulitis    Diverticulosis    GERD (gastroesophageal reflux disease)    Glaucoma    Heart failure, type unknown (HCC) 03/11/2017   History of radiation therapy 08/05/19- 09/03/19   Right Breast/ SCV 16 fractions of 2.66 Gy each for a total of 42.56 Gy. Right breast boost 4 fractions of 2 Gy each to total 8 Gy.    Hyperlipidemia 03/11/2017   Hypertension    Hypothyroidism    Macular degeneration    wet and dry per pt's son   Personal history of chemotherapy    Personal history of radiation therapy    Pneumonia X 1   "years and years ago" (03/26/2017)   Shortness of breath 03/11/2017   Status post dilation of esophageal narrowing    Past Surgical History:  Procedure Laterality Date   BILATERAL OOPHORECTOMY Bilateral    BREAST LUMPECTOMY Right 06/19/2019   BREAST LUMPECTOMY WITH RADIOACTIVE SEED AND SENTINEL LYMPH NODE BIOPSY Right 06/19/2019   Procedure: RIGHT BREAST LUMPECTOMY WITH RADIOACTIVE SEED AND SENTINEL LYMPH NODE BIOPSY;  Surgeon: Griselda Miner, MD;  Location: [facility] OR;  Service: General;  Laterality: Right;   CATARACT EXTRACTION     CATARACT EXTRACTION W/ INTRAOCULAR LENS  IMPLANT, BILATERAL Bilateral    COLOSTOMY     Greater than 10 yrs   CORONARY ANGIOPLASTY WITH STENT PLACEMENT  03/26/2017   LAPAROSCOPIC RIGHT COLECTOMY Right 12/15/2019   Procedure: LAPAROSCOPIC ASSISTED RIGHT COLECTOMY;  Surgeon: Griselda Miner, MD;   Location: [facility] OR;  Service: General;  Laterality: Right;   LEFT HEART CATH AND CORONARY ANGIOGRAPHY N/A 03/26/2017   Procedure: Left Heart Cath and Coronary Angiography;  Surgeon: Lyn Records, MD;  Location: [facility] INVASIVE CV LAB;  Service: Cardiovascular;  Laterality: N/A;   NERVE SURGERY     "lump on my sciatic nerve was removed years ago"   SHOULDER OPEN ROTATOR CUFF REPAIR Right    THYROIDECTOMY, PARTIAL     TONSILLECTOMY     UPPER GASTROINTESTINAL ENDOSCOPY     > ten yrs   FAMILY HISTORY Family History  Problem Relation Age of Onset   CAD Mother    Cataracts Mother    Coronary artery disease Father    Leukemia Sister    Amblyopia Neg Hx    Blindness  Neg Hx    Diabetes Neg Hx    Glaucoma Neg Hx    Macular degeneration Neg Hx    Retinal detachment Neg Hx    Strabismus Neg Hx    Retinitis pigmentosa Neg Hx    Colon cancer Neg Hx    Colon polyps Neg Hx    Esophageal cancer Neg Hx    Rectal cancer Neg Hx    Stomach cancer Neg Hx    SOCIAL HISTORY Social History   Tobacco Use   Smoking status: Former    Current packs/day: 0.50    Average packs/day: 0.5 packs/day for 60.0 years (30.0 ttl pk-yrs)    Types: Cigarettes   Smokeless tobacco: Never  Vaping Use   Vaping status: Never Used  Substance Use Topics   Alcohol use: Not Currently    Comment: twice a month   Drug use: No       OPHTHALMIC EXAM:  Base Eye Exam     Visual Acuity (Snellen - Linear)       Right Left   Dist [place] CF at 3' CF at 3'         Tonometry (Tonopen, 12:51 PM)       Right Left   Pressure 08 06         Pupils       Dark Light Shape React APD   Right 3 2 Round Sluggish None   Left 3 2 Round Sluggish None         Visual Fields (Counting fingers)       Left Right    Full Full         Extraocular Movement       Right Left    Full, Ortho Full, Ortho         Neuro/Psych     Oriented x3: Yes   Mood/Affect: Normal         Dilation     Both eyes: 1.0%  Mydriacyl, 2.5% Phenylephrine @ 12:50 PM           Slit Lamp and Fundus Exam     Slit Lamp Exam       Right Left   Lids/Lashes Dermatochalasis - upper lid, Telangiectasia, Meibomian gland dysfunction - improving Dermatochalasis - upper lid, Telangiectasia, Meibomian gland dysfunction   Conjunctiva/Sclera White and quiet trace Injection   Cornea Arcus, 2-3+ Punctate epithelial erosions, 3+ fine endo pigment, tear film debris, mild central haze Arcus, 3+ Punctate epithelial erosions, endo pigment / Krunkenberg's spindle, mild central haze   Anterior Chamber Deep and quiet Deep and quiet   Iris Round and dilated Round and dilated   Lens Three piece PC IOL in good position, open PC PC IOL in good position, clear PC   Anterior Vitreous Vitreous syneresis, Posterior vitreous detachment, mild vitreous condensations Mild Vitreous syneresis, Posterior vitreous detachment         Fundus Exam       Right Left   Disc sharp rim, 360 Peripapillary atrophy, 2-3+Pallor 360 Peripapillary atrophy, 2+pallor, sharp rim   C/D Ratio 0.2 0.1   Macula Blunted foveal reflex, Drusen, RPE mottling, clumping and atrophy, +PED, +CNVM, sub-retinal central Disciform scar with subretinal fibrosis, no heme, central pigment clumping Blunted foveal reflex, Drusen, RPE mottling, clumping, and atrophy, focal GA nasal macula extending into fovea, CNV with +edema temporal to fovea -- improving, early fibrosis, no frank heme   Vessels attenuated, mild tortuosity attenuated, mild tortuosity   Periphery Attached, reticular degeneration,  No heme Attached, reticular degeneration, Focal IRH above disc           IMAGING AND PROCEDURES  Imaging and Procedures for @TODAY @  OCT, Retina - OU - Both Eyes       Right Eye Quality was good. Central Foveal Thickness: 263. Progression has been stable. Findings include no IRF, no SRF, abnormal foveal contour, retinal drusen , subretinal hyper-reflective material, disciform scar,  epiretinal membrane, pigment epithelial detachment, outer retinal atrophy (persistent thick SRHM/disciform scar -- stable from prior).   Left Eye Quality was good. Central Foveal Thickness: 249. Progression has improved. Findings include no IRF, no SRF, abnormal foveal contour, retinal drusen , subretinal hyper-reflective material, pigment epithelial detachment, outer retinal atrophy (Mild interval improvement in PED/CNV and SRHM / edema overlying, Significant central GA / ORA ).   Notes *Images captured and stored on drive  Diagnosis / Impression:  OD: Exudative AMD with significant submacular scar/SRHM --  persistent thick SRHM/disciform scar -- no IRF/SRF OS: Mild interval improvement in PED/CNV and SRHM / edema overlying, Significant central GA / ORA   Clinical management:  See below  Abbreviations: NFP - Normal foveal profile. CME - cystoid macular edema. PED - pigment epithelial detachment. IRF - intraretinal fluid. SRF - subretinal fluid. EZ - ellipsoid zone. ERM - epiretinal membrane. ORA - outer retinal atrophy. ORT - outer retinal tubulation. SRHM - subretinal hyper-reflective material       Intravitreal Injection, Pharmacologic Agent - OS - Left Eye       Time Out 11/12/2023. 1:06 PM. Confirmed correct patient, procedure, site, and patient consented.   Anesthesia Topical anesthesia was used. Anesthetic medications included Lidocaine 2%, Proparacaine 0.5%.   Procedure Preparation included 5% betadine to ocular surface, eyelid speculum. A supplied needle was used.   Injection: 1.25 mg Bevacizumab 1.25mg /0.89ml   Route: Intravitreal, Site: Left Eye   NDC: [id number], Lot: [id number], Expiration date: 12/22/2023   Post-op Post injection exam found visual acuity of at least counting fingers. The patient tolerated the procedure well. There were no complications. The patient received written and verbal post procedure care education. Post injection medications were not given.             ASSESSMENT/PLAN:    ICD-10-CM   1. 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 Exudative age-related macular degeneration of left eye with active choroidal neovascularization (HCC)  H35.3221 OCT, Retina - OU - Both Eyes    Intravitreal Injection, Pharmacologic Agent - OS - Left Eye    Bevacizumab (AVASTIN) SOLN 1.25 mg    3. Posterior vitreous detachment of both eyes  H43.813     4. Pigmentary glaucoma of both eyes, mild stage  H40.1331     5. Pseudophakia of both eyes  Z96.1     6. Dry eyes  H04.123      1. Exudative age related macular degeneration, OD -- inactive CNV / scar  - onset ~2 mos prior to presentation, waited for scheduled appt with Dr. Alben Spittle  - S/P IVA OD #1 (05.20.19), #2 (07.02.19), #3 (07.30.19) - S/P IVE OD #1 (08.27.19), #2 (09.24.19), #3 (11.05.19), #4 (12.17.19), #5 (02.21.20), #6 (05.05.20), # 7 (08.05.20), #8 (9.2.20), #9 (11.10.20), #10 (02.12.21), #11 (05.12.21), #12 (08.13.21)  - VA remains CF - OCT with persistent thick /SRHM/disciform scar centrally  - discussed findings and guarded prognosis and treatment options - due to stable, inactive  submacular scar and VA CF -- recommend holding off on injection today  - pt in agreement  2. Exudative age related macular degeneration, left eye  - interval conversion from nonexudative ARMD noted on 01.10.25 visit  -- pt was lost to retina f/u from Jan 2024 to Jan 2025 - referred back here by Dr. Zenaida Niece - s/p IVA OS #1 (01.10.25), #2 (02.10.25)  - BCVA OS: stable at CF 3'  - OCT shows Interval improvement PED/CNV and SRHM/edema overlying, Significant central GA / ORA at 6 weeks - recommend IVA OS #3 today 03.24.25 with f/u extended to 8 weeks - pt wishes to proceed with injection - RBA of procedure discussed, questions answered - Avastin consent obtained, signed, and scanned 01.10.25 - see procedure note   - f/u 8  weeks, DFE, OCT, possible injection  3. PVD / vitreous syneresis OU  Discussed findings and prognosis  No RT or RD on 360 peripheral exam  Reviewed s/s of RT/RD  Strict return precautions for any such RT/RD symptoms  4. Pigmentary glaucoma OU-   - using latanoprost OU qhs / brimonidine BID OU  - IOP good today -- 08,06  - monitor  5. Pseudophakia OU  - s/p CE/IOL OU  - beautiful surgery, doing well  - monitor  6. Dry eyes OU  - recommend artificial tears and lubricating ointment as needed  Ophthalmic Meds Ordered this visit:  Meds ordered this encounter  Medications   Bevacizumab (AVASTIN) SOLN 1.25 mg     Return in about 8 weeks (around 01/07/2024) for f/u exu ARMD OU, DFE, OCT, Possible Injxn.  There are no Patient Instructions on file for this visit.  This document serves as a record of services personally performed by Karie Chimera, MD, PhD. It was created on their behalf by Glee Arvin. Manson Passey, OA an ophthalmic technician. The creation of this record is the provider's dictation and/or activities during the visit.    Electronically signed by: Glee Arvin. Manson Passey, OA 11/12/23 4:01 PM  Karie Chimera, M.D., Ph.D. Diseases & Surgery of the Retina and Vitreous [facility] 11/12/2023   I have reviewed the above documentation for accuracy and completeness, and I agree with the above. Karie Chimera, M.D., Ph.D. 11/12/23 4:02 PM   Abbreviations: M myopia (nearsighted); A astigmatism; H hyperopia (farsighted); P presbyopia; Mrx spectacle prescription;  CTL contact lenses; OD right eye; OS left eye; OU both eyes  XT exotropia; ET esotropia; PEK punctate epithelial keratitis; PEE punctate epithelial erosions; DES dry eye syndrome; MGD meibomian gland dysfunction; ATs artificial tears; PFAT's preservative free artificial tears; NSC nuclear sclerotic cataract; PSC posterior subcapsular cataract; ERM epi-retinal membrane; PVD posterior vitreous detachment; RD  retinal detachment; DM diabetes mellitus; DR diabetic retinopathy; NPDR non-proliferative diabetic retinopathy; PDR proliferative diabetic retinopathy; CSME clinically significant macular edema; DME diabetic macular edema; dbh dot blot hemorrhages; CWS cotton wool spot; POAG primary open angle glaucoma; C/D cup-to-disc ratio; HVF humphrey visual field; GVF goldmann visual field; OCT optical coherence tomography; IOP intraocular pressure; BRVO Branch retinal vein occlusion; CRVO central retinal vein occlusion; CRAO central retinal artery occlusion; BRAO branch retinal artery occlusion; RT retinal tear; SB scleral buckle; PPV pars plana vitrectomy; VH Vitreous hemorrhage; PRP panretinal laser photocoagulation; IVK intravitreal kenalog; VMT vitreomacular traction; MH Macular hole;  NVD neovascularization of the disc; NVE neovascularization elsewhere; AREDS age related eye disease study; ARMD age related macular degeneration; POAG primary open angle glaucoma; EBMD epithelial/anterior basement membrane dystrophy; ACIOL  anterior chamber intraocular lens; IOL intraocular lens; PCIOL posterior chamber intraocular lens; Phaco/IOL phacoemulsification with intraocular lens placement; PRK photorefractive keratectomy; LASIK laser assisted in situ keratomileusis; HTN hypertension; DM diabetes mellitus; COPD chronic obstructive pulmonary disease

## 2023-11-09 DIAGNOSIS — J219 Acute bronchiolitis, unspecified: Secondary | ICD-10-CM | POA: Diagnosis not present

## 2023-11-09 DIAGNOSIS — K5790 Diverticulosis of intestine, part unspecified, without perforation or abscess without bleeding: Secondary | ICD-10-CM | POA: Diagnosis not present

## 2023-11-09 DIAGNOSIS — E785 Hyperlipidemia, unspecified: Secondary | ICD-10-CM | POA: Diagnosis not present

## 2023-11-09 DIAGNOSIS — F419 Anxiety disorder, unspecified: Secondary | ICD-10-CM | POA: Diagnosis not present

## 2023-11-09 DIAGNOSIS — J440 Chronic obstructive pulmonary disease with acute lower respiratory infection: Secondary | ICD-10-CM | POA: Diagnosis not present

## 2023-11-09 DIAGNOSIS — C3411 Malignant neoplasm of upper lobe, right bronchus or lung: Secondary | ICD-10-CM | POA: Diagnosis not present

## 2023-11-09 DIAGNOSIS — I110 Hypertensive heart disease with heart failure: Secondary | ICD-10-CM | POA: Diagnosis not present

## 2023-11-09 DIAGNOSIS — E039 Hypothyroidism, unspecified: Secondary | ICD-10-CM | POA: Diagnosis not present

## 2023-11-09 DIAGNOSIS — G8929 Other chronic pain: Secondary | ICD-10-CM | POA: Diagnosis not present

## 2023-11-09 DIAGNOSIS — H548 Legal blindness, as defined in USA: Secondary | ICD-10-CM | POA: Diagnosis not present

## 2023-11-09 DIAGNOSIS — J121 Respiratory syncytial virus pneumonia: Secondary | ICD-10-CM | POA: Diagnosis not present

## 2023-11-09 DIAGNOSIS — J9611 Chronic respiratory failure with hypoxia: Secondary | ICD-10-CM | POA: Diagnosis not present

## 2023-11-09 DIAGNOSIS — C50919 Malignant neoplasm of unspecified site of unspecified female breast: Secondary | ICD-10-CM | POA: Diagnosis not present

## 2023-11-09 DIAGNOSIS — M1990 Unspecified osteoarthritis, unspecified site: Secondary | ICD-10-CM | POA: Diagnosis not present

## 2023-11-09 DIAGNOSIS — I5032 Chronic diastolic (congestive) heart failure: Secondary | ICD-10-CM | POA: Diagnosis not present

## 2023-11-09 DIAGNOSIS — I2510 Atherosclerotic heart disease of native coronary artery without angina pectoris: Secondary | ICD-10-CM | POA: Diagnosis not present

## 2023-11-12 ENCOUNTER — Ambulatory Visit (INDEPENDENT_AMBULATORY_CARE_PROVIDER_SITE_OTHER): Payer: Medicare Other | Admitting: Ophthalmology

## 2023-11-12 ENCOUNTER — Encounter (INDEPENDENT_AMBULATORY_CARE_PROVIDER_SITE_OTHER): Payer: Self-pay | Admitting: Ophthalmology

## 2023-11-12 DIAGNOSIS — H353221 Exudative age-related macular degeneration, left eye, with active choroidal neovascularization: Secondary | ICD-10-CM

## 2023-11-12 DIAGNOSIS — H43813 Vitreous degeneration, bilateral: Secondary | ICD-10-CM

## 2023-11-12 DIAGNOSIS — H353213 Exudative age-related macular degeneration, right eye, with inactive scar: Secondary | ICD-10-CM

## 2023-11-12 DIAGNOSIS — H401331 Pigmentary glaucoma, bilateral, mild stage: Secondary | ICD-10-CM | POA: Diagnosis not present

## 2023-11-12 DIAGNOSIS — Z961 Presence of intraocular lens: Secondary | ICD-10-CM

## 2023-11-12 DIAGNOSIS — H04123 Dry eye syndrome of bilateral lacrimal glands: Secondary | ICD-10-CM

## 2023-11-12 MED ORDER — BEVACIZUMAB CHEMO INJECTION 1.25MG/0.05ML SYRINGE FOR KALEIDOSCOPE
1.2500 mg | INTRAVITREAL | Status: AC | PRN
  Administered 2023-11-12: 1.25 mg via INTRAVITREAL

## 2023-11-14 DIAGNOSIS — Z79891 Long term (current) use of opiate analgesic: Secondary | ICD-10-CM | POA: Diagnosis not present

## 2023-11-14 DIAGNOSIS — Z7982 Long term (current) use of aspirin: Secondary | ICD-10-CM | POA: Diagnosis not present

## 2023-11-14 DIAGNOSIS — C50919 Malignant neoplasm of unspecified site of unspecified female breast: Secondary | ICD-10-CM | POA: Diagnosis not present

## 2023-11-14 DIAGNOSIS — E785 Hyperlipidemia, unspecified: Secondary | ICD-10-CM | POA: Diagnosis not present

## 2023-11-14 DIAGNOSIS — J121 Respiratory syncytial virus pneumonia: Secondary | ICD-10-CM | POA: Diagnosis not present

## 2023-11-14 DIAGNOSIS — J440 Chronic obstructive pulmonary disease with acute lower respiratory infection: Secondary | ICD-10-CM | POA: Diagnosis not present

## 2023-11-14 DIAGNOSIS — I110 Hypertensive heart disease with heart failure: Secondary | ICD-10-CM | POA: Diagnosis not present

## 2023-11-14 DIAGNOSIS — F419 Anxiety disorder, unspecified: Secondary | ICD-10-CM | POA: Diagnosis not present

## 2023-11-14 DIAGNOSIS — Z791 Long term (current) use of non-steroidal anti-inflammatories (NSAID): Secondary | ICD-10-CM | POA: Diagnosis not present

## 2023-11-14 DIAGNOSIS — G8929 Other chronic pain: Secondary | ICD-10-CM | POA: Diagnosis not present

## 2023-11-14 DIAGNOSIS — M1990 Unspecified osteoarthritis, unspecified site: Secondary | ICD-10-CM | POA: Diagnosis not present

## 2023-11-14 DIAGNOSIS — H548 Legal blindness, as defined in USA: Secondary | ICD-10-CM | POA: Diagnosis not present

## 2023-11-14 DIAGNOSIS — J9611 Chronic respiratory failure with hypoxia: Secondary | ICD-10-CM | POA: Diagnosis not present

## 2023-11-14 DIAGNOSIS — Z7951 Long term (current) use of inhaled steroids: Secondary | ICD-10-CM | POA: Diagnosis not present

## 2023-11-14 DIAGNOSIS — E039 Hypothyroidism, unspecified: Secondary | ICD-10-CM | POA: Diagnosis not present

## 2023-11-14 DIAGNOSIS — C3411 Malignant neoplasm of upper lobe, right bronchus or lung: Secondary | ICD-10-CM | POA: Diagnosis not present

## 2023-11-14 DIAGNOSIS — I2510 Atherosclerotic heart disease of native coronary artery without angina pectoris: Secondary | ICD-10-CM | POA: Diagnosis not present

## 2023-11-14 DIAGNOSIS — Z602 Problems related to living alone: Secondary | ICD-10-CM | POA: Diagnosis not present

## 2023-11-14 DIAGNOSIS — J219 Acute bronchiolitis, unspecified: Secondary | ICD-10-CM | POA: Diagnosis not present

## 2023-11-14 DIAGNOSIS — H409 Unspecified glaucoma: Secondary | ICD-10-CM | POA: Diagnosis not present

## 2023-11-14 DIAGNOSIS — K5790 Diverticulosis of intestine, part unspecified, without perforation or abscess without bleeding: Secondary | ICD-10-CM | POA: Diagnosis not present

## 2023-11-14 DIAGNOSIS — H3530 Unspecified macular degeneration: Secondary | ICD-10-CM | POA: Diagnosis not present

## 2023-11-14 DIAGNOSIS — Z9981 Dependence on supplemental oxygen: Secondary | ICD-10-CM | POA: Diagnosis not present

## 2023-11-14 DIAGNOSIS — I5032 Chronic diastolic (congestive) heart failure: Secondary | ICD-10-CM | POA: Diagnosis not present

## 2023-11-15 DIAGNOSIS — G8929 Other chronic pain: Secondary | ICD-10-CM | POA: Diagnosis not present

## 2023-11-15 DIAGNOSIS — H548 Legal blindness, as defined in USA: Secondary | ICD-10-CM | POA: Diagnosis not present

## 2023-11-15 DIAGNOSIS — E039 Hypothyroidism, unspecified: Secondary | ICD-10-CM | POA: Diagnosis not present

## 2023-11-15 DIAGNOSIS — F419 Anxiety disorder, unspecified: Secondary | ICD-10-CM | POA: Diagnosis not present

## 2023-11-15 DIAGNOSIS — J219 Acute bronchiolitis, unspecified: Secondary | ICD-10-CM | POA: Diagnosis not present

## 2023-11-15 DIAGNOSIS — M1990 Unspecified osteoarthritis, unspecified site: Secondary | ICD-10-CM | POA: Diagnosis not present

## 2023-11-15 DIAGNOSIS — I5032 Chronic diastolic (congestive) heart failure: Secondary | ICD-10-CM | POA: Diagnosis not present

## 2023-11-15 DIAGNOSIS — I2510 Atherosclerotic heart disease of native coronary artery without angina pectoris: Secondary | ICD-10-CM | POA: Diagnosis not present

## 2023-11-15 DIAGNOSIS — I110 Hypertensive heart disease with heart failure: Secondary | ICD-10-CM | POA: Diagnosis not present

## 2023-11-15 DIAGNOSIS — K5790 Diverticulosis of intestine, part unspecified, without perforation or abscess without bleeding: Secondary | ICD-10-CM | POA: Diagnosis not present

## 2023-11-15 DIAGNOSIS — E785 Hyperlipidemia, unspecified: Secondary | ICD-10-CM | POA: Diagnosis not present

## 2023-11-15 DIAGNOSIS — C3411 Malignant neoplasm of upper lobe, right bronchus or lung: Secondary | ICD-10-CM | POA: Diagnosis not present

## 2023-11-15 DIAGNOSIS — C50919 Malignant neoplasm of unspecified site of unspecified female breast: Secondary | ICD-10-CM | POA: Diagnosis not present

## 2023-11-15 DIAGNOSIS — J121 Respiratory syncytial virus pneumonia: Secondary | ICD-10-CM | POA: Diagnosis not present

## 2023-11-15 DIAGNOSIS — J9611 Chronic respiratory failure with hypoxia: Secondary | ICD-10-CM | POA: Diagnosis not present

## 2023-11-15 DIAGNOSIS — J440 Chronic obstructive pulmonary disease with acute lower respiratory infection: Secondary | ICD-10-CM | POA: Diagnosis not present

## 2023-11-20 DIAGNOSIS — C3411 Malignant neoplasm of upper lobe, right bronchus or lung: Secondary | ICD-10-CM | POA: Diagnosis not present

## 2023-11-20 DIAGNOSIS — J219 Acute bronchiolitis, unspecified: Secondary | ICD-10-CM | POA: Diagnosis not present

## 2023-11-20 DIAGNOSIS — J440 Chronic obstructive pulmonary disease with acute lower respiratory infection: Secondary | ICD-10-CM | POA: Diagnosis not present

## 2023-11-20 DIAGNOSIS — I5032 Chronic diastolic (congestive) heart failure: Secondary | ICD-10-CM | POA: Diagnosis not present

## 2023-11-20 DIAGNOSIS — J9611 Chronic respiratory failure with hypoxia: Secondary | ICD-10-CM | POA: Diagnosis not present

## 2023-11-20 DIAGNOSIS — G8929 Other chronic pain: Secondary | ICD-10-CM | POA: Diagnosis not present

## 2023-11-20 DIAGNOSIS — C50919 Malignant neoplasm of unspecified site of unspecified female breast: Secondary | ICD-10-CM | POA: Diagnosis not present

## 2023-11-20 DIAGNOSIS — M1990 Unspecified osteoarthritis, unspecified site: Secondary | ICD-10-CM | POA: Diagnosis not present

## 2023-11-20 DIAGNOSIS — E039 Hypothyroidism, unspecified: Secondary | ICD-10-CM | POA: Diagnosis not present

## 2023-11-20 DIAGNOSIS — I110 Hypertensive heart disease with heart failure: Secondary | ICD-10-CM | POA: Diagnosis not present

## 2023-11-20 DIAGNOSIS — E785 Hyperlipidemia, unspecified: Secondary | ICD-10-CM | POA: Diagnosis not present

## 2023-11-20 DIAGNOSIS — F419 Anxiety disorder, unspecified: Secondary | ICD-10-CM | POA: Diagnosis not present

## 2023-11-20 DIAGNOSIS — J121 Respiratory syncytial virus pneumonia: Secondary | ICD-10-CM | POA: Diagnosis not present

## 2023-11-20 DIAGNOSIS — I2510 Atherosclerotic heart disease of native coronary artery without angina pectoris: Secondary | ICD-10-CM | POA: Diagnosis not present

## 2023-11-20 DIAGNOSIS — K5790 Diverticulosis of intestine, part unspecified, without perforation or abscess without bleeding: Secondary | ICD-10-CM | POA: Diagnosis not present

## 2023-11-20 DIAGNOSIS — H548 Legal blindness, as defined in USA: Secondary | ICD-10-CM | POA: Diagnosis not present

## 2023-11-21 ENCOUNTER — Other Ambulatory Visit: Payer: Self-pay

## 2023-11-21 DIAGNOSIS — J440 Chronic obstructive pulmonary disease with acute lower respiratory infection: Secondary | ICD-10-CM | POA: Diagnosis not present

## 2023-11-21 DIAGNOSIS — C50919 Malignant neoplasm of unspecified site of unspecified female breast: Secondary | ICD-10-CM | POA: Diagnosis not present

## 2023-11-21 DIAGNOSIS — I5032 Chronic diastolic (congestive) heart failure: Secondary | ICD-10-CM | POA: Diagnosis not present

## 2023-11-21 DIAGNOSIS — E785 Hyperlipidemia, unspecified: Secondary | ICD-10-CM | POA: Diagnosis not present

## 2023-11-21 DIAGNOSIS — F419 Anxiety disorder, unspecified: Secondary | ICD-10-CM | POA: Diagnosis not present

## 2023-11-21 DIAGNOSIS — J121 Respiratory syncytial virus pneumonia: Secondary | ICD-10-CM | POA: Diagnosis not present

## 2023-11-21 DIAGNOSIS — C3411 Malignant neoplasm of upper lobe, right bronchus or lung: Secondary | ICD-10-CM | POA: Diagnosis not present

## 2023-11-21 DIAGNOSIS — H548 Legal blindness, as defined in USA: Secondary | ICD-10-CM | POA: Diagnosis not present

## 2023-11-21 DIAGNOSIS — K5790 Diverticulosis of intestine, part unspecified, without perforation or abscess without bleeding: Secondary | ICD-10-CM | POA: Diagnosis not present

## 2023-11-21 DIAGNOSIS — I2510 Atherosclerotic heart disease of native coronary artery without angina pectoris: Secondary | ICD-10-CM | POA: Diagnosis not present

## 2023-11-21 DIAGNOSIS — J219 Acute bronchiolitis, unspecified: Secondary | ICD-10-CM | POA: Diagnosis not present

## 2023-11-21 DIAGNOSIS — Z170 Estrogen receptor positive status [ER+]: Secondary | ICD-10-CM

## 2023-11-21 DIAGNOSIS — G8929 Other chronic pain: Secondary | ICD-10-CM | POA: Diagnosis not present

## 2023-11-21 DIAGNOSIS — J9611 Chronic respiratory failure with hypoxia: Secondary | ICD-10-CM | POA: Diagnosis not present

## 2023-11-21 DIAGNOSIS — E039 Hypothyroidism, unspecified: Secondary | ICD-10-CM | POA: Diagnosis not present

## 2023-11-21 DIAGNOSIS — M1990 Unspecified osteoarthritis, unspecified site: Secondary | ICD-10-CM | POA: Diagnosis not present

## 2023-11-21 DIAGNOSIS — I110 Hypertensive heart disease with heart failure: Secondary | ICD-10-CM | POA: Diagnosis not present

## 2023-11-22 ENCOUNTER — Inpatient Hospital Stay: Attending: Hematology

## 2023-11-22 ENCOUNTER — Inpatient Hospital Stay: Admitting: Hematology

## 2023-11-22 VITALS — BP 119/63 | HR 63 | Temp 97.9°F | Resp 16 | Ht 63.0 in | Wt 132.2 lb

## 2023-11-22 DIAGNOSIS — Z170 Estrogen receptor positive status [ER+]: Secondary | ICD-10-CM | POA: Insufficient documentation

## 2023-11-22 DIAGNOSIS — Z79810 Long term (current) use of selective estrogen receptor modulators (SERMs): Secondary | ICD-10-CM | POA: Insufficient documentation

## 2023-11-22 DIAGNOSIS — R918 Other nonspecific abnormal finding of lung field: Secondary | ICD-10-CM

## 2023-11-22 DIAGNOSIS — C50411 Malignant neoplasm of upper-outer quadrant of right female breast: Secondary | ICD-10-CM

## 2023-11-22 DIAGNOSIS — Z9049 Acquired absence of other specified parts of digestive tract: Secondary | ICD-10-CM | POA: Diagnosis not present

## 2023-11-22 DIAGNOSIS — Z85038 Personal history of other malignant neoplasm of large intestine: Secondary | ICD-10-CM | POA: Insufficient documentation

## 2023-11-22 DIAGNOSIS — J189 Pneumonia, unspecified organism: Secondary | ICD-10-CM | POA: Insufficient documentation

## 2023-11-22 DIAGNOSIS — C182 Malignant neoplasm of ascending colon: Secondary | ICD-10-CM | POA: Diagnosis not present

## 2023-11-22 LAB — CMP (CANCER CENTER ONLY)
ALT: 8 U/L (ref 0–44)
AST: 18 U/L (ref 15–41)
Albumin: 3.6 g/dL (ref 3.5–5.0)
Alkaline Phosphatase: 41 U/L (ref 38–126)
Anion gap: 5 (ref 5–15)
BUN: 24 mg/dL — ABNORMAL HIGH (ref 8–23)
CO2: 29 mmol/L (ref 22–32)
Calcium: 9 mg/dL (ref 8.9–10.3)
Chloride: 104 mmol/L (ref 98–111)
Creatinine: 0.75 mg/dL (ref 0.44–1.00)
GFR, Estimated: >60 mL/min (ref >60)
Glucose, Bld: 98 mg/dL (ref 70–99)
Potassium: 4.4 mmol/L (ref 3.5–5.1)
Sodium: 138 mmol/L (ref 135–145)
Total Bilirubin: 0.5 mg/dL (ref 0.0–1.2)
Total Protein: 6.7 g/dL (ref 6.5–8.1)

## 2023-11-22 LAB — CBC WITH DIFFERENTIAL (CANCER CENTER ONLY)
Abs Immature Granulocytes: 0.02 10*3/uL (ref 0.00–0.07)
Basophils Absolute: 0 10*3/uL (ref 0.0–0.1)
Basophils Relative: 1 %
Eosinophils Absolute: 0.2 10*3/uL (ref 0.0–0.5)
Eosinophils Relative: 3 %
HCT: 30.1 % — ABNORMAL LOW (ref 36.0–46.0)
Hemoglobin: 10.1 g/dL — ABNORMAL LOW (ref 12.0–15.0)
Immature Granulocytes: 0 %
Lymphocytes Relative: 17 %
Lymphs Abs: 0.9 10*3/uL (ref 0.7–4.0)
MCH: 31.6 pg (ref 26.0–34.0)
MCHC: 33.6 g/dL (ref 30.0–36.0)
MCV: 94.1 fL (ref 80.0–100.0)
Monocytes Absolute: 0.7 10*3/uL (ref 0.1–1.0)
Monocytes Relative: 13 %
Neutro Abs: 3.5 10*3/uL (ref 1.7–7.7)
Neutrophils Relative %: 66 %
Platelet Count: 221 10*3/uL (ref 150–400)
RBC: 3.2 MIL/uL — ABNORMAL LOW (ref 3.87–5.11)
RDW: 15.2 % (ref 11.5–15.5)
WBC Count: 5.4 10*3/uL (ref 4.0–10.5)
nRBC: 0 % (ref 0.0–0.2)

## 2023-11-22 NOTE — Assessment & Plan Note (Signed)
-  pT2N0M0, stage I  -She was diagnosed in 09/2019 by colonoscopy with Dr Silverio Decamp. -She underwent right colectomy on 12/15/19 with Dr. Marlou Starks. 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 Negative margins and LNs.  -restaging PET 06/13/21 was NED

## 2023-11-22 NOTE — Progress Notes (Signed)
 [facility]   [phone] [phone]   Clinic Follow up Note   Patient Care Team: Laurann Montana, MD as PCP - General (Family Medicine) Chilton Si, MD as PCP - Cardiology (Cardiology) Donnelly Angelica, RN as Oncology Nurse Navigator Pershing Proud, RN as Oncology Nurse Navigator Griselda Miner, MD as Consulting Physician (General Surgery) Malachy Mood, MD as Consulting Physician (Hematology) Lonie Peak, MD as Attending Physician (Radiation Oncology)  Date of Service:  11/22/2023  CHIEF COMPLAINT: f/u of lung mass  CURRENT THERAPY:  Cancer surveillance  Oncology History   Cancer of right colon (HCC) -pT2N0M0, stage I  -She was diagnosed in 09/2019 by colonoscopy with Dr Lavon Paganini. -She underwent right colectomy on 12/15/19 with Dr. Carolynne Edouard. 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 Negative margins and LNs.  -restaging PET 06/13/21 was NED  Malignant neoplasm of upper-outer quadrant of right breast in female, estrogen receptor positive (HCC) pT1cN2aMx, ER/PR+, HER2-, Grade II -s/p right breast lumpectomy by Dr Carolynne Edouard on 06/19/19, path showed 2 cm mucinous carcinoma and three positive lymph nodes (two with ECE) plus one with micrometastasis. Margins negative. -she received adjuvant RT with Dr. Basilio Cairo 08/05/19-09/03/19.  -She started Tamoxifen in 03/2019 and held 05/2019-07/2019 for surgery.  -most recent MM 04/2023 was benign -From a breast cancer standpoint, she is stable.  -Continue Tamoxifen and surveillance with yearly mammograms  Assessment and Plan    Breast cancer Follow-up for breast cancer with no current symptoms. Mammogram is due in September. - Schedule mammogram in September.  Lung nodule 3 cm ill-defined opacity in the right anterior lung on February 2025 CT scan, unchanged since 2023. Differential includes malignancy versus inflammation or infection, considering  recent RSV and pneumonia. Surgery is not preferred due to advanced age and prior radiation. PET scan planned for further evaluation, with informed consent obtained. Discussed cost and insurance coverage issues of PET scans. - Order PET scan to evaluate lung nodule. - Contact her with PET scan results. - If PET scan is positive, consult with Dr. Basilio Cairo.  Pneumonia Recovering from recent pneumonia hospitalization. Currently on nebulizer treatment twice daily with improved breathing. Oxygen use is now only at night. Discussed transitioning back to inhaler if improvement continues. - Continue nebulizer treatment as needed. - Consider transitioning back to inhaler if breathing continues to improve.     Plan -I personally reviewed her CT scan from October 08, 2023, which showed a 3.0 x 2.8 cm mass in the right upper lobe, enlarged from last scan.  Infection versus cancer recurrence -Will order PET scan for further evaluation, I will call her son after the PET scan.    SUMMARY OF ONCOLOGIC HISTORY: Oncology History Overview Note  Cancer Staging Cancer of right colon [facility]) Staging form: Colon and Rectum, AJCC 8th Edition - Pathologic stage from 12/15/2019: Stage I (pT2, pN0, cM0) - Signed by Malachy Mood, MD on 01/08/2020  Malignant neoplasm of upper-outer quadrant of right breast in female, estrogen receptor positive (HCC) Staging form: Breast, AJCC 8th Edition - Clinical: Stage IIA (cT1c, cN1, cM0, G3, ER+, PR+, HER2-) - Signed by Malachy Mood, MD on 04/09/2019 - Pathologic stage from 06/19/2019: Stage IB (pT1c, pN2a, cM0, G2, ER+, PR+, HER2-) - Signed by Malachy Mood, MD on 07/03/2019    Malignant neoplasm of upper-outer quadrant of right breast in female, estrogen receptor positive (HCC)  03/19/2019 Mammogram   Diagnostic Mammogram 03/19/19  IMPRESSION The 2 cm distortion in the  right breast 9:30 position middle depth 3cm from the nipple is indeterminate. [place] recommended.  There is also a  1.2cmx3.2cm enlarged right axillary LN highly suggestive of malignancy.     03/31/2019 Initial Biopsy   Diagnosis 03/31/19  1. Breast, right, needle core biopsy, 9:30 o'clock, mid depth, 3cmfn - INVASIVE DUCTAL CARCINOMA. - LYMPHOVASCULAR INVASION IS IDENTIFIED. - SEE COMMENT. 2. Lymph node, needle/core biopsy, right axilla - INVASIVE DUCTAL CARCINOMA. - SEE COMMENT.   03/31/2019 Receptors her2   1. PROGNOSTIC INDICATORS Results: IMMUNOHISTOCHEMICAL AND MORPHOMETRIC ANALYSIS PERFORMED MANUALLY The tumor cells are NEGATIVE for Her2 (0). Estrogen Receptor: 100%, POSITIVE, STRONG STAINING INTENSITY Progesterone Receptor: 10%, POSITIVE, STRONG STAINING INTENSITY Proliferation Marker Ki67: 10%  2. PROGNOSTIC INDICATORS Results: IMMUNOHISTOCHEMICAL AND MORPHOMETRIC ANALYSIS PERFORMED MANUALLY Estrogen Receptor: 100%, POSITIVE, STRONG STAINING INTENSITY Progesterone Receptor: 0%, NEGATIVE Proliferation Marker Ki67: 40%   04/03/2019 Initial Diagnosis   Malignant neoplasm of upper-outer quadrant of right breast in female, estrogen receptor positive (HCC)   04/09/2019 Cancer Staging   Staging form: Breast, AJCC 8th Edition - Clinical: Stage IIA (cT1c, cN1, cM0, G3, ER+, PR+, HER2-) - Signed by Malachy Mood, MD on 04/09/2019  Staging form: Breast, AJCC 8th Edition - Clinical: Stage IIA (cT1c, cN1, cM0, G3, ER+, PR+, HER2-) - Signed by Malachy Mood, MD on 04/09/2019   03/2019 -  Anti-estrogen oral therapy   Tamoxifen 20mg  daily starting 03/2019. Held 05/30/19 -07/2019 for surgery.   06/19/2019 Surgery   RIGHT BREAST LUMPECTOMY WITH RADIOACTIVE SEED AND SENTINEL LYMPH NODE BIOPSY by Dr Carolynne Edouard  06/19/19    06/19/2019 Pathology Results   FINAL MICROSCOPIC DIAGNOSIS: 06/19/19  A. BREAST, RIGHT, LUMPECTOMY:  -  Mucinous carcinoma, Nottingham grade 2  -  Margins uninvolved by carcinoma (0.2 cm; medial margin)  -  Lymphovascular space invasion present  -  Previous biopsy site changes present  -  See  oncology table and comment below   B. LYMPH NODE, RIGHT AXILLARY #1, SENTINEL, BIOPSY:  -  Metastatic carcinoma involving two lymph nodes (2/2)  -  Extracapsular extension present  -  See comment   C. BREAST, RIGHT MEDIAL MARGIN, EXCISION:  -  Residual invasive carcinoma  -  Usual ductal hyperplasia with calcifications  -  See comment   D. LYMPH NODE, RIGHT AXILLARY #2, SENTINEL, BIOPSY:  -  Metastatic carcinoma involving one lymph node (1/1)   E. LYMPH NODE, RIGHT AXILLARY #1 , BIOPSY:  -  Metastatic carcinoma involving one lymph node (1/1)   F. LYMPH NODE, RIGHT AXILLARY #2, BIOPSY:  -  No carcinoma identified in one lymph node (0/1)    06/19/2019 Cancer Staging   Staging form: Breast, AJCC 8th Edition - Pathologic stage from 06/19/2019: Stage IB (pT1c, pN2a, cM0, G2, ER+, PR+, HER2-) - Signed by Malachy Mood, MD on 07/03/2019   07/22/2019 PET scan   IMPRESSION: 1. Low level hypermetabolism identified in the surgical beds of the right breast and right axilla. This is presumably related to the surgery. 2. Tiny right subpectoral lymph nodes are asymmetric in concerning for metastatic disease on CT imaging. No hypermetabolism on the PET study although size of these lymph nodes is below reliable resolution on PET imaging. 3. 10 mm short axis precarinal lymph node shows low level hypermetabolism. Metastatic involvement not excluded. 4. 11 mm ground-glass nodule in right upper lobe shows low level FDG accumulation. Well differentiated or low-grade neoplasm is a distinct concern. Close follow-up recommended. 5. Focal hypermetabolism in the right colon, potentially related  to the ileocecal valve but incompletely characterized. Adenoma or carcinoma could have this appearance. Correlation with colorectal cancer screening history recommended. 6. Focal hypermetabolism identified in the region of the left pubic bone and adjacent sigmoid colon. No underlying bony or colonic lesion  evident. Metastatic involvement of the left pubic bone not excluded. 7.  Aortic Atherosclerois (ICD10-170.0)     08/05/2019 - 09/03/2019 Radiation Therapy   Adjuvant RT with Dr Basilio Cairo 08/05/19-09/03/19.    06/13/2021 PET scan   IMPRESSION: 1. Sub solid right upper lobe pulmonary nodule shows low level hypermetabolism. Neoplasm not excluded. 2. 11 mm ground-glass nodule superior segment left lower lobe shows very low level FDG accumulation. Findings may be infectious/inflammatory. As well differentiated in low-grade neoplasm can be poorly FDG avid, continued close attention recommended. 3. No evidence for hypermetabolic metastatic disease in the abdomen or pelvis. 4.  Aortic Atherosclerois (ICD10-170.0) 5.  Emphysema. ([id number])   Cancer of right colon (HCC)  10/15/2019 Procedure   Colonoscopy by Dr Lavon Paganini  IMPRESSION - Likely malignant tumor in the proximal ascending colon. Biopsied. Tattooed. - Severe diverticulosis in the sigmoid colon, in the descending colon, in the transverse colon and in the ascending colon. - Non-bleeding internal hemorrhoids.   10/15/2019 Initial Biopsy   Diagnosis Ascending Colon Polyp, mass - ADENOCARCINOMA. Microscopic Comment IHC for MMR will be reported separately. Results reported to Dr. Marsa Aris on 10/16/2019. Intradepartmental consultation (Dr. Kenard Gower).   12/15/2019 Initial Diagnosis   Cancer of right colon (HCC)   12/15/2019 Cancer Staging   Staging form: Colon and Rectum, AJCC 8th Edition - Pathologic stage from 12/15/2019: Stage I (pT2, pN0, cM0) - Signed by Malachy Mood, MD on 01/08/2020   12/15/2019 Surgery   LAPAROSCOPIC ASSISTED RIGHT COLECTOMY by Dr Carolynne Edouard and Dr Maisie Fus   12/15/2019 Pathology Results   FINAL MICROSCOPIC DIAGNOSIS:   A. COLON, TERMINAL ILEUM AND RIGHT, COLECTOMY:  - Invasive moderately differentiated adenocarcinoma, 4.6 cm, involving  ascending colon  - Carcinoma invades into deeper muscularis propria but  not beyond  - Resection margins are negative for carcinoma  - Negative for lymphovascular or perineural invasion  - Unremarkable appendix  - Nineteen lymph nodes, negative for carcinoma (0/19)  - See oncology table    12/15/2019 Genetic Testing   ADDENDUM:   Mismatch Repair Protein (IHC)  SUMMARY INTERPRETATION: ABNORMAL  There is loss of the major and minor MMR proteins MLH1 and PMS2. The  loss of expression may be secondary to promoter hyper-methylation, gene  mutation or other genetic event. BRAF mutation testing and/or MLH1  methylation testing is indicated. The presence of a BRAF mutation and/or  MLH1 hypermethylation is indicative of a sporadic-type tumor. The  absence of either BRAF mutation and/or presence of normal methylation  indicate the possible presence of a hereditary germline mutation (e.g.  Lynch syndrome) and referral to genetic counseling is warranted. It is  recommended that the loss of protein expression be correlated with  molecular based MSI testing.   IHC EXPRESSION RESULTS  TEST           RESULT  MLH1:          LOSS OF NUCLEAR EXPRESSION  MSH2:          Preserved nuclear expression  MSH6:          Preserved nuclear expression  PMS2:          LOSS OF NUCLEAR EXPRESSION    12/29/2019 Imaging   CT AP W contrast  IMPRESSION: 1.  Sigmoid diverticulosis without inflammation. Status post right colectomy. 2. Multiple small calcified uterine fibroids. 3. No acute abnormality seen in the abdomen or pelvis.   Aortic Atherosclerosis (ICD10-I70.0).     01/28/2020 Imaging   CT AP w contrast  IMPRESSION: 1. Moderate irregular wall thickening of the right colon with some areas of low attenuation. Findings could be due to cecal diverticulitis or other inflammatory or infectious colitis. I doubt that this represents recurrent colon cancer. 2. Status post partial right colectomy. No findings for locoregional adenopathy or metastatic disease. 3. Stable advanced  atherosclerotic calcifications involving the aorta and branch vessels. 4. Stable benign-appearing central hepatic cysts. 5. Stable calcified uterine fibroids. 6. Aortic atherosclerosis.   Aortic Atherosclerosis (ICD10-I70.0).   04/30/2020 Imaging   CT Chest  IMPRESSION: 1. Ground-glass nodule in the RIGHT upper lobe measuring 1.4 x 0.9 cm previously 1.4 x 0.9 cm. 2. Ground-glass nodule in the LEFT upper lobe measuring 11 x 12 mm, spiculated margins and mild fissural distortion, accompanying features of this nodule. Findings are concerning for for multifocal bronchogenic neoplasm given persistence. 3. Stable size of small nodes along the RIGHT paratracheal chain largest approximately 9 mm. 4. Stable hepatic hemangioma. 5. Dilation of the ascending aorta to 3.7 cm, similar to previous exams. 6. Three-vessel coronary artery disease. 7. Aortic atherosclerosis.   Aortic Atherosclerosis (ICD10-I70.0).     02/01/2021 Imaging   CT CAP  IMPRESSION: No evidence of recurrent or metastatic carcinoma within the abdomen or pelvis. No evidence of thoracic metastatic disease.   Stable hepatic hemangioma.   Stable small calcified uterine fibroids.   Colonic diverticulosis. No radiographic evidence of diverticulitis.   Sub-solid pulmonary nodule in anterior right upper lobe shows increased solid component, suspicious for primary lung adenocarcinoma. PET-CT is recommended for further evaluation.   Stable 11 mm ground-glass nodule in superior segment of left lower lobe. Recommend continued attention on follow-up imaging, and this could also be assessed by PET-CT.   Aortic Atherosclerosis (ICD10-I70.0) and Emphysema (ICD10-J43.9).     06/13/2021 PET scan   IMPRESSION: 1. Sub solid right upper lobe pulmonary nodule shows low level hypermetabolism. Neoplasm not excluded. 2. 11 mm ground-glass nodule superior segment left lower lobe shows very low level FDG accumulation. Findings may  be infectious/inflammatory. As well differentiated in low-grade neoplasm can be poorly FDG avid, continued close attention recommended. 3. No evidence for hypermetabolic metastatic disease in the abdomen or pelvis. 4.  Aortic Atherosclerois (ICD10-170.0) 5.  Emphysema. (ICD10-J43.9)   10/17/2022 Imaging    IMPRESSION: 1. Progressive enlargement and spiculation of the previously demonstrated right upper lobe nodule, highly suspicious for malignancy, likely primary bronchogenic carcinoma. 2. Additional ground-glass and part solid nodules in the lingula and superior segment of the left lower lobe are grossly stable from previous CT of 2022. Continued follow-up recommended. 3. No typical evidence of metastatic disease. 4. Stable postsurgical changes in the right breast and right axilla. 5. Aortic Atherosclerosis (ICD10-I70.0) and Emphysema (ICD10-J43.9).        Discussed the use of AI scribe software for clinical note transcription with the patient, who gave verbal consent to proceed.  History of Present Illness   The patient, a 89 year old female with a history of breast cancer, presents for follow-up after recent hospitalization for RSV and pneumonia. She reports feeling better overall, with no cough, but is experiencing pain in her right lung. She believes this pain is due to cancer, based on a recent conversation with her primary care physician and a review  of her MyChart. She has been receiving nebulizer treatments twice daily since her hospitalization. The patient also has a history of multiple lung nodules, which were treated with radiation. She has not seen her radiation oncologist, Dr. Basilio Cairo, recently due to both her and her caregiver contracting COVID-19. The patient also mentions that she walks daily and is due for a mammogram in September.         All other systems were reviewed with the patient and are negative.  MEDICAL HISTORY:  Past Medical History:  Diagnosis Date    Anxiety    Arthritis    Breast cancer (HCC)    CAD (coronary artery disease)    a. 03/2017: 95% LCx stenosis --> PCI/DES placed   Cataract    Chest pain 03/27/2017   Chronic diastolic heart failure (HCC) 06/25/2017   Chronic lower back pain    Colon cancer (HCC) 11/2019   s/p colectomy   COPD (chronic obstructive pulmonary disease) (HCC)    Coronary artery disease    Diverticulitis    Diverticulosis    GERD (gastroesophageal reflux disease)    Glaucoma    Heart failure, type unknown (HCC) 03/11/2017   History of radiation therapy 08/05/19- 09/03/19   Right Breast/ SCV 16 fractions of 2.66 Gy each for a total of 42.56 Gy. Right breast boost 4 fractions of 2 Gy each to total 8 Gy.    Hyperlipidemia 03/11/2017   Hypertension    Hypothyroidism    Macular degeneration    wet and dry per pt's son   Personal history of chemotherapy    Personal history of radiation therapy    Pneumonia X 1   "years and years ago" (03/26/2017)   Shortness of breath 03/11/2017   Status post dilation of esophageal narrowing     SURGICAL HISTORY: Past Surgical History:  Procedure Laterality Date   BILATERAL OOPHORECTOMY Bilateral    BREAST LUMPECTOMY Right 06/19/2019   BREAST LUMPECTOMY WITH RADIOACTIVE SEED AND SENTINEL LYMPH NODE BIOPSY Right 06/19/2019   Procedure: RIGHT BREAST LUMPECTOMY WITH RADIOACTIVE SEED AND SENTINEL LYMPH NODE BIOPSY;  Surgeon: Griselda Miner, MD;  Location: [facility] OR;  Service: General;  Laterality: Right;   CATARACT EXTRACTION     CATARACT EXTRACTION W/ INTRAOCULAR LENS  IMPLANT, BILATERAL Bilateral    COLOSTOMY     Greater than 10 yrs   CORONARY ANGIOPLASTY WITH STENT PLACEMENT  03/26/2017   LAPAROSCOPIC RIGHT COLECTOMY Right 12/15/2019   Procedure: LAPAROSCOPIC ASSISTED RIGHT COLECTOMY;  Surgeon: Griselda Miner, MD;  Location: [facility] OR;  Service: General;  Laterality: Right;   LEFT HEART CATH AND CORONARY ANGIOGRAPHY N/A 03/26/2017   Procedure: Left Heart Cath and Coronary Angiography;   Surgeon: Lyn Records, MD;  Location: [facility] INVASIVE CV LAB;  Service: Cardiovascular;  Laterality: N/A;   NERVE SURGERY     "lump on my sciatic nerve was removed years ago"   SHOULDER OPEN ROTATOR CUFF REPAIR Right    THYROIDECTOMY, PARTIAL     TONSILLECTOMY     UPPER GASTROINTESTINAL ENDOSCOPY     > ten yrs    I have reviewed the social history and family history with the patient and they are unchanged from previous note.  ALLERGIES:  is allergic to lipitor [atorvastatin].  MEDICATIONS:  Current Outpatient Medications  Medication Sig Dispense Refill   albuterol (PROVENTIL HFA;VENTOLIN HFA) 108 (90 Base) MCG/ACT inhaler Inhale 2 puffs into the lungs every 6 (six) hours as needed for wheezing or shortness of breath.  amLODipine (NORVASC) 5 MG tablet Take 1 tablet (5 mg total) by mouth daily. 30 tablet 1   arformoterol (BROVANA) 15 MCG/2ML NEBU Take 2 mLs (15 mcg total) by nebulization 2 (two) times daily. 120 mL 0   brimonidine-timolol (COMBIGAN) 0.2-0.5 % ophthalmic solution Apply to eye.     budesonide (PULMICORT) 0.25 MG/2ML nebulizer solution Take 2 mLs (0.25 mg total) by nebulization 2 (two) times daily. 60 mL 12   Calcium Carb-Cholecalciferol (CALCIUM + D3) 600-200 MG-UNIT TABS Take 1 tablet by mouth daily.     Cholecalciferol (VITAMIN D3) 2000 units capsule Take 4,000 Units by mouth daily.     Coenzyme Q10 (COQ10) 100 MG CAPS Take 100 mg by mouth daily.     diclofenac (CATAFLAM) 50 MG tablet Take 1 tablet (50 mg total) by mouth daily after breakfast. 30 tablet 3   famotidine (PEPCID) 20 MG tablet Take 20 mg by mouth daily.     gabapentin (NEURONTIN) 100 MG capsule Take 1 capsule (100 mg total) by mouth at bedtime. 30 capsule 3   guaiFENesin (MUCINEX) 600 MG 12 hr tablet Take 2 tablets (1,200 mg total) by mouth 2 (two) times daily. 30 tablet 0   ipratropium-albuterol (DUONEB) 0.5-2.5 (3) MG/3ML SOLN Take 3 mLs by nebulization every 6 (six) hours as needed. 360 mL 0    latanoprost (XALATAN) 0.005 % ophthalmic solution Place 1 drop into both eyes at bedtime.     levothyroxine (SYNTHROID, LEVOTHROID) 100 MCG tablet Take 100 mcg by mouth daily before breakfast.   0   LORazepam (ATIVAN) 0.5 MG tablet Take 0.25-0.5 mg by mouth every 8 (eight) hours as needed for anxiety.     LUTEIN PO Take 1 tablet by mouth daily.     Multiple Vitamin (MULTIVITAMIN) tablet Take 1 tablet by mouth daily.     nitroGLYCERIN (NITROSTAT) 0.4 MG SL tablet Place 1 tablet (0.4 mg total) under the tongue every 5 (five) minutes as needed for chest pain. 25 tablet 3   Omega-3 Fatty Acids (FISH OIL PO) Take 1 capsule by mouth daily.     pravastatin (PRAVACHOL) 40 MG tablet TAKE 1 TABLET BY MOUTH DAILY GENERIC EQUIVALENT FOR PRAVACHOL 90 tablet 0   solifenacin (VESICARE) 5 MG tablet Take 5 mg by mouth daily.     tamoxifen (NOLVADEX) 20 MG tablet TAKE 1 TABLET BY MOUTH ONCE DAILY. APPOINTMENT REQUIRED FOR FUTURE REFILLS 90 tablet 3   traMADol (ULTRAM) 50 MG tablet Take 0.5-1 tablets (25-50 mg total) by mouth every 8 (eight) hours as needed. 30 tablet 0   No current facility-administered medications for this visit.    PHYSICAL EXAMINATION: ECOG PERFORMANCE STATUS: 2 - Symptomatic, <50% confined to bed  Vitals:   11/22/23 1103  BP: 119/63  Pulse: 63  Resp: 16  Temp: 97.9 F (36.6 C)  SpO2: 96%   Wt Readings from Last 3 Encounters:  11/22/23 132 lb 3.2 oz (60 kg)  10/14/23 119 lb 0.8 oz (54 kg)  04/17/23 132 lb 12.8 oz (60.2 kg)     GENERAL:alert, no distress and comfortable SKIN: skin color, texture, turgor are normal, no rashes or significant lesions EYES: normal, Conjunctiva are pink and non-injected, sclera clear NECK: supple, thyroid normal size, non-tender, without nodularity LYMPH:  no palpable lymphadenopathy in the cervical, axillary  LUNGS: clear to auscultation and percussion with normal breathing effort HEART: regular rate & rhythm and no murmurs and no lower extremity  edema ABDOMEN:abdomen soft, non-tender and normal bowel sounds Musculoskeletal:no cyanosis  of digits and no clubbing  NEURO: alert & oriented x 3 with fluent speech, no focal motor/sensory deficits   LABORATORY DATA:  I have reviewed the data as listed    Latest Ref Rng & Units 11/22/2023   10:50 AM 10/11/2023    9:26 AM 10/09/2023    6:58 AM  CBC  WBC 4.0 - 10.5 K/uL 5.4  7.5  10.0   Hemoglobin 12.0 - 15.0 g/dL 29.5  62.1  30.8   Hematocrit 36.0 - 46.0 % 30.1  30.9  33.7   Platelets 150 - 400 K/uL 221  179  183         Latest Ref Rng & Units 11/22/2023   10:50 AM 10/12/2023    9:26 AM 10/11/2023    9:26 AM  CMP  Glucose 70 - 99 mg/dL 98  657  846   BUN 8 - 23 mg/dL 24  10  15    Creatinine 0.44 - 1.00 mg/dL 9.62  9.52  8.41   Sodium 135 - 145 mmol/L 138  132  132   Potassium 3.5 - 5.1 mmol/L 4.4  4.5  3.4   Chloride 98 - 111 mmol/L 104  103  100   CO2 22 - 32 mmol/L 29  21  21    Calcium 8.9 - 10.3 mg/dL 9.0  8.1  7.9   Total Protein 6.5 - 8.1 g/dL 6.7     Total Bilirubin 0.0 - 1.2 mg/dL 0.5     Alkaline Phos 38 - 126 U/L 41     AST 15 - 41 U/L 18     ALT 0 - 44 U/L 8         RADIOGRAPHIC STUDIES: I have personally reviewed the radiological images as listed and agreed with the findings in the report. No results found.    Orders Placed This Encounter  Procedures   NM PET Image Restag (PS) Skull Base To Thigh    Standing Status:   Future    Expected Date:   12/06/2023    Expiration Date:   11/21/2024    If indicated for the ordered procedure, I authorize the administration of a radiopharmaceutical per Radiology protocol:   Yes    Preferred imaging location?:   [facility]   All questions were answered. The patient knows to call the clinic with any problems, questions or concerns. No barriers to learning was detected. The total time spent in the appointment was 30 minutes.     Malachy Mood, MD 11/22/2023

## 2023-11-22 NOTE — Assessment & Plan Note (Signed)
 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 Margins negative. -she received adjuvant RT with Dr. Basilio Cairo 08/05/19-09/03/19.  -She started Tamoxifen in 03/2019 and held 05/2019-07/2019 for surgery.  -most recent MM 04/2023 was benign -From a breast cancer standpoint, she is stable.  -Continue Tamoxifen and surveillance with yearly mammograms

## 2023-11-26 DIAGNOSIS — J449 Chronic obstructive pulmonary disease, unspecified: Secondary | ICD-10-CM | POA: Diagnosis not present

## 2023-11-26 DIAGNOSIS — N390 Urinary tract infection, site not specified: Secondary | ICD-10-CM | POA: Diagnosis not present

## 2023-11-27 DIAGNOSIS — I5032 Chronic diastolic (congestive) heart failure: Secondary | ICD-10-CM | POA: Diagnosis not present

## 2023-11-27 DIAGNOSIS — E785 Hyperlipidemia, unspecified: Secondary | ICD-10-CM | POA: Diagnosis not present

## 2023-11-27 DIAGNOSIS — G8929 Other chronic pain: Secondary | ICD-10-CM | POA: Diagnosis not present

## 2023-11-27 DIAGNOSIS — F419 Anxiety disorder, unspecified: Secondary | ICD-10-CM | POA: Diagnosis not present

## 2023-11-27 DIAGNOSIS — K5790 Diverticulosis of intestine, part unspecified, without perforation or abscess without bleeding: Secondary | ICD-10-CM | POA: Diagnosis not present

## 2023-11-27 DIAGNOSIS — I2510 Atherosclerotic heart disease of native coronary artery without angina pectoris: Secondary | ICD-10-CM | POA: Diagnosis not present

## 2023-11-27 DIAGNOSIS — J219 Acute bronchiolitis, unspecified: Secondary | ICD-10-CM | POA: Diagnosis not present

## 2023-11-27 DIAGNOSIS — J9611 Chronic respiratory failure with hypoxia: Secondary | ICD-10-CM | POA: Diagnosis not present

## 2023-11-27 DIAGNOSIS — H548 Legal blindness, as defined in USA: Secondary | ICD-10-CM | POA: Diagnosis not present

## 2023-11-27 DIAGNOSIS — J121 Respiratory syncytial virus pneumonia: Secondary | ICD-10-CM | POA: Diagnosis not present

## 2023-11-27 DIAGNOSIS — I110 Hypertensive heart disease with heart failure: Secondary | ICD-10-CM | POA: Diagnosis not present

## 2023-11-27 DIAGNOSIS — C3411 Malignant neoplasm of upper lobe, right bronchus or lung: Secondary | ICD-10-CM | POA: Diagnosis not present

## 2023-11-27 DIAGNOSIS — C50919 Malignant neoplasm of unspecified site of unspecified female breast: Secondary | ICD-10-CM | POA: Diagnosis not present

## 2023-11-27 DIAGNOSIS — J440 Chronic obstructive pulmonary disease with acute lower respiratory infection: Secondary | ICD-10-CM | POA: Diagnosis not present

## 2023-11-27 DIAGNOSIS — E039 Hypothyroidism, unspecified: Secondary | ICD-10-CM | POA: Diagnosis not present

## 2023-11-27 DIAGNOSIS — M1990 Unspecified osteoarthritis, unspecified site: Secondary | ICD-10-CM | POA: Diagnosis not present

## 2023-12-04 DIAGNOSIS — H548 Legal blindness, as defined in USA: Secondary | ICD-10-CM | POA: Diagnosis not present

## 2023-12-04 DIAGNOSIS — I110 Hypertensive heart disease with heart failure: Secondary | ICD-10-CM | POA: Diagnosis not present

## 2023-12-04 DIAGNOSIS — I5032 Chronic diastolic (congestive) heart failure: Secondary | ICD-10-CM | POA: Diagnosis not present

## 2023-12-04 DIAGNOSIS — E785 Hyperlipidemia, unspecified: Secondary | ICD-10-CM | POA: Diagnosis not present

## 2023-12-04 DIAGNOSIS — K5790 Diverticulosis of intestine, part unspecified, without perforation or abscess without bleeding: Secondary | ICD-10-CM | POA: Diagnosis not present

## 2023-12-04 DIAGNOSIS — J219 Acute bronchiolitis, unspecified: Secondary | ICD-10-CM | POA: Diagnosis not present

## 2023-12-04 DIAGNOSIS — J440 Chronic obstructive pulmonary disease with acute lower respiratory infection: Secondary | ICD-10-CM | POA: Diagnosis not present

## 2023-12-04 DIAGNOSIS — J121 Respiratory syncytial virus pneumonia: Secondary | ICD-10-CM | POA: Diagnosis not present

## 2023-12-04 DIAGNOSIS — E039 Hypothyroidism, unspecified: Secondary | ICD-10-CM | POA: Diagnosis not present

## 2023-12-04 DIAGNOSIS — M1990 Unspecified osteoarthritis, unspecified site: Secondary | ICD-10-CM | POA: Diagnosis not present

## 2023-12-04 DIAGNOSIS — J9611 Chronic respiratory failure with hypoxia: Secondary | ICD-10-CM | POA: Diagnosis not present

## 2023-12-04 DIAGNOSIS — F419 Anxiety disorder, unspecified: Secondary | ICD-10-CM | POA: Diagnosis not present

## 2023-12-04 DIAGNOSIS — G8929 Other chronic pain: Secondary | ICD-10-CM | POA: Diagnosis not present

## 2023-12-04 DIAGNOSIS — I2510 Atherosclerotic heart disease of native coronary artery without angina pectoris: Secondary | ICD-10-CM | POA: Diagnosis not present

## 2023-12-04 DIAGNOSIS — C50919 Malignant neoplasm of unspecified site of unspecified female breast: Secondary | ICD-10-CM | POA: Diagnosis not present

## 2023-12-04 DIAGNOSIS — C3411 Malignant neoplasm of upper lobe, right bronchus or lung: Secondary | ICD-10-CM | POA: Diagnosis not present

## 2023-12-06 ENCOUNTER — Encounter (HOSPITAL_COMMUNITY)
Admission: RE | Admit: 2023-12-06 | Discharge: 2023-12-06 | Disposition: A | Discharge status: Home / Self Care | Source: Ambulatory Visit | Attending: Hematology | Admitting: Hematology

## 2023-12-06 DIAGNOSIS — K5790 Diverticulosis of intestine, part unspecified, without perforation or abscess without bleeding: Secondary | ICD-10-CM | POA: Diagnosis not present

## 2023-12-06 DIAGNOSIS — E039 Hypothyroidism, unspecified: Secondary | ICD-10-CM | POA: Diagnosis not present

## 2023-12-06 DIAGNOSIS — F419 Anxiety disorder, unspecified: Secondary | ICD-10-CM | POA: Diagnosis not present

## 2023-12-06 DIAGNOSIS — I2510 Atherosclerotic heart disease of native coronary artery without angina pectoris: Secondary | ICD-10-CM | POA: Diagnosis not present

## 2023-12-06 DIAGNOSIS — C50919 Malignant neoplasm of unspecified site of unspecified female breast: Secondary | ICD-10-CM | POA: Diagnosis not present

## 2023-12-06 DIAGNOSIS — I5032 Chronic diastolic (congestive) heart failure: Secondary | ICD-10-CM | POA: Diagnosis not present

## 2023-12-06 DIAGNOSIS — J9611 Chronic respiratory failure with hypoxia: Secondary | ICD-10-CM | POA: Diagnosis not present

## 2023-12-06 DIAGNOSIS — C3411 Malignant neoplasm of upper lobe, right bronchus or lung: Secondary | ICD-10-CM | POA: Diagnosis not present

## 2023-12-06 DIAGNOSIS — R918 Other nonspecific abnormal finding of lung field: Secondary | ICD-10-CM | POA: Diagnosis not present

## 2023-12-06 DIAGNOSIS — G8929 Other chronic pain: Secondary | ICD-10-CM | POA: Diagnosis not present

## 2023-12-06 DIAGNOSIS — J121 Respiratory syncytial virus pneumonia: Secondary | ICD-10-CM | POA: Diagnosis not present

## 2023-12-06 DIAGNOSIS — M1990 Unspecified osteoarthritis, unspecified site: Secondary | ICD-10-CM | POA: Diagnosis not present

## 2023-12-06 DIAGNOSIS — I110 Hypertensive heart disease with heart failure: Secondary | ICD-10-CM | POA: Diagnosis not present

## 2023-12-06 DIAGNOSIS — J219 Acute bronchiolitis, unspecified: Secondary | ICD-10-CM | POA: Diagnosis not present

## 2023-12-06 DIAGNOSIS — H548 Legal blindness, as defined in USA: Secondary | ICD-10-CM | POA: Diagnosis not present

## 2023-12-06 DIAGNOSIS — J440 Chronic obstructive pulmonary disease with acute lower respiratory infection: Secondary | ICD-10-CM | POA: Diagnosis not present

## 2023-12-06 DIAGNOSIS — E785 Hyperlipidemia, unspecified: Secondary | ICD-10-CM | POA: Diagnosis not present

## 2023-12-06 LAB — GLUCOSE, CAPILLARY: Glucose-Capillary: 112 mg/dL — ABNORMAL HIGH (ref 70–99)

## 2023-12-06 MED ORDER — FLUDEOXYGLUCOSE F - 18 (FDG) INJECTION
6.5000 | Freq: Once | INTRAVENOUS | Status: AC
  Administered 2023-12-06: 6.5 via INTRAVENOUS

## 2023-12-10 DIAGNOSIS — C50919 Malignant neoplasm of unspecified site of unspecified female breast: Secondary | ICD-10-CM | POA: Diagnosis not present

## 2023-12-10 DIAGNOSIS — H548 Legal blindness, as defined in USA: Secondary | ICD-10-CM | POA: Diagnosis not present

## 2023-12-10 DIAGNOSIS — J9611 Chronic respiratory failure with hypoxia: Secondary | ICD-10-CM | POA: Diagnosis not present

## 2023-12-10 DIAGNOSIS — I110 Hypertensive heart disease with heart failure: Secondary | ICD-10-CM | POA: Diagnosis not present

## 2023-12-10 DIAGNOSIS — M1990 Unspecified osteoarthritis, unspecified site: Secondary | ICD-10-CM | POA: Diagnosis not present

## 2023-12-10 DIAGNOSIS — E785 Hyperlipidemia, unspecified: Secondary | ICD-10-CM | POA: Diagnosis not present

## 2023-12-10 DIAGNOSIS — I2510 Atherosclerotic heart disease of native coronary artery without angina pectoris: Secondary | ICD-10-CM | POA: Diagnosis not present

## 2023-12-10 DIAGNOSIS — E039 Hypothyroidism, unspecified: Secondary | ICD-10-CM | POA: Diagnosis not present

## 2023-12-10 DIAGNOSIS — J440 Chronic obstructive pulmonary disease with acute lower respiratory infection: Secondary | ICD-10-CM | POA: Diagnosis not present

## 2023-12-10 DIAGNOSIS — J121 Respiratory syncytial virus pneumonia: Secondary | ICD-10-CM | POA: Diagnosis not present

## 2023-12-10 DIAGNOSIS — J219 Acute bronchiolitis, unspecified: Secondary | ICD-10-CM | POA: Diagnosis not present

## 2023-12-10 DIAGNOSIS — I5032 Chronic diastolic (congestive) heart failure: Secondary | ICD-10-CM | POA: Diagnosis not present

## 2023-12-10 DIAGNOSIS — K5790 Diverticulosis of intestine, part unspecified, without perforation or abscess without bleeding: Secondary | ICD-10-CM | POA: Diagnosis not present

## 2023-12-10 DIAGNOSIS — C3411 Malignant neoplasm of upper lobe, right bronchus or lung: Secondary | ICD-10-CM | POA: Diagnosis not present

## 2023-12-10 DIAGNOSIS — G8929 Other chronic pain: Secondary | ICD-10-CM | POA: Diagnosis not present

## 2023-12-10 DIAGNOSIS — F419 Anxiety disorder, unspecified: Secondary | ICD-10-CM | POA: Diagnosis not present

## 2023-12-12 ENCOUNTER — Inpatient Hospital Stay (HOSPITAL_BASED_OUTPATIENT_CLINIC_OR_DEPARTMENT_OTHER): Admitting: Hematology

## 2023-12-12 DIAGNOSIS — Z79810 Long term (current) use of selective estrogen receptor modulators (SERMs): Secondary | ICD-10-CM | POA: Diagnosis not present

## 2023-12-12 DIAGNOSIS — Z9049 Acquired absence of other specified parts of digestive tract: Secondary | ICD-10-CM | POA: Diagnosis not present

## 2023-12-12 DIAGNOSIS — C50411 Malignant neoplasm of upper-outer quadrant of right female breast: Secondary | ICD-10-CM | POA: Diagnosis not present

## 2023-12-12 DIAGNOSIS — J189 Pneumonia, unspecified organism: Secondary | ICD-10-CM | POA: Diagnosis not present

## 2023-12-12 DIAGNOSIS — C182 Malignant neoplasm of ascending colon: Secondary | ICD-10-CM

## 2023-12-12 DIAGNOSIS — Z170 Estrogen receptor positive status [ER+]: Secondary | ICD-10-CM | POA: Diagnosis not present

## 2023-12-12 DIAGNOSIS — Z85038 Personal history of other malignant neoplasm of large intestine: Secondary | ICD-10-CM | POA: Diagnosis not present

## 2023-12-12 DIAGNOSIS — R918 Other nonspecific abnormal finding of lung field: Secondary | ICD-10-CM | POA: Diagnosis not present

## 2023-12-13 ENCOUNTER — Other Ambulatory Visit: Payer: Self-pay

## 2023-12-14 ENCOUNTER — Encounter: Payer: Self-pay | Admitting: Hematology

## 2023-12-14 NOTE — Assessment & Plan Note (Signed)
-  pT2N0M0, stage I  -She was diagnosed in 09/2019 by colonoscopy with Dr Silverio Decamp. -She underwent right colectomy on 12/15/19 with Dr. Marlou Starks. 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 Negative margins and LNs.  -restaging PET 06/13/21 was NED

## 2023-12-14 NOTE — Progress Notes (Signed)
 [facility]   Telephone:([phone]) [phone] Fax:([phone]) [phone]   Clinic Follow up Note   Patient Care Team: Victorio Grave, MD as PCP - General (Family Medicine) Maudine Sos, MD as PCP - Cardiology (Cardiology) Alane Hsu, RN as Oncology Nurse Navigator Auther Bo, RN as Oncology Nurse Navigator Caralyn Chandler, MD as Consulting Physician (General Surgery) Sonja Thermalito, MD as Consulting Physician (Hematology) Colie Dawes, MD as Attending Physician (Radiation Oncology) 12/13/2023  I connected with Marissa Caldwell on 12/14/23 at  3:40 PM EDT by telephone and verified that I am speaking with the correct person using two identifiers.   I discussed the limitations, risks, security and privacy concerns of performing an evaluation and management service by telephone and the availability of in person appointments. I also discussed with the patient that there may be a patient responsible charge related to this service. The patient expressed understanding and agreed to proceed.   Patient's location:  Home  Provider's location:  Office    CHIEF COMPLAINT: Discussed PET scan   CURRENT THERAPY: Adjuvant tamoxifen   Oncology history Cancer of right colon (HCC) -pT2N0M0, stage I  -She was diagnosed in 09/2019 by colonoscopy with Dr Leonia Raman. -She underwent right colectomy on 12/15/19 with Dr. Alethea Andes. 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 Negative margins and LNs.  -restaging PET 06/13/21 was NED  Malignant neoplasm of upper-outer quadrant of right breast in female, estrogen receptor positive (HCC) pT1cN2aMx, ER/PR+, HER2-, Grade II -s/p right breast lumpectomy by Dr Alethea Andes on 06/19/19, path showed 2 cm mucinous carcinoma and three positive lymph nodes (two with ECE) plus one with micrometastasis. Margins negative. -she received adjuvant RT with Dr. Lurena Sally 08/05/19-09/03/19.  -She started Tamoxifen  in  03/2019 and held 05/2019-07/2019 for surgery.  -most recent MM 04/2023 was benign -From a breast cancer standpoint, she is stable.  -Continue Tamoxifen  and surveillance with yearly mammograms  Assessment and Plan Assessment & Plan Lung mass, likely lung cancer  A lung nodule, present since 2020, has grown to approximately from 1cm to 3.2 x 2.8 cm and is slightly hypermetabolic on PET scan, suggesting malignancy. Given her moderate smoking history and nodule characteristics, lung cancer is suspected.  Her case was discussed in thoracic tumor board this morning.  The nodule is slow-growing with no evidence of metastasis on PET scan. An endoscopic biopsy is recommended to confirm malignancy and assess molecular testing for targeted therapy eligibility. SBRT radiation therapy offers an 80% chance of cure. Watchful waiting is also a consideration due to slow growth and her age. - Discuss endoscopic biopsy to confirm malignancy and perform molecular testing for targeted therapy eligibility. - Discuss alternative option of proceeding with SBRT radiation therapy without biopsy with curative intent  - Discuss watchful waiting due to slow nodule growth and her age. - Refer to Dr. Lurena Sally for further discussion on radiation therapy specifics, including sessions and treatment details, if she is interested   Breast cancer She previously received radiation therapy for breast cancer. Current chest wall pain is likely related to past breast surgery, not the lung nodule.  Colon cancer She has a history of colon cancer with no indication of recurrence or metastasis related to the lung nodule.  Plan  She and her family are considering treatment options and will contact next week with their decision. - Family to call next week with a decision regarding biopsy and/or radiation therapy.       SUMMARY OF ONCOLOGIC  HISTORY: Oncology History Overview Note  Cancer Staging Cancer of right colon [facility]) Staging  form: Colon and Rectum, AJCC 8th Edition - Pathologic stage from 12/15/2019: Stage I (pT2, pN0, cM0) - Signed by Sonja Thebes, MD on 01/08/2020  Malignant neoplasm of upper-outer quadrant of right breast in female, estrogen receptor positive (HCC) Staging form: Breast, AJCC 8th Edition - Clinical: Stage IIA (cT1c, cN1, cM0, G3, ER+, PR+, HER2-) - Signed by Sonja Palermo, MD on 04/09/2019 - Pathologic stage from 06/19/2019: Stage IB (pT1c, pN2a, cM0, G2, ER+, PR+, HER2-) - Signed by Sonja Humacao, MD on 07/03/2019    Malignant neoplasm of upper-outer quadrant of right breast in female, estrogen receptor positive (HCC)  03/19/2019 Mammogram   Diagnostic Mammogram 03/19/19  IMPRESSION The 2 cm distortion in the  right breast 9:30 position middle depth 3cm from the nipple is indeterminate. US  recommended.  There is also a 1.2cmx3.2cm enlarged right axillary LN highly suggestive of malignancy.     03/31/2019 Initial Biopsy   Diagnosis 03/31/19  1. Breast, right, needle core biopsy, 9:30 o'clock, mid depth, 3cmfn - INVASIVE DUCTAL CARCINOMA. - LYMPHOVASCULAR INVASION IS IDENTIFIED. - SEE COMMENT. 2. Lymph node, needle/core biopsy, right axilla - INVASIVE DUCTAL CARCINOMA. - SEE COMMENT.   03/31/2019 Receptors her2   1. PROGNOSTIC INDICATORS Results: IMMUNOHISTOCHEMICAL AND MORPHOMETRIC ANALYSIS PERFORMED MANUALLY The tumor cells are NEGATIVE for Her2 (0). Estrogen Receptor: 100%, POSITIVE, STRONG STAINING INTENSITY Progesterone Receptor: 10%, POSITIVE, STRONG STAINING INTENSITY Proliferation Marker Ki67: 10%  2. PROGNOSTIC INDICATORS Results: IMMUNOHISTOCHEMICAL AND MORPHOMETRIC ANALYSIS PERFORMED MANUALLY Estrogen Receptor: 100%, POSITIVE, STRONG STAINING INTENSITY Progesterone Receptor: 0%, NEGATIVE Proliferation Marker Ki67: 40%   04/03/2019 Initial Diagnosis   Malignant neoplasm of upper-outer quadrant of right breast in female, estrogen receptor positive (HCC)   04/09/2019 Cancer Staging    Staging form: Breast, AJCC 8th Edition - Clinical: Stage IIA (cT1c, cN1, cM0, G3, ER+, PR+, HER2-) - Signed by Sonja Greenfield, MD on 04/09/2019  Staging form: Breast, AJCC 8th Edition - Clinical: Stage IIA (cT1c, cN1, cM0, G3, ER+, PR+, HER2-) - Signed by Sonja Chariton, MD on 04/09/2019   03/2019 -  Anti-estrogen oral therapy   Tamoxifen  20mg  daily starting 03/2019. Held 05/30/19 -07/2019 for surgery.   06/19/2019 Surgery   RIGHT BREAST LUMPECTOMY WITH RADIOACTIVE SEED AND SENTINEL LYMPH NODE BIOPSY by Dr Alethea Andes  06/19/19    06/19/2019 Pathology Results   FINAL MICROSCOPIC DIAGNOSIS: 06/19/19  A. BREAST, RIGHT, LUMPECTOMY:  -  Mucinous carcinoma, Nottingham grade 2  -  Margins uninvolved by carcinoma (0.2 cm; medial margin)  -  Lymphovascular space invasion present  -  Previous biopsy site changes present  -  See oncology table and comment below   B. LYMPH NODE, RIGHT AXILLARY #1, SENTINEL, BIOPSY:  -  Metastatic carcinoma involving two lymph nodes (2/2)  -  Extracapsular extension present  -  See comment   C. BREAST, RIGHT MEDIAL MARGIN, EXCISION:  -  Residual invasive carcinoma  -  Usual ductal hyperplasia with calcifications  -  See comment   D. LYMPH NODE, RIGHT AXILLARY #2, SENTINEL, BIOPSY:  -  Metastatic carcinoma involving one lymph node (1/1)   E. LYMPH NODE, RIGHT AXILLARY #1 , BIOPSY:  -  Metastatic carcinoma involving one lymph node (1/1)   F. LYMPH NODE, RIGHT AXILLARY #2, BIOPSY:  -  No carcinoma identified in one lymph node (0/1)    06/19/2019 Cancer Staging   Staging form: Breast, AJCC 8th Edition - Pathologic stage from  06/19/2019: Stage IB (pT1c, pN2a, cM0, G2, ER+, PR+, HER2-) - Signed by Sonja Tonsina, MD on 07/03/2019   07/22/2019 PET scan   IMPRESSION: 1. Low level hypermetabolism identified in the surgical beds of the right breast and right axilla. This is presumably related to the surgery. 2. Tiny right subpectoral lymph nodes are asymmetric in concerning for  metastatic disease on CT imaging. No hypermetabolism on the PET study although size of these lymph nodes is below reliable resolution on PET imaging. 3. 10 mm short axis precarinal lymph node shows low level hypermetabolism. Metastatic involvement not excluded. 4. 11 mm ground-glass nodule in right upper lobe shows low level FDG accumulation. Well differentiated or low-grade neoplasm is a distinct concern. Close follow-up recommended. 5. Focal hypermetabolism in the right colon, potentially related to the ileocecal valve but incompletely characterized. Adenoma or carcinoma could have this appearance. Correlation with colorectal cancer screening history recommended. 6. Focal hypermetabolism identified in the region of the left pubic bone and adjacent sigmoid colon. No underlying bony or colonic lesion evident. Metastatic involvement of the left pubic bone not excluded. 7.  Aortic Atherosclerois (ICD10-170.0)     08/05/2019 - 09/03/2019 Radiation Therapy   Adjuvant RT with Dr Lurena Sally 08/05/19-09/03/19.    06/13/2021 PET scan   IMPRESSION: 1. Sub solid right upper lobe pulmonary nodule shows low level hypermetabolism. Neoplasm not excluded. 2. 11 mm ground-glass nodule superior segment left lower lobe shows very low level FDG accumulation. Findings may be infectious/inflammatory. As well differentiated in low-grade neoplasm can be poorly FDG avid, continued close attention recommended. 3. No evidence for hypermetabolic metastatic disease in the abdomen or pelvis. 4.  Aortic Atherosclerois (ICD10-170.0) 5.  Emphysema. ([id number])   Cancer of right colon (HCC)  10/15/2019 Procedure   Colonoscopy by Dr Leonia Raman  IMPRESSION - Likely malignant tumor in the proximal ascending colon. Biopsied. Tattooed. - Severe diverticulosis in the sigmoid colon, in the descending colon, in the transverse colon and in the ascending colon. - Non-bleeding internal hemorrhoids.   10/15/2019 Initial  Biopsy   Diagnosis Ascending Colon Polyp, mass - ADENOCARCINOMA. Microscopic Comment IHC for MMR will be reported separately. Results reported to Dr. Kavitha Nandigam on 10/16/2019. Intradepartmental consultation (Dr. Bernetta Brilliant).   12/15/2019 Initial Diagnosis   Cancer of right colon (HCC)   12/15/2019 Cancer Staging   Staging form: Colon and Rectum, AJCC 8th Edition - Pathologic stage from 12/15/2019: Stage I (pT2, pN0, cM0) - Signed by Sonja Gerber, MD on 01/08/2020   12/15/2019 Surgery   LAPAROSCOPIC ASSISTED RIGHT COLECTOMY by Dr Alethea Andes and Dr Andy Bannister   12/15/2019 Pathology Results   FINAL MICROSCOPIC DIAGNOSIS:   A. COLON, TERMINAL ILEUM AND RIGHT, COLECTOMY:  - Invasive moderately differentiated adenocarcinoma, 4.6 cm, involving  ascending colon  - Carcinoma invades into deeper muscularis propria but not beyond  - Resection margins are negative for carcinoma  - Negative for lymphovascular or perineural invasion  - Unremarkable appendix  - Nineteen lymph nodes, negative for carcinoma (0/19)  - See oncology table    12/15/2019 Genetic Testing   ADDENDUM:   Mismatch Repair Protein (IHC)  SUMMARY INTERPRETATION: ABNORMAL  There is loss of the major and minor MMR proteins MLH1 and PMS2. The  loss of expression may be secondary to promoter hyper-methylation, gene  mutation or other genetic event. BRAF mutation testing and/or MLH1  methylation testing is indicated. The presence of a BRAF mutation and/or  MLH1 hypermethylation is indicative of a sporadic-type tumor. The  absence of either BRAF  mutation and/or presence of normal methylation  indicate the possible presence of a hereditary germline mutation (e.g.  Lynch syndrome) and referral to genetic counseling is warranted. It is  recommended that the loss of protein expression be correlated with  molecular based MSI testing.   IHC EXPRESSION RESULTS  TEST           RESULT  MLH1:          LOSS OF NUCLEAR EXPRESSION  MSH2:           Preserved nuclear expression  MSH6:          Preserved nuclear expression  PMS2:          LOSS OF NUCLEAR EXPRESSION    12/29/2019 Imaging   CT AP W contrast  IMPRESSION: 1. Sigmoid diverticulosis without inflammation. Status post right colectomy. 2. Multiple small calcified uterine fibroids. 3. No acute abnormality seen in the abdomen or pelvis.   Aortic Atherosclerosis (ICD10-I70.0).     01/28/2020 Imaging   CT AP w contrast  IMPRESSION: 1. Moderate irregular wall thickening of the right colon with some areas of low attenuation. Findings could be due to cecal diverticulitis or other inflammatory or infectious colitis. I doubt that this represents recurrent colon cancer. 2. Status post partial right colectomy. No findings for locoregional adenopathy or metastatic disease. 3. Stable advanced atherosclerotic calcifications involving the aorta and branch vessels. 4. Stable benign-appearing central hepatic cysts. 5. Stable calcified uterine fibroids. 6. Aortic atherosclerosis.   Aortic Atherosclerosis (ICD10-I70.0).   04/30/2020 Imaging   CT Chest  IMPRESSION: 1. Ground-glass nodule in the RIGHT upper lobe measuring 1.4 x 0.9 cm previously 1.4 x 0.9 cm. 2. Ground-glass nodule in the LEFT upper lobe measuring 11 x 12 mm, spiculated margins and mild fissural distortion, accompanying features of this nodule. Findings are concerning for for multifocal bronchogenic neoplasm given persistence. 3. Stable size of small nodes along the RIGHT paratracheal chain largest approximately 9 mm. 4. Stable hepatic hemangioma. 5. Dilation of the ascending aorta to 3.7 cm, similar to previous exams. 6. Three-vessel coronary artery disease. 7. Aortic atherosclerosis.   Aortic Atherosclerosis (ICD10-I70.0).     02/01/2021 Imaging   CT CAP  IMPRESSION: No evidence of recurrent or metastatic carcinoma within the abdomen or pelvis. No evidence of thoracic metastatic disease.   Stable hepatic  hemangioma.   Stable small calcified uterine fibroids.   Colonic diverticulosis. No radiographic evidence of diverticulitis.   Sub-solid pulmonary nodule in anterior right upper lobe shows increased solid component, suspicious for primary lung adenocarcinoma. PET-CT is recommended for further evaluation.   Stable 11 mm ground-glass nodule in superior segment of left lower lobe. Recommend continued attention on follow-up imaging, and this could also be assessed by PET-CT.   Aortic Atherosclerosis (ICD10-I70.0) and Emphysema (ICD10-J43.9).     06/13/2021 PET scan   IMPRESSION: 1. Sub solid right upper lobe pulmonary nodule shows low level hypermetabolism. Neoplasm not excluded. 2. 11 mm ground-glass nodule superior segment left lower lobe shows very low level FDG accumulation. Findings may be infectious/inflammatory. As well differentiated in low-grade neoplasm can be poorly FDG avid, continued close attention recommended. 3. No evidence for hypermetabolic metastatic disease in the abdomen or pelvis. 4.  Aortic Atherosclerois (ICD10-170.0) 5.  Emphysema. (ICD10-J43.9)   10/17/2022 Imaging    IMPRESSION: 1. Progressive enlargement and spiculation of the previously demonstrated right upper lobe nodule, highly suspicious for malignancy, likely primary bronchogenic carcinoma. 2. Additional ground-glass and part solid nodules in the lingula and  superior segment of the left lower lobe are grossly stable from previous CT of 2022. Continued follow-up recommended. 3. No typical evidence of metastatic disease. 4. Stable postsurgical changes in the right breast and right axilla. 5. Aortic Atherosclerosis (ICD10-I70.0) and Emphysema (ICD10-J43.9).       Discussed the use of AI scribe software for clinical note transcription with the patient, who gave verbal consent to proceed.  History of Present Illness The patient is a 89 year old female with a history of breast and colon cancer.  She recently underwent a PET scan which revealed a nodule in her lung that has been slowly growing over the past three and a half years. The nodule, which was initially small and decided to be followed up, has now grown significantly and is slightly above normal on the PET scan. The patient used to be a moderate smoker for most of her life, which is a risk factor for lung cancer. She also experiences pain on the right side of her chest, which she describes as being on the surface and possibly related to her previous breast surgery.     REVIEW OF SYSTEMS:   Constitutional: Denies fevers, chills or abnormal weight loss Eyes: Denies blurriness of vision Ears, nose, mouth, throat, and face: Denies mucositis or sore throat Respiratory: Denies cough, dyspnea or wheezes Cardiovascular: Denies palpitation, chest discomfort or lower extremity swelling Gastrointestinal:  Denies nausea, heartburn or change in bowel habits Skin: Denies abnormal skin rashes Lymphatics: Denies new lymphadenopathy or easy bruising Neurological:Denies numbness, tingling or new weaknesses Behavioral/Psych: Mood is stable, no new changes  All other systems were reviewed with the patient and are negative.  MEDICAL HISTORY:  Past Medical History:  Diagnosis Date   Anxiety    Arthritis    Breast cancer (HCC)    CAD (coronary artery disease)    a. 03/2017: 95% LCx stenosis --> PCI/DES placed   Cataract    Chest pain 03/27/2017   Chronic diastolic heart failure (HCC) 06/25/2017   Chronic lower back pain    Colon cancer (HCC) 11/2019   s/p colectomy   COPD (chronic obstructive pulmonary disease) (HCC)    Coronary artery disease    Diverticulitis    Diverticulosis    GERD (gastroesophageal reflux disease)    Glaucoma    Heart failure, type unknown (HCC) 03/11/2017   History of radiation therapy 08/05/19- 09/03/19   Right Breast/ SCV 16 fractions of 2.66 Gy each for a total of 42.56 Gy. Right breast boost 4 fractions of 2  Gy each to total 8 Gy.    Hyperlipidemia 03/11/2017   Hypertension    Hypothyroidism    Macular degeneration    wet and dry per pt's son   Personal history of chemotherapy    Personal history of radiation therapy    Pneumonia X 1   "years and years ago" (03/26/2017)   Shortness of breath 03/11/2017   Status post dilation of esophageal narrowing     SURGICAL HISTORY: Past Surgical History:  Procedure Laterality Date   BILATERAL OOPHORECTOMY Bilateral    BREAST LUMPECTOMY Right 06/19/2019   BREAST LUMPECTOMY WITH RADIOACTIVE SEED AND SENTINEL LYMPH NODE BIOPSY Right 06/19/2019   Procedure: RIGHT BREAST LUMPECTOMY WITH RADIOACTIVE SEED AND SENTINEL LYMPH NODE BIOPSY;  Surgeon: Caralyn Chandler, MD;  Location: [facility] OR;  Service: General;  Laterality: Right;   CATARACT EXTRACTION     CATARACT EXTRACTION W/ INTRAOCULAR LENS  IMPLANT, BILATERAL Bilateral    COLOSTOMY  Greater than 10 yrs   CORONARY ANGIOPLASTY WITH STENT PLACEMENT  03/26/2017   LAPAROSCOPIC RIGHT COLECTOMY Right 12/15/2019   Procedure: LAPAROSCOPIC ASSISTED RIGHT COLECTOMY;  Surgeon: Caralyn Chandler, MD;  Location: [facility] OR;  Service: General;  Laterality: Right;   LEFT HEART CATH AND CORONARY ANGIOGRAPHY N/A 03/26/2017   Procedure: Left Heart Cath and Coronary Angiography;  Surgeon: Arty Binning, MD;  Location: [facility] INVASIVE CV LAB;  Service: Cardiovascular;  Laterality: N/A;   NERVE SURGERY     "lump on my sciatic nerve was removed years ago"   SHOULDER OPEN ROTATOR CUFF REPAIR Right    THYROIDECTOMY, PARTIAL     TONSILLECTOMY     UPPER GASTROINTESTINAL ENDOSCOPY     > ten yrs    I have reviewed the social history and family history with the patient and they are unchanged from previous note.  ALLERGIES:  is allergic to lipitor Amariah.Awe ].  MEDICATIONS:  Current Outpatient Medications  Medication Sig Dispense Refill   albuterol  (PROVENTIL  HFA;VENTOLIN  HFA) 108 (90 Base) MCG/ACT inhaler Inhale 2 puffs into the lungs  every 6 (six) hours as needed for wheezing or shortness of breath.     amLODipine  (NORVASC ) 5 MG tablet Take 1 tablet (5 mg total) by mouth daily. 30 tablet 1   arformoterol  (BROVANA ) 15 MCG/2ML NEBU Take 2 mLs (15 mcg total) by nebulization 2 (two) times daily. 120 mL 0   brimonidine -timolol  (COMBIGAN) 0.2-0.5 % ophthalmic solution Apply to eye.     budesonide  (PULMICORT ) 0.25 MG/2ML nebulizer solution Take 2 mLs (0.25 mg total) by nebulization 2 (two) times daily. 60 mL 12   Calcium  Carb-Cholecalciferol  (CALCIUM  + D3) 600-200 MG-UNIT TABS Take 1 tablet by mouth daily.     Cholecalciferol  (VITAMIN D3) 2000 units capsule Take 4,000 Units by mouth daily.     Coenzyme Q10 (COQ10) 100 MG CAPS Take 100 mg by mouth daily.     diclofenac  (CATAFLAM ) 50 MG tablet Take 1 tablet (50 mg total) by mouth daily after breakfast. 30 tablet 3   famotidine  (PEPCID ) 20 MG tablet Take 20 mg by mouth daily.     gabapentin  (NEURONTIN ) 100 MG capsule Take 1 capsule (100 mg total) by mouth at bedtime. 30 capsule 3   guaiFENesin  (MUCINEX ) 600 MG 12 hr tablet Take 2 tablets (1,200 mg total) by mouth 2 (two) times daily. 30 tablet 0   ipratropium-albuterol  (DUONEB) 0.5-2.5 (3) MG/3ML SOLN Take 3 mLs by nebulization every 6 (six) hours as needed. 360 mL 0   latanoprost  (XALATAN ) 0.005 % ophthalmic solution Place 1 drop into both eyes at bedtime.     levothyroxine  (SYNTHROID , LEVOTHROID) 100 MCG tablet Take 100 mcg by mouth daily before breakfast.   0   LORazepam  (ATIVAN ) 0.5 MG tablet Take 0.25-0.5 mg by mouth every 8 (eight) hours as needed for anxiety.     LUTEIN  PO Take 1 tablet by mouth daily.     Multiple Vitamin (MULTIVITAMIN) tablet Take 1 tablet by mouth daily.     nitroGLYCERIN  (NITROSTAT ) 0.4 MG SL tablet Place 1 tablet (0.4 mg total) under the tongue every 5 (five) minutes as needed for chest pain. 25 tablet 3   Omega-3 Fatty Acids (FISH OIL PO) Take 1 capsule by mouth daily.     pravastatin  (PRAVACHOL ) 40 MG  tablet TAKE 1 TABLET BY MOUTH DAILY GENERIC EQUIVALENT FOR PRAVACHOL  90 tablet 0   solifenacin (VESICARE) 5 MG tablet Take 5 mg by mouth daily.     tamoxifen  (  NOLVADEX ) 20 MG tablet TAKE 1 TABLET BY MOUTH ONCE DAILY. APPOINTMENT REQUIRED FOR FUTURE REFILLS 90 tablet 3   traMADol  (ULTRAM ) 50 MG tablet Take 0.5-1 tablets (25-50 mg total) by mouth every 8 (eight) hours as needed. 30 tablet 0   No current facility-administered medications for this visit.    PHYSICAL EXAMINATION: Not performed   LABORATORY DATA:  I have reviewed the data as listed    Latest Ref Rng & Units 11/22/2023   10:50 AM 10/11/2023    9:26 AM 10/09/2023    6:58 AM  CBC  WBC 4.0 - 10.5 K/uL 5.4  7.5  10.0   Hemoglobin 12.0 - 15.0 g/dL 95.6  21.3  08.6   Hematocrit 36.0 - 46.0 % 30.1  30.9  33.7   Platelets 150 - 400 K/uL 221  179  183         Latest Ref Rng & Units 11/22/2023   10:50 AM 10/12/2023    9:26 AM 10/11/2023    9:26 AM  CMP  Glucose 70 - 99 mg/dL 98  578  469   BUN 8 - 23 mg/dL 24  10  15    Creatinine 0.44 - 1.00 mg/dL 6.29  5.28  4.13   Sodium 135 - 145 mmol/L 138  132  132   Potassium 3.5 - 5.1 mmol/L 4.4  4.5  3.4   Chloride 98 - 111 mmol/L 104  103  100   CO2 22 - 32 mmol/L 29  21  21    Calcium  8.9 - 10.3 mg/dL 9.0  8.1  7.9   Total Protein 6.5 - 8.1 g/dL 6.7     Total Bilirubin 0.0 - 1.2 mg/dL 0.5     Alkaline Phos 38 - 126 U/L 41     AST 15 - 41 U/L 18     ALT 0 - 44 U/L 8         RADIOGRAPHIC STUDIES: I have personally reviewed the radiological images as listed and agreed with the findings in the report. No results found.     I discussed the assessment and treatment plan with the patient. The patient was provided an opportunity to ask questions and all were answered. The patient agreed with the plan and demonstrated an understanding of the instructions.   The patient was advised to call back or seek an in-person evaluation if the symptoms worsen or if the condition fails to improve  as anticipated.  I provided 25 minutes of non face-to-face telephone visit time during this encounter, and > 50% was spent counseling as documented under my assessment & plan.     Sonja Kennedy, MD 12/13/2023

## 2023-12-14 NOTE — Assessment & Plan Note (Signed)
 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 Margins negative. -she received adjuvant RT with Dr. Basilio Cairo 08/05/19-09/03/19.  -She started Tamoxifen in 03/2019 and held 05/2019-07/2019 for surgery.  -most recent MM 04/2023 was benign -From a breast cancer standpoint, she is stable.  -Continue Tamoxifen and surveillance with yearly mammograms

## 2023-12-17 NOTE — Progress Notes (Signed)
 The proposed treatment discussed in conference is for discussion purpose only and is not a binding recommendation.  The patients have not been physically examined, or presented with their treatment options.  Therefore, final treatment plans cannot be decided.

## 2024-01-01 NOTE — Progress Notes (Signed)
 [facility] - Clinic Note  01/07/2024     CHIEF COMPLAINT Patient presents for Retina Follow Up    HISTORY OF PRESENT ILLNESS: Marissa Caldwell is a 89 y.o. female who presents to the clinic today for:   HPI     Retina Follow Up   Patient presents with  Wet AMD.  In both eyes.  This started 6 weeks ago.  I, the attending physician,  performed the HPI with the patient and updated documentation appropriately.        Comments   Patient feels the vision is the same. She is using Latanoprost  OU qhs / Brimonidine  BID OU.       Last edited by Janaisa Birkland, MD on 01/07/2024  5:09 PM.     Pt states vision is stable, she can see colors better than before  Referring physician:  Victorio Grave, MD [phone] [address] [place],  [place] [address]  HISTORICAL INFORMATION:   Selected notes from the MEDICAL RECORD NUMBER Referred by Dr. 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   latanoprost  (XALATAN ) 0.005 % ophthalmic solution Place 1 drop into both eyes at bedtime.   No current facility-administered medications for this visit. (Ophthalmic Drugs)   Current Outpatient Medications (Other)  Medication Sig   albuterol  (PROVENTIL  HFA;VENTOLIN  HFA) 108 (90 Base) MCG/ACT inhaler Inhale 2 puffs into the lungs every 6 (six) hours as needed for wheezing or shortness of breath.   amLODipine  (NORVASC ) 5 MG tablet Take 1 tablet (5 mg total) by mouth daily.   arformoterol  (BROVANA ) 15 MCG/2ML NEBU Take 2 mLs (15 mcg total) by nebulization 2 (two) times daily.   budesonide  (PULMICORT ) 0.25 MG/2ML nebulizer solution Take 2 mLs (0.25 mg total) by nebulization 2 (two) times daily.   Calcium  Carb-Cholecalciferol  (CALCIUM  + D3) 600-200 MG-UNIT TABS Take 1 tablet by mouth daily.   Cholecalciferol  (VITAMIN  D3) 2000 units capsule Take 4,000 Units by mouth daily.   Coenzyme Q10 (COQ10) 100 MG CAPS Take 100 mg by mouth daily.   diclofenac  (CATAFLAM ) 50 MG tablet Take 1 tablet (50 mg total) by mouth daily after breakfast.   famotidine  (PEPCID ) 20 MG tablet Take 20 mg by mouth daily.   gabapentin  (NEURONTIN ) 100 MG capsule Take 1 capsule (100 mg total) by mouth at bedtime.   guaiFENesin  (MUCINEX ) 600 MG 12 hr tablet Take 2 tablets (1,200 mg total) by mouth 2 (two) times daily.   ipratropium-albuterol  (DUONEB) 0.5-2.5 (3) MG/3ML SOLN Take 3 mLs by nebulization every 6 (six) hours as needed.   levothyroxine  (SYNTHROID , LEVOTHROID) 100 MCG tablet Take 100 mcg by mouth daily before breakfast.    LORazepam  (ATIVAN ) 0.5 MG tablet Take 0.25-0.5 mg by mouth every 8 (eight) hours as needed for anxiety.   LUTEIN  PO Take 1 tablet by mouth daily.   Multiple Vitamin (MULTIVITAMIN) tablet Take 1 tablet by mouth daily.   nitroGLYCERIN  (NITROSTAT ) 0.4 MG SL tablet Place 1 tablet (0.4 mg total) under the tongue every 5 (five) minutes as needed for chest pain.   Omega-3 Fatty Acids (FISH OIL PO) Take 1 capsule by mouth daily.   pravastatin  (PRAVACHOL ) 40 MG tablet TAKE 1 TABLET BY MOUTH DAILY GENERIC EQUIVALENT FOR PRAVACHOL    solifenacin (VESICARE) 5 MG tablet Take 5 mg by mouth daily.   tamoxifen  (NOLVADEX ) 20 MG  tablet TAKE 1 TABLET BY MOUTH ONCE DAILY. APPOINTMENT REQUIRED FOR FUTURE REFILLS   traMADol  (ULTRAM ) 50 MG tablet Take 0.5-1 tablets (25-50 mg total) by mouth every 8 (eight) hours as needed.   No current facility-administered medications for this visit. (Other)   REVIEW OF SYSTEMS: ROS   Positive for: HENT, Endocrine, Cardiovascular, Eyes, Respiratory, Heme/Lymph Negative for: Constitutional, Gastrointestinal, Neurological, Skin, Genitourinary, Musculoskeletal, Psychiatric, Allergic/Imm Last edited by Olene Berne, COT on 01/07/2024  1:18 PM.      ALLERGIES Allergies  Allergen Reactions    Lipitor [Atorvastatin ]     Elevated LFT's   PAST MEDICAL HISTORY Past Medical History:  Diagnosis Date   Anxiety    Arthritis    Breast cancer (HCC)    CAD (coronary artery disease)    a. 03/2017: 95% LCx stenosis --> PCI/DES placed   Cataract    Chest pain 03/27/2017   Chronic diastolic heart failure (HCC) 06/25/2017   Chronic lower back pain    Colon cancer (HCC) 11/2019   s/p colectomy   COPD (chronic obstructive pulmonary disease) (HCC)    Coronary artery disease    Diverticulitis    Diverticulosis    GERD (gastroesophageal reflux disease)    Glaucoma    Heart failure, type unknown (HCC) 03/11/2017   History of radiation therapy 08/05/19- 09/03/19   Right Breast/ SCV 16 fractions of 2.66 Gy each for a total of 42.56 Gy. Right breast boost 4 fractions of 2 Gy each to total 8 Gy.    Hyperlipidemia 03/11/2017   Hypertension    Hypothyroidism    Macular degeneration    wet and dry per pt's son   Personal history of chemotherapy    Personal history of radiation therapy    Pneumonia X 1   "years and years ago" (03/26/2017)   Shortness of breath 03/11/2017   Status post dilation of esophageal narrowing    Past Surgical History:  Procedure Laterality Date   BILATERAL OOPHORECTOMY Bilateral    BREAST LUMPECTOMY Right 06/19/2019   BREAST LUMPECTOMY WITH RADIOACTIVE SEED AND SENTINEL LYMPH NODE BIOPSY Right 06/19/2019   Procedure: RIGHT BREAST LUMPECTOMY WITH RADIOACTIVE SEED AND SENTINEL LYMPH NODE BIOPSY;  Surgeon: Caralyn Chandler, MD;  Location: [facility] OR;  Service: General;  Laterality: Right;   CATARACT EXTRACTION     CATARACT EXTRACTION W/ INTRAOCULAR LENS  IMPLANT, BILATERAL Bilateral    COLOSTOMY     Greater than 10 yrs   CORONARY ANGIOPLASTY WITH STENT PLACEMENT  03/26/2017   LAPAROSCOPIC RIGHT COLECTOMY Right 12/15/2019   Procedure: LAPAROSCOPIC ASSISTED RIGHT COLECTOMY;  Surgeon: Caralyn Chandler, MD;  Location: [facility] OR;  Service: General;  Laterality: Right;   LEFT HEART CATH AND  CORONARY ANGIOGRAPHY N/A 03/26/2017   Procedure: Left Heart Cath and Coronary Angiography;  Surgeon: Arty Binning, MD;  Location: [facility] INVASIVE CV LAB;  Service: Cardiovascular;  Laterality: N/A;   NERVE SURGERY     "lump on my sciatic nerve was removed years ago"   SHOULDER OPEN ROTATOR CUFF REPAIR Right    THYROIDECTOMY, PARTIAL     TONSILLECTOMY     UPPER GASTROINTESTINAL ENDOSCOPY     > ten yrs   FAMILY HISTORY Family History  Problem Relation Age of Onset   CAD Mother    Cataracts Mother    Coronary artery disease Father    Leukemia Sister    Amblyopia Neg Hx    Blindness Neg Hx    Diabetes Neg Hx  Glaucoma Neg Hx    Macular degeneration Neg Hx    Retinal detachment Neg Hx    Strabismus Neg Hx    Retinitis pigmentosa Neg Hx    Colon cancer Neg Hx    Colon polyps Neg Hx    Esophageal cancer Neg Hx    Rectal cancer Neg Hx    Stomach cancer Neg Hx    SOCIAL HISTORY Social History   Tobacco Use   Smoking status: Former    Current packs/day: 0.50    Average packs/day: 0.5 packs/day for 60.0 years (30.0 ttl pk-yrs)    Types: Cigarettes   Smokeless tobacco: Never  Vaping Use   Vaping status: Never Used  Substance Use Topics   Alcohol use: Not Currently    Comment: twice a month   Drug use: No       OPHTHALMIC EXAM:  Base Eye Exam     Visual Acuity (Snellen - Linear)       Right Left   Dist [place] CF at 3' CF at 3'   Dist ph [place] NI NI         Tonometry (Tonopen, 1:20 PM)       Right Left   Pressure 13 10         Pupils       Dark Light Shape React APD   Right 3 2 Round Sluggish None   Left 3 2 Round Sluggish None         Visual Fields       Left Right    Full Full         Extraocular Movement       Right Left    Full, Ortho Full, Ortho         Neuro/Psych     Oriented x3: Yes   Mood/Affect: Normal         Dilation     Both eyes: 1.0% Mydriacyl, 2.5% Phenylephrine  @ 1:18 PM           Slit Lamp and Fundus Exam      Slit Lamp Exam       Right Left   Lids/Lashes Dermatochalasis - upper lid, Telangiectasia, Meibomian gland dysfunction - improving Dermatochalasis - upper lid, Telangiectasia, Meibomian gland dysfunction   Conjunctiva/Sclera White and quiet trace Injection   Cornea Arcus, 2-3+ Punctate epithelial erosions, 3+ fine endo pigment, tear film debris, mild central haze Arcus, 3+ Punctate epithelial erosions, endo pigment / Krunkenberg's spindle, mild central haze   Anterior Chamber Deep and quiet Deep and quiet   Iris Round and dilated Round and dilated   Lens Three piece PC IOL in good position, open PC PC IOL in good position, clear PC   Anterior Vitreous Vitreous syneresis, Posterior vitreous detachment, mild vitreous condensations Mild Vitreous syneresis, Posterior vitreous detachment         Fundus Exam       Right Left   Disc sharp rim, 360 Peripapillary atrophy, 2-3+Pallor 360 Peripapillary atrophy, 2+pallor, sharp rim   C/D Ratio 0.2 0.1   Macula Blunted foveal reflex, Drusen, RPE mottling, clumping and atrophy, +PED, +CNVM, sub-retinal central Disciform scar with subretinal fibrosis, no heme, central pigment clumping Blunted foveal reflex, Drusen, RPE mottling, clumping, and atrophy, focal GA nasal macula extending into fovea, CNV with +edema temporal to fovea -- improving, early fibrosis, no frank heme   Vessels attenuated, mild tortuosity attenuated, mild tortuosity   Periphery Attached, reticular degeneration, No heme Attached, reticular degeneration, focal  IRH above disc           IMAGING AND PROCEDURES  Imaging and Procedures for @TODAY @  OCT, Retina - OU - Both Eyes        Right Eye Quality was good. Central Foveal Thickness: 274. Progression has been stable. Findings include no IRF, no SRF, abnormal foveal contour, retinal drusen , subretinal hyper-reflective material, disciform scar, epiretinal membrane, pigment epithelial detachment, outer retinal atrophy (persistent  thick SRHM/disciform scar -- stable from prior).   Left Eye Quality was good. Central Foveal Thickness: 381. Progression has improved. Findings include no IRF, no SRF, abnormal foveal contour, retinal drusen , subretinal hyper-reflective material, pigment epithelial detachment, outer retinal atrophy (Mild interval improvement in PED/CNV and SRHM / edema overlying, Significant central GA / ORA ).   Notes  *Images captured and stored on drive  Diagnosis / Impression:  OD: Exudative AMD with significant submacular scar/SRHM --  persistent thick SRHM/disciform scar -- no IRF/SRF OS: Mild interval improvement in PED/CNV and SRHM / edema overlying, Significant central GA / ORA   Clinical management:  See below  Abbreviations: NFP - Normal foveal profile. CME - cystoid macular edema. PED - pigment epithelial detachment. IRF - intraretinal fluid. SRF - subretinal fluid. EZ - ellipsoid zone. ERM - epiretinal membrane. ORA - outer retinal atrophy. ORT - outer retinal tubulation. SRHM - subretinal hyper-reflective material       Intravitreal Injection, Pharmacologic Agent - OS - Left Eye       Time Out 01/07/2024. 2:02 PM. Confirmed correct patient, procedure, site, and patient consented.   Anesthesia Topical anesthesia was used. Anesthetic medications included Lidocaine  2%, Proparacaine 0.5%.   Procedure Preparation included 5% betadine  to ocular surface, eyelid speculum. A supplied needle was used.   Injection: 1.25 mg Bevacizumab  1.25mg /0.29ml   Route: Intravitreal, Site: Left Eye   NDC: [id number], Lot: [id number], Expiration date: 02/08/2024   Post-op Post injection exam found visual acuity of at least counting fingers. The patient tolerated the procedure well. There were no complications. The patient received written and verbal post procedure care education. Post injection medications were not given.            ASSESSMENT/PLAN:    ICD-10-CM   1. 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 Exudative age-related macular degeneration of left eye with active choroidal neovascularization (HCC)  H35.3221 OCT, Retina - OU - Both Eyes    Intravitreal Injection, Pharmacologic Agent - OS - Left Eye    Bevacizumab  (AVASTIN ) SOLN 1.25 mg    3. Posterior vitreous detachment of both eyes  H43.813     4. Pigmentary glaucoma of both eyes, mild stage  H40.1331     5. Pseudophakia of both eyes  Z96.1     6. Dry eyes  H04.123       1. Exudative age related macular degeneration, OD -- inactive CNV / scar  - onset ~2 mos prior to presentation, waited for scheduled appt with Dr. Reyne Cave  - S/P IVA OD #1 (05.20.19), #2 (07.02.19), #3 (07.30.19) - S/P IVE OD #1 (08.27.19), #2 (09.24.19), #3 (11.05.19), #4 (12.17.19), #5 (02.21.20), #6 (05.05.20), # 7 (08.05.20), #8 (9.2.20), #9 (11.10.20), #10 (02.12.21), #11 (05.12.21), #12 (08.13.21)  - VA remains CF - OCT with persistent thick /SRHM/disciform scar centrally  - discussed findings and guarded prognosis and treatment options - due to stable, inactive submacular scar and  VA CF -- recommend holding off on injection today  - pt in agreement  2. Exudative age related macular degeneration, left eye  - interval conversion from nonexudative ARMD noted on 01.10.25 visit  -- pt was lost to retina f/u from Jan 2024 to Jan 2025 - referred back here by Dr. Carloyn Chi - s/p IVA OS #1 (01.10.25), #2 (02.10.25), #3 (03.24.25)  - BCVA OS: stable at CF 3'  - OCT shows Interval improvement PED/CNV and SRHM/edema overlying, Significant central GA / ORA at 8 weeks - recommend IVA OS #4 today 05.19.25 with f/u extended to 10 weeks - pt wishes to proceed with injection - RBA of procedure discussed, questions answered - Avastin  consent obtained, signed, and scanned 01.10.25 - see procedure note   - f/u 10 weeks, DFE, OCT, possible injection  3. PVD / vitreous syneresis  OU  - Discussed findings and prognosis  - No RT or RD on 360 peripheral exam  - Reviewed s/s of RT/RD  - strict return precautions for any such RT/RD symptoms  4. Pigmentary glaucoma OU-   - using latanoprost  OU qhs / brimonidine  BID OU  - IOP good today -- 08,06  - monitor  5. Pseudophakia OU  - s/p CE/IOL OU  - beautiful surgery, doing well  - monitor  6. Dry eyes OU  - recommend artificial tears and lubricating ointment as needed  - monitor  Ophthalmic Meds Ordered this visit:  Meds ordered this encounter  Medications   Bevacizumab  (AVASTIN ) SOLN 1.25 mg     Return in about 10 weeks (around 03/17/2024) for f/u exu ARMD OU, DFE, OCT, Possible Injxn.  There are no Patient Instructions on file for this visit.  This document serves as a record of services personally performed by Jeanice Millard, MD, PhD. It was created on their behalf by Mayola Specking, COA an ophthalmic technician. The creation of this record is the provider's dictation and/or activities during the visit.   Electronically signed by: Carrington Clack, COT  01/09/24  12:54 AM    This document serves as a record of services personally performed by Jeanice Millard, MD, PhD. It was created on their behalf by Morley Arabia. Bevin Bucks, OA an ophthalmic technician. The creation of this record is the provider's dictation and/or activities during the visit.    Electronically signed by: Morley Arabia. Bevin Bucks, OA 01/09/24 12:54 AM   Jeanice Millard, M.D., Ph.D. Diseases & Surgery of the Retina and Vitreous [facility]  I have reviewed the above documentation for accuracy and completeness, and I agree with the above. Jeanice Millard, M.D., Ph.D. 01/09/24 12:55 AM   Abbreviations: M myopia (nearsighted); A astigmatism; H hyperopia (farsighted); P presbyopia; Mrx spectacle prescription;  CTL contact lenses; OD right eye; OS left eye; OU both eyes  XT exotropia; ET esotropia; PEK punctate epithelial keratitis; PEE  punctate epithelial erosions; DES dry eye syndrome; MGD meibomian gland dysfunction; ATs artificial tears; PFAT's preservative free artificial tears; NSC nuclear sclerotic cataract; PSC posterior subcapsular cataract; ERM epi-retinal membrane; PVD posterior vitreous detachment; RD retinal detachment; DM diabetes mellitus; DR diabetic retinopathy; NPDR non-proliferative diabetic retinopathy; PDR proliferative diabetic retinopathy; CSME clinically significant macular edema; DME diabetic macular edema; dbh dot blot hemorrhages; CWS cotton wool spot; POAG primary open angle glaucoma; C/D cup-to-disc ratio; HVF humphrey visual field; GVF goldmann visual field; OCT optical coherence tomography; IOP intraocular pressure; BRVO Branch retinal vein occlusion; CRVO central retinal vein occlusion; CRAO central  retinal artery occlusion; BRAO branch retinal artery occlusion; RT retinal tear; SB scleral buckle; PPV pars plana vitrectomy; VH Vitreous hemorrhage; PRP panretinal laser photocoagulation; IVK intravitreal kenalog; VMT vitreomacular traction; MH Macular hole;  NVD neovascularization of the disc; NVE neovascularization elsewhere; AREDS age related eye disease study; ARMD age related macular degeneration; POAG primary open angle glaucoma; EBMD epithelial/anterior basement membrane dystrophy; ACIOL anterior chamber intraocular lens; IOL intraocular lens; PCIOL posterior chamber intraocular lens; Phaco/IOL phacoemulsification with intraocular lens placement; PRK photorefractive keratectomy; LASIK laser assisted in situ keratomileusis; HTN hypertension; DM diabetes mellitus; COPD chronic obstructive pulmonary disease

## 2024-01-05 DIAGNOSIS — J449 Chronic obstructive pulmonary disease, unspecified: Secondary | ICD-10-CM | POA: Diagnosis not present

## 2024-01-07 ENCOUNTER — Encounter (INDEPENDENT_AMBULATORY_CARE_PROVIDER_SITE_OTHER): Payer: Self-pay | Admitting: Ophthalmology

## 2024-01-07 ENCOUNTER — Ambulatory Visit (INDEPENDENT_AMBULATORY_CARE_PROVIDER_SITE_OTHER): Admitting: Ophthalmology

## 2024-01-07 DIAGNOSIS — H353221 Exudative age-related macular degeneration, left eye, with active choroidal neovascularization: Secondary | ICD-10-CM | POA: Diagnosis not present

## 2024-01-07 DIAGNOSIS — H43813 Vitreous degeneration, bilateral: Secondary | ICD-10-CM | POA: Diagnosis not present

## 2024-01-07 DIAGNOSIS — H04123 Dry eye syndrome of bilateral lacrimal glands: Secondary | ICD-10-CM

## 2024-01-07 DIAGNOSIS — H353213 Exudative age-related macular degeneration, right eye, with inactive scar: Secondary | ICD-10-CM | POA: Diagnosis not present

## 2024-01-07 DIAGNOSIS — H401331 Pigmentary glaucoma, bilateral, mild stage: Secondary | ICD-10-CM | POA: Diagnosis not present

## 2024-01-07 DIAGNOSIS — Z961 Presence of intraocular lens: Secondary | ICD-10-CM

## 2024-01-07 MED ORDER — BEVACIZUMAB CHEMO INJECTION 1.25MG/0.05ML SYRINGE FOR KALEIDOSCOPE
1.2500 mg | INTRAVITREAL | Status: AC | PRN
  Administered 2024-01-07: 1.25 mg via INTRAVITREAL

## 2024-01-08 ENCOUNTER — Encounter: Payer: Self-pay | Admitting: Radiation Oncology

## 2024-01-08 NOTE — Progress Notes (Signed)
 I have compared the patient's SBRT plan to her April PET imaging and findings are reassuring for treatment effects from previous SBRT to the RUL. There was a misunderstanding at tumor board that the RUL lesion had not yet been treated.    Patient has missed followups with me due to other health issues.  I have communicated my impressions to the team and she will continue to follow in pulmonology and med onc and see rad onc PRN.  Goals of care per patient and family may warrant less frequent surveillance imaging moving forward.   END OF TREATMENT SUMMARY FROM MAY 2024 to Right Upper Lung:  ==========DELIVERED PLANS==========  First Treatment Date: 2022-12-25 - Last Treatment Date: 2023-01-04   Plan Name: Lung_[id number] Site: Lung, Right Technique: SBRT/SRT-IMRT Mode: Photon Dose Per Fraction: 12 Gy Prescribed Dose (Delivered / Prescribed): 60 Gy / 60 Gy Prescribed Fxs (Delivered / Prescribed): 5 / 5    -----------------------------------  Marissa Dawes, MD

## 2024-01-22 ENCOUNTER — Other Ambulatory Visit: Payer: Self-pay

## 2024-01-23 ENCOUNTER — Inpatient Hospital Stay: Attending: Hematology | Admitting: Hematology

## 2024-01-23 ENCOUNTER — Other Ambulatory Visit: Payer: Self-pay

## 2024-01-23 DIAGNOSIS — Z170 Estrogen receptor positive status [ER+]: Secondary | ICD-10-CM

## 2024-01-23 DIAGNOSIS — C50411 Malignant neoplasm of upper-outer quadrant of right female breast: Secondary | ICD-10-CM

## 2024-01-23 DIAGNOSIS — C182 Malignant neoplasm of ascending colon: Secondary | ICD-10-CM

## 2024-01-23 DIAGNOSIS — R911 Solitary pulmonary nodule: Secondary | ICD-10-CM

## 2024-01-24 NOTE — Progress Notes (Unsigned)
 [facility]   Telephone:(336) [phone] Fax:(336) [phone]   Clinic Follow up Note   Patient Care Team: Victorio Grave, MD as PCP - General (Family Medicine) Maudine Sos, MD as PCP - Cardiology (Cardiology) Alane Hsu, RN as Oncology Nurse Navigator Auther Bo, RN as Oncology Nurse Navigator Caralyn Chandler, MD as Consulting Physician (General Surgery) Sonja Lucan, MD as Consulting Physician (Hematology) Colie Dawes, MD as Attending Physician (Radiation Oncology) 01/25/2024  I connected with Marissa Caldwell on 01/25/24 at  2:45 PM EDT by telephone and verified that I am speaking with the correct person using two identifiers.   I discussed the limitations, risks, security and privacy concerns of performing an evaluation and management service by telephone and the availability of in person appointments. I also discussed with the patient that there may be a patient responsible charge related to this service. The patient expressed understanding and agreed to proceed.   Patient's location:  Home  Provider's location:  Office    CHIEF COMPLAINT: f/u lung mass    CURRENT THERAPY: Adjuvant tamoxifen .  Oncology history Cancer of right colon (HCC) -pT2N0M0, stage I  -She was diagnosed in 09/2019 by colonoscopy with Dr Leonia Raman. -She underwent right colectomy on 12/15/19 with Dr. Alethea Andes. 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 Negative margins and LNs.  -restaging PET 06/13/21 was NED  Malignant neoplasm of upper-outer quadrant of right breast in female, estrogen receptor positive (HCC) pT1cN2aMx, ER/PR+, HER2-, Grade II -s/p right breast lumpectomy by Dr Alethea Andes on 06/19/19, path showed 2 cm mucinous carcinoma and three positive lymph nodes (two with ECE) plus one with micrometastasis. Margins negative. -she received adjuvant RT with Dr. Lurena Sally 08/05/19-09/03/19.  -She started Tamoxifen  in 03/2019  and held 05/2019-07/2019 for surgery.  -most recent MM 04/2023 was benign -From a breast cancer standpoint, she is stable.  -Continue Tamoxifen  and surveillance with yearly mammograms  Lung nodule -Patient has extensive history of smoking -She has multiple lung nodules/groundglass change on previous CT and PET scan since 07/2019, which has been stable until last scan in 10/2021 -Her CT chest without contrast from October 17, 2022 showed enlarging right upper lobe nodule, now measuring 1.8cm, more solid appearing, highly suspicious for malignancy.  He also has multiple other small groundglass nodules, up to 1.3 cm, most are stable from previous scan. - She received SBRT radiation by Dr. Lurena Sally in April 2024    Assessment & Plan Lung cancer She has a mass in the right upper lobe with low PET scan uptake (3.1) on recent PET, indicating a low likelihood of active malignancy. The mass is attributed to previous radiation treatment in 11/2022 and has been stable since August 2024. She is clinically stable with no cough or dyspnea. Given her age and clinical stability, monitoring is preferred over biopsy. - Monitor the lung mass with a follow-up scan in 8 months unless symptoms develop. - Schedule a follow-up appointment in 4 months to assess clinical status. - Educate her and family about the low likelihood of active cancer and the plan to monitor the mass.  Radiation-related lung changes The mass in the right upper lobe is attributed to radiation-related changes, not new cancer. The decision to monitor rather than biopsy is based on low PET scan uptake and her age.  Pneumonia due to RSV She developed pneumonia secondary to RSV infection, managed during hospitalization. - Ensure follow-up with the pulmonologist to assess for any residual effects of  the pneumonia.  Plan - Imaging has been reviewed in thoracic conference, and with Dr. Lurena Sally also. We will recommend continue monitoring, no biopsy  needed at this point. - She is scheduled to see pulmonologist next week - Lab and follow-up in 4 months. Will message Dr. Lurena Sally and Dr. Waylan Haggard to coordinate her care and a follow-up   SUMMARY OF ONCOLOGIC HISTORY: Oncology History Overview Note  Cancer Staging Cancer of right colon [facility]) Staging form: Colon and Rectum, AJCC 8th Edition - Pathologic stage from 12/15/2019: Stage I (pT2, pN0, cM0) - Signed by Sonja [place], MD on 01/08/2020  Malignant neoplasm of upper-outer quadrant of right breast in female, estrogen receptor positive (HCC) Staging form: Breast, AJCC 8th Edition - Clinical: Stage IIA (cT1c, cN1, cM0, G3, ER+, PR+, HER2-) - Signed by Sonja [place], MD on 04/09/2019 - Pathologic stage from 06/19/2019: Stage IB (pT1c, pN2a, cM0, G2, ER+, PR+, HER2-) - Signed by Sonja [place], MD on 07/03/2019    Malignant neoplasm of upper-outer quadrant of right breast in female, estrogen receptor positive (HCC)  03/19/2019 Mammogram   Diagnostic Mammogram 03/19/19  IMPRESSION The 2 cm distortion in the  right breast 9:30 position middle depth 3cm from the nipple is indeterminate. US  recommended.  There is also a 1.2cmx3.2cm enlarged right axillary LN highly suggestive of malignancy.     03/31/2019 Initial Biopsy   Diagnosis 03/31/19  1. Breast, right, needle core biopsy, 9:30 o'clock, mid depth, 3cmfn - INVASIVE DUCTAL CARCINOMA. - LYMPHOVASCULAR INVASION IS IDENTIFIED. - SEE COMMENT. 2. Lymph node, needle/core biopsy, right axilla - INVASIVE DUCTAL CARCINOMA. - SEE COMMENT.   03/31/2019 Receptors her2   1. PROGNOSTIC INDICATORS Results: IMMUNOHISTOCHEMICAL AND MORPHOMETRIC ANALYSIS PERFORMED MANUALLY The tumor cells are NEGATIVE for Her2 (0). Estrogen Receptor: 100%, POSITIVE, STRONG STAINING INTENSITY Progesterone Receptor: 10%, POSITIVE, STRONG STAINING INTENSITY Proliferation Marker Ki67: 10%  2. PROGNOSTIC INDICATORS Results: IMMUNOHISTOCHEMICAL AND MORPHOMETRIC ANALYSIS PERFORMED  MANUALLY Estrogen Receptor: 100%, POSITIVE, STRONG STAINING INTENSITY Progesterone Receptor: 0%, NEGATIVE Proliferation Marker Ki67: 40%   04/03/2019 Initial Diagnosis   Malignant neoplasm of upper-outer quadrant of right breast in female, estrogen receptor positive (HCC)   04/09/2019 Cancer Staging   Staging form: Breast, AJCC 8th Edition - Clinical: Stage IIA (cT1c, cN1, cM0, G3, ER+, PR+, HER2-) - Signed by Sonja [place], MD on 04/09/2019  Staging form: Breast, AJCC 8th Edition - Clinical: Stage IIA (cT1c, cN1, cM0, G3, ER+, PR+, HER2-) - Signed by Sonja [place], MD on 04/09/2019   03/2019 -  Anti-estrogen oral therapy   Tamoxifen  20mg  daily starting 03/2019. Held 05/30/19 -07/2019 for surgery.   06/19/2019 Surgery   RIGHT BREAST LUMPECTOMY WITH RADIOACTIVE SEED AND SENTINEL LYMPH NODE BIOPSY by Dr Alethea Andes  06/19/19    06/19/2019 Pathology Results   FINAL MICROSCOPIC DIAGNOSIS: 06/19/19  A. BREAST, RIGHT, LUMPECTOMY:  -  Mucinous carcinoma, Nottingham grade 2  -  Margins uninvolved by carcinoma (0.2 cm; medial margin)  -  Lymphovascular space invasion present  -  Previous biopsy site changes present  -  See oncology table and comment below   B. LYMPH NODE, RIGHT AXILLARY #1, SENTINEL, BIOPSY:  -  Metastatic carcinoma involving two lymph nodes (2/2)  -  Extracapsular extension present  -  See comment   C. BREAST, RIGHT MEDIAL MARGIN, EXCISION:  -  Residual invasive carcinoma  -  Usual ductal hyperplasia with calcifications  -  See comment   D. LYMPH NODE, RIGHT AXILLARY #2, SENTINEL, BIOPSY:  -  Metastatic carcinoma  involving one lymph node (1/1)   E. LYMPH NODE, RIGHT AXILLARY #1 , BIOPSY:  -  Metastatic carcinoma involving one lymph node (1/1)   F. LYMPH NODE, RIGHT AXILLARY #2, BIOPSY:  -  No carcinoma identified in one lymph node (0/1)    06/19/2019 Cancer Staging   Staging form: Breast, AJCC 8th Edition - Pathologic stage from 06/19/2019: Stage IB (pT1c, pN2a, cM0, G2, ER+,  PR+, HER2-) - Signed by Sonja [place], MD on 07/03/2019   07/22/2019 PET scan   IMPRESSION: 1. Low level hypermetabolism identified in the surgical beds of the right breast and right axilla. This is presumably related to the surgery. 2. Tiny right subpectoral lymph nodes are asymmetric in concerning for metastatic disease on CT imaging. No hypermetabolism on the PET study although size of these lymph nodes is below reliable resolution on PET imaging. 3. 10 mm short axis precarinal lymph node shows low level hypermetabolism. Metastatic involvement not excluded. 4. 11 mm ground-glass nodule in right upper lobe shows low level FDG accumulation. Well differentiated or low-grade neoplasm is a distinct concern. Close follow-up recommended. 5. Focal hypermetabolism in the right colon, potentially related to the ileocecal valve but incompletely characterized. Adenoma or carcinoma could have this appearance. Correlation with colorectal cancer screening history recommended. 6. Focal hypermetabolism identified in the region of the left pubic bone and adjacent sigmoid colon. No underlying bony or colonic lesion evident. Metastatic involvement of the left pubic bone not excluded. 7.  Aortic Atherosclerois (ICD10-170.0)     08/05/2019 - 09/03/2019 Radiation Therapy   Adjuvant RT with Dr Lurena Sally 08/05/19-09/03/19.    06/13/2021 PET scan   IMPRESSION: 1. Sub solid right upper lobe pulmonary nodule shows low level hypermetabolism. Neoplasm not excluded. 2. 11 mm ground-glass nodule superior segment left lower lobe shows very low level FDG accumulation. Findings may be infectious/inflammatory. As well differentiated in low-grade neoplasm can be poorly FDG avid, continued close attention recommended. 3. No evidence for hypermetabolic metastatic disease in the abdomen or pelvis. 4.  Aortic Atherosclerois (ICD10-170.0) 5.  Emphysema. (JWJ19-J47.9)   Cancer of right colon (HCC)  10/15/2019 Procedure    Colonoscopy by Dr Leonia Raman  IMPRESSION - Likely malignant tumor in the proximal ascending colon. Biopsied. Tattooed. - Severe diverticulosis in the sigmoid colon, in the descending colon, in the transverse colon and in the ascending colon. - Non-bleeding internal hemorrhoids.   10/15/2019 Initial Biopsy   Diagnosis Ascending Colon Polyp, mass - ADENOCARCINOMA. Microscopic Comment IHC for MMR will be reported separately. Results reported to Dr. Kavitha Nandigam on 10/16/2019. Intradepartmental consultation (Dr. Bernetta Brilliant).   12/15/2019 Initial Diagnosis   Cancer of right colon (HCC)   12/15/2019 Cancer Staging   Staging form: Colon and Rectum, AJCC 8th Edition - Pathologic stage from 12/15/2019: Stage I (pT2, pN0, cM0) - Signed by Sonja Erwin, MD on 01/08/2020   12/15/2019 Surgery   LAPAROSCOPIC ASSISTED RIGHT COLECTOMY by Dr Alethea Andes and Dr Andy Bannister   12/15/2019 Pathology Results   FINAL MICROSCOPIC DIAGNOSIS:   A. COLON, TERMINAL ILEUM AND RIGHT, COLECTOMY:  - Invasive moderately differentiated adenocarcinoma, 4.6 cm, involving  ascending colon  - Carcinoma invades into deeper muscularis propria but not beyond  - Resection margins are negative for carcinoma  - Negative for lymphovascular or perineural invasion  - Unremarkable appendix  - Nineteen lymph nodes, negative for carcinoma (0/19)  - See oncology table    12/15/2019 Genetic Testing   ADDENDUM:   Mismatch Repair Protein (IHC)  SUMMARY INTERPRETATION: ABNORMAL  There is loss of the major and minor MMR proteins MLH1 and PMS2. The  loss of expression may be secondary to promoter hyper-methylation, gene  mutation or other genetic event. BRAF mutation testing and/or MLH1  methylation testing is indicated. The presence of a BRAF mutation and/or  MLH1 hypermethylation is indicative of a sporadic-type tumor. The  absence of either BRAF mutation and/or presence of normal methylation  indicate the possible presence of a hereditary  germline mutation (e.g.  Lynch syndrome) and referral to genetic counseling is warranted. It is  recommended that the loss of protein expression be correlated with  molecular based MSI testing.   IHC EXPRESSION RESULTS  TEST           RESULT  MLH1:          LOSS OF NUCLEAR EXPRESSION  MSH2:          Preserved nuclear expression  MSH6:          Preserved nuclear expression  PMS2:          LOSS OF NUCLEAR EXPRESSION    12/29/2019 Imaging   CT AP W contrast  IMPRESSION: 1. Sigmoid diverticulosis without inflammation. Status post right colectomy. 2. Multiple small calcified uterine fibroids. 3. No acute abnormality seen in the abdomen or pelvis.   Aortic Atherosclerosis (ICD10-I70.0).     01/28/2020 Imaging   CT AP w contrast  IMPRESSION: 1. Moderate irregular wall thickening of the right colon with some areas of low attenuation. Findings could be due to cecal diverticulitis or other inflammatory or infectious colitis. I doubt that this represents recurrent colon cancer. 2. Status post partial right colectomy. No findings for locoregional adenopathy or metastatic disease. 3. Stable advanced atherosclerotic calcifications involving the aorta and branch vessels. 4. Stable benign-appearing central hepatic cysts. 5. Stable calcified uterine fibroids. 6. Aortic atherosclerosis.   Aortic Atherosclerosis (ICD10-I70.0).   04/30/2020 Imaging   CT Chest  IMPRESSION: 1. Ground-glass nodule in the RIGHT upper lobe measuring 1.4 x 0.9 cm previously 1.4 x 0.9 cm. 2. Ground-glass nodule in the LEFT upper lobe measuring 11 x 12 mm, spiculated margins and mild fissural distortion, accompanying features of this nodule. Findings are concerning for for multifocal bronchogenic neoplasm given persistence. 3. Stable size of small nodes along the RIGHT paratracheal chain largest approximately 9 mm. 4. Stable hepatic hemangioma. 5. Dilation of the ascending aorta to 3.7 cm, similar to  previous exams. 6. Three-vessel coronary artery disease. 7. Aortic atherosclerosis.   Aortic Atherosclerosis (ICD10-I70.0).     02/01/2021 Imaging   CT CAP  IMPRESSION: No evidence of recurrent or metastatic carcinoma within the abdomen or pelvis. No evidence of thoracic metastatic disease.   Stable hepatic hemangioma.   Stable small calcified uterine fibroids.   Colonic diverticulosis. No radiographic evidence of diverticulitis.   Sub-solid pulmonary nodule in anterior right upper lobe shows increased solid component, suspicious for primary lung adenocarcinoma. PET-CT is recommended for further evaluation.   Stable 11 mm ground-glass nodule in superior segment of left lower lobe. Recommend continued attention on follow-up imaging, and this could also be assessed by PET-CT.   Aortic Atherosclerosis (ICD10-I70.0) and Emphysema (ICD10-J43.9).     06/13/2021 PET scan   IMPRESSION: 1. Sub solid right upper lobe pulmonary nodule shows low level hypermetabolism. Neoplasm not excluded. 2. 11 mm ground-glass nodule superior segment left lower lobe shows very low level FDG accumulation. Findings may be infectious/inflammatory. As well differentiated in low-grade neoplasm can be poorly FDG avid, continued close  attention recommended. 3. No evidence for hypermetabolic metastatic disease in the abdomen or pelvis. 4.  Aortic Atherosclerois (ICD10-170.0) 5.  Emphysema. (ICD10-J43.9)   10/17/2022 Imaging    IMPRESSION: 1. Progressive enlargement and spiculation of the previously demonstrated right upper lobe nodule, highly suspicious for malignancy, likely primary bronchogenic carcinoma. 2. Additional ground-glass and part solid nodules in the lingula and superior segment of the left lower lobe are grossly stable from previous CT of 2022. Continued follow-up recommended. 3. No typical evidence of metastatic disease. 4. Stable postsurgical changes in the right breast and right  axilla. 5. Aortic Atherosclerosis (ICD10-I70.0) and Emphysema (ICD10-J43.9).       Discussed the use of AI scribe software for clinical note transcription with the patient, who gave verbal consent to proceed.  History of Present Illness Genetta Fiero Forge is a 89 year old female with a lung mass who presents for follow-up. She is not accompanied by anyone, but her son Aron Lard is involved in the conversation. She was referred by Dr. Camilo Cella for evaluation of RSV and pneumonia.  She is following up on a lung mass in the right upper lobe, previously treated with radiation in April 2024. A recent PET scan showed a mass in the right upper lobe with a low uptake of 3.1, slightly above normal. She has improved since her last visit over a month ago, requiring oxygen  only at night. No cough or shortness of breath is present.     REVIEW OF SYSTEMS:   Constitutional: Denies fevers, chills or abnormal weight loss Eyes: Denies blurriness of vision Ears, nose, mouth, throat, and face: Denies mucositis or sore throat Respiratory: Denies cough, dyspnea or wheezes Cardiovascular: Denies palpitation, chest discomfort or lower extremity swelling Gastrointestinal:  Denies nausea, heartburn or change in bowel habits Skin: Denies abnormal skin rashes Lymphatics: Denies new lymphadenopathy or easy bruising Neurological:Denies numbness, tingling or new weaknesses Behavioral/Psych: Mood is stable, no new changes  All other systems were reviewed with the patient and are negative.  MEDICAL HISTORY:  Past Medical History:  Diagnosis Date   Anxiety    Arthritis    Breast cancer (HCC)    CAD (coronary artery disease)    a. 03/2017: 95% LCx stenosis --> PCI/DES placed   Cataract    Chest pain 03/27/2017   Chronic diastolic heart failure (HCC) 06/25/2017   Chronic lower back pain    Colon cancer (HCC) 11/2019   s/p colectomy   COPD (chronic obstructive pulmonary disease) (HCC)    Coronary artery disease     Diverticulitis    Diverticulosis    GERD (gastroesophageal reflux disease)    Glaucoma    Heart failure, type unknown (HCC) 03/11/2017   History of radiation therapy 08/05/19- 09/03/19   Right Breast/ SCV 16 fractions of 2.66 Gy each for a total of 42.56 Gy. Right breast boost 4 fractions of 2 Gy each to total 8 Gy.    Hyperlipidemia 03/11/2017   Hypertension    Hypothyroidism    Macular degeneration    wet and dry per pt's son   Personal history of chemotherapy    Personal history of radiation therapy    Pneumonia X 1   "years and years ago" (03/26/2017)   Shortness of breath 03/11/2017   Status post dilation of esophageal narrowing     SURGICAL HISTORY: Past Surgical History:  Procedure Laterality Date   BILATERAL OOPHORECTOMY Bilateral    BREAST LUMPECTOMY Right 06/19/2019   BREAST LUMPECTOMY WITH RADIOACTIVE SEED AND SENTINEL  LYMPH NODE BIOPSY Right 06/19/2019   Procedure: RIGHT BREAST LUMPECTOMY WITH RADIOACTIVE SEED AND SENTINEL LYMPH NODE BIOPSY;  Surgeon: Caralyn Chandler, MD;  Location: [facility] OR;  Service: General;  Laterality: Right;   CATARACT EXTRACTION     CATARACT EXTRACTION W/ INTRAOCULAR LENS  IMPLANT, BILATERAL Bilateral    COLOSTOMY     Greater than 10 yrs   CORONARY ANGIOPLASTY WITH STENT PLACEMENT  03/26/2017   LAPAROSCOPIC RIGHT COLECTOMY Right 12/15/2019   Procedure: LAPAROSCOPIC ASSISTED RIGHT COLECTOMY;  Surgeon: Caralyn Chandler, MD;  Location: [facility] OR;  Service: General;  Laterality: Right;   LEFT HEART CATH AND CORONARY ANGIOGRAPHY N/A 03/26/2017   Procedure: Left Heart Cath and Coronary Angiography;  Surgeon: Arty Binning, MD;  Location: [facility] INVASIVE CV LAB;  Service: Cardiovascular;  Laterality: N/A;   NERVE SURGERY     "lump on my sciatic nerve was removed years ago"   SHOULDER OPEN ROTATOR CUFF REPAIR Right    THYROIDECTOMY, PARTIAL     TONSILLECTOMY     UPPER GASTROINTESTINAL ENDOSCOPY     > ten yrs    I have reviewed the social history and family history  with the patient and they are unchanged from previous note.  ALLERGIES:  is allergic to lipitor [atorvastatin ].  MEDICATIONS:  Current Outpatient Medications  Medication Sig Dispense Refill   albuterol  (PROVENTIL  HFA;VENTOLIN  HFA) 108 (90 Base) MCG/ACT inhaler Inhale 2 puffs into the lungs every 6 (six) hours as needed for wheezing or shortness of breath.     amLODipine  (NORVASC ) 5 MG tablet Take 1 tablet (5 mg total) by mouth daily. 30 tablet 1   arformoterol  (BROVANA ) 15 MCG/2ML NEBU Take 2 mLs (15 mcg total) by nebulization 2 (two) times daily. 120 mL 0   brimonidine -timolol  (COMBIGAN) 0.2-0.5 % ophthalmic solution Apply to eye.     budesonide  (PULMICORT ) 0.25 MG/2ML nebulizer solution Take 2 mLs (0.25 mg total) by nebulization 2 (two) times daily. 60 mL 12   Calcium  Carb-Cholecalciferol  (CALCIUM  + D3) 600-200 MG-UNIT TABS Take 1 tablet by mouth daily.     Cholecalciferol  (VITAMIN D3) 2000 units capsule Take 4,000 Units by mouth daily.     Coenzyme Q10 (COQ10) 100 MG CAPS Take 100 mg by mouth daily.     diclofenac  (CATAFLAM ) 50 MG tablet Take 1 tablet (50 mg total) by mouth daily after breakfast. 30 tablet 3   famotidine  (PEPCID ) 20 MG tablet Take 20 mg by mouth daily.     gabapentin  (NEURONTIN ) 100 MG capsule Take 1 capsule (100 mg total) by mouth at bedtime. 30 capsule 3   guaiFENesin  (MUCINEX ) 600 MG 12 hr tablet Take 2 tablets (1,200 mg total) by mouth 2 (two) times daily. 30 tablet 0   ipratropium-albuterol  (DUONEB) 0.5-2.5 (3) MG/3ML SOLN Take 3 mLs by nebulization every 6 (six) hours as needed. 360 mL 0   latanoprost  (XALATAN ) 0.005 % ophthalmic solution Place 1 drop into both eyes at bedtime.     levothyroxine  (SYNTHROID , LEVOTHROID) 100 MCG tablet Take 100 mcg by mouth daily before breakfast.   0   LORazepam  (ATIVAN ) 0.5 MG tablet Take 0.25-0.5 mg by mouth every 8 (eight) hours as needed for anxiety.     LUTEIN  PO Take 1 tablet by mouth daily.     Multiple Vitamin (MULTIVITAMIN)  tablet Take 1 tablet by mouth daily.     nitroGLYCERIN  (NITROSTAT ) 0.4 MG SL tablet Place 1 tablet (0.4 mg total) under the tongue every 5 (five) minutes as needed  for chest pain. 25 tablet 3   Omega-3 Fatty Acids (FISH OIL PO) Take 1 capsule by mouth daily.     pravastatin  (PRAVACHOL ) 40 MG tablet TAKE 1 TABLET BY MOUTH DAILY GENERIC EQUIVALENT FOR PRAVACHOL  90 tablet 0   solifenacin (VESICARE) 5 MG tablet Take 5 mg by mouth daily.     tamoxifen  (NOLVADEX ) 20 MG tablet TAKE 1 TABLET BY MOUTH ONCE DAILY. APPOINTMENT REQUIRED FOR FUTURE REFILLS 90 tablet 3   traMADol  (ULTRAM ) 50 MG tablet Take 0.5-1 tablets (25-50 mg total) by mouth every 8 (eight) hours as needed. 30 tablet 0   No current facility-administered medications for this visit.    PHYSICAL EXAMINATION: Not performed   LABORATORY DATA:  I have reviewed the data as listed    Latest Ref Rng & Units 11/22/2023   10:50 AM 10/11/2023    9:26 AM 10/09/2023    6:58 AM  CBC  WBC 4.0 - 10.5 K/uL 5.4  7.5  10.0   Hemoglobin 12.0 - 15.0 g/dL 29.5  62.1  30.8   Hematocrit 36.0 - 46.0 % 30.1  30.9  33.7   Platelets 150 - 400 K/uL 221  179  183         Latest Ref Rng & Units 11/22/2023   10:50 AM 10/12/2023    9:26 AM 10/11/2023    9:26 AM  CMP  Glucose 70 - 99 mg/dL 98  657  846   BUN 8 - 23 mg/dL 24  10  15    Creatinine 0.44 - 1.00 mg/dL 9.62  9.52  8.41   Sodium 135 - 145 mmol/L 138  132  132   Potassium 3.5 - 5.1 mmol/L 4.4  4.5  3.4   Chloride 98 - 111 mmol/L 104  103  100   CO2 22 - 32 mmol/L 29  21  21    Calcium  8.9 - 10.3 mg/dL 9.0  8.1  7.9   Total Protein 6.5 - 8.1 g/dL 6.7     Total Bilirubin 0.0 - 1.2 mg/dL 0.5     Alkaline Phos 38 - 126 U/L 41     AST 15 - 41 U/L 18     ALT 0 - 44 U/L 8         RADIOGRAPHIC STUDIES: I have personally reviewed the radiological images as listed and agreed with the findings in the report. No results found.     I discussed the assessment and treatment plan with the patient. The  patient was provided an opportunity to ask questions and all were answered. The patient agreed with the plan and demonstrated an understanding of the instructions.   The patient was advised to call back or seek an in-person evaluation if the symptoms worsen or if the condition fails to improve as anticipated.  I provided 25 minutes of non face-to-face telephone visit time during this encounter, including review of chart and various tests results, discussions about plan of care and coordination of care plan.    Sonja [place], MD 01/25/24

## 2024-01-25 NOTE — Assessment & Plan Note (Signed)
 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 Margins negative. -she received adjuvant RT with Dr. Basilio Cairo 08/05/19-09/03/19.  -She started Tamoxifen in 03/2019 and held 05/2019-07/2019 for surgery.  -most recent MM 04/2023 was benign -From a breast cancer standpoint, she is stable.  -Continue Tamoxifen and surveillance with yearly mammograms

## 2024-01-25 NOTE — Assessment & Plan Note (Signed)
-  Patient has extensive history of smoking -She has multiple lung nodules/groundglass change on previous CT and PET scan since 07/2019, which has been stable until last scan in 10/2021 -Her CT chest without contrast from October 17, 2022 showed enlarging right upper lobe nodule, now measuring 1.8cm, more solid appearing, highly suspicious for malignancy.  He also has multiple other small groundglass nodules, up to 1.3 cm, most are stable from previous scan. - She received SBRT radiation by Dr. Lurena Sally in April 2024

## 2024-01-25 NOTE — Assessment & Plan Note (Signed)
-  pT2N0M0, stage I  -She was diagnosed in 09/2019 by colonoscopy with Dr Silverio Decamp. -She underwent right colectomy on 12/15/19 with Dr. Marlou Starks. 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 Negative margins and LNs.  -restaging PET 06/13/21 was NED

## 2024-01-28 ENCOUNTER — Other Ambulatory Visit: Payer: Self-pay

## 2024-01-30 ENCOUNTER — Ambulatory Visit: Admitting: Pulmonary Disease

## 2024-01-30 ENCOUNTER — Encounter: Payer: Self-pay | Admitting: Pulmonary Disease

## 2024-01-30 VITALS — BP 134/78 | HR 57 | Ht 70.0 in | Wt 128.4 lb

## 2024-01-30 DIAGNOSIS — J449 Chronic obstructive pulmonary disease, unspecified: Secondary | ICD-10-CM | POA: Diagnosis not present

## 2024-01-30 DIAGNOSIS — Z87891 Personal history of nicotine dependence: Secondary | ICD-10-CM | POA: Diagnosis not present

## 2024-01-30 DIAGNOSIS — R911 Solitary pulmonary nodule: Secondary | ICD-10-CM | POA: Diagnosis not present

## 2024-01-30 NOTE — Patient Instructions (Signed)
 VISIT SUMMARY:  Today, you were seen for an evaluation of your respiratory status and medication management. You were accompanied by your son. We discussed your history of COPD, recent symptoms, and your current medication regimen. We also reviewed your history of lung nodule post-radiation, colon cancer, breast cancer, and macular degeneration.  YOUR PLAN:  -CHRONIC OBSTRUCTIVE PULMONARY DISEASE (COPD): COPD is a chronic lung condition that makes it hard to breathe. You will continue using your albuterol  inhaler as needed. We will order an overnight oximetry test to assess your need for nocturnal oxygen . If the results are normal, we may consider discontinuing the nocturnal oxygen .  -LUNG NODULE POST-RADIATION: A lung nodule is a small growth in the lung. Your right upper lobe nodule was treated with radiation in April 2024, and a recent PET scan showed likely post-treatment changes with no evidence of malignancy. We will order a follow-up CT scan in six months to monitor the nodule.   INSTRUCTIONS:  Please continue using your albuterol  inhaler as needed. We will schedule an overnight oximetry test to assess your need for nocturnal oxygen . If the results are normal, we may consider discontinuing the nocturnal oxygen . Additionally, we will order a follow-up CT scan in six months to monitor your lung nodule. Please continue with your current medications and daily exercises. Follow up with us  if you experience any new or worsening symptoms.

## 2024-01-30 NOTE — Progress Notes (Signed)
 Marissa Caldwell    [id number]    October 12, 1931  Primary Care Physician:White, Adah Acron, MD  Referring Physician: Victorio Grave, MD [phone] [place],  [place] [address]  Chief complaint: Consult for COPD evaluation, lung nodule  HPI: 89 y.o. who  has a past medical history of Anxiety, Arthritis, Breast cancer (HCC), CAD (coronary artery disease), Cataract, Chest pain (03/27/2017), Chronic diastolic heart failure (HCC) (03/27/5783), Chronic lower back pain, Colon cancer (HCC) (11/2019), COPD (chronic obstructive pulmonary disease) (HCC), Coronary artery disease, Diverticulitis, Diverticulosis, GERD (gastroesophageal reflux disease), Glaucoma, Heart failure, type unknown (HCC) (03/11/2017), History of radiation therapy (08/05/19- 09/03/19), Hyperlipidemia (03/11/2017), Hypertension, Hypothyroidism, Macular degeneration, Personal history of chemotherapy, Personal history of radiation therapy, Pneumonia (X 1), Shortness of breath (03/11/2017), and Status post dilation of esophageal narrowing.  Discussed the use of AI scribe software for clinical note transcription with the patient, who gave verbal consent to proceed.  History of Present Illness Marissa Caldwell is a 89 year old female with COPD who presents for evaluation of her respiratory status and medication management. She is accompanied by her son.  She experiences intermittent shortness of breath, which she describes as sometimes uncomfortable. There has been a recurrence of symptoms after a period of improvement. She uses an inhaler in the morning and at night, and occasionally uses a rescue inhaler. Previously, she used a nebulizer but has discontinued it. She also uses oxygen  at night, which was previously used both day and night. She recalls a time when she could hardly walk to her front door, but now she is able to walk more comfortably.  She was hospitalized in February 2025 for community-acquired pneumonia and RSV  infection, treated with ceftriaxone , doxycycline , and Augmentin . Blood cultures were negative. Her current medications include an albuterol  inhaler, which she uses as needed. She has tried other inhalers like Dulera  and Trelegy but had difficulty using them. She performs exercises daily, which she believes help her condition.  She has a history of colon cancer in 2021, treated with a right colectomy, and breast cancer in 2020, treated with lumpectomy, lymph node dissection, and adjuvant radiotherapy. She is currently on tamoxifen .She has a history of a right upper lobe nodule treated with radiation in April 2024, and a recent PET scan in April 2025 showed low-level uptake, likely post-treatment changes.  She quit smoking in 2021 after many years of smoking, though it was more recreational in recent years. She lives alone with a poodle and is retired. 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  Outpatient Encounter Medications as of 01/30/2024  Medication Sig   albuterol  (PROVENTIL  HFA;VENTOLIN  HFA) 108 (90 Base) MCG/ACT inhaler Inhale 2 puffs into the lungs every 6 (six) hours as needed for wheezing or shortness of breath.   amLODipine  (NORVASC ) 5 MG tablet Take 1 tablet (5 mg total) by mouth daily.   arformoterol  (BROVANA ) 15 MCG/2ML NEBU Take 2 mLs (15 mcg total) by nebulization 2 (two) times daily.   brimonidine -timolol  (COMBIGAN) 0.2-0.5 % ophthalmic solution Apply to eye.   budesonide  (PULMICORT ) 0.25 MG/2ML nebulizer solution Take 2 mLs (0.25 mg total) by nebulization 2 (two) times daily.   Calcium  Carb-Cholecalciferol  (CALCIUM  + D3) 600-200 MG-UNIT TABS Take 1 tablet by mouth daily.   Cholecalciferol  (VITAMIN D3) 2000 units capsule Take 4,000 Units by mouth daily.   Coenzyme Q10 (COQ10) 100 MG CAPS Take 100 mg by mouth daily.  diclofenac  (CATAFLAM ) 50 MG tablet Take 1 tablet (50 mg total) by mouth daily after breakfast.   famotidine  (PEPCID ) 20  MG tablet Take 20 mg by mouth daily.   gabapentin  (NEURONTIN ) 100 MG capsule Take 1 capsule (100 mg total) by mouth at bedtime.   guaiFENesin  (MUCINEX ) 600 MG 12 hr tablet Take 2 tablets (1,200 mg total) by mouth 2 (two) times daily.   ipratropium-albuterol  (DUONEB) 0.5-2.5 (3) MG/3ML SOLN Take 3 mLs by nebulization every 6 (six) hours as needed.   latanoprost  (XALATAN ) 0.005 % ophthalmic solution Place 1 drop into both eyes at bedtime.   levothyroxine  (SYNTHROID , LEVOTHROID) 100 MCG tablet Take 100 mcg by mouth daily before breakfast.    LORazepam  (ATIVAN ) 0.5 MG tablet Take 0.25-0.5 mg by mouth every 8 (eight) hours as needed for anxiety.   LUTEIN  PO Take 1 tablet by mouth daily.   Multiple Vitamin (MULTIVITAMIN) tablet Take 1 tablet by mouth daily.   nitroGLYCERIN  (NITROSTAT ) 0.4 MG SL tablet Place 1 tablet (0.4 mg total) under the tongue every 5 (five) minutes as needed for chest pain.   Omega-3 Fatty Acids (FISH OIL PO) Take 1 capsule by mouth daily.   pravastatin  (PRAVACHOL ) 40 MG tablet TAKE 1 TABLET BY MOUTH DAILY GENERIC EQUIVALENT FOR PRAVACHOL    solifenacin (VESICARE) 5 MG tablet Take 5 mg by mouth daily.   tamoxifen  (NOLVADEX ) 20 MG tablet TAKE 1 TABLET BY MOUTH ONCE DAILY. APPOINTMENT REQUIRED FOR FUTURE REFILLS   traMADol  (ULTRAM ) 50 MG tablet Take 0.5-1 tablets (25-50 mg total) by mouth every 8 (eight) hours as needed.   No facility-administered encounter medications on file as of 01/30/2024.    Allergies as of 01/30/2024 - Review Complete 01/30/2024  Allergen Reaction Noted   Lipitor [atorvastatin ]  09/05/2017    Past Medical History:  Diagnosis Date   Anxiety    Arthritis    Breast cancer (HCC)    CAD (coronary artery disease)    a. 03/2017: 95% LCx stenosis --> PCI/DES placed   Cataract    Chest pain 03/27/2017   Chronic diastolic heart failure (HCC) 06/25/2017   Chronic lower back pain    Colon cancer (HCC) 11/2019   s/p colectomy   COPD (chronic obstructive  pulmonary disease) (HCC)    Coronary artery disease    Diverticulitis    Diverticulosis    GERD (gastroesophageal reflux disease)    Glaucoma    Heart failure, type unknown (HCC) 03/11/2017   History of radiation therapy 08/05/19- 09/03/19   Right Breast/ SCV 16 fractions of 2.66 Gy each for a total of 42.56 Gy. Right breast boost 4 fractions of 2 Gy each to total 8 Gy.    Hyperlipidemia 03/11/2017   Hypertension    Hypothyroidism    Macular degeneration    wet and dry per pt's son   Personal history of chemotherapy    Personal history of radiation therapy    Pneumonia X 1   years and years ago (03/26/2017)   Shortness of breath 03/11/2017   Status post dilation of esophageal narrowing     Past Surgical History:  Procedure Laterality Date   BILATERAL OOPHORECTOMY Bilateral    BREAST LUMPECTOMY Right 06/19/2019   BREAST LUMPECTOMY WITH RADIOACTIVE SEED AND SENTINEL LYMPH NODE BIOPSY Right 06/19/2019   Procedure: RIGHT BREAST LUMPECTOMY WITH RADIOACTIVE SEED AND SENTINEL LYMPH NODE BIOPSY;  Surgeon: Caralyn Chandler, MD;  Location: [facility] OR;  Service: General;  Laterality: Right;   CATARACT EXTRACTION  CATARACT EXTRACTION W/ INTRAOCULAR LENS  IMPLANT, BILATERAL Bilateral    COLOSTOMY     Greater than 10 yrs   CORONARY ANGIOPLASTY WITH STENT PLACEMENT  03/26/2017   LAPAROSCOPIC RIGHT COLECTOMY Right 12/15/2019   Procedure: LAPAROSCOPIC ASSISTED RIGHT COLECTOMY;  Surgeon: Caralyn Chandler, MD;  Location: [facility] OR;  Service: General;  Laterality: Right;   LEFT HEART CATH AND CORONARY ANGIOGRAPHY N/A 03/26/2017   Procedure: Left Heart Cath and Coronary Angiography;  Surgeon: Arty Binning, MD;  Location: [facility] INVASIVE CV LAB;  Service: Cardiovascular;  Laterality: N/A;   NERVE SURGERY     lump on my sciatic nerve was removed years ago   SHOULDER OPEN ROTATOR CUFF REPAIR Right    THYROIDECTOMY, PARTIAL     TONSILLECTOMY     UPPER GASTROINTESTINAL ENDOSCOPY     > ten yrs    Family History   Problem Relation Age of Onset   CAD Mother    Cataracts Mother    Coronary artery disease Father    Leukemia Sister    Amblyopia Neg Hx    Blindness Neg Hx    Diabetes Neg Hx    Glaucoma Neg Hx    Macular degeneration Neg Hx    Retinal detachment Neg Hx    Strabismus Neg Hx    Retinitis pigmentosa Neg Hx    Colon cancer Neg Hx    Colon polyps Neg Hx    Esophageal cancer Neg Hx    Rectal cancer Neg Hx    Stomach cancer Neg Hx     Social History   Socioeconomic History   Marital status: Widowed    Spouse name: Not on file   Number of children: 1   Years of education: Not on file   Highest education level: Not on file  Occupational History   Occupation: retired  Tobacco Use   Smoking status: Former    Current packs/day: 0.50    Average packs/day: 0.5 packs/day for 60.0 years (30.0 ttl pk-yrs)    Types: Cigarettes   Smokeless tobacco: Never  Vaping Use   Vaping status: Never Used  Substance and Sexual Activity   Alcohol use: Not Currently    Comment: twice a month   Drug use: No   Sexual activity: Not Currently  Other Topics Concern   Not on file  Social History Narrative   Not on file   Social Drivers of Health   Financial Resource Strain: Not on file  Food Insecurity: No Food Insecurity (10/08/2023)   Hunger Vital Sign    Worried About Running Out of Food in the Last Year: Never true    Ran Out of Food in the Last Year: Never true  Transportation Needs: No Transportation Needs (10/08/2023)   PRAPARE - Administrator, Civil Service (Medical): No    Lack of Transportation (Non-Medical): No  Physical Activity: Not on file  Stress: Not on file  Social Connections: Moderately Integrated (10/14/2023)   Social Connection and Isolation Panel [NHANES]    Frequency of Communication with Friends and Family: More than three times a week    Frequency of Social Gatherings with Friends and Family: More than three times a week    Attends Religious Services:  More than 4 times per year    Active Member of [employer] or Organizations: Yes    Attends Banker Meetings: More than 4 times per year    Marital Status: Widowed  Intimate Partner Violence: Not At Risk (  10/08/2023)   Humiliation, Afraid, Rape, and Kick questionnaire    Fear of Current or Ex-Partner: No    Emotionally Abused: No    Physically Abused: No    Sexually Abused: No    Review of systems: Review of Systems  Constitutional: Negative for fever and chills.  HENT: Negative.   Eyes: Negative for blurred vision.  Respiratory: as per HPI  Cardiovascular: Negative for chest pain and palpitations.  Gastrointestinal: Negative for vomiting, diarrhea, blood per rectum. Genitourinary: Negative for dysuria, urgency, frequency and hematuria.  Musculoskeletal: Negative for myalgias, back pain and joint pain.  Skin: Negative for itching and rash.  Neurological: Negative for dizziness, tremors, focal weakness, seizures and loss of consciousness.  Endo/Heme/Allergies: Negative for environmental allergies.  Psychiatric/Behavioral: Negative for depression, suicidal ideas and hallucinations.  All other systems reviewed and are negative.  Physical Exam: Blood pressure 134/78, pulse (!) 57, height 5' 10 (1.778 m), weight 128 lb 6.4 oz (58.2 kg), SpO2 98%. Gen:      No acute distress, frail, elderly HEENT:  EOMI, sclera anicteric Neck:     No masses; no thyromegaly Lungs:    Clear to auscultation bilaterally; normal respiratory effort CV:         Regular rate and rhythm; no murmurs Abd:      + bowel sounds; soft, non-tender; no palpable masses, no distension Ext:    No edema; adequate peripheral perfusion Skin:      Warm and dry; no rash Neuro: alert and oriented x 3  Data Reviewed: Imaging: CT angio 10/08/2023-no pulmonary embolism, ill-defined masslike opacity in the anterior right upper lobe measuring 3 cm  PET scan 12/12/2023-ill-defined masslike opacity in the right upper lobe  has low-level uptake, SUV 3.1, emphysema I have reviewed the images personally.  PFTs:  Labs:  Assessment & Plan Chronic Obstructive Pulmonary Disease (COPD) COPD diagnosis is uncertain due to lack of significant emphysema on CT. She experiences coordination issues with inhaler use and uses nocturnal oxygen , though daytime oxygen  levels are adequate. Pulmonary function tests may are difficult due to difficulty following instructions. Albuterol  inhaler is used as needed, and nebulizer use is not feasible due to caregiver limitations - Continue albuterol  inhaler as needed - Order overnight oximetry to assess need for nocturnal oxygen  - Consider discontinuing nocturnal oxygen  if overnight oximetry is normal  Lung nodule post-radiation Right upper lobe nodule treated with radiation in April 2024. Recent PET scan in April 2025 showed low-level uptake, likely post-treatment changes. No evidence of malignancy per oncologist. Follow-up CT scan is planned to monitor for any new developments.  Discussed with patient and daughter if they want to continue monitoring and if they want aggressive care given frailty, advanced age - Order follow-up CT scan in six months to monitor lung nodule.  They will decide when it is closer to that time if they want to go ahead with the scan  Colon cancer Colon cancer with right colectomy in 2021. No current issues.  Breast cancer Right breast cancer with lumpectomy, lymph node dissection, and adjuvant radiotherapy in 2020. Currently on tamoxifen . No current issues.n.  Recommendations: Overnight oximetry Continue albuterol  as needed CT scan in 6 months  Phyllis Breeze MD [facility] 01/30/2024, 1:23 PM  CC: Victorio Grave, MD

## 2024-01-31 ENCOUNTER — Telehealth: Payer: Self-pay | Admitting: Pulmonary Disease

## 2024-01-31 DIAGNOSIS — J449 Chronic obstructive pulmonary disease, unspecified: Secondary | ICD-10-CM

## 2024-01-31 NOTE — Telephone Encounter (Signed)
 Please update how you want ONO completed on the order so I can send out to DME   thanks

## 2024-02-01 NOTE — Telephone Encounter (Signed)
 She needs overnight oximetry on room air

## 2024-02-01 NOTE — Telephone Encounter (Signed)
 I have placed new order for ONO on RA  Nothing further needed

## 2024-02-05 DIAGNOSIS — J449 Chronic obstructive pulmonary disease, unspecified: Secondary | ICD-10-CM | POA: Diagnosis not present

## 2024-02-08 DIAGNOSIS — H353231 Exudative age-related macular degeneration, bilateral, with active choroidal neovascularization: Secondary | ICD-10-CM | POA: Diagnosis not present

## 2024-02-08 DIAGNOSIS — H401331 Pigmentary glaucoma, bilateral, mild stage: Secondary | ICD-10-CM | POA: Diagnosis not present

## 2024-02-08 DIAGNOSIS — M810 Age-related osteoporosis without current pathological fracture: Secondary | ICD-10-CM | POA: Diagnosis not present

## 2024-02-26 DIAGNOSIS — N390 Urinary tract infection, site not specified: Secondary | ICD-10-CM | POA: Diagnosis not present

## 2024-02-26 DIAGNOSIS — R399 Unspecified symptoms and signs involving the genitourinary system: Secondary | ICD-10-CM | POA: Diagnosis not present

## 2024-02-28 NOTE — Telephone Encounter (Signed)
 Copied from CRM [id number]. Topic: Referral - Status >> Feb 28, 2024 11:17 AM Nathanel DEL wrote: Reason for CRM: son Prentice calling to follow uo n oximetry test ordered by Dr Theophilus.  They have not heard anything,  please advise Fred phone number [phone]  Can we follow up on this?

## 2024-02-29 NOTE — Telephone Encounter (Signed)
 Message from DME:   Marissa Caldwell, Marissa Caldwell; Tucker, Dolanda; Sheree, Merilee They tried calling 6/18,6/23 and 6/25 and left VM and sent a message by text on 6/23. Patient or pt's family can call in at [phone] to get this scheduled.

## 2024-03-03 NOTE — Progress Notes (Shared)
 [facility] - Clinic Note  03/17/2024     CHIEF COMPLAINT Patient presents for No chief complaint on file.    HISTORY OF PRESENT ILLNESS: Marissa Caldwell is a 89 y.o. female who presents to the clinic today for:     Pt states vision is stable, she can see colors better than before  Referring physician:  Teresa Channel, MD [phone] [place],  [place] [address]  HISTORICAL INFORMATION:   Selected notes from the MEDICAL RECORD NUMBER Referred by Dr. Medford Ferrier for concern of exudative ARMD OD   CURRENT MEDICATIONS: Current Outpatient Medications (Ophthalmic Drugs)  Medication Sig   brimonidine -timolol  (COMBIGAN) 0.2-0.5 % ophthalmic solution Apply to eye.   latanoprost  (XALATAN ) 0.005 % ophthalmic solution Place 1 drop into both eyes at bedtime.   No current facility-administered medications for this visit. (Ophthalmic Drugs)   Current Outpatient Medications (Other)  Medication Sig   albuterol  (PROVENTIL  HFA;VENTOLIN  HFA) 108 (90 Base) MCG/ACT inhaler Inhale 2 puffs into the lungs every 6 (six) hours as needed for wheezing or shortness of breath.   amLODipine  (NORVASC ) 5 MG tablet Take 1 tablet (5 mg total) by mouth daily.   arformoterol  (BROVANA ) 15 MCG/2ML NEBU Take 2 mLs (15 mcg total) by nebulization 2 (two) times daily.   budesonide  (PULMICORT ) 0.25 MG/2ML nebulizer solution Take 2 mLs (0.25 mg total) by nebulization 2 (two) times daily.   Calcium  Carb-Cholecalciferol  (CALCIUM  + D3) 600-200 MG-UNIT TABS Take 1 tablet by mouth daily.   Cholecalciferol  (VITAMIN D3) 2000 units capsule Take 4,000 Units by mouth daily.   Coenzyme Q10 (COQ10) 100 MG CAPS Take 100 mg by mouth daily.   diclofenac  (CATAFLAM ) 50 MG tablet Take 1 tablet (50 mg total) by mouth daily after breakfast.   famotidine  (PEPCID ) 20 MG tablet Take 20 mg by mouth daily.   gabapentin  (NEURONTIN ) 100 MG capsule Take 1 capsule (100 mg total) by mouth at bedtime.    guaiFENesin  (MUCINEX ) 600 MG 12 hr tablet Take 2 tablets (1,200 mg total) by mouth 2 (two) times daily.   ipratropium-albuterol  (DUONEB) 0.5-2.5 (3) MG/3ML SOLN Take 3 mLs by nebulization every 6 (six) hours as needed.   levothyroxine  (SYNTHROID , LEVOTHROID) 100 MCG tablet Take 100 mcg by mouth daily before breakfast.    LORazepam  (ATIVAN ) 0.5 MG tablet Take 0.25-0.5 mg by mouth every 8 (eight) hours as needed for anxiety.   LUTEIN  PO Take 1 tablet by mouth daily.   Multiple Vitamin (MULTIVITAMIN) tablet Take 1 tablet by mouth daily.   nitroGLYCERIN  (NITROSTAT ) 0.4 MG SL tablet Place 1 tablet (0.4 mg total) under the tongue every 5 (five) minutes as needed for chest pain.   Omega-3 Fatty Acids (FISH OIL PO) Take 1 capsule by mouth daily.   pravastatin  (PRAVACHOL ) 40 MG tablet TAKE 1 TABLET BY MOUTH DAILY GENERIC EQUIVALENT FOR PRAVACHOL    solifenacin (VESICARE) 5 MG tablet Take 5 mg by mouth daily.   tamoxifen  (NOLVADEX ) 20 MG tablet TAKE 1 TABLET BY MOUTH ONCE DAILY. APPOINTMENT REQUIRED FOR FUTURE REFILLS   traMADol  (ULTRAM ) 50 MG tablet Take 0.5-1 tablets (25-50 mg total) by mouth every 8 (eight) hours as needed.   No current facility-administered medications for this visit. (Other)   REVIEW OF SYSTEMS:    ALLERGIES Allergies  Allergen Reactions   Lipitor [Atorvastatin ]     Elevated LFT's   PAST MEDICAL HISTORY Past Medical History:  Diagnosis Date   Anxiety    Arthritis  Breast cancer (HCC)    CAD (coronary artery disease)    a. 03/2017: 95% LCx stenosis --> PCI/DES placed   Cataract    Chest pain 03/27/2017   Chronic diastolic heart failure (HCC) 06/25/2017   Chronic lower back pain    Colon cancer (HCC) 11/2019   s/p colectomy   COPD (chronic obstructive pulmonary disease) (HCC)    Coronary artery disease    Diverticulitis    Diverticulosis    GERD (gastroesophageal reflux disease)    Glaucoma    Heart failure, type unknown (HCC) 03/11/2017   History of radiation  therapy 08/05/19- 09/03/19   Right Breast/ SCV 16 fractions of 2.66 Gy each for a total of 42.56 Gy. Right breast boost 4 fractions of 2 Gy each to total 8 Gy.    Hyperlipidemia 03/11/2017   Hypertension    Hypothyroidism    Macular degeneration    wet and dry per pt's son   Personal history of chemotherapy    Personal history of radiation therapy    Pneumonia X 1   years and years ago (03/26/2017)   Shortness of breath 03/11/2017   Status post dilation of esophageal narrowing    Past Surgical History:  Procedure Laterality Date   BILATERAL OOPHORECTOMY Bilateral    BREAST LUMPECTOMY Right 06/19/2019   BREAST LUMPECTOMY WITH RADIOACTIVE SEED AND SENTINEL LYMPH NODE BIOPSY Right 06/19/2019   Procedure: RIGHT BREAST LUMPECTOMY WITH RADIOACTIVE SEED AND SENTINEL LYMPH NODE BIOPSY;  Surgeon: Curvin Deward MOULD, MD;  Location: [facility] OR;  Service: General;  Laterality: Right;   CATARACT EXTRACTION     CATARACT EXTRACTION W/ INTRAOCULAR LENS  IMPLANT, BILATERAL Bilateral    COLOSTOMY     Greater than 10 yrs   CORONARY ANGIOPLASTY WITH STENT PLACEMENT  03/26/2017   LAPAROSCOPIC RIGHT COLECTOMY Right 12/15/2019   Procedure: LAPAROSCOPIC ASSISTED RIGHT COLECTOMY;  Surgeon: Curvin Deward MOULD, MD;  Location: [facility] OR;  Service: General;  Laterality: Right;   LEFT HEART CATH AND CORONARY ANGIOGRAPHY N/A 03/26/2017   Procedure: Left Heart Cath and Coronary Angiography;  Surgeon: Claudene Victory ORN, MD;  Location: [facility] INVASIVE CV LAB;  Service: Cardiovascular;  Laterality: N/A;   NERVE SURGERY     lump on my sciatic nerve was removed years ago   SHOULDER OPEN ROTATOR CUFF REPAIR Right    THYROIDECTOMY, PARTIAL     TONSILLECTOMY     UPPER GASTROINTESTINAL ENDOSCOPY     > ten yrs   FAMILY HISTORY Family History  Problem Relation Age of Onset   CAD Mother    Cataracts Mother    Coronary artery disease Father    Leukemia Sister    Amblyopia Neg Hx    Blindness Neg Hx    Diabetes Neg Hx    Glaucoma Neg Hx     Macular degeneration Neg Hx    Retinal detachment Neg Hx    Strabismus Neg Hx    Retinitis pigmentosa Neg Hx    Colon cancer Neg Hx    Colon polyps Neg Hx    Esophageal cancer Neg Hx    Rectal cancer Neg Hx    Stomach cancer Neg Hx    SOCIAL HISTORY Social History   Tobacco Use   Smoking status: Former    Current packs/day: 0.50    Average packs/day: 0.5 packs/day for 60.0 years (30.0 ttl pk-yrs)    Types: Cigarettes   Smokeless tobacco: Never  Vaping Use   Vaping status: Never Used  Substance Use  Topics   Alcohol use: Not Currently    Comment: twice a month   Drug use: No       OPHTHALMIC EXAM:  Not recorded    IMAGING AND PROCEDURES  Imaging and Procedures for @TODAY @          ASSESSMENT/PLAN:    ICD-10-CM   1. 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 Exudative age-related macular degeneration of left eye with active choroidal neovascularization (HCC)  H35.3221     3. Posterior vitreous detachment of both eyes  H43.813     4. Pigmentary glaucoma of both eyes, mild stage  H40.1331     5. Pseudophakia of both eyes  Z96.1     6. Dry eyes  H04.123        1. Exudative age related macular degeneration, OD -- inactive CNV / scar  - onset ~2 mos prior to presentation, waited for scheduled appt with Dr. Lelon  - S/P IVA OD #1 (05.20.19), #2 (07.02.19), #3 (07.30.19) - S/P IVE OD #1 (08.27.19), #2 (09.24.19), #3 (11.05.19), #4 (12.17.19), #5 (02.21.20), #6 (05.05.20), # 7 (08.05.20), #8 (9.2.20), #9 (11.10.20), #10 (02.12.21), #11 (05.12.21), #12 (08.13.21)  - VA remains CF - OCT with persistent thick /SRHM/disciform scar centrally  - discussed findings and guarded prognosis and treatment options - due to stable, inactive submacular scar and VA CF -- recommend holding off on injection today  - pt in agreement  2. Exudative age related macular degeneration, left eye  - interval conversion from  nonexudative ARMD noted on 01.10.25 visit  -- pt was lost to retina f/u from Jan 2024 to Jan 2025 - referred back here by Dr. Fleeta - s/p IVA OS #1 (01.10.25), #2 (02.10.25), #3 (03.24.25), #4 (05.19.25)  - BCVA OS: stable at CF 3'  - OCT shows Interval improvement PED/CNV and SRHM/edema overlying, Significant central GA / ORA at 8 weeks - recommend IVA OS #5 today 07.28.25 with f/u extended to 10 weeks - pt wishes to proceed with injection - RBA of procedure discussed, questions answered - Avastin  consent obtained, signed, and scanned 01.10.25 - see procedure note   - f/u 10 weeks, DFE, OCT, possible injection  3. PVD / vitreous syneresis OU  - Discussed findings and prognosis  - No RT or RD on 360 peripheral exam  - Reviewed s/s of RT/RD  - strict return precautions for any such RT/RD symptoms  4. Pigmentary glaucoma OU-   - using latanoprost  OU qhs / brimonidine  BID OU  - IOP good today -- 08,06  - monitor  5. Pseudophakia OU  - s/p CE/IOL OU  - beautiful surgery, doing well  - monitor  6. Dry eyes OU  - recommend artificial tears and lubricating ointment as needed  - monitor  Ophthalmic Meds Ordered this visit:  No orders of the defined types were placed in this encounter.    No follow-ups on file.  There are no Patient Instructions on file for this visit.  This document serves as a record of services personally performed by Redell JUDITHANN Hans, MD, PhD. It was created on their behalf by Avelina Pereyra, COA an ophthalmic technician. The creation of this record is the provider's dictation and/or activities during the visit.   Electronically signed by: Avelina GORMAN Pereyra, COT  03/03/24  11:39 AM       Redell JUDITHANN Hans, M.D., Ph.D. Diseases & Surgery of the Retina and Vitreous  [facility]   Abbreviations: M myopia (nearsighted); A astigmatism; H hyperopia (farsighted); P presbyopia; Mrx spectacle prescription;  CTL contact lenses; OD right eye; OS left  eye; OU both eyes  XT exotropia; ET esotropia; PEK punctate epithelial keratitis; PEE punctate epithelial erosions; DES dry eye syndrome; MGD meibomian gland dysfunction; ATs artificial tears; PFAT's preservative free artificial tears; NSC nuclear sclerotic cataract; PSC posterior subcapsular cataract; ERM epi-retinal membrane; PVD posterior vitreous detachment; RD retinal detachment; DM diabetes mellitus; DR diabetic retinopathy; NPDR non-proliferative diabetic retinopathy; PDR proliferative diabetic retinopathy; CSME clinically significant macular edema; DME diabetic macular edema; dbh dot blot hemorrhages; CWS cotton wool spot; POAG primary open angle glaucoma; C/D cup-to-disc ratio; HVF humphrey visual field; GVF goldmann visual field; OCT optical coherence tomography; IOP intraocular pressure; BRVO Branch retinal vein occlusion; CRVO central retinal vein occlusion; CRAO central retinal artery occlusion; BRAO branch retinal artery occlusion; RT retinal tear; SB scleral buckle; PPV pars plana vitrectomy; VH Vitreous hemorrhage; PRP panretinal laser photocoagulation; IVK intravitreal kenalog; VMT vitreomacular traction; MH Macular hole;  NVD neovascularization of the disc; NVE neovascularization elsewhere; AREDS age related eye disease study; ARMD age related macular degeneration; POAG primary open angle glaucoma; EBMD epithelial/anterior basement membrane dystrophy; ACIOL anterior chamber intraocular lens; IOL intraocular lens; PCIOL posterior chamber intraocular lens; Phaco/IOL phacoemulsification with intraocular lens placement; PRK photorefractive keratectomy; LASIK laser assisted in situ keratomileusis; HTN hypertension; DM diabetes mellitus; COPD chronic obstructive pulmonary disease

## 2024-03-06 DIAGNOSIS — J449 Chronic obstructive pulmonary disease, unspecified: Secondary | ICD-10-CM | POA: Diagnosis not present

## 2024-03-07 ENCOUNTER — Ambulatory Visit: Admitting: Podiatry

## 2024-03-07 ENCOUNTER — Encounter: Payer: Self-pay | Admitting: Podiatry

## 2024-03-07 ENCOUNTER — Ambulatory Visit (INDEPENDENT_AMBULATORY_CARE_PROVIDER_SITE_OTHER)

## 2024-03-07 DIAGNOSIS — M2041 Other hammer toe(s) (acquired), right foot: Secondary | ICD-10-CM | POA: Diagnosis not present

## 2024-03-07 DIAGNOSIS — M79671 Pain in right foot: Secondary | ICD-10-CM

## 2024-03-07 NOTE — Progress Notes (Signed)
 Patient presents with complaint of pain at the second toe over the dorsal medial aspect and medial aspect of the DIPJ.  Some tenderness along the lateral aspect of the IPJ of the hallux right.  Been having problem with it for several years she been trying a silicone spacer in the toe but does not seem to work as well anymore.    Physical exam:  General appearance: Pleasant, and in no acute distress. AOx3.  Vascular: Pedal pulses: DP 2/4 bilaterally, PT 1/4 bilaterally.  Mild edema lower legs bilaterally. Capillary fill time immediate bilaterally.  Neurological: Light touch intact feet bilaterally.  Normal Achilles reflex bilaterally.  No clonus or spasticity noted.   Dermatologic:   Skin normal temperature bilaterally.  Skin thin and atrophic with no hair growth lower extremity.  No open lesions noted.  Hyperkeratotic lesion on the dorsal medial aspect of the DIPJ of the second toe.  Lesion is tender  Musculoskeletal: Hammertoe second toe right.  HAV deformity right.  Tenderness at distal inner phalangeal joint of second toe right and lateral aspect of IPJ hallux  Radiographs: 3 views right foot: Hammertoe second toe.  With no evidence of any fractures or bone tumors.  Normal bone density.  HAV deformity.  Appears that he may have had an arthroplasty at the distal inner phalangeal joint of the second toe in the past  Diagnosis: 1.  Pain foot right 2.  Hammertoe second toe right  Plan: -New patient visit office level 3 for evaluation and management. - Discussed the hammertoe deformity and the pain that is developing at the second toe and the hallux from this.  Discussed proper shoes and socks.  Will also have her try foam sleeves for the toes to see if this helps more than the silicone.  Explained that this can probably be managed conservatively. -Dispensed foam sleeves for second toe and hallux.  Recommended using 1 or the other but see which one works better   Return prn

## 2024-03-11 DIAGNOSIS — N390 Urinary tract infection, site not specified: Secondary | ICD-10-CM | POA: Diagnosis not present

## 2024-03-11 DIAGNOSIS — R399 Unspecified symptoms and signs involving the genitourinary system: Secondary | ICD-10-CM | POA: Diagnosis not present

## 2024-03-12 ENCOUNTER — Telehealth: Payer: Self-pay

## 2024-03-12 ENCOUNTER — Telehealth: Payer: Self-pay | Admitting: Pulmonary Disease

## 2024-03-12 NOTE — Telephone Encounter (Addendum)
 Lucien Leila MATSU   03/12/2024 12:08 PM  Type: Phone Message  Patient's son Prentice [phone] is following up on overnight oxygen  test for patient and has not heard from the office. Informed Fred, the office has tried many times to call back. Prentice states has not heard from the office and wants to speak with someone now. Fred hung up while contacting CAL. Per CAL, will send a message to Dr. Theophilus nurse and she will call back. I called back Prentice and informed what CAL said. Fred verbalized understanding and will wait for the office call back.     PT son, Prentice COLT, calling again for results. Please contact him to advise about results. States no one has called. E2C2 read notes that we had tried a few times.    Called patient's son, Prentice.  Patient has not had an ONO done yet, needs to schedule.  Order for ONO on room air per Dr. Theophilus.  Gave [employer]'s phone number listed in other telephone encounter from 01/31/2024:   Patient or pt's family can call in at [phone] to get this scheduled.    He will call [employer] and get ONO on room air scheduled for patient.  Patient's son verbalized understanding.

## 2024-03-12 NOTE — Telephone Encounter (Signed)
 PT son, Prentice COLT, calling again for results. Please contact him to advise about results. States no one has called. E2C2 read notes that we had tried a few times.

## 2024-03-12 NOTE — Telephone Encounter (Signed)
 Called patient's son, Prentice.  Gave [employer]'s phone number listed in other telephone encounter from 01/31/2024:  Patient or pt's family can call in at [phone] to get this scheduled.   He will call [employer] and get ONO on room air scheduled for patient.  Patient's son verbalized understanding.

## 2024-03-12 NOTE — Telephone Encounter (Signed)
 Copied from CRM [id number]. Topic: Referral - Status >> Feb 28, 2024 11:17 AM Nathanel DEL wrote: Reason for CRM: son Prentice calling to follow uo n oximetry test ordered by Dr Theophilus.  They have not heard anything,  please advise Fred phone number [phone] >> Mar 12, 2024 12:08 PM Leila BROCKS wrote: Patient's son Prentice [phone] is following up on overnight oxygen  test for patient and has not heard from the office. Informed Fred, the office has tried many times to call back. Prentice states has not heard from the office and wants to speak with someone now. Fred hung up while contacting CAL. Per CAL, will send a message to Dr. Theophilus nurse and she will call back. I called back Prentice and informed what CAL said. Fred verbalized understanding and will wait for the office call back.      Called patients son, Prentice.  Prentice needs number to call to get patient scheduled for ONO with [employer] per chart order.  See telephone encounter from 01/31/2024.  PCC's tried to call patient several times.  Gave patient's son the number to call to schedule ONO on room air.  Patient's son verbalized understanding and will call [employer] to schedule test.

## 2024-03-17 ENCOUNTER — Encounter (INDEPENDENT_AMBULATORY_CARE_PROVIDER_SITE_OTHER): Admitting: Ophthalmology

## 2024-03-17 DIAGNOSIS — H353213 Exudative age-related macular degeneration, right eye, with inactive scar: Secondary | ICD-10-CM

## 2024-03-17 DIAGNOSIS — H43813 Vitreous degeneration, bilateral: Secondary | ICD-10-CM

## 2024-03-17 DIAGNOSIS — H04123 Dry eye syndrome of bilateral lacrimal glands: Secondary | ICD-10-CM

## 2024-03-17 DIAGNOSIS — Z961 Presence of intraocular lens: Secondary | ICD-10-CM

## 2024-03-17 DIAGNOSIS — H353221 Exudative age-related macular degeneration, left eye, with active choroidal neovascularization: Secondary | ICD-10-CM

## 2024-03-17 DIAGNOSIS — H401331 Pigmentary glaucoma, bilateral, mild stage: Secondary | ICD-10-CM

## 2024-03-18 NOTE — Progress Notes (Signed)
 [facility] - Clinic Note  03/31/2024     CHIEF COMPLAINT Patient presents for Retina Follow Up    HISTORY OF PRESENT ILLNESS: Marissa Caldwell is a 89 y.o. female who presents to the clinic today for:   HPI     Retina Follow Up   Patient presents with  Wet AMD.  In right eye.  Severity is severe.  Duration of 10 weeks.  Since onset it is gradually improving.  I, the attending physician,  performed the HPI with the patient and updated documentation appropriately.        Comments   10 week Retina eval. Patient states vision may be little better       Last edited by Valdemar Rogue, MD on 03/31/2024  8:28 PM.    Pt states vision is stable.  Referring physician:  Teresa Channel, MD [phone] [address] [place],  [place] [address]  HISTORICAL INFORMATION:   Selected notes from the MEDICAL RECORD NUMBER Referred by Dr. Medford Ferrier for concern of exudative ARMD OD   CURRENT MEDICATIONS: Current Outpatient Medications (Ophthalmic Drugs)  Medication Sig   brimonidine -timolol  (COMBIGAN) 0.2-0.5 % ophthalmic solution Apply to eye.   latanoprost  (XALATAN ) 0.005 % ophthalmic solution Place 1 drop into both eyes at bedtime.   No current facility-administered medications for this visit. (Ophthalmic Drugs)   Current Outpatient Medications (Other)  Medication Sig   albuterol  (PROVENTIL  HFA;VENTOLIN  HFA) 108 (90 Base) MCG/ACT inhaler Inhale 2 puffs into the lungs every 6 (six) hours as needed for wheezing or shortness of breath.   amLODipine  (NORVASC ) 5 MG tablet Take 1 tablet (5 mg total) by mouth daily.   arformoterol  (BROVANA ) 15 MCG/2ML NEBU Take 2 mLs (15 mcg total) by nebulization 2 (two) times daily.   budesonide  (PULMICORT ) 0.25 MG/2ML nebulizer solution Take 2 mLs (0.25 mg total) by nebulization 2 (two) times daily.   Calcium  Carb-Cholecalciferol  (CALCIUM  + D3) 600-200 MG-UNIT TABS Take 1 tablet by mouth daily.   Cholecalciferol  (VITAMIN D3) 2000  units capsule Take 4,000 Units by mouth daily.   Coenzyme Q10 (COQ10) 100 MG CAPS Take 100 mg by mouth daily.   diclofenac  (CATAFLAM ) 50 MG tablet Take 1 tablet (50 mg total) by mouth daily after breakfast.   famotidine  (PEPCID ) 20 MG tablet Take 20 mg by mouth daily.   gabapentin  (NEURONTIN ) 100 MG capsule Take 1 capsule (100 mg total) by mouth at bedtime.   guaiFENesin  (MUCINEX ) 600 MG 12 hr tablet Take 2 tablets (1,200 mg total) by mouth 2 (two) times daily.   ipratropium-albuterol  (DUONEB) 0.5-2.5 (3) MG/3ML SOLN Take 3 mLs by nebulization every 6 (six) hours as needed.   levothyroxine  (SYNTHROID , LEVOTHROID) 100 MCG tablet Take 100 mcg by mouth daily before breakfast.    LORazepam  (ATIVAN ) 0.5 MG tablet Take 0.25-0.5 mg by mouth every 8 (eight) hours as needed for anxiety.   LUTEIN  PO Take 1 tablet by mouth daily.   Multiple Vitamin (MULTIVITAMIN) tablet Take 1 tablet by mouth daily.   nitroGLYCERIN  (NITROSTAT ) 0.4 MG SL tablet Place 1 tablet (0.4 mg total) under the tongue every 5 (five) minutes as needed for chest pain.   Omega-3 Fatty Acids (FISH OIL PO) Take 1 capsule by mouth daily.   pravastatin  (PRAVACHOL ) 40 MG tablet TAKE 1 TABLET BY MOUTH DAILY GENERIC EQUIVALENT FOR PRAVACHOL    solifenacin (VESICARE) 5 MG tablet Take 5 mg by mouth daily.   tamoxifen  (NOLVADEX ) 20 MG tablet TAKE 1 TABLET  BY MOUTH ONCE DAILY. APPOINTMENT REQUIRED FOR FUTURE REFILLS   traMADol  (ULTRAM ) 50 MG tablet Take 0.5-1 tablets (25-50 mg total) by mouth every 8 (eight) hours as needed.   No current facility-administered medications for this visit. (Other)   REVIEW OF SYSTEMS: ROS   Positive for: HENT, Endocrine, Cardiovascular, Eyes, Respiratory, Heme/Lymph Negative for: Constitutional, Gastrointestinal, Neurological, Skin, Genitourinary, Musculoskeletal, Psychiatric, Allergic/Imm Last edited by German Olam BRAVO, COT on 03/31/2024  1:17 PM.       ALLERGIES Allergies  Allergen Reactions   Lipitor  [Atorvastatin ]     Elevated LFT's   PAST MEDICAL HISTORY Past Medical History:  Diagnosis Date   Anxiety    Arthritis    Breast cancer (HCC)    CAD (coronary artery disease)    a. 03/2017: 95% LCx stenosis --> PCI/DES placed   Cataract    Chest pain 03/27/2017   Chronic diastolic heart failure (HCC) 06/25/2017   Chronic lower back pain    Colon cancer (HCC) 11/2019   s/p colectomy   COPD (chronic obstructive pulmonary disease) (HCC)    Coronary artery disease    Diverticulitis    Diverticulosis    GERD (gastroesophageal reflux disease)    Glaucoma    Heart failure, type unknown (HCC) 03/11/2017   History of radiation therapy 08/05/19- 09/03/19   Right Breast/ SCV 16 fractions of 2.66 Gy each for a total of 42.56 Gy. Right breast boost 4 fractions of 2 Gy each to total 8 Gy.    Hyperlipidemia 03/11/2017   Hypertension    Hypothyroidism    Macular degeneration    wet and dry per pt's son   Personal history of chemotherapy    Personal history of radiation therapy    Pneumonia X 1   years and years ago (03/26/2017)   Shortness of breath 03/11/2017   Status post dilation of esophageal narrowing    Past Surgical History:  Procedure Laterality Date   BILATERAL OOPHORECTOMY Bilateral    BREAST LUMPECTOMY Right 06/19/2019   BREAST LUMPECTOMY WITH RADIOACTIVE SEED AND SENTINEL LYMPH NODE BIOPSY Right 06/19/2019   Procedure: RIGHT BREAST LUMPECTOMY WITH RADIOACTIVE SEED AND SENTINEL LYMPH NODE BIOPSY;  Surgeon: Curvin Deward MOULD, MD;  Location: [facility] OR;  Service: General;  Laterality: Right;   CATARACT EXTRACTION     CATARACT EXTRACTION W/ INTRAOCULAR LENS  IMPLANT, BILATERAL Bilateral    COLOSTOMY     Greater than 10 yrs   CORONARY ANGIOPLASTY WITH STENT PLACEMENT  03/26/2017   LAPAROSCOPIC RIGHT COLECTOMY Right 12/15/2019   Procedure: LAPAROSCOPIC ASSISTED RIGHT COLECTOMY;  Surgeon: Curvin Deward MOULD, MD;  Location: [facility] OR;  Service: General;  Laterality: Right;   LEFT HEART CATH AND  CORONARY ANGIOGRAPHY N/A 03/26/2017   Procedure: Left Heart Cath and Coronary Angiography;  Surgeon: Claudene Victory ORN, MD;  Location: [facility] INVASIVE CV LAB;  Service: Cardiovascular;  Laterality: N/A;   NERVE SURGERY     lump on my sciatic nerve was removed years ago   SHOULDER OPEN ROTATOR CUFF REPAIR Right    THYROIDECTOMY, PARTIAL     TONSILLECTOMY     UPPER GASTROINTESTINAL ENDOSCOPY     > ten yrs   FAMILY HISTORY Family History  Problem Relation Age of Onset   CAD Mother    Cataracts Mother    Coronary artery disease Father    Leukemia Sister    Amblyopia Neg Hx    Blindness Neg Hx    Diabetes Neg Hx    Glaucoma Neg  Hx    Macular degeneration Neg Hx    Retinal detachment Neg Hx    Strabismus Neg Hx    Retinitis pigmentosa Neg Hx    Colon cancer Neg Hx    Colon polyps Neg Hx    Esophageal cancer Neg Hx    Rectal cancer Neg Hx    Stomach cancer Neg Hx    SOCIAL HISTORY Social History   Tobacco Use   Smoking status: Former    Current packs/day: 0.50    Average packs/day: 0.5 packs/day for 60.0 years (30.0 ttl pk-yrs)    Types: Cigarettes   Smokeless tobacco: Never  Vaping Use   Vaping status: Never Used  Substance Use Topics   Alcohol use: Not Currently    Comment: twice a month   Drug use: No       OPHTHALMIC EXAM:  Base Eye Exam     Visual Acuity (Snellen - Linear)       Right Left   Dist [place] 20/CF 20/CF         Tonometry (Tonopen, 1:24 PM)       Right Left   Pressure 8 10         Pupils       Dark Light Shape React APD   Right 3 2 Round Brisk None   Left 3 2 Round Brisk None         Visual Fields (Counting fingers)       Left Right    Full Full         Extraocular Movement       Right Left    Full, Ortho Full, Ortho         Neuro/Psych     Oriented x3: Yes   Mood/Affect: Normal         Dilation     Both eyes: 1.0% Mydriacyl, 2.5% Phenylephrine  @ 1:24 PM           Slit Lamp and Fundus Exam     Slit Lamp  Exam       Right Left   Lids/Lashes Dermatochalasis - upper lid, Telangiectasia, Meibomian gland dysfunction - improving Dermatochalasis - upper lid, Telangiectasia, Meibomian gland dysfunction   Conjunctiva/Sclera White and quiet trace Injection   Cornea Arcus, 2-3+ Punctate epithelial erosions, 3+ fine endo pigment, tear film debris, mild central haze Arcus, 3+ Punctate epithelial erosions, endo pigment / Krunkenberg's spindle, mild central haze   Anterior Chamber Deep and quiet Deep and quiet   Iris Round and dilated Round and dilated   Lens Three piece PC IOL in good position, open PC PC IOL in good position, clear PC   Anterior Vitreous Vitreous syneresis, Posterior vitreous detachment, mild vitreous condensations Mild Vitreous syneresis, Posterior vitreous detachment         Fundus Exam       Right Left   Disc sharp rim, 360 Peripapillary atrophy, 2-3+Pallor 360 Peripapillary atrophy, 2+pallor, sharp rim   C/D Ratio 0.2 0.1   Macula Blunted foveal reflex, Drusen, RPE mottling, clumping and atrophy, +PED, +CNVM, sub-retinal central Disciform scar with subretinal fibrosis, no heme, central pigment clumping Blunted foveal reflex, Drusen, RPE mottling, clumping, and atrophy, focal GA nasal macula extending into fovea, CNV with +edema temporal to fovea -- improving, early fibrosis, no frank heme   Vessels attenuated, mild tortuosity attenuated, mild tortuosity   Periphery Attached, reticular degeneration, No heme Attached, reticular degeneration, focal IRH above disc  IMAGING AND PROCEDURES  Imaging and Procedures for @TODAY @  OCT, Retina - OU - Both Eyes       Right Eye Quality was good. Central Foveal Thickness: 267. Progression has been stable. Findings include no IRF, no SRF, abnormal foveal contour, retinal drusen , subretinal hyper-reflective material, disciform scar, epiretinal membrane, pigment epithelial detachment, outer retinal atrophy (persistent thick  SRHM/disciform scar -- stable from prior).   Left Eye Quality was good. Central Foveal Thickness: 278. Progression has improved. Findings include no IRF, no SRF, abnormal foveal contour, retinal drusen , subretinal hyper-reflective material, pigment epithelial detachment, outer retinal atrophy (stable improvement in PED/CNV and SRHM edema temporal significant central GA / ORA ).   Notes *Images captured and stored on drive  Diagnosis / Impression:  OD: Exudative AMD with significant submacular scar/SRHM --  persistent thick SRHM/disciform scar -- no IRF/SRF OS: stable improvement in PED/CNV, SRHM and edema temporal to significant central GA / ORA   Clinical management:  See below  Abbreviations: NFP - Normal foveal profile. CME - cystoid macular edema. PED - pigment epithelial detachment. IRF - intraretinal fluid. SRF - subretinal fluid. EZ - ellipsoid zone. ERM - epiretinal membrane. ORA - outer retinal atrophy. ORT - outer retinal tubulation. SRHM - subretinal hyper-reflective material       Intravitreal Injection, Pharmacologic Agent - OS - Left Eye       Time Out 03/31/2024. 3:25 PM. Confirmed correct patient, procedure, site, and patient consented.   Anesthesia Topical anesthesia was used. Anesthetic medications included Lidocaine  2%, Proparacaine 0.5%.   Procedure Preparation included 5% betadine  to ocular surface, eyelid speculum. A supplied needle was used.   Injection: 1.25 mg Bevacizumab  1.25mg /0.37ml   Route: Intravitreal, Site: Left Eye   NDC: [id number], Lot: [id number], Expiration date: 04/14/2024   Post-op Post injection exam found visual acuity of at least counting fingers. The patient tolerated the procedure well. There were no complications. The patient received written and verbal post procedure care education. Post injection medications were not given.            ASSESSMENT/PLAN:    ICD-10-CM   1. 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 Exudative age-related macular degeneration of left eye with active choroidal neovascularization (HCC)  H35.3221 OCT, Retina - OU - Both Eyes    Intravitreal Injection, Pharmacologic Agent - OS - Left Eye    Bevacizumab  (AVASTIN ) SOLN 1.25 mg    3. Posterior vitreous detachment of both eyes  H43.813     4. Pigmentary glaucoma of both eyes, mild stage  H40.1331     5. Pseudophakia of both eyes  Z96.1     6. Dry eyes  H04.123      1. Exudative age related macular degeneration, OD -- inactive CNV / scar  - onset ~2 mos prior to presentation, waited for scheduled appt with Dr. Lelon  - S/P IVA OD #1 (05.20.19), #2 (07.02.19), #3 (07.30.19) - S/P IVE OD #1 (08.27.19), #2 (09.24.19), #3 (11.05.19), #4 (12.17.19), #5 (02.21.20), #6 (05.05.20), # 7 (08.05.20), #8 (9.2.20), #9 (11.10.20), #10 (02.12.21), #11 (05.12.21), #12 (08.13.21)  - VA remains CF - OCT with persistent thick /SRHM/disciform scar centrally  - discussed findings and guarded prognosis and treatment options - due to stable, inactive submacular scar and VA CF -- recommend holding off on injection today  - pt in agreement  2. Exudative age related  macular degeneration, left eye  - interval conversion from nonexudative ARMD noted on 01.10.25 visit  -- pt was lost to retina f/u from Jan 2024 to Jan 2025 - referred back here by Dr. Fleeta - s/p IVA OS #1 (01.10.25), #2 (02.10.25), #3 (03.24.25), #4 (05.19.25)  - BCVA OS: stable at CF 3'  - OCT shows stable improvement in PED/CNV, SRHM and edema temporal to significant central GA / ORA at 12 weeks - recommend IVA OS #5 today 07.28.25 with f/u extended to 12 weeks - pt wishes to proceed with injection - RBA of procedure discussed, questions answered - Avastin  consent obtained, signed, and scanned 01.10.25 - see procedure note   - f/u 12 weeks, DFE, OCT, possible injection  3. PVD / vitreous syneresis OU  -  Discussed findings and prognosis  - No RT or RD on 360 peripheral exam  - Reviewed s/s of RT/RD  - strict return precautions for any such RT/RD symptoms  4. Pigmentary glaucoma OU  - using latanoprost  OU qhs / brimonidine  BID OU  - IOP good today -- 08,10  - monitor  5. Pseudophakia OU  - s/p CE/IOL OU  - beautiful surgery, doing well  - monitor  6. Dry eyes OU  - recommend artificial tears and lubricating ointment as needed  - monitor  Ophthalmic Meds Ordered this visit:  Meds ordered this encounter  Medications   Bevacizumab  (AVASTIN ) SOLN 1.25 mg     Return in about 12 weeks (around 06/23/2024) for ex ARMD OU - DFE, OCT, Possible Injxn.  There are no Patient Instructions on file for this visit.  This document serves as a record of services personally performed by Redell JUDITHANN Hans, MD, PhD. It was created on their behalf by Avelina Pereyra, COA an ophthalmic technician. The creation of this record is the provider's dictation and/or activities during the visit.   Electronically signed by: Avelina GORMAN Pereyra, COT  03/31/24  8:36 PM    This document serves as a record of services personally performed by Redell JUDITHANN Hans, MD, PhD. It was created on their behalf by Auston Muzzy, COMT. The creation of this record is the provider's dictation and/or activities during the visit.  Electronically signed by: Auston Muzzy, COMT 03/31/24 8:36 PM  Redell JUDITHANN Hans, M.D., Ph.D. Diseases & Surgery of the Retina and Vitreous [facility]  I have reviewed the above documentation for accuracy and completeness, and I agree with the above. Redell JUDITHANN Hans, M.D., Ph.D. 03/31/24 8:37 PM   Abbreviations: M myopia (nearsighted); A astigmatism; H hyperopia (farsighted); P presbyopia; Mrx spectacle prescription;  CTL contact lenses; OD right eye; OS left eye; OU both eyes  XT exotropia; ET esotropia; PEK punctate epithelial keratitis; PEE punctate epithelial erosions; DES dry eye  syndrome; MGD meibomian gland dysfunction; ATs artificial tears; PFAT's preservative free artificial tears; NSC nuclear sclerotic cataract; PSC posterior subcapsular cataract; ERM epi-retinal membrane; PVD posterior vitreous detachment; RD retinal detachment; DM diabetes mellitus; DR diabetic retinopathy; NPDR non-proliferative diabetic retinopathy; PDR proliferative diabetic retinopathy; CSME clinically significant macular edema; DME diabetic macular edema; dbh dot blot hemorrhages; CWS cotton wool spot; POAG primary open angle glaucoma; C/D cup-to-disc ratio; HVF humphrey visual field; GVF goldmann visual field; OCT optical coherence tomography; IOP intraocular pressure; BRVO Branch retinal vein occlusion; CRVO central retinal vein occlusion; CRAO central retinal artery occlusion; BRAO branch retinal artery occlusion; RT retinal tear; SB scleral buckle; PPV pars plana vitrectomy; VH Vitreous hemorrhage; PRP panretinal  laser photocoagulation; IVK intravitreal kenalog; VMT vitreomacular traction; MH Macular hole;  NVD neovascularization of the disc; NVE neovascularization elsewhere; AREDS age related eye disease study; ARMD age related macular degeneration; POAG primary open angle glaucoma; EBMD epithelial/anterior basement membrane dystrophy; ACIOL anterior chamber intraocular lens; IOL intraocular lens; PCIOL posterior chamber intraocular lens; Phaco/IOL phacoemulsification with intraocular lens placement; PRK photorefractive keratectomy; LASIK laser assisted in situ keratomileusis; HTN hypertension; DM diabetes mellitus; COPD chronic obstructive pulmonary disease

## 2024-03-27 ENCOUNTER — Other Ambulatory Visit: Payer: Self-pay | Admitting: Family Medicine

## 2024-03-27 DIAGNOSIS — Z1231 Encounter for screening mammogram for malignant neoplasm of breast: Secondary | ICD-10-CM

## 2024-03-31 ENCOUNTER — Ambulatory Visit (INDEPENDENT_AMBULATORY_CARE_PROVIDER_SITE_OTHER): Admitting: Ophthalmology

## 2024-03-31 ENCOUNTER — Encounter (INDEPENDENT_AMBULATORY_CARE_PROVIDER_SITE_OTHER): Payer: Self-pay | Admitting: Ophthalmology

## 2024-03-31 DIAGNOSIS — H43813 Vitreous degeneration, bilateral: Secondary | ICD-10-CM | POA: Diagnosis not present

## 2024-03-31 DIAGNOSIS — H353221 Exudative age-related macular degeneration, left eye, with active choroidal neovascularization: Secondary | ICD-10-CM | POA: Diagnosis not present

## 2024-03-31 DIAGNOSIS — R399 Unspecified symptoms and signs involving the genitourinary system: Secondary | ICD-10-CM | POA: Diagnosis not present

## 2024-03-31 DIAGNOSIS — H353213 Exudative age-related macular degeneration, right eye, with inactive scar: Secondary | ICD-10-CM | POA: Diagnosis not present

## 2024-03-31 DIAGNOSIS — N3281 Overactive bladder: Secondary | ICD-10-CM | POA: Diagnosis not present

## 2024-03-31 DIAGNOSIS — Z961 Presence of intraocular lens: Secondary | ICD-10-CM

## 2024-03-31 DIAGNOSIS — M48061 Spinal stenosis, lumbar region without neurogenic claudication: Secondary | ICD-10-CM | POA: Diagnosis not present

## 2024-03-31 DIAGNOSIS — B379 Candidiasis, unspecified: Secondary | ICD-10-CM | POA: Diagnosis not present

## 2024-03-31 DIAGNOSIS — H401331 Pigmentary glaucoma, bilateral, mild stage: Secondary | ICD-10-CM

## 2024-03-31 DIAGNOSIS — H04123 Dry eye syndrome of bilateral lacrimal glands: Secondary | ICD-10-CM

## 2024-03-31 MED ORDER — BEVACIZUMAB CHEMO INJECTION 1.25MG/0.05ML SYRINGE FOR KALEIDOSCOPE
1.2500 mg | INTRAVITREAL | Status: AC | PRN
  Administered 2024-03-31 (×2): 1.25 mg via INTRAVITREAL

## 2024-04-02 NOTE — Telephone Encounter (Signed)
 Called and spoke with patient son to have them call adapt to schedule ONO

## 2024-04-06 DIAGNOSIS — J449 Chronic obstructive pulmonary disease, unspecified: Secondary | ICD-10-CM | POA: Diagnosis not present

## 2024-04-22 ENCOUNTER — Ambulatory Visit
Admission: RE | Admit: 2024-04-22 | Discharge: 2024-04-22 | Disposition: A | Discharge status: Home / Self Care | Source: Ambulatory Visit | Attending: Family Medicine | Admitting: Family Medicine

## 2024-04-22 DIAGNOSIS — Z1231 Encounter for screening mammogram for malignant neoplasm of breast: Secondary | ICD-10-CM | POA: Diagnosis not present

## 2024-04-24 DIAGNOSIS — M5136 Other intervertebral disc degeneration, lumbar region with discogenic back pain only: Secondary | ICD-10-CM | POA: Diagnosis not present

## 2024-04-24 DIAGNOSIS — Z23 Encounter for immunization: Secondary | ICD-10-CM | POA: Diagnosis not present

## 2024-04-24 DIAGNOSIS — E441 Mild protein-calorie malnutrition: Secondary | ICD-10-CM | POA: Diagnosis not present

## 2024-04-24 DIAGNOSIS — G894 Chronic pain syndrome: Secondary | ICD-10-CM | POA: Diagnosis not present

## 2024-04-24 DIAGNOSIS — M48061 Spinal stenosis, lumbar region without neurogenic claudication: Secondary | ICD-10-CM | POA: Diagnosis not present

## 2024-05-13 DIAGNOSIS — M461 Sacroiliitis, not elsewhere classified: Secondary | ICD-10-CM | POA: Diagnosis not present

## 2024-05-15 DIAGNOSIS — Z23 Encounter for immunization: Secondary | ICD-10-CM | POA: Diagnosis not present

## 2024-05-15 DIAGNOSIS — E441 Mild protein-calorie malnutrition: Secondary | ICD-10-CM | POA: Diagnosis not present

## 2024-05-15 DIAGNOSIS — E039 Hypothyroidism, unspecified: Secondary | ICD-10-CM | POA: Diagnosis not present

## 2024-05-15 DIAGNOSIS — D649 Anemia, unspecified: Secondary | ICD-10-CM | POA: Diagnosis not present

## 2024-06-06 DIAGNOSIS — J449 Chronic obstructive pulmonary disease, unspecified: Secondary | ICD-10-CM | POA: Diagnosis not present

## 2024-06-09 NOTE — Progress Notes (Addendum)
 [facility] - Clinic Note  06/23/2024     CHIEF COMPLAINT Patient presents for Retina Follow Up    HISTORY OF PRESENT ILLNESS: Marissa Caldwell is a 89 y.o. female who presents to the clinic today for:   HPI     Retina Follow Up   Patient presents with  Wet AMD.  In both eyes.  This started 12 weeks ago.  Duration of 12 weeks.  Since onset it is stable.  I, the attending physician,  performed the HPI with the patient and updated documentation appropriately.        Comments   12 week retina follow up ARMD and IVA OS pt denies any flashes or floaters she reports no vision changes noticed        Last edited by Valdemar Rogue, MD on 06/23/2024 10:17 PM.     Pt states vision is stable.  Referring physician:  Teresa Channel, MD [phone] [address] [place],  [place] [address]  HISTORICAL INFORMATION:   Selected notes from the MEDICAL RECORD NUMBER Referred by Dr. Medford Ferrier for concern of exudative ARMD OD   CURRENT MEDICATIONS: Current Outpatient Medications (Ophthalmic Drugs)  Medication Sig   brimonidine -timolol  (COMBIGAN) 0.2-0.5 % ophthalmic solution Apply to eye.   latanoprost  (XALATAN ) 0.005 % ophthalmic solution Place 1 drop into both eyes at bedtime.   No current facility-administered medications for this visit. (Ophthalmic Drugs)   Current Outpatient Medications (Other)  Medication Sig   albuterol  (PROVENTIL  HFA;VENTOLIN  HFA) 108 (90 Base) MCG/ACT inhaler Inhale 2 puffs into the lungs every 6 (six) hours as needed for wheezing or shortness of breath.   amLODipine  (NORVASC ) 5 MG tablet Take 1 tablet (5 mg total) by mouth daily.   arformoterol  (BROVANA ) 15 MCG/2ML NEBU Take 2 mLs (15 mcg total) by nebulization 2 (two) times daily.   budesonide  (PULMICORT ) 0.25 MG/2ML nebulizer solution Take 2 mLs (0.25 mg total) by nebulization 2 (two) times daily.   Calcium  Carb-Cholecalciferol  (CALCIUM  + D3) 600-200 MG-UNIT TABS Take 1 tablet by mouth  daily.   Cholecalciferol  (VITAMIN D3) 2000 units capsule Take 4,000 Units by mouth daily.   Coenzyme Q10 (COQ10) 100 MG CAPS Take 100 mg by mouth daily.   diclofenac  (CATAFLAM ) 50 MG tablet Take 1 tablet (50 mg total) by mouth daily after breakfast.   famotidine  (PEPCID ) 20 MG tablet Take 20 mg by mouth daily.   gabapentin  (NEURONTIN ) 100 MG capsule Take 1 capsule (100 mg total) by mouth at bedtime.   guaiFENesin  (MUCINEX ) 600 MG 12 hr tablet Take 2 tablets (1,200 mg total) by mouth 2 (two) times daily.   ipratropium-albuterol  (DUONEB) 0.5-2.5 (3) MG/3ML SOLN Take 3 mLs by nebulization every 6 (six) hours as needed.   levothyroxine  (SYNTHROID , LEVOTHROID) 100 MCG tablet Take 100 mcg by mouth daily before breakfast.    LORazepam  (ATIVAN ) 0.5 MG tablet Take 0.25-0.5 mg by mouth every 8 (eight) hours as needed for anxiety.   LUTEIN  PO Take 1 tablet by mouth daily.   Multiple Vitamin (MULTIVITAMIN) tablet Take 1 tablet by mouth daily.   nitroGLYCERIN  (NITROSTAT ) 0.4 MG SL tablet Place 1 tablet (0.4 mg total) under the tongue every 5 (five) minutes as needed for chest pain.   Omega-3 Fatty Acids (FISH OIL PO) Take 1 capsule by mouth daily.   pravastatin  (PRAVACHOL ) 40 MG tablet TAKE 1 TABLET BY MOUTH DAILY GENERIC EQUIVALENT FOR PRAVACHOL    solifenacin (VESICARE) 5 MG tablet Take 5 mg by  mouth daily.   tamoxifen  (NOLVADEX ) 20 MG tablet TAKE 1 TABLET BY MOUTH ONCE DAILY. APPOINTMENT REQUIRED FOR FUTURE REFILLS   traMADol  (ULTRAM ) 50 MG tablet Take 0.5-1 tablets (25-50 mg total) by mouth every 8 (eight) hours as needed.   No current facility-administered medications for this visit. (Other)   REVIEW OF SYSTEMS: ROS   Positive for: HENT, Endocrine, Cardiovascular, Eyes, Respiratory, Heme/Lymph Negative for: Constitutional, Gastrointestinal, Neurological, Skin, Genitourinary, Musculoskeletal, Psychiatric, Allergic/Imm Last edited by Resa Delon ORN, COT on 06/23/2024  1:12 PM.         ALLERGIES Allergies  Allergen Reactions   Lipitor [Atorvastatin ]     Elevated LFT's   PAST MEDICAL HISTORY Past Medical History:  Diagnosis Date   Anxiety    Arthritis    Breast cancer (HCC)    CAD (coronary artery disease)    a. 03/2017: 95% LCx stenosis --> PCI/DES placed   Cataract    Chest pain 03/27/2017   Chronic diastolic heart failure (HCC) 06/25/2017   Chronic lower back pain    Colon cancer (HCC) 11/2019   s/p colectomy   COPD (chronic obstructive pulmonary disease) (HCC)    Coronary artery disease    Diverticulitis    Diverticulosis    GERD (gastroesophageal reflux disease)    Glaucoma    Heart failure, type unknown (HCC) 03/11/2017   History of radiation therapy 08/05/19- 09/03/19   Right Breast/ SCV 16 fractions of 2.66 Gy each for a total of 42.56 Gy. Right breast boost 4 fractions of 2 Gy each to total 8 Gy.    Hyperlipidemia 03/11/2017   Hypertension    Hypothyroidism    Macular degeneration    wet and dry per pt's son   Personal history of chemotherapy    Personal history of radiation therapy    Pneumonia X 1   years and years ago (03/26/2017)   Shortness of breath 03/11/2017   Status post dilation of esophageal narrowing    Past Surgical History:  Procedure Laterality Date   BILATERAL OOPHORECTOMY Bilateral    BREAST LUMPECTOMY Right 06/19/2019   BREAST LUMPECTOMY WITH RADIOACTIVE SEED AND SENTINEL LYMPH NODE BIOPSY Right 06/19/2019   Procedure: RIGHT BREAST LUMPECTOMY WITH RADIOACTIVE SEED AND SENTINEL LYMPH NODE BIOPSY;  Surgeon: Curvin Deward MOULD, MD;  Location: [facility] OR;  Service: General;  Laterality: Right;   CATARACT EXTRACTION     CATARACT EXTRACTION W/ INTRAOCULAR LENS  IMPLANT, BILATERAL Bilateral    COLOSTOMY     Greater than 10 yrs   CORONARY ANGIOPLASTY WITH STENT PLACEMENT  03/26/2017   LAPAROSCOPIC RIGHT COLECTOMY Right 12/15/2019   Procedure: LAPAROSCOPIC ASSISTED RIGHT COLECTOMY;  Surgeon: Curvin Deward MOULD, MD;  Location: [facility] OR;   Service: General;  Laterality: Right;   LEFT HEART CATH AND CORONARY ANGIOGRAPHY N/A 03/26/2017   Procedure: Left Heart Cath and Coronary Angiography;  Surgeon: Claudene Victory ORN, MD;  Location: [facility] INVASIVE CV LAB;  Service: Cardiovascular;  Laterality: N/A;   NERVE SURGERY     lump on my sciatic nerve was removed years ago   SHOULDER OPEN ROTATOR CUFF REPAIR Right    THYROIDECTOMY, PARTIAL     TONSILLECTOMY     UPPER GASTROINTESTINAL ENDOSCOPY     > ten yrs   FAMILY HISTORY Family History  Problem Relation Age of Onset   CAD Mother    Cataracts Mother    Coronary artery disease Father    Leukemia Sister    Amblyopia Neg Hx    Blindness  Neg Hx    Diabetes Neg Hx    Glaucoma Neg Hx    Macular degeneration Neg Hx    Retinal detachment Neg Hx    Strabismus Neg Hx    Retinitis pigmentosa Neg Hx    Colon cancer Neg Hx    Colon polyps Neg Hx    Esophageal cancer Neg Hx    Rectal cancer Neg Hx    Stomach cancer Neg Hx    Breast cancer Neg Hx    SOCIAL HISTORY Social History   Tobacco Use   Smoking status: Former    Current packs/day: 0.50    Average packs/day: 0.5 packs/day for 60.0 years (30.0 ttl pk-yrs)    Types: Cigarettes   Smokeless tobacco: Never  Vaping Use   Vaping status: Never Used  Substance Use Topics   Alcohol use: Not Currently    Comment: twice a month   Drug use: No       OPHTHALMIC EXAM:  Base Eye Exam     Visual Acuity (Snellen - Linear)       Right Left   Dist [place] CF at 3' CF at 3'   Dist ph [place] NI NI         Tonometry (Tonopen, 1:15 PM)       Right Left   Pressure 14 12         Pupils       Pupils Dark Light Shape React APD   Right PERRL 3 2 Round Brisk None   Left PERRL 3 2 Round Brisk None         Visual Fields       Left Right    Full Full         Extraocular Movement       Right Left    Full, Ortho Full, Ortho         Neuro/Psych     Oriented x3: Yes   Mood/Affect: Normal         Dilation     Both  eyes: 2.5% Phenylephrine  @ 1:14 PM           Slit Lamp and Fundus Exam     Slit Lamp Exam       Right Left   Lids/Lashes Dermatochalasis - upper lid, Telangiectasia, Meibomian gland dysfunction - improving Dermatochalasis - upper lid, Telangiectasia, Meibomian gland dysfunction   Conjunctiva/Sclera White and quiet trace Injection   Cornea Arcus, 2-3+ Punctate epithelial erosions, 3+ fine endo pigment, tear film debris, mild central haze Arcus, 3+ Punctate epithelial erosions, endo pigment / Krunkenberg's spindle, mild central haze   Anterior Chamber Deep and quiet Deep and quiet   Iris Round and dilated Round and dilated   Lens Three piece PC IOL in good position, open PC PC IOL in good position, clear PC   Anterior Vitreous Vitreous syneresis, Posterior vitreous detachment, mild vitreous condensations Mild Vitreous syneresis, Posterior vitreous detachment         Fundus Exam       Right Left   Disc sharp rim, 360 Peripapillary atrophy, 2-3+Pallor 360 Peripapillary atrophy, 2+pallor, sharp rim   C/D Ratio 0.2 0.1   Macula Blunted foveal reflex, Drusen, RPE mottling, clumping and atrophy, +PED, +CNVM, sub-retinal central Disciform scar with subretinal fibrosis, no heme, central pigment clumping Blunted foveal reflex, Drusen, RPE mottling, clumping, and atrophy, focal GA nasal macula extending into fovea, CNV with edema temporal to fovea -- improving, early fibrosis, +IRH   Vessels attenuated,  mild tortuosity attenuated, mild tortuosity   Periphery Attached, reticular degeneration, No heme Attached, reticular degeneration, focal IRH above disc           IMAGING AND PROCEDURES  Imaging and Procedures for @TODAY @  OCT, Retina - OU - Both Eyes       Right Eye Quality was good. Central Foveal Thickness: 339. Progression has been stable. Findings include no IRF, no SRF, abnormal foveal contour, retinal drusen , subretinal hyper-reflective material, disciform scar, epiretinal  membrane, pigment epithelial detachment, outer retinal atrophy (persistent thick SRHM/disciform scar -- stable from prior, no fluid).   Left Eye Quality was good. Central Foveal Thickness: 297. Progression has been stable. Findings include no IRF, no SRF, abnormal foveal contour, retinal drusen , subretinal hyper-reflective material, pigment epithelial detachment, outer retinal atrophy (stable improvement in PED/CNV and SRHM edema temporal significant central GA / ORA ).   Notes *Images captured and stored on drive  Diagnosis / Impression:  OD: Exudative AMD with significant submacular scar/SRHM --  persistent thick SRHM/disciform scar -- no IRF/SRF, no fluid OS: stable improvement in PED/CNV, SRHM and edema temporal to significant central GA / ORA   Clinical management:  See below  Abbreviations: NFP - Normal foveal profile. CME - cystoid macular edema. PED - pigment epithelial detachment. IRF - intraretinal fluid. SRF - subretinal fluid. EZ - ellipsoid zone. ERM - epiretinal membrane. ORA - outer retinal atrophy. ORT - outer retinal tubulation. SRHM - subretinal hyper-reflective material       Intravitreal Injection, Pharmacologic Agent - OS - Left Eye       Time Out 06/23/2024. 1:52 PM. Confirmed correct patient, procedure, site, and patient consented.   Anesthesia Topical anesthesia was used. Anesthetic medications included Lidocaine  2%, Proparacaine 0.5%.   Procedure Preparation included 5% betadine  to ocular surface, eyelid speculum. A supplied needle was used.   Injection: 1.25 mg Bevacizumab  1.25mg /0.26ml   Route: Intravitreal, Site: Left Eye   NDC: 49757-939-98, Lot: [id number], Expiration date: 07/06/2024   Post-op Post injection exam found visual acuity of at least counting fingers. The patient tolerated the procedure well. There were no complications. The patient received written and verbal post procedure care education. Post injection medications were not given.             ASSESSMENT/PLAN:    ICD-10-CM   1. 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 Exudative age-related macular degeneration of left eye with active choroidal neovascularization (HCC)  H35.3221 OCT, Retina - OU - Both Eyes    Intravitreal Injection, Pharmacologic Agent - OS - Left Eye    Bevacizumab  (AVASTIN ) SOLN 1.25 mg    3. Posterior vitreous detachment of both eyes  H43.813     4. Pigmentary glaucoma of both eyes, mild stage  H40.1331     5. Pseudophakia of both eyes  Z96.1     6. Dry eyes  H04.123      1. Exudative age related macular degeneration, OD -- inactive CNV / scar - onset ~2 mos prior to presentation, waited for scheduled appt with Dr. Lelon  - S/P IVA OD #1 (05.20.19), #2 (07.02.19), #3 (07.30.19)  ==================== - S/P IVE OD #1 (08.27.19), #2 (09.24.19), #3 (11.05.19), #4 (12.17.19), #5 (02.21.20), #6 (05.05.20), # 7 (08.05.20), #8 (9.2.20), #9 (11.10.20), #10 (02.12.21), #11 (05.12.21), #12 (08.13.21)  - VA remains CF - OCT with persistent thick /SRHM/disciform scar centrally  -  discussed findings and guarded prognosis and treatment options - due to stable, inactive submacular scar and VA CF -- recommend holding off on injection today  - pt in agreement  2. Exudative age related macular degeneration, left eye - interval conversion from nonexudative ARMD noted on 01.10.25 visit  -- pt was lost to retina f/u from Jan 2024 to Jan 2025 - referred back here by Dr. Fleeta - s/p IVA OS #1 (01.10.25), #2 (02.10.25), #3 (03.24.25), #4 (05.19.25), #5 (08.11.25)  - BCVA OS: stable at CF 3'  - OCT shows stable improvement in PED/CNV, SRHM and edema temporal to significant central GA / ORA at 12 weeks - recommend IVA today OS #6 (10.20.25) with f/u in 10 weeks - pt wishes to proceed with injection - RBA of procedure discussed, questions answered - Avastin  consent obtained, signed,  and scanned 01.10.25 - see procedure note   - f/u 10 weeks, DFE, OCT, possible injection  3. PVD / vitreous syneresis OU  - Discussed findings and prognosis  - No RT or RD on 360 peripheral exam  - Reviewed s/s of RT/RD  - strict return precautions for any such RT/RD symptoms  4. Pigmentary glaucoma OU  - using latanoprost  OU qhs / brimonidine  BID OU  - IOP good today -- 08,10  - monitor  5. Pseudophakia OU  - s/p CE/IOL OU  - beautiful surgery, doing well  - monitor  6. Dry eyes OU  - recommend artificial tears and lubricating ointment as needed  - monitor  Ophthalmic Meds Ordered this visit:  Meds ordered this encounter  Medications   Bevacizumab  (AVASTIN ) SOLN 1.25 mg     Return in about 10 weeks (around 09/01/2024) for f/u, Ex. AMD, DFE, OCT, Possible, IVA, OS.  There are no Patient Instructions on file for this visit.  This document serves as a record of services personally performed by Redell JUDITHANN Hans, MD, PhD. It was created on their behalf by Avelina Pereyra, COA an ophthalmic technician. The creation of this record is the provider's dictation and/or activities during the visit.   Electronically signed by: Avelina GORMAN Pereyra, COT  06/23/24  10:18 PM    This document serves as a record of services personally performed by Redell JUDITHANN Hans, MD, PhD. It was created on their behalf by Wanda GEANNIE Keens, COT an ophthalmic technician. The creation of this record is the provider's dictation and/or activities during the visit.    Electronically signed by:  Wanda GEANNIE Keens, COT  06/23/24 10:18 PM  Redell JUDITHANN Hans, M.D., Ph.D. Diseases & Surgery of the Retina and Vitreous [facility]    Abbreviations: M myopia (nearsighted); A astigmatism; H hyperopia (farsighted); P presbyopia; Mrx spectacle prescription;  CTL contact lenses; OD right eye; OS left eye; OU both eyes  XT exotropia; ET esotropia; PEK punctate epithelial keratitis; PEE punctate  epithelial erosions; DES dry eye syndrome; MGD meibomian gland dysfunction; ATs artificial tears; PFAT's preservative free artificial tears; NSC nuclear sclerotic cataract; PSC posterior subcapsular cataract; ERM epi-retinal membrane; PVD posterior vitreous detachment; RD retinal detachment; DM diabetes mellitus; DR diabetic retinopathy; NPDR non-proliferative diabetic retinopathy; PDR proliferative diabetic retinopathy; CSME clinically significant macular edema; DME diabetic macular edema; dbh dot blot hemorrhages; CWS cotton wool spot; POAG primary open angle glaucoma; C/D cup-to-disc ratio; HVF humphrey visual field; GVF goldmann visual field; OCT optical coherence tomography; IOP intraocular pressure; BRVO Branch retinal vein occlusion; CRVO central retinal vein occlusion; CRAO central retinal artery occlusion;  BRAO branch retinal artery occlusion; RT retinal tear; SB scleral buckle; PPV pars plana vitrectomy; VH Vitreous hemorrhage; PRP panretinal laser photocoagulation; IVK intravitreal kenalog; VMT vitreomacular traction; MH Macular hole;  NVD neovascularization of the disc; NVE neovascularization elsewhere; AREDS age related eye disease study; ARMD age related macular degeneration; POAG primary open angle glaucoma; EBMD epithelial/anterior basement membrane dystrophy; ACIOL anterior chamber intraocular lens; IOL intraocular lens; PCIOL posterior chamber intraocular lens; Phaco/IOL phacoemulsification with intraocular lens placement; PRK photorefractive keratectomy; LASIK laser assisted in situ keratomileusis; HTN hypertension; DM diabetes mellitus; COPD chronic obstructive pulmonary disease

## 2024-06-23 ENCOUNTER — Ambulatory Visit (INDEPENDENT_AMBULATORY_CARE_PROVIDER_SITE_OTHER): Admitting: Ophthalmology

## 2024-06-23 ENCOUNTER — Encounter (INDEPENDENT_AMBULATORY_CARE_PROVIDER_SITE_OTHER): Payer: Self-pay | Admitting: Ophthalmology

## 2024-06-23 DIAGNOSIS — H43813 Vitreous degeneration, bilateral: Secondary | ICD-10-CM | POA: Diagnosis not present

## 2024-06-23 DIAGNOSIS — H353221 Exudative age-related macular degeneration, left eye, with active choroidal neovascularization: Secondary | ICD-10-CM

## 2024-06-23 DIAGNOSIS — H04123 Dry eye syndrome of bilateral lacrimal glands: Secondary | ICD-10-CM

## 2024-06-23 DIAGNOSIS — Z961 Presence of intraocular lens: Secondary | ICD-10-CM

## 2024-06-23 DIAGNOSIS — H401331 Pigmentary glaucoma, bilateral, mild stage: Secondary | ICD-10-CM

## 2024-06-23 DIAGNOSIS — H353213 Exudative age-related macular degeneration, right eye, with inactive scar: Secondary | ICD-10-CM

## 2024-06-23 MED ORDER — BEVACIZUMAB CHEMO INJECTION 1.25MG/0.05ML SYRINGE FOR KALEIDOSCOPE
1.2500 mg | INTRAVITREAL | Status: AC | PRN
  Administered 2024-06-23: 1.25 mg via INTRAVITREAL

## 2024-07-03 DIAGNOSIS — E039 Hypothyroidism, unspecified: Secondary | ICD-10-CM | POA: Diagnosis not present

## 2024-07-24 DIAGNOSIS — R399 Unspecified symptoms and signs involving the genitourinary system: Secondary | ICD-10-CM | POA: Diagnosis not present

## 2024-07-24 DIAGNOSIS — R300 Dysuria: Secondary | ICD-10-CM | POA: Diagnosis not present

## 2024-07-31 ENCOUNTER — Ambulatory Visit
Admission: RE | Admit: 2024-07-31 | Discharge: 2024-07-31 | Disposition: A | Discharge status: Home / Self Care | Source: Ambulatory Visit | Attending: Pulmonary Disease | Admitting: Pulmonary Disease

## 2024-07-31 DIAGNOSIS — R911 Solitary pulmonary nodule: Secondary | ICD-10-CM

## 2024-07-31 DIAGNOSIS — J439 Emphysema, unspecified: Secondary | ICD-10-CM | POA: Diagnosis not present

## 2024-08-06 DIAGNOSIS — J449 Chronic obstructive pulmonary disease, unspecified: Secondary | ICD-10-CM | POA: Diagnosis not present

## 2024-08-07 DIAGNOSIS — M810 Age-related osteoporosis without current pathological fracture: Secondary | ICD-10-CM | POA: Diagnosis not present

## 2024-08-12 ENCOUNTER — Ambulatory Visit: Payer: Self-pay | Admitting: Pulmonary Disease

## 2024-08-12 DIAGNOSIS — R911 Solitary pulmonary nodule: Secondary | ICD-10-CM

## 2024-08-13 ENCOUNTER — Telehealth: Payer: Self-pay | Admitting: *Deleted

## 2024-08-13 NOTE — Telephone Encounter (Signed)
 Copied from CRM #[id number]. Topic: Clinical - Lab/Test Results >> Aug 13, 2024  9:28 AM Benton KIDD wrote: Reason for CRM: patient son is returning call they received for patient concerning results . Please give them a call back [phone]  Result Note CT shows improvement in lung formation with some small new lung nodules that we will continue to monitor.  I have ordered a follow-up CT in 3 months. CT CHEST WO CONTRAST Moody Rancher, [facility]    08/13/24  9:26 AM Result Note Lvm to call back CT CHEST WO CO  Spoke with the pt's son and notified of results/recs per Dr. Theophilus. He verbalized understanding. Nothing further needed.

## 2024-08-25 NOTE — Progress Notes (Signed)
 " [facility] - Clinic Note  09/01/2024     CHIEF COMPLAINT Patient presents for Retina Follow Up    HISTORY OF PRESENT ILLNESS: Marissa Caldwell is a 89 y.o. female who presents to the clinic today for:   HPI     Retina Follow Up   Patient presents with  Wet AMD.  In both eyes.  This started 10 weeks ago.  Duration of 10 weeks.  Since onset it is stable.  I, the attending physician,  performed the HPI with the patient and updated documentation appropriately.        Comments   10 week retina follow up AMD and IVA OS pt has been having more headaches the last few weeks she denies any vision changes noticed she denies flashes or floaters       Last edited by Valdemar Rogue, MD on 09/01/2024  9:42 PM.     Pt states vision is stable. She is having pain going through her head.  Referring physician:  Teresa Channel, MD [phone] [place],  [place] [address]  HISTORICAL INFORMATION:   Selected notes from the MEDICAL RECORD NUMBER Referred by Dr. Medford Ferrier for concern of exudative ARMD OD   CURRENT MEDICATIONS: Current Outpatient Medications (Ophthalmic Drugs)  Medication Sig   brimonidine -timolol (COMBIGAN) 0.2-0.5 % ophthalmic solution Apply to eye.   latanoprost  (XALATAN ) 0.005 % ophthalmic solution Place 1 drop into both eyes at bedtime.   No current facility-administered medications for this visit. (Ophthalmic Drugs)   Current Outpatient Medications (Other)  Medication Sig   albuterol  (PROVENTIL  HFA;VENTOLIN  HFA) 108 (90 Base) MCG/ACT inhaler Inhale 2 puffs into the lungs every 6 (six) hours as needed for wheezing or shortness of breath.   amLODipine  (NORVASC ) 5 MG tablet Take 1 tablet (5 mg total) by mouth daily.   arformoterol  (BROVANA ) 15 MCG/2ML NEBU Take 2 mLs (15 mcg total) by nebulization 2 (two) times daily.   budesonide  (PULMICORT ) 0.25 MG/2ML nebulizer solution Take 2 mLs (0.25 mg total) by nebulization 2 (two) times daily.    Calcium  Carb-Cholecalciferol  (CALCIUM  + D3) 600-200 MG-UNIT TABS Take 1 tablet by mouth daily.   Cholecalciferol  (VITAMIN D3) 2000 units capsule Take 4,000 Units by mouth daily.   Coenzyme Q10 (COQ10) 100 MG CAPS Take 100 mg by mouth daily.   diclofenac  (CATAFLAM ) 50 MG tablet Take 1 tablet (50 mg total) by mouth daily after breakfast.   famotidine  (PEPCID ) 20 MG tablet Take 20 mg by mouth daily.   gabapentin  (NEURONTIN ) 100 MG capsule Take 1 capsule (100 mg total) by mouth at bedtime.   guaiFENesin  (MUCINEX ) 600 MG 12 hr tablet Take 2 tablets (1,200 mg total) by mouth 2 (two) times daily.   ipratropium-albuterol  (DUONEB) 0.5-2.5 (3) MG/3ML SOLN Take 3 mLs by nebulization every 6 (six) hours as needed.   levothyroxine  (SYNTHROID , LEVOTHROID) 100 MCG tablet Take 100 mcg by mouth daily before breakfast.    LORazepam  (ATIVAN ) 0.5 MG tablet Take 0.25-0.5 mg by mouth every 8 (eight) hours as needed for anxiety.   LUTEIN  PO Take 1 tablet by mouth daily.   Multiple Vitamin (MULTIVITAMIN) tablet Take 1 tablet by mouth daily.   nitroGLYCERIN  (NITROSTAT ) 0.4 MG SL tablet Place 1 tablet (0.4 mg total) under the tongue every 5 (five) minutes as needed for chest pain.   Omega-3 Fatty Acids (FISH OIL PO) Take 1 capsule by mouth daily.   pravastatin  (PRAVACHOL ) 40 MG tablet TAKE 1 TABLET  BY MOUTH DAILY GENERIC EQUIVALENT FOR PRAVACHOL    solifenacin (VESICARE) 5 MG tablet Take 5 mg by mouth daily.   tamoxifen  (NOLVADEX ) 20 MG tablet TAKE 1 TABLET BY MOUTH ONCE DAILY. APPOINTMENT REQUIRED FOR FUTURE REFILLS   traMADol  (ULTRAM ) 50 MG tablet Take 0.5-1 tablets (25-50 mg total) by mouth every 8 (eight) hours as needed.   No current facility-administered medications for this visit. (Other)   REVIEW OF SYSTEMS: ROS   Positive for: HENT, Endocrine, Cardiovascular, Eyes, Respiratory, Heme/Lymph Negative for: Constitutional, Gastrointestinal, Neurological, Skin, Genitourinary, Musculoskeletal, Psychiatric,  Allergic/Imm Last edited by Resa Delon ORN, COT on 09/01/2024  1:19 PM.     ALLERGIES Allergies  Allergen Reactions   Lipitor [Atorvastatin ]     Elevated LFT's   PAST MEDICAL HISTORY Past Medical History:  Diagnosis Date   Anxiety    Arthritis    Breast cancer (HCC)    CAD (coronary artery disease)    a. 03/2017: 95% LCx stenosis --> PCI/DES placed   Cataract    Chest pain 03/27/2017   Chronic diastolic heart failure (HCC) 06/25/2017   Chronic lower back pain    Colon cancer (HCC) 11/2019   s/p colectomy   COPD (chronic obstructive pulmonary disease) (HCC)    Coronary artery disease    Diverticulitis    Diverticulosis    GERD (gastroesophageal reflux disease)    Glaucoma    Heart failure, type unknown (HCC) 03/11/2017   History of radiation therapy 08/05/19- 09/03/19   Right Breast/ SCV 16 fractions of 2.66 Gy each for a total of 42.56 Gy. Right breast boost 4 fractions of 2 Gy each to total 8 Gy.    Hyperlipidemia 03/11/2017   Hypertension    Hypothyroidism    Macular degeneration    wet and dry per pt's son   Personal history of chemotherapy    Personal history of radiation therapy    Pneumonia X 1   years and years ago (03/26/2017)   Shortness of breath 03/11/2017   Status post dilation of esophageal narrowing    Past Surgical History:  Procedure Laterality Date   BILATERAL OOPHORECTOMY Bilateral    BREAST LUMPECTOMY Right 06/19/2019   BREAST LUMPECTOMY WITH RADIOACTIVE SEED AND SENTINEL LYMPH NODE BIOPSY Right 06/19/2019   Procedure: RIGHT BREAST LUMPECTOMY WITH RADIOACTIVE SEED AND SENTINEL LYMPH NODE BIOPSY;  Surgeon: Curvin Deward MOULD, MD;  Location: [facility] OR;  Service: General;  Laterality: Right;   CATARACT EXTRACTION     CATARACT EXTRACTION W/ INTRAOCULAR LENS  IMPLANT, BILATERAL Bilateral    COLOSTOMY     Greater than 10 yrs   CORONARY ANGIOPLASTY WITH STENT PLACEMENT  03/26/2017   LAPAROSCOPIC RIGHT COLECTOMY Right 12/15/2019   Procedure: LAPAROSCOPIC  ASSISTED RIGHT COLECTOMY;  Surgeon: Curvin Deward MOULD, MD;  Location: [facility] OR;  Service: General;  Laterality: Right;   LEFT HEART CATH AND CORONARY ANGIOGRAPHY N/A 03/26/2017   Procedure: Left Heart Cath and Coronary Angiography;  Surgeon: Claudene Victory ORN, MD;  Location: [facility] INVASIVE CV LAB;  Service: Cardiovascular;  Laterality: N/A;   NERVE SURGERY     lump on my sciatic nerve was removed years ago   SHOULDER OPEN ROTATOR CUFF REPAIR Right    THYROIDECTOMY, PARTIAL     TONSILLECTOMY     UPPER GASTROINTESTINAL ENDOSCOPY     > ten yrs   FAMILY HISTORY Family History  Problem Relation Age of Onset   CAD Mother    Cataracts Mother    Coronary artery disease Father  Leukemia Sister    Amblyopia Neg Hx    Blindness Neg Hx    Diabetes Neg Hx    Glaucoma Neg Hx    Macular degeneration Neg Hx    Retinal detachment Neg Hx    Strabismus Neg Hx    Retinitis pigmentosa Neg Hx    Colon cancer Neg Hx    Colon polyps Neg Hx    Esophageal cancer Neg Hx    Rectal cancer Neg Hx    Stomach cancer Neg Hx    Breast cancer Neg Hx    SOCIAL HISTORY Social History   Tobacco Use   Smoking status: Former    Current packs/day: 0.50    Average packs/day: 0.5 packs/day for 60.0 years (30.0 ttl pk-yrs)    Types: Cigarettes   Smokeless tobacco: Never  Vaping Use   Vaping status: Never Used  Substance Use Topics   Alcohol use: Not Currently    Comment: twice a month   Drug use: No       OPHTHALMIC EXAM:  Base Eye Exam     Visual Acuity (Snellen - Linear)       Right Left   Dist [place] CF at 3' CF at 3'   Dist ph [place] NI NI         Tonometry (Tonopen, 1:23 PM)       Right Left   Pressure 6 8         Pupils       Pupils Dark Light Shape React APD   Right PERRL 3 2 Round Brisk None   Left PERRL 3 2 Round Brisk None         Visual Fields       Left Right    Full Full         Extraocular Movement       Right Left    Full, Ortho Full, Ortho         Neuro/Psych      Oriented x3: Yes   Mood/Affect: Normal         Dilation     Both eyes: 2.5% Phenylephrine  @ 1:23 PM           Slit Lamp and Fundus Exam     Slit Lamp Exam       Right Left   Lids/Lashes Dermatochalasis - upper lid, Telangiectasia, Meibomian gland dysfunction - improving Dermatochalasis - upper lid, Telangiectasia, Meibomian gland dysfunction   Conjunctiva/Sclera White and quiet trace Injection   Cornea Arcus, 2-3+ Punctate epithelial erosions, 3+ fine endo pigment, tear film debris, mild central haze Arcus, 3+ Punctate epithelial erosions, endo pigment / Krunkenberg's spindle, mild central haze   Anterior Chamber Deep and quiet Deep and quiet   Iris Round and dilated Round and dilated   Lens Three piece PC IOL in good position, open PC PC IOL in good position, clear PC   Anterior Vitreous Vitreous syneresis, Posterior vitreous detachment, mild vitreous condensations Mild Vitreous syneresis, Posterior vitreous detachment         Fundus Exam       Right Left   Disc sharp rim, 360 Peripapillary atrophy, 2-3+Pallor 360 Peripapillary atrophy, 2+pallor, sharp rim   C/D Ratio 0.2 0.1   Macula Blunted foveal reflex, Drusen, RPE mottling, clumping and atrophy, +PED, +CNVM, sub-retinal central Disciform scar with subretinal fibrosis, no heme, central pigment clumping Blunted foveal reflex, Drusen, RPE mottling, clumping, and atrophy, focal GA nasal macula extending into fovea, CNV with new  heme/edema centrally early fibrosis   Vessels attenuated, mild tortuosity attenuated, mild tortuosity   Periphery Attached, reticular degeneration, No heme Attached, reticular degeneration, focal IRH above disc           IMAGING AND PROCEDURES  Imaging and Procedures for @TODAY @  OCT, Retina - OU - Both Eyes       Right Eye Quality was good. Central Foveal Thickness: 272. Progression has been stable. Findings include no IRF, no SRF, abnormal foveal contour, retinal drusen , subretinal  hyper-reflective material, disciform scar, epiretinal membrane, pigment epithelial detachment, outer retinal atrophy (persistent thick SRHM/disciform scar -- stable from prior, no fluid).   Left Eye Quality was good. Central Foveal Thickness: 471. Progression has worsened. Findings include no IRF, no SRF, abnormal foveal contour, retinal drusen , subretinal hyper-reflective material, pigment epithelial detachment, outer retinal atrophy (Mild interval increase in PED/CNV and SRHM/edema nasal mac and fovea, persistent central GA / ORA ).   Notes *Images captured and stored on drive  Diagnosis / Impression:  OD: Exudative AMD with significant submacular scar/SRHM --  persistent thick SRHM/disciform scar -- no IRF/SRF, no fluid OS: Mild interval increase in PED/CNV and SRHM/edema nasal mac and fovea, persistent central GA / ORA   Clinical management:  See below  Abbreviations: NFP - Normal foveal profile. CME - cystoid macular edema. PED - pigment epithelial detachment. IRF - intraretinal fluid. SRF - subretinal fluid. EZ - ellipsoid zone. ERM - epiretinal membrane. ORA - outer retinal atrophy. ORT - outer retinal tubulation. SRHM - subretinal hyper-reflective material       Intravitreal Injection, Pharmacologic Agent - OS - Left Eye       Time Out 09/01/2024. 2:25 PM. Confirmed correct patient, procedure, site, and patient consented.   Anesthesia Topical anesthesia was used. Anesthetic medications included Lidocaine  2%, Proparacaine 0.5%.   Procedure Preparation included 5% betadine  to ocular surface, eyelid speculum. A supplied needle was used.   Injection: 1.25 mg Bevacizumab  1.25mg /0.82ml   Route: Intravitreal, Site: Left Eye   NDC: 49757-939-98, Lot: [id number]$ , Expiration date: 09/11/2024   Post-op Post injection exam found visual acuity of at least counting fingers. The patient tolerated the procedure well. There were no complications. The patient received written and verbal  post procedure care education. Post injection medications were not given.            ASSESSMENT/PLAN:    ICD-10-CM   1. 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 Exudative age-related macular degeneration of left eye with active choroidal neovascularization (HCC)  H35.3221 Intravitreal Injection, Pharmacologic Agent - OS - Left Eye    Bevacizumab  (AVASTIN ) SOLN 1.25 mg    3. Posterior vitreous detachment of both eyes  H43.813     4. Pigmentary glaucoma of both eyes, mild stage  H40.1331     5. Pseudophakia of both eyes  Z96.1     6. Dry eyes  H04.123      1. Exudative age related macular degeneration, OD -- inactive CNV / scar - onset ~2 mos prior to presentation, waited for scheduled appt with Dr. Lelon  - S/P IVA OD #1 (05.20.19), #2 (07.02.19), #3 (07.30.19)  ==================== - S/P IVE OD #1 (08.27.19), #2 (09.24.19), #3 (11.05.19), #4 (12.17.19), #5 (02.21.20), #6 (05.05.20), # 7 (08.05.20), #8 (9.2.20), #9 (11.10.20), #10 (02.12.21), #11 (05.12.21), #12 (08.13.21)  - VA remains CF - OCT with persistent thick/SRHM/disciform scar centrally  -  discussed findings and guarded prognosis and treatment options - due to stable, inactive submacular scar and VA CF, recommend holding off on injection today  - pt in agreement   2. Exudative age related macular degeneration, left eye - interval conversion from nonexudative ARMD noted on 01.10.25 visit  -- pt was lost to retina f/u from Jan 2024 to Jan 2025 - referred back here by Dr. Fleeta - s/p IVA OS #1 (01.10.25), #2 (02.10.25), #3 (03.24.25), #4 (05.19.25), #5 (08.11.25), #6 (10.20.25)  - BCVA OS: stable at CF 3'  - OCT shows Mild interval increase in PED/CNV and SRHM/edema nasal mac and fovea, persistent central GA / ORA at 10 weeks - exam shows increased intraretinal hemorrhages in macula -- active CNV - recommend IVA today OS #7 (01.12.26)  with f/u in 6-8 weeks - pt wishes to proceed with injection - RBA of procedure discussed, questions answered - Avastin  consent obtained, signed, and scanned 01.10.25 - see procedure note   - f/u 6-8 weeks, DFE, OCT, possible injection   3. PVD / vitreous syneresis OU  - Discussed findings and prognosis  - No RT or RD on 360 peripheral exam  - Reviewed s/s of RT/RD  - strict return precautions for any such RT/RD symptoms   4. Pigmentary glaucoma OU  - using latanoprost  OU qhs / brimonidine  BID OU  - IOP good today -- 08,10  - monitor   5. Pseudophakia OU  - s/p CE/IOL OU  - beautiful surgery, doing well  - monitor   6. Dry eyes OU  - recommend artificial tears and lubricating ointment as needed  - monitor   Ophthalmic Meds Ordered this visit:  Meds ordered this encounter  Medications   Bevacizumab  (AVASTIN ) SOLN 1.25 mg     Return in about 6 weeks (around 10/13/2024) for f/u, Ex. AMD, DFE, OCT, Possible, IVA, OS.  There are no Patient Instructions on file for this visit.  This document serves as a record of services personally performed by Redell JUDITHANN Hans, MD, PhD. It was created on their behalf by Paulina Jamse Gay an ophthalmic technician. The creation of this record is the provider's dictation and/or activities during the visit.   Electronically signed by: Alana D Fowler  09/01/2024  9:45 PM   This document serves as a record of services personally performed by Redell JUDITHANN Hans, MD, PhD. It was created on their behalf by Wanda GEANNIE Keens, COT an ophthalmic technician. The creation of this record is the provider's dictation and/or activities during the visit.    Electronically signed by:  Wanda GEANNIE Keens, COT  09/01/2024 9:45 PM  Redell JUDITHANN Hans, M.D., Ph.D. Diseases & Surgery of the Retina and Vitreous [facility]  I have reviewed the above documentation for accuracy and completeness, and I agree with the above. Redell JUDITHANN Hans, M.D.,  Ph.D. 09/01/2024 9:49 PM   Abbreviations: M myopia (nearsighted); A astigmatism; H hyperopia (farsighted); P presbyopia; Mrx spectacle prescription;  CTL contact lenses; OD right eye; OS left eye; OU both eyes  XT exotropia; ET esotropia; PEK punctate epithelial keratitis; PEE punctate epithelial erosions; DES dry eye syndrome; MGD meibomian gland dysfunction; ATs artificial tears; PFAT's preservative free artificial tears; NSC nuclear sclerotic cataract; PSC posterior subcapsular cataract; ERM epi-retinal membrane; PVD posterior vitreous detachment; RD retinal detachment; DM diabetes mellitus; DR diabetic retinopathy; NPDR non-proliferative diabetic retinopathy; PDR proliferative diabetic retinopathy; CSME clinically significant macular edema; DME diabetic macular edema; dbh dot blot hemorrhages;  CWS cotton wool spot; POAG primary open angle glaucoma; C/D cup-to-disc ratio; HVF humphrey visual field; GVF goldmann visual field; OCT optical coherence tomography; IOP intraocular pressure; BRVO Branch retinal vein occlusion; CRVO central retinal vein occlusion; CRAO central retinal artery occlusion; BRAO branch retinal artery occlusion; RT retinal tear; SB scleral buckle; PPV pars plana vitrectomy; VH Vitreous hemorrhage; PRP panretinal laser photocoagulation; IVK intravitreal kenalog; VMT vitreomacular traction; MH Macular hole;  NVD neovascularization of the disc; NVE neovascularization elsewhere; AREDS age related eye disease study; ARMD age related macular degeneration; POAG primary open angle glaucoma; EBMD epithelial/anterior basement membrane dystrophy; ACIOL anterior chamber intraocular lens; IOL intraocular lens; PCIOL posterior chamber intraocular lens; Phaco/IOL phacoemulsification with intraocular lens placement; PRK photorefractive keratectomy; LASIK laser assisted in situ keratomileusis; HTN hypertension; DM diabetes mellitus; COPD chronic obstructive pulmonary disease "

## 2024-09-01 ENCOUNTER — Encounter (INDEPENDENT_AMBULATORY_CARE_PROVIDER_SITE_OTHER): Payer: Self-pay | Admitting: Ophthalmology

## 2024-09-01 ENCOUNTER — Ambulatory Visit (INDEPENDENT_AMBULATORY_CARE_PROVIDER_SITE_OTHER): Admitting: Ophthalmology

## 2024-09-01 DIAGNOSIS — H353221 Exudative age-related macular degeneration, left eye, with active choroidal neovascularization: Secondary | ICD-10-CM | POA: Diagnosis not present

## 2024-09-01 DIAGNOSIS — H43813 Vitreous degeneration, bilateral: Secondary | ICD-10-CM

## 2024-09-01 DIAGNOSIS — Z961 Presence of intraocular lens: Secondary | ICD-10-CM | POA: Diagnosis not present

## 2024-09-01 DIAGNOSIS — H04123 Dry eye syndrome of bilateral lacrimal glands: Secondary | ICD-10-CM

## 2024-09-01 DIAGNOSIS — H353213 Exudative age-related macular degeneration, right eye, with inactive scar: Secondary | ICD-10-CM | POA: Diagnosis not present

## 2024-09-01 DIAGNOSIS — H401331 Pigmentary glaucoma, bilateral, mild stage: Secondary | ICD-10-CM | POA: Diagnosis not present

## 2024-09-01 MED ORDER — BEVACIZUMAB CHEMO INJECTION 1.25MG/0.05ML SYRINGE FOR KALEIDOSCOPE
1.2500 mg | INTRAVITREAL | Status: AC | PRN
  Administered 2024-09-01: 1.25 mg via INTRAVITREAL

## 2024-09-30 ENCOUNTER — Encounter (INDEPENDENT_AMBULATORY_CARE_PROVIDER_SITE_OTHER): Admitting: Ophthalmology

## 2024-10-13 ENCOUNTER — Encounter (INDEPENDENT_AMBULATORY_CARE_PROVIDER_SITE_OTHER): Admitting: Ophthalmology

## 2024-11-10 ENCOUNTER — Other Ambulatory Visit
